# Patient Record
Sex: Male | Born: 1955
Health system: Southern US, Community
[De-identification: ages and names within clinical notes are randomized; demographics above are authoritative.]

## PROBLEM LIST (undated history)

## (undated) DIAGNOSIS — N2889 Other specified disorders of kidney and ureter: Secondary | ICD-10-CM

## (undated) DIAGNOSIS — Z950 Presence of cardiac pacemaker: Secondary | ICD-10-CM

## (undated) DIAGNOSIS — G250 Essential tremor: Secondary | ICD-10-CM

## (undated) DIAGNOSIS — K219 Gastro-esophageal reflux disease without esophagitis: Secondary | ICD-10-CM

## (undated) DIAGNOSIS — I495 Sick sinus syndrome: Secondary | ICD-10-CM

## (undated) DIAGNOSIS — I7143 Infrarenal abdominal aortic aneurysm, without rupture: Secondary | ICD-10-CM

## (undated) DIAGNOSIS — G709 Myoneural disorder, unspecified: Secondary | ICD-10-CM

## (undated) DIAGNOSIS — I4719 Other supraventricular tachycardia: Secondary | ICD-10-CM

## (undated) DIAGNOSIS — Z87440 Personal history of urinary (tract) infections: Secondary | ICD-10-CM

## (undated) DIAGNOSIS — Z87898 Personal history of other specified conditions: Secondary | ICD-10-CM

## (undated) DIAGNOSIS — F5104 Psychophysiologic insomnia: Secondary | ICD-10-CM

## (undated) DIAGNOSIS — H409 Unspecified glaucoma: Secondary | ICD-10-CM

## (undated) DIAGNOSIS — J189 Pneumonia, unspecified organism: Secondary | ICD-10-CM

## (undated) DIAGNOSIS — R531 Weakness: Secondary | ICD-10-CM

## (undated) DIAGNOSIS — J439 Emphysema, unspecified: Secondary | ICD-10-CM

## (undated) DIAGNOSIS — E785 Hyperlipidemia, unspecified: Secondary | ICD-10-CM

## (undated) DIAGNOSIS — I2510 Atherosclerotic heart disease of native coronary artery without angina pectoris: Secondary | ICD-10-CM

## (undated) DIAGNOSIS — N401 Enlarged prostate with lower urinary tract symptoms: Secondary | ICD-10-CM

## (undated) DIAGNOSIS — E119 Type 2 diabetes mellitus without complications: Secondary | ICD-10-CM

## (undated) DIAGNOSIS — R001 Bradycardia, unspecified: Secondary | ICD-10-CM

## (undated) DIAGNOSIS — R0602 Shortness of breath: Secondary | ICD-10-CM

## (undated) DIAGNOSIS — F1910 Other psychoactive substance abuse, uncomplicated: Secondary | ICD-10-CM

## (undated) DIAGNOSIS — R569 Unspecified convulsions: Secondary | ICD-10-CM

## (undated) DIAGNOSIS — R55 Syncope and collapse: Secondary | ICD-10-CM

## (undated) DIAGNOSIS — I10 Essential (primary) hypertension: Secondary | ICD-10-CM

## (undated) DIAGNOSIS — Z974 Presence of external hearing-aid: Secondary | ICD-10-CM

## (undated) DIAGNOSIS — H348192 Central retinal vein occlusion, unspecified eye, stable: Secondary | ICD-10-CM

## (undated) DIAGNOSIS — F419 Anxiety disorder, unspecified: Secondary | ICD-10-CM

## (undated) DIAGNOSIS — F0390 Unspecified dementia without behavioral disturbance: Secondary | ICD-10-CM

## (undated) DIAGNOSIS — K5730 Diverticulosis of large intestine without perforation or abscess without bleeding: Secondary | ICD-10-CM

## (undated) DIAGNOSIS — I471 Supraventricular tachycardia: Secondary | ICD-10-CM

## (undated) DIAGNOSIS — Z8719 Personal history of other diseases of the digestive system: Secondary | ICD-10-CM

## (undated) DIAGNOSIS — I714 Abdominal aortic aneurysm, without rupture: Secondary | ICD-10-CM

## (undated) DIAGNOSIS — Z8739 Personal history of other diseases of the musculoskeletal system and connective tissue: Secondary | ICD-10-CM

## (undated) DIAGNOSIS — R319 Hematuria, unspecified: Secondary | ICD-10-CM

## (undated) DIAGNOSIS — A419 Sepsis, unspecified organism: Secondary | ICD-10-CM

## (undated) DIAGNOSIS — Z973 Presence of spectacles and contact lenses: Secondary | ICD-10-CM

## (undated) DIAGNOSIS — I219 Acute myocardial infarction, unspecified: Secondary | ICD-10-CM

## (undated) DIAGNOSIS — F32A Depression, unspecified: Secondary | ICD-10-CM

## (undated) DIAGNOSIS — G4730 Sleep apnea, unspecified: Secondary | ICD-10-CM

## (undated) DIAGNOSIS — M1990 Unspecified osteoarthritis, unspecified site: Secondary | ICD-10-CM

## (undated) DIAGNOSIS — F329 Major depressive disorder, single episode, unspecified: Secondary | ICD-10-CM

## (undated) DIAGNOSIS — Z8611 Personal history of tuberculosis: Secondary | ICD-10-CM

## (undated) DIAGNOSIS — A159 Respiratory tuberculosis unspecified: Secondary | ICD-10-CM

## (undated) DIAGNOSIS — C652 Malignant neoplasm of left renal pelvis: Secondary | ICD-10-CM

## (undated) DIAGNOSIS — I6930 Unspecified sequelae of cerebral infarction: Secondary | ICD-10-CM

## (undated) DIAGNOSIS — I639 Cerebral infarction, unspecified: Secondary | ICD-10-CM

## (undated) DIAGNOSIS — R339 Retention of urine, unspecified: Secondary | ICD-10-CM

## (undated) DIAGNOSIS — C801 Malignant (primary) neoplasm, unspecified: Secondary | ICD-10-CM

## (undated) HISTORY — DX: Psychophysiologic insomnia: F51.04

## (undated) HISTORY — PX: ABDOMINAL EXPLORATION SURGERY: SHX538

## (undated) HISTORY — DX: Essential tremor: G25.0

## (undated) HISTORY — PX: CATARACT EXTRACTION W/ INTRAOCULAR LENS  IMPLANT, BILATERAL: SHX1307

## (undated) HISTORY — PX: MULTIPLE TOOTH EXTRACTIONS: SHX2053

## (undated) HISTORY — PX: OTHER SURGICAL HISTORY: SHX169

## (undated) HISTORY — DX: Hyperlipidemia, unspecified: E78.5

## (undated) HISTORY — DX: Respiratory tuberculosis unspecified: A15.9

## (undated) HISTORY — PX: LUNG SURGERY: SHX703

## (undated) HISTORY — PX: CARDIAC CATHETERIZATION: SHX172

## (undated) HISTORY — DX: Acute myocardial infarction, unspecified: I21.9

## (undated) HISTORY — PX: CARDIAC PACEMAKER PLACEMENT: SHX583

## (undated) HISTORY — DX: Shortness of breath: R06.02

## (undated) HISTORY — DX: Presence of cardiac pacemaker: Z95.0

---
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
## 2000-01-19 ENCOUNTER — Emergency Department (HOSPITAL_COMMUNITY): Admission: EM | Admit: 2000-01-19 | Discharge: 2000-01-19 | Payer: Self-pay | Admitting: Emergency Medicine

## 2000-01-19 ENCOUNTER — Encounter: Payer: Self-pay | Admitting: Emergency Medicine

## 2013-12-15 DIAGNOSIS — I219 Acute myocardial infarction, unspecified: Secondary | ICD-10-CM

## 2013-12-15 HISTORY — DX: Acute myocardial infarction, unspecified: I21.9

## 2014-12-11 DIAGNOSIS — Z950 Presence of cardiac pacemaker: Secondary | ICD-10-CM

## 2014-12-11 HISTORY — DX: Presence of cardiac pacemaker: Z95.0

## 2015-10-01 ENCOUNTER — Emergency Department (HOSPITAL_COMMUNITY): Payer: Self-pay

## 2015-10-01 ENCOUNTER — Emergency Department (HOSPITAL_COMMUNITY)
Admission: EM | Admit: 2015-10-01 | Discharge: 2015-10-02 | Disposition: A | Discharge status: Home / Self Care | Payer: Self-pay | Attending: Emergency Medicine | Admitting: Emergency Medicine

## 2015-10-01 ENCOUNTER — Encounter (HOSPITAL_COMMUNITY): Payer: Self-pay | Admitting: Emergency Medicine

## 2015-10-01 DIAGNOSIS — R079 Chest pain, unspecified: Secondary | ICD-10-CM | POA: Insufficient documentation

## 2015-10-01 DIAGNOSIS — Z880 Allergy status to penicillin: Secondary | ICD-10-CM | POA: Insufficient documentation

## 2015-10-01 DIAGNOSIS — Z720 Tobacco use: Secondary | ICD-10-CM | POA: Insufficient documentation

## 2015-10-01 DIAGNOSIS — J441 Chronic obstructive pulmonary disease with (acute) exacerbation: Secondary | ICD-10-CM | POA: Insufficient documentation

## 2015-10-01 DIAGNOSIS — I10 Essential (primary) hypertension: Secondary | ICD-10-CM | POA: Insufficient documentation

## 2015-10-01 DIAGNOSIS — H538 Other visual disturbances: Secondary | ICD-10-CM | POA: Insufficient documentation

## 2015-10-01 DIAGNOSIS — Z950 Presence of cardiac pacemaker: Secondary | ICD-10-CM | POA: Insufficient documentation

## 2015-10-01 DIAGNOSIS — Z9889 Other specified postprocedural states: Secondary | ICD-10-CM | POA: Insufficient documentation

## 2015-10-01 DIAGNOSIS — Z7982 Long term (current) use of aspirin: Secondary | ICD-10-CM | POA: Insufficient documentation

## 2015-10-01 HISTORY — DX: Essential (primary) hypertension: I10

## 2015-10-01 LAB — I-STAT TROPONIN, ED
TROPONIN I, POC: 0 ng/mL (ref 0.00–0.08)
TROPONIN I, POC: 0 ng/mL (ref 0.00–0.08)

## 2015-10-01 LAB — BASIC METABOLIC PANEL
ANION GAP: 8 (ref 5–15)
BUN: 14 mg/dL (ref 6–20)
CALCIUM: 9.4 mg/dL (ref 8.9–10.3)
CO2: 26 mmol/L (ref 22–32)
Chloride: 105 mmol/L (ref 101–111)
Creatinine, Ser: 0.74 mg/dL (ref 0.61–1.24)
GFR calc Af Amer: >60 mL/min (ref >60)
GLUCOSE: 128 mg/dL — AB (ref 65–99)
Potassium: 3.9 mmol/L (ref 3.5–5.1)
Sodium: 139 mmol/L (ref 135–145)

## 2015-10-01 LAB — CBC
HEMATOCRIT: 47.7 % (ref 39.0–52.0)
HEMOGLOBIN: 15.5 g/dL (ref 13.0–17.0)
MCH: 28.9 pg (ref 26.0–34.0)
MCHC: 32.5 g/dL (ref 30.0–36.0)
MCV: 89 fL (ref 78.0–100.0)
Platelets: 189 10*3/uL (ref 150–400)
RBC: 5.36 MIL/uL (ref 4.22–5.81)
RDW: 14.2 % (ref 11.5–15.5)
WBC: 5.9 10*3/uL (ref 4.0–10.5)

## 2015-10-01 LAB — D-DIMER, QUANTITATIVE: D-Dimer, Quant: <0.27 ug/mL-FEU (ref 0.00–0.48)

## 2015-10-01 MED ORDER — LISINOPRIL 20 MG PO TABS: 20.0000 mg | ORAL_TABLET | Freq: Two times a day (BID) | ORAL | Status: DC

## 2015-10-01 MED ORDER — AMLODIPINE BESYLATE 5 MG PO TABS: 5.0000 mg | ORAL_TABLET | Freq: Every day | ORAL | Status: DC

## 2015-10-01 MED ORDER — ALBUTEROL SULFATE HFA 108 (90 BASE) MCG/ACT IN AERS
2.0000 | INHALATION_SPRAY | RESPIRATORY_TRACT | Status: DC | PRN
  Administered 2015-10-01: 2 via RESPIRATORY_TRACT
  Filled 2015-10-01: qty 6.7

## 2015-10-01 NOTE — ED Notes (Signed)
Pt reports sharp intermittent L sided cp for several days that makes his L arm feel weak. Also c.o sob, and bilateral blurred vision.

## 2015-10-01 NOTE — ED Provider Notes (Signed)
CSN: [id number]     Arrival date & time 10/01/15  1919 History   First MD Initiated Contact with Patient 10/01/15 2201     Chief Complaint  Patient presents with  . Chest Pain     (Consider location/radiation/quality/duration/timing/severity/associated sxs/prior Treatment) HPI Comments: Patient presents with chest pain. He has a history of hypertension and COPD. He smokes cigarettes intermittently. He recently moved from [place] about a week ago. He reports that he's had some intermittent sharp pain to his left chest for the last 3-4 days. At times it radiates to his left arm. He has some intermittent blurry vision associated with the pain. He states the sharp pain last about 5-10 minutes. He occasionally gets short of breath with it. It does not seem to be exertional. He traveled former [place] be a train. He denies any pain or swelling in his legs. He states that he ran out of his medications a few days ago and that's when the symptoms seemed to start. He takes Norvasc, lisinopril and an albuterol inhaler as well as baby aspirin at home. He states that he was recently admitted within the last 2 weeks to [facility] where he had a cardiac catheterization that he states was normal. He could not tell me why he had the catheterization. He does have a pacemaker in place that was placed in December of last year. He states the pacemaker placement was complicated with a buildup of blood around his lung resulting in a chest tube. He's not had any ongoing problems since that time.  Patient is a 59 y.o. male presenting with chest pain.  Chest Pain Associated symptoms: shortness of breath   Associated symptoms: no abdominal pain, no back pain, no cough, no diaphoresis, no dizziness, no fatigue, no fever, no headache, no nausea, no numbness, not vomiting and no weakness     Past Medical History  Diagnosis Date  . Hypertension    Past Surgical History  Procedure Laterality Date  .  Pacemaker insertion     No family history on file. Social History  Substance Use Topics  . Smoking status: Current Some Day Smoker  . Smokeless tobacco: None  . Alcohol Use: No    Review of Systems  Constitutional: Negative for fever, chills, diaphoresis and fatigue.  HENT: Negative for congestion, rhinorrhea and sneezing.   Eyes: Positive for visual disturbance.  Respiratory: Positive for shortness of breath. Negative for cough and chest tightness.   Cardiovascular: Positive for chest pain. Negative for leg swelling.  Gastrointestinal: Negative for nausea, vomiting, abdominal pain, diarrhea and blood in stool.  Genitourinary: Negative for frequency, hematuria, flank pain and difficulty urinating.  Musculoskeletal: Negative for back pain and arthralgias.  Skin: Negative for rash.  Neurological: Negative for dizziness, speech difficulty, weakness, numbness and headaches.      Allergies  Penicillins  Home Medications   Prior to Admission medications   Medication Sig Start Date End Date Taking? Authorizing Provider  albuterol (PROVENTIL HFA;VENTOLIN HFA) 108 (90 BASE) MCG/ACT inhaler Inhale 2 puffs into the lungs every 6 (six) hours as needed for wheezing or shortness of breath.    Historical Provider, MD  amLODipine (NORVASC) 5 MG tablet Take 1 tablet (5 mg total) by mouth daily. 10/01/15   Malvin Johns, MD  aspirin 81 MG tablet Take 81 mg by mouth daily.    Historical Provider, MD  lisinopril (PRINIVIL,ZESTRIL) 20 MG tablet Take 1 tablet (20 mg total) by mouth 2 (two) times daily. 10/01/15  Malvin Johns, MD   BP 145/70 mmHg  Pulse 59  Temp(Src) 98 F (36.7 C) (Oral)  Resp 16  Ht 6\' 1"  (1.854 m)  Wt 173 lb (78.472 kg)  BMI 22.83 kg/m2  SpO2 99% Physical Exam  Constitutional: He is oriented to person, place, and time. He appears well-developed and well-nourished.  HENT:  Head: Normocephalic and atraumatic.  Eyes: Pupils are equal, round, and reactive to light.   Neck: Normal range of motion. Neck supple.  Cardiovascular: Normal rate, regular rhythm and normal heart sounds.   Pulmonary/Chest: Effort normal and breath sounds normal. No respiratory distress. He has no wheezes. He has no rales. He exhibits no tenderness.  Abdominal: Soft. Bowel sounds are normal. There is no tenderness. There is no rebound and no guarding.  Musculoskeletal: Normal range of motion. He exhibits no edema.  No edema or calf tenderness  Lymphadenopathy:    He has no cervical adenopathy.  Neurological: He is alert and oriented to person, place, and time.  Skin: Skin is warm and dry. No rash noted.  Psychiatric: He has a normal mood and affect.    ED Course  Procedures (including critical care time) Labs Review Labs Reviewed  BASIC METABOLIC PANEL - Abnormal; Notable for the following:    Glucose, Bld 128 (*)    All other components within normal limits  CBC  D-DIMER, QUANTITATIVE (NOT AT [facility])  Randolm Idol, ED  Randolm Idol, ED    Imaging Review Dg Chest 2 View  10/01/2015  CLINICAL DATA:  Initial evaluation for acute left-sided chest pain. EXAM: CHEST  2 VIEW COMPARISON:  None. FINDINGS: Left-sided pacemaker/ AICD in place with electrodes overlying the right atrium and right ventricle. Heart size is normal. Mediastinal silhouette within normal limits. Lungs are normally inflated. 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 No pneumothorax. No airspace consolidation, pulmonary edema, or pleural effusion. No acute osseus abnormality. IMPRESSION: 1. No active cardiopulmonary disease. 2. Bullous change at the right lung apex. Electronically Signed   By: Jeannine Boga M.D.   On: 10/01/2015 21:08   I have personally reviewed and evaluated these images and lab results as part of my medical decision-making.   EKG Interpretation   Date/Time:  Monday October 01 2015 19:24:57 EDT Ventricular Rate:  60 PR Interval:  164 QRS Duration:  90 QT Interval:  408 QTC Calculation: 408 R Axis:   77 Text Interpretation:  Atrial-paced rhythm Left ventricular hypertrophy  Abnormal ECG No old tracing to compare Confirmed by Toniya Rozar  MD, Abdi Husak  ([id number]) on 10/01/2015 10:46:22 PM      MDM   Final diagnoses:  Chest pain, unspecified chest pain type    Patient presents with sharp intermittent chest pains. His EKG is without ischemic changes. He's had a troponin which was negative. This was actually drawn several hours after he arrived in the ED so I don't feel that he needs a second troponin. His d-dimer is negative and he has no other suggestions of pulmonary embolus. He states he had a normal cardiac catheterization within the last 2 weeks. We attempted to obtain records from [facility] but as of yet have been unsuccessful. Patient is discharged home in good condition. He was given refills on his medications. He was advised to follow-up with a PCP as soon as possible. He was given referral to [place]. Return precautions were given.    Malvin Johns, MD 10/01/15 [phone]

## 2015-10-01 NOTE — Discharge Instructions (Signed)
Nonspecific Chest Pain  °Chest pain can be caused by many different conditions. There is always a chance that your pain could be related to something serious, such as a heart attack or a blood clot in your lungs. Chest pain can also be caused by conditions that are not life-threatening. If you have chest pain, it is very important to follow up with your health care provider. °CAUSES  °Chest pain can be caused by: °· Heartburn. °· Pneumonia or bronchitis. °· Anxiety or stress. °· Inflammation around your heart (pericarditis) or lung (pleuritis or pleurisy). °· A blood clot in your lung. °· A collapsed lung (pneumothorax). It can develop suddenly on its own (spontaneous pneumothorax) or from trauma to the chest. °· Shingles infection (varicella-zoster virus). °· Heart attack. °· Damage to the bones, muscles, and cartilage that make up your chest wall. This can include: °¨ Bruised bones due to injury. °¨ Strained muscles or cartilage due to frequent or repeated coughing or overwork. °¨ Fracture to one or more ribs. °¨ Sore cartilage due to inflammation (costochondritis). °RISK FACTORS  °Risk factors for chest pain may include: °· Activities that increase your risk for trauma or injury to your chest. °· Respiratory infections or conditions that cause frequent coughing. °· Medical conditions or overeating that can cause heartburn. °· Heart disease or family history of heart disease. °· Conditions or health behaviors that increase your risk of developing a blood clot. °· Having had chicken pox (varicella zoster). °SIGNS AND SYMPTOMS °Chest pain can feel like: °· Burning or tingling on the surface of your chest or deep in your chest. °· Crushing, pressure, aching, or squeezing pain. °· Dull or sharp pain that is worse when you move, cough, or take a deep breath. °· Pain that is also felt in your back, neck, shoulder, or arm, or pain that spreads to any of these areas. °Your chest pain may come and go, or it may stay  constant. °DIAGNOSIS °Lab tests or other studies may be needed to find the cause of your pain. Your health care provider may have you take a test called an ambulatory ECG (electrocardiogram). An ECG records your heartbeat patterns at the time the test is performed. You may also have other tests, such as: °· Transthoracic echocardiogram (TTE). During echocardiography, sound waves are used to create a picture of all of the heart structures and to look at how blood flows through your heart. °· Transesophageal echocardiogram (TEE). This is a more advanced imaging test that obtains images from inside your body. It allows your health care provider to see your heart in finer detail. °· Cardiac monitoring. This allows your health care provider to monitor your heart rate and rhythm in real time. °· Holter monitor. This is a portable device that records your heartbeat and can help to diagnose abnormal heartbeats. It allows your health care provider to track your heart activity for several days, if needed. °· Stress tests. These can be done through exercise or by taking medicine that makes your heart beat more quickly. °· Blood tests. °· Imaging tests. °TREATMENT  °Your treatment depends on what is causing your chest pain. Treatment may include: °· Medicines. These may include: °¨ Acid blockers for heartburn. °¨ Anti-inflammatory medicine. °¨ Pain medicine for inflammatory conditions. °¨ Antibiotic medicine, if an infection is present. °¨ Medicines to dissolve blood clots. °¨ Medicines to treat coronary artery disease. °· Supportive care for conditions that do not require medicines. This may include: °¨ Resting. °¨ Applying heat   or cold packs to injured areas. °¨ Limiting activities until pain decreases. °HOME CARE INSTRUCTIONS °· If you were prescribed an antibiotic medicine, finish it all even if you start to feel better. °· Avoid any activities that bring on chest pain. °· Do not use any tobacco products, including  cigarettes, chewing tobacco, or electronic cigarettes. If you need help quitting, ask your health care provider. °· Do not drink alcohol. °· Take medicines only as directed by your health care provider. °· Keep all follow-up visits as directed by your health care provider. This is important. This includes any further testing if your chest pain does not go away. °· If heartburn is the cause for your chest pain, you may be told to keep your head raised (elevated) while sleeping. This reduces the chance that acid will go from your stomach into your esophagus. °· Make lifestyle changes as directed by your health care provider. These may include: °¨ Getting regular exercise. Ask your health care provider to suggest some activities that are safe for you. °¨ Eating a heart-healthy diet. A registered dietitian can help you to learn healthy eating options. °¨ Maintaining a healthy weight. °¨ Managing diabetes, if necessary. °¨ Reducing stress. °SEEK MEDICAL CARE IF: °· Your chest pain does not go away after treatment. °· You have a rash with blisters on your chest. °· You have a fever. °SEEK IMMEDIATE MEDICAL CARE IF:  °· Your chest pain is worse. °· You have an increasing cough, or you cough up blood. °· You have severe abdominal pain. °· You have severe weakness. °· You faint. °· You have chills. °· You have sudden, unexplained chest discomfort. °· You have sudden, unexplained discomfort in your arms, back, neck, or jaw. °· You have shortness of breath at any time. °· You suddenly start to sweat, or your skin gets clammy. °· You feel nauseous or you vomit. °· You suddenly feel light-headed or dizzy. °· Your heart begins to beat quickly, or it feels like it is skipping beats. °These symptoms may represent a serious problem that is an emergency. Do not wait to see if the symptoms will go away. Get medical help right away. Call your local emergency services (911 in the [place] Do not drive yourself to the hospital. °  °This  information is not intended to replace advice given to you by your health care provider. Make sure you discuss any questions you have with your health care provider. °  °Document Released: 09/10/2005 Document Revised: 12/22/2014 Document Reviewed: 07/07/2014 °Elsevier Interactive Patient Education ©2016 [employer] ° °

## 2015-10-01 NOTE — ED Notes (Signed)
Discharge instructions/prescriptions reviewed with patient. Understanding verbalized. Patient declined wheelchair at time of discharge. Denies pain. No distress noted at time of discharge.

## 2015-10-04 ENCOUNTER — Encounter: Payer: Self-pay | Admitting: Family Medicine

## 2015-10-04 ENCOUNTER — Ambulatory Visit: Payer: Medicaid Other | Attending: Family Medicine | Admitting: Family Medicine

## 2015-10-04 VITALS — BP 146/92 | HR 61 | Temp 98.3°F | Resp 18 | Ht 73.5 in | Wt 171.0 lb

## 2015-10-04 DIAGNOSIS — Z880 Allergy status to penicillin: Secondary | ICD-10-CM | POA: Insufficient documentation

## 2015-10-04 DIAGNOSIS — Z79899 Other long term (current) drug therapy: Secondary | ICD-10-CM | POA: Diagnosis not present

## 2015-10-04 DIAGNOSIS — R079 Chest pain, unspecified: Secondary | ICD-10-CM | POA: Diagnosis present

## 2015-10-04 DIAGNOSIS — R3911 Hesitancy of micturition: Secondary | ICD-10-CM | POA: Diagnosis not present

## 2015-10-04 DIAGNOSIS — Z7982 Long term (current) use of aspirin: Secondary | ICD-10-CM | POA: Diagnosis not present

## 2015-10-04 DIAGNOSIS — I10 Essential (primary) hypertension: Secondary | ICD-10-CM | POA: Insufficient documentation

## 2015-10-04 DIAGNOSIS — Z23 Encounter for immunization: Secondary | ICD-10-CM | POA: Diagnosis not present

## 2015-10-04 DIAGNOSIS — Z0000 Encounter for general adult medical examination without abnormal findings: Secondary | ICD-10-CM | POA: Diagnosis not present

## 2015-10-04 DIAGNOSIS — F17200 Nicotine dependence, unspecified, uncomplicated: Secondary | ICD-10-CM | POA: Insufficient documentation

## 2015-10-04 DIAGNOSIS — Z950 Presence of cardiac pacemaker: Secondary | ICD-10-CM | POA: Diagnosis not present

## 2015-10-04 DIAGNOSIS — F17210 Nicotine dependence, cigarettes, uncomplicated: Secondary | ICD-10-CM | POA: Diagnosis not present

## 2015-10-04 DIAGNOSIS — J449 Chronic obstructive pulmonary disease, unspecified: Secondary | ICD-10-CM | POA: Insufficient documentation

## 2015-10-04 MED ORDER — AMLODIPINE BESYLATE 5 MG PO TABS: 5.0000 mg | ORAL_TABLET | Freq: Every day | ORAL | Status: DC

## 2015-10-04 MED ORDER — LISINOPRIL 20 MG PO TABS: 20.0000 mg | ORAL_TABLET | Freq: Two times a day (BID) | ORAL | Status: DC

## 2015-10-04 MED ORDER — ASPIRIN 81 MG PO TABS: 81.0000 mg | ORAL_TABLET | Freq: Every day | ORAL | Status: DC

## 2015-10-04 NOTE — Progress Notes (Signed)
Pt's here ED f/up for chest pain, his pace maker, and HTN.   Pt also wants to esc.care with PCP.  Pt needs referral to opthamology, GI, and Otologist.   Pt has not taken meds today due to financial hardship.  Pt requested flu vaccine today.  Pt waited 10 mins w/o no adverse reaction to injection.

## 2015-10-04 NOTE — Progress Notes (Signed)
CC: ED follow-up for chest pain.  HPI: Wesley [name] is a 59 y.o. male with a history of COPD, hypertension, history of pacemaker placement in 11/2014 in [place] who comes in here for an ED follow-up from 10/01/15 after he had presented with chest pains and had admitted to running out of his medications.  His workup at the ED included  troponin which came back negative, EKG was negative for evidence of ischemia, he had normal d-dimer, chest x-ray revealed no active cardiopulmonary disease, bullous change at the right lung apex. The patient had also informed the ED physician that he had a recent cardiac cath while in [place] which was normal as per patient but indication was not known but postop course was complicated with ?blood around his lung necessitating chest tube placement. ED attempts at obtaining previous records were unsuccessful and he was discharged to follow-up with the clinic here.  Interval history: Today he informs me he has no chest pain but is yet to obtain his medications due to financial constraints. He complains of urinary hesitancy and straining but denies frequency and nocturia. He had also informed the nurse he needed a GI referral and on further questioning he tells me he was previously constipated anastomosis bowels well. He also has visual problems and would like to be referred to an ophthalmologist.   Patient has No headache, No chest pain, No abdominal pain - No Nausea, No new weakness tingling or numbness, No Cough - SOB.  Allergies  Allergen Reactions  . Penicillins Swelling   Past Medical History  Diagnosis Date  . Hypertension    Current Outpatient Prescriptions on File Prior to Visit  Medication Sig Dispense Refill  . albuterol (PROVENTIL HFA;VENTOLIN HFA) 108 (90 BASE) MCG/ACT inhaler Inhale 2 puffs into the lungs every 6 (six) hours as needed for wheezing or shortness of breath.    Marland Kitchen amLODipine (NORVASC) 5 MG tablet Take 1 tablet (5 mg total)  by mouth daily. 30 tablet 0  . aspirin 81 MG tablet Take 81 mg by mouth daily.    Marland Kitchen lisinopril (PRINIVIL,ZESTRIL) 20 MG tablet Take 1 tablet (20 mg total) by mouth 2 (two) times daily. 60 tablet 0   No current facility-administered medications on file prior to visit.   Family History  Problem Relation Age of Onset  . Cancer Mother   . Cancer Father    Social History   Social History  . Marital Status: Married    Spouse Name: N/A  . Number of Children: N/A  . Years of Education: N/A   Occupational History  . Not on file.   Social History Main Topics  . Smoking status: Current Some Day Smoker -- 0.50 packs/day    Types: Cigarettes  . Smokeless tobacco: Not on file  . Alcohol Use: No  . Drug Use: No  . Sexual Activity: Not on file   Other Topics Concern  . Not on file   Social History Narrative    Review of Systems: Constitutional: Negative for fever, chills, diaphoresis, activity change, appetite change and fatigue. HENT: Negative for ear pain, nosebleeds, congestion, facial swelling, rhinorrhea, neck pain, neck stiffness and ear discharge.  Eyes: Negative for pain, discharge, redness, itching and positive for visual disturbance. Respiratory: Negative for cough, choking, chest tightness, shortness of breath, wheezing and stridor.  Cardiovascular: Negative for chest pain, palpitations and leg swelling. Gastrointestinal: Negative for abdominal distention. Genitourinary:see hpi  Musculoskeletal: Negative for back pain, joint swelling, arthralgias and gait problem.  Neurological: Negative for dizziness, tremors, seizures, syncope, facial asymmetry, speech difficulty, weakness, light-headedness, numbness and headaches.  Hematological: Negative for adenopathy. Does not bruise/bleed easily. Psychiatric/Behavioral: Negative for hallucinations, behavioral problems, confusion, dysphoric mood, decreased concentration and agitation.    Objective:   Filed Vitals:   10/04/15 0935    BP: 146/92  Pulse: 61  Temp: 98.3 F (36.8 C)  Resp: 18    Physical Exam: Constitutional: Patient appears well-developed and well-nourished. No distress. HENT: Normocephalic, atraumatic, External right and left ear normal. Oropharynx is clear and moist.  Eyes: Conjunctivae and EOM are normal. PERRLA, no scleral icterus. Neck: Normal ROM. Neck supple. No JVD. No tracheal deviation. No thyromegaly. CVS: RRR, S1/S2 +, no murmurs, no gallops, no carotid bruit; pacemaker in left upper chest wall. Pulmonary: Effort and breath sounds normal, no stridor, rhonchi, wheezes, rales.  Abdominal: Soft. BS +,  no distension, tenderness, rebound or guarding.  Musculoskeletal: Normal range of motion. No edema and no tenderness.  Lymphadenopathy: No lymphadenopathy noted, cervical, inguinal or axillary Neuro: Alert. Normal reflexes, muscle tone coordination. No cranial nerve deficit. Skin: Skin is warm and dry. No rash noted. Not diaphoretic. No erythema. No pallor. Psychiatric: Normal mood and affect. Behavior, judgment, thought content normal.  Lab Results  Component Value Date   WBC 5.9 10/01/2015   HGB 15.5 10/01/2015   HCT 47.7 10/01/2015   MCV 89.0 10/01/2015   PLT 189 10/01/2015   Lab Results  Component Value Date   CREATININE 0.74 10/01/2015   BUN 14 10/01/2015   NA 139 10/01/2015   K 3.9 10/01/2015   CL 105 10/01/2015   CO2 26 10/01/2015       Assessment and plan:  Hypertension: Uncontrolled (goal is <140/90) due to missing dose of antihypertensives He does have financial constraints and so I have sent his medications to the pharmacy on site. Compliance emphasized as well as low-sodium, DASH diet.  Status post pacemaker placement: He does not have a current cardiologist and so I will be referring him to Dr. wall for optimization of management. I am requesting records from [place], [place] as we have no detailed previous medical history and the patient is unsure if he has a  history of CHF or coronary artery disease. I am holding off on beta blocker due to his heart rate being on the low side of normal.  Urinary hesitancy: I am suspecting benign prostatic hyperplasia but was send off a PSA to exclude malignancy and if normal will proceed to place him on Flomax.  COPD: Controlled on just Proventil HFA no frequent exacerbations.  Tobacco abuse: Smoking cessation support: smoking cessation hotline: 1-800-QUIT-NOW.  Smoking cessation classes are available through [facility]. Call [phone] or visit our website at [email or web address]  Spent 4 minutes counseling on smoking cessation and patient is working on quitting    I have informed him to work on the process of his [employer] card and [employer] as this will facilitate referral to the specialist he is requesting to see.  Arnoldo Morale, [place] [facility] [phone] 10/04/2015, 10:06 AM

## 2015-10-04 NOTE — Patient Instructions (Signed)
Hypertension Hypertension, commonly called high blood pressure, is when the force of blood pumping through your arteries is too strong. Your arteries are the blood vessels that carry blood from your heart throughout your body. A blood pressure reading consists of a higher number over a lower number, such as 110/72. The higher number (systolic) is the pressure inside your arteries when your heart pumps. The lower number (diastolic) is the pressure inside your arteries when your heart relaxes. Ideally you want your blood pressure below 120/80. Hypertension forces your heart to work harder to pump blood. Your arteries may become narrow or stiff. Having untreated or uncontrolled hypertension can cause heart attack, stroke, kidney disease, and other problems. RISK FACTORS Some risk factors for high blood pressure are controllable. Others are not.  Risk factors you cannot control include:   Race. You may be at higher risk if you are African American.  Age. Risk increases with age.  Gender. Men are at higher risk than women before age 45 years. After age 65, women are at higher risk than men. Risk factors you can control include:  Not getting enough exercise or physical activity.  Being overweight.  Getting too much fat, sugar, calories, or salt in your diet.  Drinking too much alcohol. SIGNS AND SYMPTOMS Hypertension does not usually cause signs or symptoms. Extremely high blood pressure (hypertensive crisis) may cause headache, anxiety, shortness of breath, and nosebleed. DIAGNOSIS To check if you have hypertension, your health care provider will measure your blood pressure while you are seated, with your arm held at the level of your heart. It should be measured at least twice using the same arm. Certain conditions can cause a difference in blood pressure between your right and left arms. A blood pressure reading that is higher than normal on one occasion does not mean that you need treatment. If  it is not clear whether you have high blood pressure, you may be asked to return on a different day to have your blood pressure checked again. Or, you may be asked to monitor your blood pressure at home for 1 or more weeks. TREATMENT Treating high blood pressure includes making lifestyle changes and possibly taking medicine. Living a healthy lifestyle can help lower high blood pressure. You may need to change some of your habits. Lifestyle changes may include:  Following the DASH diet. This diet is high in fruits, vegetables, and whole grains. It is low in salt, red meat, and added sugars.  Keep your sodium intake below 2,300 mg per day.  Getting at least 30-45 minutes of aerobic exercise at least 4 times per week.  Losing weight if necessary.  Not smoking.  Limiting alcoholic beverages.  Learning ways to reduce stress. Your health care provider may prescribe medicine if lifestyle changes are not enough to get your blood pressure under control, and if one of the following is true:  You are 18-59 years of age and your systolic blood pressure is above 140.  You are 60 years of age or older, and your systolic blood pressure is above 150.  Your diastolic blood pressure is above 90.  You have diabetes, and your systolic blood pressure is over 140 or your diastolic blood pressure is over 90.  You have kidney disease and your blood pressure is above 140/90.  You have heart disease and your blood pressure is above 140/90. Your personal target blood pressure may vary depending on your medical conditions, your age, and other factors. HOME CARE INSTRUCTIONS    Have your blood pressure rechecked as directed by your health care provider.   Take medicines only as directed by your health care provider. Follow the directions carefully. Blood pressure medicines must be taken as prescribed. The medicine does not work as well when you skip doses. Skipping doses also puts you at risk for  problems.  Do not smoke.   Monitor your blood pressure at home as directed by your health care provider. SEEK MEDICAL CARE IF:   You think you are having a reaction to medicines taken.  You have recurrent headaches or feel dizzy.  You have swelling in your ankles.  You have trouble with your vision. SEEK IMMEDIATE MEDICAL CARE IF:  You develop a severe headache or confusion.  You have unusual weakness, numbness, or feel faint.  You have severe chest or abdominal pain.  You vomit repeatedly.  You have trouble breathing. MAKE SURE YOU:   Understand these instructions.  Will watch your condition.  Will get help right away if you are not doing well or get worse.   This information is not intended to replace advice given to you by your health care provider. Make sure you discuss any questions you have with your health care provider.   Document Released: 12/01/2005 Document Revised: 04/17/2015 Document Reviewed: 09/23/2013 Elsevier Interactive Patient Education 2016 Elsevier Inc.  

## 2015-10-05 ENCOUNTER — Other Ambulatory Visit: Payer: Self-pay | Admitting: Family Medicine

## 2015-10-05 LAB — PSA: PSA: 2.9 ng/mL (ref <=4.00)

## 2015-10-05 MED ORDER — TAMSULOSIN HCL 0.4 MG PO CAPS: 0.4000 mg | ORAL_CAPSULE | Freq: Every day | ORAL | Status: DC

## 2015-10-08 ENCOUNTER — Encounter (HOSPITAL_BASED_OUTPATIENT_CLINIC_OR_DEPARTMENT_OTHER): Payer: Self-pay | Admitting: Clinical

## 2015-10-08 ENCOUNTER — Ambulatory Visit: Payer: Self-pay | Attending: Internal Medicine

## 2015-10-08 DIAGNOSIS — Z659 Problem related to unspecified psychosocial circumstances: Secondary | ICD-10-CM

## 2015-10-08 NOTE — Progress Notes (Signed)
ASSESSMENT: Pt currently experiencing psychosocial problems related to life circumstances; he would benefit from supportive counseling and community resources.  Stage of Change: contemplative  PLAN: 1. F/U with behavioral health consultant in as needed 2. Psychiatric Medications: none. 3. Behavioral recommendation(s):   -Continue positive outlook -Consider scheduling [place] appointment with [facility] -Consider [facility] for housing application, as needed  SUBJECTIVE: Pt. referred by financial counseling  for community resources:  Pt. reports the following symptoms/concerns: Pt states that he is attempting to obtain orange card, had Medicaid in [place], is currently receiving food stamps, moved back in with wife 3 weeks ago (was living homeless), uncertain if she will continue to allow him to stay, he feels much stress over recent life changes; he keeps a positive outlook on life, and would like to come back and talk more as "talking about it all helps a lot".  Duration of problem: three weeks Severity: mild/moderat  OBJECTIVE: Orientation & Cognition: Oriented x3. Thought processes normal and appropriate to situation. Mood: appropriate. Affect: appropriate Appearance: appropriate Risk of harm to self or others: no known risk of harm to self or others Substance use: none known Assessments administered: none  Diagnosis: Problems related to psychosocial circumstances CPT Code: Z65.9 -------------------------------------------- Other(s) present in the room: none  Time spent with patient in exam room: 15 minutes

## 2015-10-10 ENCOUNTER — Encounter: Payer: Self-pay | Admitting: Cardiology

## 2015-10-10 ENCOUNTER — Ambulatory Visit: Payer: Medicaid Other | Attending: Cardiology | Admitting: Cardiology

## 2015-10-10 VITALS — BP 153/88 | HR 66 | Temp 97.6°F | Resp 18 | Ht 73.5 in | Wt 173.7 lb

## 2015-10-10 DIAGNOSIS — F17210 Nicotine dependence, cigarettes, uncomplicated: Secondary | ICD-10-CM | POA: Insufficient documentation

## 2015-10-10 DIAGNOSIS — Z880 Allergy status to penicillin: Secondary | ICD-10-CM | POA: Diagnosis not present

## 2015-10-10 DIAGNOSIS — Z7982 Long term (current) use of aspirin: Secondary | ICD-10-CM | POA: Diagnosis not present

## 2015-10-10 DIAGNOSIS — R001 Bradycardia, unspecified: Secondary | ICD-10-CM | POA: Insufficient documentation

## 2015-10-10 DIAGNOSIS — Z950 Presence of cardiac pacemaker: Secondary | ICD-10-CM | POA: Insufficient documentation

## 2015-10-10 DIAGNOSIS — I10 Essential (primary) hypertension: Secondary | ICD-10-CM | POA: Diagnosis not present

## 2015-10-10 DIAGNOSIS — Z79899 Other long term (current) drug therapy: Secondary | ICD-10-CM | POA: Insufficient documentation

## 2015-10-10 MED ORDER — AMLODIPINE BESYLATE 10 MG PO TABS: 10.0000 mg | ORAL_TABLET | Freq: Every day | ORAL | Status: DC

## 2015-10-10 NOTE — Assessment & Plan Note (Signed)
His pacemaker appears to be stable with generator intact in the left subclavicular space. EKG shows atrial pacing. Will arrange for him to have a pacemaker check in the pacer clinic at [facility]. I will see him back in one year.

## 2015-10-10 NOTE — Progress Notes (Signed)
Patient referred by [name] for HTN and optimization of management of pace maker. Patient has had pace maker for about 10 months now. Patient denies any pain today and has taken his medications today and does not need any refills. Patient reports he smokes but has only had about 2 cigarettes in the last four weeks.   Patient is experiencing SOB when he is active. Reports he can only be up and active for about 10 minutes before he has to sit down.   Patient reports some wheezing when he gets tired.   Patient reports chest pain but says it is not stabbing pain. Pain described as something pulling. Pain come and go. Pain comes on when is relaxed and then goes to move.   Patient denies any swelling.
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
## 2015-10-10 NOTE — Patient Instructions (Addendum)
Thank you for visiting with Dr. Verl Blalock today. Discontinue Amlodipine 5 mg (1 tablet a day) today Begin taking Amlodipine 10 mg ( 1 tablet a day) tomorrow

## 2015-10-10 NOTE — Assessment & Plan Note (Signed)
Not a goal. We will increase his amlodipine to 10 mg by mouth every morning.

## 2015-10-11 ENCOUNTER — Telehealth: Payer: Self-pay

## 2015-10-11 NOTE — Telephone Encounter (Signed)
CMA called Wesley [name], Wesley [name] was not available to take my call. A message was left for the Wesley [name] to return my call asap.

## 2015-10-11 NOTE — Telephone Encounter (Signed)
-----   Message from Arnoldo Morale, MD sent at 10/05/2015 11:00 PM EDT ----- PSA is normal and i have sent a script for Flomax to his pharmacy.

## 2015-10-11 NOTE — Telephone Encounter (Signed)
Pt. Returned call. Please f/u with pt. °

## 2015-10-12 ENCOUNTER — Ambulatory Visit: Payer: Self-pay | Attending: Family Medicine

## 2015-10-12 NOTE — Telephone Encounter (Signed)
-----   Message from Arnoldo Morale, MD sent at 10/05/2015 11:00 PM EDT ----- PSA is normal and i have sent a script for Flomax to his pharmacy.

## 2015-10-12 NOTE — Telephone Encounter (Signed)
CMA called pt, pt verified name and DOB. Pt was given lab results and verbalized that he understood with no further questions.

## 2015-10-24 ENCOUNTER — Encounter: Payer: Self-pay | Admitting: Cardiovascular Disease

## 2015-10-24 ENCOUNTER — Ambulatory Visit (INDEPENDENT_AMBULATORY_CARE_PROVIDER_SITE_OTHER): Payer: Medicaid Other | Admitting: Cardiovascular Disease

## 2015-10-24 VITALS — BP 130/60 | HR 60 | Ht 74.0 in | Wt 182.7 lb

## 2015-10-24 DIAGNOSIS — I10 Essential (primary) hypertension: Secondary | ICD-10-CM

## 2015-10-24 DIAGNOSIS — I495 Sick sinus syndrome: Secondary | ICD-10-CM

## 2015-10-24 DIAGNOSIS — Z950 Presence of cardiac pacemaker: Secondary | ICD-10-CM

## 2015-10-24 NOTE — Progress Notes (Signed)
Patient ID: Wesley [name], male   DOB: 1956/03/31, 59 y.o.   MRN: [id number]     Cardiology Office Note   Date:  10/26/2015   ID:  Wesley [name], DOB [birth year], MRN [id number]  PCP:  No PCP Per Patient  Cardiologist:   Sanda Klein, MD   Chief Complaint  Patient presents with  . Numbness    LEFT ARM   . Shaking    R & L HANDS  . Fatigue    IN KNEES  . Cold Exposure    ALWAYS COLD      History of Present Illness: Wesley [name] is a 59 y.o. male who presents for  Establishment of pacemaker follow-up, referred by Dr. Verl Blalock. He recently moved here from [place]. He reports having a "heart attack" earlier this year , "may be in August". She describes having a heart catheterization that did not show any blockages.   he can't really tell me why he received his pacemaker. It is a Sport and exercise psychologist. Jude dual-chamber device implanted in December 2015. All lead parameters her good and the estimated generator longevity is 10.7 years. Atrial pacing occurs 46% of the time and ventricular pacing only occurs 1% of the time. He is aware of the auto capture and this has been turned off. There have been no episodes of high ventricular rates and he had one very short 3 second episode of paroxysmal atrial tachycardia in January 2016, none since. He thinks he has a remote transmitter, but is not entirely sure where it is.   he talks very little and it is hard to get a real review of systems. He does not appear to have a lot of problems with dyspnea , angina, dizziness, edema or claudication. He denies syncope. He has some pain in his left chest which may be associated with sequelae from a hemothorax that complicated his pacemaker insertion.   he has systemic hypertension and his amlodipine was increased to 10 mg by Dr. Verl Blalock on October 26. Today his blood pressure is normal. He filled   he filled out our screening tool for obstructive sleep apnea and scores 24 points on the Epworth Sleepiness Scale, very  strongly suggestive of obstructive sleep apnea  Past Medical History  Diagnosis Date  . Hypertension     Past Surgical History  Procedure Laterality Date  . Pacemaker insertion       Current Outpatient Prescriptions  Medication Sig Dispense Refill  . albuterol (PROVENTIL HFA;VENTOLIN HFA) 108 (90 BASE) MCG/ACT inhaler Inhale 2 puffs into the lungs every 6 (six) hours as needed for wheezing or shortness of breath.    Marland Kitchen amLODipine (NORVASC) 10 MG tablet Take 1 tablet (10 mg total) by mouth daily. 90 tablet 11  . aspirin 81 MG tablet Take 1 tablet (81 mg total) by mouth daily. 30 tablet 2  . lisinopril (PRINIVIL,ZESTRIL) 20 MG tablet Take 1 tablet (20 mg total) by mouth 2 (two) times daily. 60 tablet 2  . tamsulosin (FLOMAX) 0.4 MG CAPS capsule Take 1 capsule (0.4 mg total) by mouth daily. 30 capsule 3   No current facility-administered medications for this visit.    Allergies:   Penicillins    Social History:  The patient  reports that he has been smoking Cigarettes.  He has been smoking about 0.25 packs per day. He does not have any smokeless tobacco history on file. He reports that he does not drink alcohol or use illicit drugs.   Family History:  The  patient's family history includes Cancer in his father and mother.    ROS:  Please see the history of present illness.    Otherwise, review of systems positive for none.   All other systems are reviewed and negative.    PHYSICAL EXAM: VS:  BP 130/60 mmHg  Pulse 60  Ht 6\' 2"  (1.88 m)  Wt 182 lb 11.2 oz (82.872 kg)  BMI 23.45 kg/m2 , BMI Body mass index is 23.45 kg/(m^2).  General: Alert, oriented x3, no distress Head: no evidence of trauma, PERRL, EOMI, no exophtalmos or lid lag, no myxedema, no xanthelasma; normal ears, nose and oropharynx Neck: normal jugular venous pulsations and no hepatojugular reflux; brisk carotid pulses without delay and no carotid bruits Chest: clear to auscultation, no signs of consolidation by  percussion or palpation, normal fremitus, symmetrical and full respiratory excursions,  LC left subclavian pacemaker site Cardiovascular: normal position and quality of the apical impulse, regular rhythm, normal first and second heart sounds, no murmurs, rubs or gallops Abdomen: no tenderness or distention, no masses by palpation, no abnormal pulsatility or arterial bruits, normal bowel sounds, no hepatosplenomegaly Extremities: no clubbing, cyanosis or edema; 2+ radial, ulnar and brachial pulses bilaterally; 2+ right femoral, posterior tibial and dorsalis pedis pulses; 2+ left femoral, posterior tibial and dorsalis pedis pulses; no subclavian or femoral bruits Neurological: grossly nonfocal Psych: euthymic mood, full affect   EKG:  EKG is not ordered today. The  Device interrogation today demonstrates  Atrial paced ventricular sensed rhythm   Recent Labs: 10/01/2015: BUN 14; Creatinine, Ser 0.74; Hemoglobin 15.5; Platelets 189; Potassium 3.9; Sodium 139    Lipid Panel No results found for: CHOL, TRIG, HDL, CHOLHDL, VLDL, LDLCALC, LDLDIRECT    Wt Readings from Last 3 Encounters:  10/24/15 182 lb 11.2 oz (82.872 kg)  10/10/15 173 lb 11.2 oz (78.79 kg)  10/04/15 171 lb (77.565 kg)     ASSESSMENT AND PLAN:  1.  Sinus bradycardia, status post pacemaker implantation. Normal device function. Discussed remote monitoring via the [employer] system in detail.  We'll have automatic downloads every 3 months and office visit yearly  2.  Hypertension well controlled  3.  Probable obstructive sleep apnea.  Despite the fact that he is not obese and that his neck does not appear to be particularly thick and is oropharynx not particularly crowded , his symptoms are quite suggestive of this disorder. This might explain why he needed a pacemaker for bradycardia at age 10. He needs to undergo a sleep study.  This will have to be arranged through his primary care clinic.    Current medicines are reviewed  at length with the patient today.  The patient does not have concerns regarding medicines.  The following changes have been made:  no change  Labs/ tests ordered today include:  No orders of the defined types were placed in this encounter.     Patient Instructions  Remote monitoring is used to monitor your Pacemaker of ICD from home. This monitoring reduces the number of office visits required to check your device to one time per year. It allows [place] to keep an eye on the functioning of your device to ensure it is working properly. You are scheduled for a device check from home on 02/02/16. You may send your transmission at any time that day. If you have a wireless device, the transmission will be sent automatically. After your physician reviews your transmission, you will receive a postcard with your next transmission date.  Your physician wants you to follow-up in: [place] will receive a reminder letter in the mail two months in advance. If you don't receive a letter, please call our office to schedule the follow-up appointment.        Mikael Spray, MD  10/26/2015 6:37 PM    Sanda Klein, MD, [facility] [phone] office [phone] pager

## 2015-10-24 NOTE — Patient Instructions (Signed)
Remote monitoring is used to monitor your Pacemaker of ICD from home. This monitoring reduces the number of office visits required to check your device to one time per year. It allows [place] to keep an eye on the functioning of your device to ensure it is working properly. You are scheduled for a device check from home on 02/02/16. You may send your transmission at any time that day. If you have a wireless device, the transmission will be sent automatically. After your physician reviews your transmission, you will receive a postcard with your next transmission date.   Your physician wants you to follow-up in: [place] will receive a reminder letter in the mail two months in advance. If you don't receive a letter, please call our office to schedule the follow-up appointment.

## 2015-10-30 LAB — CUP PACEART INCLINIC DEVICE CHECK
Battery Remaining Longevity: 108 mo
Battery Voltage: 3.02 V
Brady Statistic RA Percent Paced: 46 %
Brady Statistic RV Percent Paced: 1 %
Implantable Lead Implant Date: 20151228
Implantable Lead Implant Date: 20151228
Implantable Lead Location: 753859
Lead Channel Pacing Threshold Amplitude: 0.5 V
Lead Channel Pacing Threshold Pulse Width: 0.4 ms
Lead Channel Sensing Intrinsic Amplitude: 3.3 mV
Lead Channel Setting Pacing Amplitude: 2.5 V
Lead Channel Setting Pacing Pulse Width: 0.4 ms
MDC IDC LEAD LOCATION: 753860
MDC IDC MSMT LEADCHNL RA IMPEDANCE VALUE: 540 Ohm
MDC IDC MSMT LEADCHNL RV IMPEDANCE VALUE: 480 Ohm
MDC IDC MSMT LEADCHNL RV PACING THRESHOLD AMPLITUDE: 0.75 V
MDC IDC MSMT LEADCHNL RV PACING THRESHOLD PULSEWIDTH: 0.4 ms
MDC IDC MSMT LEADCHNL RV SENSING INTR AMPL: 3.2 mV
MDC IDC PG SERIAL: 7699241
MDC IDC SESS DTM: 20161115125210
MDC IDC SET LEADCHNL RA PACING AMPLITUDE: 2 V

## 2015-11-01 ENCOUNTER — Encounter (HOSPITAL_BASED_OUTPATIENT_CLINIC_OR_DEPARTMENT_OTHER): Payer: Self-pay | Admitting: Clinical

## 2015-11-01 ENCOUNTER — Other Ambulatory Visit: Payer: Self-pay | Admitting: Family Medicine

## 2015-11-01 ENCOUNTER — Ambulatory Visit: Payer: Medicaid Other | Attending: Family Medicine | Admitting: Family Medicine

## 2015-11-01 ENCOUNTER — Encounter: Payer: Self-pay | Admitting: Family Medicine

## 2015-11-01 VITALS — BP 112/74 | HR 60 | Temp 98.6°F | Resp 16 | Ht 74.0 in | Wt 181.0 lb

## 2015-11-01 DIAGNOSIS — F418 Other specified anxiety disorders: Secondary | ICD-10-CM

## 2015-11-01 DIAGNOSIS — Z0000 Encounter for general adult medical examination without abnormal findings: Secondary | ICD-10-CM

## 2015-11-01 DIAGNOSIS — K0889 Other specified disorders of teeth and supporting structures: Secondary | ICD-10-CM | POA: Diagnosis not present

## 2015-11-01 DIAGNOSIS — H538 Other visual disturbances: Secondary | ICD-10-CM | POA: Diagnosis not present

## 2015-11-01 DIAGNOSIS — J449 Chronic obstructive pulmonary disease, unspecified: Secondary | ICD-10-CM | POA: Diagnosis not present

## 2015-11-01 DIAGNOSIS — Z1159 Encounter for screening for other viral diseases: Secondary | ICD-10-CM

## 2015-11-01 DIAGNOSIS — F419 Anxiety disorder, unspecified: Principal | ICD-10-CM

## 2015-11-01 DIAGNOSIS — I10 Essential (primary) hypertension: Secondary | ICD-10-CM | POA: Diagnosis not present

## 2015-11-01 DIAGNOSIS — F17210 Nicotine dependence, cigarettes, uncomplicated: Secondary | ICD-10-CM | POA: Diagnosis not present

## 2015-11-01 DIAGNOSIS — Z79899 Other long term (current) drug therapy: Secondary | ICD-10-CM | POA: Diagnosis not present

## 2015-11-01 DIAGNOSIS — Z114 Encounter for screening for human immunodeficiency virus [HIV]: Secondary | ICD-10-CM

## 2015-11-01 DIAGNOSIS — F32A Depression, unspecified: Secondary | ICD-10-CM

## 2015-11-01 DIAGNOSIS — R42 Dizziness and giddiness: Secondary | ICD-10-CM

## 2015-11-01 DIAGNOSIS — F329 Major depressive disorder, single episode, unspecified: Secondary | ICD-10-CM

## 2015-11-01 DIAGNOSIS — R202 Paresthesia of skin: Secondary | ICD-10-CM | POA: Insufficient documentation

## 2015-11-01 DIAGNOSIS — F17200 Nicotine dependence, unspecified, uncomplicated: Secondary | ICD-10-CM

## 2015-11-01 DIAGNOSIS — H269 Unspecified cataract: Secondary | ICD-10-CM | POA: Diagnosis not present

## 2015-11-01 DIAGNOSIS — E559 Vitamin D deficiency, unspecified: Secondary | ICD-10-CM

## 2015-11-01 DIAGNOSIS — Z7982 Long term (current) use of aspirin: Secondary | ICD-10-CM | POA: Insufficient documentation

## 2015-11-01 LAB — POCT GLYCOSYLATED HEMOGLOBIN (HGB A1C): Hemoglobin A1C: 6.3

## 2015-11-01 LAB — HEPATITIS C ANTIBODY: HCV Ab: NEGATIVE

## 2015-11-01 LAB — TSH: TSH: 1.352 u[IU]/mL (ref 0.350–4.500)

## 2015-11-01 LAB — VITAMIN B12: VITAMIN B 12: 424 pg/mL (ref 211–911)

## 2015-11-01 LAB — GLUCOSE, POCT (MANUAL RESULT ENTRY): POC GLUCOSE: 91 mg/dL (ref 70–99)

## 2015-11-01 MED ORDER — SERTRALINE HCL 25 MG PO TABS: 25.0000 mg | ORAL_TABLET | Freq: Every day | ORAL | Status: DC

## 2015-11-01 MED ORDER — TIOTROPIUM BROMIDE MONOHYDRATE 18 MCG IN CAPS: 18.0000 ug | ORAL_CAPSULE | Freq: Every day | RESPIRATORY_TRACT | Status: DC

## 2015-11-01 NOTE — Progress Notes (Signed)
C/C SHOB, low energy, problems with vision No tobacco user  No suicide thought in the past two week

## 2015-11-01 NOTE — Progress Notes (Signed)
ASSESSMENT: Pt currently experiencing symptoms of anxiety and depression. Pt needs to f/u with PCP and [facility], and would benefit from supportive counseling regarding coping with symptoms of anxiety and depression.  Stage of Change: contemplative  PLAN: 1. F/U with behavioral health consultant in as needed 2. Psychiatric Medications: Zoloft, starting today 3. Behavioral recommendation(s):   -Consider adding relaxation breathing exercises, as practiced in office visit -Continue putting together daily plan with checklist -Continue with meditation/yoga/prayer -Continue to keep positive outlook   SUBJECTIVE: Pt. referred by Dr Adrian Blackwater for symptoms of anxiety and depression:  Pt. reports the following symptoms/concerns: Pt states that he is used to working 12-18 hours/day and that he has a lot of time to himself now. He is starting to put together a daily checklist, so that he feels he has accomplished something each day, and does his "own kind of meditation, yoga, and prayer" when he is alone, but gets irritated and stressed when he is around a lot of people.  Duration of problem: about 2 months Severity: moderate  OBJECTIVE: Orientation & Cognition: Oriented x3. Thought processes normal and appropriate to situation. Mood: appropriate. Affect: appropriate Appearance: appropriate Risk of harm to self or others: no known risk of harm to self or others Substance use: tobacco Assessments administered: PHQ9: 15/ GAD7: 18  Diagnosis: Anxiety and depression CPT Code: F41.8 -------------------------------------------- Other(s) present in the room: none  Time spent with patient in exam room: 20 minutes

## 2015-11-01 NOTE — Patient Instructions (Addendum)
Diagnoses and all orders for this visit:  Cataract -     Ambulatory referral to Ophthalmology  Pain of molar -     Ambulatory referral to Dentistry  Anxiety and depression -     Vitamin D, 25-hydroxy -     sertraline (ZOLOFT) 25 MG tablet; Take 1 tablet (25 mg total) by mouth daily.  Tingling in extremities -     TSH -     HgB A1c -     Glucose (CBG) -     Vitamin D, 25-hydroxy -     Vitamin B12  Dizziness  Tobacco use disorder  COPD mixed type (HCC) -     tiotropium (SPIRIVA HANDIHALER) 18 MCG inhalation capsule; Place 1 capsule (18 mcg total) into inhaler and inhale daily.   You do not have anemia, last Hbg 15 done last month  Smoking cessation support: smoking cessation hotline: 1-800-QUIT-NOW.  Smoking cessation classes are available through [facility]. Call [phone] or visit our website at [email or web address]  You have depression and anxiety as well as significant COPD  Start zoloft 25 mg daily this is low dose antidepresant Counseling services available at [facility] of [place], [place] and [place].    F/u in 4 weeks for COPD and depression   Dr. Adrian Blackwater

## 2015-11-01 NOTE — Progress Notes (Signed)
Patient ID: Wesley [name], male   DOB: 06-10-1956, 59 y.o.   MRN: [id number]  Subjective:  Patient ID: Wesley [name], male    DOB: 06-04-1956  Age: 59 y.o. MRN: [id number]  CC: Establish Care; Depression; and Blurred Vision   HPI Wesley [name] presents to establish care, he has multiple complaints, the following was addressed today   1. Blurry vision: for > one year. With sensitivity to light. Has been told he has cataracts in the past. Lack of insurance prevented f/u treatment.   2. Depression: admits to depressed and irritable mood. Admits to suicidal thoughts. Feels tired, cold, SOB at times, run down, low energy. Has hx of depression. Was in foster care as a child. Has had a hard life.  3. COPD: smoked for 40 years. Smoked up to 1.5 PPD. Has COPD. Using albuterol prn. SOB with exertion. Has cut down to 3 cigs per day.   4. Dental pain: has many broken teeth. Mostly molars. No swelling. Often has pain. No fever.   5. Tingling in extremities: hand, feet, legs. Associated with numbness and joint pains.   Past Medical History  Diagnosis Date  . Hypertension     Past Surgical History  Procedure Laterality Date  . Pacemaker insertion      Family History  Problem Relation Age of Onset  . Cancer Mother   . Cancer Father     Social History  Substance Use Topics  . Smoking status: Current Some Day Smoker -- 0.25 packs/day    Types: Cigarettes  . Smokeless tobacco: Not on file  . Alcohol Use: No    ROS Review of Systems  Constitutional: Positive for unexpected weight change. Negative for fever, chills and fatigue.  HENT: Positive for dental problem.   Eyes: Positive for visual disturbance.  Respiratory: Positive for shortness of breath. Negative for cough.   Cardiovascular: Negative for chest pain, palpitations and leg swelling.  Gastrointestinal: Negative for nausea, vomiting, abdominal pain, diarrhea, constipation and blood in stool.  Endocrine:  Positive for cold intolerance. Negative for polydipsia, polyphagia and polyuria.  Musculoskeletal: Positive for arthralgias. Negative for myalgias, back pain, gait problem and neck pain.  Skin: Negative for rash.  Allergic/Immunologic: Negative for immunocompromised state.  Neurological: Positive for dizziness and numbness.  Hematological: Negative for adenopathy. Does not bruise/bleed easily.  Psychiatric/Behavioral: Positive for suicidal ideas, sleep disturbance and dysphoric mood. Negative for hallucinations, behavioral problems, confusion, self-injury, decreased concentration and agitation. The patient is nervous/anxious. The patient is not hyperactive.     Objective:   Today's Vitals: BP 112/74 mmHg  Pulse 60  Temp(Src) 98.6 F (37 C) (Oral)  Resp 16  Ht 6\' 2"  (1.88 m)  Wt 181 lb (82.101 kg)  BMI 23.23 kg/m2  SpO2 98%  Physical Exam  Constitutional: He appears well-developed and well-nourished. No distress.  HENT:  Head: Normocephalic and atraumatic.  Neck: Normal range of motion. Neck supple.  Cardiovascular: Normal rate, regular rhythm, normal heart sounds and intact distal pulses.   Pulmonary/Chest: Effort normal and breath sounds normal.  Musculoskeletal: He exhibits no edema.  Clubbing of fingers   Neurological: He is alert.  Skin: Skin is warm and dry. No rash noted. No erythema.  Psychiatric: He has a normal mood and affect.    Depression screen [facility] 2/9 11/01/2015 10/10/2015 10/04/2015 10/04/2015  Decreased Interest 2 0 2 2  Down, Depressed, Hopeless 2 0 2 2  PHQ - 2 Score 4 0 4 4  Altered  sleeping 3 - 3 -  Tired, decreased energy 2 - 3 -  Change in appetite 3 - 3 -  Feeling bad or failure about yourself  2 - 2 -  Trouble concentrating 1 - 2 -  Moving slowly or fidgety/restless 2 - 2 -  Suicidal thoughts 1 - 0 -  PHQ-9 Score 18 - 19 -    GAD 7 : Generalized Anxiety Score 11/01/2015  Nervous, Anxious, on Edge 2  Control/stop worrying 3  Worry too much -  different things 3  Trouble relaxing 2  Restless 2  Easily annoyed or irritable 3  Afraid - awful might happen 3  Total GAD 7 Score 18       Chemistry      Component Value Date/Time   NA 139 10/01/2015 1943   K 3.9 10/01/2015 1943   CL 105 10/01/2015 1943   CO2 26 10/01/2015 1943   BUN 14 10/01/2015 1943   CREATININE 0.74 10/01/2015 1943      Component Value Date/Time   CALCIUM 9.4 10/01/2015 1943     CBC    Component Value Date/Time   WBC 5.9 10/01/2015 1943   RBC 5.36 10/01/2015 1943   HGB 15.5 10/01/2015 1943   HCT 47.7 10/01/2015 1943   PLT 189 10/01/2015 1943   MCV 89.0 10/01/2015 1943   [place] 28.9 10/01/2015 1943   MCHC 32.5 10/01/2015 1943   RDW 14.2 10/01/2015 1943    Assessment & Plan:   Internal referral to CSW/Case Manager made   Problem List Items Addressed This Visit    Anxiety and depression (Chronic)   Relevant Medications   sertraline (ZOLOFT) 25 MG tablet   Other Relevant Orders   Vitamin D, 25-hydroxy   Cataract - Primary   Relevant Orders   Ambulatory referral to Ophthalmology   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 albuterol (PROVENTIL HFA;VENTOLIN HFA) 108 (90 BASE) MCG/ACT inhaler Inhale 2 puffs into the lungs every 6 (six) hours as needed for wheezing or shortness of breath.  Marland Kitchen amLODipine (NORVASC) 10 MG tablet Take 1 tablet (10 mg total) by mouth daily.  Marland Kitchen aspirin 81 MG tablet Take 1 tablet (81 mg total) by mouth daily.  Marland Kitchen lisinopril (PRINIVIL,ZESTRIL) 20 MG tablet Take 1 tablet (20 mg total) by mouth 2 (two) times  daily.  . tamsulosin (FLOMAX) 0.4 MG CAPS capsule Take 1 capsule (0.4 mg total) by mouth daily.  Marland Kitchen tiotropium (SPIRIVA HANDIHALER) 18 MCG inhalation capsule Place 1 capsule (18 mcg total) into inhaler and inhale daily.   No facility-administered encounter medications on file as of 11/01/2015.    Follow-up: No Follow-up on file.    Boykin Nearing MD

## 2015-11-02 LAB — VITAMIN D 25 HYDROXY (VIT D DEFICIENCY, FRACTURES): Vit D, 25-Hydroxy: 20 ng/mL — ABNORMAL LOW (ref 30–100)

## 2015-11-02 LAB — HIV ANTIBODY (ROUTINE TESTING W REFLEX): HIV: NONREACTIVE

## 2015-11-05 DIAGNOSIS — E559 Vitamin D deficiency, unspecified: Secondary | ICD-10-CM | POA: Insufficient documentation

## 2015-11-05 MED ORDER — VITAMIN D3 50 MCG (2000 UT) PO TABS
2000.0000 [IU] | ORAL_TABLET | Freq: Every day | ORAL | Status: DC
Start: 2015-11-05 — End: 2019-02-02

## 2015-11-05 NOTE — Addendum Note (Signed)
Addended by: Boykin Nearing on: 11/05/2015 09:35 AM   Modules accepted: Orders

## 2015-11-07 ENCOUNTER — Telehealth: Payer: Self-pay | Admitting: *Deleted

## 2015-11-07 NOTE — Telephone Encounter (Signed)
-----   Message from Boykin Nearing, MD sent at 11/05/2015  9:34 AM EST ----- Vit D insufficiency, vit D 2000 IU D3 daily ordered All other labs normal

## 2015-11-07 NOTE — Telephone Encounter (Signed)
-----   Message from Boykin Nearing, MD sent at 11/02/2015  9:37 AM EST ----- Screening hep C negative

## 2015-11-07 NOTE — Telephone Encounter (Signed)
LVM to return call.

## 2015-11-12 ENCOUNTER — Encounter: Payer: Self-pay | Admitting: Internal Medicine

## 2015-11-29 ENCOUNTER — Encounter: Payer: Self-pay | Admitting: Family Medicine

## 2015-11-29 ENCOUNTER — Ambulatory Visit: Payer: Medicaid Other | Attending: Family Medicine | Admitting: Family Medicine

## 2015-11-29 VITALS — BP 110/67 | HR 60 | Temp 97.7°F | Resp 16 | Ht 74.0 in | Wt 183.0 lb

## 2015-11-29 DIAGNOSIS — Z7982 Long term (current) use of aspirin: Secondary | ICD-10-CM | POA: Insufficient documentation

## 2015-11-29 DIAGNOSIS — R197 Diarrhea, unspecified: Secondary | ICD-10-CM | POA: Insufficient documentation

## 2015-11-29 DIAGNOSIS — A084 Viral intestinal infection, unspecified: Secondary | ICD-10-CM | POA: Diagnosis not present

## 2015-11-29 DIAGNOSIS — Z87898 Personal history of other specified conditions: Secondary | ICD-10-CM

## 2015-11-29 DIAGNOSIS — N400 Enlarged prostate without lower urinary tract symptoms: Secondary | ICD-10-CM | POA: Diagnosis not present

## 2015-11-29 DIAGNOSIS — M2550 Pain in unspecified joint: Secondary | ICD-10-CM

## 2015-11-29 DIAGNOSIS — J449 Chronic obstructive pulmonary disease, unspecified: Secondary | ICD-10-CM | POA: Diagnosis not present

## 2015-11-29 DIAGNOSIS — F17210 Nicotine dependence, cigarettes, uncomplicated: Secondary | ICD-10-CM | POA: Insufficient documentation

## 2015-11-29 DIAGNOSIS — Z79899 Other long term (current) drug therapy: Secondary | ICD-10-CM | POA: Insufficient documentation

## 2015-11-29 DIAGNOSIS — F1911 Other psychoactive substance abuse, in remission: Secondary | ICD-10-CM

## 2015-11-29 DIAGNOSIS — F329 Major depressive disorder, single episode, unspecified: Secondary | ICD-10-CM

## 2015-11-29 DIAGNOSIS — F418 Other specified anxiety disorders: Secondary | ICD-10-CM

## 2015-11-29 DIAGNOSIS — F32A Depression, unspecified: Secondary | ICD-10-CM

## 2015-11-29 DIAGNOSIS — I10 Essential (primary) hypertension: Secondary | ICD-10-CM

## 2015-11-29 DIAGNOSIS — R413 Other amnesia: Secondary | ICD-10-CM | POA: Diagnosis not present

## 2015-11-29 DIAGNOSIS — F419 Anxiety disorder, unspecified: Secondary | ICD-10-CM

## 2015-11-29 DIAGNOSIS — E785 Hyperlipidemia, unspecified: Secondary | ICD-10-CM | POA: Diagnosis not present

## 2015-11-29 LAB — LIPID PANEL
CHOLESTEROL: 108 mg/dL — AB (ref 125–200)
HDL: 39 mg/dL — AB (ref >=40)
LDL Cholesterol: 56 mg/dL (ref <130)
Total CHOL/HDL Ratio: 2.8 Ratio (ref <=5.0)
Triglycerides: 64 mg/dL (ref <150)
VLDL: 13 mg/dL (ref <30)

## 2015-11-29 LAB — VITAMIN B12: VITAMIN B 12: 402 pg/mL (ref 211–911)

## 2015-11-29 LAB — TSH: TSH: 0.835 u[IU]/mL (ref 0.350–4.500)

## 2015-11-29 MED ORDER — ACETAMINOPHEN 500 MG PO TABS: 500.0000 mg | ORAL_TABLET | Freq: Three times a day (TID) | ORAL | Status: DC | PRN

## 2015-11-29 MED ORDER — SERTRALINE HCL 25 MG PO TABS: 25.0000 mg | ORAL_TABLET | Freq: Every day | ORAL | Status: DC

## 2015-11-29 MED ORDER — TAMSULOSIN HCL 0.4 MG PO CAPS: 0.4000 mg | ORAL_CAPSULE | Freq: Every day | ORAL | Status: DC

## 2015-11-29 MED ORDER — ONDANSETRON HCL 8 MG PO TABS: 8.0000 mg | ORAL_TABLET | Freq: Three times a day (TID) | ORAL | Status: DC | PRN

## 2015-11-29 NOTE — Patient Instructions (Addendum)
Wesley [name] was seen today for follow-up and medication refill.  Diagnoses and all orders for this visit:  BPH (benign prostatic hyperplasia) -     tamsulosin (FLOMAX) 0.4 MG CAPS capsule; Take 1 capsule (0.4 mg total) by mouth daily.  History of substance abuse  Viral gastroenteritis -     ondansetron (ZOFRAN) 8 MG tablet; Take 1 tablet (8 mg total) by mouth every 8 (eight) hours as needed for nausea or vomiting.  Essential hypertension -     Lipid Panel  Arthralgia -     Vitamin D, 25-hydroxy  Memory loss -     Vitamin B12 -     TSH -     RPR    Please f/u for mini mental status evaluation and to review labs. We will also discuss of imaging of the brain is needed.  F/u in 4 weeks   Dr. Adrian Blackwater   Viral Gastroenteritis Viral gastroenteritis is also known as stomach flu. This condition affects the stomach and intestinal tract. It can cause sudden diarrhea and vomiting. The illness typically lasts 3 to 8 days. Most people develop an immune response that eventually gets rid of the virus. While this natural response develops, the virus can make you quite ill. CAUSES  Many different viruses can cause gastroenteritis, such as rotavirus or noroviruses. You can catch one of these viruses by consuming contaminated food or water. You may also catch a virus by sharing utensils or other personal items with an infected person or by touching a contaminated surface. SYMPTOMS  The most common symptoms are diarrhea and vomiting. These problems can cause a severe loss of body fluids (dehydration) and a body salt (electrolyte) imbalance. Other symptoms may include:  Fever.  Headache.  Fatigue.  Abdominal pain. DIAGNOSIS  Your caregiver can usually diagnose viral gastroenteritis based on your symptoms and a physical exam. A stool sample may also be taken to test for the presence of viruses or other infections. TREATMENT  This illness typically goes away on its own. Treatments are aimed at  rehydration. The most serious cases of viral gastroenteritis involve vomiting so severely that you are not able to keep fluids down. In these cases, fluids must be given through an intravenous line (IV). HOME CARE INSTRUCTIONS   Drink enough fluids to keep your urine clear or pale yellow. Drink small amounts of fluids frequently and increase the amounts as tolerated.  Ask your caregiver for specific rehydration instructions.  Avoid:  Foods high in sugar.  Alcohol.  Carbonated drinks.  Tobacco.  Juice.  Caffeine drinks.  Extremely hot or cold fluids.  Fatty, greasy foods.  Too much intake of anything at one time.  Dairy products until 24 to 48 hours after diarrhea stops.  You may consume probiotics. Probiotics are active cultures of beneficial bacteria. They may lessen the amount and number of diarrheal stools in adults. Probiotics can be found in yogurt with active cultures and in supplements.  Wash your hands well to avoid spreading the virus.  Only take over-the-counter or prescription medicines for pain, discomfort, or fever as directed by your caregiver. Do not give aspirin to children. Antidiarrheal medicines are not recommended.  Ask your caregiver if you should continue to take your regular prescribed and over-the-counter medicines.  Keep all follow-up appointments as directed by your caregiver. SEEK IMMEDIATE MEDICAL CARE IF:   You are unable to keep fluids down.  You do not urinate at least once every 6 to 8 hours.  You develop  shortness of breath.  You notice blood in your stool or vomit. This may look like coffee grounds.  You have abdominal pain that increases or is concentrated in one small area (localized).  You have persistent vomiting or diarrhea.  You have a fever.  The patient is a child younger than 3 months, and he or she has a fever.  The patient is a child older than 3 months, and he or she has a fever and persistent symptoms.  The  patient is a child older than 3 months, and he or she has a fever and symptoms suddenly get worse.  The patient is a baby, and he or she has no tears when crying. MAKE SURE YOU:   Understand these instructions.  Will watch your condition.  Will get help right away if you are not doing well or get worse.   This information is not intended to replace advice given to you by your health care provider. Make sure you discuss any questions you have with your health care provider.   Document Released: 12/01/2005 Document Revised: 02/23/2012 Document Reviewed: 09/17/2011 Elsevier Interactive Patient Education [employer].

## 2015-11-29 NOTE — Progress Notes (Signed)
Patient ID: Wesley [name], male   DOB: [birth year], 59 y.o.   MRN: [id number]   Subjective:  Patient ID: Wesley [name], male    DOB: Jul 11, 1956  Age: 59 y.o. MRN: [id number]  CC: Follow-up and Medication Refill   HPI SHABAN APARICIO presents for    1. Nausea, emesis and diarrhea: x 4 days. Diarrhea is non bloody. No abdominal pain. No fever or chills. No known sick contacts.   2. COPD: SOB at times. Albuterol helps. Compliant with spiriva.  3. Depressed mood: zoloft helped with irritability. He is not taking it at the moment.  Has hx of polysubstance abuse with ETOH, cocaine, etc for 15 years. Sober now for 4 months. Reports depressed mood, sometimes anxious. Also having trouble with memory. He has known HTN that is well controlled. A1c was high normal at last check. Screening HIV negative.   4. Arthralgias: joint aches and pain. Worse in cold weather. No joint swelling. No OTC meds tried.   Social History  Substance Use Topics  . Smoking status: Current Some Day Smoker -- 0.25 packs/day for 40 years    Types: Cigarettes  . Smokeless tobacco: Never Used  . Alcohol Use: No    Outpatient Prescriptions Prior to Visit  Medication Sig Dispense Refill  . albuterol (PROVENTIL HFA;VENTOLIN HFA) 108 (90 BASE) MCG/ACT inhaler Inhale 2 puffs into the lungs every 6 (six) hours as needed for wheezing or shortness of breath.    Marland Kitchen amLODipine (NORVASC) 10 MG tablet Take 1 tablet (10 mg total) by mouth daily. 90 tablet 11  . aspirin 81 MG tablet Take 1 tablet (81 mg total) by mouth daily. 30 tablet 2  . Cholecalciferol (VITAMIN D3) 2000 UNITS TABS Take 2,000 Units by mouth daily. 30 tablet 11  . lisinopril (PRINIVIL,ZESTRIL) 20 MG tablet Take 1 tablet (20 mg total) by mouth 2 (two) times daily. 60 tablet 2  . tamsulosin (FLOMAX) 0.4 MG CAPS capsule Take 1 capsule (0.4 mg total) by mouth daily. 30 capsule 3  . tiotropium (SPIRIVA HANDIHALER) 18 MCG inhalation capsule Place 1 capsule (18 mcg  total) into inhaler and inhale daily. 30 capsule 12  . sertraline (ZOLOFT) 25 MG tablet Take 1 tablet (25 mg total) by mouth daily. (Patient not taking: Reported on 11/29/2015) 30 tablet 2   No facility-administered medications prior to visit.    ROS Review of Systems  Constitutional: Negative for fever, chills, fatigue and unexpected weight change.  Eyes: Negative for visual disturbance.  Respiratory: Negative for cough and shortness of breath.   Cardiovascular: Negative for chest pain, palpitations and leg swelling.  Gastrointestinal: Positive for nausea, vomiting and diarrhea. Negative for abdominal pain, constipation, blood in stool, anal bleeding and rectal pain.  Endocrine: Negative for polydipsia, polyphagia and polyuria.  Musculoskeletal: Positive for arthralgias. Negative for myalgias, back pain, gait problem and neck pain.  Skin: Negative for rash.  Allergic/Immunologic: Negative for immunocompromised state.  Hematological: Negative for adenopathy. Does not bruise/bleed easily.  Psychiatric/Behavioral: Positive for dysphoric mood. Negative for suicidal ideas and sleep disturbance. The patient is not nervous/anxious.     Objective:  BP 110/67 mmHg  Pulse 60  Temp(Src) 97.7 F (36.5 C) (Oral)  Resp 16  Ht 6\' 2"  (1.88 m)  Wt 183 lb (83.008 kg)  BMI 23.49 kg/m2  SpO2 98%  BP/Weight 11/29/2015 11/01/2015 [id number]  Systolic BP [id number]  Diastolic BP 67 74 60  Wt. (Lbs) 183 181 182.7  BMI 23.49 23.23 23.45  Physical Exam  Constitutional: He appears well-developed and well-nourished. No distress.  HENT:  Head: Normocephalic and atraumatic.  Neck: Normal range of motion. Neck supple.  Cardiovascular: Normal rate, regular rhythm, normal heart sounds and intact distal pulses.   Pulmonary/Chest: Effort normal and breath sounds normal.  Musculoskeletal: He exhibits no edema.  Neurological: He is alert.  Skin: Skin is warm and dry. No rash noted. No erythema.    Psychiatric: He has a normal mood and affect.     Assessment & Plan:   Problem List Items Addressed This Visit    Anxiety and depression (Chronic)   Relevant Medications   sertraline (ZOLOFT) 25 MG tablet   Arthralgia   Relevant Medications   acetaminophen (TYLENOL) 500 MG tablet   Other Relevant Orders   Vitamin D, 25-hydroxy   BPH (benign prostatic hyperplasia) - Primary (Chronic)   Relevant Medications   tamsulosin (FLOMAX) 0.4 MG CAPS capsule   History of substance abuse (Chronic)    15 years of  ETOH, crack and other street drugs      Hypertension (Chronic)   Relevant Orders   Lipid Panel   Memory loss    A; subjective memory deficit in setting of depression. Hx of polysubstance abuse has also likely attributed to cognitive decline. P: Look for reversible cause with labs F/u for MMSE, consider neuroimaging Treat depression       Relevant Orders   Vitamin B12   TSH   RPR   Viral gastroenteritis   Relevant Medications   ondansetron (ZOFRAN) 8 MG tablet      No orders of the defined types were placed in this encounter.    Follow-up: No Follow-up on file.   Boykin Nearing MD

## 2015-11-29 NOTE — Assessment & Plan Note (Signed)
15 years of  ETOH, crack and other street drugs

## 2015-11-29 NOTE — Progress Notes (Signed)
C/C NVD x 4 days, medicine refills "Unable to hold anything down" No pain today  Tobacco user 1-2 cigarette per day  No suicidal thought.

## 2015-11-29 NOTE — Assessment & Plan Note (Signed)
A; subjective memory deficit in setting of depression. Hx of polysubstance abuse has also likely attributed to cognitive decline. P: Look for reversible cause with labs F/u for MMSE, consider neuroimaging Treat depression

## 2015-11-30 DIAGNOSIS — E785 Hyperlipidemia, unspecified: Secondary | ICD-10-CM | POA: Insufficient documentation

## 2015-11-30 LAB — RPR

## 2015-11-30 LAB — VITAMIN D 25 HYDROXY (VIT D DEFICIENCY, FRACTURES): Vit D, 25-Hydroxy: 46 ng/mL (ref 30–100)

## 2015-11-30 MED ORDER — ATORVASTATIN CALCIUM 20 MG PO TABS: 20.0000 mg | ORAL_TABLET | Freq: Every day | ORAL | Status: DC

## 2015-11-30 NOTE — Assessment & Plan Note (Signed)
Cholesterol elevated in setting of HTN and smoking lipitor 20 mg daily recommended and ordered

## 2015-11-30 NOTE — Addendum Note (Signed)
Addended by: Boykin Nearing on: 11/30/2015 06:09 PM   Modules accepted: Orders

## 2015-12-06 ENCOUNTER — Ambulatory Visit (AMBULATORY_SURGERY_CENTER): Payer: Self-pay | Admitting: *Deleted

## 2015-12-06 VITALS — Ht 74.0 in | Wt 184.0 lb

## 2015-12-06 DIAGNOSIS — Z1211 Encounter for screening for malignant neoplasm of colon: Secondary | ICD-10-CM

## 2015-12-06 MED ORDER — NA SULFATE-K SULFATE-MG SULF 17.5-3.13-1.6 GM/177ML PO SOLN: 1.0000 | Freq: Once | ORAL | Status: DC

## 2015-12-06 NOTE — Progress Notes (Signed)
No egg or soy allergy. No anesthesia problems.  No home O2.  No diet meds.  

## 2015-12-18 MED FILL — ?TAMSULOSIN HCL 0.4 MG CAP: 0.4 | 30 days supply | Qty: 30 | Fill #0

## 2015-12-18 MED FILL — ?ATORVASTATIN 20 MG TABLET: 20 | 30 days supply | Qty: 30 | Fill #0

## 2015-12-18 MED FILL — ONDANSETRON HCL 8 MG TABLET: 8 | 6 days supply | Qty: 20 | Fill #0

## 2015-12-18 MED FILL — SERTRALINE HCL 25 MG TABLET: 25 | 30 days supply | Qty: 30 | Fill #0

## 2015-12-20 ENCOUNTER — Ambulatory Visit (AMBULATORY_SURGERY_CENTER): Payer: Medicaid Other | Admitting: Internal Medicine

## 2015-12-20 ENCOUNTER — Telehealth: Payer: Self-pay | Admitting: *Deleted

## 2015-12-20 ENCOUNTER — Encounter: Payer: Self-pay | Admitting: Internal Medicine

## 2015-12-20 VITALS — BP 141/85 | HR 60 | Temp 98.3°F | Resp 16 | Ht 74.0 in | Wt 184.0 lb

## 2015-12-20 DIAGNOSIS — D123 Benign neoplasm of transverse colon: Secondary | ICD-10-CM

## 2015-12-20 DIAGNOSIS — K635 Polyp of colon: Secondary | ICD-10-CM

## 2015-12-20 DIAGNOSIS — Z1211 Encounter for screening for malignant neoplasm of colon: Secondary | ICD-10-CM

## 2015-12-20 DIAGNOSIS — D124 Benign neoplasm of descending colon: Secondary | ICD-10-CM

## 2015-12-20 MED ORDER — SODIUM CHLORIDE 0.9 % IV SOLN: 500.0000 mL | INTRAVENOUS | Status: DC

## 2015-12-20 NOTE — Op Note (Signed)
[place]  [address] [place], [id number]   COLONOSCOPY PROCEDURE REPORT  PATIENT: Wesley [name], Wesley [name]  MR#: [id number] BIRTHDATE: 07-30-1956 , 47  yrs. old GENDER: male ENDOSCOPIST: Jerene Bears, MD REFERRED [id number] Funches, MD PROCEDURE DATE:  12/20/2015 PROCEDURE:   Colonoscopy, screening and Colonoscopy with snare polypectomy First Screening Colonoscopy - Avg.  risk and is 50 yrs.  old or older Yes.  Prior Negative Screening - Now for repeat screening. N/A  History of Adenoma - Now for follow-up colonoscopy & has been > or = to 3 yrs.  N/A History of Adenoma - Now for follow-up colonoscopy & has been > or = to 3 yrs.  Polyps removed today? Yes ASA CLASS:   Class III INDICATIONS:Screening for colonic neoplasia, Colorectal Neoplasm Risk Assessment for this procedure is average risk, and first colonoscopy. MEDICATIONS: Monitored anesthesia care and Propofol 380 mg IV  DESCRIPTION OF PROCEDURE:   After the risks benefits and alternatives of the procedure were thoroughly explained, informed consent was obtained.  The digital rectal exam revealed no rectal mass.   The LB [id number]  endoscope was introduced through the anus and advanced to the cecum, which was identified by both the appendix and ileocecal valve. No adverse events experienced. The quality of the prep was good.  (Suprep was used)  The instrument was then slowly withdrawn as the colon was fully examined. Estimated blood loss is zero unless otherwise noted in this procedure report.   COLON FINDINGS: Four sessile polyps ranging between 3-36mm in size were found in the transverse colon (1) and descending colon (3). Polypectomies were performed with a cold snare.  The resection was complete, the polyp tissue was completely retrieved and sent to histology.   There was moderate diverticulosis noted in the sigmoid colon with associated muscular hypertrophy.  Retroflexed views revealed  internal hemorrhoids. The time to cecum = 4.4 Withdrawal time = 9.6   The scope was withdrawn and the procedure completed. COMPLICATIONS: There were no immediate complications.  ENDOSCOPIC IMPRESSION: 1.   Four sessile polyps ranging between 3-20mm in size were found in the transverse colon and descending colon; polypectomies were performed with a cold snare 2.   There was moderate diverticulosis noted in the sigmoid colon  RECOMMENDATIONS: 1.  Await pathology results 2.  Timing of repeat colonoscopy will be determined by pathology findings. 3.  You will receive a letter within 1-2 weeks with the results of your biopsy as well as final recommendations.  Please call my office if you have not received a letter after 3 weeks.  eSigned:  Jerene Bears, MD 12/20/2015 3:17 PM cc: Boykin Nearing, MD and The Patient

## 2015-12-20 NOTE — Progress Notes (Signed)
Called to room to assist during endoscopic procedure.  Patient ID and intended procedure confirmed with present staff. Received instructions for my participation in the procedure from the performing physician.  

## 2015-12-20 NOTE — Patient Instructions (Signed)
YOU HAD AN ENDOSCOPIC PROCEDURE TODAY AT THE [facility]:   Refer to the procedure report that was given to you for any specific questions about what was found during the examination.  If the procedure report does not answer your questions, please call your gastroenterologist to clarify.  If you requested that your care partner not be given the details of your procedure findings, then the procedure report has been included in a sealed envelope for you to review at your convenience later.  YOU SHOULD EXPECT: Some feelings of bloating in the abdomen. Passage of more gas than usual.  Walking can help get rid of the air that was put into your GI tract during the procedure and reduce the bloating. If you had a lower endoscopy (such as a colonoscopy or flexible sigmoidoscopy) you may notice spotting of blood in your stool or on the toilet paper. If you underwent a bowel prep for your procedure, you may not have a normal bowel movement for a few days.  Please Note:  You might notice some irritation and congestion in your nose or some drainage.  This is from the oxygen used during your procedure.  There is no need for concern and it should clear up in a day or so.  SYMPTOMS TO REPORT IMMEDIATELY:   Following lower endoscopy (colonoscopy or flexible sigmoidoscopy):  Excessive amounts of blood in the stool  Significant tenderness or worsening of abdominal pains  Swelling of the abdomen that is new, acute  Fever of 100F or higher    For urgent or emergent issues, a gastroenterologist can be reached at any hour by calling [phone].   DIET: Your first meal following the procedure should be a small meal and then it is ok to progress to your normal diet. Heavy or fried foods are harder to digest and may make you feel nauseous or bloated.  Likewise, meals heavy in dairy and vegetables can increase bloating.  Drink plenty of fluids but you should avoid alcoholic beverages for 24  hours.  ACTIVITY:  You should plan to take it easy for the rest of today and you should NOT DRIVE or use heavy machinery until tomorrow (because of the sedation medicines used during the test).    FOLLOW UP: Our staff will call the number listed on your records the next business day following your procedure to check on you and address any questions or concerns that you may have regarding the information given to you following your procedure. If we do not reach you, we will leave a message.  However, if you are feeling well and you are not experiencing any problems, there is no need to return our call.  We will assume that you have returned to your regular daily activities without incident.  If any biopsies were taken you will be contacted by phone or by letter within the next 1-3 weeks.  Please call us at [phone] if you have not heard about the biopsies in 3 weeks.    SIGNATURES/CONFIDENTIALITY: You and/or your care partner have signed paperwork which will be entered into your electronic medical record.  These signatures attest to the fact that that the information above on your After Visit Summary has been reviewed and is understood.  Full responsibility of the confidentiality of this discharge information lies with you and/or your care-partner.   Resume medications. Information given on polyps,diverticulosis and high fiber diet. 

## 2015-12-20 NOTE — Telephone Encounter (Signed)
Left message with male  Pt having a procedure today

## 2015-12-20 NOTE — Telephone Encounter (Signed)
-----   Message from Boykin Nearing, MD sent at 11/30/2015  6:08 PM EST ----- RPR negative Vit D normal TSH normal  Cholesterol elevated in setting of HTN and smoking lipitor 20 mg daily recommended and ordered

## 2015-12-20 NOTE — Progress Notes (Signed)
Stable to RR 

## 2015-12-21 ENCOUNTER — Telehealth: Payer: Self-pay

## 2015-12-21 NOTE — Telephone Encounter (Signed)
  Follow up Call-  Call back number 12/20/2015  Post procedure Call Back phone  # [phone]  Permission to leave phone message Yes     Patient questions:  Do you have a fever, pain , or abdominal swelling? No. Pain Score  0 *  Have you tolerated food without any problems? Yes.    Have you been able to return to your normal activities? Yes.    Do you have any questions about your discharge instructions: Diet   No. Medications  No. Follow up visit  No.  Do you have questions or concerns about your Care? Yes.    Actions: * If pain score is 4 or above: No action needed, pain <4.

## 2015-12-26 ENCOUNTER — Encounter: Payer: Self-pay | Admitting: Internal Medicine

## 2015-12-27 ENCOUNTER — Encounter: Payer: Self-pay | Admitting: Family Medicine

## 2015-12-27 ENCOUNTER — Ambulatory Visit: Payer: Medicaid Other | Attending: Family Medicine | Admitting: Family Medicine

## 2015-12-27 VITALS — BP 123/76 | HR 59 | Temp 98.3°F | Resp 16 | Ht 74.0 in | Wt 191.0 lb

## 2015-12-27 DIAGNOSIS — R413 Other amnesia: Secondary | ICD-10-CM

## 2015-12-27 DIAGNOSIS — F17210 Nicotine dependence, cigarettes, uncomplicated: Secondary | ICD-10-CM | POA: Diagnosis not present

## 2015-12-27 DIAGNOSIS — F329 Major depressive disorder, single episode, unspecified: Secondary | ICD-10-CM | POA: Diagnosis not present

## 2015-12-27 DIAGNOSIS — Z6824 Body mass index (BMI) 24.0-24.9, adult: Secondary | ICD-10-CM | POA: Diagnosis not present

## 2015-12-27 DIAGNOSIS — F419 Anxiety disorder, unspecified: Secondary | ICD-10-CM | POA: Insufficient documentation

## 2015-12-27 DIAGNOSIS — F418 Other specified anxiety disorders: Secondary | ICD-10-CM

## 2015-12-27 DIAGNOSIS — Z7982 Long term (current) use of aspirin: Secondary | ICD-10-CM | POA: Diagnosis not present

## 2015-12-27 DIAGNOSIS — Z79899 Other long term (current) drug therapy: Secondary | ICD-10-CM | POA: Insufficient documentation

## 2015-12-27 MED ORDER — SERTRALINE HCL 50 MG PO TABS: 50.0000 mg | ORAL_TABLET | Freq: Every day | ORAL | Status: DC

## 2015-12-27 NOTE — Assessment & Plan Note (Signed)
Improving Increase zoloft from 25 to 50 mg daily

## 2015-12-27 NOTE — Progress Notes (Signed)
Patient ID: Wesley [name], male   DOB: July 23, 1956, 60 y.o.   MRN: [id number]   Subjective:  Patient ID: Wesley [name], male    DOB: May 25, 1956  Age: 60 y.o. MRN: [id number]  CC: Mental Health Problem   HPI Wesley [name] presents for    1. Anxiety and depression: on zoloft 25 mg. Feels improvement in irritability. No SI. Getting counseling from church.   2. Memory loss: improved. With improvement in mood. Labs all normal except vit D low. MMSE done today.   Social History  Substance Use Topics  . Smoking status: Current Some Day Smoker -- 0.25 packs/day for 40 years    Types: Cigarettes  . Smokeless tobacco: Never Used  . Alcohol Use: No     Comment: hx of ETOH abuse from 2001-2016      Outpatient Prescriptions Prior to Visit  Medication Sig Dispense Refill  . acetaminophen (TYLENOL) 500 MG tablet Take 1-2 tablets (500-1,000 mg total) by mouth every 8 (eight) hours as needed. 60 tablet 0  . albuterol (PROVENTIL HFA;VENTOLIN HFA) 108 (90 BASE) MCG/ACT inhaler Inhale 2 puffs into the lungs every 6 (six) hours as needed for wheezing or shortness of breath.    Marland Kitchen amLODipine (NORVASC) 10 MG tablet Take 1 tablet (10 mg total) by mouth daily. 90 tablet 11  . aspirin 81 MG tablet Take 1 tablet (81 mg total) by mouth daily. 30 tablet 2  . atorvastatin (LIPITOR) 20 MG tablet Take 1 tablet (20 mg total) by mouth daily. 30 tablet 5  . Cholecalciferol (VITAMIN D3) 2000 UNITS TABS Take 2,000 Units by mouth daily. 30 tablet 11  . lisinopril (PRINIVIL,ZESTRIL) 20 MG tablet Take 1 tablet (20 mg total) by mouth 2 (two) times daily. 60 tablet 2  . ondansetron (ZOFRAN) 8 MG tablet Take 1 tablet (8 mg total) by mouth every 8 (eight) hours as needed for nausea or vomiting. 20 tablet 0  . sertraline (ZOLOFT) 25 MG tablet Take 1 tablet (25 mg total) by mouth daily. 30 tablet 2  . tamsulosin (FLOMAX) 0.4 MG CAPS capsule Take 1 capsule (0.4 mg total) by mouth daily. 30 capsule 3  . tiotropium  (SPIRIVA HANDIHALER) 18 MCG inhalation capsule Place 1 capsule (18 mcg total) into inhaler and inhale daily. 30 capsule 12   No facility-administered medications prior to visit.    ROS Review of Systems  Constitutional: Negative for fever, chills, fatigue and unexpected weight change.  Eyes: Negative for visual disturbance.  Respiratory: Negative for cough and shortness of breath.   Cardiovascular: Negative for chest pain, palpitations and leg swelling.  Gastrointestinal: Negative for nausea, vomiting, abdominal pain, diarrhea, constipation, blood in stool, anal bleeding and rectal pain.  Endocrine: Negative for polydipsia, polyphagia and polyuria.  Musculoskeletal: Positive for arthralgias. Negative for myalgias, back pain, gait problem and neck pain.  Skin: Negative for rash.  Allergic/Immunologic: Negative for immunocompromised state.  Hematological: Negative for adenopathy. Does not bruise/bleed easily.  Psychiatric/Behavioral: Positive for dysphoric mood. Negative for suicidal ideas and sleep disturbance. The patient is not nervous/anxious.     Objective:  BP 123/76 mmHg  Pulse 59  Temp(Src) 98.3 F (36.8 C) (Oral)  Resp 16  Ht 6\' 2"  (1.88 m)  Wt 191 lb (86.637 kg)  BMI 24.51 kg/m2  SpO2 99%  BP/Weight 12/27/2015 12/20/2015 [id number]  Systolic BP [id number] -  Diastolic BP 76 85 -  Wt. (Lbs) 191 184 184  BMI 24.51 23.61 23.61   MMSE -  Mini Mental State Exam 12/27/2015  Orientation to time 5  Orientation to Place 5  Registration 3  Attention/ Calculation 5  Recall 3  Language- name 2 objects 2  Language- repeat 1  Language- follow 3 step command 3  Language- read & follow direction 1  Write a sentence 1  Copy design 1  Total score 30    Depression screen [facility] 2/9 12/27/2015 11/29/2015 11/01/2015 10/10/2015 10/04/2015  Decreased Interest 2 2 2  0 2  Down, Depressed, Hopeless 2 1 2  0 2  PHQ - 2 Score 4 3 4  0 4  Altered sleeping 2 2 3  - 3  Tired, decreased energy 2 2  2  - 3  Change in appetite 3 3 3  - 3  Feeling bad or failure about yourself  1 2 2  - 2  Trouble concentrating 1 3 1  - 2  Moving slowly or fidgety/restless 1 3 2  - 2  Suicidal thoughts 1 1 1  - 0  PHQ-9 Score 15 19 18  - 19   GAD 7 : Generalized Anxiety Score 12/27/2015 11/29/2015 11/01/2015  Nervous, Anxious, on Edge 2 2 2   Control/stop worrying 1 3 3   Worry too much - different things 2 2 3   Trouble relaxing 1 2 2   Restless 2 2 2   Easily annoyed or irritable 2 3 3   Afraid - awful might happen 1 2 3   Total GAD 7 Score 11 16 18     Physical Exam  Constitutional: He appears well-developed and well-nourished. No distress.  HENT:  Head: Normocephalic and atraumatic.  Neck: Normal range of motion. Neck supple.  Cardiovascular: Normal rate, regular rhythm, normal heart sounds and intact distal pulses.   Pulmonary/Chest: Effort normal and breath sounds normal.  Musculoskeletal: He exhibits no edema.  Neurological: He is alert.  Skin: Skin is warm and dry. No rash noted. No erythema.  Psychiatric: He has a normal mood and affect.     Assessment & Plan:   Problem List Items Addressed This Visit    Anxiety and depression - Primary (Chronic)   Relevant Medications   sertraline (ZOLOFT) 50 MG tablet      No orders of the defined types were placed in this encounter.    Follow-up: No Follow-up on file.   Boykin Nearing MD

## 2015-12-27 NOTE — Progress Notes (Signed)
F/U mental status  Complaining of hand giving up and shooting pain on and off  No pain today  No suicidal thought in the past two week Tobacco user 5 cigarette per week

## 2015-12-27 NOTE — Patient Instructions (Addendum)
Wesley [name] was seen today for mental health problem.  Diagnoses and all orders for this visit:  Anxiety and depression -     sertraline (ZOLOFT) 50 MG tablet; Take 1 tablet (50 mg total) by mouth daily.   Smoking cessation support: smoking cessation hotline: 1-800-QUIT-NOW.  Smoking cessation classes are available through [facility]. Call [phone] or visit our website at [email or web address]   Increase zoloft to 50 mg daily  For R wrist pain: continue heat, add light weights to exercise regimen. Ibuprofen 600 mg as with food up to 3 times a day.   F/u in 8 weeks for anxiety and depressed mood   Dr. Adrian Blackwater

## 2015-12-27 NOTE — Assessment & Plan Note (Signed)
Normal MMSE Suspected memory impairment was due to depressed mood

## 2016-01-16 DIAGNOSIS — H348192 Central retinal vein occlusion, unspecified eye, stable: Secondary | ICD-10-CM

## 2016-01-16 HISTORY — DX: Central retinal vein occlusion, unspecified eye, stable: H34.8192

## 2016-01-18 ENCOUNTER — Emergency Department (INDEPENDENT_AMBULATORY_CARE_PROVIDER_SITE_OTHER): Payer: Medicaid Other

## 2016-01-18 ENCOUNTER — Emergency Department (INDEPENDENT_AMBULATORY_CARE_PROVIDER_SITE_OTHER)
Admission: EM | Admit: 2016-01-18 | Discharge: 2016-01-18 | Disposition: A | Discharge status: Home / Self Care | Payer: Medicaid Other | Source: Home / Self Care | Attending: Family Medicine | Admitting: Family Medicine

## 2016-01-18 ENCOUNTER — Encounter (HOSPITAL_COMMUNITY): Payer: Self-pay | Admitting: Emergency Medicine

## 2016-01-18 DIAGNOSIS — M79671 Pain in right foot: Secondary | ICD-10-CM | POA: Diagnosis not present

## 2016-01-18 DIAGNOSIS — M109 Gout, unspecified: Secondary | ICD-10-CM | POA: Diagnosis not present

## 2016-01-18 MED ORDER — PREDNISONE 50 MG PO TABS: 20.0000 mg | ORAL_TABLET | Freq: Every day | ORAL | Status: DC

## 2016-01-18 MED ORDER — INDOMETHACIN 50 MG PO CAPS: 50.0000 mg | ORAL_CAPSULE | Freq: Two times a day (BID) | ORAL | Status: DC

## 2016-01-18 MED FILL — ?INDOMETHACIN 50 MG CAPSULE: 50 | 15 days supply | Qty: 30 | Fill #0

## 2016-01-18 MED FILL — predniSONE 5 MG TABS: 5 | 6 days supply | Qty: 30 | Fill #0

## 2016-01-18 MED FILL — ?ATORVASTATIN 20 MG TABLET: 20 | 30 days supply | Qty: 30 | Fill #1

## 2016-01-18 MED FILL — ?SERTRALINE HCL 50 MG TABLE: 50 | 30 days supply | Qty: 30 | Fill #0

## 2016-01-18 NOTE — Discharge Instructions (Signed)
Gout Gout is when your joints become red, sore, and swell (inflamed). This is caused by the buildup of uric acid crystals in the joints. Uric acid is a chemical that is normally in the blood. If the level of uric acid gets too high in the blood, these crystals form in your joints and tissues. Over time, these crystals can form into masses near the joints and tissues. These masses can destroy bone and cause the bone to look misshapen (deformed). HOME CARE   Do not take aspirin for pain.  Only take medicine as told by your doctor.  Rest the joint as much as you can. When in bed, keep sheets and blankets off painful areas.  Keep the sore joints raised (elevated).  Put warm or cold packs on painful joints. Use of warm or cold packs depends on which works best for you.  Use crutches if the painful joint is in your leg.  Drink enough fluids to keep your pee (urine) clear or pale yellow. Limit alcohol, sugary drinks, and drinks with fructose in them.  Follow your diet instructions. Pay careful attention to how much protein you eat. Include fruits, vegetables, whole grains, and fat-free or low-fat milk products in your daily diet. Talk to your doctor or dietitian about the use of coffee, vitamin C, and cherries. These may help lower uric acid levels.  Keep a healthy body weight. GET HELP RIGHT AWAY IF:   You have watery poop (diarrhea), throw up (vomit), or have any side effects from medicines.  You do not feel better in 24 hours, or you are getting worse.  Your joint becomes suddenly more tender, and you have chills or a fever. MAKE SURE YOU:   Understand these instructions.  Will watch your condition.  Will get help right away if you are not doing well or get worse.   This information is not intended to replace advice given to you by your health care provider. Make sure you discuss any questions you have with your health care provider.   Document Released: 09/09/2008 Document Revised:  12/22/2014 Document Reviewed: 07/14/2012 Elsevier Interactive Patient Education 2016 [place] therapy can help ease sore, stiff, injured, and tight muscles and joints. Heat relaxes your muscles, which may help ease your pain. Heat therapy should only be used on old, pre-existing, or long-lasting (chronic) injuries. Do not use heat therapy unless told by your doctor. HOW TO USE HEAT THERAPY There are several different kinds of heat therapy, including:  Moist heat pack.  Warm water bath.  Hot water bottle.  Electric heating pad.  Heated gel pack.  Heated wrap.  Electric heating pad. GENERAL HEAT THERAPY RECOMMENDATIONS   Do not sleep while using heat therapy. Only use heat therapy while you are awake.  Your skin may turn pink while using heat therapy. Do not use heat therapy if your skin turns red.  Do not use heat therapy if you have new pain.  High heat or long exposure to heat can cause burns. Be careful when using heat therapy to avoid burning your skin.  Do not use heat therapy on areas of your skin that are already irritated, such as with a rash or sunburn. GET HELP IF:   You have blisters, redness, swelling (puffiness), or numbness.  You have new pain.  Your pain is worse. MAKE SURE YOU:  Understand these instructions.  Will watch your condition.  Will get help right away if you are not doing  well or get worse.   This information is not intended to replace advice given to you by your health care provider. Make sure you discuss any questions you have with your health care provider.   Document Released: 02/23/2012 Document Revised: 12/22/2014 Document Reviewed: 01/24/2014 Elsevier Interactive Patient Education [employer].

## 2016-01-18 NOTE — ED Notes (Signed)
Wesley [name], d/c pt
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
## 2016-01-18 NOTE — ED Notes (Signed)
C/o right foot pain and swelling onset Monday morning when he woke up Denies inj/trauma.... Sunday night he was fine Pain increases w/activity A&O x4... No acute distress.

## 2016-01-23 ENCOUNTER — Encounter: Payer: Medicaid Other | Admitting: *Deleted

## 2016-01-31 ENCOUNTER — Telehealth: Payer: Self-pay | Admitting: Family Medicine

## 2016-01-31 DIAGNOSIS — H269 Unspecified cataract: Secondary | ICD-10-CM

## 2016-01-31 NOTE — Telephone Encounter (Signed)
Pt. Has been approved for Medicaid....pt. Would like to know if he can be referred to a specialist who can see him for the cataract on his eye....please follow up with patient

## 2016-02-01 NOTE — Telephone Encounter (Signed)
Ophthalmology referral placed.

## 2016-02-04 ENCOUNTER — Encounter: Payer: Self-pay | Admitting: Family Medicine

## 2016-02-08 ENCOUNTER — Encounter: Payer: Self-pay | Admitting: Clinical

## 2016-02-08 NOTE — Progress Notes (Signed)
Depression screen [facility] 2/9 12/27/2015 11/29/2015 11/01/2015 10/10/2015 10/04/2015  Decreased Interest 2 2 2  0 2  Down, Depressed, Hopeless 2 1 2  0 2  PHQ - 2 Score 4 3 4  0 4  Altered sleeping 2 2 3  - 3  Tired, decreased energy 2 2 2  - 3  Change in appetite 3 3 3  - 3  Feeling bad or failure about yourself  1 2 2  - 2  Trouble concentrating 1 3 1  - 2  Moving slowly or fidgety/restless 1 3 2  - 2  Suicidal thoughts 1 1 1  - 0  PHQ-9 Score 15 19 18  - 19    GAD 7 : Generalized Anxiety Score 12/27/2015 11/29/2015 11/01/2015  Nervous, Anxious, on Edge 2 2 2   Control/stop worrying 1 3 3   Worry too much - different things 2 2 3   Trouble relaxing 1 2 2   Restless 2 2 2   Easily annoyed or irritable 2 3 3   Afraid - awful might happen 1 2 3   Total GAD 7 Score 11 16 18

## 2016-02-14 ENCOUNTER — Encounter: Payer: Self-pay | Admitting: Family Medicine

## 2016-02-14 DIAGNOSIS — H348312 Tributary (branch) retinal vein occlusion, right eye, stable: Secondary | ICD-10-CM | POA: Insufficient documentation

## 2016-02-14 NOTE — Assessment & Plan Note (Addendum)
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 Katy Fitch who has referred patient to retinal specialist, Dr. Zadie Rhine

## 2016-02-14 NOTE — Progress Notes (Signed)
Patient ID: Wesley [name], male   DOB: June 10, 1956, 60 y.o.   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 Katy Fitch who has referred patient to retinal specialist, Dr. Zadie Rhine.  B/l cataracts. Planning for cataract surgery starting on the R side.

## 2016-02-18 ENCOUNTER — Telehealth: Payer: Self-pay | Admitting: Cardiovascular Disease

## 2016-02-18 MED FILL — DUREZOL 0.05% EYE DROPS: 0.05 | 25 days supply | Qty: 5 | Fill #0

## 2016-02-18 MED FILL — BESIVANCE 0.6% SUSP: 0.6 | 7 days supply | Qty: 5 | Fill #0

## 2016-02-18 MED FILL — TAMSULOSIN HCL 0.4 MG CAP: 0.4 | 30 days supply | Qty: 30 | Fill #1

## 2016-02-18 MED FILL — SERTRALINE HCL 50 MG TABLET: 50 | 30 days supply | Qty: 30 | Fill #1

## 2016-02-18 MED FILL — PROLENSA 0.07% EYE DROPS: 0.07 | 30 days supply | Qty: 3 | Fill #0

## 2016-02-18 NOTE — Telephone Encounter (Signed)
Informed patient that remote was not received. Patient stated that he needed help with sending the remote. Verbal instructions given.  Plan to call back to let patient know if transmission was received.

## 2016-02-18 NOTE — Telephone Encounter (Signed)
Pt is doing his pacer check this afternoon,she will need the tracing asap after he does it.Please fax to [phone].Device clinic is in [place] this week.

## 2016-02-19 NOTE — Telephone Encounter (Signed)
Attempted to call patient to inform him that his remote was never received. N/A-unable to LM.

## 2016-02-19 NOTE — Telephone Encounter (Signed)
Informed patient that remote was received. Patient voiced understanding. 

## 2016-02-19 NOTE — Telephone Encounter (Signed)
Attempted call x 1- N/A- unable to LM

## 2016-02-19 NOTE — Telephone Encounter (Signed)
LM informing Blanch Media that pt's remote has been faxed to their office. Encouraged Blanch Media to call with any questions.

## 2016-02-19 NOTE — Telephone Encounter (Signed)
Follow up     Pt called back to get help sending a remote transmission.  He is scheduled for cataract surgery tomorrow and they need the reading before he can have surgery.  Please call as soon as you can so that you can help him with the transmission.

## 2016-02-26 ENCOUNTER — Telehealth: Payer: Self-pay | Admitting: Family Medicine

## 2016-02-26 NOTE — Telephone Encounter (Signed)
Patient would like to know if could get a referral for an Orthopedic....please follow up

## 2016-03-05 NOTE — Telephone Encounter (Signed)
Phone number disconnected   Pt needs OV

## 2016-03-06 ENCOUNTER — Telehealth: Payer: Self-pay | Admitting: Family Medicine

## 2016-03-06 NOTE — Telephone Encounter (Signed)
Patient following up from last call regarding possible referral to the orthopedic for feet and ankle problems

## 2016-03-14 NOTE — Telephone Encounter (Signed)
Pt has f/u appointment with PCP on 03/17/2016

## 2016-03-17 ENCOUNTER — Encounter: Payer: Self-pay | Admitting: Family Medicine

## 2016-03-17 ENCOUNTER — Encounter: Payer: Self-pay | Admitting: Clinical

## 2016-03-17 ENCOUNTER — Ambulatory Visit: Payer: Medicaid Other | Attending: Family Medicine | Admitting: Family Medicine

## 2016-03-17 VITALS — BP 127/79 | HR 60 | Temp 98.2°F | Resp 16 | Ht 74.0 in | Wt 200.0 lb

## 2016-03-17 DIAGNOSIS — I10 Essential (primary) hypertension: Secondary | ICD-10-CM | POA: Diagnosis not present

## 2016-03-17 DIAGNOSIS — Z7982 Long term (current) use of aspirin: Secondary | ICD-10-CM | POA: Diagnosis not present

## 2016-03-17 DIAGNOSIS — F419 Anxiety disorder, unspecified: Secondary | ICD-10-CM

## 2016-03-17 DIAGNOSIS — M25532 Pain in left wrist: Secondary | ICD-10-CM

## 2016-03-17 DIAGNOSIS — F17210 Nicotine dependence, cigarettes, uncomplicated: Secondary | ICD-10-CM | POA: Insufficient documentation

## 2016-03-17 DIAGNOSIS — Z79899 Other long term (current) drug therapy: Secondary | ICD-10-CM | POA: Diagnosis not present

## 2016-03-17 DIAGNOSIS — F329 Major depressive disorder, single episode, unspecified: Secondary | ICD-10-CM | POA: Insufficient documentation

## 2016-03-17 DIAGNOSIS — G252 Other specified forms of tremor: Secondary | ICD-10-CM | POA: Diagnosis not present

## 2016-03-17 DIAGNOSIS — G8929 Other chronic pain: Secondary | ICD-10-CM | POA: Diagnosis not present

## 2016-03-17 DIAGNOSIS — M79671 Pain in right foot: Secondary | ICD-10-CM | POA: Insufficient documentation

## 2016-03-17 DIAGNOSIS — M25531 Pain in right wrist: Secondary | ICD-10-CM

## 2016-03-17 DIAGNOSIS — F418 Other specified anxiety disorders: Secondary | ICD-10-CM | POA: Diagnosis not present

## 2016-03-17 DIAGNOSIS — R413 Other amnesia: Secondary | ICD-10-CM | POA: Insufficient documentation

## 2016-03-17 DIAGNOSIS — K1379 Other lesions of oral mucosa: Secondary | ICD-10-CM | POA: Insufficient documentation

## 2016-03-17 DIAGNOSIS — K0889 Other specified disorders of teeth and supporting structures: Secondary | ICD-10-CM

## 2016-03-17 LAB — URIC ACID: URIC ACID, SERUM: 5.1 mg/dL (ref 4.0–7.8)

## 2016-03-17 MED ORDER — WRIST SPLINT/ELASTIC LEFT LG MISC: 1.0000 | Freq: Every day | Status: DC

## 2016-03-17 MED ORDER — AMLODIPINE BESYLATE 10 MG PO TABS: 10.0000 mg | ORAL_TABLET | Freq: Every day | ORAL | Status: DC

## 2016-03-17 MED ORDER — SERTRALINE HCL 100 MG PO TABS: 100.0000 mg | ORAL_TABLET | Freq: Every day | ORAL | Status: DC

## 2016-03-17 MED ORDER — LISINOPRIL 40 MG PO TABS: 40.0000 mg | ORAL_TABLET | Freq: Every day | ORAL | Status: DC

## 2016-03-17 MED ORDER — WRIST SPLINT/ELASTIC RIGHT LG MISC: 1.0000 | Freq: Every day | Status: DC

## 2016-03-17 MED ORDER — INDOMETHACIN 50 MG PO CAPS: 50.0000 mg | ORAL_CAPSULE | Freq: Two times a day (BID) | ORAL | Status: DC

## 2016-03-17 MED FILL — INDOMETHACIN 50 MG CAPSULE: 50 | 15 days supply | Qty: 30 | Fill #0

## 2016-03-17 MED FILL — LISINOPRIL 40 MG TABLET: 40 | 30 days supply | Qty: 30 | Fill #0

## 2016-03-17 MED FILL — ATORVASTATIN 20 MG TABLET: 20 | 30 days supply | Qty: 30 | Fill #2

## 2016-03-17 MED FILL — SERTRALINE HCL 100 MG TAB: 100 | 30 days supply | Qty: 30 | Fill #0

## 2016-03-17 MED FILL — AMLODIPINE BESYLATE 10 MG T: 10 | 30 days supply | Qty: 30 | Fill #1

## 2016-03-17 NOTE — Patient Instructions (Addendum)
Tien was seen today for follow-up, depression and gout.  Diagnoses and all orders for this visit:  Chronic pain in right foot -     Ambulatory referral to Podiatry -     indomethacin (INDOCIN) 50 MG capsule; Take 1 capsule (50 mg total) by mouth 2 (two) times daily with a meal. -     Uric Acid  Anxiety and depression -     sertraline (ZOLOFT) 100 MG tablet; Take 1 tablet (100 mg total) by mouth daily.  Pain of molar -     Ambulatory referral to Dentistry  Intention tremor -     Ambulatory referral to Neurology  Essential hypertension -     lisinopril (PRINIVIL,ZESTRIL) 40 MG tablet; Take 1 tablet (40 mg total) by mouth daily. -     amLODipine (NORVASC) 10 MG tablet; Take 1 tablet (10 mg total) by mouth daily.  Memory loss -     Ambulatory referral to Neurology  Pain in both wrists -     Elastic Bandages & Supports (WRIST SPLINT/ELASTIC LEFT LG) MISC; 1 each by Does not apply route at bedtime. -     Elastic Bandages & Supports (WRIST SPLINT/ELASTIC RIGHT LG) MISC; 1 each by Does not apply route at bedtime.   F/u in 6 weeks for anxiety and depression, wrist pain, foot pain  Dr. Adrian Blackwater

## 2016-03-17 NOTE — Assessment & Plan Note (Signed)
A: anxiety and depression. Persistent. P: Increase zoloft to 100 mg daily

## 2016-03-17 NOTE — Progress Notes (Signed)
Depression screen [facility] 2/9 03/17/2016 12/27/2015 11/29/2015 11/01/2015 10/10/2015  Decreased Interest 2 2 2 2  0  Down, Depressed, Hopeless 2 2 1 2  0  PHQ - 2 Score 4 4 3 4  0  Altered sleeping 2 2 2 3  -  Tired, decreased energy 2 2 2 2  -  Change in appetite 2 3 3 3  -  Feeling bad or failure about yourself  2 1 2 2  -  Trouble concentrating 1 1 3 1  -  Moving slowly or fidgety/restless 2 1 3 2  -  Suicidal thoughts 1 1 1 1  -  PHQ-9 Score 16 15 19 18  -   *No plans to end life in past two weeks  GAD 7 : Generalized Anxiety Score 03/17/2016 12/27/2015 11/29/2015 11/01/2015  Nervous, Anxious, on Edge 2 2 2 2   Control/stop worrying 2 1 3 3   Worry too much - different things 2 2 2 3   Trouble relaxing 2 1 2 2   Restless 3 2 2 2   Easily annoyed or irritable 3 2 3 3   Afraid - awful might happen 2 1 2 3   Total GAD 7 Score 16 11 16  18

## 2016-03-17 NOTE — Progress Notes (Signed)
Patient ID: Wesley [name], male   DOB: 1956/02/06, 60 y.o.   MRN: [id number]   Subjective:  Patient ID: Wesley [name], male    DOB: 05/19/1956  Age: 60 y.o. MRN: [id number]  CC: Follow-up; Depression; and Gout   HPI Wesley [name] presents with his wife for follow up appointment. He has multiple complaints    1. Anxiety and depression: stable. Taking zoloft 50 mg daily. Admits to depressed mood and anxiety.   2. Memory loss: improved. With improvement in mood. He has taking vitamin D supplement.  3. Tremor: for past 10-15 years. Tremor in L and R hand. Tremor comes and goes. It is worse with action like holding a cup or holding a microphone. It is worsened by anxiety. It occurs in public and at home. He denies ETOH. He denies family history of movement disorder and neurological disorders.  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 He is concerned that he was exposed to contaminated drinking water. He has recently established care at the [place].   4. Wrist pain: pain in both wrist. For past 10 years or so. No known trauma. He has mild about of swelling. Pain is worse in ulnar surface of L wrist. He also has numbness in fingers of L hand.   5. R foot pain: x 20 years. Reports being in [place] in 1978. He has R foot x-ray. He had a foot x-ray in 01/18/2016 that was normal except for hallux valgus. Pain comes and goes. Is severe at times. When it is severe there is mild swelling. No significant redness.   Social History  Substance Use Topics  . Smoking status: Current Some Day Smoker -- 0.05 packs/day for 40 years    Types: Cigarettes  . Smokeless tobacco: Never Used  . Alcohol Use: No     Comment: hx of ETOH abuse from 2001-2016      Outpatient Prescriptions Prior to Visit  Medication Sig Dispense Refill  . acetaminophen (TYLENOL) 500 MG tablet Take 1-2 tablets (500-1,000 mg total) by mouth every 8 (eight) hours as needed. 60 tablet 0  . albuterol  (PROVENTIL HFA;VENTOLIN HFA) 108 (90 BASE) MCG/ACT inhaler Inhale 2 puffs into the lungs every 6 (six) hours as needed for wheezing or shortness of breath.    Marland Kitchen amLODipine (NORVASC) 10 MG tablet Take 1 tablet (10 mg total) by mouth daily. 90 tablet 11  . aspirin 81 MG tablet Take 1 tablet (81 mg total) by mouth daily. 30 tablet 2  . atorvastatin (LIPITOR) 20 MG tablet Take 1 tablet (20 mg total) by mouth daily. 30 tablet 5  . Cholecalciferol (VITAMIN D3) 2000 UNITS TABS Take 2,000 Units by mouth daily. 30 tablet 11  . indomethacin (INDOCIN) 50 MG capsule Take 1 capsule (50 mg total) by mouth 2 (two) times daily with a meal. 30 capsule 0  . lisinopril (PRINIVIL,ZESTRIL) 20 MG tablet Take 1 tablet (20 mg total) by mouth 2 (two) times daily. 60 tablet 2  . ondansetron (ZOFRAN) 8 MG tablet Take 1 tablet (8 mg total) by mouth every 8 (eight) hours as needed for nausea or vomiting. 20 tablet 0  . predniSONE (DELTASONE) 50 MG tablet Take 0.5 tablets (25 mg total) by mouth daily. 3 tablet 0  . sertraline (ZOLOFT) 50 MG tablet Take 1 tablet (50 mg total) by mouth daily. 30 tablet 3  . tamsulosin (FLOMAX) 0.4 MG CAPS capsule Take 1 capsule (0.4 mg total) by mouth  daily. 30 capsule 3  . tiotropium (SPIRIVA HANDIHALER) 18 MCG inhalation capsule Place 1 capsule (18 mcg total) into inhaler and inhale daily. 30 capsule 12   No facility-administered medications prior to visit.    ROS Review of Systems  Constitutional: Negative for fever, chills, fatigue and unexpected weight change.  HENT: Positive for dental problem.   Eyes: Positive for pain (R eye, recent surgery ). Negative for visual disturbance.  Respiratory: Negative for cough and shortness of breath.   Cardiovascular: Negative for chest pain, palpitations and leg swelling.  Gastrointestinal: Negative for nausea, vomiting, abdominal pain, diarrhea, constipation, blood in stool, anal bleeding and rectal pain.  Endocrine: Negative for polydipsia,  polyphagia and polyuria.  Musculoskeletal: Positive for arthralgias. Negative for myalgias, back pain, gait problem and neck pain.  Skin: Negative for rash.  Allergic/Immunologic: Negative for immunocompromised state.  Neurological: Positive for numbness.  Hematological: Negative for adenopathy. Does not bruise/bleed easily.  Psychiatric/Behavioral: Positive for dysphoric mood. Negative for suicidal ideas and sleep disturbance. The patient is not nervous/anxious.     Objective:  BP 127/79 mmHg  Pulse 60  Temp(Src) 98.2 F (36.8 C) (Oral)  Resp 16  Ht 6\' 2"  (1.88 m)  Wt 200 lb (90.719 kg)  BMI 25.67 kg/m2  SpO2 97%  BP/Weight 03/17/2016 01/18/2016 [id number]  Systolic BP [id number]  Diastolic BP 79 73 76  Wt. (Lbs) 200 - 191  BMI 25.67 - 24.51   MMSE - Mini Mental State Exam 12/27/2015  Orientation to time 5  Orientation to Place 5  Registration 3  Attention/ Calculation 5  Recall 3  Language- name 2 objects 2  Language- repeat 1  Language- follow 3 step command 3  Language- read & follow direction 1  Write a sentence 1  Copy design 1  Total score 30    Depression screen [facility] 2/9 03/17/2016 12/27/2015 11/29/2015 11/01/2015 10/10/2015  Decreased Interest 2 2 2 2  0  Down, Depressed, Hopeless 2 2 1 2  0  PHQ - 2 Score 4 4 3 4  0  Altered sleeping 2 2 2 3  -  Tired, decreased energy 2 2 2 2  -  Change in appetite 2 3 3 3  -  Feeling bad or failure about yourself  2 1 2 2  -  Trouble concentrating 1 1 3 1  -  Moving slowly or fidgety/restless 2 1 3 2  -  Suicidal thoughts 1 1 1 1  -  PHQ-9 Score 16 15 19 18  -   GAD 7 : Generalized Anxiety Score 03/17/2016 12/27/2015 11/29/2015 11/01/2015  Nervous, Anxious, on Edge 2 2 2 2   Control/stop worrying 2 1 3 3   Worry too much - different things 2 2 2 3   Trouble relaxing 2 1 2 2   Restless 3 2 2 2   Easily annoyed or irritable 3 2 3 3   Afraid - awful might happen 2 1 2 3   Total GAD 7 Score 16 11 16 18     Physical Exam  Constitutional: He  appears well-developed and well-nourished. No distress.  HENT:  Head: Normocephalic and atraumatic.  Neck: Normal range of motion. Neck supple.  Cardiovascular: Normal rate, regular rhythm, normal heart sounds and intact distal pulses.   Pulmonary/Chest: Effort normal and breath sounds normal.  Musculoskeletal: He exhibits no edema or tenderness.  Neurological: He is alert. He displays normal reflexes. No cranial nerve deficit. He exhibits normal muscle tone. Coordination normal.  Skin: Skin is warm and dry. No rash noted. No erythema.  Psychiatric:  He has a normal mood and affect.   Assessment & Plan:   Problem List Items Addressed This Visit    Pain of molar   Relevant Orders   Ambulatory referral to Dentistry   Memory loss   Relevant Orders   Ambulatory referral to Neurology   Hypertension (Chronic)   Relevant Medications   lisinopril (PRINIVIL,ZESTRIL) 40 MG tablet   amLODipine (NORVASC) 10 MG tablet   Chronic pain in right foot - Primary   Relevant Medications   indomethacin (INDOCIN) 50 MG capsule   Other Relevant Orders   Ambulatory referral to Podiatry   Uric Acid   Anxiety and depression (Chronic)   Relevant Medications   sertraline (ZOLOFT) 100 MG tablet    Other Visit Diagnoses    Intention tremor        Relevant Orders    Ambulatory referral to Neurology    Pain in both wrists        Relevant Medications    Elastic Bandages & Supports (WRIST SPLINT/ELASTIC LEFT LG) MISC    Elastic Bandages & Supports (WRIST SPLINT/ELASTIC RIGHT LG) MISC       No orders of the defined types were placed in this encounter.    Follow-up: No Follow-up on file.   Boykin Nearing MD

## 2016-03-17 NOTE — Progress Notes (Signed)
F/U depression and anxiety  C/C GOUT on feet and wrist  Pain scale # 3 shooting pain on wrist unable to hold stuff  Tobacco user -1-2 cigarette per day

## 2016-03-17 NOTE — Assessment & Plan Note (Signed)
Longstanding tremor with reported memory deficits, depression. Possible exposure to contaminated drinking water, benzene. Recent labs including TSH normal.   Neurology referral placed

## 2016-03-17 NOTE — Assessment & Plan Note (Signed)
Dental referral and medicaid dental list provided

## 2016-03-17 NOTE — Assessment & Plan Note (Signed)
A: chronic pain in R foot. Normal exam today. ? Gout  P:  Podiatry referral Check uric acid

## 2016-03-18 ENCOUNTER — Telehealth: Payer: Self-pay | Admitting: *Deleted

## 2016-03-18 NOTE — Telephone Encounter (Signed)
Left message with wife to return call.

## 2016-03-18 NOTE — Telephone Encounter (Signed)
Date of birth verified by pt Normal uric acid results given  Pt verbalized understanding

## 2016-03-18 NOTE — Telephone Encounter (Signed)
-----   Message from Boykin Nearing, MD sent at 03/17/2016  7:05 PM EDT ----- Uric acid normal

## 2016-03-18 NOTE — Telephone Encounter (Signed)
Pt. Returned call. Please f/u with pt. °

## 2016-03-31 ENCOUNTER — Ambulatory Visit (INDEPENDENT_AMBULATORY_CARE_PROVIDER_SITE_OTHER): Payer: Medicaid Other | Admitting: Podiatry

## 2016-03-31 ENCOUNTER — Encounter: Payer: Self-pay | Admitting: Podiatry

## 2016-03-31 ENCOUNTER — Encounter: Payer: Self-pay | Admitting: Neurology

## 2016-03-31 ENCOUNTER — Ambulatory Visit (INDEPENDENT_AMBULATORY_CARE_PROVIDER_SITE_OTHER): Payer: Medicaid Other

## 2016-03-31 ENCOUNTER — Ambulatory Visit (INDEPENDENT_AMBULATORY_CARE_PROVIDER_SITE_OTHER): Payer: Medicaid Other | Admitting: Neurology

## 2016-03-31 VITALS — BP 126/81 | HR 60 | Resp 16 | Ht 73.5 in | Wt 202.0 lb

## 2016-03-31 VITALS — BP 114/79 | HR 60 | Ht 74.0 in | Wt 202.0 lb

## 2016-03-31 DIAGNOSIS — G250 Essential tremor: Secondary | ICD-10-CM | POA: Diagnosis not present

## 2016-03-31 DIAGNOSIS — M722 Plantar fascial fibromatosis: Secondary | ICD-10-CM

## 2016-03-31 DIAGNOSIS — G4700 Insomnia, unspecified: Secondary | ICD-10-CM | POA: Diagnosis not present

## 2016-03-31 DIAGNOSIS — S93601A Unspecified sprain of right foot, initial encounter: Secondary | ICD-10-CM | POA: Diagnosis not present

## 2016-03-31 DIAGNOSIS — M79671 Pain in right foot: Secondary | ICD-10-CM

## 2016-03-31 DIAGNOSIS — F5104 Psychophysiologic insomnia: Secondary | ICD-10-CM

## 2016-03-31 DIAGNOSIS — R413 Other amnesia: Secondary | ICD-10-CM | POA: Diagnosis not present

## 2016-03-31 DIAGNOSIS — M779 Enthesopathy, unspecified: Secondary | ICD-10-CM

## 2016-03-31 HISTORY — DX: Essential tremor: G25.0

## 2016-03-31 HISTORY — DX: Psychophysiologic insomnia: F51.04

## 2016-03-31 MED ORDER — TRIAMCINOLONE ACETONIDE 10 MG/ML IJ SUSP
10.0000 mg | Freq: Once | INTRAMUSCULAR | Status: AC
Start: 2016-03-31 — End: 2016-03-31
  Administered 2016-03-31: 10 mg
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
## 2016-03-31 NOTE — Progress Notes (Signed)
   Subjective:    Patient ID: Wesley [name], male    DOB: 1956-05-23, 60 y.o.   MRN: [id number]  HPI Patient presents with foot pain in their right foot; arch-medial side near heel; pt stated, "hurts to walk on foot";  x1 yr  Patient also presents with a nail problem on their right foot; 4th toe. Pt stated, "when nail touches 5th toe, it hurts".   Review of Systems  HENT: Positive for tinnitus.   Respiratory: Positive for shortness of breath and wheezing.   Genitourinary: Positive for difficulty urinating.  Neurological: Positive for weakness.  Psychiatric/Behavioral: The patient is nervous/anxious.   All other systems reviewed and are negative.      Objective:   Physical Exam        Assessment & Plan:

## 2016-04-02 NOTE — Progress Notes (Signed)
Subjective:     Patient ID: Wesley [name], male   DOB: Jul 29, 1956, 60 y.o.   MRN: [id number]  HPI patient states she's developed pain in his mid arch region right and that it's been sore and that it's been making it gradually difficult for him to walk comfortably. Patient states that he's not sure of an injury and it's been going on for a few months   Review of Systems  All other systems reviewed and are negative.      Objective:   Physical Exam  Constitutional: He is oriented to person, place, and time.  Cardiovascular: Intact distal pulses.   Musculoskeletal: Normal range of motion.  Neurological: He is oriented to person, place, and time.  Skin: Skin is warm.  Nursing note and vitals reviewed.  neurovascular status found to be intact muscle strength adequate range of motion within normal limits with patient found to have exquisite discomfort mid arch area right within the plantar fascia. Patient's found have good digital perfusion well oriented with mild depression of the arch     Assessment:     Acute plantar fasciitis right mid arch area    Plan:     H&P x-ray reviewed and injected the mid arch area 3 motor Kenalog 5 mill grams Xylocaine and instructed on physical therapy supportive shoe gear usage. Reappoint as needed  X-ray report negative for signs of fracture with mild calcification of arterial structure which I explained to patient

## 2016-04-04 ENCOUNTER — Emergency Department (HOSPITAL_COMMUNITY): Payer: Medicaid Other

## 2016-04-04 ENCOUNTER — Inpatient Hospital Stay (HOSPITAL_COMMUNITY)
Admission: EM | Admit: 2016-04-04 | Discharge: 2016-04-06 | DRG: 312 | Disposition: A | Discharge status: Home / Self Care | Payer: Medicaid Other | Attending: Internal Medicine | Admitting: Internal Medicine

## 2016-04-04 ENCOUNTER — Encounter (HOSPITAL_COMMUNITY): Payer: Self-pay | Admitting: Emergency Medicine

## 2016-04-04 DIAGNOSIS — G4700 Insomnia, unspecified: Secondary | ICD-10-CM | POA: Diagnosis not present

## 2016-04-04 DIAGNOSIS — N400 Enlarged prostate without lower urinary tract symptoms: Secondary | ICD-10-CM | POA: Diagnosis present

## 2016-04-04 DIAGNOSIS — J811 Chronic pulmonary edema: Secondary | ICD-10-CM | POA: Diagnosis present

## 2016-04-04 DIAGNOSIS — R55 Syncope and collapse: Secondary | ICD-10-CM | POA: Diagnosis not present

## 2016-04-04 DIAGNOSIS — I252 Old myocardial infarction: Secondary | ICD-10-CM | POA: Diagnosis not present

## 2016-04-04 DIAGNOSIS — F17210 Nicotine dependence, cigarettes, uncomplicated: Secondary | ICD-10-CM | POA: Diagnosis present

## 2016-04-04 DIAGNOSIS — J449 Chronic obstructive pulmonary disease, unspecified: Secondary | ICD-10-CM | POA: Diagnosis present

## 2016-04-04 DIAGNOSIS — Y998 Other external cause status: Secondary | ICD-10-CM | POA: Diagnosis not present

## 2016-04-04 DIAGNOSIS — Y9389 Activity, other specified: Secondary | ICD-10-CM | POA: Diagnosis not present

## 2016-04-04 DIAGNOSIS — Y92410 Unspecified street and highway as the place of occurrence of the external cause: Secondary | ICD-10-CM | POA: Diagnosis not present

## 2016-04-04 DIAGNOSIS — F329 Major depressive disorder, single episode, unspecified: Secondary | ICD-10-CM | POA: Diagnosis present

## 2016-04-04 DIAGNOSIS — R000 Tachycardia, unspecified: Secondary | ICD-10-CM | POA: Diagnosis present

## 2016-04-04 DIAGNOSIS — I959 Hypotension, unspecified: Secondary | ICD-10-CM | POA: Diagnosis present

## 2016-04-04 DIAGNOSIS — Z83511 Family history of glaucoma: Secondary | ICD-10-CM

## 2016-04-04 DIAGNOSIS — E860 Dehydration: Secondary | ICD-10-CM | POA: Diagnosis present

## 2016-04-04 DIAGNOSIS — I9589 Other hypotension: Secondary | ICD-10-CM

## 2016-04-04 DIAGNOSIS — Z809 Family history of malignant neoplasm, unspecified: Secondary | ICD-10-CM | POA: Diagnosis not present

## 2016-04-04 DIAGNOSIS — Z9889 Other specified postprocedural states: Secondary | ICD-10-CM | POA: Diagnosis not present

## 2016-04-04 DIAGNOSIS — M1990 Unspecified osteoarthritis, unspecified site: Secondary | ICD-10-CM | POA: Diagnosis not present

## 2016-04-04 DIAGNOSIS — N289 Disorder of kidney and ureter, unspecified: Secondary | ICD-10-CM | POA: Diagnosis present

## 2016-04-04 DIAGNOSIS — G250 Essential tremor: Secondary | ICD-10-CM | POA: Diagnosis present

## 2016-04-04 DIAGNOSIS — F17200 Nicotine dependence, unspecified, uncomplicated: Secondary | ICD-10-CM | POA: Diagnosis present

## 2016-04-04 DIAGNOSIS — Z950 Presence of cardiac pacemaker: Secondary | ICD-10-CM | POA: Diagnosis present

## 2016-04-04 DIAGNOSIS — F5104 Psychophysiologic insomnia: Secondary | ICD-10-CM | POA: Diagnosis present

## 2016-04-04 DIAGNOSIS — Z880 Allergy status to penicillin: Secondary | ICD-10-CM

## 2016-04-04 DIAGNOSIS — S8002XA Contusion of left knee, initial encounter: Secondary | ICD-10-CM | POA: Diagnosis not present

## 2016-04-04 DIAGNOSIS — H269 Unspecified cataract: Secondary | ICD-10-CM | POA: Diagnosis present

## 2016-04-04 DIAGNOSIS — Z8611 Personal history of tuberculosis: Secondary | ICD-10-CM | POA: Diagnosis not present

## 2016-04-04 DIAGNOSIS — R002 Palpitations: Secondary | ICD-10-CM | POA: Diagnosis not present

## 2016-04-04 DIAGNOSIS — S63502A Unspecified sprain of left wrist, initial encounter: Secondary | ICD-10-CM | POA: Diagnosis not present

## 2016-04-04 DIAGNOSIS — E785 Hyperlipidemia, unspecified: Secondary | ICD-10-CM | POA: Diagnosis present

## 2016-04-04 DIAGNOSIS — Z79899 Other long term (current) drug therapy: Secondary | ICD-10-CM | POA: Diagnosis not present

## 2016-04-04 DIAGNOSIS — S8992XA Unspecified injury of left lower leg, initial encounter: Secondary | ICD-10-CM | POA: Diagnosis present

## 2016-04-04 DIAGNOSIS — I10 Essential (primary) hypertension: Secondary | ICD-10-CM | POA: Diagnosis present

## 2016-04-04 DIAGNOSIS — Z7982 Long term (current) use of aspirin: Secondary | ICD-10-CM

## 2016-04-04 DIAGNOSIS — R198 Other specified symptoms and signs involving the digestive system and abdomen: Secondary | ICD-10-CM

## 2016-04-04 LAB — CBC WITH DIFFERENTIAL/PLATELET
BASOS ABS: 0 10*3/uL (ref 0.0–0.1)
Basophils Relative: 1 %
EOS PCT: 2 %
Eosinophils Absolute: 0.1 10*3/uL (ref 0.0–0.7)
HEMATOCRIT: 44.7 % (ref 39.0–52.0)
HEMOGLOBIN: 14.5 g/dL (ref 13.0–17.0)
LYMPHS ABS: 3 10*3/uL (ref 0.7–4.0)
LYMPHS PCT: 43 %
MCH: 27.1 pg (ref 26.0–34.0)
MCHC: 32.4 g/dL (ref 30.0–36.0)
MCV: 83.4 fL (ref 78.0–100.0)
Monocytes Absolute: 0.4 10*3/uL (ref 0.1–1.0)
Monocytes Relative: 5 %
NEUTROS ABS: 3.4 10*3/uL (ref 1.7–7.7)
Neutrophils Relative %: 49 %
PLATELETS: 216 10*3/uL (ref 150–400)
RBC: 5.36 MIL/uL (ref 4.22–5.81)
RDW: 14.5 % (ref 11.5–15.5)
WBC: 6.9 10*3/uL (ref 4.0–10.5)

## 2016-04-04 LAB — COMPREHENSIVE METABOLIC PANEL
ALK PHOS: 93 U/L (ref 38–126)
ALT: 27 U/L (ref 17–63)
AST: 16 U/L (ref 15–41)
Albumin: 4.7 g/dL (ref 3.5–5.0)
Anion gap: 13 (ref 5–15)
BILIRUBIN TOTAL: 0.6 mg/dL (ref 0.3–1.2)
BUN: 25 mg/dL — AB (ref 6–20)
CALCIUM: 9.6 mg/dL (ref 8.9–10.3)
CO2: 21 mmol/L — ABNORMAL LOW (ref 22–32)
CREATININE: 1.26 mg/dL — AB (ref 0.61–1.24)
Chloride: 105 mmol/L (ref 101–111)
Glucose, Bld: 123 mg/dL — ABNORMAL HIGH (ref 65–99)
Potassium: 3.7 mmol/L (ref 3.5–5.1)
Sodium: 139 mmol/L (ref 135–145)
TOTAL PROTEIN: 7.9 g/dL (ref 6.5–8.1)

## 2016-04-04 LAB — BRAIN NATRIURETIC PEPTIDE: B Natriuretic Peptide: 8.2 pg/mL (ref 0.0–100.0)

## 2016-04-04 LAB — I-STAT CG4 LACTIC ACID, ED: LACTIC ACID, VENOUS: 2.78 mmol/L — AB (ref 0.5–2.0)

## 2016-04-04 LAB — TROPONIN I

## 2016-04-04 LAB — ETHANOL: Alcohol, Ethyl (B): <5 mg/dL (ref <5)

## 2016-04-04 LAB — RAPID URINE DRUG SCREEN, HOSP PERFORMED
AMPHETAMINES: NOT DETECTED
Barbiturates: NOT DETECTED
Benzodiazepines: NOT DETECTED
Cocaine: NOT DETECTED
OPIATES: NOT DETECTED
Tetrahydrocannabinol: NOT DETECTED

## 2016-04-04 MED ORDER — SODIUM CHLORIDE 0.9 % IV SOLN
INTRAVENOUS | Status: DC
  Administered 2016-04-04: 21:00:00 via INTRAVENOUS

## 2016-04-04 NOTE — ED Notes (Signed)
Notified EDP,Allen,MD., pt. I-stat CG4 Lactic acid 2.78 and RN, Charlann Boxer made aware.

## 2016-04-04 NOTE — ED Notes (Signed)
[facility] called to report that patient's device has not been checked since Nov. 2016 and that he is currently sinus rhythm, but there was a 15 minute period of rapid heart rate between 140-180 and questionable SVT. Technician will contact [facility]. The device is working fine according to [facility].
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
## 2016-04-04 NOTE — ED Notes (Signed)
Per pts wife, he was normal 15 min ago, while driving from [facility] pt became unresponsive, large amount of emesis. Pt does not recall event from getting in the car to waking up in the car in ED parking lot. Pt is not A & O

## 2016-04-04 NOTE — ED Notes (Signed)
Pacemaker interrogated, MD aware

## 2016-04-04 NOTE — ED Notes (Signed)
Patient transported to CT 

## 2016-04-05 ENCOUNTER — Inpatient Hospital Stay (HOSPITAL_COMMUNITY): Payer: Medicaid Other

## 2016-04-05 ENCOUNTER — Encounter (HOSPITAL_COMMUNITY): Payer: Self-pay | Admitting: Radiology

## 2016-04-05 DIAGNOSIS — R002 Palpitations: Secondary | ICD-10-CM | POA: Diagnosis present

## 2016-04-05 DIAGNOSIS — R55 Syncope and collapse: Secondary | ICD-10-CM | POA: Diagnosis present

## 2016-04-05 LAB — MAGNESIUM: Magnesium: 2 mg/dL (ref 1.7–2.4)

## 2016-04-05 LAB — COMPREHENSIVE METABOLIC PANEL
ALK PHOS: 80 U/L (ref 38–126)
ALT: 23 U/L (ref 17–63)
AST: 14 U/L — ABNORMAL LOW (ref 15–41)
Albumin: 3.7 g/dL (ref 3.5–5.0)
Anion gap: 8 (ref 5–15)
BUN: 24 mg/dL — ABNORMAL HIGH (ref 6–20)
CALCIUM: 9.1 mg/dL (ref 8.9–10.3)
CO2: 25 mmol/L (ref 22–32)
CREATININE: 0.91 mg/dL (ref 0.61–1.24)
Chloride: 107 mmol/L (ref 101–111)
Glucose, Bld: 125 mg/dL — ABNORMAL HIGH (ref 65–99)
Potassium: 4 mmol/L (ref 3.5–5.1)
Sodium: 140 mmol/L (ref 135–145)
Total Bilirubin: 0.3 mg/dL (ref 0.3–1.2)
Total Protein: 6.6 g/dL (ref 6.5–8.1)

## 2016-04-05 LAB — URINALYSIS, ROUTINE W REFLEX MICROSCOPIC
BILIRUBIN URINE: NEGATIVE
Glucose, UA: NEGATIVE mg/dL
Hgb urine dipstick: NEGATIVE
KETONES UR: NEGATIVE mg/dL
NITRITE: NEGATIVE
PH: 6 (ref 5.0–8.0)
Protein, ur: NEGATIVE mg/dL
Specific Gravity, Urine: 1.024 (ref 1.005–1.030)

## 2016-04-05 LAB — URINE MICROSCOPIC-ADD ON: BACTERIA UA: NONE SEEN

## 2016-04-05 LAB — ECHOCARDIOGRAM COMPLETE
HEIGHTINCHES: 73 in
Weight: 3265.6 oz

## 2016-04-05 LAB — LACTIC ACID, PLASMA: Lactic Acid, Venous: 1 mmol/L (ref 0.5–2.0)

## 2016-04-05 MED ORDER — DIFLUPREDNATE 0.05 % OP EMUL
1.0000 [drp] | Freq: Every day | OPHTHALMIC | Status: DC
  Filled 2016-04-05: qty 5

## 2016-04-05 MED ORDER — SODIUM CHLORIDE 0.9% FLUSH
3.0000 mL | Freq: Two times a day (BID) | INTRAVENOUS | Status: DC
  Administered 2016-04-06: 3 mL via INTRAVENOUS

## 2016-04-05 MED ORDER — ATORVASTATIN CALCIUM 20 MG PO TABS
20.0000 mg | ORAL_TABLET | Freq: Every day | ORAL | Status: DC
  Administered 2016-04-05 – 2016-04-06 (×2): 20 mg via ORAL
  Filled 2016-04-05 (×2): qty 1

## 2016-04-05 MED ORDER — SODIUM CHLORIDE 0.45 % IV SOLN
INTRAVENOUS | Status: DC
  Administered 2016-04-05 – 2016-04-06 (×2): via INTRAVENOUS

## 2016-04-05 MED ORDER — ONDANSETRON HCL 4 MG/2ML IJ SOLN
4.0000 mg | Freq: Four times a day (QID) | INTRAMUSCULAR | Status: DC | PRN
  Filled 2016-04-05: qty 2

## 2016-04-05 MED ORDER — ONDANSETRON HCL 4 MG/2ML IJ SOLN
4.0000 mg | Freq: Once | INTRAMUSCULAR | Status: AC
  Administered 2016-04-05: 4 mg via INTRAVENOUS
  Filled 2016-04-05: qty 2

## 2016-04-05 MED ORDER — ACETAMINOPHEN 325 MG PO TABS: 650.0000 mg | ORAL_TABLET | Freq: Four times a day (QID) | ORAL | Status: DC | PRN

## 2016-04-05 MED ORDER — IOPAMIDOL (ISOVUE-370) INJECTION 76%
100.0000 mL | Freq: Once | INTRAVENOUS | Status: AC | PRN
  Administered 2016-04-05: 100 mL via INTRAVENOUS

## 2016-04-05 MED ORDER — ONDANSETRON HCL 4 MG PO TABS: 4.0000 mg | ORAL_TABLET | Freq: Four times a day (QID) | ORAL | Status: DC | PRN

## 2016-04-05 MED ORDER — VITAMIN D3 25 MCG (1000 UNIT) PO TABS
2000.0000 [IU] | ORAL_TABLET | Freq: Every day | ORAL | Status: DC
  Administered 2016-04-05 – 2016-04-06 (×2): 2000 [IU] via ORAL
  Filled 2016-04-05 (×2): qty 2

## 2016-04-05 MED ORDER — BRIMONIDINE TARTRATE 0.2 % OP SOLN
1.0000 [drp] | Freq: Two times a day (BID) | OPHTHALMIC | Status: DC
  Administered 2016-04-05 – 2016-04-06 (×3): 1 [drp] via OPHTHALMIC
  Filled 2016-04-05: qty 5

## 2016-04-05 MED ORDER — TIMOLOL MALEATE 0.5 % OP SOLN
1.0000 [drp] | Freq: Two times a day (BID) | OPHTHALMIC | Status: DC
  Administered 2016-04-05 – 2016-04-06 (×3): 1 [drp] via OPHTHALMIC
  Filled 2016-04-05: qty 5

## 2016-04-05 MED ORDER — BRIMONIDINE TARTRATE-TIMOLOL 0.2-0.5 % OP SOLN
1.0000 [drp] | Freq: Two times a day (BID) | OPHTHALMIC | Status: DC
  Filled 2016-04-05: qty 5

## 2016-04-05 MED ORDER — ACETAMINOPHEN 650 MG RE SUPP: 650.0000 mg | Freq: Four times a day (QID) | RECTAL | Status: DC | PRN

## 2016-04-05 MED ORDER — ENOXAPARIN SODIUM 40 MG/0.4ML ~~LOC~~ SOLN
40.0000 mg | SUBCUTANEOUS | Status: DC
  Administered 2016-04-05 – 2016-04-06 (×2): 40 mg via SUBCUTANEOUS
  Filled 2016-04-05 (×2): qty 0.4

## 2016-04-05 MED ORDER — ASPIRIN 81 MG PO CHEW
81.0000 mg | CHEWABLE_TABLET | Freq: Every day | ORAL | Status: DC
  Administered 2016-04-05 – 2016-04-06 (×2): 81 mg via ORAL
  Filled 2016-04-05 (×2): qty 1

## 2016-04-05 MED ORDER — TAMSULOSIN HCL 0.4 MG PO CAPS
0.4000 mg | ORAL_CAPSULE | Freq: Every day | ORAL | Status: DC
  Administered 2016-04-05 – 2016-04-06 (×2): 0.4 mg via ORAL
  Filled 2016-04-05 (×2): qty 1

## 2016-04-05 MED ORDER — FLEET ENEMA 7-19 GM/118ML RE ENEM: 1.0000 | ENEMA | Freq: Once | RECTAL | Status: DC | PRN

## 2016-04-05 MED ORDER — TIOTROPIUM BROMIDE MONOHYDRATE 18 MCG IN CAPS
18.0000 ug | ORAL_CAPSULE | Freq: Every day | RESPIRATORY_TRACT | Status: DC
  Administered 2016-04-05 – 2016-04-06 (×2): 18 ug via RESPIRATORY_TRACT
  Filled 2016-04-05: qty 5

## 2016-04-05 MED ORDER — POLYETHYLENE GLYCOL 3350 17 G PO PACK: 17.0000 g | PACK | Freq: Every day | ORAL | Status: DC | PRN

## 2016-04-05 MED ORDER — AMLODIPINE BESYLATE 10 MG PO TABS
10.0000 mg | ORAL_TABLET | Freq: Every day | ORAL | Status: DC
  Administered 2016-04-05 – 2016-04-06 (×2): 10 mg via ORAL
  Filled 2016-04-05 (×2): qty 1

## 2016-04-05 MED ORDER — KETOROLAC TROMETHAMINE 0.5 % OP SOLN
1.0000 [drp] | Freq: Four times a day (QID) | OPHTHALMIC | Status: DC
  Administered 2016-04-05 – 2016-04-06 (×5): 1 [drp] via OPHTHALMIC
  Filled 2016-04-05: qty 3

## 2016-04-05 MED ORDER — SERTRALINE HCL 100 MG PO TABS
100.0000 mg | ORAL_TABLET | Freq: Every day | ORAL | Status: DC
  Administered 2016-04-05 – 2016-04-06 (×2): 100 mg via ORAL
  Filled 2016-04-05 (×2): qty 1

## 2016-04-05 NOTE — Progress Notes (Signed)
*  PRELIMINARY RESULTS* Echocardiogram 2D Echocardiogram has been performed.  Beryle Beams 04/05/2016, 1:10 PM
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
## 2016-04-05 NOTE — Progress Notes (Signed)
ICD [employer] on Demand ER transmission sent last evening reviewed by me and reveals normal device function. Battery voltage 3.02V, atrial and ventricular lead parameters unchanged. 70% A paced, <1% V paced No arrhythmias to correlate to symptoms   Normal pacemaker function No further inpatient EP workup planned  Thompson Grayer MD, [facility] 04/05/2016 2:36 PM

## 2016-04-05 NOTE — Progress Notes (Signed)
PROGRESS NOTE    Wesley [name]  [id number] DOB: [birth year] DOA: 04/04/2016 PCP: Minerva Ends, MD  Outpatient Specialists: Dr. Jannifer Franklin, neurology   Brief Narrative:  Admitted for syncopal episodes.  Workup thus far has been negative.   Assessment & Plan   Syncope -Unknown etiology, ?dehydration as patient had soft BP mild renal insufficiency  -CT head  -Pacemaker interrogated. Reviewed with Dr. Rayann Heman, cardiology. Pacemaker function was normal, no arrhythmias noted. No arrhythmias noted on telemetry. -Echocardiogram [id number], systolic function parameters were normal. -Currently patient has no neurological deficits. -BP was soft upon admission, while in the ER -Orthostatic vitals negative -Encouraged patient to follow up with Dr. Jannifer Franklin (neurology) and Dr. Sallyanne Kuster (cardiology) upon discharge -Spoke with Dr. Silverio Decamp, neurology, who recommended CTA head/neck and follow up with outpatient neurology.    Essential hypertension -Upon admission, BP soft -Lisinopril held, amlodipine continued  Hyperlipidemia -Continue statin  Depression -Continue zoloft  COPD -Stable, currently no wheezing -Continue Spiriva  Acute renal insuffiencey  -Resolved -Upon admission, Cr 1.26. Currently 0.91 -Lisinopril and Indocin were held.  BPH -Continue Flomax  DVT Prophylaxis  lovenox  Code Status: Full  Family Communication: Wife at bedside.  Disposition Plan: Admitted   Consultants Neurology via phone Cardiology via phone  Procedures  Echocardiogram  Antibiotics   Anti-infectives    None      Subjective:   Wesley [name] seen and examined today.  Patient has no complaints this morning. Denies dizziness, headache, chest pain, shortness of breath, abdominal pain.   Objective:   Filed Vitals:   04/05/16 0040 04/05/16 0529 04/05/16 0630 04/05/16 0913  BP: 107/57 98/54    Pulse:  60  60  Temp:  97.7 F (36.5 C)    TempSrc: Oral Oral    Resp: 16 18  18     Height:      Weight:   92.579 kg (204 lb 1.6 oz)   SpO2: 100% 96%  97%    Intake/Output Summary (Last 24 hours) at 04/05/16 1641 Last data filed at 04/05/16 0548  Gross per 24 hour  Intake    525 ml  Output      0 ml  Net    525 ml   Filed Weights   04/04/16 2052 04/05/16 0031 04/05/16 0630  Weight: 91.627 kg (202 lb) 91.9 kg (202 lb 9.6 oz) 92.579 kg (204 lb 1.6 oz)    Exam  General: Well developed, well nourished, NAD, appears stated age  HEENT: NCAT, mucous membranes moist.  Neck: Supple, no JVD, no masses  Cardiovascular: S1 S2 auscultated, no rubs, murmurs or gallops. Regular rate and rhythm.  Respiratory: Clear to auscultation bilaterally  Abdomen: Soft, nontender, nondistended, + bowel sounds  Extremities: warm dry without cyanosis clubbing or edema  Neuro: AAOx3, nonfocal  Skin: Without rashes exudates or nodules  Psych: Normal affect and demeanor  Data Reviewed: I have personally reviewed following labs and imaging studies  CBC:  Recent Labs Lab 04/04/16 2113  WBC 6.9  NEUTROABS 3.4  HGB 14.5  HCT 44.7  MCV 83.4  PLT 123XX123   Basic Metabolic Panel:  Recent Labs Lab 04/04/16 2113 04/05/16 0516  NA 139 140  K 3.7 4.0  CL 105 107  CO2 21* 25  GLUCOSE 123* 125*  BUN 25* 24*  CREATININE 1.26* 0.91  CALCIUM 9.6 9.1  MG  --  2.0   GFR: Estimated Creatinine Clearance: 98.8 mL/min (by C-G formula based on Cr of 0.91). Liver Function Tests:  Recent Labs Lab 04/04/16 2113 04/05/16 0516  AST 16 14*  ALT 27 23  ALKPHOS 93 80  BILITOT 0.6 0.3  PROT 7.9 6.6  ALBUMIN 4.7 3.7   No results for input(s): LIPASE, AMYLASE in the last 168 hours. No results for input(s): AMMONIA in the last 168 hours. Coagulation Profile: No results for input(s): INR, PROTIME in the last 168 hours. Cardiac Enzymes:  Recent Labs Lab 04/04/16 2113  TROPONINI <0.03   BNP (last 3 results) No results for input(s): PROBNP in the last 8760 hours. HbA1C: No  results for input(s): HGBA1C in the last 72 hours. CBG: No results for input(s): GLUCAP in the last 168 hours. Lipid Profile: No results for input(s): CHOL, HDL, LDLCALC, TRIG, CHOLHDL, LDLDIRECT in the last 72 hours. Thyroid Function Tests: No results for input(s): TSH, T4TOTAL, FREET4, T3FREE, THYROIDAB in the last 72 hours. Anemia Panel: No results for input(s): VITAMINB12, FOLATE, FERRITIN, TIBC, IRON, RETICCTPCT in the last 72 hours. Urine analysis:    Component Value Date/Time   COLORURINE YELLOW 04/05/2016 1026   APPEARANCEUR CLOUDY* 04/05/2016 1026   LABSPEC 1.024 04/05/2016 1026   PHURINE 6.0 04/05/2016 1026   GLUCOSEU NEGATIVE 04/05/2016 1026   HGBUR NEGATIVE 04/05/2016 1026   BILIRUBINUR NEGATIVE 04/05/2016 1026   KETONESUR NEGATIVE 04/05/2016 1026   PROTEINUR NEGATIVE 04/05/2016 1026   NITRITE NEGATIVE 04/05/2016 1026   LEUKOCYTESUR SMALL* 04/05/2016 1026   Sepsis Labs: @LABRCNTIP (procalcitonin:4,lacticidven:4)  )No results found for this or any previous visit (from the past 240 hour(s)).    Radiology Studies: Dg Abd 1 View  04/05/2016  CLINICAL DATA:  Difficulty voiding, acute renal insufficiency EXAM: ABDOMEN - 1 VIEW COMPARISON:  None. FINDINGS: The bowel gas pattern is normal. No radio-opaque calculi or other significant radiographic abnormality are seen. Mild degenerative changes are seen in the lower lumbar spine. IMPRESSION: Negative. Electronically Signed   By: Nolon Nations M.D.   On: 04/05/2016 10:46   Ct Head Wo Contrast  04/04/2016  CLINICAL DATA:  Initial evaluation for acute seizure. EXAM: CT HEAD WITHOUT CONTRAST TECHNIQUE: Contiguous axial images were obtained from the base of the skull through the vertex without intravenous contrast. COMPARISON:  None. FINDINGS: There is no acute intracranial hemorrhage or infarct. No mass lesion or midline shift. Gray-white matter differentiation is well maintained. Ventricles are normal in size without evidence of  hydrocephalus. CSF containing spaces are within normal limits. No extra-axial fluid collection. Atheromatous plaque within the carotid siphons. Mild atrophy with chronic small vessel ischemic disease. Somewhat age indeterminate hypodensity within the left basal ganglia. The calvarium is intact. Orbital soft tissues are within normal limits. The paranasal sinuses and mastoid air cells are well pneumatized and free of fluid. Scalp soft tissues are unremarkable. IMPRESSION: 1. Age indeterminate hypodensity within the left basal ganglia. While this finding may be chronic in nature, possible acute or subacute ischemia could be considered in the correct clinical setting. 2. No other acute intracranial process. 3. Mild atrophy with chronic small vessel ischemic disease and intracranial atherosclerosis. Electronically Signed   By: Jeannine Boga M.D.   On: 04/04/2016 21:36   Dg Chest Port 1 View  04/04/2016  CLINICAL DATA:  Per pts wife, he was normal 15 min ago, while driving from [facility] pt became unresponsive, large amount of emesis. Pt does not recall event from getting in the car to waking up in the car in ED parking lot. EXAM: PORTABLE CHEST 1 VIEW COMPARISON:  Radiograph 10/01/2015. FINDINGS: LEFT-sided  pacemaker overlies normal cardiac silhouette. Bullous change in the RIGHT upper lobe. There is fine interstitial edema pattern within the RIGHT lung. No focal consolidation. IMPRESSION: Interstitial edema pattern RIGHT upper lobe bullous change. Electronically Signed   By: Suzy Bouchard M.D.   On: 04/04/2016 21:29     Scheduled Meds: . amLODipine  10 mg Oral Daily  . aspirin  81 mg Oral Daily  . atorvastatin  20 mg Oral Daily  . brimonidine  1 drop Both Eyes BID   And  . timolol  1 drop Both Eyes BID  . cholecalciferol  2,000 Units Oral Daily  . Difluprednate  1 drop Both Eyes Daily  . enoxaparin (LOVENOX) injection  40 mg Subcutaneous Q24H  . ketorolac  1 drop Left Eye QID    . sertraline  100 mg Oral Daily  . sodium chloride flush  3 mL Intravenous Q12H  . tamsulosin  0.4 mg Oral Daily  . tiotropium  18 mcg Inhalation Daily   Continuous Infusions: . sodium chloride 75 mL/hr at 04/05/16 0200  . sodium chloride Stopped (04/04/16 2353)     LOS: 1 day   Time Spent in minutes   30 minutes  Meerab Maselli D.O. on 04/05/2016 at 4:41 PM  Between 7am to 7pm - Pager - [phone]  After 7pm go to [email or web address] - password [email or web address]  And look for the night coverage person covering for me after hours  [facility] Office  [phone]
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
## 2016-04-06 ENCOUNTER — Encounter (HOSPITAL_COMMUNITY): Payer: Self-pay | Admitting: Emergency Medicine

## 2016-04-06 ENCOUNTER — Emergency Department (HOSPITAL_COMMUNITY)
Admission: EM | Admit: 2016-04-06 | Discharge: 2016-04-06 | Disposition: A | Discharge status: Home / Self Care | Payer: No Typology Code available for payment source | Attending: Emergency Medicine | Admitting: Emergency Medicine

## 2016-04-06 ENCOUNTER — Emergency Department (HOSPITAL_COMMUNITY): Payer: No Typology Code available for payment source

## 2016-04-06 ENCOUNTER — Encounter: Payer: Self-pay | Admitting: Vascular Surgery

## 2016-04-06 DIAGNOSIS — Z7982 Long term (current) use of aspirin: Secondary | ICD-10-CM | POA: Insufficient documentation

## 2016-04-06 DIAGNOSIS — S63502A Unspecified sprain of left wrist, initial encounter: Secondary | ICD-10-CM | POA: Insufficient documentation

## 2016-04-06 DIAGNOSIS — Y92410 Unspecified street and highway as the place of occurrence of the external cause: Secondary | ICD-10-CM | POA: Insufficient documentation

## 2016-04-06 DIAGNOSIS — G4700 Insomnia, unspecified: Secondary | ICD-10-CM | POA: Insufficient documentation

## 2016-04-06 DIAGNOSIS — Z8611 Personal history of tuberculosis: Secondary | ICD-10-CM | POA: Insufficient documentation

## 2016-04-06 DIAGNOSIS — J449 Chronic obstructive pulmonary disease, unspecified: Secondary | ICD-10-CM | POA: Insufficient documentation

## 2016-04-06 DIAGNOSIS — R002 Palpitations: Secondary | ICD-10-CM

## 2016-04-06 DIAGNOSIS — S8002XA Contusion of left knee, initial encounter: Secondary | ICD-10-CM | POA: Diagnosis not present

## 2016-04-06 DIAGNOSIS — Z9889 Other specified postprocedural states: Secondary | ICD-10-CM | POA: Insufficient documentation

## 2016-04-06 DIAGNOSIS — I252 Old myocardial infarction: Secondary | ICD-10-CM | POA: Insufficient documentation

## 2016-04-06 DIAGNOSIS — I10 Essential (primary) hypertension: Secondary | ICD-10-CM

## 2016-04-06 DIAGNOSIS — Z950 Presence of cardiac pacemaker: Secondary | ICD-10-CM

## 2016-04-06 DIAGNOSIS — M1990 Unspecified osteoarthritis, unspecified site: Secondary | ICD-10-CM | POA: Insufficient documentation

## 2016-04-06 DIAGNOSIS — Y998 Other external cause status: Secondary | ICD-10-CM | POA: Insufficient documentation

## 2016-04-06 DIAGNOSIS — S8000XA Contusion of unspecified knee, initial encounter: Secondary | ICD-10-CM

## 2016-04-06 DIAGNOSIS — E785 Hyperlipidemia, unspecified: Secondary | ICD-10-CM | POA: Insufficient documentation

## 2016-04-06 DIAGNOSIS — Z880 Allergy status to penicillin: Secondary | ICD-10-CM | POA: Insufficient documentation

## 2016-04-06 DIAGNOSIS — F17210 Nicotine dependence, cigarettes, uncomplicated: Secondary | ICD-10-CM | POA: Insufficient documentation

## 2016-04-06 DIAGNOSIS — Z79899 Other long term (current) drug therapy: Secondary | ICD-10-CM | POA: Insufficient documentation

## 2016-04-06 DIAGNOSIS — Y9389 Activity, other specified: Secondary | ICD-10-CM | POA: Insufficient documentation

## 2016-04-06 DIAGNOSIS — I959 Hypotension, unspecified: Secondary | ICD-10-CM

## 2016-04-06 MED ORDER — ACETAMINOPHEN 500 MG PO TABS
1000.0000 mg | ORAL_TABLET | Freq: Once | ORAL | Status: AC
  Administered 2016-04-06: 1000 mg via ORAL
  Filled 2016-04-06: qty 2

## 2016-04-06 MED ORDER — GI COCKTAIL ~~LOC~~
30.0000 mL | Freq: Once | ORAL | Status: AC
  Administered 2016-04-06: 30 mL via ORAL
  Filled 2016-04-06 (×2): qty 30

## 2016-04-06 MED ORDER — PANTOPRAZOLE SODIUM 40 MG PO TBEC: 40.0000 mg | DELAYED_RELEASE_TABLET | Freq: Every day | ORAL | Status: DC

## 2016-04-06 NOTE — ED Provider Notes (Signed)
  X-rays visualized by me, no fracture noted. We'll have patient followup with PCP in one week if still in pain for possible repeat x-rays as a small fracture may be missed. We'll have patient rest, ice, ibuprofen, elevation. Patient can bear weight as tolerated.  Discussed signs that warrant reevaluation.     Louanne Skye, MD 04/06/16 [phone]
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
## 2016-04-06 NOTE — Discharge Instructions (Signed)
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
## 2016-04-06 NOTE — Progress Notes (Unsigned)
Patient ID: Wesley [name], male   DOB: [birth year], 60 y.o.   MRN: [id number]   Asked to review pt CT for syncope workup.  Pt has no significant great vessel or proximal vertebral disease.  Carotid bifurcations widely patent.  Carotid siphon calcification but difficult to know how much narrowing due to bone and calcium minimizing views.  The carotid siphon is not accessible from a vascular surgical standpoint.  No significant extracranial occlusive disease reachable from a vascular surgical standpoint.  Ruta Hinds, MD [facility] Office: [phone] Pager: [phone]
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
## 2016-04-07 ENCOUNTER — Telehealth: Payer: Self-pay | Admitting: Neurology

## 2016-04-07 ENCOUNTER — Telehealth: Payer: Self-pay | Admitting: Family Medicine

## 2016-04-07 DIAGNOSIS — M25532 Pain in left wrist: Principal | ICD-10-CM

## 2016-04-07 DIAGNOSIS — M25531 Pain in right wrist: Secondary | ICD-10-CM

## 2016-04-07 DIAGNOSIS — M2550 Pain in unspecified joint: Secondary | ICD-10-CM

## 2016-04-07 LAB — HEMOGLOBIN A1C
HEMOGLOBIN A1C: 6.6 % — AB (ref 4.8–5.6)
Mean Plasma Glucose: 143 mg/dL

## 2016-04-07 MED FILL — PANTOPRAZOLE SOD DR 40 MG T: 40 | 30 days supply | Qty: 30 | Fill #0

## 2016-04-07 MED FILL — SPIRIVA 18 MCG CP-HANDIHALE: 18 | 30 days supply | Qty: 30 | Fill #0

## 2016-04-07 NOTE — Telephone Encounter (Signed)
   The patient was seen in our office on 03/31/2016, apparently had a syncopal event and was admitted to the hospital shortly thereafter, underwent a CT of the head, CT angiogram of the head and neck. No indication for repeat CT of the head.   Ct head 04/04/16:  IMPRESSION: 1. Age indeterminate hypodensity within the left basal ganglia. While this finding may be chronic in nature, possible acute or subacute ischemia could be considered in the correct clinical setting. 2. No other acute intracranial process. 3. Mild atrophy with chronic small vessel ischemic disease and intracranial atherosclerosis.   CTA of the head and neck 04/06/16:  IMPRESSION: 1. Negative for emergent large vessel occlusion. No arterial stenosis in the neck. 2. Fairly extensive bilateral ICA siphon calcified plaque resulting in up to moderate bilateral siphon stenosis. Otherwise negative anterior circulation. 3. Negative posterior circulation. 4. Stable CT appearance of the brain since yesterday. Age indeterminate but probably chronic small vessel white matter ischemic disease in the left hemisphere. 5. Small volume retained secretions in the larynx might be related to aspiration. Severe right upper lobe bullous emphysema. 6. Poor posterior dentition.

## 2016-04-07 NOTE — Telephone Encounter (Signed)
Patient called, was advised at time of discharge to schedule (1) week follow-up with Dr. Jannifer Franklin. Please call patient to schedule.

## 2016-04-07 NOTE — Telephone Encounter (Signed)
Pt. Came into facility requesting a referral to see an orthopedic for his knees and wrist. Pt. would also like to know if he still needs to take his lisinopril. Please f/u with pt.

## 2016-04-07 NOTE — Telephone Encounter (Signed)
Arena with [facility]  called this morning about orders for CT. She called to pt to schedule and the pt said he had it done at [facility] on April 21st. Do you still need the pt to proceed with another CT? Please call [phone]. Thank you

## 2016-04-07 NOTE — Telephone Encounter (Signed)
Pt had head CT during recent hospital visit which showed: age-indeterminate hypodensity of the left basal ganglia. No other acute intercranial process.

## 2016-04-08 NOTE — Telephone Encounter (Signed)
Returned pt TC and spoke w/ his wife. Appt scheduled for next Fri @ 8:30. Mrs. Bradway agreed to check w/ her daughter re: driving pt to appt for 8:15 arrival time. Will call back if needs to reschedule.

## 2016-04-08 NOTE — Telephone Encounter (Signed)
STOP lisinopril per discharge instruction from the hospital due to Cr elevation Ortho referral placed

## 2016-04-08 NOTE — Telephone Encounter (Signed)
Pt called in due to missed call. I told the pt the nurse called and spoke with his wife about his appt time and date. He expressed understanding. No call back required.

## 2016-04-08 NOTE — Telephone Encounter (Signed)
Returned TC to [facility] w/ [employer]. Left mssg to cancel CT scan as pt already had on 04/04/16 @ [facility].

## 2016-04-09 NOTE — Telephone Encounter (Signed)
Pt return call  Advised to Stop lisinopril due to CR elevation  Ortho referral placed, if no call from ortho in two weeks please call our office  Pt verbalized understanding

## 2016-04-09 NOTE — Telephone Encounter (Signed)
LVM to return call.

## 2016-04-10 ENCOUNTER — Ambulatory Visit (INDEPENDENT_AMBULATORY_CARE_PROVIDER_SITE_OTHER): Payer: Medicaid Other | Admitting: Cardiovascular Disease

## 2016-04-10 ENCOUNTER — Encounter: Payer: Self-pay | Admitting: Cardiovascular Disease

## 2016-04-10 VITALS — BP 116/80 | HR 60 | Ht 73.5 in | Wt 197.2 lb

## 2016-04-10 DIAGNOSIS — F17200 Nicotine dependence, unspecified, uncomplicated: Secondary | ICD-10-CM

## 2016-04-10 DIAGNOSIS — R55 Syncope and collapse: Secondary | ICD-10-CM

## 2016-04-10 DIAGNOSIS — R062 Wheezing: Secondary | ICD-10-CM | POA: Diagnosis not present

## 2016-04-10 DIAGNOSIS — Z950 Presence of cardiac pacemaker: Secondary | ICD-10-CM

## 2016-04-10 DIAGNOSIS — I10 Essential (primary) hypertension: Secondary | ICD-10-CM | POA: Diagnosis not present

## 2016-04-10 DIAGNOSIS — E785 Hyperlipidemia, unspecified: Secondary | ICD-10-CM

## 2016-04-10 DIAGNOSIS — R0602 Shortness of breath: Secondary | ICD-10-CM

## 2016-04-10 DIAGNOSIS — I495 Sick sinus syndrome: Secondary | ICD-10-CM | POA: Diagnosis not present

## 2016-04-10 MED ORDER — METOPROLOL TARTRATE 25 MG PO TABS: 25.0000 mg | ORAL_TABLET | Freq: Two times a day (BID) | ORAL | Status: DC

## 2016-04-10 NOTE — Patient Instructions (Signed)
Medication Instructions:   STOP amlodipine START metoprolol tartrate 25mg  twice daily  Labwork:  NONE  Testing/Procedures:  Your physician has recommended that you have a pulmonary function test. Pulmonary Function Tests are a group of tests that measure how well air moves in and out of your lungs. >> this is done at [facility]:  Your physician recommends that you schedule a follow-up appointment in: [place] with Dr. Sallyanne Kuster   Any Other Special Instructions Will Be Listed Below (If Applicable).
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
## 2016-04-14 ENCOUNTER — Telehealth: Payer: Self-pay | Admitting: Cardiovascular Disease

## 2016-04-14 NOTE — Telephone Encounter (Signed)
Received records from [facility] - Dr Francena Hanly- as requested by Dr Sallyanne Kuster for appointment on 05/21/16 with Dr Sallyanne Kuster.  Records given to [facility] (medical records) for Dr Croitoru's schedule on 05/21/16. lp

## 2016-04-14 NOTE — Telephone Encounter (Signed)
[facility] Dr Croitoru's request.  Faxed on 04/14/16 to [phone]. lp

## 2016-04-15 ENCOUNTER — Ambulatory Visit: Payer: Medicaid Other | Attending: Family Medicine | Admitting: Family Medicine

## 2016-04-15 ENCOUNTER — Encounter: Payer: Self-pay | Admitting: Family Medicine

## 2016-04-15 VITALS — BP 152/88 | HR 60 | Temp 97.7°F | Resp 16 | Ht 73.5 in | Wt 200.0 lb

## 2016-04-15 DIAGNOSIS — K228 Other specified diseases of esophagus: Secondary | ICD-10-CM

## 2016-04-15 DIAGNOSIS — F17210 Nicotine dependence, cigarettes, uncomplicated: Secondary | ICD-10-CM | POA: Insufficient documentation

## 2016-04-15 DIAGNOSIS — M25531 Pain in right wrist: Secondary | ICD-10-CM

## 2016-04-15 DIAGNOSIS — Z79899 Other long term (current) drug therapy: Secondary | ICD-10-CM | POA: Diagnosis not present

## 2016-04-15 DIAGNOSIS — K2289 Other specified disease of esophagus: Secondary | ICD-10-CM

## 2016-04-15 DIAGNOSIS — H9190 Unspecified hearing loss, unspecified ear: Secondary | ICD-10-CM | POA: Insufficient documentation

## 2016-04-15 DIAGNOSIS — M25532 Pain in left wrist: Secondary | ICD-10-CM | POA: Insufficient documentation

## 2016-04-15 DIAGNOSIS — H9193 Unspecified hearing loss, bilateral: Secondary | ICD-10-CM | POA: Diagnosis not present

## 2016-04-15 DIAGNOSIS — I10 Essential (primary) hypertension: Secondary | ICD-10-CM

## 2016-04-15 MED ORDER — SUCRALFATE 1 GM/10ML PO SUSP: 1.0000 g | Freq: Three times a day (TID) | ORAL | Status: DC

## 2016-04-15 MED ORDER — WRIST SPLINT/ELASTIC LEFT LG MISC: 1.0000 | Freq: Every day | Status: DC

## 2016-04-15 MED ORDER — METOPROLOL TARTRATE 25 MG PO TABS: 25.0000 mg | ORAL_TABLET | Freq: Two times a day (BID) | ORAL | Status: DC

## 2016-04-15 MED FILL — CARAFATE 1 GM/10 ML SUSP: 1 | 11 days supply | Qty: 420 | Fill #0

## 2016-04-15 MED FILL — METOPROLOL TARTRATE 25 MG T: 25 | 30 days supply | Qty: 60 | Fill #0
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
## 2016-04-15 NOTE — Progress Notes (Signed)
HFU syncope  Pt stated syncope x 3 time prior to hospital visit  No Hx head injury  No pain today  Tobacco user, stated smoke 1-2 cigarette  this past week  No suicidal thoughts in the past two weeks

## 2016-04-15 NOTE — Patient Instructions (Addendum)
[place] was seen today for hospitalization follow-up.  Diagnoses and all orders for this visit:  Essential hypertension -     metoprolol tartrate (LOPRESSOR) 25 MG tablet; Take 1 tablet (25 mg total) by mouth 2 (two) times daily.  Pain in both wrists -     Elastic Bandages & Supports (WRIST SPLINT/ELASTIC LEFT LG) MISC; 1 each by Does not apply route daily.  Esophageal pain -     sucralfate (CARAFATE) 1 GM/10ML suspension; Take 10 mLs (1 g total) by mouth 4 (four) times daily -  with meals and at bedtime.  Hearing loss, bilateral -     Ambulatory referral to Audiology   Please start metoprolol   F/u in 4 weeks for HTN  Dr. Adrian Blackwater

## 2016-04-15 NOTE — Assessment & Plan Note (Signed)
Uncontrolled. Med: non compliant with metoprolol P: Refilled metoprolol to onsite pharmacy Continue lisinopril 40 mg daily

## 2016-04-15 NOTE — Assessment & Plan Note (Addendum)
Esophageal pain following TEE

## 2016-04-15 NOTE — Assessment & Plan Note (Signed)
Hearing loss both ears Normal exam Audiology referral for assessment

## 2016-04-17 ENCOUNTER — Ambulatory Visit (HOSPITAL_COMMUNITY)
Admission: RE | Admit: 2016-04-17 | Discharge: 2016-04-17 | Disposition: A | Discharge status: Home / Self Care | Payer: Medicaid Other | Source: Ambulatory Visit | Attending: Cardiovascular Disease | Admitting: Cardiovascular Disease

## 2016-04-17 DIAGNOSIS — R0602 Shortness of breath: Secondary | ICD-10-CM | POA: Diagnosis not present

## 2016-04-17 DIAGNOSIS — R062 Wheezing: Secondary | ICD-10-CM | POA: Insufficient documentation

## 2016-04-17 LAB — PULMONARY FUNCTION TEST
DL/VA % PRED: 64 %
DL/VA: 3.1 ml/min/mmHg/L
DLCO UNC % PRED: 46 %
DLCO unc: 17.65 ml/min/mmHg
FEF 25-75 POST: 1.2 L/s
FEF 25-75 PRE: 0.67 L/s
FEF2575-%CHANGE-POST: 80 %
FEF2575-%PRED-POST: 36 %
FEF2575-%PRED-PRE: 20 %
FEV1-%Change-Post: 18 %
FEV1-%PRED-POST: 61 %
FEV1-%Pred-Pre: 52 %
FEV1-Post: 2.24 L
FEV1-Pre: 1.89 L
FEV1FVC-%Change-Post: 7 %
FEV1FVC-%PRED-PRE: 70 %
FEV6-%CHANGE-POST: 14 %
FEV6-%Pred-Post: 78 %
FEV6-%Pred-Pre: 68 %
FEV6-Post: 3.52 L
FEV6-Pre: 3.08 L
FEV6FVC-%Change-Post: 4 %
FEV6FVC-%Pred-Post: 95 %
FEV6FVC-%Pred-Pre: 91 %
FVC-%CHANGE-POST: 9 %
FVC-%PRED-POST: 81 %
FVC-%PRED-PRE: 74 %
FVC-PRE: 3.45 L
FVC-Post: 3.79 L
POST FEV1/FVC RATIO: 59 %
PRE FEV1/FVC RATIO: 55 %
Post FEV6/FVC ratio: 93 %
Pre FEV6/FVC Ratio: 89 %
RV % pred: 124 %
RV: 3.05 L
TLC % pred: 86 %
TLC: 6.73 L

## 2016-04-17 MED ORDER — ALBUTEROL SULFATE (2.5 MG/3ML) 0.083% IN NEBU
2.5000 mg | INHALATION_SOLUTION | Freq: Once | RESPIRATORY_TRACT | Status: AC
  Administered 2016-04-17: 2.5 mg via RESPIRATORY_TRACT

## 2016-04-18 ENCOUNTER — Encounter: Payer: Self-pay | Admitting: Family Medicine

## 2016-04-18 ENCOUNTER — Encounter: Payer: Self-pay | Admitting: Neurology

## 2016-04-18 ENCOUNTER — Ambulatory Visit
Admission: RE | Admit: 2016-04-18 | Discharge: 2016-04-18 | Disposition: A | Discharge status: Home / Self Care | Payer: Medicaid Other | Source: Ambulatory Visit | Attending: Family Medicine | Admitting: Family Medicine

## 2016-04-18 ENCOUNTER — Ambulatory Visit (INDEPENDENT_AMBULATORY_CARE_PROVIDER_SITE_OTHER): Payer: Medicaid Other | Admitting: Family Medicine

## 2016-04-18 ENCOUNTER — Ambulatory Visit (INDEPENDENT_AMBULATORY_CARE_PROVIDER_SITE_OTHER): Payer: Medicaid Other | Admitting: Neurology

## 2016-04-18 VITALS — BP 130/78 | Ht 74.0 in | Wt 201.0 lb

## 2016-04-18 VITALS — BP 126/78 | HR 78

## 2016-04-18 DIAGNOSIS — G250 Essential tremor: Secondary | ICD-10-CM

## 2016-04-18 DIAGNOSIS — M545 Low back pain, unspecified: Secondary | ICD-10-CM

## 2016-04-18 DIAGNOSIS — R413 Other amnesia: Secondary | ICD-10-CM | POA: Diagnosis not present

## 2016-04-18 DIAGNOSIS — R55 Syncope and collapse: Secondary | ICD-10-CM | POA: Diagnosis not present

## 2016-04-18 DIAGNOSIS — M25531 Pain in right wrist: Secondary | ICD-10-CM

## 2016-04-18 DIAGNOSIS — M5459 Other low back pain: Secondary | ICD-10-CM | POA: Insufficient documentation

## 2016-04-18 DIAGNOSIS — M25532 Pain in left wrist: Secondary | ICD-10-CM

## 2016-04-18 DIAGNOSIS — R202 Paresthesia of skin: Secondary | ICD-10-CM | POA: Diagnosis not present

## 2016-04-18 MED ORDER — PREDNISONE 10 MG PO TABS: ORAL_TABLET | ORAL | Status: DC

## 2016-04-18 MED FILL — predniSONE 10 MG TABS: 10 | 10 days supply | Qty: 30 | Fill #0

## 2016-04-18 NOTE — Progress Notes (Signed)
  Wesley [name] - 60 y.o. male MRN [id number]  Date of birth: 1956/11/14 Wesley [name] is a 60 y.o. male who presents today for back pain and wrist pain.  Wrist pain, initial visit 04/18/16-patient presents today with several months/over a year of ongoing right greater than left wrist pain. Pain localized in the ulnar aspect at the carpal region. Denies any swelling today but states that he has had swelling previously. There is no erythema and denies fevers chills night sweats. Has not tried anything to date other than wrapping with an Ace wrap that helps minimally. He is right hand dominant has never injured either wrist before. No paresthesias going distally into his hand.  Back pain, initial visit 04/18/16-patient presents today for lumbar back pain as well. This is been ongoing now for several years and has never had this worked up. Denies any paresthesias going into his lower extremities and denies any weakness with walking.  Pt denies any current bowel/bladder problems, fever, chills, unintentional weight loss, night time awakenings secondary to pain, weakness in one or both legs.  He has not had treatment to date for this and no previous workup as well.  PMHx - Updated and reviewed.  Contributory factors include: HTN, COPD, BPH, Tobacco use  PSHx - Updated and reviewed.  Contributory factors include:  Pacemaker  FHx - Updated and reviewed.  Contributory factors include:  [place] Lung sister  Social Hx - Updated and reviewed. Contributory factors include: current smoker  Medications - Updated/reviewed    ROS Per HPI.  12 point negative other than per HPI.   Exam:  Filed Vitals:   04/18/16 1135  BP: 130/78   Gen: NAD, AAO 3 Cardio- RRR Pulm - Normal respiratory effort/rate Skin: No rashes or erythema Extremities: No edema  Vascular: pulses +2 bilateral upper and lower extremity Psych: Normal affect  Wrist Exam B/L: Wrist: Inspection normal with no visible erythema or swelling. ROM  smooth and normal with good flexion and extension and ulnar/radial deviation that is symmetrical with opposite wrist. Palpation is normal over metacarpals, navicular, lunate, and TFCC; tendons without tenderness/ swelling Strength 5/5 in all directions without pain. Negative Finkelstein, tinel's and phalens. Neuro: CN 2-12 intact, MS 5/5 B/L UE and LE Back Exam: 1.Gait  1. Walk on heels (L5 root)  Normal   Walk on toes (S1 root)  Normal  2. TTP along Lumbar Vertebrae - Negative  3. Pain with :   1) Extension -Negative   2) Flexion - Positive, paraspinal  5. Straight Leg Raise - Negative  1) Radiation into opposite leg - Negative   2) Worse with Dorsiflexion of ankle - Negative  6. Sitting Leg Raise - Negative  7. DTR - +2/4 Patellar/Achilles 8. MS - 5/5 L2-S1 myotome strength B/L  9. 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

## 2016-04-18 NOTE — Assessment & Plan Note (Signed)
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 There is evidence of chondrocalcinosis in the fibrocartilage meniscus of the knee as well and previous x-rays. -We'll obtain right film x-ray. -Discussed the definitive diagnosis would include a joint aspiration under ultrasound guidance when he is having an acute flare effusion. -We will have a trial of 10 days of Sterapred tapering to see if this alleviates his symptoms. If he just has degenerative TFCC changes with chondrocalcinosis there , I would not expect his symptoms to greatly improve on the steroids alone.
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
## 2016-04-18 NOTE — Assessment & Plan Note (Signed)
Again may be secondary to pseudogout versus generalized spondylosis of the lumbar spine. No red flags on history or exam today -Two-view lumbar spine and steroid taper. -Follow-up in 2-3 weeks to go over  x-rays and consider further imaging versus physical therapy.

## 2016-04-22 ENCOUNTER — Telehealth: Payer: Self-pay

## 2016-04-22 MED ORDER — ALBUTEROL SULFATE HFA 108 (90 BASE) MCG/ACT IN AERS: 2.0000 | INHALATION_SPRAY | Freq: Four times a day (QID) | RESPIRATORY_TRACT | Status: DC | PRN

## 2016-04-22 MED FILL — PROAIR HFA 90 MCG INHALER: 108 (90 BAS | 30 days supply | Qty: 9 | Fill #0

## 2016-04-22 NOTE — Telephone Encounter (Signed)
-----   Message from Sanda Klein, MD sent at 04/19/2016  9:56 AM EDT ----- PFTs confirm COPD, but did show a reasonably good response to inhaler. Please give him a Rx for albuterol inhaler 2 puffs every 4-6h as needed for wheezing and shortness of breath

## 2016-04-22 NOTE — Telephone Encounter (Signed)
Rx sent to pharmacy electronically.   

## 2016-05-01 ENCOUNTER — Ambulatory Visit (INDEPENDENT_AMBULATORY_CARE_PROVIDER_SITE_OTHER): Payer: Medicaid Other | Admitting: Family Medicine

## 2016-05-01 ENCOUNTER — Encounter: Payer: Self-pay | Admitting: Family Medicine

## 2016-05-01 VITALS — BP 137/92 | Ht 74.0 in | Wt 202.0 lb

## 2016-05-01 DIAGNOSIS — M25532 Pain in left wrist: Secondary | ICD-10-CM | POA: Diagnosis not present

## 2016-05-01 DIAGNOSIS — M25531 Pain in right wrist: Secondary | ICD-10-CM | POA: Diagnosis present

## 2016-05-01 MED ORDER — MELOXICAM 15 MG PO TABS: 15.0000 mg | ORAL_TABLET | Freq: Every day | ORAL | Status: DC

## 2016-05-01 MED FILL — MELOXICAM 15 MG TABLET: 15 | 30 days supply | Qty: 30 | Fill #0

## 2016-05-01 NOTE — Assessment & Plan Note (Signed)
After review of his B/L wrist films with evidence of chondrocalcinosis of the L TFCC fibrocartlidge and his B/L radioscaphoid OA, I suspect he has pseudogout with advancing OA of these joints.   - Start mobic daily.  Discussed possible injections which we will hold off on today.  - F/U in 4 weeks.  If conservative measures fail, referral to hand surgeon would be appropriate.

## 2016-05-01 NOTE — Progress Notes (Signed)
  Wesley [name] - 60 y.o. male MRN [id number]  Date of birth: May 12, 1956 Wesley [name] is a 60 y.o. male who presents today for back pain and wrist pain.  Wrist pain, initial visit 04/18/16-patient presents today with several months/over a year of ongoing right greater than left wrist pain. Pain localized in the ulnar aspect at the carpal region. Denies any swelling today but states that he has had swelling previously. There is no erythema and denies fevers chills night sweats. Has not tried anything to date other than wrapping with an Ace wrap that helps minimally. He is right hand dominant has never injured either wrist before. No paresthesias going distally into his hand.  Wrist Pain B/L 05/01/16 - Pt returns today for ongoing B/L wrist pain.  R>L, and last visit was placed on steroids that greatly helped his pain.  Also using wrist splint on the R which has helped.  No paresthesias.  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    Back pain, initial visit 04/18/16-patient presents today for lumbar back pain as well. This is been ongoing now for several years and has never had this worked up. Denies any paresthesias going into his lower extremities and denies any weakness with walking.  Pt denies any current bowel/bladder problems, fever, chills, unintentional weight loss, night time awakenings secondary to pain, weakness in one or both legs.  He has not had treatment to date for this and no previous workup as well.  PMHx - Updated and reviewed.  Contributory factors include: HTN, COPD, BPH, Tobacco use  PSHx - Updated and reviewed.  Contributory factors include:  Pacemaker  FHx - Updated and reviewed.  Contributory factors include:  [place] Lung sister  Social Hx - Updated and reviewed. Contributory factors include: current smoker  Medications - Updated/reviewed    ROS Per HPI.  12 point negative other than per HPI.   Exam:  Filed Vitals:   05/01/16 1049  BP: 137/92   Gen:  NAD, AAO 3 Cardio- RRR Pulm - Normal respiratory effort/rate Skin: No rashes or erythema Extremities: No edema  Vascular: pulses +2 bilateral upper and lower extremity Psych: Normal affect  Wrist Exam B/L: Wrist: Inspection normal with no visible erythema or swelling. Decreased palmar/dorsiflexion B/L. TTP Radioscaphoid B/L.  TTP L TFCC, especially with Extension/ulnar deviation axial loading.  Palpation is normal over metacarpals, navicular, lunate; tendons without tenderness/ swelling Strength 5/5 in all directions without pain. Negative Finkelstein, tinel's and phalens.  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

## 2016-05-02 MED FILL — SERTRALINE HCL 50 MG TABLET: 50 | 30 days supply | Qty: 30 | Fill #2

## 2016-05-02 MED FILL — TAMSULOSIN HCL 0.4 MG CAP: 0.4 | 30 days supply | Qty: 30 | Fill #2

## 2016-05-02 MED FILL — ATORVASTATIN 20 MG TABLET: 20 | 30 days supply | Qty: 30 | Fill #3

## 2016-05-06 LAB — CUP PACEART INCLINIC DEVICE CHECK
Date Time Interrogation Session: 20170523093455
Implantable Lead Implant Date: 20151228
Implantable Lead Location: 753860
Lead Channel Setting Pacing Pulse Width: 0.4 ms
MDC IDC LEAD IMPLANT DT: 20151228
MDC IDC LEAD LOCATION: 753859
MDC IDC PG SERIAL: 7699241
MDC IDC SET LEADCHNL RA PACING AMPLITUDE: 2 V
MDC IDC SET LEADCHNL RV PACING AMPLITUDE: 2.5 V
Pulse Gen Model: 2240

## 2016-05-07 ENCOUNTER — Encounter: Payer: Self-pay | Admitting: Internal Medicine

## 2016-05-13 ENCOUNTER — Telehealth: Payer: Self-pay | Admitting: Nurse Practitioner

## 2016-05-13 NOTE — Telephone Encounter (Signed)
Message For: OFC                  Taken 30-MAY-17 at  1:36PM by TMW ------------------------------------------------------------  Monia Pouch             CID  [id number]   Patient  SAME                  Pt's Dr  Jannifer Franklin        Area Code  336  Phone#  [phone]      RE  PCB TO CONFIRM WHEN APPT IS,THINKS ITS TOMORROW                                                         Disp:Y/N  N  If Y = C/B If No Response In 31minutes  ============================================================  Pt told appt date and time.

## 2016-05-14 ENCOUNTER — Other Ambulatory Visit: Payer: Medicaid Other

## 2016-05-20 ENCOUNTER — Ambulatory Visit (INDEPENDENT_AMBULATORY_CARE_PROVIDER_SITE_OTHER): Payer: Medicaid Other | Admitting: Cardiovascular Disease

## 2016-05-20 ENCOUNTER — Encounter: Payer: Self-pay | Admitting: Cardiovascular Disease

## 2016-05-20 VITALS — BP 138/90 | HR 59 | Ht 76.0 in | Wt 204.0 lb

## 2016-05-20 DIAGNOSIS — I10 Essential (primary) hypertension: Secondary | ICD-10-CM

## 2016-05-20 DIAGNOSIS — I495 Sick sinus syndrome: Secondary | ICD-10-CM | POA: Diagnosis not present

## 2016-05-20 DIAGNOSIS — R55 Syncope and collapse: Secondary | ICD-10-CM

## 2016-05-20 DIAGNOSIS — J449 Chronic obstructive pulmonary disease, unspecified: Secondary | ICD-10-CM

## 2016-05-20 DIAGNOSIS — Z950 Presence of cardiac pacemaker: Secondary | ICD-10-CM

## 2016-05-20 DIAGNOSIS — I6523 Occlusion and stenosis of bilateral carotid arteries: Secondary | ICD-10-CM

## 2016-05-20 DIAGNOSIS — E785 Hyperlipidemia, unspecified: Secondary | ICD-10-CM

## 2016-05-20 LAB — CUP PACEART INCLINIC DEVICE CHECK
Date Time Interrogation Session: 20170606104511
Implantable Lead Location: 753860
Lead Channel Sensing Intrinsic Amplitude: 3.1 mV
Lead Channel Setting Pacing Amplitude: 2 V
MDC IDC LEAD IMPLANT DT: 20151228
MDC IDC LEAD IMPLANT DT: 20151228
MDC IDC LEAD LOCATION: 753859
MDC IDC MSMT LEADCHNL RV SENSING INTR AMPL: 4.6 mV
MDC IDC SET LEADCHNL RV PACING AMPLITUDE: 2.5 V
MDC IDC SET LEADCHNL RV PACING PULSEWIDTH: 0.4 ms
Pulse Gen Model: 2240
Pulse Gen Serial Number: 7699241

## 2016-05-20 NOTE — Patient Instructions (Signed)
Dr Sallyanne Kuster recommends that you continue on your current medications as directed. Please refer to the Current Medication list given to you today.  Remote monitoring is used to monitor your Pacemaker of ICD from home. This monitoring reduces the number of office visits required to check your device to one time per year. It allows [place] to keep an eye on the functioning of your device to ensure it is working properly. You are scheduled for a device check from home on Tuesday, September 5th, 2017. You may send your transmission at any time that day. If you have a wireless device, the transmission will be sent automatically. After your physician reviews your transmission, you will receive a postcard with your next transmission date.  Dr Sallyanne Kuster recommends that you schedule a follow-up appointment in 12 months with a pacemaker check. You will receive a reminder letter in the mail two months in advance. If you don't receive a letter, please call our office to schedule the follow-up appointment.  If you need a refill on your cardiac medications before your next appointment, please call your pharmacy.
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
## 2016-05-21 ENCOUNTER — Ambulatory Visit (INDEPENDENT_AMBULATORY_CARE_PROVIDER_SITE_OTHER): Payer: Medicaid Other | Admitting: Family Medicine

## 2016-05-21 ENCOUNTER — Encounter: Payer: Medicaid Other | Admitting: Cardiovascular Disease

## 2016-05-21 ENCOUNTER — Encounter: Payer: Self-pay | Admitting: Family Medicine

## 2016-05-21 VITALS — BP 133/88 | Ht 74.0 in | Wt 204.0 lb

## 2016-05-21 DIAGNOSIS — M545 Low back pain: Secondary | ICD-10-CM | POA: Diagnosis not present

## 2016-05-21 DIAGNOSIS — M25532 Pain in left wrist: Secondary | ICD-10-CM

## 2016-05-21 DIAGNOSIS — M25531 Pain in right wrist: Secondary | ICD-10-CM | POA: Diagnosis present

## 2016-05-21 MED ORDER — METHYLPREDNISOLONE ACETATE 40 MG/ML IJ SUSP
40.0000 mg | Freq: Once | INTRAMUSCULAR | Status: AC
  Administered 2016-05-21: 40 mg via INTRA_ARTICULAR

## 2016-05-21 NOTE — Assessment & Plan Note (Signed)
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   - Continue Mobic. - Bilateral wrist injections today. - F/U in 4 weeks.  If conservative measures fail, referral to hand surgeon would be appropriate.     Aspiration/Injection Procedure Note Wesley [name] 1956/06/30  Procedure: Injection Indications: b/l wrist injection pain  Procedure Details Consent: Risks of procedure as well as the alternatives and risks of each were explained to the (patient/caregiver).  Consent for procedure obtained. Time Out: Verified patient identification, verified procedure, site/side was marked, verified correct patient position, special equipment/implants available, medications/allergies/relevent history reviewed, required imaging and test results available.  Performed.  The area was cleaned with iodine and alcohol swabs.    The b/l radiocarpal jt was injected using 1 cc's of 1 Depomedrol and 1 cc's of 1% lidocaine with a 21 1 1/2" needle.  Ultrasound was used. Images were obtained in Transverse and Long views showing the injection.    A sterile dressing was applied.  Patient did tolerate procedure well. Estimated blood loss: none

## 2016-05-21 NOTE — Progress Notes (Signed)
  Wesley [name] - 60 y.o. male MRN [id number]  Date of birth: 30-Sep-1956 Wesley [name] is a 60 y.o. male who presents today for back pain and wrist pain.  Wrist pain, initial visit 04/18/16-patient presents today with several months/over a year of ongoing right greater than left wrist pain. Pain localized in the ulnar aspect at the carpal region. Denies any swelling today but states that he has had swelling previously. There is no erythema and denies fevers chills night sweats. Has not tried anything to date other than wrapping with an Ace wrap that helps minimally. He is right hand dominant has never injured either wrist before. No paresthesias going distally into his hand.  Wrist Pain B/L 05/01/16 - Pt returns today for ongoing B/L wrist pain.  R>L, and last visit was placed on steroids that greatly helped his pain.  Also using wrist splint on the R which has helped.  No paresthesias.  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    Wrist pain b/l 05/21/16 - patient having ongoing wrist pain but has improved on Mobic. Right is greater than left. Denies any new symptoms.  Back pain, initial visit 04/18/16-patient presents today for lumbar back pain as well. This is been ongoing now for several years and has never had this worked up. Denies any paresthesias going into his lower extremities and denies any weakness with walking.  Pt denies any current bowel/bladder problems, fever, chills, unintentional weight loss, night time awakenings secondary to pain, weakness in one or both legs.  He has not had treatment to date for this and no previous workup as well.  PMHx - Updated and reviewed.  Contributory factors include: HTN, COPD, BPH, Tobacco use  PSHx - Updated and reviewed.  Contributory factors include:  Pacemaker  FHx - Updated and reviewed.  Contributory factors include:  [facility] Lung sister  Social Hx - Updated and reviewed. Contributory factors include: current smoker  Medications -  Updated/reviewed    ROS Per HPI.  12 point negative other than per HPI.   Exam:  Filed Vitals:   05/21/16 1342  BP: 133/88   Gen: NAD, AAO 3 Cardio- RRR Pulm - Normal respiratory effort/rate Skin: No rashes or erythema Extremities: No edema  Vascular: pulses +2 bilateral upper and lower extremity Psych: Normal affect  Wrist Exam B/L: Wrist: Inspection normal with no visible erythema or swelling. Decreased palmar/dorsiflexion B/L. TTP Radioscaphoid B/L.  TTP L TFCC, especially with Extension/ulnar deviation axial loading.  Palpation is normal over metacarpals, navicular, lunate; tendons without tenderness/ swelling Strength 5/5 in all directions without pain. Negative Finkelstein, tinel's and phalens.  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

## 2016-05-22 ENCOUNTER — Encounter: Payer: Self-pay | Admitting: Cardiovascular Disease

## 2016-05-22 DIAGNOSIS — I6529 Occlusion and stenosis of unspecified carotid artery: Secondary | ICD-10-CM | POA: Insufficient documentation

## 2016-05-22 DIAGNOSIS — I495 Sick sinus syndrome: Secondary | ICD-10-CM | POA: Insufficient documentation

## 2016-05-26 ENCOUNTER — Encounter: Payer: Self-pay | Admitting: Cardiovascular Disease

## 2016-06-18 ENCOUNTER — Encounter: Payer: Self-pay | Admitting: Sports Medicine

## 2016-06-18 ENCOUNTER — Other Ambulatory Visit: Payer: Self-pay | Admitting: Family Medicine

## 2016-06-18 ENCOUNTER — Ambulatory Visit (INDEPENDENT_AMBULATORY_CARE_PROVIDER_SITE_OTHER): Payer: Medicaid Other | Admitting: Sports Medicine

## 2016-06-18 VITALS — BP 135/90 | HR 60 | Ht 74.0 in | Wt 204.0 lb

## 2016-06-18 DIAGNOSIS — M25562 Pain in left knee: Secondary | ICD-10-CM

## 2016-06-18 MED FILL — MELOXICAM 15 MG TABLET: 15 | 30 days supply | Qty: 30 | Fill #1

## 2016-06-18 MED FILL — SERTRALINE HCL 50 MG TABLET: 50 | 30 days supply | Qty: 30 | Fill #3

## 2016-06-18 MED FILL — ATORVASTATIN 20 MG TABLET: 20 | 30 days supply | Qty: 30 | Fill #4

## 2016-06-18 MED FILL — SPIRIVA 18 MCG CP-HANDIHALE: 18 | 30 days supply | Qty: 30 | Fill #1

## 2016-06-18 MED FILL — METOPROLOL TARTRATE 25 MG T: 25 | 30 days supply | Qty: 60 | Fill #1

## 2016-06-18 MED FILL — TAMSULOSIN HCL 0.4 MG CAP: 0.4 | 30 days supply | Qty: 30 | Fill #3
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
## 2016-06-19 ENCOUNTER — Ambulatory Visit (INDEPENDENT_AMBULATORY_CARE_PROVIDER_SITE_OTHER): Payer: Medicaid Other | Admitting: Neurology

## 2016-06-19 ENCOUNTER — Telehealth: Payer: Self-pay | Admitting: Neurology

## 2016-06-19 DIAGNOSIS — R55 Syncope and collapse: Secondary | ICD-10-CM

## 2016-06-19 NOTE — Telephone Encounter (Signed)
Did you want to refill this?

## 2016-06-19 NOTE — Telephone Encounter (Addendum)
Returned call and spoke to both pt and his wife. Relayed Dr. Jannifer Franklin' mssg below re EEG results. Let him know that vasovagal syncope can occur due to a sudden drop in heart rate and/or blood pressure leading to fainting. Encouraged to keep hydrated and rest w/ feet elevated if feeling dizzy/lightheaded. Hopefully, previous adjustment in BP meds may help as well. Verbalized understanding and appreciation for call. F/u scheduled w/ Hoyle Sauer NP next month.
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
## 2016-06-19 NOTE — Telephone Encounter (Signed)
Pt called back, her phone is not working properly and she could not listen to the entire message. Please call

## 2016-06-19 NOTE — Telephone Encounter (Signed)
I called the patient, EEG study was normal. The syncope events likely represent vasovagal events.

## 2016-06-20 ENCOUNTER — Encounter: Payer: Self-pay | Admitting: Family Medicine

## 2016-06-20 ENCOUNTER — Other Ambulatory Visit: Payer: Self-pay | Admitting: Family Medicine

## 2016-06-20 ENCOUNTER — Ambulatory Visit: Payer: Medicaid Other | Attending: Family Medicine | Admitting: Family Medicine

## 2016-06-20 VITALS — BP 131/86 | HR 58 | Temp 99.0°F | Resp 16 | Ht 74.0 in | Wt 209.0 lb

## 2016-06-20 DIAGNOSIS — M25562 Pain in left knee: Secondary | ICD-10-CM

## 2016-06-20 DIAGNOSIS — F32A Depression, unspecified: Secondary | ICD-10-CM

## 2016-06-20 DIAGNOSIS — H9193 Unspecified hearing loss, bilateral: Secondary | ICD-10-CM

## 2016-06-20 DIAGNOSIS — I10 Essential (primary) hypertension: Secondary | ICD-10-CM

## 2016-06-20 DIAGNOSIS — R0683 Snoring: Secondary | ICD-10-CM

## 2016-06-20 DIAGNOSIS — F329 Major depressive disorder, single episode, unspecified: Secondary | ICD-10-CM

## 2016-06-20 DIAGNOSIS — F419 Anxiety disorder, unspecified: Secondary | ICD-10-CM

## 2016-06-20 DIAGNOSIS — F418 Other specified anxiety disorders: Secondary | ICD-10-CM

## 2016-06-20 MED ORDER — SERTRALINE HCL 100 MG PO TABS
100.0000 mg | ORAL_TABLET | Freq: Every day | ORAL | Status: DC
Start: 2016-06-20 — End: 2016-07-22

## 2016-06-20 MED ORDER — MELOXICAM 15 MG PO TABS: 15.0000 mg | ORAL_TABLET | Freq: Every day | ORAL | Status: DC

## 2016-06-20 NOTE — Patient Instructions (Addendum)
Wesley [name] was seen today for hypertension.  Diagnoses and all orders for this visit:  Hearing loss, bilateral -     Ambulatory referral to Audiology  Snoring -     Split night study; Future  Left knee pain -     meloxicam (MOBIC) 15 MG tablet; Take 1 tablet (15 mg total) by mouth daily.    I do not recommend circumcision unless you develop recurrent infection of the head of the penis called balanitis  We will discuss erectile dysfunction at your follow up appt in 4 weeks   F/u in 4 weeks for erectile dysfunction   Dr. Adrian Blackwater
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
## 2016-06-20 NOTE — Assessment & Plan Note (Signed)
Controlled. Continue current regimen. 

## 2016-06-20 NOTE — Progress Notes (Signed)
Subjective:  Patient ID: Wesley [name], male    DOB: 11/03/1956  Age: 60 y.o. MRN: [id number]  CC: Hypertension   HPI LASH HOLLOMON has pacemaker for possible SSS, recent syncope, COPD, HTN, HLD he  presents for    1. HTN: compliant with medication. Has SOB with exertion. No CP or swelling. He continues to smoke.   2. Shortness of breath: this is chronic. He has COPD confirmed on recent PFTs with FEV1 52 % of predicted. No hx of anemia. No GI bleed. He feels SOB with exertion. He snores. He does not sleep well. Also feels fatigue during hte day. He continues to smoke. He is using albuterol and Spiriva.   3. Ringing in ears and dizziness: this is chronic. No ear pain or drainage. Has not been evaluated by audiology.   4. Syncope: no recurrent. Normal awake EEG. Normal ECHO. Pacemaker is functioning normally. He still feels dizzy and lightheaded at times. When he does he sits down. He does not elevate legs. He had increased fluid intake.   5. L knee pain: this is chronic. He is followed by sports medicine. He is not taking mobic. No swelling or redness. There is stiffness and pain with knee flexion.   6. ? Circumcision: no hx of penile infections. His wife is requesting this as she gets recurrent BV.   7. Erectile dysfunction: discussion deferred to next visit.   Social History  Substance Use Topics  . Smoking status: Current Some Day Smoker -- 0.05 packs/day for 40 years    Types: Cigarettes  . Smokeless tobacco: Never Used  . Alcohol Use: No     Comment: hx of ETOH abuse from 2001-2016     Outpatient Prescriptions Prior to Visit  Medication Sig Dispense Refill  . albuterol (PROVENTIL HFA;VENTOLIN HFA) 108 (90 Base) MCG/ACT inhaler Inhale 2 puffs into the lungs every 6 (six) hours as needed for wheezing or shortness of breath. 1 Inhaler 2  . amLODipine (NORVASC) 10 MG tablet Take 10 mg by mouth daily.    Marland Kitchen atorvastatin (LIPITOR) 20 MG tablet Take 1 tablet (20 mg total)  by mouth daily. 30 tablet 5  . Cholecalciferol (VITAMIN D3) 2000 UNITS TABS Take 2,000 Units by mouth daily. 30 tablet 11  . lisinopril (PRINIVIL,ZESTRIL) 40 MG tablet Take 1 tablet by mouth daily.  5  . meloxicam (MOBIC) 15 MG tablet Take 1 tablet (15 mg total) by mouth daily. 30 tablet 2  . pantoprazole (PROTONIX) 40 MG tablet Take 1 tablet (40 mg total) by mouth daily. 30 tablet 0  . sucralfate (CARAFATE) 1 GM/10ML suspension Take 10 mLs (1 g total) by mouth 4 (four) times daily -  with meals and at bedtime. 420 mL 0  . tamsulosin (FLOMAX) 0.4 MG CAPS capsule Take 1 capsule (0.4 mg total) by mouth daily. 30 capsule 3  . brimonidine-timolol (COMBIGAN) 0.2-0.5 % ophthalmic solution Place 1 drop into both eyes every 12 (twelve) hours. Reported on 06/20/2016    . Bromfenac Sodium (PROLENSA) 0.07 % SOLN Place 1 drop into the left eye daily. Reported on 06/20/2016    . Difluprednate (DUREZOL) 0.05 % EMUL Place 1 drop into both eyes daily. Reported on 06/20/2016    . Elastic Bandages & Supports (WRIST SPLINT/ELASTIC LEFT LG) MISC 1 each by Does not apply route daily. 1 each 0  . Elastic Bandages & Supports (WRIST SPLINT/ELASTIC RIGHT LG) MISC 1 each by Does not apply route at bedtime. 1 each 0  .  predniSONE (DELTASONE) 10 MG tablet 5 tabs PO x 2 days, 4 tabs PO x 2 days, 3 tabs PO x 2 days, 2 tabs PO x 2 days, 1 tab PO x 2 days 30 tablet 0  . tiotropium (SPIRIVA HANDIHALER) 18 MCG inhalation capsule Place 1 capsule (18 mcg total) into inhaler and inhale daily. 30 capsule 12   No facility-administered medications prior to visit.    ROS Review of Systems  Constitutional: Negative for fever, chills, fatigue and unexpected weight change.  HENT: Positive for hearing loss and sore throat.   Eyes: Negative for visual disturbance. Eye pain: R eye, recent surgery   Respiratory: Positive for chest tightness and shortness of breath. Negative for cough.   Cardiovascular: Negative for chest pain, palpitations and  leg swelling.  Gastrointestinal: Negative for nausea, vomiting, abdominal pain, diarrhea, constipation, blood in stool, anal bleeding and rectal pain.  Endocrine: Negative for polydipsia, polyphagia and polyuria.  Musculoskeletal: Positive for arthralgias. Negative for myalgias, back pain, gait problem and neck pain.  Skin: Negative for rash.  Allergic/Immunologic: Negative for immunocompromised state.  Neurological: Positive for dizziness and numbness.  Hematological: Negative for adenopathy. Does not bruise/bleed easily.  Psychiatric/Behavioral: Positive for dysphoric mood. Negative for suicidal ideas and sleep disturbance. The patient is not nervous/anxious.     Objective:  BP 131/86 mmHg  Pulse 58  Temp(Src) 99 F (37.2 C) (Oral)  Resp 16  Ht 6\' 2"  (1.88 m)  Wt 209 lb (94.802 kg)  BMI 26.82 kg/m2  SpO2 100%  BP/Weight 06/20/2016 [id number] [id number]  Systolic BP A999333 A999333 [id number]  Diastolic BP 86 90 88  Wt. (Lbs) 209 204 204  BMI 26.82 26.18 26.18    Physical Exam  Constitutional: He appears well-developed and well-nourished. No distress.  HENT:  Head: Normocephalic and atraumatic.  Right Ear: Tympanic membrane, external ear and ear canal normal.  Left Ear: Tympanic membrane, external ear and ear canal normal.  Nose: Mucosal edema present.  Neck: Normal range of motion. Neck supple.  Cardiovascular: Normal rate, regular rhythm, normal heart sounds and intact distal pulses.   Pulmonary/Chest: Effort normal and breath sounds normal.  Musculoskeletal: He exhibits no edema or tenderness.       Left knee: Normal.  Neurological: He is alert. He displays normal reflexes. No cranial nerve deficit. He exhibits normal muscle tone. Coordination normal.  Skin: Skin is warm and dry. No rash noted. No erythema.  Psychiatric: He has a normal mood and affect.     Assessment & Plan:   There are no diagnoses linked to this encounter. Treydan was seen today for hypertension.  Diagnoses and  all orders for this visit:  Hearing loss, bilateral -     Ambulatory referral to Audiology  Snoring -     Split night study; Future  Left knee pain -     meloxicam (MOBIC) 15 MG tablet; Take 1 tablet (15 mg total) by mouth daily.  Anxiety and depression -     sertraline (ZOLOFT) 100 MG tablet; Take 1 tablet (100 mg total) by mouth daily.   Meds ordered this encounter  Medications  . meloxicam (MOBIC) 15 MG tablet    Sig: Take 1 tablet (15 mg total) by mouth daily.    Dispense:  30 tablet    Refill:  2  . sertraline (ZOLOFT) 100 MG tablet    Sig: Take 1 tablet (100 mg total) by mouth daily.    Dispense:  30 tablet    Refill:  3    Follow-up: No Follow-up on file.   Boykin Nearing MD

## 2016-06-27 ENCOUNTER — Ambulatory Visit: Payer: Medicaid Other | Attending: Family Medicine | Admitting: Audiology

## 2016-06-27 DIAGNOSIS — H9313 Tinnitus, bilateral: Secondary | ICD-10-CM | POA: Insufficient documentation

## 2016-06-27 DIAGNOSIS — R292 Abnormal reflex: Secondary | ICD-10-CM

## 2016-06-27 DIAGNOSIS — H905 Unspecified sensorineural hearing loss: Secondary | ICD-10-CM | POA: Insufficient documentation

## 2016-06-27 DIAGNOSIS — H903 Sensorineural hearing loss, bilateral: Secondary | ICD-10-CM | POA: Diagnosis not present

## 2016-06-27 DIAGNOSIS — H93293 Other abnormal auditory perceptions, bilateral: Secondary | ICD-10-CM | POA: Diagnosis present

## 2016-06-27 NOTE — Patient Instructions (Signed)
Wesley [name] has a moderate to moderately severe hearing loss on the left side and a moderate sloping to profound hearing loss on the right side. The hearing loss is sensorineural bilaterally.  Wesley [name] has fair word recognition on the right side and good word recognition on the left side at very loud levels, in quiet. However, in minimal background noise his word recognition drops to poor in both ears.  A hearing aid evaluation is strongly recommended.  RECOMMENDATIONS: 1)  Referral to an Ear, Nose and Throat physician because of the asymetrical hearing loss with the right ear high frequencies poorer.      Recommend [facility] because they also participate with a free hearing aid program through [place].  2)  A hearing aid evaluation.  Amplification helps make the signal louder and therefore often improves hearing and word recognition.  Amplification has many forms including hearing aids in one or both ears, an assistive listening device which have a microphone and speaker such as a small handheld device and/or even a surround sound system of speakers.  Amplification may be covered by some insurances, but not all.  It is important to note that hearing aids must be individually fit according to the hearing test results and the ear shape.  Audiologists and hearing aid dealers in [place] must be licensed in order to dispense hearing aids.  In addition, a trial period is mandated by law in our state because often amplification must be tried and then evaluated in order to determine benefit.    [employer] Distribution Services in [place] may help with obtaining one hearing aid or one captioned telephone if hearing loss and financial qualifications are met.  Please contact [name] at [phone] .  List of Providers for [employer] [facility] June 14, 2012 - June 13, 2014. The following hearing aid companies have contracted with [place] to fit  hearing aids for eligible applicants. Representatives of these companies are not [facility] employees. Inclusion on this list means the company agrees to the terms of contract pricing and inclusion is not an endorsement of their services over those who are not contracted with the division. An applicant may choose any provider listed from your region to assist in obtaining hearing aids through this service. If problems arise during this process, please contact the [place] that provided the application.  [facility] Vendor List 1 Updated; April 11, 2013  [facility]   [place] [phone]  3)  Strategies that help improve hearing include: A) Face the speaker directly. Optimal is having the speakers face well - lit.  Unless amplified, being within 3-6 feet of the speaker will enhance word recognition. B) Avoid having the speaker back-lit as this will minimize the ability to use cues from lip-reading, facial expression and gestures. C)  Word recognition is poorer in background noise. For optimal word recognition, turn off the TV, radio or noisy fan when engaging in conversation. In a restaurant, try to sit away from noise sources and close to the primary speaker.  D)  Ask for topic clarification from time to time in order to remain in the conversation.  Most people don't mind repeating or clarifying a point when asked.  If needed, explain the difficulty hearing in background noise or hearing loss.  Wesley [name], Au.D., CCC-A Doctor of Audiology 06/27/2016

## 2016-06-27 NOTE — Procedures (Signed)
[facility] [place]  [place] [place] [address]  [phone]   Audiological Evaluation  Patient Name: Wesley [name]   Status: Outpatient   DOB: 1956-04-30    Diagnosis: Failed hearing screen MRN: [id number] Date:  06/27/2016     Referent: Minerva Ends, MD  History: [name] was seen for an audiological evaluation.   He has noticed "ringing in his ear (s) and hearing loss that is worse on the right side. He states that he has "difficulty understanding others when (he) is not looking at them".  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  [name] states that he is in the process of investigating "VA benefits".   Evaluation: Conventional pure tone audiometry from _0  - _1  with using insert earphones. Hearing thresholds are symmetrical and range from 40-55dBHL from _2  - _3 . In the higher frequencies, from _4 - _5  the right ear is poorer and ranges from 90 dBNL to no response and the left ear ranges from 65dbHL to 45 dbHL.  The hearing loss appears sensorineural bilaterally.Reliability is good Speech reception levels (repeating words near threshold) using recorded spondee word lists:  Right ear: 50 dBHL.  Left ear:  45 dBHL Word recognition (at comfortably loud volumes) using recorded NU-6 word lists, in quiet.  Right ear: 72% at 90dBHL.  Left ear:   84% at 85 dBHL Word recognition in minimal background noise:  +5 dBHL  Right ear: approximately 20%                              Left ear:  32%  Tympanometry shows normal middle ear volume, pressure and compliance (Type A) with absent ipsilateral acoustic reflexes at _6  bilaterally.   CONCLUSION:  AMANDA POTE needs a referral to an ENT to rule out retrocochlear issues because he has asymmetrical hearing loss that is poorer on the right side.  Once  medical clearance is obtained, a hearing aid evaluation is strongly recommended. Please consider referral to [facility] because to my knowledge they participate with the hearing aid program mentioned in recommendation #2.    [name]  has a moderate to moderately severe hearing loss on the left side and a moderate sloping to profound hearing loss on the right side. The hearing loss is sensorineural bilaterally.  [name] has fair word recognition on the right side and good word recognition on the left side at very loud levels, in quiet. However, in minimal background noise his word recognition drops to poor in both ears.     RECOMMENDATIONS: 1)  Referral to an Ear, Nose and Throat physician because of the asymmetrical right high frequency hearing loss with tinnitus   Recommend [facility] because they also participate with a free hearing aid program through [employer] [place].  2)  A hearing aid evaluation.  Amplification helps make the signal louder and therefore often improves hearing and word recognition.  Amplification has many forms including hearing aids in one or both ears, an assistive listening device which have a microphone and speaker such as a small handheld device and/or even a surround sound system of speakers.  Amplification may be covered by some insurances, but not all.  It is important to note that hearing aids must be individually fit according to the hearing  test results and the ear shape.  Audiologists and hearing aid dealers in [place] must be licensed in order to dispense hearing aids.  In addition, a trial period is mandated by law in our state because often amplification must be tried and then evaluated in order to determine benefit.  Equ[employer] Distribution Services in [place] may help with obtaining one hearing aid or one captioned telephone if hearing loss and financial qualifications are met.  Please contact [name] at [phone] for  further information.  List of Providers for [employer] Hearing Aid Distribution [facility] June 14, 2012 - June 13, 2014. The following hearing aid companies have contracted with [place] to fit hearing aids for eligible applicants. Representatives of these companies are not [facility] employees. Inclusion on this list means the company agrees to the terms of contract pricing and inclusion is not an endorsement of their services over those who are not contracted with the division. An applicant may choose any provider listed from your region to assist in obtaining hearing aids through this service. If problems arise during this process, please contact the [place] that provided the application.  [facility] Vendor List 1 Updated; April 11, 2013  [facility]   [place] [phone]  3)  Strategies that help improve hearing include: A) Face the speaker directly. Optimal is having the speakers face well - lit.  Unless amplified, being within 3-6 feet of the speaker will enhance word recognition. B) Avoid having the speaker back-lit as this will minimize the ability to use cues from lip-reading, facial expression and gestures. C)  Word recognition is poorer in background noise. For optimal word recognition, turn off the TV, radio or noisy fan when engaging in conversation. In a restaurant, try to sit away from noise sources and close to the primary speaker.  D)  Ask for topic clarification from time to time in order to remain in the conversation.  Most people don't mind repeating or clarifying a point when asked.  If needed, explain the difficulty hearing in background noise or hearing loss.  Deborah L. Heide Spark, Au.D., CCC-A Doctor of Audiology 06/27/2016

## 2016-07-01 ENCOUNTER — Encounter: Payer: Self-pay | Admitting: Sports Medicine

## 2016-07-01 ENCOUNTER — Ambulatory Visit (INDEPENDENT_AMBULATORY_CARE_PROVIDER_SITE_OTHER): Payer: Medicaid Other | Admitting: Sports Medicine

## 2016-07-01 VITALS — BP 140/84 | Ht 74.0 in | Wt 209.0 lb

## 2016-07-01 DIAGNOSIS — M25562 Pain in left knee: Secondary | ICD-10-CM | POA: Diagnosis not present

## 2016-07-01 MED ORDER — METHYLPREDNISOLONE ACETATE 40 MG/ML IJ SUSP
40.0000 mg | Freq: Once | INTRAMUSCULAR | Status: AC
  Administered 2016-07-01: 40 mg via INTRA_ARTICULAR
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
## 2016-07-02 ENCOUNTER — Telehealth: Payer: Self-pay | Admitting: Family Medicine

## 2016-07-02 NOTE — Telephone Encounter (Signed)
Patient needs referral to [facility] ent. Please follow up

## 2016-07-02 NOTE — Telephone Encounter (Signed)
Patient dropped off paperwork to be completed by the doctor

## 2016-07-02 NOTE — Telephone Encounter (Signed)
Is no referral in place. Please, sent message to his pcp . Thank You

## 2016-07-02 NOTE — Telephone Encounter (Signed)
Patient needs referral to [facility]NT

## 2016-07-04 NOTE — Telephone Encounter (Signed)
Received form Which was a [employer] dental form for medical recommendations regarding patient's BP goal for treatment < 160/100 And regarding pacemaker- no need for infectious endocarditis prophylaxis  Patient called left VM requesting that he return to pick up form as he forgot to sign on the line authorizing release of medical information.

## 2016-07-22 ENCOUNTER — Encounter: Payer: Self-pay | Admitting: Family Medicine

## 2016-07-22 ENCOUNTER — Ambulatory Visit: Payer: Medicaid Other | Attending: Family Medicine | Admitting: Family Medicine

## 2016-07-22 VITALS — BP 125/84 | HR 59 | Temp 98.1°F | Ht 74.0 in | Wt 213.2 lb

## 2016-07-22 DIAGNOSIS — R7309 Other abnormal glucose: Secondary | ICD-10-CM | POA: Diagnosis not present

## 2016-07-22 DIAGNOSIS — H9193 Unspecified hearing loss, bilateral: Secondary | ICD-10-CM | POA: Diagnosis not present

## 2016-07-22 DIAGNOSIS — N529 Male erectile dysfunction, unspecified: Secondary | ICD-10-CM | POA: Diagnosis not present

## 2016-07-22 DIAGNOSIS — N400 Enlarged prostate without lower urinary tract symptoms: Secondary | ICD-10-CM

## 2016-07-22 LAB — POCT URINALYSIS DIPSTICK
Bilirubin, UA: NEGATIVE
Blood, UA: NEGATIVE
Glucose, UA: NEGATIVE
KETONES UA: NEGATIVE
NITRITE UA: NEGATIVE
PH UA: 5.5
PROTEIN UA: NEGATIVE
Spec Grav, UA: >=1.03
Urobilinogen, UA: 0.2

## 2016-07-22 LAB — GLUCOSE, POCT (MANUAL RESULT ENTRY): POC GLUCOSE: 133 mg/dL — AB (ref 70–99)

## 2016-07-22 LAB — HEMOCCULT GUIAC POC 1CARD (OFFICE): Fecal Occult Blood, POC: NEGATIVE

## 2016-07-22 LAB — POCT GLYCOSYLATED HEMOGLOBIN (HGB A1C): Hemoglobin A1C: 6.7

## 2016-07-22 MED ORDER — TAMSULOSIN HCL 0.4 MG PO CAPS: ORAL_CAPSULE | ORAL | 5 refills | Status: DC

## 2016-07-22 NOTE — Assessment & Plan Note (Signed)
ENT referral placed for R > L ear hearing loss

## 2016-07-22 NOTE — Progress Notes (Signed)
C/C; patient is here today to get his prostate checked.

## 2016-07-22 NOTE — Assessment & Plan Note (Signed)
BPH and ED  Restart flomax 0.4 mg for two weeks, then 0.8 mg nightly

## 2016-07-22 NOTE — Patient Instructions (Addendum)
Elder was seen today for erectile dysfunction.  Diagnoses and all orders for this visit:  Elevated hemoglobin A1c -     HgB A1c -     Glucose (CBG)  Hearing loss, bilateral -     Ambulatory referral to ENT  BPH (benign prostatic hyperplasia) -     POCT urinalysis dipstick -     Hemoccult - 1 Card (office) -     tamsulosin (FLOMAX) 0.4 MG CAPS capsule; 0.4 mg by mouth nightly after supper for two weeks, then 0.8 mg nightly  Erectile dysfunction, unspecified erectile dysfunction type   Decrease intake of sweets and carbohydrates to lower A1c   F/u in 3 months for BPH and ED    Dr. Adrian Blackwater

## 2016-07-22 NOTE — Progress Notes (Signed)
Subjective:  Patient ID: Wesley [name], male    DOB: [birth year]  Age: 60 y.o. MRN: [id number]  CC: Erectile Dysfunction   HPI KELLE TRUBY presents for   1. Erectile dysfunction: reports 3-4 years. Takes longer to urinate. Waking up at night to urinate about 3-4 times. He does not feel like bladder is fully empty. Around the same time noticed difficulty keeping an erection. Erections resolve before he is able to ejaculate. No OTC aids. No pain with uriantion. No blood with urine.   Social History  Substance Use Topics  . Smoking status: Current Some Day Smoker    Packs/day: 0.05    Years: 40.00    Types: Cigarettes  . Smokeless tobacco: Never Used  . Alcohol use No     Comment: hx of ETOH abuse from 2001-2016     Outpatient Medications Prior to Visit  Medication Sig Dispense Refill  . albuterol (PROVENTIL HFA;VENTOLIN HFA) 108 (90 Base) MCG/ACT inhaler Inhale 2 puffs into the lungs every 6 (six) hours as needed for wheezing or shortness of breath. 1 Inhaler 2  . amLODipine (NORVASC) 10 MG tablet Take 10 mg by mouth daily.    Marland Kitchen atorvastatin (LIPITOR) 20 MG tablet Take 1 tablet (20 mg total) by mouth daily. 30 tablet 5  . brimonidine-timolol (COMBIGAN) 0.2-0.5 % ophthalmic solution Place 1 drop into both eyes every 12 (twelve) hours. Reported on 06/20/2016    . Bromfenac Sodium (PROLENSA) 0.07 % SOLN Place 1 drop into the left eye daily. Reported on 06/20/2016    . Cholecalciferol (VITAMIN D3) 2000 UNITS TABS Take 2,000 Units by mouth daily. 30 tablet 11  . Elastic Bandages & Supports (WRIST SPLINT/ELASTIC LEFT LG) MISC 1 each by Does not apply route daily. 1 each 0  . Elastic Bandages & Supports (WRIST SPLINT/ELASTIC RIGHT LG) MISC 1 each by Does not apply route at bedtime. 1 each 0  . lisinopril (PRINIVIL,ZESTRIL) 40 MG tablet Take 1 tablet by mouth daily.  5  . meloxicam (MOBIC) 15 MG tablet Take 1 tablet (15 mg total) by mouth daily. 30 tablet 2  . pantoprazole (PROTONIX)  40 MG tablet Take 1 tablet (40 mg total) by mouth daily. 30 tablet 0  . sucralfate (CARAFATE) 1 GM/10ML suspension Take 10 mLs (1 g total) by mouth 4 (four) times daily -  with meals and at bedtime. 420 mL 0  . tamsulosin (FLOMAX) 0.4 MG CAPS capsule Take 1 capsule (0.4 mg total) by mouth daily. 30 capsule 3  . tiotropium (SPIRIVA HANDIHALER) 18 MCG inhalation capsule Place 1 capsule (18 mcg total) into inhaler and inhale daily. 30 capsule 12  . Difluprednate (DUREZOL) 0.05 % EMUL Place 1 drop into both eyes daily. Reported on 06/20/2016    . sertraline (ZOLOFT) 100 MG tablet Take 1 tablet (100 mg total) by mouth daily. (Patient not taking: Reported on 07/22/2016) 30 tablet 3   No facility-administered medications prior to visit.     ROS Review of Systems  Constitutional: Negative for chills, fatigue, fever and unexpected weight change.  HENT: Positive for hearing loss. Negative for sore throat.   Eyes: Negative for visual disturbance. Eye pain: R eye, recent surgery   Respiratory: Positive for chest tightness and shortness of breath. Negative for cough.   Cardiovascular: Negative for chest pain, palpitations and leg swelling.  Gastrointestinal: Negative for abdominal pain, anal bleeding, blood in stool, constipation, diarrhea, nausea, rectal pain and vomiting.  Endocrine: Negative for polydipsia, polyphagia and polyuria.  Genitourinary: Positive for frequency. Negative for decreased urine volume, difficulty urinating, discharge, dysuria, enuresis, flank pain, genital sores, hematuria, penile pain, penile swelling, scrotal swelling, testicular pain and urgency.  Musculoskeletal: Positive for arthralgias. Negative for back pain, gait problem, myalgias and neck pain.  Skin: Negative for rash.  Allergic/Immunologic: Negative for immunocompromised state.  Neurological: Positive for dizziness and numbness.  Hematological: Negative for adenopathy. Does not bruise/bleed easily.  Psychiatric/Behavioral:  Positive for dysphoric mood. Negative for sleep disturbance and suicidal ideas. The patient is not nervous/anxious.     Objective:  BP 125/84 (BP Location: Left Arm, Patient Position: Sitting, Cuff Size: Large)   Pulse (!) 59   Temp 98.1 F (36.7 C) (Oral)   Ht 6\' 2"  (1.88 m)   Wt 213 lb 3.2 oz (96.7 kg)   SpO2 96%   BMI 27.37 kg/m   BP/Weight 07/22/2016 [id number]  Systolic BP 0000000 [id number]  Diastolic BP 84 84 86  Wt. (Lbs) 213.2 209 209  BMI 27.37 26.82 26.82   Physical Exam  Constitutional: He appears well-developed and well-nourished. No distress.  HENT:  Head: Normocephalic and atraumatic.  Neck: Normal range of motion. Neck supple.  Cardiovascular: Normal rate, regular rhythm, normal heart sounds and intact distal pulses.   Pulmonary/Chest: Effort normal and breath sounds normal.  Genitourinary: Rectum normal, testes normal and penis normal. Rectal exam shows no external hemorrhoid, no internal hemorrhoid, no fissure, no mass, no tenderness, anal tone normal and guaiac negative stool. Prostate is enlarged. Prostate is not tender. Uncircumcised.  Musculoskeletal: He exhibits no edema.  Neurological: He is alert.  Skin: Skin is warm and dry. No rash noted. No erythema.  Psychiatric: He has a normal mood and affect.   Lab Results  Component Value Date   HGBA1C 6.6 (H) 04/05/2016   Lab Results  Component Value Date   HGBA1C 6.7 07/22/2016    CBG 133 UA: trace LE, otherwise wnl  Assessment & Plan:   Courtney was seen today for erectile dysfunction.  Diagnoses and all orders for this visit:  Elevated hemoglobin A1c -     HgB A1c -     Glucose (CBG)  Hearing loss, bilateral -     Ambulatory referral to ENT  BPH (benign prostatic hyperplasia) -     POCT urinalysis dipstick -     Hemoccult - 1 Card (office) -     tamsulosin (FLOMAX) 0.4 MG CAPS capsule; 0.4 mg by mouth nightly after supper for two weeks, then 0.8 mg nightly  Erectile dysfunction,  unspecified erectile dysfunction type    No orders of the defined types were placed in this encounter.   Follow-up: Return in about 3 months (around 10/22/2016) for BPH, ED, A1c check .   Boykin Nearing MD

## 2016-07-31 ENCOUNTER — Ambulatory Visit: Payer: Medicaid Other | Admitting: Nurse Practitioner

## 2016-08-04 ENCOUNTER — Telehealth: Payer: Self-pay | Admitting: Family Medicine

## 2016-08-04 ENCOUNTER — Encounter: Payer: Self-pay | Admitting: Nurse Practitioner

## 2016-08-04 ENCOUNTER — Ambulatory Visit (INDEPENDENT_AMBULATORY_CARE_PROVIDER_SITE_OTHER): Payer: Medicaid Other | Admitting: Nurse Practitioner

## 2016-08-04 VITALS — BP 132/76 | HR 60 | Ht 74.0 in | Wt 211.6 lb

## 2016-08-04 DIAGNOSIS — R55 Syncope and collapse: Secondary | ICD-10-CM | POA: Diagnosis not present

## 2016-08-04 DIAGNOSIS — G250 Essential tremor: Secondary | ICD-10-CM | POA: Diagnosis not present

## 2016-08-04 MED FILL — TAMSULOSIN HCL 0.4 MG CAP: 0.4 | 27 days supply | Qty: 60 | Fill #0

## 2016-08-04 MED FILL — MELOXICAM 15 MG TABLET: 15 | 30 days supply | Qty: 30 | Fill #0

## 2016-08-04 MED FILL — CLINDAMYCIN HCL 150 MG CAPS: 150 | 7 days supply | Qty: 21 | Fill #0

## 2016-08-04 MED FILL — LATANOPROST 0.005% EYE DRP: 0.005 | 30 days supply | Qty: 3 | Fill #0

## 2016-08-04 MED FILL — SERTRALINE HCL 100 MG TAB: 100 | 30 days supply | Qty: 30 | Fill #0

## 2016-08-04 MED FILL — ACETAMINOPHEN/COD #3 TABLET: 300-30 | 5 days supply | Qty: 20 | Fill #0

## 2016-08-04 NOTE — Patient Instructions (Addendum)
Given patient education material on vasovagal syncope Made aware EEG was normal Follow-up in 6 months

## 2016-08-04 NOTE — Progress Notes (Signed)
I have read the note, and I agree with the clinical assessment and plan.  WILLIS,CHARLES KEITH   
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
## 2016-08-04 NOTE — Telephone Encounter (Signed)
Patient came in the office to speak to nurse regarding pain he is experiencing. Pt will be seeing dentist Wednesday, but pain is getting worse each day. He would like to know what medication he can take in the meantime. Currently taking Motrin but not working. Please follow up.   Thank you.

## 2016-08-06 NOTE — Telephone Encounter (Signed)
Will route to PCP. Patient dentist referral was put in. Patient wants to know what meds he can take for pain.

## 2016-08-07 ENCOUNTER — Encounter: Payer: Self-pay | Admitting: Sports Medicine

## 2016-08-07 ENCOUNTER — Ambulatory Visit (INDEPENDENT_AMBULATORY_CARE_PROVIDER_SITE_OTHER): Payer: Medicaid Other | Admitting: Sports Medicine

## 2016-08-07 VITALS — BP 144/76 | HR 59 | Ht 74.0 in | Wt 211.0 lb

## 2016-08-07 DIAGNOSIS — M25562 Pain in left knee: Secondary | ICD-10-CM

## 2016-08-07 MED FILL — HYDROCODON-APAP 5-325: 5-325 | 4 days supply | Qty: 15 | Fill #0

## 2016-08-07 MED FILL — CHLORHEXIDINE 0.12% RINSE: 0.12 | 30 days supply | Qty: 473 | Fill #0

## 2016-08-07 MED FILL — ?IBUPROFEN 200 MG TABLET: 200 | 5 days supply | Qty: 60 | Fill #0

## 2016-08-07 NOTE — Telephone Encounter (Signed)
Patient can alternate tylenol and ibuprofen for pain Or he can take once daily mobic and OTC tylenol 650-100 mg TID
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
## 2016-08-08 NOTE — Telephone Encounter (Signed)
Pt was called on 8/25, pt states that he went to his dentist for medication for tooth ache

## 2016-08-14 ENCOUNTER — Other Ambulatory Visit: Payer: Medicaid Other

## 2016-08-15 ENCOUNTER — Ambulatory Visit (HOSPITAL_BASED_OUTPATIENT_CLINIC_OR_DEPARTMENT_OTHER): Payer: Medicaid Other | Attending: Family Medicine | Admitting: Internal Medicine

## 2016-08-15 VITALS — Ht 74.0 in | Wt 211.0 lb

## 2016-08-15 DIAGNOSIS — J449 Chronic obstructive pulmonary disease, unspecified: Secondary | ICD-10-CM | POA: Diagnosis not present

## 2016-08-15 DIAGNOSIS — G4733 Obstructive sleep apnea (adult) (pediatric): Secondary | ICD-10-CM | POA: Diagnosis not present

## 2016-08-15 DIAGNOSIS — I493 Ventricular premature depolarization: Secondary | ICD-10-CM | POA: Insufficient documentation

## 2016-08-15 DIAGNOSIS — G4700 Insomnia, unspecified: Secondary | ICD-10-CM | POA: Insufficient documentation

## 2016-08-15 DIAGNOSIS — I10 Essential (primary) hypertension: Secondary | ICD-10-CM | POA: Diagnosis not present

## 2016-08-15 DIAGNOSIS — Z79899 Other long term (current) drug therapy: Secondary | ICD-10-CM | POA: Insufficient documentation

## 2016-08-15 DIAGNOSIS — R0683 Snoring: Secondary | ICD-10-CM | POA: Insufficient documentation

## 2016-08-15 DIAGNOSIS — R5383 Other fatigue: Secondary | ICD-10-CM | POA: Insufficient documentation

## 2016-08-15 DIAGNOSIS — G4719 Other hypersomnia: Secondary | ICD-10-CM | POA: Diagnosis not present

## 2016-08-19 ENCOUNTER — Encounter (HOSPITAL_COMMUNITY): Payer: Self-pay | Admitting: *Deleted

## 2016-08-19 ENCOUNTER — Telehealth: Payer: Self-pay | Admitting: Cardiology

## 2016-08-19 ENCOUNTER — Emergency Department (HOSPITAL_COMMUNITY)
Admission: EM | Admit: 2016-08-19 | Discharge: 2016-08-19 | Disposition: A | Discharge status: Home / Self Care | Payer: Medicaid Other | Attending: Emergency Medicine | Admitting: Emergency Medicine

## 2016-08-19 ENCOUNTER — Encounter: Payer: Medicaid Other | Admitting: *Deleted

## 2016-08-19 ENCOUNTER — Emergency Department (HOSPITAL_COMMUNITY): Payer: Medicaid Other

## 2016-08-19 DIAGNOSIS — F17210 Nicotine dependence, cigarettes, uncomplicated: Secondary | ICD-10-CM | POA: Diagnosis not present

## 2016-08-19 DIAGNOSIS — R0789 Other chest pain: Secondary | ICD-10-CM | POA: Diagnosis present

## 2016-08-19 DIAGNOSIS — R1030 Lower abdominal pain, unspecified: Secondary | ICD-10-CM | POA: Insufficient documentation

## 2016-08-19 DIAGNOSIS — R339 Retention of urine, unspecified: Secondary | ICD-10-CM | POA: Insufficient documentation

## 2016-08-19 DIAGNOSIS — R0602 Shortness of breath: Secondary | ICD-10-CM | POA: Diagnosis not present

## 2016-08-19 DIAGNOSIS — R079 Chest pain, unspecified: Secondary | ICD-10-CM

## 2016-08-19 DIAGNOSIS — J449 Chronic obstructive pulmonary disease, unspecified: Secondary | ICD-10-CM | POA: Insufficient documentation

## 2016-08-19 DIAGNOSIS — I10 Essential (primary) hypertension: Secondary | ICD-10-CM | POA: Insufficient documentation

## 2016-08-19 LAB — BASIC METABOLIC PANEL
ANION GAP: 8 (ref 5–15)
BUN: 14 mg/dL (ref 6–20)
CALCIUM: 9.3 mg/dL (ref 8.9–10.3)
CO2: 25 mmol/L (ref 22–32)
Chloride: 104 mmol/L (ref 101–111)
Creatinine, Ser: 0.78 mg/dL (ref 0.61–1.24)
Glucose, Bld: 104 mg/dL — ABNORMAL HIGH (ref 65–99)
Potassium: 4.4 mmol/L (ref 3.5–5.1)
Sodium: 137 mmol/L (ref 135–145)

## 2016-08-19 LAB — URINALYSIS, ROUTINE W REFLEX MICROSCOPIC
Bilirubin Urine: NEGATIVE
GLUCOSE, UA: NEGATIVE mg/dL
Hgb urine dipstick: NEGATIVE
KETONES UR: NEGATIVE mg/dL
LEUKOCYTES UA: NEGATIVE
NITRITE: NEGATIVE
PROTEIN: NEGATIVE mg/dL
Specific Gravity, Urine: 1.02 (ref 1.005–1.030)
pH: 6 (ref 5.0–8.0)

## 2016-08-19 LAB — CBC
HCT: 43 % (ref 39.0–52.0)
HEMOGLOBIN: 14.3 g/dL (ref 13.0–17.0)
MCH: 27.4 pg (ref 26.0–34.0)
MCHC: 33.3 g/dL (ref 30.0–36.0)
MCV: 82.5 fL (ref 78.0–100.0)
Platelets: 170 10*3/uL (ref 150–400)
RBC: 5.21 MIL/uL (ref 4.22–5.81)
RDW: 14.5 % (ref 11.5–15.5)
WBC: 7.2 10*3/uL (ref 4.0–10.5)

## 2016-08-19 LAB — I-STAT TROPONIN, ED: TROPONIN I, POC: 0.01 ng/mL (ref 0.00–0.08)

## 2016-08-19 MED ORDER — PANTOPRAZOLE SODIUM 20 MG PO TBEC: 20.0000 mg | DELAYED_RELEASE_TABLET | Freq: Two times a day (BID) | ORAL | 2 refills | Status: DC

## 2016-08-19 MED ORDER — LIDOCAINE HCL 2 % EX GEL
1.0000 "application " | Freq: Once | CUTANEOUS | Status: AC
  Administered 2016-08-19: 1 via URETHRAL
  Filled 2016-08-19: qty 11

## 2016-08-19 NOTE — Discharge Instructions (Signed)
Follow-up with your cardiology folks. Recommend you restart the Protonix. Prescription provided. Use the leg bag as directed. May Limit to follow-up with urology for the urinary retention. Return for any new or worse symptoms.

## 2016-08-19 NOTE — ED Notes (Signed)
PT DISCHARGED. INSTRUCTIONS AND PRESCRIPTION GIVEN. AAOX4. PT IN NO APPARENT DISTRESS OR PAIN. THE OPPORTUNITY TO ASK QUESTIONS WAS PROVIDED. 
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
## 2016-08-19 NOTE — ED Notes (Signed)
Bladder Scanner: <545ML

## 2016-08-19 NOTE — ED Notes (Signed)
Patient transported to X-ray 

## 2016-08-19 NOTE — Telephone Encounter (Signed)
Spoke with pt and reminded pt of remote transmission that is due today. Pt verbalized understanding.   

## 2016-08-19 NOTE — ED Triage Notes (Signed)
Pt complains of groin pain and difficulty urinating for the past 3 days. Pt denies hematuria. Pt also complains of pain in epigastric area for the past 3 days, shortness of breath on exertion for the past week.

## 2016-08-20 MED FILL — PANTOPRAZOLE SOD DR 20 MG T: 20 | 30 days supply | Qty: 30 | Fill #0

## 2016-08-22 ENCOUNTER — Encounter: Payer: Self-pay | Admitting: Cardiology

## 2016-08-27 ENCOUNTER — Encounter (HOSPITAL_COMMUNITY): Payer: Self-pay

## 2016-08-27 ENCOUNTER — Inpatient Hospital Stay (HOSPITAL_COMMUNITY)
Admission: EM | Admit: 2016-08-27 | Discharge: 2016-08-31 | DRG: 065 | Disposition: A | Discharge status: Home / Self Care | Payer: Medicaid Other | Attending: Internal Medicine | Admitting: Internal Medicine

## 2016-08-27 ENCOUNTER — Emergency Department (HOSPITAL_COMMUNITY): Payer: Medicaid Other

## 2016-08-27 DIAGNOSIS — Z87891 Personal history of nicotine dependence: Secondary | ICD-10-CM

## 2016-08-27 DIAGNOSIS — R531 Weakness: Secondary | ICD-10-CM

## 2016-08-27 DIAGNOSIS — R200 Anesthesia of skin: Secondary | ICD-10-CM | POA: Diagnosis present

## 2016-08-27 DIAGNOSIS — I252 Old myocardial infarction: Secondary | ICD-10-CM

## 2016-08-27 DIAGNOSIS — E1169 Type 2 diabetes mellitus with other specified complication: Secondary | ICD-10-CM

## 2016-08-27 DIAGNOSIS — I6930 Unspecified sequelae of cerebral infarction: Secondary | ICD-10-CM

## 2016-08-27 DIAGNOSIS — Z8673 Personal history of transient ischemic attack (TIA), and cerebral infarction without residual deficits: Secondary | ICD-10-CM

## 2016-08-27 DIAGNOSIS — E669 Obesity, unspecified: Secondary | ICD-10-CM

## 2016-08-27 DIAGNOSIS — I2510 Atherosclerotic heart disease of native coronary artery without angina pectoris: Secondary | ICD-10-CM | POA: Diagnosis present

## 2016-08-27 DIAGNOSIS — R4701 Aphasia: Secondary | ICD-10-CM | POA: Diagnosis present

## 2016-08-27 DIAGNOSIS — E119 Type 2 diabetes mellitus without complications: Secondary | ICD-10-CM

## 2016-08-27 DIAGNOSIS — G459 Transient cerebral ischemic attack, unspecified: Secondary | ICD-10-CM

## 2016-08-27 DIAGNOSIS — F5104 Psychophysiologic insomnia: Secondary | ICD-10-CM | POA: Diagnosis present

## 2016-08-27 DIAGNOSIS — N400 Enlarged prostate without lower urinary tract symptoms: Secondary | ICD-10-CM | POA: Diagnosis present

## 2016-08-27 DIAGNOSIS — Z950 Presence of cardiac pacemaker: Secondary | ICD-10-CM | POA: Diagnosis present

## 2016-08-27 DIAGNOSIS — Z79899 Other long term (current) drug therapy: Secondary | ICD-10-CM

## 2016-08-27 DIAGNOSIS — Z880 Allergy status to penicillin: Secondary | ICD-10-CM

## 2016-08-27 DIAGNOSIS — Z23 Encounter for immunization: Secondary | ICD-10-CM

## 2016-08-27 DIAGNOSIS — Z801 Family history of malignant neoplasm of trachea, bronchus and lung: Secondary | ICD-10-CM

## 2016-08-27 DIAGNOSIS — N390 Urinary tract infection, site not specified: Secondary | ICD-10-CM | POA: Diagnosis present

## 2016-08-27 DIAGNOSIS — F17200 Nicotine dependence, unspecified, uncomplicated: Secondary | ICD-10-CM | POA: Diagnosis present

## 2016-08-27 DIAGNOSIS — I6330 Cerebral infarction due to thrombosis of unspecified cerebral artery: Principal | ICD-10-CM | POA: Insufficient documentation

## 2016-08-27 DIAGNOSIS — I639 Cerebral infarction, unspecified: Secondary | ICD-10-CM

## 2016-08-27 DIAGNOSIS — J449 Chronic obstructive pulmonary disease, unspecified: Secondary | ICD-10-CM | POA: Diagnosis present

## 2016-08-27 DIAGNOSIS — G250 Essential tremor: Secondary | ICD-10-CM | POA: Diagnosis present

## 2016-08-27 DIAGNOSIS — B9620 Unspecified Escherichia coli [E. coli] as the cause of diseases classified elsewhere: Secondary | ICD-10-CM | POA: Diagnosis present

## 2016-08-27 DIAGNOSIS — I495 Sick sinus syndrome: Secondary | ICD-10-CM | POA: Diagnosis present

## 2016-08-27 DIAGNOSIS — E785 Hyperlipidemia, unspecified: Secondary | ICD-10-CM | POA: Diagnosis present

## 2016-08-27 DIAGNOSIS — I10 Essential (primary) hypertension: Secondary | ICD-10-CM | POA: Diagnosis present

## 2016-08-27 HISTORY — DX: Weakness: R53.1

## 2016-08-27 HISTORY — DX: Unspecified sequelae of cerebral infarction: I69.30

## 2016-08-27 LAB — COMPREHENSIVE METABOLIC PANEL
ALBUMIN: 4.3 g/dL (ref 3.5–5.0)
ALT: 27 U/L (ref 17–63)
ANION GAP: 10 (ref 5–15)
AST: 17 U/L (ref 15–41)
Alkaline Phosphatase: 89 U/L (ref 38–126)
BILIRUBIN TOTAL: 0.8 mg/dL (ref 0.3–1.2)
BUN: 20 mg/dL (ref 6–20)
CHLORIDE: 103 mmol/L (ref 101–111)
CO2: 25 mmol/L (ref 22–32)
Calcium: 9.8 mg/dL (ref 8.9–10.3)
Creatinine, Ser: 0.96 mg/dL (ref 0.61–1.24)
GFR calc Af Amer: >60 mL/min (ref >60)
Glucose, Bld: 100 mg/dL — ABNORMAL HIGH (ref 65–99)
POTASSIUM: 4 mmol/L (ref 3.5–5.1)
Sodium: 138 mmol/L (ref 135–145)
TOTAL PROTEIN: 8.4 g/dL — AB (ref 6.5–8.1)

## 2016-08-27 LAB — DIFFERENTIAL
BASOS ABS: 0 10*3/uL (ref 0.0–0.1)
BASOS PCT: 0 %
EOS ABS: 0.1 10*3/uL (ref 0.0–0.7)
EOS PCT: 1 %
Lymphocytes Relative: 17 %
Lymphs Abs: 1.9 10*3/uL (ref 0.7–4.0)
Monocytes Absolute: 0.7 10*3/uL (ref 0.1–1.0)
Monocytes Relative: 6 %
NEUTROS PCT: 76 %
Neutro Abs: 8.7 10*3/uL — ABNORMAL HIGH (ref 1.7–7.7)

## 2016-08-27 LAB — I-STAT CHEM 8, ED
BUN: 21 mg/dL — AB (ref 6–20)
CALCIUM ION: 1.19 mmol/L (ref 1.15–1.40)
Chloride: 102 mmol/L (ref 101–111)
Creatinine, Ser: 0.9 mg/dL (ref 0.61–1.24)
Glucose, Bld: 98 mg/dL (ref 65–99)
HEMATOCRIT: 47 % (ref 39.0–52.0)
HEMOGLOBIN: 16 g/dL (ref 13.0–17.0)
Potassium: 4.1 mmol/L (ref 3.5–5.1)
SODIUM: 139 mmol/L (ref 135–145)
TCO2: 27 mmol/L (ref 0–100)

## 2016-08-27 LAB — CBC
HCT: 44.8 % (ref 39.0–52.0)
Hemoglobin: 14.6 g/dL (ref 13.0–17.0)
MCH: 27.3 pg (ref 26.0–34.0)
MCHC: 32.6 g/dL (ref 30.0–36.0)
MCV: 83.7 fL (ref 78.0–100.0)
Platelets: 207 10*3/uL (ref 150–400)
RBC: 5.35 MIL/uL (ref 4.22–5.81)
RDW: 14.3 % (ref 11.5–15.5)
WBC: 11.4 10*3/uL — ABNORMAL HIGH (ref 4.0–10.5)

## 2016-08-27 LAB — CBG MONITORING, ED: GLUCOSE-CAPILLARY: 104 mg/dL — AB (ref 65–99)

## 2016-08-27 LAB — APTT: APTT: 28 s (ref 24–36)

## 2016-08-27 LAB — PROTIME-INR
INR: 0.98
PROTHROMBIN TIME: 13 s (ref 11.4–15.2)

## 2016-08-27 LAB — I-STAT TROPONIN, ED: TROPONIN I, POC: 0 ng/mL (ref 0.00–0.08)

## 2016-08-27 NOTE — ED Triage Notes (Addendum)
Pt BIB wife. Pt complaining of facial numbness, confusion, lact of coordination, and slurred speech. Pt LKW was 0830. Pt denies vision changes. Pt A&Ox4 in triage. Pt frequently "staring off in to space" and takes extra time to answer questions. Increased lethargy. Foley catheter removed yesterday.
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
## 2016-08-28 ENCOUNTER — Encounter (HOSPITAL_COMMUNITY): Payer: Self-pay | Admitting: Family Medicine

## 2016-08-28 ENCOUNTER — Encounter (HOSPITAL_COMMUNITY): Payer: Medicaid Other

## 2016-08-28 ENCOUNTER — Observation Stay (HOSPITAL_COMMUNITY): Payer: Medicaid Other

## 2016-08-28 DIAGNOSIS — I10 Essential (primary) hypertension: Secondary | ICD-10-CM

## 2016-08-28 DIAGNOSIS — Z950 Presence of cardiac pacemaker: Secondary | ICD-10-CM | POA: Diagnosis not present

## 2016-08-28 DIAGNOSIS — F17200 Nicotine dependence, unspecified, uncomplicated: Secondary | ICD-10-CM

## 2016-08-28 DIAGNOSIS — R4701 Aphasia: Secondary | ICD-10-CM | POA: Diagnosis present

## 2016-08-28 DIAGNOSIS — R471 Dysarthria and anarthria: Secondary | ICD-10-CM

## 2016-08-28 DIAGNOSIS — I495 Sick sinus syndrome: Secondary | ICD-10-CM

## 2016-08-28 DIAGNOSIS — R200 Anesthesia of skin: Secondary | ICD-10-CM | POA: Diagnosis present

## 2016-08-28 DIAGNOSIS — J449 Chronic obstructive pulmonary disease, unspecified: Secondary | ICD-10-CM | POA: Diagnosis not present

## 2016-08-28 LAB — CBC
HEMATOCRIT: 43.2 % (ref 39.0–52.0)
HEMOGLOBIN: 13.7 g/dL (ref 13.0–17.0)
MCH: 26.9 pg (ref 26.0–34.0)
MCHC: 31.7 g/dL (ref 30.0–36.0)
MCV: 84.7 fL (ref 78.0–100.0)
Platelets: 184 10*3/uL (ref 150–400)
RBC: 5.1 MIL/uL (ref 4.22–5.81)
RDW: 14.1 % (ref 11.5–15.5)
WBC: 11.4 10*3/uL — ABNORMAL HIGH (ref 4.0–10.5)

## 2016-08-28 LAB — GLUCOSE, CAPILLARY: GLUCOSE-CAPILLARY: 169 mg/dL — AB (ref 65–99)

## 2016-08-28 MED ORDER — ACETAMINOPHEN 650 MG RE SUPP: 650.0000 mg | RECTAL | Status: DC | PRN

## 2016-08-28 MED ORDER — ASPIRIN 325 MG PO TABS
325.0000 mg | ORAL_TABLET | Freq: Every day | ORAL | Status: DC
  Administered 2016-08-28 – 2016-08-31 (×4): 325 mg via ORAL
  Filled 2016-08-28 (×4): qty 1

## 2016-08-28 MED ORDER — INFLUENZA VAC SPLIT QUAD 0.5 ML IM SUSY
0.5000 mL | PREFILLED_SYRINGE | INTRAMUSCULAR | Status: AC
  Administered 2016-08-29: 0.5 mL via INTRAMUSCULAR
  Filled 2016-08-28: qty 0.5

## 2016-08-28 MED ORDER — ATORVASTATIN CALCIUM 10 MG PO TABS
20.0000 mg | ORAL_TABLET | Freq: Every day | ORAL | Status: DC
  Administered 2016-08-28: 20 mg via ORAL
  Filled 2016-08-28 (×2): qty 2

## 2016-08-28 MED ORDER — PANTOPRAZOLE SODIUM 40 MG PO TBEC: 40.0000 mg | DELAYED_RELEASE_TABLET | ORAL | Status: DC

## 2016-08-28 MED ORDER — ONDANSETRON HCL 4 MG/2ML IJ SOLN
4.0000 mg | Freq: Once | INTRAMUSCULAR | Status: AC
Start: 2016-08-28 — End: 2016-08-28
  Administered 2016-08-28: 4 mg via INTRAVENOUS
  Filled 2016-08-28: qty 2

## 2016-08-28 MED ORDER — SERTRALINE HCL 100 MG PO TABS
100.0000 mg | ORAL_TABLET | Freq: Every day | ORAL | Status: DC
  Administered 2016-08-28 – 2016-08-31 (×4): 100 mg via ORAL
  Filled 2016-08-28 (×4): qty 1

## 2016-08-28 MED ORDER — MORPHINE SULFATE (PF) 4 MG/ML IV SOLN
4.0000 mg | INTRAVENOUS | Status: DC | PRN
Start: 2016-08-28 — End: 2016-08-28
  Administered 2016-08-28: 4 mg via INTRAVENOUS
  Filled 2016-08-28: qty 1

## 2016-08-28 MED ORDER — STROKE: EARLY STAGES OF RECOVERY BOOK
Freq: Once | Status: AC
  Administered 2016-08-28: 1
  Filled 2016-08-28: qty 1

## 2016-08-28 MED ORDER — PANTOPRAZOLE SODIUM 40 MG PO TBEC
40.0000 mg | DELAYED_RELEASE_TABLET | Freq: Every day | ORAL | Status: DC
  Administered 2016-08-28 – 2016-08-31 (×4): 40 mg via ORAL
  Filled 2016-08-28 (×4): qty 1

## 2016-08-28 MED ORDER — ENOXAPARIN SODIUM 40 MG/0.4ML ~~LOC~~ SOLN
40.0000 mg | SUBCUTANEOUS | Status: DC
  Administered 2016-08-28 – 2016-08-31 (×4): 40 mg via SUBCUTANEOUS
  Filled 2016-08-28 (×4): qty 0.4

## 2016-08-28 MED ORDER — ATORVASTATIN CALCIUM 10 MG PO TABS: 20.0000 mg | ORAL_TABLET | ORAL | Status: DC

## 2016-08-28 MED ORDER — TAMSULOSIN HCL 0.4 MG PO CAPS
0.8000 mg | ORAL_CAPSULE | Freq: Every day | ORAL | Status: DC
  Administered 2016-08-28 – 2016-08-31 (×4): 0.8 mg via ORAL
  Filled 2016-08-28 (×5): qty 2

## 2016-08-28 MED ORDER — ACETAMINOPHEN 325 MG PO TABS
650.0000 mg | ORAL_TABLET | ORAL | Status: DC | PRN
  Administered 2016-08-30: 650 mg via ORAL
  Filled 2016-08-28: qty 2

## 2016-08-28 MED ORDER — LISINOPRIL 20 MG PO TABS
40.0000 mg | ORAL_TABLET | Freq: Every day | ORAL | Status: DC
  Administered 2016-08-28 – 2016-08-31 (×4): 40 mg via ORAL
  Filled 2016-08-28 (×3): qty 2
  Filled 2016-08-28: qty 4

## 2016-08-28 MED ORDER — SENNOSIDES-DOCUSATE SODIUM 8.6-50 MG PO TABS
1.0000 | ORAL_TABLET | Freq: Every evening | ORAL | Status: DC | PRN
  Administered 2016-08-29 – 2016-08-31 (×2): 1 via ORAL
  Filled 2016-08-28 (×2): qty 1

## 2016-08-28 MED ORDER — SODIUM CHLORIDE 0.9 % IV BOLUS (SEPSIS)
500.0000 mL | Freq: Once | INTRAVENOUS | Status: AC
  Administered 2016-08-28: 500 mL via INTRAVENOUS

## 2016-08-28 MED ORDER — MELOXICAM 7.5 MG PO TABS
15.0000 mg | ORAL_TABLET | Freq: Every day | ORAL | Status: DC
  Administered 2016-08-28 – 2016-08-31 (×4): 15 mg via ORAL
  Filled 2016-08-28 (×4): qty 2

## 2016-08-28 MED ORDER — ASPIRIN 300 MG RE SUPP
300.0000 mg | Freq: Every day | RECTAL | Status: DC
  Filled 2016-08-28: qty 1

## 2016-08-28 NOTE — ED Notes (Signed)
Phone report called to charge nurse at [facility] ED.

## 2016-08-28 NOTE — ED Notes (Signed)
Transport arranged with [employer]
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
## 2016-08-28 NOTE — Evaluation (Signed)
Physical Therapy Evaluation Patient Details Name: Wesley [name] MRN: [id number] DOB: 1956-09-12 Today's Date: 08/28/2016   History of Present Illness  Patient is a 60 y/o male with hx of COPD, pacemaker, depression, TB, MI, HTN, HLD presents with left sided numbness, malaise, global weakness. Head CT- negative. Workup pending. Of note, pt with mid low back pain and dysuria.   Clinical Impression  Patient presents with left sided weakness/numbness, mild incoordination LUE, generalized fatigue and impaired mobility s/p above. Tolerated gait training with Min guard assist for safety due to left knee instability and impaired sensation. Sp02 88% post ambulation on RA. Pt has support from wife at home. Will follow acutely to maximize independence and mobility prior to return home.     Follow Up Recommendations Outpatient PT;Supervision - Intermittent (pending improvement)    Equipment Recommendations  None recommended by PT    Recommendations for Other Services       Precautions / Restrictions Precautions Precautions: Fall Restrictions Weight Bearing Restrictions: No      Mobility  Bed Mobility Overal bed mobility: Needs Assistance Bed Mobility: Supine to Sit;Sit to Supine     Supine to sit: Modified independent (Device/Increase time);HOB elevated Sit to supine: Modified independent (Device/Increase time);HOB elevated   General bed mobility comments: Use of rail. + dizziness- resolved within 2 minutes. BP 122/88.  Transfers Overall transfer level: Needs assistance Equipment used: None Transfers: Sit to/from Stand Sit to Stand: Min guard         General transfer comment: Min guard to steady in standing.  Ambulation/Gait Ambulation/Gait assistance: Min guard Ambulation Distance (Feet): 200 Feet Assistive device: None Gait Pattern/deviations: Step-through pattern;Decreased stride length;Decreased stance time - left;Decreased weight shift to left Gait velocity:  decreased Gait velocity interpretation: Below normal speed for age/gender General Gait Details: Pt with difficulty progressing LLE at times towards end of ambulation; reports knee instability but no buckling noted. MIldly unsteady.  Stairs            Wheelchair Mobility    Modified Rankin (Stroke Patients Only) Modified Rankin (Stroke Patients Only) Pre-Morbid Rankin Score: No significant disability Modified Rankin: Moderate disability     Balance Overall balance assessment: Needs assistance Sitting-balance support: Feet supported;No upper extremity supported Sitting balance-Leahy Scale: Good Sitting balance - Comments: Able to donn sock sitting EOB reaching outside BoS without difficulty only using RUE.   Standing balance support: During functional activity Standing balance-Leahy Scale: Fair                               Pertinent Vitals/Pain Pain Assessment: No/denies pain    Home Living Family/patient expects to be discharged to:: Private residence Living Arrangements: Spouse/significant other Available Help at Discharge: Family Type of Home: House Home Access: Stairs to enter Entrance Stairs-Rails: Right Entrance Stairs-Number of Steps: 3 Home Layout: One level Home Equipment: Cane - single point      Prior Function Level of Independence: Independent         Comments: Drives. Walks 2 miles every couple days.      Hand Dominance   Dominant Hand: Right    Extremity/Trunk Assessment   Upper Extremity Assessment: Defer to OT evaluation (numbness and decreased grip LUE, mild incoordination noted with finger to thumb testing.)           Lower Extremity Assessment: LLE deficits/detail   LLE Deficits / Details: Grossly ~4/5 throughout, except 3+/5 hip flexion.  Communication   Communication: No difficulties  Cognition Arousal/Alertness: Awake/alert Behavior During Therapy: WFL for tasks assessed/performed Overall Cognitive  Status:  (appears WFL, but minimal verbalizations as pt tired.)                      General Comments General comments (skin integrity, edema, etc.): Spouse present inr oom sleeping.    Exercises        Assessment/Plan    PT Assessment Patient needs continued PT services  PT Diagnosis Difficulty walking;Generalized weakness   PT Problem List Decreased strength;Decreased mobility;Decreased balance;Impaired sensation;Decreased activity tolerance;Cardiopulmonary status limiting activity;Decreased coordination  PT Treatment Interventions Therapeutic activities;Gait training;Therapeutic exercise;Patient/family education;Balance training;Stair training;Neuromuscular re-education;Functional mobility training   PT Goals (Current goals can be found in the Care Plan section) Acute Rehab PT Goals Patient Stated Goal: to feel better PT Goal Formulation: With patient Time For Goal Achievement: 09/11/16 Potential to Achieve Goals: Good    Frequency Min 4X/week   Barriers to discharge Inaccessible home environment stairs to enter home    Co-evaluation               End of Session Equipment Utilized During Treatment: Gait belt Activity Tolerance: Patient tolerated treatment well;Patient limited by fatigue Patient left: in bed;with call bell/phone within reach;with bed alarm set;with family/visitor present Nurse Communication: Mobility status    Functional Assessment Tool Used: clinical judgment Functional Limitation: Mobility: Walking and moving around Mobility: Walking and Moving Around Current Status [id number]): At least 1 percent but less than 20 percent impaired, limited or restricted Mobility: Walking and Moving Around Goal Status [phone]): At least 1 percent but less than 20 percent impaired, limited or restricted    Time: 0740-0758 PT Time Calculation (min) (ACUTE ONLY): 18 min   Charges:   PT Evaluation $PT Eval Moderate Complexity: 1 Procedure     PT G Codes:    PT G-Codes **NOT FOR INPATIENT CLASS** Functional Assessment Tool Used: clinical judgment Functional Limitation: Mobility: Walking and moving around Mobility: Walking and Moving Around Current Status [id number]): At least 1 percent but less than 20 percent impaired, limited or restricted Mobility: Walking and Moving Around Goal Status [phone]): At least 1 percent but less than 20 percent impaired, limited or restricted    [place] 08/28/2016, 8:05 AM Wray Kearns, PT, DPT [phone]

## 2016-08-28 NOTE — ED Notes (Signed)
Patient transported to X-ray 

## 2016-08-28 NOTE — ED Provider Notes (Signed)
60 year old male transferred from was a [facility] emergency department for MRI scan. He had presented with headache and a aphasia. Headache and aphasia have since resolved. His neurologic exam is nonfocal. MRI is ordered.  MRI cannot be done immediately because of pacemaker. It will be done during the day shift. He will need additional TIA workup, so decision is made to admit him. Case is discussed with Dr. Loleta Books of [facility] who agrees to admit the patient.   Delora Fuel, MD [phone]

## 2016-08-28 NOTE — Care Management Obs Status (Signed)
[facility] NOTIFICATION   Patient Details  Name: Wesley [name] MRN: [id number] Date of Birth: 05-May-1956   Medicare Observation Status Notification Given:  Yes    Dawayne Patricia, RN 08/28/2016, 4:44 PM
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
## 2016-08-28 NOTE — Progress Notes (Signed)
PROGRESS NOTE  Wesley [name]  [id number] DOB: 11/29/1956 DOA: 08/27/2016 PCP: Minerva Ends, MD Outpatient Specialists:  Subjective: Seen with his wife at bedside, denies any complaints.  Brief Narrative:  Wesley [name] is a 60 y.o. male with a past medical history significant for HTN, smoking, SSS with pacer, and nonobstructive CAD who presents with weakness.  The patient was in his usual state of health until tonight, some timebefore 7:00 when the patient's wife got home from work, and she noticed him to be "very short of breath and kind of distressed" when he came back from a walk.  Shortly after that, she found him in the living room lying on the couch, again appearing very tired.  He spoke to her, "but it was slurred" and then he said he "just didn't feel good" and had staring spells (although still responsive during them).  He reports that he had been lightheaded and dizzy all day, and that by evening, his "whole body was weak", he had numbness on his left side arm and leg, and felt hot and cold flashes, so his wife brought him to the [facility] ER.   Assessment & Plan:   Principal Problem:   Left sided numbness Active Problems:   Cardiac pacemaker in situ   Essential hypertension   COPD mixed type (HCC)   Tobacco use disorder   SSS (sick sinus syndrome) ([place])   Aphasia  This is a no charge note, patient seen earlier today by my colleague Dr. Eudelia Bunch. FC the patient, examined him and reviewed the whole chart. Left-sided numbness, rule out CVA/TIA.  1. Suspected acute Stroke vs TIA:  -Presented with left-sided numbness including the arm and the leg as well as word finding difficulty. -Neurology to evaluate. -Admit to telemetry -Neuro checks, NIHSS per protocol -Daily aspirin 325 mg ordered -Lipids, hemoglobin A1c -MRI brain and MRA was not done because of pacemaker not compatible with MRI -Carotid doppler ordered -Echocardiogram ordered -PT  consultation -Consult to Neurology, appreciate recommendations  2. HTN:  -Continue lisinopril -Continue statin  3. BPH:  -Continue home tamsulosin  4. Other medications:  -Continue sertraline -Continue Mobic -Continue PPI   DVT prophylaxis:  Code Status: Full Code Family Communication:  Disposition Plan:  Diet: Diet Heart Room service appropriate? Yes; Fluid consistency: Thin  Consultants:   Neuro  Procedures:   None  Antimicrobials:   None   Objective: Vitals:   08/28/16 0415 08/28/16 0417 08/28/16 0515 08/28/16 0600  BP: 125/83 125/83 117/72 112/68  Pulse: 82 75 69 60  Resp: 18 20 16 20   Temp:    99.6 F (37.6 C)  TempSrc:    Oral  SpO2: 93% 92% 94% 97%  Weight:  95.7 kg (211 lb)  98.1 kg (216 lb 4.3 oz)  Height:  5\' 11"  (1.803 m)  5\' 11"  (1.803 m)    Intake/Output Summary (Last 24 hours) at 08/28/16 1204 Last data filed at 08/28/16 0321  Gross per 24 hour  Intake                0 ml  Output              500 ml  Net             -500 ml   Filed Weights   08/27/16 2045 08/28/16 0417 08/28/16 0600  Weight: 95.7 kg (211 lb) 95.7 kg (211 lb) 98.1 kg (216 lb 4.3 oz)    Examination: General exam: Appears calm  and comfortable  Respiratory system: Clear to auscultation. Respiratory effort normal. Cardiovascular system: S1 & S2 heard, RRR. No JVD, murmurs, rubs, gallops or clicks. No pedal edema. Gastrointestinal system: Abdomen is nondistended, soft and nontender. No organomegaly or masses felt. Normal bowel sounds heard. Central nervous system: Alert and oriented. No focal neurological deficits. Extremities: Symmetric 5 x 5 power. Skin: No rashes, lesions or ulcers Psychiatry: Judgement and insight appear normal. Mood & affect appropriate.   Data Reviewed: I have personally reviewed following labs and imaging studies  CBC:  Recent Labs Lab 08/27/16 2054 08/27/16 2114 08/28/16 0703  WBC 11.4*  --  11.4*  NEUTROABS 8.7*  --   --   HGB 14.6  16.0 13.7  HCT 44.8 47.0 43.2  MCV 83.7  --  84.7  PLT 207  --  [id number]   Basic Metabolic Panel:  Recent Labs Lab 08/27/16 2054 08/27/16 2114  NA 138 139  K 4.0 4.1  CL 103 102  CO2 25  --   GLUCOSE 100* 98  BUN 20 21*  CREATININE 0.96 0.90  CALCIUM 9.8  --    GFR: Estimated Creatinine Clearance: 104.2 mL/min (by C-G formula based on SCr of 0.9 mg/dL). Liver Function Tests:  Recent Labs Lab 08/27/16 2054  AST 17  ALT 27  ALKPHOS 89  BILITOT 0.8  PROT 8.4*  ALBUMIN 4.3   No results for input(s): LIPASE, AMYLASE in the last 168 hours. No results for input(s): AMMONIA in the last 168 hours. Coagulation Profile:  Recent Labs Lab 08/27/16 2054  INR 0.98   Cardiac Enzymes: No results for input(s): CKTOTAL, CKMB, CKMBINDEX, TROPONINI in the last 168 hours. BNP (last 3 results) No results for input(s): PROBNP in the last 8760 hours. HbA1C: No results for input(s): HGBA1C in the last 72 hours. CBG:  Recent Labs Lab 08/27/16 2050  GLUCAP 104*   Lipid Profile: No results for input(s): CHOL, HDL, LDLCALC, TRIG, CHOLHDL, LDLDIRECT in the last 72 hours. Thyroid Function Tests: No results for input(s): TSH, T4TOTAL, FREET4, T3FREE, THYROIDAB in the last 72 hours. Anemia Panel: No results for input(s): VITAMINB12, FOLATE, FERRITIN, TIBC, IRON, RETICCTPCT in the last 72 hours. Urine analysis:    Component Value Date/Time   COLORURINE YELLOW 08/19/2016 [place] 08/19/2016 1449   LABSPEC 1.020 08/19/2016 1449   PHURINE 6.0 08/19/2016 1449   GLUCOSEU NEGATIVE 08/19/2016 1449   HGBUR NEGATIVE 08/19/2016 1449   BILIRUBINUR NEGATIVE 08/19/2016 1449   BILIRUBINUR Negative 07/22/2016 1142   KETONESUR NEGATIVE 08/19/2016 1449   PROTEINUR NEGATIVE 08/19/2016 1449   UROBILINOGEN 0.2 07/22/2016 1142   NITRITE NEGATIVE 08/19/2016 1449   LEUKOCYTESUR NEGATIVE 08/19/2016 1449   Sepsis Labs: @LABRCNTIP (procalcitonin:4,lacticidven:4)  )No results found for  this or any previous visit (from the past 240 hour(s)).   Invalid input(s): PROCALCITONIN, LACTICACIDVEN   Radiology Studies: Dg Chest 2 View  Result Date: 08/28/2016 CLINICAL DATA:  Dyspnea and weakness today EXAM: CHEST  2 VIEW COMPARISON:  08/19/2016 FINDINGS: BULLOUS EMPHYSEMATOUS DISEASE IS PRESENT, GREATEST IN THE RIGHT UPPER LOBE. MILD LINEAR SCARRING IN THE RIGHT LUNG IS STABLE. NO AIRSPACE CONSOLIDATION. NO EFFUSIONS. NORMAL PULMONARY VASCULATURE. HILAR, MEDIASTINAL AND CARDIAC CONTOURS ARE UNREMARKABLE AND UNCHANGED. INTACT APPEARANCES OF THE DUAL-CHAMBER TRANSVENOUS PACING LEADS. IMPRESSION: Severe bullous emphysematous disease. No acute cardiopulmonary findings. Electronically Signed   By: Andreas Newport M.D.   On: 08/28/2016 05:48   Ct Head Wo Contrast  Result Date: 08/27/2016 CLINICAL DATA:  Facial paresthesias.  Confusion. EXAM: CT HEAD WITHOUT CONTRAST TECHNIQUE: Contiguous axial images were obtained from the base of the skull through the vertex without intravenous contrast. COMPARISON:  04/05/2016 FINDINGS: Brain: There is no intracranial hemorrhage, mass or evidence of acute infarction. There is mild generalized atrophy. There is mild chronic microvascular ischemic change. There is no significant extra-axial fluid collection. No acute intracranial findings are evident. Remote infarction in the anterior left lentiform nuclei, probably also in the left pons. Vascular: No hyperdense vessel or unexpected calcification. Skull: Normal. Negative for fracture or focal lesion. Sinuses/Orbits: No acute finding. IMPRESSION: No acute intracranial findings. There is moderate generalized atrophy and chronic appearing white matter hypodensities which likely represent small vessel ischemic disease. Remote left-sided infarctions, lentiform nuclei and pons. Electronically Signed   By: Andreas Newport M.D.   On: 08/27/2016 21:30        Scheduled Meds: . aspirin  300 mg Rectal Daily   Or  .  aspirin  325 mg Oral Daily  . atorvastatin  20 mg Oral Daily  . enoxaparin (LOVENOX) injection  40 mg Subcutaneous Q24H  . [START ON 08/29/2016] Influenza vac split quadrivalent PF  0.5 mL Intramuscular Tomorrow-1000  . lisinopril  40 mg Oral Daily  . meloxicam  15 mg Oral Q breakfast  . pantoprazole  40 mg Oral Daily  . sertraline  100 mg Oral Daily  . tamsulosin  0.8 mg Oral QPC supper   Continuous Infusions:    LOS: 0 days    Time spent: 35 minutes    Santo Zahradnik A, MD [facility] Pager [phone]  If 7PM-7AM, please contact night-coverage [email or web address] Password [facility] 08/28/2016, 12:04 PM

## 2016-08-28 NOTE — ED Notes (Addendum)
Pt A&O x 4. Family at the bedside. Initial NIH 3. No neuro deficits noted at this time. Pt passed swallow screen. Ambulatory with assistance. Last neuro check completed at 0415.   [place], [place]

## 2016-08-29 ENCOUNTER — Other Ambulatory Visit (HOSPITAL_COMMUNITY): Payer: Medicaid Other

## 2016-08-29 ENCOUNTER — Encounter (HOSPITAL_COMMUNITY): Payer: Self-pay | Admitting: *Deleted

## 2016-08-29 ENCOUNTER — Observation Stay (HOSPITAL_COMMUNITY): Payer: Medicaid Other

## 2016-08-29 ENCOUNTER — Encounter (HOSPITAL_COMMUNITY): Payer: Medicaid Other

## 2016-08-29 DIAGNOSIS — E119 Type 2 diabetes mellitus without complications: Secondary | ICD-10-CM | POA: Diagnosis present

## 2016-08-29 DIAGNOSIS — R200 Anesthesia of skin: Secondary | ICD-10-CM | POA: Diagnosis present

## 2016-08-29 DIAGNOSIS — N390 Urinary tract infection, site not specified: Secondary | ICD-10-CM | POA: Diagnosis present

## 2016-08-29 DIAGNOSIS — N400 Enlarged prostate without lower urinary tract symptoms: Secondary | ICD-10-CM | POA: Diagnosis present

## 2016-08-29 DIAGNOSIS — R4701 Aphasia: Secondary | ICD-10-CM

## 2016-08-29 DIAGNOSIS — J449 Chronic obstructive pulmonary disease, unspecified: Secondary | ICD-10-CM | POA: Diagnosis present

## 2016-08-29 DIAGNOSIS — I6330 Cerebral infarction due to thrombosis of unspecified cerebral artery: Secondary | ICD-10-CM | POA: Insufficient documentation

## 2016-08-29 DIAGNOSIS — G459 Transient cerebral ischemic attack, unspecified: Secondary | ICD-10-CM | POA: Diagnosis not present

## 2016-08-29 DIAGNOSIS — Z8673 Personal history of transient ischemic attack (TIA), and cerebral infarction without residual deficits: Secondary | ICD-10-CM

## 2016-08-29 DIAGNOSIS — B9620 Unspecified Escherichia coli [E. coli] as the cause of diseases classified elsewhere: Secondary | ICD-10-CM | POA: Diagnosis present

## 2016-08-29 DIAGNOSIS — Z87891 Personal history of nicotine dependence: Secondary | ICD-10-CM | POA: Diagnosis not present

## 2016-08-29 DIAGNOSIS — I252 Old myocardial infarction: Secondary | ICD-10-CM | POA: Diagnosis not present

## 2016-08-29 DIAGNOSIS — F5104 Psychophysiologic insomnia: Secondary | ICD-10-CM | POA: Diagnosis present

## 2016-08-29 DIAGNOSIS — Z801 Family history of malignant neoplasm of trachea, bronchus and lung: Secondary | ICD-10-CM | POA: Diagnosis not present

## 2016-08-29 DIAGNOSIS — E785 Hyperlipidemia, unspecified: Secondary | ICD-10-CM | POA: Diagnosis present

## 2016-08-29 DIAGNOSIS — I10 Essential (primary) hypertension: Secondary | ICD-10-CM | POA: Diagnosis present

## 2016-08-29 DIAGNOSIS — G250 Essential tremor: Secondary | ICD-10-CM | POA: Diagnosis present

## 2016-08-29 DIAGNOSIS — Z79899 Other long term (current) drug therapy: Secondary | ICD-10-CM | POA: Diagnosis not present

## 2016-08-29 DIAGNOSIS — Z950 Presence of cardiac pacemaker: Secondary | ICD-10-CM | POA: Diagnosis not present

## 2016-08-29 DIAGNOSIS — Z880 Allergy status to penicillin: Secondary | ICD-10-CM | POA: Diagnosis not present

## 2016-08-29 DIAGNOSIS — I2510 Atherosclerotic heart disease of native coronary artery without angina pectoris: Secondary | ICD-10-CM | POA: Diagnosis present

## 2016-08-29 DIAGNOSIS — R0683 Snoring: Secondary | ICD-10-CM | POA: Diagnosis not present

## 2016-08-29 DIAGNOSIS — Z23 Encounter for immunization: Secondary | ICD-10-CM | POA: Diagnosis not present

## 2016-08-29 LAB — LIPID PANEL
CHOL/HDL RATIO: 3.9 ratio
Cholesterol: 134 mg/dL (ref 0–200)
HDL: 34 mg/dL — AB (ref >40)
LDL CALC: 89 mg/dL (ref 0–99)
TRIGLYCERIDES: 53 mg/dL (ref <150)
VLDL: 11 mg/dL (ref 0–40)

## 2016-08-29 LAB — BASIC METABOLIC PANEL
Anion gap: 6 (ref 5–15)
BUN: 18 mg/dL (ref 6–20)
CALCIUM: 9.2 mg/dL (ref 8.9–10.3)
CO2: 27 mmol/L (ref 22–32)
Chloride: 103 mmol/L (ref 101–111)
Creatinine, Ser: 0.84 mg/dL (ref 0.61–1.24)
GFR calc Af Amer: >60 mL/min (ref >60)
GLUCOSE: 119 mg/dL — AB (ref 65–99)
Potassium: 4.2 mmol/L (ref 3.5–5.1)
Sodium: 136 mmol/L (ref 135–145)

## 2016-08-29 LAB — URINALYSIS, ROUTINE W REFLEX MICROSCOPIC
BILIRUBIN URINE: NEGATIVE
GLUCOSE, UA: NEGATIVE mg/dL
Ketones, ur: NEGATIVE mg/dL
Nitrite: POSITIVE — AB
PROTEIN: NEGATIVE mg/dL
Specific Gravity, Urine: 1.019 (ref 1.005–1.030)
pH: 5.5 (ref 5.0–8.0)

## 2016-08-29 LAB — RAPID URINE DRUG SCREEN, HOSP PERFORMED
AMPHETAMINES: NOT DETECTED
BARBITURATES: NOT DETECTED
BENZODIAZEPINES: NOT DETECTED
COCAINE: NOT DETECTED
Opiates: POSITIVE — AB
Tetrahydrocannabinol: NOT DETECTED

## 2016-08-29 LAB — GLUCOSE, CAPILLARY: GLUCOSE-CAPILLARY: 118 mg/dL — AB (ref 65–99)

## 2016-08-29 LAB — URINE MICROSCOPIC-ADD ON

## 2016-08-29 MED ORDER — IOPAMIDOL (ISOVUE-370) INJECTION 76%
INTRAVENOUS | Status: AC
  Administered 2016-08-29: 50 mL
  Filled 2016-08-29: qty 50

## 2016-08-29 MED ORDER — ATORVASTATIN CALCIUM 40 MG PO TABS
40.0000 mg | ORAL_TABLET | Freq: Every day | ORAL | Status: DC
  Administered 2016-08-29 – 2016-08-31 (×3): 40 mg via ORAL
  Filled 2016-08-29 (×3): qty 1

## 2016-08-29 MED ORDER — DEXTROSE 5 % IV SOLN
1.0000 g | INTRAVENOUS | Status: DC
  Administered 2016-08-29 – 2016-08-31 (×3): 1 g via INTRAVENOUS
  Filled 2016-08-29 (×4): qty 10

## 2016-08-29 MED ORDER — FLUTICASONE PROPIONATE 50 MCG/ACT NA SUSP
1.0000 | Freq: Every day | NASAL | Status: DC
  Administered 2016-08-29 – 2016-08-31 (×3): 1 via NASAL
  Filled 2016-08-29 (×2): qty 16

## 2016-08-29 MED ORDER — BISACODYL 5 MG PO TBEC
5.0000 mg | DELAYED_RELEASE_TABLET | Freq: Every day | ORAL | Status: DC | PRN
  Administered 2016-08-29 – 2016-08-31 (×2): 5 mg via ORAL
  Filled 2016-08-29 (×2): qty 1

## 2016-08-29 NOTE — Progress Notes (Signed)
Physical Therapy Treatment Patient Details Name: Wesley [name] MRN: [id number] DOB: 10/06/1956 Today's Date: 08/29/2016    History of Present Illness Patient is a 60 y/o male with hx of COPD, pacemaker, depression, TB, MI, HTN, HLD presents with left sided numbness, malaise, global weakness. Head CT- negative. Workup pending. Of note, pt with mid low back pain and dysuria.     PT Comments    Patient progressing well towards PT goals. Tolerated stair training with supervision for safety. Continues to report weakness LLE but reports improved sensation with only tingling present on left side. Having some mild difficulty with word finding. Will continue to follow for higher level balance so pt can return to PLOF.   Follow Up Recommendations  Outpatient PT;Supervision - Intermittent     Equipment Recommendations  None recommended by PT    Recommendations for Other Services       Precautions / Restrictions Precautions Precautions: Fall Restrictions Weight Bearing Restrictions: No    Mobility  Bed Mobility Overal bed mobility: Needs Assistance Bed Mobility: Supine to Sit;Sit to Supine     Supine to sit: Modified independent (Device/Increase time);HOB elevated Sit to supine: Modified independent (Device/Increase time);HOB elevated   General bed mobility comments: Use of rail. no assist needed. No dizziness.  Transfers Overall transfer level: Needs assistance Equipment used: None Transfers: Sit to/from Stand Sit to Stand: Modified independent (Device/Increase time)         General transfer comment: Stood fromE OB without difficulty or assist. no dizziness.  Ambulation/Gait Ambulation/Gait assistance: Min guard Ambulation Distance (Feet): 250 Feet Assistive device: None Gait Pattern/deviations: Step-through pattern;Decreased stride length;Decreased weight shift to left;Drifts right/left Gait velocity: decreased Gait velocity interpretation: Below normal speed for  age/gender General Gait Details: Left knee instability noted mildly during gait training as well as DOE. Sp02 94% on RA. 1 seated rest break due to fatigue. Unsteady requiring close min guard,   Stairs Stairs: Yes Stairs assistance: Supervision Stair Management: Alternating pattern;Two rails Number of Stairs: 3 (+ 2 steps x4 bouts) General stair comments: Cues for safety/technique.  Wheelchair Mobility    Modified Rankin (Stroke Patients Only) Modified Rankin (Stroke Patients Only) Pre-Morbid Rankin Score: No significant disability Modified Rankin: Moderate disability     Balance Overall balance assessment: Needs assistance Sitting-balance support: Feet supported;No upper extremity supported Sitting balance-Leahy Scale: Normal     Standing balance support: During functional activity Standing balance-Leahy Scale: Fair                      Cognition Arousal/Alertness: Awake/alert Behavior During Therapy: WFL for tasks assessed/performed Overall Cognitive Status: Within Functional Limits for tasks assessed                      Exercises      General Comments        Pertinent Vitals/Pain Pain Assessment: No/denies pain    Home Living                      Prior Function            PT Goals (current goals can now be found in the care plan section) Progress towards PT goals: Progressing toward goals    Frequency    Min 4X/week      PT Plan Current plan remains appropriate    Co-evaluation             End of Session Equipment Utilized During  Treatment: Gait belt Activity Tolerance: Patient tolerated treatment well Patient left: in bed;with call bell/phone within reach;with bed alarm set     Time: [id number] PT Time Calculation (min) (ACUTE ONLY): 16 min  Charges:  $Gait Training: 8-22 mins                    G Codes:      Garrett Mitchum A Seila Liston 08/29/2016, 3:05 PM Wray Kearns, [place], DPT [phone]

## 2016-08-29 NOTE — Progress Notes (Signed)
PROGRESS NOTE  Wesley [name]  [id number] DOB: [birth year] DOA: 08/27/2016 PCP: Minerva Ends, MD Outpatient Specialists:  Subjective: His wife was giving him on that, he feels much better, still has very slight tingling on the left side. Started on Rocephin for UTI.  Brief Narrative:  Wesley [name] is a 60 y.o. male with a past medical history significant for HTN, smoking, SSS with pacer, and nonobstructive CAD who presents with weakness.  The patient was in his usual state of health until tonight, some timebefore 7:00 when the patient's wife got home from work, and she noticed him to be "very short of breath and kind of distressed" when he came back from a walk.  Shortly after that, she found him in the living room lying on the couch, again appearing very tired.  He spoke to her, "but it was slurred" and then he said he "just didn't feel good" and had staring spells (although still responsive during them).  He reports that he had been lightheaded and dizzy all day, and that by evening, his "whole body was weak", he had numbness on his left side arm and leg, and felt hot and cold flashes, so his wife brought him to the [facility] ER.   Assessment & Plan:   Principal Problem:   Left sided numbness Active Problems:   Cardiac pacemaker in situ   Essential hypertension   COPD mixed type (HCC)   Tobacco use disorder   SSS (sick sinus syndrome) (HCC)   Aphasia   Suspected acute Stroke vs TIA:  -Presented with left-sided numbness including the arm and the leg as well as word finding difficulty. -Neurology to evaluate. -Admit to telemetry -Neuro checks, NIHSS per protocol -Daily aspirin 325 mg ordered -Lipids, hemoglobin A1c is 6.7 on 07/22/2016 -MRI brain and MRA was not done because of pacemaker not compatible with MRI -Carotid doppler ordered -Echocardiogram ordered -PT consultation -Await stroke team recommendation.  UTI -Urinalysis consistent with UTI, started on  Rocephin. -Urine culture sent, follow urine culture.  HTN:  -Continue lisinopril -Continue statin  BPH:  -Continue home tamsulosin  Other medications:  -Continue sertraline -Continue Mobic -Continue PPI   DVT prophylaxis: Lovenox Code Status: Full Code Family Communication:  Disposition Plan:  Diet: Diet Heart Room service appropriate? Yes; Fluid consistency: Thin  Consultants:   Neuro  Procedures:   None  Antimicrobials:   None   Objective: Vitals:   08/28/16 2104 08/29/16 0040 08/29/16 0522 08/29/16 0944  BP: 113/68 104/60 118/71 131/69  Pulse: 61 64 60 60  Resp: 16 18 16 18   Temp: 98.2 F (36.8 C) 98.1 F (36.7 C) 98.8 F (37.1 C) 99.1 F (37.3 C)  TempSrc: Oral Oral Oral Oral  SpO2: 97% 98% 98% 99%  Weight:      Height:        Intake/Output Summary (Last 24 hours) at 08/29/16 1116 Last data filed at 08/29/16 0944  Gross per 24 hour  Intake                0 ml  Output              900 ml  Net             -900 ml   Filed Weights   08/27/16 2045 08/28/16 0417 08/28/16 0600  Weight: 95.7 kg (211 lb) 95.7 kg (211 lb) 98.1 kg (216 lb 4.3 oz)    Examination: General exam: Appears calm and comfortable  Respiratory system:  Clear to auscultation. Respiratory effort normal. Cardiovascular system: S1 & S2 heard, RRR. No JVD, murmurs, rubs, gallops or clicks. No pedal edema. Gastrointestinal system: Abdomen is nondistended, soft and nontender. No organomegaly or masses felt. Normal bowel sounds heard. Central nervous system: Alert and oriented. No focal neurological deficits. Extremities: Symmetric 5 x 5 power. Skin: No rashes, lesions or ulcers Psychiatry: Judgement and insight appear normal. Mood & affect appropriate.   Data Reviewed: I have personally reviewed following labs and imaging studies  CBC:  Recent Labs Lab 08/27/16 2054 08/27/16 2114 08/28/16 0703  WBC 11.4*  --  11.4*  NEUTROABS 8.7*  --   --   HGB 14.6 16.0 13.7  HCT 44.8  47.0 43.2  MCV 83.7  --  84.7  PLT 207  --  [id number]   Basic Metabolic Panel:  Recent Labs Lab 08/27/16 2054 08/27/16 2114 08/29/16 0759  NA 138 139 136  K 4.0 4.1 4.2  CL 103 102 103  CO2 25  --  27  GLUCOSE 100* 98 119*  BUN 20 21* 18  CREATININE 0.96 0.90 0.84  CALCIUM 9.8  --  9.2   GFR: Estimated Creatinine Clearance: 111.6 mL/min (by C-G formula based on SCr of 0.84 mg/dL). Liver Function Tests:  Recent Labs Lab 08/27/16 2054  AST 17  ALT 27  ALKPHOS 89  BILITOT 0.8  PROT 8.4*  ALBUMIN 4.3   No results for input(s): LIPASE, AMYLASE in the last 168 hours. No results for input(s): AMMONIA in the last 168 hours. Coagulation Profile:  Recent Labs Lab 08/27/16 2054  INR 0.98   Cardiac Enzymes: No results for input(s): CKTOTAL, CKMB, CKMBINDEX, TROPONINI in the last 168 hours. BNP (last 3 results) No results for input(s): PROBNP in the last 8760 hours. HbA1C: No results for input(s): HGBA1C in the last 72 hours. CBG:  Recent Labs Lab 08/27/16 2050 08/28/16 2119  GLUCAP 104* 169*   Lipid Profile:  Recent Labs  08/29/16 0759  CHOL 134  HDL 34*  LDLCALC 89  TRIG 53  CHOLHDL 3.9   Thyroid Function Tests: No results for input(s): TSH, T4TOTAL, FREET4, T3FREE, THYROIDAB in the last 72 hours. Anemia Panel: No results for input(s): VITAMINB12, FOLATE, FERRITIN, TIBC, IRON, RETICCTPCT in the last 72 hours. Urine analysis:    Component Value Date/Time   COLORURINE YELLOW 08/29/2016 0252   APPEARANCEUR CLOUDY (A) 08/29/2016 0252   LABSPEC 1.019 08/29/2016 0252   PHURINE 5.5 08/29/2016 0252   GLUCOSEU NEGATIVE 08/29/2016 0252   HGBUR SMALL (A) 08/29/2016 0252   BILIRUBINUR NEGATIVE 08/29/2016 0252   BILIRUBINUR Negative 07/22/2016 1142   KETONESUR NEGATIVE 08/29/2016 0252   PROTEINUR NEGATIVE 08/29/2016 0252   UROBILINOGEN 0.2 07/22/2016 1142   NITRITE POSITIVE (A) 08/29/2016 0252   LEUKOCYTESUR LARGE (A) 08/29/2016 0252   Sepsis  Labs: @LABRCNTIP (procalcitonin:4,lacticidven:4)  )No results found for this or any previous visit (from the past 240 hour(s)).   Invalid input(s): PROCALCITONIN, LACTICACIDVEN   Radiology Studies: Dg Chest 2 View  Result Date: 08/28/2016 CLINICAL DATA:  Dyspnea and weakness today EXAM: CHEST  2 VIEW COMPARISON:  08/19/2016 FINDINGS: BULLOUS EMPHYSEMATOUS DISEASE IS PRESENT, GREATEST IN THE RIGHT UPPER LOBE. MILD LINEAR SCARRING IN THE RIGHT LUNG IS STABLE. NO AIRSPACE CONSOLIDATION. NO EFFUSIONS. NORMAL PULMONARY VASCULATURE. HILAR, MEDIASTINAL AND CARDIAC CONTOURS ARE UNREMARKABLE AND UNCHANGED. INTACT APPEARANCES OF THE DUAL-CHAMBER TRANSVENOUS PACING LEADS. IMPRESSION: Severe bullous emphysematous disease. No acute cardiopulmonary findings. Electronically Signed   By: Valerie Roys.D.  On: 08/28/2016 05:48   Ct Head Wo Contrast  Result Date: 08/27/2016 CLINICAL DATA:  Facial paresthesias.  Confusion. EXAM: CT HEAD WITHOUT CONTRAST TECHNIQUE: Contiguous axial images were obtained from the base of the skull through the vertex without intravenous contrast. COMPARISON:  04/05/2016 FINDINGS: Brain: There is no intracranial hemorrhage, mass or evidence of acute infarction. There is mild generalized atrophy. There is mild chronic microvascular ischemic change. There is no significant extra-axial fluid collection. No acute intracranial findings are evident. Remote infarction in the anterior left lentiform nuclei, probably also in the left pons. Vascular: No hyperdense vessel or unexpected calcification. Skull: Normal. Negative for fracture or focal lesion. Sinuses/Orbits: No acute finding. IMPRESSION: No acute intracranial findings. There is moderate generalized atrophy and chronic appearing white matter hypodensities which likely represent small vessel ischemic disease. Remote left-sided infarctions, lentiform nuclei and pons. Electronically Signed   By: Andreas Newport M.D.   On: 08/27/2016  21:30        Scheduled Meds: . aspirin  300 mg Rectal Daily   Or  . aspirin  325 mg Oral Daily  . atorvastatin  20 mg Oral Daily  . cefTRIAXone (ROCEPHIN)  IV  1 g Intravenous Q24H  . enoxaparin (LOVENOX) injection  40 mg Subcutaneous Q24H  . Influenza vac split quadrivalent PF  0.5 mL Intramuscular Tomorrow-1000  . lisinopril  40 mg Oral Daily  . meloxicam  15 mg Oral Q breakfast  . pantoprazole  40 mg Oral Daily  . sertraline  100 mg Oral Daily  . tamsulosin  0.8 mg Oral QPC supper   Continuous Infusions:    LOS: 0 days    Time spent: 35 minutes    Kesha Hurrell A, MD [facility] Pager [phone]  If 7PM-7AM, please contact night-coverage [email or web address] Password [email or web address] 08/29/2016, 11:16 AM

## 2016-08-29 NOTE — Progress Notes (Signed)
STROKE TEAM PROGRESS NOTE   HISTORY OF PRESENT ILLNESS (per record) Wesley [name] is an 60 y.o. male past medical history of substance abuse, myocardial infarct, status post pacemaker, left-sided weakness, hypertension,  hyperlipidemia.  Patient was in his normal health the night before last, his wife left the next morning when coming home in the afternoon she noted that he was out of breath, staring forward as if in a daze which worried her that he may be having a stroke.The patientt was brought to [facility]. While there he stated he felt generalized weakness, with left arm numbness. Currently he remains as if he is abulic, slow to respond, and states his left arm has decreased sensation along with his left lower face.  In talking to family this is very much not his normal state. Usually he is very talkative and expressive. Patient has had CT of his head which showed no acute infarct, however unfortunately he has a pacemaker and is unable to have an MRI.  I spoken to the wife she has contacted the [facility] twitch the pacemaker was placed. She has signed a release form and is getting information about the pacemaker.  Date last known well: Date: 08/27/2016 Time last known well: Unable to determine tPA Given: No: out of window   SUBJECTIVE (INTERVAL HISTORY) His wife is at the bedside.  Overall he feels his condition is rapidly improving. Still has some left UE numbness but much mental status improved. He admitted that he just quit smoking 2 weeks ago and quit alcohol one year ago. Did not use illicit drugs and not taking BP check at home.    OBJECTIVE Temp:  [98.1 F (36.7 C)-98.8 F (37.1 C)] 98.8 F (37.1 C) (09/15 0522) Pulse Rate:  [59-64] 60 (09/15 0522) Cardiac Rhythm: Atrial paced (09/15 0700) Resp:  [16-18] 16 (09/15 0522) BP: (93-118)/(60-71) 118/71 (09/15 0522) SpO2:  [97 %-100 %] 98 % (09/15 0522) FiO2 (%):  [2 %] 2 % (09/14 1425)  CBC:  Recent Labs Lab  08/27/16 2054 08/27/16 2114 08/28/16 0703  WBC 11.4*  --  11.4*  NEUTROABS 8.7*  --   --   HGB 14.6 16.0 13.7  HCT 44.8 47.0 43.2  MCV 83.7  --  84.7  PLT 207  --  [id number]    Basic Metabolic Panel:  Recent Labs Lab 08/27/16 2054 08/27/16 2114  NA 138 139  K 4.0 4.1  CL 103 102  CO2 25  --   GLUCOSE 100* 98  BUN 20 21*  CREATININE 0.96 0.90  CALCIUM 9.8  --     Lipid Panel:    Component Value Date/Time   CHOL 108 (L) 11/29/2015 1524   TRIG 64 11/29/2015 1524   HDL 39 (L) 11/29/2015 1524   CHOLHDL 2.8 11/29/2015 1524   VLDL 13 11/29/2015 1524   LDLCALC 56 11/29/2015 1524   HgbA1c:  Lab Results  Component Value Date   HGBA1C 6.7 07/22/2016   Urine Drug Screen:    Component Value Date/Time   LABOPIA NONE DETECTED 04/04/2016 2204   COCAINSCRNUR NONE DETECTED 04/04/2016 2204   LABBENZ NONE DETECTED 04/04/2016 2204   AMPHETMU NONE DETECTED 04/04/2016 2204   THCU NONE DETECTED 04/04/2016 2204   LABBARB NONE DETECTED 04/04/2016 2204      IMAGING I have personally reviewed the radiological images below and agree with the radiology interpretations.  Dg Chest 2 View 08/28/2016 Severe bullous emphysematous disease. No acute cardiopulmonary findings.   Ct  Head Wo Contrast 08/27/2016 No acute intracranial findings. There is moderate generalized atrophy and chronic appearing white matter hypodensities which likely represent small vessel ischemic disease. Remote left-sided infarctions, lentiform nuclei and pons.   CTA Head and Neck  1. No acute finding. Stable CTA of the head and neck compared to April 2017. 2. No carotid stenosis in the neck. 3. Mild to moderate left vertebral artery origin stenosis. 4. Mild to moderate bilateral intracranial ICA stenosis. 5. Stable pattern of chronic microvascular disease.  TTE pending   PHYSICAL EXAM HEENT-  Normocephalic, no lesions, without obvious abnormality.  Normal external eye and conjunctiva.  Normal TM's bilaterally.   Normal auditory canals and external ears. Normal external nose, mucus membranes and septum.  Normal pharynx. Cardiovascular- S1, S2 normal, pulses palpable throughout   Lungs- chest clear, no wheezing, rales, normal symmetric air entry Abdomen- soft, non-tender; bowel sounds normal; no masses,  no organomegaly Extremities- no edema Lymph-no adenopathy palpable Musculoskeletal-no joint tenderness, deformity or swelling Skin-warm and dry, no hyperpigmentation, vitiligo, or suspicious lesions  Neurological Examination Mental Status: Alert, oriented, thought content appropriate.  Speech fluent without evidence of aphasia.  Able to follow 3 step commands without difficulty.  Cranial Nerves: II: Visual fields grossly normal III,IV, VI: ptosis not present, extra-ocular motions intact bilaterally V,VII: smile asymmetric on the left, facial light touch sensation symmetrical VIII: hearing normal bilaterally IX,X: uvula rises symmetrically XI: bilateral shoulder shrug symmetrical XII: midline tongue extension Motor: Right :  Upper extremity   5/5                                      Left:     Upper extremity   5/5             Lower extremity   5/5                                                  Lower extremity   5/5 Tone and bulk:normal tone throughout; no atrophy noted Sensory: Pinprick and light touch decrease in the left upper extremity Deep Tendon Reflexes: 2+ and symmetric throughout Plantars: Right: downgoing                                Left: downgoing Cerebellar: normal finger-to-nose and normal heel-to-shin test Gait: Not tested   ASSESSMENT/PLAN Mr. Wesley [name] is a 60 y.o. male with history of tuberculosis, substance abuse, status post permanent pacemaker, coronary artery disease with previous MI, hypertension, hyperlipidemia, COPD, and left hemiparesis presenting with generalized weakness and left-sided numbness. He did not receive IV t-PA due to late presentation.    Suspected right brain subcortical stroke, likely small vessel event.  Resultant  Left UE numbness  MRI - not performed 2/2 PPM  MRA - not performed 2/2 PPM  CTA Head and Neck - bilateral supraclinoid ICA atherosclerosis  CT and CT repeat - no acute infarct, but remote stroke at left caudate head  2D Echo - pending  LDL - 89  HgbA1c pending  VTE prophylaxis - Lovenox  Diet Heart Room service appropriate? Yes; Fluid consistency: Thin  No antithrombotic prior to admission, now on aspirin 325 mg daily. Continue ASA  on discharge  Patient counseled to be compliant with his antithrombotic medications  Ongoing aggressive stroke risk factor management  Therapy recommendations: Outpatient physical therapy recommended. OT evaluation is pending  Disposition: Pending  Hypertension  Blood pressure tends to run low  Permissive hypertension (OK if < 220/120) but gradually normalize in 5-7 days  Long-term BP goal normotensive  Hyperlipidemia  Home meds:  Lipitor 20 mg daily resumed in hospital  LDL 89, goal < 70  Increase Lipitor to 40 mg daily  Continue statin at discharge  Other Stroke Risk Factors  Advanced age  Cigarette smoker - quit smoking 2 weeks ago  Obesity, Body mass index is 30.16 kg/m., recommend weight loss, diet and exercise as appropriate   Coronary artery disease  Hx of stroke - CT showed remote left caudate head infarct.  Other Active Problems  UDS - positive only for opiates  Hospital day # 0  Neurology will sign off. Please call with questions. Pt will follow up with Cecille Rubin NP at [facility] in about 6 weeks. Thanks for the consult.  Rosalin Hawking, MD PhD Stroke Neurology 08/29/2016 2:47 PM     To contact Stroke Continuity provider, please refer to [email or web address] After hours, contact General Neurology

## 2016-08-30 ENCOUNTER — Other Ambulatory Visit (HOSPITAL_COMMUNITY): Payer: Medicaid Other

## 2016-08-30 ENCOUNTER — Inpatient Hospital Stay (HOSPITAL_COMMUNITY): Payer: Medicaid Other

## 2016-08-30 DIAGNOSIS — I6330 Cerebral infarction due to thrombosis of unspecified cerebral artery: Principal | ICD-10-CM

## 2016-08-30 DIAGNOSIS — N3001 Acute cystitis with hematuria: Secondary | ICD-10-CM

## 2016-08-30 DIAGNOSIS — Z8673 Personal history of transient ischemic attack (TIA), and cerebral infarction without residual deficits: Secondary | ICD-10-CM

## 2016-08-30 DIAGNOSIS — G459 Transient cerebral ischemic attack, unspecified: Secondary | ICD-10-CM

## 2016-08-30 LAB — GLUCOSE, CAPILLARY: GLUCOSE-CAPILLARY: 128 mg/dL — AB (ref 65–99)

## 2016-08-30 LAB — HEMOGLOBIN A1C
Hgb A1c MFr Bld: 6.8 % — ABNORMAL HIGH (ref 4.8–5.6)
Mean Plasma Glucose: 148 mg/dL

## 2016-08-30 LAB — ECHOCARDIOGRAM COMPLETE
Height: 71 in
Weight: 3460.34 oz

## 2016-08-30 MED ORDER — IBUPROFEN 200 MG PO TABS: 600.0000 mg | ORAL_TABLET | Freq: Once | ORAL | Status: DC

## 2016-08-30 NOTE — Progress Notes (Signed)
PROGRESS NOTE  Wesley [name]  [id number] DOB: [birth year] DOA: 08/27/2016 PCP: Minerva Ends, MD Outpatient Specialists:  Subjective: Seen with his wife at bedside, reported fever earlier today with temp of 100.1. Also has headache with that.  Brief Narrative:  Wesley [name] is a 60 y.o. male with a past medical history significant for HTN, smoking, SSS with pacer, and nonobstructive CAD who presents with weakness.  The patient was in his usual state of health until tonight, some timebefore 7:00 when the patient's wife got home from work, and she noticed him to be "very short of breath and kind of distressed" when he came back from a walk.  Shortly after that, she found him in the living room lying on the couch, again appearing very tired.  He spoke to her, "but it was slurred" and then he said he "just didn't feel good" and had staring spells (although still responsive during them).  He reports that he had been lightheaded and dizzy all day, and that by evening, his "whole body was weak", he had numbness on his left side arm and leg, and felt hot and cold flashes, so his wife brought him to the [facility] ER.   Assessment & Plan:   Principal Problem:   Left sided numbness Active Problems:   Cardiac pacemaker in situ   Essential hypertension   COPD mixed type (HCC)   Tobacco use disorder   SSS (sick sinus syndrome) (HCC)   Aphasia   UTI (urinary tract infection)   Cerebral thrombosis with cerebral infarction   History of stroke   Suspected acute Stroke vs TIA:  -Presented with left-sided numbness including the arm and the leg as well as word finding difficulty. -Neurology consulted. -MRI brain and MRA was not done because of pacemaker not compatible with MRI. -CT angiography of the brain and neck was done showed no acute events. -2-D echocardiogram pending. -Neurology signed off.  UTI -Urinalysis consistent with UTI, started on Rocephin. -Urine culture sent, follow  urine culture.  Diabetes mellitus type 2 -With hemoglobin A1c of 6.8, carbohydrate modified diet. -On discharge will place on metformin  HTN:  -Continue lisinopril -Continue statin  BPH:  -Continue home tamsulosin  Other medications:  -Continue sertraline -Continue Mobic -Continue PPI   DVT prophylaxis: Lovenox Code Status: Full Code Family Communication:  Disposition Plan:  Diet: Diet Heart Room service appropriate? Yes; Fluid consistency: Thin  Consultants:   Neuro  Procedures:   None  Antimicrobials:   None   Objective: Vitals:   08/30/16 0132 08/30/16 0227 08/30/16 0527 08/30/16 0950  BP: 118/61 (!) 107/50 (!) 108/55 117/65  Pulse: 63 68 65 60  Resp: 20  18 18   Temp: 98.1 F (36.7 C) 100.1 F (37.8 C) 98.7 F (37.1 C) 99.2 F (37.3 C)  TempSrc: Oral Oral Oral Oral  SpO2: 98% 99% 94% 95%  Weight:      Height:        Intake/Output Summary (Last 24 hours) at 08/30/16 1146 Last data filed at 08/30/16 1100  Gross per 24 hour  Intake              290 ml  Output             2000 ml  Net            -1710 ml   Filed Weights   08/27/16 2045 08/28/16 0417 08/28/16 0600  Weight: 95.7 kg (211 lb) 95.7 kg (211 lb) 98.1 kg (216  lb 4.3 oz)    Examination: General exam: Appears calm and comfortable  Respiratory system: Clear to auscultation. Respiratory effort normal. Cardiovascular system: S1 & S2 heard, RRR. No JVD, murmurs, rubs, gallops or clicks. No pedal edema. Gastrointestinal system: Abdomen is nondistended, soft and nontender. No organomegaly or masses felt. Normal bowel sounds heard. Central nervous system: Alert and oriented. No focal neurological deficits. Extremities: Symmetric 5 x 5 power. Skin: No rashes, lesions or ulcers Psychiatry: Judgement and insight appear normal. Mood & affect appropriate.   Data Reviewed: I have personally reviewed following labs and imaging studies  CBC:  Recent Labs Lab 08/27/16 2054 08/27/16 2114  08/28/16 0703  WBC 11.4*  --  11.4*  NEUTROABS 8.7*  --   --   HGB 14.6 16.0 13.7  HCT 44.8 47.0 43.2  MCV 83.7  --  84.7  PLT 207  --  [id number]   Basic Metabolic Panel:  Recent Labs Lab 08/27/16 2054 08/27/16 2114 08/29/16 0759  NA 138 139 136  K 4.0 4.1 4.2  CL 103 102 103  CO2 25  --  27  GLUCOSE 100* 98 119*  BUN 20 21* 18  CREATININE 0.96 0.90 0.84  CALCIUM 9.8  --  9.2   GFR: Estimated Creatinine Clearance: 111.6 mL/min (by C-G formula based on SCr of 0.84 mg/dL). Liver Function Tests:  Recent Labs Lab 08/27/16 2054  AST 17  ALT 27  ALKPHOS 89  BILITOT 0.8  PROT 8.4*  ALBUMIN 4.3   No results for input(s): LIPASE, AMYLASE in the last 168 hours. No results for input(s): AMMONIA in the last 168 hours. Coagulation Profile:  Recent Labs Lab 08/27/16 2054  INR 0.98   Cardiac Enzymes: No results for input(s): CKTOTAL, CKMB, CKMBINDEX, TROPONINI in the last 168 hours. BNP (last 3 results) No results for input(s): PROBNP in the last 8760 hours. HbA1C:  Recent Labs  08/29/16 0759  HGBA1C 6.8*   CBG:  Recent Labs Lab 08/27/16 2050 08/28/16 2119 08/29/16 2043  GLUCAP 104* 169* 118*   Lipid Profile:  Recent Labs  08/29/16 0759  CHOL 134  HDL 34*  LDLCALC 89  TRIG 53  CHOLHDL 3.9   Thyroid Function Tests: No results for input(s): TSH, T4TOTAL, FREET4, T3FREE, THYROIDAB in the last 72 hours. Anemia Panel: No results for input(s): VITAMINB12, FOLATE, FERRITIN, TIBC, IRON, RETICCTPCT in the last 72 hours. Urine analysis:    Component Value Date/Time   COLORURINE YELLOW 08/29/2016 0252   APPEARANCEUR CLOUDY (A) 08/29/2016 0252   LABSPEC 1.019 08/29/2016 0252   PHURINE 5.5 08/29/2016 0252   GLUCOSEU NEGATIVE 08/29/2016 0252   HGBUR SMALL (A) 08/29/2016 0252   BILIRUBINUR NEGATIVE 08/29/2016 0252   BILIRUBINUR Negative 07/22/2016 1142   KETONESUR NEGATIVE 08/29/2016 0252   PROTEINUR NEGATIVE 08/29/2016 0252   UROBILINOGEN 0.2 07/22/2016  1142   NITRITE POSITIVE (A) 08/29/2016 0252   LEUKOCYTESUR LARGE (A) 08/29/2016 0252   Sepsis Labs: @LABRCNTIP (procalcitonin:4,lacticidven:4)  )No results found for this or any previous visit (from the past 240 hour(s)).   Invalid input(s): PROCALCITONIN, LACTICACIDVEN   Radiology Studies: Ct Angio Head W Or Wo Contrast  Result Date: 08/29/2016 CLINICAL DATA:  Left-sided weakness, resolve. EXAM: CT ANGIOGRAPHY HEAD AND NECK TECHNIQUE: Multidetector CT imaging of the head and neck was performed using the standard protocol during bolus administration of intravenous contrast. Multiplanar CT image reconstructions and MIPs were obtained to evaluate the vascular anatomy. Carotid stenosis measurements (when applicable) are obtained utilizing NASCET criteria, using  the distal internal carotid diameter as the denominator. CONTRAST:  80 cc Isovue 370 intravenous COMPARISON:  Head CT from 2 days ago.  CTA 04/05/2016 FINDINGS: CT HEAD Brain: Stable pattern of chronic microvascular disease with patchy small vessel ischemic change in the cerebral white matter, clustered in the left frontal white matter in anterior limb internal capsule. Chronically indistinct deep gray nuclei. No hemorrhage, hydrocephalus, or mass. Calvarium and skull base: Negative Paranasal sinuses: Negative Orbits: Negative CTA NECK Aortic arch: Diffuse atherosclerotic calcification. Three vessel branching. No evidence of aneurysm or dissection. No great vessel stenosis. Right carotid system: Mild atheromatous changes without stenosis, dissection, or plaque ulceration. Left carotid system: Mild atheromatous changes without stenosis, dissection, or plaque ulceration. Vertebral arteries:No proximal subclavian artery stenosis. Portions of the proximal vertebral arteries are partially obscured by artifact from intravenous contrast. Moderate left vertebral origin stenosis. No evidence of dissection. Codominant vessels are patent to the dura.  Skeleton: No acute or aggressive finding. Other neck: No incidental mass or adenopathy. Upper chest: Paraseptal emphysema with bullous changes at the right apex. CTA HEAD Limited by venous contamination and suboptimal contrast bolus density. Anterior circulation: Symmetric carotid arteries and branching. Atherosclerotic calcified plaque along both siphons with right cavernous ICA and left supraclinoid ICA mild-to-moderate stenoses, stable. No major branch occlusion. Negative for aneurysm. Posterior circulation: Symmetric vertebral arteries with proximal basilar fenestration. Visible left PICA. No visible posterior communicating arteries. No major branch occlusion or flow limiting stenosis. Negative for aneurysm. Venous sinuses: Patent Anatomic variants: Incomplete circle-of-Willis. Fenestrated proximal basilar. Delayed phase: No parenchymal enhancement or mass lesion. IMPRESSION: 1. No acute finding. Stable CTA of the head and neck compared to April 2017. 2. No carotid stenosis in the neck. 3. Mild to moderate left vertebral artery origin stenosis. 4. Mild to moderate bilateral intracranial ICA stenosis. 5. Stable pattern of chronic microvascular disease. Electronically Signed   By: Monte Fantasia M.D.   On: 08/29/2016 13:08   Ct Angio Neck W Or Wo Contrast  Result Date: 08/29/2016 CLINICAL DATA:  Left-sided weakness, resolve. EXAM: CT ANGIOGRAPHY HEAD AND NECK TECHNIQUE: Multidetector CT imaging of the head and neck was performed using the standard protocol during bolus administration of intravenous contrast. Multiplanar CT image reconstructions and MIPs were obtained to evaluate the vascular anatomy. Carotid stenosis measurements (when applicable) are obtained utilizing NASCET criteria, using the distal internal carotid diameter as the denominator. CONTRAST:  80 cc Isovue 370 intravenous COMPARISON:  Head CT from 2 days ago.  CTA 04/05/2016 FINDINGS: CT HEAD Brain: Stable pattern of chronic microvascular  disease with patchy small vessel ischemic change in the cerebral white matter, clustered in the left frontal white matter in anterior limb internal capsule. Chronically indistinct deep gray nuclei. No hemorrhage, hydrocephalus, or mass. Calvarium and skull base: Negative Paranasal sinuses: Negative Orbits: Negative CTA NECK Aortic arch: Diffuse atherosclerotic calcification. Three vessel branching. No evidence of aneurysm or dissection. No great vessel stenosis. Right carotid system: Mild atheromatous changes without stenosis, dissection, or plaque ulceration. Left carotid system: Mild atheromatous changes without stenosis, dissection, or plaque ulceration. Vertebral arteries:No proximal subclavian artery stenosis. Portions of the proximal vertebral arteries are partially obscured by artifact from intravenous contrast. Moderate left vertebral origin stenosis. No evidence of dissection. Codominant vessels are patent to the dura. Skeleton: No acute or aggressive finding. Other neck: No incidental mass or adenopathy. Upper chest: Paraseptal emphysema with bullous changes at the right apex. CTA HEAD Limited by venous contamination and suboptimal contrast bolus density. Anterior circulation:  Symmetric carotid arteries and branching. Atherosclerotic calcified plaque along both siphons with right cavernous ICA and left supraclinoid ICA mild-to-moderate stenoses, stable. No major branch occlusion. Negative for aneurysm. Posterior circulation: Symmetric vertebral arteries with proximal basilar fenestration. Visible left PICA. No visible posterior communicating arteries. No major branch occlusion or flow limiting stenosis. Negative for aneurysm. Venous sinuses: Patent Anatomic variants: Incomplete circle-of-Willis. Fenestrated proximal basilar. Delayed phase: No parenchymal enhancement or mass lesion. IMPRESSION: 1. No acute finding. Stable CTA of the head and neck compared to April 2017. 2. No carotid stenosis in the neck.  3. Mild to moderate left vertebral artery origin stenosis. 4. Mild to moderate bilateral intracranial ICA stenosis. 5. Stable pattern of chronic microvascular disease. Electronically Signed   By: Monte Fantasia M.D.   On: 08/29/2016 13:08        Scheduled Meds: . aspirin  300 mg Rectal Daily   Or  . aspirin  325 mg Oral Daily  . atorvastatin  40 mg Oral Daily  . cefTRIAXone (ROCEPHIN)  IV  1 g Intravenous Q24H  . enoxaparin (LOVENOX) injection  40 mg Subcutaneous Q24H  . fluticasone  1 spray Each Nare Daily  . ibuprofen  600 mg Oral Once  . lisinopril  40 mg Oral Daily  . meloxicam  15 mg Oral Q breakfast  . pantoprazole  40 mg Oral Daily  . sertraline  100 mg Oral Daily  . tamsulosin  0.8 mg Oral QPC supper   Continuous Infusions:    LOS: 1 day    Time spent: 35 minutes    Maveryk Renstrom A, MD [facility] Pager [phone]  If 7PM-7AM, please contact night-coverage [email or web address] Password [email or web address] 08/30/2016, 11:46 AM

## 2016-08-30 NOTE — Progress Notes (Signed)
Patient with severe headache and some slight nausea this evening. Tylenol PRN was ineffective. MD paged. Waiting for new orders. Brecklynn Jian, Rande Brunt, RN

## 2016-08-30 NOTE — Progress Notes (Signed)
  Echocardiogram 2D Echocardiogram has been performed.  Diamond Nickel 08/30/2016, 1:38 PM

## 2016-08-31 DIAGNOSIS — E669 Obesity, unspecified: Secondary | ICD-10-CM

## 2016-08-31 DIAGNOSIS — E1169 Type 2 diabetes mellitus with other specified complication: Secondary | ICD-10-CM

## 2016-08-31 DIAGNOSIS — R0683 Snoring: Secondary | ICD-10-CM

## 2016-08-31 DIAGNOSIS — E119 Type 2 diabetes mellitus without complications: Secondary | ICD-10-CM

## 2016-08-31 LAB — URINE CULTURE

## 2016-08-31 MED ORDER — METFORMIN HCL 500 MG PO TABS: 500.0000 mg | ORAL_TABLET | Freq: Two times a day (BID) | ORAL | 1 refills | Status: DC

## 2016-08-31 MED ORDER — MAGNESIUM HYDROXIDE 400 MG/5ML PO SUSP
30.0000 mL | Freq: Once | ORAL | Status: AC
  Administered 2016-08-31: 30 mL via ORAL
  Filled 2016-08-31: qty 30

## 2016-08-31 MED ORDER — ATORVASTATIN CALCIUM 40 MG PO TABS: 40.0000 mg | ORAL_TABLET | Freq: Every day | ORAL | 2 refills | Status: DC

## 2016-08-31 MED ORDER — CEFUROXIME AXETIL 500 MG PO TABS: 500.0000 mg | ORAL_TABLET | Freq: Two times a day (BID) | ORAL | 0 refills | Status: DC

## 2016-08-31 NOTE — Discharge Summary (Signed)
Physician Discharge Summary  Wesley [name] [id number] DOB: 1956/02/10 DOA: 08/27/2016  PCP: Minerva Ends, MD  Admit date: 08/27/2016 Discharge date: 08/31/2016  Admitted From: Home Disposition: Home  Recommendations for Outpatient Follow-up:  1. Follow up with PCP in 1-2 weeks 2. Please obtain BMP/CBC in one week  Home Health: NA, Outpatient physical therapy Equipment/Devices: NA  Discharge Condition: Stable CODE STATUS: Full Diet recommendation: Heart Healthy / Carb Modified.  Brief/Interim Summary: Wesley [name] a 60 y.o.malewith a past medical history significant for HTN, smoking, SSS with pacer, and nonobstructive CADwho presents with weakness.  The patient was in his usual state of health until tonight, some timebefore 7:00 when the patient's wife got home from work, and she noticed him to be "very short of breath and kind of distressed" when he came back from a walk. Shortly after that, she found him in the living room lying on the couch, again appearing very tired. He spoke to her, "but it was slurred" and then he said he "just didn't feel good" and had staring spells (although still responsive during them). He reports that he had been lightheaded and dizzy all day, and that by evening, his "whole body was weak", he had numbness on his left side arm and leg, and felt hot and cold flashes, so his wife brought him to the [facility] ER.  Discharge Diagnoses:  Principal Problem:   Left sided numbness Active Problems:   Cardiac pacemaker in situ   Essential hypertension   COPD mixed type (HCC)   Tobacco use disorder   SSS (sick sinus syndrome) (HCC)   Aphasia   UTI (urinary tract infection)   Cerebral thrombosis with cerebral infarction   History of stroke    Suspected acute Stroke vs TIA: -Presented with left-sided numbness including the arm and the leg as well as word finding difficulty. -Neurology consulted. -MRI brain and MRA was not done because  of pacemaker not compatible with MRI. -CT angiography of the brain and neck was done showed no acute events. -2-D echocardiogram showed LVEF of 60-65% without intra-ventricular thrombus -Not sure of his numbness is secondary to stroke versus systemic effect of the UTI.  Escherichia coli UTI -Urinalysis consistent with UTI, started on Rocephin initially. -This is considered an complicated UTI because of presence of diabetes and male gender. -Discharged on Ceftin to complete 7 days of antibiotics.  Diabetes mellitus type 2, new onset -With hemoglobin A1c of 6.8, carbohydrate modified diet. -Appears to be mild diabetes, controlled sugar, started on metformin 500 mg twice a day. -Follow-up with primary care physician for further follow-up.  Dyslipidemia -Total cholesterol 134, HDL 34 and LDL is 89. Target LDL <70. -Per neurology recommendation Lipitor increased to 40 mg.  HTN: -Continue lisinopril -Continue statin  BPH: -Continue home tamsulosin  Other medications: -Continue sertraline -Continue Mobic -Continue PPI   Discharge Instructions  Discharge Instructions    Ambulatory referral to Neurology    Complete by:  As directed    Follow up with NP Cecille Rubin at [facility] in about 6 weeks. Thanks.   Diet - low sodium heart healthy    Complete by:  As directed    Increase activity slowly    Complete by:  As directed        Medication List    STOP taking these medications   ibuprofen 200 MG tablet Commonly known as:  ADVIL,MOTRIN     TAKE these medications   albuterol 108 (90 Base) MCG/ACT inhaler Commonly  known as:  PROVENTIL HFA;VENTOLIN HFA Inhale 2 puffs into the lungs every 6 (six) hours as needed for wheezing or shortness of breath.   atorvastatin 40 MG tablet Commonly known as:  LIPITOR Take 1 tablet (40 mg total) by mouth daily at 6 PM. What changed:  medication strength  how much to take  when to take this   cefUROXime 500 MG  tablet Commonly known as:  CEFTIN Take 1 tablet (500 mg total) by mouth 2 (two) times daily with a meal.   lisinopril 40 MG tablet Commonly known as:  PRINIVIL,ZESTRIL Take 40 mg by mouth daily.   meloxicam 15 MG tablet Commonly known as:  MOBIC Take 1 tablet (15 mg total) by mouth daily. What changed:  when to take this   metFORMIN 500 MG tablet Commonly known as:  GLUCOPHAGE Take 1 tablet (500 mg total) by mouth 2 (two) times daily with a meal.   pantoprazole 40 MG tablet Commonly known as:  PROTONIX Take 1 tablet (40 mg total) by mouth daily. What changed:  when to take this   sertraline 100 MG tablet Commonly known as:  ZOLOFT Take 100 mg by mouth daily.   tamsulosin 0.4 MG Caps capsule Commonly known as:  FLOMAX 0.4 mg by mouth nightly after supper for two weeks, then 0.8 mg nightly What changed:  how much to take  how to take this  when to take this  additional instructions   Vitamin D3 2000 units Tabs Take 2,000 Units by mouth daily. What changed:  when to take this   Wrist Splint/Elastic Right Lg Misc 1 each by Does not apply route at bedtime.   Wrist Splint/Elastic Left Lg Misc 1 each by Does not apply route daily.      Follow-up Information    Dennie Bible, NP. Schedule an appointment as soon as possible for a visit in 6 week(s).   Specialty:  Family Medicine Contact information: [address] [place] [address] [phone]          Allergies  Allergen Reactions  . Penicillins Swelling    Has patient had a PCN reaction causing immediate rash, facial/tongue/throat swelling, SOB or lightheadedness with hypotension: unknown Has patient had a PCN reaction causing severe rash involving mucus membranes or skin necrosis: unknown Has patient had a PCN reaction that required hospitalization : unknown Has patient had a PCN reaction occurring within the last 10 years: no If all of the above answers are "NO", then may  proceed with Cephalosporin use.     Consultations:  Neurology   Procedures/Studies: Ct Angio Head W Or Wo Contrast  Result Date: 08/29/2016 CLINICAL DATA:  Left-sided weakness, resolve. EXAM: CT ANGIOGRAPHY HEAD AND NECK TECHNIQUE: Multidetector CT imaging of the head and neck was performed using the standard protocol during bolus administration of intravenous contrast. Multiplanar CT image reconstructions and MIPs were obtained to evaluate the vascular anatomy. Carotid stenosis measurements (when applicable) are obtained utilizing NASCET criteria, using the distal internal carotid diameter as the denominator. CONTRAST:  80 cc Isovue 370 intravenous COMPARISON:  Head CT from 2 days ago.  CTA 04/05/2016 FINDINGS: CT HEAD Brain: Stable pattern of chronic microvascular disease with patchy small vessel ischemic change in the cerebral white matter, clustered in the left frontal white matter in anterior limb internal capsule. Chronically indistinct deep gray nuclei. No hemorrhage, hydrocephalus, or mass. Calvarium and skull base: Negative Paranasal sinuses: Negative Orbits: Negative CTA NECK Aortic arch: Diffuse atherosclerotic calcification. Three  vessel branching. No evidence of aneurysm or dissection. No great vessel stenosis. Right carotid system: Mild atheromatous changes without stenosis, dissection, or plaque ulceration. Left carotid system: Mild atheromatous changes without stenosis, dissection, or plaque ulceration. Vertebral arteries:No proximal subclavian artery stenosis. Portions of the proximal vertebral arteries are partially obscured by artifact from intravenous contrast. Moderate left vertebral origin stenosis. No evidence of dissection. Codominant vessels are patent to the dura. Skeleton: No acute or aggressive finding. Other neck: No incidental mass or adenopathy. Upper chest: Paraseptal emphysema with bullous changes at the right apex. CTA HEAD Limited by venous contamination and suboptimal  contrast bolus density. Anterior circulation: Symmetric carotid arteries and branching. Atherosclerotic calcified plaque along both siphons with right cavernous ICA and left supraclinoid ICA mild-to-moderate stenoses, stable. No major branch occlusion. Negative for aneurysm. Posterior circulation: Symmetric vertebral arteries with proximal basilar fenestration. Visible left PICA. No visible posterior communicating arteries. No major branch occlusion or flow limiting stenosis. Negative for aneurysm. Venous sinuses: Patent Anatomic variants: Incomplete circle-of-Willis. Fenestrated proximal basilar. Delayed phase: No parenchymal enhancement or mass lesion. IMPRESSION: 1. No acute finding. Stable CTA of the head and neck compared to April 2017. 2. No carotid stenosis in the neck. 3. Mild to moderate left vertebral artery origin stenosis. 4. Mild to moderate bilateral intracranial ICA stenosis. 5. Stable pattern of chronic microvascular disease. Electronically Signed   By: Monte Fantasia M.D.   On: 08/29/2016 13:08   Dg Chest 2 View  Result Date: 08/28/2016 CLINICAL DATA:  Dyspnea and weakness today EXAM: CHEST  2 VIEW COMPARISON:  08/19/2016 FINDINGS: BULLOUS EMPHYSEMATOUS DISEASE IS PRESENT, GREATEST IN THE RIGHT UPPER LOBE. MILD LINEAR SCARRING IN THE RIGHT LUNG IS STABLE. NO AIRSPACE CONSOLIDATION. NO EFFUSIONS. NORMAL PULMONARY VASCULATURE. HILAR, MEDIASTINAL AND CARDIAC CONTOURS ARE UNREMARKABLE AND UNCHANGED. INTACT APPEARANCES OF THE DUAL-CHAMBER TRANSVENOUS PACING LEADS. IMPRESSION: Severe bullous emphysematous disease. No acute cardiopulmonary findings. Electronically Signed   By: Andreas Newport M.D.   On: 08/28/2016 05:48   Dg Chest 2 View  Result Date: 08/19/2016 CLINICAL DATA:  Chest pain and shortness of breath for 6 months. EXAM: CHEST  2 VIEW COMPARISON:  04/04/2016 FINDINGS: There is a large right apical bulla. There is mild right midlung scarring. There is no focal parenchymal opacity. There  is no pleural effusion or pneumothorax. The heart and mediastinal contours are unremarkable. There is a dual lead AICD. The osseous structures are unremarkable. IMPRESSION: No active cardiopulmonary disease. Electronically Signed   By: Kathreen Devoid   On: 08/19/2016 14:05   Ct Head Wo Contrast  Result Date: 08/27/2016 CLINICAL DATA:  Facial paresthesias.  Confusion. EXAM: CT HEAD WITHOUT CONTRAST TECHNIQUE: Contiguous axial images were obtained from the base of the skull through the vertex without intravenous contrast. COMPARISON:  04/05/2016 FINDINGS: Brain: There is no intracranial hemorrhage, mass or evidence of acute infarction. There is mild generalized atrophy. There is mild chronic microvascular ischemic change. There is no significant extra-axial fluid collection. No acute intracranial findings are evident. Remote infarction in the anterior left lentiform nuclei, probably also in the left pons. Vascular: No hyperdense vessel or unexpected calcification. Skull: Normal. Negative for fracture or focal lesion. Sinuses/Orbits: No acute finding. IMPRESSION: No acute intracranial findings. There is moderate generalized atrophy and chronic appearing white matter hypodensities which likely represent small vessel ischemic disease. Remote left-sided infarctions, lentiform nuclei and pons. Electronically Signed   By: Andreas Newport M.D.   On: 08/27/2016 21:30   Ct Angio Neck W Or Wo  Contrast  Result Date: 08/29/2016 CLINICAL DATA:  Left-sided weakness, resolve. EXAM: CT ANGIOGRAPHY HEAD AND NECK TECHNIQUE: Multidetector CT imaging of the head and neck was performed using the standard protocol during bolus administration of intravenous contrast. Multiplanar CT image reconstructions and MIPs were obtained to evaluate the vascular anatomy. Carotid stenosis measurements (when applicable) are obtained utilizing NASCET criteria, using the distal internal carotid diameter as the denominator. CONTRAST:  80 cc Isovue  370 intravenous COMPARISON:  Head CT from 2 days ago.  CTA 04/05/2016 FINDINGS: CT HEAD Brain: Stable pattern of chronic microvascular disease with patchy small vessel ischemic change in the cerebral white matter, clustered in the left frontal white matter in anterior limb internal capsule. Chronically indistinct deep gray nuclei. No hemorrhage, hydrocephalus, or mass. Calvarium and skull base: Negative Paranasal sinuses: Negative Orbits: Negative CTA NECK Aortic arch: Diffuse atherosclerotic calcification. Three vessel branching. No evidence of aneurysm or dissection. No great vessel stenosis. Right carotid system: Mild atheromatous changes without stenosis, dissection, or plaque ulceration. Left carotid system: Mild atheromatous changes without stenosis, dissection, or plaque ulceration. Vertebral arteries:No proximal subclavian artery stenosis. Portions of the proximal vertebral arteries are partially obscured by artifact from intravenous contrast. Moderate left vertebral origin stenosis. No evidence of dissection. Codominant vessels are patent to the dura. Skeleton: No acute or aggressive finding. Other neck: No incidental mass or adenopathy. Upper chest: Paraseptal emphysema with bullous changes at the right apex. CTA HEAD Limited by venous contamination and suboptimal contrast bolus density. Anterior circulation: Symmetric carotid arteries and branching. Atherosclerotic calcified plaque along both siphons with right cavernous ICA and left supraclinoid ICA mild-to-moderate stenoses, stable. No major branch occlusion. Negative for aneurysm. Posterior circulation: Symmetric vertebral arteries with proximal basilar fenestration. Visible left PICA. No visible posterior communicating arteries. No major branch occlusion or flow limiting stenosis. Negative for aneurysm. Venous sinuses: Patent Anatomic variants: Incomplete circle-of-Willis. Fenestrated proximal basilar. Delayed phase: No parenchymal enhancement or mass  lesion. IMPRESSION: 1. No acute finding. Stable CTA of the head and neck compared to April 2017. 2. No carotid stenosis in the neck. 3. Mild to moderate left vertebral artery origin stenosis. 4. Mild to moderate bilateral intracranial ICA stenosis. 5. Stable pattern of chronic microvascular disease. Electronically Signed   By: Monte Fantasia M.D.   On: 08/29/2016 13:08    (Echo, Carotid, EGD, Colonoscopy, ERCP)    Subjective:   Discharge Exam: Vitals:   08/31/16 0528 08/31/16 0923  BP: 111/65 113/71  Pulse: (!) 59 73  Resp: 16 16  Temp: 98.5 F (36.9 C) 98 F (36.7 C)   Vitals:   08/30/16 2106 08/31/16 0122 08/31/16 0528 08/31/16 0923  BP: 118/72 129/74 111/65 113/71  Pulse: 65 65 (!) 59 73  Resp: 16 16 16 16   Temp: 99.1 F (37.3 C) 99.4 F (37.4 C) 98.5 F (36.9 C) 98 F (36.7 C)  TempSrc: Oral Oral Oral Oral  SpO2: 99% 96% 97% 98%  Weight:      Height:        General: Pt is alert, awake, not in acute distress Cardiovascular: RRR, S1/S2 +, no rubs, no gallops Respiratory: CTA bilaterally, no wheezing, no rhonchi Abdominal: Soft, NT, ND, bowel sounds + Extremities: no edema, no cyanosis    The results of significant diagnostics from this hospitalization (including imaging, microbiology, ancillary and laboratory) are listed below for reference.     Microbiology: Recent Results (from the past 240 hour(s))  Culture, Urine     Status: Abnormal  Collection Time: 08/29/16  2:50 AM  Result Value Ref Range Status   Specimen Description URINE, RANDOM  Final   Special Requests NONE  Final   Culture >=100,000 COLONIES/mL ESCHERICHIA COLI (A)  Final   Report Status 08/31/2016 FINAL  Final   Organism ID, Bacteria ESCHERICHIA COLI (A)  Final      Susceptibility   Escherichia coli - MIC*    AMPICILLIN 4 SENSITIVE Sensitive     CEFAZOLIN <=4 SENSITIVE Sensitive     CEFTRIAXONE <=1 SENSITIVE Sensitive     CIPROFLOXACIN <=0.25 SENSITIVE Sensitive     GENTAMICIN <=1  SENSITIVE Sensitive     IMIPENEM <=0.25 SENSITIVE Sensitive     NITROFURANTOIN <=16 SENSITIVE Sensitive     TRIMETH/SULFA <=20 SENSITIVE Sensitive     AMPICILLIN/SULBACTAM <=2 SENSITIVE Sensitive     PIP/TAZO <=4 SENSITIVE Sensitive     Extended ESBL NEGATIVE Sensitive     * >=100,000 COLONIES/mL ESCHERICHIA COLI     Labs: BNP (last 3 results)  Recent Labs  04/04/16 2141  BNP 8.2   Basic Metabolic Panel:  Recent Labs Lab 08/27/16 2054 08/27/16 2114 08/29/16 0759  NA 138 139 136  K 4.0 4.1 4.2  CL 103 102 103  CO2 25  --  27  GLUCOSE 100* 98 119*  BUN 20 21* 18  CREATININE 0.96 0.90 0.84  CALCIUM 9.8  --  9.2   Liver Function Tests:  Recent Labs Lab 08/27/16 2054  AST 17  ALT 27  ALKPHOS 89  BILITOT 0.8  PROT 8.4*  ALBUMIN 4.3   No results for input(s): LIPASE, AMYLASE in the last 168 hours. No results for input(s): AMMONIA in the last 168 hours. CBC:  Recent Labs Lab 08/27/16 2054 08/27/16 2114 08/28/16 0703  WBC 11.4*  --  11.4*  NEUTROABS 8.7*  --   --   HGB 14.6 16.0 13.7  HCT 44.8 47.0 43.2  MCV 83.7  --  84.7  PLT 207  --  184   Cardiac Enzymes: No results for input(s): CKTOTAL, CKMB, CKMBINDEX, TROPONINI in the last 168 hours. BNP: Invalid input(s): POCBNP CBG:  Recent Labs Lab 08/27/16 2050 08/28/16 2119 08/29/16 2043 08/30/16 2110  GLUCAP 104* 169* 118* 128*   D-Dimer No results for input(s): DDIMER in the last 72 hours. Hgb A1c  Recent Labs  08/29/16 0759  HGBA1C 6.8*   Lipid Profile  Recent Labs  08/29/16 0759  CHOL 134  HDL 34*  LDLCALC 89  TRIG 53  CHOLHDL 3.9   Thyroid function studies No results for input(s): TSH, T4TOTAL, T3FREE, THYROIDAB in the last 72 hours.  Invalid input(s): FREET3 Anemia work up No results for input(s): VITAMINB12, FOLATE, FERRITIN, TIBC, IRON, RETICCTPCT in the last 72 hours. Urinalysis    Component Value Date/Time   COLORURINE YELLOW 08/29/2016 0252   APPEARANCEUR CLOUDY  (A) 08/29/2016 0252   LABSPEC 1.019 08/29/2016 0252   PHURINE 5.5 08/29/2016 0252   GLUCOSEU NEGATIVE 08/29/2016 0252   HGBUR SMALL (A) 08/29/2016 0252   BILIRUBINUR NEGATIVE 08/29/2016 0252   BILIRUBINUR Negative 07/22/2016 1142   KETONESUR NEGATIVE 08/29/2016 0252   PROTEINUR NEGATIVE 08/29/2016 0252   UROBILINOGEN 0.2 07/22/2016 1142   NITRITE POSITIVE (A) 08/29/2016 0252   LEUKOCYTESUR LARGE (A) 08/29/2016 0252   Sepsis Labs Invalid input(s): PROCALCITONIN,  WBC,  LACTICIDVEN Microbiology Recent Results (from the past 240 hour(s))  Culture, Urine     Status: Abnormal   Collection Time: 08/29/16  2:50 AM  Result Value Ref Range Status   Specimen Description URINE, RANDOM  Final   Special Requests NONE  Final   Culture >=100,000 COLONIES/mL ESCHERICHIA COLI (A)  Final   Report Status 08/31/2016 FINAL  Final   Organism ID, Bacteria ESCHERICHIA COLI (A)  Final      Susceptibility   Escherichia coli - MIC*    AMPICILLIN 4 SENSITIVE Sensitive     CEFAZOLIN <=4 SENSITIVE Sensitive     CEFTRIAXONE <=1 SENSITIVE Sensitive     CIPROFLOXACIN <=0.25 SENSITIVE Sensitive     GENTAMICIN <=1 SENSITIVE Sensitive     IMIPENEM <=0.25 SENSITIVE Sensitive     NITROFURANTOIN <=16 SENSITIVE Sensitive     TRIMETH/SULFA <=20 SENSITIVE Sensitive     AMPICILLIN/SULBACTAM <=2 SENSITIVE Sensitive     PIP/TAZO <=4 SENSITIVE Sensitive     Extended ESBL NEGATIVE Sensitive     * >=100,000 COLONIES/mL ESCHERICHIA COLI     Time coordinating discharge: Over 30 minutes  SIGNED:   Birdie Hopes, MD  [facility] 08/31/2016, 10:47 AM Pager   If 7PM-7AM, please contact night-coverage [email or web address] Password [email or web address]

## 2016-08-31 NOTE — Progress Notes (Signed)
Pt d/c to home by car with family. Assessment stable. All questions answered. 

## 2016-08-31 NOTE — Procedures (Signed)
   Patient Name: Wesley, [name] Date: 08/15/2016 Gender: Male D.O.B: 05/11/1956 Age (years): 89 Referring Provider: Adriana Mccallum [name] Height (inches): 74 Interpreting Physician: Baird Lyons MD, ABSM Weight (lbs): 211 RPSGT: Laren Everts BMI: 27 MRN: [id number] Neck Size: 16.50 CLINICAL INFORMATION Sleep Study Type: NPSG Indication for sleep study: COPD, Excessive Daytime Sleepiness, Fatigue, Hypertension, Snoring Epworth Sleepiness Score: 14  SLEEP STUDY TECHNIQUE As per the AASM Manual for the Scoring of Sleep and Associated Events v2.3 (April 2016) with a hypopnea requiring 4% desaturations. The channels recorded and monitored were frontal, central and occipital EEG, electrooculogram (EOG), submentalis EMG (chin), nasal and oral airflow, thoracic and abdominal wall motion, anterior tibialis EMG, snore microphone, electrocardiogram, and pulse oximetry.  MEDICATIONS Patient's medications include: charted for review. Medications self-administered by patient during sleep study : hydrocodone- APAP, "chlorhexidine gluconate".  SLEEP ARCHITECTURE The study was initiated at 10:28:04 PM and ended at 4:52:07 AM. Sleep onset time was 77.4 minutes and the sleep efficiency was 22.0%. The total sleep time was 84.5 minutes. Stage REM latency was 154.0 minutes. The patient spent 40.24% of the night in stage N1 sleep, 29.59% in stage N2 sleep, 0.00% in stage N3 and 30.18% in REM. Alpha intrusion was absent. Supine sleep was 89.35%. Wake after sleep onset 222 minutes  RESPIRATORY PARAMETERS The overall apnea/hypopnea index (AHI) was 27.0 per hour. There were 30 total apneas, including 24 obstructive, 6 central and 0 mixed apneas. There were 8 hypopneas and 9 RERAs. The AHI during Stage REM sleep was 58.8 per hour. AHI while supine was 30.2 per hour. The mean oxygen saturation was 94.90%. The minimum SpO2 during sleep was 86.00%. Moderate snoring was noted during this  study.  CARDIAC DATA The 2 lead EKG demonstrated sinus rhythm. The mean heart rate was 60.14 beats per minute. Other EKG findings include: PVCs.  LEG MOVEMENT DATA The total PLMS were 0 with a resulting PLMS index of 0.00. Associated arousal with leg movement index was 0.0 .  IMPRESSIONS - Moderate obstructive sleep apnea occurred during this study (AHI = 27.0/h). - Significant difficulty initiating and maintaining sleep, with insufficient early sleep to meet protocol requirements for split CPAP titration. - No significant central sleep apnea occurred during this study (CAI = 4.3/h). - Mild oxygen desaturation was noted during this study (Min O2 = 86.00%). - The patient snored with Moderate snoring volume. - EKG findings include PVCs. - Clinically significant periodic limb movements did not occur during sleep. No significant associated arousals.  DIAGNOSIS - Obstructive Sleep Apnea (327.23 [G47.33 ICD-10]) - Difficulty initiating and maintaining sleep- insomnia  RECOMMENDATIONS - Therapeutic CPAP titration to determine optimal pressure required to alleviate sleep disordered breathing. - Positional therapy avoiding supine position during sleep. - Avoid alcohol, sedatives and other CNS depressants that may worsen sleep apnea and disrupt normal sleep architecture. - Additional tereatment for insomnia may be helpful, depending on response to CPAP. - Sleep hygiene should be reviewed to assess factors that may improve sleep quality. - Weight management and regular exercise should be initiated or continued if appropriate.  [Electronically signed] 08/31/2016 10:40 AM  Baird Lyons MD, ABSM Diplomate, American Board of Sleep Medicine   NPI: [id number] [place], American Board of Sleep Medicine  ELECTRONICALLY SIGNED ON:  08/31/2016, 10:32 AM [place] PH: [phone]   FX: [phone] [place]

## 2016-08-31 NOTE — Discharge Instructions (Signed)
Diabetes and Foot Care Diabetes may cause you to have problems because of poor blood supply (circulation) to your feet and legs. This may cause the skin on your feet to become thinner, break easier, and heal more slowly. Your skin may become dry, and the skin may peel and crack. You may also have nerve damage in your legs and feet causing decreased feeling in them. You may not notice minor injuries to your feet that could lead to infections or more serious problems. Taking care of your feet is one of the most important things you can do for yourself.  HOME CARE INSTRUCTIONS  Wear shoes at all times, even in the house. Do not go barefoot. Bare feet are easily injured.  Check your feet daily for blisters, cuts, and redness. If you cannot see the bottom of your feet, use a mirror or ask someone for help.  Wash your feet with warm water (do not use hot water) and mild soap. Then pat your feet and the areas between your toes until they are completely dry. Do not soak your feet as this can dry your skin.  Apply a moisturizing lotion or petroleum jelly (that does not contain alcohol and is unscented) to the skin on your feet and to dry, brittle toenails. Do not apply lotion between your toes.  Trim your toenails straight across. Do not dig under them or around the cuticle. File the edges of your nails with an emery board or nail file.  Do not cut corns or calluses or try to remove them with medicine.  Wear clean socks or stockings every day. Make sure they are not too tight. Do not wear knee-high stockings since they may decrease blood flow to your legs.  Wear shoes that fit properly and have enough cushioning. To break in new shoes, wear them for just a few hours a day. This prevents you from injuring your feet. Always look in your shoes before you put them on to be sure there are no objects inside.  Do not cross your legs. This may decrease the blood flow to your feet.  If you find a minor scrape,  cut, or break in the skin on your feet, keep it and the skin around it clean and dry. These areas may be cleansed with mild soap and water. Do not cleanse the area with peroxide, alcohol, or iodine.  When you remove an adhesive bandage, be sure not to damage the skin around it.  If you have a wound, look at it several times a day to make sure it is healing.  Do not use heating pads or hot water bottles. They may burn your skin. If you have lost feeling in your feet or legs, you may not know it is happening until it is too late.  Make sure your health care provider performs a complete foot exam at least annually or more often if you have foot problems. Report any cuts, sores, or bruises to your health care provider immediately. SEEK MEDICAL CARE IF:   You have an injury that is not healing.  You have cuts or breaks in the skin.  You have an ingrown nail.  You notice redness on your legs or feet.  You feel burning or tingling in your legs or feet.  You have pain or cramps in your legs and feet.  Your legs or feet are numb.  Your feet always feel cold. SEEK IMMEDIATE MEDICAL CARE IF:   There is increasing redness,  swelling, or pain in or around a wound.  There is a red line that goes up your leg.  Pus is coming from a wound.  You develop a fever or as directed by your health care provider.  You notice a bad smell coming from an ulcer or wound.   This information is not intended to replace advice given to you by your health care provider. Make sure you discuss any questions you have with your health care provider.   Document Released: 11/28/2000 Document Revised: 08/03/2013 Document Reviewed: 05/10/2013 Elsevier Interactive Patient Education 2016 [employer] Inc[employer] Blood Glucose Monitoring, Adult Monitoring your blood glucose (also know as blood sugar) helps you to manage your diabetes. It also helps you and your health care provider monitor your diabetes and determine how well  your treatment plan is working. WHY SHOULD YOU MONITOR YOUR BLOOD GLUCOSE?  It can help you understand how food, exercise, and medicine affect your blood glucose.  It allows you to know what your blood glucose is at any given moment. You can quickly tell if you are having low blood glucose (hypoglycemia) or high blood glucose (hyperglycemia).  It can help you and your health care provider know how to adjust your medicines.  It can help you understand how to manage an illness or adjust medicine for exercise. WHEN SHOULD YOU TEST? Your health care provider will help you decide how often you should check your blood glucose. This may depend on the type of diabetes you have, your diabetes control, or the types of medicines you are taking. Be sure to write down all of your blood glucose readings so that this information can be reviewed with your health care provider. See below for examples of testing times that your health care provider may suggest. Type 1 Diabetes  Test at least 2 times per day if your diabetes is well controlled, if you are using an insulin pump, or if you perform multiple daily injections.  If your diabetes is not well controlled or if you are sick, you may need to test more often.  It is a good idea to also test:  Before every insulin injection.  Before and after exercise.  Between meals and 2 hours after a meal.  Occasionally between 2:00 a.m. and 3:00 a.m. Type 2 Diabetes  If you are taking insulin, test at least 2 times per day. However, it is best to test before every insulin injection.  If you take medicines by mouth (orally), test 2 times a day.  If you are on a controlled diet, test once a day.  If your diabetes is not well controlled or if you are sick, you may need to monitor more often. HOW TO MONITOR YOUR BLOOD GLUCOSE Supplies Needed  Blood glucose meter.  Test strips for your meter. Each meter has its own strips. You must use the strips that go  with your own meter.  A pricking needle (lancet).  A device that holds the lancet (lancing device).  A journal or log book to write down your results. Procedure  Wash your hands with soap and water. Alcohol is not preferred.  Prick the side of your finger (not the tip) with the lancet.  Gently milk the finger until a small drop of blood appears.  Follow the instructions that come with your meter for inserting the test strip, applying blood to the strip, and using your blood glucose meter. Other Areas to Get Blood for Testing Some meters allow you to use  other areas of your body (other than your finger) to test your blood. These areas are called alternative sites. The most common alternative sites are:  The forearm.  The thigh.  The back area of the lower leg.  The palm of the hand. The blood flow in these areas is slower. Therefore, the blood glucose values you get may be delayed, and the numbers are different from what you would get from your fingers. Do not use alternative sites if you think you are having hypoglycemia. Your reading will not be accurate. Always use a finger if you are having hypoglycemia. Also, if you cannot feel your lows (hypoglycemia unawareness), always use your fingers for your blood glucose checks. ADDITIONAL TIPS FOR GLUCOSE MONITORING  Do not reuse lancets.  Always carry your supplies with you.  All blood glucose meters have a 24-hour "hotline" number to call if you have questions or need help.  Adjust (calibrate) your blood glucose meter with a control solution after finishing a few boxes of strips. BLOOD GLUCOSE RECORD KEEPING It is a good idea to keep a daily record or log of your blood glucose readings. Most glucose meters, if not all, keep your glucose records stored in the meter. Some meters come with the ability to download your records to your home computer. Keeping a record of your blood glucose readings is especially helpful if you are wanting  to look for patterns. Make notes to go along with the blood glucose readings because you might forget what happened at that exact time. Keeping good records helps you and your health care provider to work together to achieve good diabetes management.    This information is not intended to replace advice given to you by your health care provider. Make sure you discuss any questions you have with your health care provider.   Document Released: 12/04/2003 Document Revised: 12/22/2014 Document Reviewed: 04/25/2013 Elsevier Interactive Patient Education 2016 [place] Carbohydrate Counting for Diabetes Mellitus Carbohydrate counting is a method for keeping track of the amount of carbohydrates you eat. Eating carbohydrates naturally increases the level of sugar (glucose) in your blood, so it is important for you to know the amount that is okay for you to have in every meal. Carbohydrate counting helps keep the level of glucose in your blood within normal limits. The amount of carbohydrates allowed is different for every person. A dietitian can help you calculate the amount that is right for you. Once you know the amount of carbohydrates you can have, you can count the carbohydrates in the foods you want to eat. Carbohydrates are found in the following foods:  Grains, such as breads and cereals.  Dried beans and soy products.  Starchy vegetables, such as potatoes, peas, and corn.  Fruit and fruit juices.  Milk and yogurt.  Sweets and snack foods, such as cake, cookies, candy, chips, soft drinks, and fruit drinks. CARBOHYDRATE COUNTING There are two ways to count the carbohydrates in your food. You can use either of the methods or a combination of both. Reading the "Nutrition Facts" on [place] The "Nutrition Facts" is an area that is included on the labels of almost all packaged food and beverages in the [place]. It includes the serving size of that food or beverage and  information about the nutrients in each serving of the food, including the grams (g) of carbohydrate per serving.  Decide the number of servings of this food or beverage that you will be able to  eat or drink. Multiply that number of servings by the number of grams of carbohydrate that is listed on the label for that serving. The total will be the amount of carbohydrates you will be having when you eat or drink this food or beverage. Learning Standard Serving Sizes of Food When you eat food that is not packaged or does not include "Nutrition Facts" on the label, you need to measure the servings in order to count the amount of carbohydrates.A serving of most carbohydrate-rich foods contains about 15 g of carbohydrates. The following list includes serving sizes of carbohydrate-rich foods that provide 15 g ofcarbohydrate per serving:   1 slice of bread (1 oz) or 1 six-inch tortilla.    of a hamburger bun or English muffin.  4-6 crackers.   cup unsweetened dry cereal.    cup hot cereal.   cup rice or pasta.    cup mashed potatoes or  of a large baked potato.  1 cup fresh fruit or one small piece of fruit.    cup canned or frozen fruit or fruit juice.  1 cup milk.   cup plain fat-free yogurt or yogurt sweetened with artificial sweeteners.   cup cooked dried beans or starchy vegetable, such as peas, corn, or potatoes.  Decide the number of standard-size servings that you will eat. Multiply that number of servings by 15 (the grams of carbohydrates in that serving). For example, if you eat 2 cups of strawberries, you will have eaten 2 servings and 30 g of carbohydrates (2 servings x 15 g = 30 g). For foods such as soups and casseroles, in which more than one food is mixed in, you will need to count the carbohydrates in each food that is included. EXAMPLE OF CARBOHYDRATE COUNTING Sample Dinner  3 oz chicken breast.   cup of brown rice.   cup of corn.  1 cup milk.   1  cup strawberries with sugar-free whipped topping.  Carbohydrate Calculation Step 1: Identify the foods that contain carbohydrates:   Rice.   Corn.   Milk.   Strawberries. Step 2:Calculate the number of servings eaten of each:   2 servings of rice.   1 serving of corn.   1 serving of milk.   1 serving of strawberries. Step 3: Multiply each of those number of servings by 15 g:   2 servings of rice x 15 g = 30 g.   1 serving of corn x 15 g = 15 g.   1 serving of milk x 15 g = 15 g.   1 serving of strawberries x 15 g = 15 g. Step 4: Add together all of the amounts to find the total grams of carbohydrates eaten: 30 g + 15 g + 15 g + 15 g = 75 g.   This information is not intended to replace advice given to you by your health care provider. Make sure you discuss any questions you have with your health care provider.   Document Released: 12/01/2005 Document Revised: 12/22/2014 Document Reviewed: 10/28/2013 Elsevier Interactive Patient Education 2016 Elsevier Inc. Type 2 Diabetes Mellitus, Adult Type 2 diabetes mellitus, often simply referred to as type 2 diabetes, is a long-lasting (chronic) disease. In type 2 diabetes, the pancreas does not make enough insulin (a hormone), the cells are less responsive to the insulin that is made (insulin resistance), or both. Normally, insulin moves sugars from food into the tissue cells. The tissue cells use the sugars for energy. The  lack of insulin or the lack of normal response to insulin causes excess sugars to build up in the blood instead of going into the tissue cells. As a result, high blood sugar (hyperglycemia) develops. The effect of high sugar (glucose) levels can cause many complications. Type 2 diabetes was also previously called adult-onset diabetes, but it can occur at any age.  RISK FACTORS  A person is predisposed to developing type 2 diabetes if someone in the family has the disease and also has one or more of  the following primary risk factors:  Weight gain, or being overweight or obese.  An inactive lifestyle.  A history of consistently eating high-calorie foods. Maintaining a normal weight and regular physical activity can reduce the chance of developing type 2 diabetes. SYMPTOMS  A person with type 2 diabetes may not show symptoms initially. The symptoms of type 2 diabetes appear slowly. The symptoms include:  Increased thirst (polydipsia).  Increased urination (polyuria).  Increased urination during the night (nocturia).  Sudden or unexplained weight changes.  Frequent, recurring infections.  Tiredness (fatigue).  Weakness.  Vision changes, such as blurred vision.  Fruity smell to your breath.  Abdominal pain.  Nausea or vomiting.  Cuts or bruises which are slow to heal.  Tingling or numbness in the hands or feet.  An open skin wound (ulcer). DIAGNOSIS Type 2 diabetes is frequently not diagnosed until complications of diabetes are present. Type 2 diabetes is diagnosed when symptoms or complications are present and when blood glucose levels are increased. Your blood glucose level may be checked by one or more of the following blood tests:  A fasting blood glucose test. You will not be allowed to eat for at least 8 hours before a blood sample is taken.  A random blood glucose test. Your blood glucose is checked at any time of the day regardless of when you ate.  A hemoglobin A1c blood glucose test. A hemoglobin A1c test provides information about blood glucose control over the previous 3 months.  An oral glucose tolerance test (OGTT). Your blood glucose is measured after you have not eaten (fasted) for 2 hours and then after you drink a glucose-containing beverage. TREATMENT   You may need to take insulin or diabetes medicine daily to keep blood glucose levels in the desired range.  If you use insulin, you may need to adjust the dosage depending on the carbohydrates  that you eat with each meal or snack.  Lifestyle changes are recommended as part of your treatment. These may include:  Following an individualized diet plan developed by a nutritionist or dietitian.  Exercising daily. Your health care providers will set individualized treatment goals for you based on your age, your medicines, how long you have had diabetes, and any other medical conditions you have. Generally, the goal of treatment is to maintain the following blood glucose levels:  Before meals (preprandial): 80-130 mg/dL.  After meals (postprandial): below 180 mg/dL.  A1c: less than 6.5-7%. HOME CARE INSTRUCTIONS   Have your hemoglobin A1c level checked twice a year.  Perform daily blood glucose monitoring as directed by your health care provider.  Monitor urine ketones when you are ill and as directed by your health care provider.  Take your diabetes medicine or insulin as directed by your health care provider to maintain your blood glucose levels in the desired range.  Never run out of diabetes medicine or insulin. It is needed every day.  If you are using insulin, you  may need to adjust the amount of insulin given based on your intake of carbohydrates. Carbohydrates can raise blood glucose levels but need to be included in your diet. Carbohydrates provide vitamins, minerals, and fiber which are an essential part of a healthy diet. Carbohydrates are found in fruits, vegetables, whole grains, dairy products, legumes, and foods containing added sugars.  Eat healthy foods. You should make an appointment to see a registered dietitian to help you create an eating plan that is right for you.  Lose weight if you are overweight.  Carry a medical alert card or wear your medical alert jewelry.  Carry a 15-gram carbohydrate snack with you at all times to treat low blood glucose (hypoglycemia). Some examples of 15-gram carbohydrate snacks include:  Glucose tablets, 3 or 4.  Glucose  gel, 15-gram tube.  Raisins, 2 tablespoons (24 grams).  Jelly beans, 6.  Animal crackers, 8.  Regular pop, 4 ounces (120 mL).  Gummy treats, 9.  Recognize hypoglycemia. Hypoglycemia occurs with blood glucose levels of 70 mg/dL and below. The risk for hypoglycemia increases when fasting or skipping meals, during or after intense exercise, and during sleep. Hypoglycemia symptoms can include:  Tremors or shakes.  Decreased ability to concentrate.  Sweating.  Increased heart rate.  Headache.  Dry mouth.  Hunger.  Irritability.  Anxiety.  Restless sleep.  Altered speech or coordination.  Confusion.  Treat hypoglycemia promptly. If you are alert and able to safely swallow, follow the 15:15 rule:  Take 15-20 grams of rapid-acting glucose or carbohydrate. Rapid-acting options include glucose gel, glucose tablets, or 4 ounces (120 mL) of fruit juice, regular soda, or low-fat milk.  Check your blood glucose level 15 minutes after taking the glucose.  Take 15-20 grams more of glucose if the repeat blood glucose level is still 70 mg/dL or below.  Eat a meal or snack within 1 hour once blood glucose levels return to normal.  Be alert to feeling very thirsty and urinating more frequently than usual, which are early signs of hyperglycemia. An early awareness of hyperglycemia allows for prompt treatment. Treat hyperglycemia as directed by your health care provider.  Engage in at least 150 minutes of moderate-intensity physical activity a week, spread over at least 3 days of the week or as directed by your health care provider. In addition, you should engage in resistance exercise at least 2 times a week or as directed by your health care provider. Try to spend no more than 90 minutes at one time inactive.  Adjust your medicine and food intake as needed if you start a new exercise or sport.  Follow your sick-day plan anytime you are unable to eat or drink as usual.  Do not use  any tobacco products including cigarettes, chewing tobacco, or electronic cigarettes. If you need help quitting, ask your health care provider.  Limit alcohol intake to no more than 1 drink per day for nonpregnant women and 2 drinks per day for men. You should drink alcohol only when you are also eating food. Talk with your health care provider whether alcohol is safe for you. Tell your health care provider if you drink alcohol several times a week.  Keep all follow-up visits as directed by your health care provider. This is important.  Schedule an eye exam soon after the diagnosis of type 2 diabetes and then annually.  Perform daily skin and foot care. Examine your skin and feet daily for cuts, bruises, redness, nail problems, bleeding, blisters, or  sores. A foot exam by a health care provider should be done annually.  Brush your teeth and gums at least twice a day and floss at least once a day. Follow up with your dentist regularly.  Share your diabetes management plan with your workplace or school.  Keep your immunizations up to date. It is recommended that you receive a flu (influenza) vaccine every year. It is also recommended that you receive a pneumonia (pneumococcal) vaccine. If you are 51 years of age or older and have never received a pneumonia vaccine, this vaccine may be given as a series of two separate shots. Ask your health care provider which additional vaccines may be recommended.  Learn to manage stress.  Obtain ongoing diabetes education and support as needed.  Participate in or seek rehabilitation as needed to maintain or improve independence and quality of life. Request a physical or occupational therapy referral if you are having foot or hand numbness, or difficulties with grooming, dressing, eating, or physical activity. SEEK MEDICAL CARE IF:   You are unable to eat food or drink fluids for more than 6 hours.  You have nausea and vomiting for more than 6 hours.  Your  blood glucose level is over 240 mg/dL.  There is a change in mental status.  You develop an additional serious illness.  You have diarrhea for more than 6 hours.  You have been sick or have had a fever for a couple of days and are not getting better.  You have pain during any physical activity.  SEEK IMMEDIATE MEDICAL CARE IF:  You have difficulty breathing.  You have moderate to large ketone levels.   This information is not intended to replace advice given to you by your health care provider. Make sure you discuss any questions you have with your health care provider.   Document Released: 12/01/2005 Document Revised: 08/22/2015 Document Reviewed: 06/29/2012 Elsevier Interactive Patient Education 2016 Elsevier Inc. Urinary Tract Infection A urinary tract infection (UTI) can occur any place along the urinary tract. The tract includes the kidneys, ureters, bladder, and urethra. A type of germ called bacteria often causes a UTI. UTIs are often helped with antibiotic medicine.  HOME CARE   If given, take antibiotics as told by your doctor. Finish them even if you start to feel better.  Drink enough fluids to keep your pee (urine) clear or pale yellow.  Avoid tea, drinks with caffeine, and bubbly (carbonated) drinks.  Pee often. Avoid holding your pee in for a long time.  Pee before and after having sex (intercourse).  Wipe from front to back after you poop (bowel movement) if you are a woman. Use each tissue only once. GET HELP RIGHT AWAY IF:   You have back pain.  You have lower belly (abdominal) pain.  You have chills.  You feel sick to your stomach (nauseous).  You throw up (vomit).  Your burning or discomfort with peeing does not go away.  You have a fever.  Your symptoms are not better in 3 days. MAKE SURE YOU:   Understand these instructions.  Will watch your condition.  Will get help right away if you are not doing well or get worse.   This  information is not intended to replace advice given to you by your health care provider. Make sure you discuss any questions you have with your health care provider.   Document Released: 05/19/2008 Document Revised: 12/22/2014 Document Reviewed: 07/01/2012 Elsevier Interactive Patient Education [employer].  Transient Ischemic Attack A transient ischemic attack (TIA) is a "warning stroke" that causes stroke-like symptoms. Unlike a stroke, a TIA does not cause permanent damage to the brain. The symptoms of a TIA can happen very fast and do not last long. It is important to know the symptoms of a TIA and what to do. This can help prevent a major stroke or death. CAUSES  A TIA is caused by a temporary blockage in an artery in the brain or neck (carotid artery). The blockage does not allow the brain to get the blood supply it needs and can cause different symptoms. The blockage can be caused by either:  A blood clot.  Fatty buildup (plaque) in a neck or brain artery. RISK FACTORS  High blood pressure (hypertension).  High cholesterol.  Diabetes mellitus.  Heart disease.  The buildup of plaque in the blood vessels (peripheral artery disease or atherosclerosis).  The buildup of plaque in the blood vessels that provide blood and oxygen to the brain (carotid artery stenosis).  An abnormal heart rhythm (atrial fibrillation).  Obesity.  Using any tobacco products, including cigarettes, chewing tobacco, or electronic cigarettes.  Taking oral contraceptives, especially in combination with using tobacco.  Physical inactivity.  A diet high in fats, salt (sodium), and calories.  Excessive alcohol use.  Use of illegal drugs (especially cocaine and methamphetamine).  Being male.  Being African American.  Being over the age of 25 years.  Family history of stroke.  Previous history of blood clots, stroke, TIA, or heart attack.  Sickle cell disease. SIGNS AND SYMPTOMS  TIA  symptoms are the same as a stroke but are temporary. These symptoms usually develop suddenly, or may be newly present upon waking from sleep:  Sudden weakness or numbness of the face, arm, or leg, especially on one side of the body.  Sudden trouble walking or difficulty moving arms or legs.  Sudden confusion.  Sudden personality changes.  Trouble speaking (aphasia) or understanding.  Difficulty swallowing.  Sudden trouble seeing in one or both eyes.  Double vision.  Dizziness.  Loss of balance or coordination.  Sudden severe headache with no known cause.  Trouble reading or writing.  Loss of bowel or bladder control.  Loss of consciousness. DIAGNOSIS  Your health care provider may be able to determine the presence or absence of a TIA based on your symptoms, history, and physical exam. CT scan of the brain is usually performed to help identify a TIA. Other tests may include:  Electrocardiography (ECG).  Continuous heart monitoring.  Echocardiography.  Carotid ultrasonography.  MRI.  A scan of the brain circulation.  Blood tests. TREATMENT  Since the symptoms of TIA are the same as a stroke, it is important to seek treatment as soon as possible. You may need a medicine to dissolve a blood clot (thrombolytic) if that is the cause of the TIA. This medicine cannot be given if too much time has passed. Treatment may also include:   Rest, oxygen, fluids through an IV tube, and medicines to thin the blood (anticoagulants).  Measures will be taken to prevent short-term and long-term complications, including infection from breathing foreign material into the lungs (aspiration pneumonia), blood clots in the legs, and falls.  Procedures to either remove plaque in the carotid arteries or dilate carotid arteries that have narrowed due to plaque. Those procedures are:  Carotid endarterectomy.  Carotid angioplasty and stenting.  Medicines and diet may be used to address  diabetes, high blood pressure,  and other underlying risk factors. HOME CARE INSTRUCTIONS   Take medicines only as directed by your health care provider. Follow the directions carefully. Medicines may be used to control risk factors for a stroke. Be sure you understand all your medicine instructions.  You may be told to take aspirin or the anticoagulant warfarin. Warfarin needs to be taken exactly as instructed.  Taking too much or too little warfarin is dangerous. Too much warfarin increases the risk of bleeding. Too little warfarin continues to allow the risk for blood clots. While taking warfarin, you will need to have regular blood tests to measure your blood clotting time. A PT blood test measures how long it takes for blood to clot. Your PT is used to calculate another value called an INR. Your PT and INR help your health care provider to adjust your dose of warfarin. The dose can change for many reasons. It is critically important that you take warfarin exactly as prescribed.  Many foods, especially foods high in vitamin K can interfere with warfarin and affect the PT and INR. Foods high in vitamin K include spinach, kale, broccoli, cabbage, collard and turnip greens, Brussels sprouts, peas, cauliflower, seaweed, and parsley, as well as beef and pork liver, green tea, and soybean oil. You should eat a consistent amount of foods high in vitamin K. Avoid major changes in your diet, or notify your health care provider before changing your diet. Arrange a visit with a dietitian to answer your questions.  Many medicines can interfere with warfarin and affect the PT and INR. You must tell your health care provider about any and all medicines you take; this includes all vitamins and supplements. Be especially cautious with aspirin and anti-inflammatory medicines. Do not take or discontinue any prescribed or over-the-counter medicine except on the advice of your health care provider or  pharmacist.  Warfarin can have side effects, such as excessive bruising or bleeding. You will need to hold pressure over cuts for longer than usual. Your health care provider or pharmacist will discuss other potential side effects.  Avoid sports or activities that may cause injury or bleeding.  Be careful when shaving, flossing your teeth, or handling sharp objects.  Alcohol can change the body's ability to handle warfarin. It is best to avoid alcoholic drinks or consume only very small amounts while taking warfarin. Notify your health care provider if you change your alcohol intake.  Notify your dentist or other health care providers before procedures.  Eat a diet that includes 5 or more servings of fruits and vegetables each day. This may reduce the risk of stroke. Certain diets may be prescribed to address high blood pressure, high cholesterol, diabetes, or obesity.  A diet low in sodium, saturated fat, trans fat, and cholesterol is recommended to manage high blood pressure.  A diet low in saturated fat, trans fat, and cholesterol, and high in fiber may control cholesterol levels.  A controlled-carbohydrate, controlled-sugar diet is recommended to manage diabetes.  A reduced-calorie diet that is low in sodium, saturated fat, trans fat, and cholesterol is recommended to manage obesity.  Maintain a healthy weight.  Stay physically active. It is recommended that you get at least 30 minutes of activity on most or all days.  Do not use any tobacco products, including cigarettes, chewing tobacco, or electronic cigarettes. If you need help quitting, ask your health care provider.  Limit alcohol intake to no more than 1 drink per day for nonpregnant women and 2 drinks  per day for men. One drink equals 12 ounces of beer, 5 ounces of wine, or 1 ounces of hard liquor.  Do not abuse drugs.  A safe home environment is important to reduce the risk of falls. Your health care provider may arrange  for specialists to evaluate your home. Having grab bars in the bedroom and bathroom is often important. Your health care provider may arrange for equipment to be used at home, such as raised toilets and a seat for the shower.  Follow all instructions for follow-up with your health care provider. This is very important. This includes any referrals and lab tests. Proper follow-up can prevent a stroke or another TIA from occurring. PREVENTION  The risk of a TIA can be decreased by appropriately treating high blood pressure, high cholesterol, diabetes, heart disease, and obesity, and by quitting smoking, limiting alcohol, and staying physically active. SEEK MEDICAL CARE IF:  You have personality changes.  You have difficulty swallowing.  You are seeing double.  You have dizziness.  You have a fever. SEEK IMMEDIATE MEDICAL CARE IF:  Any of the following symptoms may represent a serious problem that is an emergency. Do not wait to see if the symptoms will go away. Get medical help right away. Call your local emergency services (911 in [place] Do not drive yourself to the hospital.  You have sudden weakness or numbness of the face, arm, or leg, especially on one side of the body.  You have sudden trouble walking or difficulty moving arms or legs.  You have sudden confusion.  You have trouble speaking (aphasia) or understanding.  You have sudden trouble seeing in one or both eyes.  You have a loss of balance or coordination.  You have a sudden, severe headache with no known cause.  You have new chest pain or an irregular heartbeat.  You have a partial or total loss of consciousness. MAKE SURE YOU:   Understand these instructions.  Will watch your condition.  Will get help right away if you are not doing well or get worse.   This information is not intended to replace advice given to you by your health care provider. Make sure you discuss any questions you have with your health care  provider.   Document Released: 09/10/2005 Document Revised: 12/22/2014 Document Reviewed: 03/08/2014 Elsevier Interactive Patient Education [employer].

## 2016-09-01 MED FILL — ATORVASTATIN 40 MG TABLET: 40 | 30 days supply | Qty: 30 | Fill #0

## 2016-09-01 MED FILL — CEFUROXIME AXETIL 500 MG TA: 500 | 5 days supply | Qty: 10 | Fill #0

## 2016-09-01 MED FILL — metFORMIN HCL 500 MG TABS: 500 | 30 days supply | Qty: 60 | Fill #0

## 2016-09-02 ENCOUNTER — Ambulatory Visit: Payer: Medicaid Other | Attending: Family Medicine | Admitting: Physician Assistant

## 2016-09-02 ENCOUNTER — Encounter: Payer: Self-pay | Admitting: Physician Assistant

## 2016-09-02 VITALS — BP 108/75 | HR 60 | Temp 98.2°F | Wt 207.4 lb

## 2016-09-02 DIAGNOSIS — Z09 Encounter for follow-up examination after completed treatment for conditions other than malignant neoplasm: Secondary | ICD-10-CM | POA: Diagnosis not present

## 2016-09-02 DIAGNOSIS — R7309 Other abnormal glucose: Secondary | ICD-10-CM | POA: Diagnosis not present

## 2016-09-02 DIAGNOSIS — R2990 Unspecified symptoms and signs involving the nervous system: Secondary | ICD-10-CM

## 2016-09-02 LAB — GLUCOSE, POCT (MANUAL RESULT ENTRY): POC Glucose: 153 mg/dl — AB (ref 70–99)

## 2016-09-02 MED ORDER — GLUCOSE BLOOD VI STRP: ORAL_STRIP | 12 refills | Status: DC

## 2016-09-02 MED ORDER — CETIRIZINE HCL 10 MG PO TABS: 10.0000 mg | ORAL_TABLET | Freq: Every day | ORAL | 11 refills | Status: DC

## 2016-09-02 MED ORDER — TRUEPLUS LANCETS 28G MISC: 100.0000 | Freq: Four times a day (QID) | 3 refills | Status: DC

## 2016-09-02 MED ORDER — FLUTICASONE PROPIONATE 50 MCG/ACT NA SUSP: 2.0000 | Freq: Every day | NASAL | 6 refills | Status: DC

## 2016-09-02 MED ORDER — ACCU-CHEK AVIVA PLUS W/DEVICE KIT: 1.0000 | PACK | Freq: Four times a day (QID) | 0 refills | Status: DC

## 2016-09-02 MED ORDER — ACCU-CHEK SOFT TOUCH LANCETS MISC: 12 refills | Status: DC

## 2016-09-02 MED ORDER — TRUE METRIX METER W/DEVICE KIT: 1.0000 | PACK | Freq: Four times a day (QID) | 0 refills | Status: DC

## 2016-09-02 MED FILL — ACCU-CHEK AVIVA PLUS TEST S: 25 days supply | Qty: 100 | Fill #0

## 2016-09-02 MED FILL — ACCU-CHEK AVIVA PLUS METER: W/DEVICE | 1 days supply | Qty: 1 | Fill #0

## 2016-09-02 MED FILL — ACCU-CHEK SOFTCLIX LANCETS: 25 days supply | Qty: 100 | Fill #0

## 2016-09-02 MED FILL — FLUTICASONE PROP 50 MCG SPR: 50 | 30 days supply | Qty: 16 | Fill #0

## 2016-09-02 NOTE — Progress Notes (Signed)
Pt states he is here today for the following:  Hospitalization follow up for Cerebral Infraction; DM;HTN

## 2016-09-02 NOTE — Addendum Note (Signed)
Addended byZettie Pho S on: 09/02/2016 02:45 PM   Modules accepted: Orders
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
## 2016-09-08 ENCOUNTER — Encounter: Payer: Self-pay | Admitting: Physical Therapy

## 2016-09-08 ENCOUNTER — Ambulatory Visit: Payer: Medicaid Other | Attending: Family Medicine | Admitting: Physical Therapy

## 2016-09-08 DIAGNOSIS — I69354 Hemiplegia and hemiparesis following cerebral infarction affecting left non-dominant side: Secondary | ICD-10-CM | POA: Diagnosis present

## 2016-09-08 DIAGNOSIS — R2689 Other abnormalities of gait and mobility: Secondary | ICD-10-CM

## 2016-09-08 DIAGNOSIS — R2681 Unsteadiness on feet: Secondary | ICD-10-CM | POA: Insufficient documentation

## 2016-09-08 DIAGNOSIS — M6281 Muscle weakness (generalized): Secondary | ICD-10-CM | POA: Insufficient documentation
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
## 2016-09-16 ENCOUNTER — Emergency Department (HOSPITAL_COMMUNITY): Payer: Medicaid Other

## 2016-09-16 ENCOUNTER — Encounter (HOSPITAL_COMMUNITY): Payer: Self-pay | Admitting: Emergency Medicine

## 2016-09-16 ENCOUNTER — Inpatient Hospital Stay (HOSPITAL_COMMUNITY)
Admission: EM | Admit: 2016-09-16 | Discharge: 2016-09-18 | DRG: 690 | Disposition: A | Discharge status: Home / Self Care | Payer: Medicaid Other | Attending: Internal Medicine | Admitting: Internal Medicine

## 2016-09-16 DIAGNOSIS — N390 Urinary tract infection, site not specified: Secondary | ICD-10-CM | POA: Diagnosis present

## 2016-09-16 DIAGNOSIS — Z7982 Long term (current) use of aspirin: Secondary | ICD-10-CM

## 2016-09-16 DIAGNOSIS — I10 Essential (primary) hypertension: Secondary | ICD-10-CM | POA: Diagnosis present

## 2016-09-16 DIAGNOSIS — I252 Old myocardial infarction: Secondary | ICD-10-CM | POA: Diagnosis not present

## 2016-09-16 DIAGNOSIS — Z8673 Personal history of transient ischemic attack (TIA), and cerebral infarction without residual deficits: Secondary | ICD-10-CM | POA: Diagnosis not present

## 2016-09-16 DIAGNOSIS — Z950 Presence of cardiac pacemaker: Secondary | ICD-10-CM | POA: Diagnosis not present

## 2016-09-16 DIAGNOSIS — J441 Chronic obstructive pulmonary disease with (acute) exacerbation: Secondary | ICD-10-CM | POA: Diagnosis present

## 2016-09-16 DIAGNOSIS — R531 Weakness: Secondary | ICD-10-CM

## 2016-09-16 DIAGNOSIS — Z880 Allergy status to penicillin: Secondary | ICD-10-CM

## 2016-09-16 DIAGNOSIS — Z7984 Long term (current) use of oral hypoglycemic drugs: Secondary | ICD-10-CM

## 2016-09-16 DIAGNOSIS — Z87891 Personal history of nicotine dependence: Secondary | ICD-10-CM

## 2016-09-16 DIAGNOSIS — F5104 Psychophysiologic insomnia: Secondary | ICD-10-CM | POA: Diagnosis present

## 2016-09-16 DIAGNOSIS — R42 Dizziness and giddiness: Secondary | ICD-10-CM | POA: Diagnosis present

## 2016-09-16 DIAGNOSIS — Z79899 Other long term (current) drug therapy: Secondary | ICD-10-CM

## 2016-09-16 DIAGNOSIS — Z87440 Personal history of urinary (tract) infections: Secondary | ICD-10-CM | POA: Diagnosis not present

## 2016-09-16 DIAGNOSIS — N3000 Acute cystitis without hematuria: Secondary | ICD-10-CM | POA: Diagnosis not present

## 2016-09-16 DIAGNOSIS — B9620 Unspecified Escherichia coli [E. coli] as the cause of diseases classified elsewhere: Secondary | ICD-10-CM | POA: Diagnosis present

## 2016-09-16 DIAGNOSIS — E119 Type 2 diabetes mellitus without complications: Secondary | ICD-10-CM | POA: Diagnosis present

## 2016-09-16 DIAGNOSIS — N400 Enlarged prostate without lower urinary tract symptoms: Secondary | ICD-10-CM | POA: Diagnosis present

## 2016-09-16 DIAGNOSIS — I2510 Atherosclerotic heart disease of native coronary artery without angina pectoris: Secondary | ICD-10-CM | POA: Diagnosis present

## 2016-09-16 DIAGNOSIS — K219 Gastro-esophageal reflux disease without esophagitis: Secondary | ICD-10-CM | POA: Diagnosis present

## 2016-09-16 DIAGNOSIS — E785 Hyperlipidemia, unspecified: Secondary | ICD-10-CM | POA: Diagnosis present

## 2016-09-16 HISTORY — DX: Cerebral infarction, unspecified: I63.9

## 2016-09-16 LAB — CBC WITH DIFFERENTIAL/PLATELET
BASOS PCT: 1 %
Basophils Absolute: 0 10*3/uL (ref 0.0–0.1)
EOS ABS: 0.2 10*3/uL (ref 0.0–0.7)
EOS PCT: 3 %
HEMATOCRIT: 43.9 % (ref 39.0–52.0)
HEMOGLOBIN: 14.1 g/dL (ref 13.0–17.0)
Lymphocytes Relative: 38 %
Lymphs Abs: 2.2 10*3/uL (ref 0.7–4.0)
MCH: 27.1 pg (ref 26.0–34.0)
MCHC: 32.1 g/dL (ref 30.0–36.0)
MCV: 84.4 fL (ref 78.0–100.0)
MONO ABS: 0.4 10*3/uL (ref 0.1–1.0)
MONOS PCT: 7 %
NEUTROS PCT: 51 %
Neutro Abs: 2.9 10*3/uL (ref 1.7–7.7)
Platelets: 217 10*3/uL (ref 150–400)
RBC: 5.2 MIL/uL (ref 4.22–5.81)
RDW: 14 % (ref 11.5–15.5)
WBC: 5.8 10*3/uL (ref 4.0–10.5)

## 2016-09-16 LAB — BASIC METABOLIC PANEL
Anion gap: 7 (ref 5–15)
BUN: 17 mg/dL (ref 6–20)
CALCIUM: 9.4 mg/dL (ref 8.9–10.3)
CO2: 27 mmol/L (ref 22–32)
CREATININE: 0.76 mg/dL (ref 0.61–1.24)
Chloride: 104 mmol/L (ref 101–111)
GFR calc non Af Amer: >60 mL/min (ref >60)
Glucose, Bld: 100 mg/dL — ABNORMAL HIGH (ref 65–99)
Potassium: 4 mmol/L (ref 3.5–5.1)
SODIUM: 138 mmol/L (ref 135–145)

## 2016-09-16 LAB — URINALYSIS, ROUTINE W REFLEX MICROSCOPIC
BILIRUBIN URINE: NEGATIVE
Glucose, UA: NEGATIVE mg/dL
HGB URINE DIPSTICK: NEGATIVE
KETONES UR: NEGATIVE mg/dL
NITRITE: NEGATIVE
Protein, ur: NEGATIVE mg/dL
Specific Gravity, Urine: 1.023 (ref 1.005–1.030)
pH: 5 (ref 5.0–8.0)

## 2016-09-16 LAB — GLUCOSE, CAPILLARY: GLUCOSE-CAPILLARY: 88 mg/dL (ref 65–99)

## 2016-09-16 LAB — URINE MICROSCOPIC-ADD ON: RBC / HPF: NONE SEEN RBC/hpf (ref 0–5)

## 2016-09-16 MED ORDER — ONDANSETRON HCL 4 MG/2ML IJ SOLN: 4.0000 mg | Freq: Three times a day (TID) | INTRAMUSCULAR | Status: AC | PRN

## 2016-09-16 MED ORDER — ALBUTEROL SULFATE (2.5 MG/3ML) 0.083% IN NEBU: 5.0000 mg | INHALATION_SOLUTION | Freq: Once | RESPIRATORY_TRACT | Status: DC

## 2016-09-16 MED ORDER — ALBUTEROL SULFATE (2.5 MG/3ML) 0.083% IN NEBU: 3.0000 mL | INHALATION_SOLUTION | Freq: Four times a day (QID) | RESPIRATORY_TRACT | Status: DC | PRN

## 2016-09-16 MED ORDER — IPRATROPIUM-ALBUTEROL 0.5-2.5 (3) MG/3ML IN SOLN
3.0000 mL | Freq: Once | RESPIRATORY_TRACT | Status: AC
  Administered 2016-09-16: 3 mL via RESPIRATORY_TRACT
  Filled 2016-09-16: qty 3

## 2016-09-16 MED ORDER — IPRATROPIUM-ALBUTEROL 0.5-2.5 (3) MG/3ML IN SOLN: 3.0000 mL | Freq: Three times a day (TID) | RESPIRATORY_TRACT | Status: DC

## 2016-09-16 MED ORDER — PANTOPRAZOLE SODIUM 40 MG PO TBEC
40.0000 mg | DELAYED_RELEASE_TABLET | Freq: Every day | ORAL | Status: DC
  Administered 2016-09-17 – 2016-09-18 (×2): 40 mg via ORAL
  Filled 2016-09-16 (×2): qty 1

## 2016-09-16 MED ORDER — ASPIRIN 325 MG PO TABS
325.0000 mg | ORAL_TABLET | Freq: Every day | ORAL | Status: DC
  Administered 2016-09-17 – 2016-09-18 (×2): 325 mg via ORAL
  Filled 2016-09-16 (×2): qty 1

## 2016-09-16 MED ORDER — TAMSULOSIN HCL 0.4 MG PO CAPS
0.8000 mg | ORAL_CAPSULE | Freq: Every day | ORAL | Status: DC
  Administered 2016-09-16 – 2016-09-17 (×2): 0.8 mg via ORAL
  Filled 2016-09-16 (×2): qty 2

## 2016-09-16 MED ORDER — DEXTROSE 5 % IV SOLN
1.0000 g | Freq: Once | INTRAVENOUS | Status: AC
  Administered 2016-09-16: 1 g via INTRAVENOUS
  Filled 2016-09-16: qty 10

## 2016-09-16 MED ORDER — SODIUM CHLORIDE 0.9 % IV BOLUS (SEPSIS)
500.0000 mL | Freq: Once | INTRAVENOUS | Status: AC
  Administered 2016-09-16: 500 mL via INTRAVENOUS

## 2016-09-16 MED ORDER — FLUTICASONE PROPIONATE 50 MCG/ACT NA SUSP
2.0000 | Freq: Every day | NASAL | Status: DC | PRN
  Filled 2016-09-16: qty 16

## 2016-09-16 MED ORDER — ENOXAPARIN SODIUM 60 MG/0.6ML ~~LOC~~ SOLN
50.0000 mg | SUBCUTANEOUS | Status: DC
  Administered 2016-09-16: 50 mg via SUBCUTANEOUS
  Filled 2016-09-16: qty 0.6

## 2016-09-16 MED ORDER — LISINOPRIL 20 MG PO TABS
40.0000 mg | ORAL_TABLET | Freq: Every day | ORAL | Status: DC
  Administered 2016-09-17 – 2016-09-18 (×2): 40 mg via ORAL
  Filled 2016-09-16 (×2): qty 2

## 2016-09-16 MED ORDER — ATORVASTATIN CALCIUM 40 MG PO TABS
40.0000 mg | ORAL_TABLET | Freq: Every day | ORAL | Status: DC
  Administered 2016-09-17: 40 mg via ORAL
  Filled 2016-09-16: qty 1

## 2016-09-16 MED ORDER — SERTRALINE HCL 50 MG PO TABS
100.0000 mg | ORAL_TABLET | Freq: Every day | ORAL | Status: DC
  Administered 2016-09-17 – 2016-09-18 (×2): 100 mg via ORAL
  Filled 2016-09-16 (×2): qty 2

## 2016-09-16 MED ORDER — SODIUM CHLORIDE 0.9 % IV SOLN
INTRAVENOUS | Status: AC
  Administered 2016-09-16: 18:00:00 via INTRAVENOUS

## 2016-09-16 MED ORDER — ALBUTEROL SULFATE (2.5 MG/3ML) 0.083% IN NEBU: 5.0000 mg | INHALATION_SOLUTION | RESPIRATORY_TRACT | Status: DC | PRN

## 2016-09-16 MED ORDER — LORATADINE 10 MG PO TABS
10.0000 mg | ORAL_TABLET | Freq: Every day | ORAL | Status: DC
  Administered 2016-09-17 – 2016-09-18 (×2): 10 mg via ORAL
  Filled 2016-09-16 (×2): qty 1

## 2016-09-16 MED ORDER — LIP MEDEX EX OINT
TOPICAL_OINTMENT | CUTANEOUS | Status: AC
  Administered 2016-09-16: 22:00:00
  Filled 2016-09-16: qty 7

## 2016-09-16 MED ORDER — DEXTROSE 5 % IV SOLN
1.0000 g | INTRAVENOUS | Status: DC
  Administered 2016-09-17: 1 g via INTRAVENOUS
  Filled 2016-09-16: qty 10

## 2016-09-16 MED ORDER — IPRATROPIUM-ALBUTEROL 0.5-2.5 (3) MG/3ML IN SOLN
3.0000 mL | Freq: Four times a day (QID) | RESPIRATORY_TRACT | Status: DC
  Administered 2016-09-16: 3 mL via RESPIRATORY_TRACT
  Filled 2016-09-16: qty 3

## 2016-09-16 MED ORDER — SODIUM CHLORIDE 0.9% FLUSH
3.0000 mL | Freq: Two times a day (BID) | INTRAVENOUS | Status: DC
Start: 2016-09-16 — End: 2016-09-18
  Administered 2016-09-17 – 2016-09-18 (×3): 3 mL via INTRAVENOUS

## 2016-09-16 MED FILL — METOPROLOL TARTRATE 25 MG T: 25 | 30 days supply | Qty: 60 | Fill #2

## 2016-09-16 MED FILL — PANTOPRAZOLE SOD DR 20 MG T: 20 | 15 days supply | Qty: 30 | Fill #1

## 2016-09-16 MED FILL — MELOXICAM 15 MG TABLET: 15 | 30 days supply | Qty: 30 | Fill #1
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
## 2016-09-16 NOTE — ED Triage Notes (Signed)
Pt and wife reports that he has c/o burning on urination, shortness of breath and weakness x 2-3 days. Reports hx of cough and shaking for a few weeks.Wesley [name] Hx of same sx with recent. UTI. Pt is alert, orient and ambulatory. Denies chest pain. Hx CVA

## 2016-09-17 ENCOUNTER — Other Ambulatory Visit (HOSPITAL_COMMUNITY): Payer: Medicaid Other

## 2016-09-17 ENCOUNTER — Encounter (HOSPITAL_COMMUNITY): Payer: Self-pay

## 2016-09-17 DIAGNOSIS — I10 Essential (primary) hypertension: Secondary | ICD-10-CM

## 2016-09-17 DIAGNOSIS — N3000 Acute cystitis without hematuria: Secondary | ICD-10-CM

## 2016-09-17 DIAGNOSIS — Z8673 Personal history of transient ischemic attack (TIA), and cerebral infarction without residual deficits: Secondary | ICD-10-CM

## 2016-09-17 LAB — TROPONIN I: Troponin I: <0.03 ng/mL (ref <0.03)

## 2016-09-17 LAB — GLUCOSE, CAPILLARY: Glucose-Capillary: 107 mg/dL — ABNORMAL HIGH (ref 65–99)

## 2016-09-17 MED ORDER — SENNOSIDES-DOCUSATE SODIUM 8.6-50 MG PO TABS
2.0000 | ORAL_TABLET | Freq: Every evening | ORAL | Status: DC | PRN
  Administered 2016-09-17: 2 via ORAL
  Filled 2016-09-17: qty 2

## 2016-09-17 MED ORDER — MELOXICAM 15 MG PO TABS
15.0000 mg | ORAL_TABLET | Freq: Every day | ORAL | Status: DC
  Administered 2016-09-17 – 2016-09-18 (×2): 15 mg via ORAL
  Filled 2016-09-17 (×2): qty 1

## 2016-09-17 MED ORDER — ONDANSETRON HCL 4 MG/2ML IJ SOLN
4.0000 mg | Freq: Three times a day (TID) | INTRAMUSCULAR | Status: DC | PRN
  Administered 2016-09-17: 4 mg via INTRAVENOUS
  Filled 2016-09-17: qty 2

## 2016-09-17 MED ORDER — ENOXAPARIN SODIUM 40 MG/0.4ML ~~LOC~~ SOLN: 40.0000 mg | SUBCUTANEOUS | Status: DC

## 2016-09-17 MED ORDER — IPRATROPIUM-ALBUTEROL 0.5-2.5 (3) MG/3ML IN SOLN: 3.0000 mL | RESPIRATORY_TRACT | Status: DC | PRN

## 2016-09-17 MED ORDER — ENOXAPARIN SODIUM 40 MG/0.4ML ~~LOC~~ SOLN
40.0000 mg | SUBCUTANEOUS | Status: DC
  Administered 2016-09-17 – 2016-09-18 (×2): 40 mg via SUBCUTANEOUS
  Filled 2016-09-17 (×2): qty 0.4

## 2016-09-17 NOTE — Progress Notes (Signed)
PT Cancellation Note  Patient Details Name: Wesley [name] MRN: [id number] DOB: 06/01/1956   Cancelled Treatment:    Reason Eval/Treat Not Completed: Patient at undergoing test/unavailable   [facility] 09/17/2016, 11:30 AM

## 2016-09-17 NOTE — Progress Notes (Signed)
[facility] PROGRESS NOTE  Wesley [name] [id number] DOB: 1956/11/23 DOA: 09/16/2016  PCP: Minerva Ends, MD  Brief History/Interval Summary: 60 year old African-American male with a past medical history of recent CVA, hypertension, diabetes, hyperlipidemia, who was also recently treated for a urinary tract infection presented with generalized weakness, shortness of breath, dry cough and dysuria. He was found to have abnormal UA. He was hospitalized for further management.  Reason for Visit: Recurrent UTI  Consultants: None  Procedures: None  Antibiotics: Ceftriaxone   Subjective/Interval History: Patient is very taciturn. Doesn't really answer questions. His daughter and wife are at the bedside. Patient, however, does say that he is feeling better compared to how he was initially yesterday. Denies any difficulty breathing currently. Continues to have some burning sensation with urination, but that symptom is also getting better.  ROS: Denies any headaches.  Objective:  Vital Signs  Vitals:   09/16/16 2005 09/16/16 2116 09/17/16 0429 09/17/16 1135  BP: 132/82  133/87 131/74  Pulse: (!) 59  83 61  Resp: 16  16   Temp: 98.4 F (36.9 C)  98.4 F (36.9 C)   TempSrc: Oral  Oral   SpO2: 96% 98% 93% 95%  Weight:      Height:        Intake/Output Summary (Last 24 hours) at 09/17/16 1246 Last data filed at 09/17/16 0600  Gross per 24 hour  Intake          1173.33 ml  Output              800 ml  Net           373.33 ml   Filed Weights   09/16/16 1207 09/16/16 1809  Weight: 95.7 kg (211 lb) 94 kg (207 lb 3.7 oz)    General appearance: alert, cooperative, appears stated age and no distress Resp: Lungs are clear to auscultation bilaterally. No wheezing, rales or rhonchi. Cardio: regular rate and rhythm, S1, S2 normal, no murmur, click, rub or gallop GI: soft, non-tender; bowel sounds normal; no masses,  no organomegaly Extremities: extremities normal,  atraumatic, no cyanosis or edema Skin: Skin color, texture, turgor normal. No rashes or lesions Neurologic: Awake and alert. Oriented 3. Cranial nerves II-12 intact. Motor strength equal bilateral upper and lower extremities.  Lab Results:  Data Reviewed: I have personally reviewed following labs and imaging studies  CBC:  Recent Labs Lab 09/16/16 1428  WBC 5.8  NEUTROABS 2.9  HGB 14.1  HCT 43.9  MCV 84.4  PLT [id number]    Basic Metabolic Panel:  Recent Labs Lab 09/16/16 1428  NA 138  K 4.0  CL 104  CO2 27  GLUCOSE 100*  BUN 17  CREATININE 0.76  CALCIUM 9.4    GFR: Estimated Creatinine Clearance: 114.2 mL/min (by C-G formula based on SCr of 0.76 mg/dL).  CBG:  Recent Labs Lab 09/16/16 1820  GLUCAP 88     Radiology Studies: Dg Chest 2 View  Result Date: 09/16/2016 CLINICAL DATA:  Chest congestion. Fever. Cough. Shortness of breath. Weakness. 2-3 days duration. EXAM: CHEST  2 VIEW COMPARISON:  08/28/2016 FINDINGS: Pacemaker appears unchanged. Heart size is normal. Re- demonstration of emphysema with bullous disease at the right apex. Linear scar in the right mid lung laterally. No evidence of active infiltrate, mass, effusion or collapse. IMPRESSION: Emphysema with bullous disease at the right apex. No acute/active process identified. Electronically Signed   By: Nelson Chimes M.D.   On: 09/16/2016 12:53  Medications:  Scheduled: . albuterol  5 mg Nebulization Once  . aspirin  325 mg Oral Daily  . atorvastatin  40 mg Oral q1800  . cefTRIAXone (ROCEPHIN)  IV  1 g Intravenous Q24H  . lisinopril  40 mg Oral Daily  . loratadine  10 mg Oral Daily  . meloxicam  15 mg Oral Daily  . pantoprazole  40 mg Oral Daily  . sertraline  100 mg Oral Daily  . sodium chloride flush  3 mL Intravenous Q12H  . tamsulosin  0.8 mg Oral QPC supper   Continuous:  [id number], ipratropium-albuterol, ondansetron (ZOFRAN) IV  Assessment/Plan:  Active Problems:   UTI  (urinary tract infection)   COPD exacerbation (HCC)    Generalized weakness Most likely due to his urinary tract infection. Mobilize as tolerated. He is already feeling better.  Recurrent urinary tract infection. Patient was recently hospitalized for the same. He completed the course of his antibiotics, however, has developed symptoms yet again along with an abnormal UA. This could suggest failure of initial treatment. Repeat urine cultures have been ordered and are pending. Continue ceftriaxone for now.  Mild Acute COPD exacerbation Symptoms seem to have improved just with nebulizer treatments. He was not placed on steroids. Currently much improved. Changed to as needed.   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 Stable from a neurological standpoint. Patient's wife does mention that sometimes he has these episodes where he doesn't talk much. He then does not remember these episodes. Back in March or April of this year he had an episode where the wife felt as if he was having a seizure, but it was felt to be a syncopal event. Patient could not have MRI because of his pacemaker. However, patient does have an appointment with neurology on the 17th of this month. I have offered EEG to determine if there is any epileptiform activity accounting for his symptoms. Proceed with the same for now. Otherwise, do not anticipate any further inpatient workup.  New onset diabetes Mellitus Patient was recently started on metformin, which will be held inpatient.   Essential Hypertension Well controlled.   Hyperlipidemia Resume lipitor .   H/o BPH Could explain his recurrent UTI'S. Resume flomax. He'll benefit from seeing urology as outpatient.  ADDENDUM Called by the nurse stating that the patient was experiencing symptoms of indigestion, and then he was also complaining of some nausea and chest pain. EKG was ordered which showed a paced rhythm.  Troponin is pending. Patient was given Zofran with improvement in symptoms.  DVT Prophylaxis: Lovenox    Code Status: Full code  Family Communication: Discussed with the patient along with his wife and daughter.  Disposition Plan: Anticipate discharge home when medically improved.     LOS: 1 day   [facility] Pager [phone] 09/17/2016, 12:46 PM  If 7PM-7AM, please contact night-coverage at [email or web address], password [facility]

## 2016-09-17 NOTE — Progress Notes (Signed)
SATURATION QUALIFICATIONS: (This note is used to comply with regulatory documentation for home oxygen)  Patient Saturations on Room Air at Rest = 97%  Patient Saturations on Room Air while Ambulating = 95%  Patient Saturations on  Liters of oxygen while Ambulating = %  Please briefly explain why patient needs home oxygen: 

## 2016-09-17 NOTE — Progress Notes (Signed)
Pt c/o chest discomfort/burning. Pt states think is indigestion.  Visible distress and SOB with pain.  BP 131/74 HR 61 paced.  Nausea.  MD ordered, new orders given and followed.  EEG tech at bedside and will refer test until CP resolved.

## 2016-09-17 NOTE — Progress Notes (Signed)
Pt complaining of distention and inability to void. Bladder scan performed, 350 cc noted. Pt was able to void on own, 300 cc output. Continues to complain of distention. Stated that it may be due to his inability to have a bowel movement. Patient states "distention has made me lose my appetite." On call notified, senna order and administered. Will continue to monitor.

## 2016-09-17 NOTE — Hospital Discharge Follow-Up (Signed)
The patient is known to the [facility]. Met with him to discuss follow up with the [facility] however he has an appointment scheduled with Dr Adrian Blackwater at [facility] on 09/23/16 @ 1030 and he would like to keep that appointment and see Dr Adrian Blackwater. Appointment information placed on the AVS.  Sunday Spillers, RN CM informed that this CM was going to meet with the patient to discuss follow up.

## 2016-09-18 ENCOUNTER — Inpatient Hospital Stay (HOSPITAL_COMMUNITY)
Admission: EM | Admit: 2016-09-18 | Discharge: 2016-09-18 | Disposition: A | Discharge status: Home / Self Care | Payer: Medicaid Other | Source: Home / Self Care | Attending: Internal Medicine | Admitting: Internal Medicine

## 2016-09-18 DIAGNOSIS — R531 Weakness: Secondary | ICD-10-CM

## 2016-09-18 LAB — COMPREHENSIVE METABOLIC PANEL
ALBUMIN: 3.7 g/dL (ref 3.5–5.0)
ALT: 25 U/L (ref 17–63)
AST: 15 U/L (ref 15–41)
Alkaline Phosphatase: 88 U/L (ref 38–126)
Anion gap: 6 (ref 5–15)
BUN: 15 mg/dL (ref 6–20)
CHLORIDE: 108 mmol/L (ref 101–111)
CO2: 24 mmol/L (ref 22–32)
CREATININE: 0.74 mg/dL (ref 0.61–1.24)
Calcium: 9.1 mg/dL (ref 8.9–10.3)
GFR calc Af Amer: >60 mL/min (ref >60)
GLUCOSE: 126 mg/dL — AB (ref 65–99)
POTASSIUM: 4.1 mmol/L (ref 3.5–5.1)
SODIUM: 138 mmol/L (ref 135–145)
Total Bilirubin: 0.6 mg/dL (ref 0.3–1.2)
Total Protein: 6.9 g/dL (ref 6.5–8.1)

## 2016-09-18 LAB — CBC
HCT: 42.2 % (ref 39.0–52.0)
Hemoglobin: 13.3 g/dL (ref 13.0–17.0)
MCH: 26.6 pg (ref 26.0–34.0)
MCHC: 31.5 g/dL (ref 30.0–36.0)
MCV: 84.4 fL (ref 78.0–100.0)
PLATELETS: 199 10*3/uL (ref 150–400)
RBC: 5 MIL/uL (ref 4.22–5.81)
RDW: 14.1 % (ref 11.5–15.5)
WBC: 5.7 10*3/uL (ref 4.0–10.5)

## 2016-09-18 MED ORDER — CIPROFLOXACIN HCL 500 MG PO TABS: 500.0000 mg | ORAL_TABLET | Freq: Two times a day (BID) | ORAL | 0 refills | Status: DC

## 2016-09-18 MED ORDER — CIPROFLOXACIN HCL 500 MG PO TABS
500.0000 mg | ORAL_TABLET | Freq: Two times a day (BID) | ORAL | Status: DC
  Administered 2016-09-18: 500 mg via ORAL
  Filled 2016-09-18 (×2): qty 1

## 2016-09-18 MED ORDER — BISACODYL 5 MG PO TBEC
10.0000 mg | DELAYED_RELEASE_TABLET | Freq: Once | ORAL | Status: AC
  Administered 2016-09-18: 10 mg via ORAL
  Filled 2016-09-18: qty 2

## 2016-09-18 NOTE — Evaluation (Signed)
Physical Therapy Evaluation Patient Details Name: Wesley [name] MRN: [id number] DOB: 08/28/1956 Today's Date: 09/18/2016   History of Present Illness  Patient is a 60 y/o male with hx of CVA (Sept 2017), COPD, pacemaker, DM, substance abuse, depression, TB, MI, HTN, HLD presents with weakness. Dx of UTI.   Clinical Impression  Pt is independent with bed mobility, transfers, and ambulating 200' without an assistive device. He does report dizziness with supine to sit and sit to stand, dizziness resolved after ~30 seconds.  He had a 20 point systolic BP drop with supine to sit. He reports this has been an issue for ~1 year. See flow sheets for orthostatic BP readings. Encouraged family/pt to discuss orthostatic BP with primary care physician, they have an appointment early next week.     Follow Up Recommendations  none    Equipment Recommendations  None recommended by PT    Recommendations for Other Services       Precautions / Restrictions Precautions Precautions: Fall Precaution Comments: pt reports several falls in past year Restrictions Weight Bearing Restrictions: No      Mobility  Bed Mobility Overal bed mobility: Independent Bed Mobility: Supine to Sit;Sit to Supine     Supine to sit: Independent Sit to supine: Independent      Transfers Overall transfer level: Needs assistance Equipment used: None Transfers: Sit to/from Stand Sit to Stand: Supervision         General transfer comment: pt reported dizziness initially upon sitting and again upon standing, dizziness resolved after ~30 seconds,  no LOB, pt had 20 point systolic drop with supine to sit (see flow sheets)  Ambulation/Gait Ambulation/Gait assistance: Supervision Ambulation Distance (Feet): 200 Feet Assistive device: None Gait Pattern/deviations: WFL(Within Functional Limits)   Gait velocity interpretation: at or above normal speed for age/gender General Gait Details: no LOB, no  dizziness  Stairs            Wheelchair Mobility    Modified Rankin (Stroke Patients Only)       Balance Overall balance assessment: History of Falls                                           Pertinent Vitals/Pain Pain Assessment: No/denies pain    Home Living Family/patient expects to be discharged to:: Private residence Living Arrangements: Spouse/significant other Available Help at Discharge: Family;Available PRN/intermittently (wife works) Type of Home: House Home Access: Stairs to enter Entrance Stairs-Rails: Building surveyor of Steps: 3 Home Layout: One level Home Equipment: Cane - single point      Prior Function Level of Independence: Independent         Comments: drives, works in yard     Journalist, newspaper   Dominant Hand: Right    Extremity/Trunk Assessment   Upper Extremity Assessment: Overall WFL for tasks assessed           Lower Extremity Assessment: Overall WFL for tasks assessed         Communication   Communication: No difficulties  Cognition Arousal/Alertness: Awake/alert Behavior During Therapy: WFL for tasks assessed/performed Overall Cognitive Status: Within Functional Limits for tasks assessed                      General Comments      Exercises     Assessment/Plan    PT Assessment Patent does not  need any further PT services  PT Problem List            PT Treatment Interventions Functional mobility training    PT Goals (Current goals can be found in the Care Plan section)  Acute Rehab PT Goals Patient Stated Goal: to work in the yard PT Goal Formulation: With patient/family Time For Goal Achievement: 10/02/16 Potential to Achieve Goals: Good    Frequency     Barriers to discharge        Co-evaluation               End of Session Equipment Utilized During Treatment: Gait belt Activity Tolerance: Patient tolerated treatment well Patient left: in  bed;with call bell/phone within reach;with family/visitor present Nurse Communication: Mobility status         Time: [id number] PT Time Calculation (min) (ACUTE ONLY): 27 min   Charges:   PT Evaluation $PT Eval Low Complexity: 1 Procedure PT Treatments $Gait Training: 8-22 mins   PT G Codes:        Philomena Doheny 09/18/2016, 2:35 PM [phone]

## 2016-09-18 NOTE — Discharge Summary (Signed)
[facility]  Physician Discharge Summary   Patient ID: Wesley [name] MRN: [id number] DOB/AGE: [birth year] 60 y.o.  Admit date: 09/16/2016 Discharge date: 09/18/2016  PCP: Minerva Ends, MD  DISCHARGE DIAGNOSES:  Active Problems:   UTI (urinary tract infection)   COPD exacerbation (HCC)   RECOMMENDATIONS FOR OUTPATIENT FOLLOW UP: 1. Patient has been made an appointment to see gastroenterology for his acid reflux, poor appetite and occasional dysphagia 2. Patient has been encouraged to keep his appointment with the neurologist 3. Consider referral to urology as outpatient for recurrent UTI and BPH 4. Needs follow-up for orthostatic hypotension   DISCHARGE CONDITION: fair  Diet recommendation: As before  [facility]   09/16/16 1207 09/16/16 1809  Weight: 95.7 kg (211 lb) 94 kg (207 lb 3.7 oz)    INITIAL HISTORY: 60 year old African-American male with a past medical history of recent CVA, hypertension, diabetes, hyperlipidemia, who was also recently treated for a urinary tract infection presented with generalized weakness, shortness of breath, dry cough and dysuria. He was found to have abnormal UA. He was hospitalized for further management.  Consultations:  None  Procedures:  EEG was done which did not show any epileptiform activities  HOSPITAL COURSE:   Generalized weakness Most likely due to his urinary tract infection. Patient has significantly improved. He has ambulated without any difficulties.  Recurrent urinary tract infection. Patient was recently hospitalized for the same. He completed the course of his antibiotics, however, has developed symptoms yet again along with an abnormal UA. This could suggest failure of initial treatment. Urine culture was repeated and continued to show Escherichia coli. This time, he will be discharged on oral ciprofloxacin which has a better MIC.   Mild Acute COPD exacerbation Symptoms seem to have improved  just with nebulizer treatments. He was not placed on steroids. Currently much improved.   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 Stable from a neurological standpoint. Patient's wife does mention that sometimes he has these episodes where he doesn't talk much. He then does not remember these episodes. Back in March or April of this year he had an episode where the wife felt as if he was having a seizure, but it was felt to be a syncopal event. Patient could not have MRI because of his pacemaker. However, patient does have an appointment with neurology on the 17th of this month. EEG was done which did not show any epileptiform activity.   New onset diabetes Mellitus May resume his home medications   Essential Hypertension Well controlled.   Hyperlipidemia Resume lipitor .   H/o BPH Could explain his recurrent UTI'S. Resume flomax. He'll benefit from seeing urology as outpatient.  Acid reflux Patient did have an episode where he developed chest pain and developed nausea. He was given antiemetics and anti-acid agents, which relieved his symptoms. EKG did not show any acute changes. Troponin was normal. His symptoms appear to be GI in origin. Upon talking to him further, it appears that his acid reflux acts up sometimes and occasionally he has difficulty swallowing. In view of this, I have made him an appointment with gastroenterology next week. He may need to undergo upper endoscopy. He should continue his PPI. Avoid NSAIDs.  Patient was evaluated by physical therapy. He was noted to be mildly orthostatic. He did have a drop in his blood pressure. However, symptoms did not last for more than 30 seconds. Patient apparently has a history of same.  He has been told to get up slowly from sitting or lying position. Use TED stockings if symptoms persist and continue taking Flomax nightly basis. If the symptoms do not improve or get worse, he should see  his PCP.  Overall, patient has improved. He will be sent home on an alternative antibiotic. Other recommendations as above. Okay for discharge home today.    PERTINENT LABS:  The results of significant diagnostics from this hospitalization (including imaging, microbiology, ancillary and laboratory) are listed below for reference.    Microbiology: Recent Results (from the past 240 hour(s))  Urine culture     Status: Abnormal (Preliminary result)   Collection Time: 09/16/16  1:40 PM  Result Value Ref Range Status   Specimen Description URINE, RANDOM  Final   Special Requests NONE  Final   Culture >=100,000 COLONIES/mL ESCHERICHIA COLI (A)  Final   Report Status PENDING  Incomplete   Organism ID, Bacteria ESCHERICHIA COLI (A)  Final      Susceptibility   Escherichia coli - MIC*    AMPICILLIN 4 SENSITIVE Sensitive     CEFAZOLIN <=4 SENSITIVE Sensitive     CEFTRIAXONE <=1 SENSITIVE Sensitive     CIPROFLOXACIN <=0.25 SENSITIVE Sensitive     GENTAMICIN <=1 SENSITIVE Sensitive     IMIPENEM <=0.25 SENSITIVE Sensitive     NITROFURANTOIN <=16 SENSITIVE Sensitive     TRIMETH/SULFA <=20 SENSITIVE Sensitive     AMPICILLIN/SULBACTAM <=2 SENSITIVE Sensitive     PIP/TAZO <=4 SENSITIVE Sensitive     Extended ESBL NEGATIVE Sensitive     * >=100,000 COLONIES/mL ESCHERICHIA COLI     Labs: Basic Metabolic Panel:  Recent Labs Lab 09/16/16 1428 09/18/16 0509  NA 138 138  K 4.0 4.1  CL 104 108  CO2 27 24  GLUCOSE 100* 126*  BUN 17 15  CREATININE 0.76 0.74  CALCIUM 9.4 9.1   Liver Function Tests:  Recent Labs Lab 09/18/16 0509  AST 15  ALT 25  ALKPHOS 88  BILITOT 0.6  PROT 6.9  ALBUMIN 3.7   CBC:  Recent Labs Lab 09/16/16 1428 09/18/16 0509  WBC 5.8 5.7  NEUTROABS 2.9  --   HGB 14.1 13.3  HCT 43.9 42.2  MCV 84.4 84.4  PLT 217 199   Cardiac Enzymes:  Recent Labs Lab 09/17/16 1313  TROPONINI <0.03    CBG:  Recent Labs Lab 09/16/16 1820 09/17/16 1721    GLUCAP 88 107*     IMAGING STUDIES  Dg Chest 2 View  Result Date: 09/16/2016 CLINICAL DATA:  Chest congestion. Fever. Cough. Shortness of breath. Weakness. 2-3 days duration. EXAM: CHEST  2 VIEW COMPARISON:  08/28/2016 FINDINGS: Pacemaker appears unchanged. Heart size is normal. Re- demonstration of emphysema with bullous disease at the right apex. Linear scar in the right mid lung laterally. No evidence of active infiltrate, mass, effusion or collapse. IMPRESSION: Emphysema with bullous disease at the right apex. No acute/active process identified. Electronically Signed   By: Nelson Chimes M.D.   On: 09/16/2016 12:53     DISCHARGE EXAMINATION: Vitals:   09/17/16 1400 09/17/16 2050 09/18/16 0645 09/18/16 0800  BP: 122/69 115/66 115/81 116/81  Pulse: 64 65 64 60  Resp: _0 Temp: 97.9 F (36.6 C) 97.7 F (36.5 C) 98.4 F (36.9 C) 97.5 F (36.4 C)  TempSrc: Oral Oral Oral Oral  SpO2: 99% 96% 96% 95%  Weight:      Height:       General appearance:  alert, cooperative, appears stated age and no distress Resp: clear to auscultation bilaterally Cardio: regular rate and rhythm, S1, S2 normal, no murmur, click, rub or gallop GI: soft, non-tender; bowel sounds normal; no masses,  no organomegaly Extremities: extremities normal, atraumatic, no cyanosis or edema   DISPOSITION: Home with family  Discharge Instructions    Call MD for:  difficulty breathing, headache or visual disturbances    Complete by:  As directed    Call MD for:  extreme fatigue    Complete by:  As directed    Call MD for:  persistant dizziness or light-headedness    Complete by:  As directed    Call MD for:  persistant nausea and vomiting    Complete by:  As directed    Call MD for:  severe uncontrolled pain    Complete by:  As directed    Call MD for:  temperature >100.4    Complete by:  As directed    Discharge instructions    Complete by:  As directed    Please be sure to follow-up with the  neurologist as scheduled. You have also been made appointment with gastroenterologist for your problems with acid reflux, poor appetite and occasional difficulty swallowing.  You were cared for by a hospitalist during your hospital stay. If you have any questions about your discharge medications or the care you received while you were in the hospital after you are discharged, you can call the unit and asked to speak with the hospitalist on call if the hospitalist that took care of you is not available. Once you are discharged, your primary care physician will handle any further medical issues. Please note that NO REFILLS for any discharge medications will be authorized once you are discharged, as it is imperative that you return to your primary care physician (or establish a relationship with a primary care physician if you do not have one) for your aftercare needs so that they can reassess your need for medications and monitor your lab values. If you do not have a primary care physician, you can call [phone] for a physician referral.   Increase activity slowly    Complete by:  As directed       ALLERGIES:  Allergies  Allergen Reactions  . Penicillins Swelling    Has patient had a PCN reaction causing immediate rash, facial/tongue/throat swelling, SOB or lightheadedness with hypotension: unknown Has patient had a PCN reaction causing severe rash involving mucus membranes or skin necrosis: unknown Has patient had a PCN reaction that required hospitalization : unknown Has patient had a PCN reaction occurring within the last 10 years: no If all of the above answers are "NO", then may proceed with Cephalosporin use.      Current Discharge Medication List    START taking these medications   Details  ciprofloxacin (CIPRO) 500 MG tablet Take 1 tablet (500 mg total) by mouth 2 (two) times daily. Qty: 20 tablet, Refills: 0      CONTINUE these medications which have NOT CHANGED   Details    albuterol (PROVENTIL HFA;VENTOLIN HFA) 108 (90 Base) MCG/ACT inhaler Inhale 2 puffs into the lungs every 6 (six) hours as needed for wheezing or shortness of breath. Qty: 1 Inhaler, Refills: 2    aspirin 325 MG tablet Take 325 mg by mouth daily.    cetirizine (ZYRTEC) 10 MG tablet Take 1 tablet (10 mg total) by mouth daily. Qty: 30 tablet, Refills: 11    Cholecalciferol (  VITAMIN D3) 2000 UNITS TABS Take 2,000 Units by mouth daily. Qty: 30 tablet, Refills: 11   Associated Diagnoses: Vitamin D insufficiency    fluticasone (FLONASE) 50 MCG/ACT nasal spray Place 2 sprays into both nostrils daily. Qty: 16 g, Refills: 6    lisinopril (PRINIVIL,ZESTRIL) 40 MG tablet Take 40 mg by mouth daily.  Refills: 5    meloxicam (MOBIC) 15 MG tablet Take 1 tablet (15 mg total) by mouth daily. Qty: 30 tablet, Refills: 2   Associated Diagnoses: Left knee pain    metFORMIN (GLUCOPHAGE) 500 MG tablet Take 1 tablet (500 mg total) by mouth 2 (two) times daily with a meal. Qty: 60 tablet, Refills: 1    pantoprazole (PROTONIX) 40 MG tablet Take 1 tablet (40 mg total) by mouth daily. Qty: 30 tablet, Refills: 0    sertraline (ZOLOFT) 100 MG tablet Take 100 mg by mouth daily.    tamsulosin (FLOMAX) 0.4 MG CAPS capsule 0.4 mg by mouth nightly after supper for two weeks, then 0.8 mg nightly Qty: 60 capsule, Refills: 5   Associated Diagnoses: BPH (benign prostatic hyperplasia)    tiotropium (SPIRIVA) 18 MCG inhalation capsule Place 18 mcg into inhaler and inhale daily.    atorvastatin (LIPITOR) 40 MG tablet Take 1 tablet (40 mg total) by mouth daily at 6 PM. Qty: 30 tablet, Refills: 2   Associated Diagnoses: Hyperlipidemia    Blood Glucose Monitoring Suppl (ACCU-CHEK AVIVA PLUS) w/Device KIT 1 Device by Does not apply route 4 (four) times daily. Qty: 1 kit, Refills: 0    !! Elastic Bandages & Supports (WRIST SPLINT/ELASTIC LEFT LG) MISC 1 each by Does not apply route daily. Qty: 1 each, Refills: 0    Associated Diagnoses: Pain in both wrists    !! Elastic Bandages & Supports (WRIST SPLINT/ELASTIC RIGHT LG) MISC 1 each by Does not apply route at bedtime. Qty: 1 each, Refills: 0   Associated Diagnoses: Pain in both wrists    glucose blood (ACCU-CHEK AVIVA) test strip Use as instructed Qty: 100 each, Refills: 12    Lancets (ACCU-CHEK SOFT TOUCH) lancets Use as instructed Qty: 100 each, Refills: 12     !! - Potential duplicate medications found. Please discuss with provider.    STOP taking these medications     cefUROXime (CEFTIN) 500 MG tablet          Follow-up [place]. Go on 09/23/2016.   Specialty:  Internal Medicine Why:  at 10:30am for a follow up appointment with Dr Despina Hick information: [place] [id number] [place]       Jerene Bears, MD Follow up on 09/26/2016.   Specialty:  Gastroenterology Why:  GO AT 3PM ON 09/26/16. for problems with acid reflux and poor appetite. Contact information: [address] [phone]           TOTAL DISCHARGE TIME: 35 minutes  [facility]  [facility] Pager [phone]  09/18/2016, 2:00 PM

## 2016-09-18 NOTE — Progress Notes (Signed)
Patient given discharge instructions, and verbalized an understanding of all discharge instructions.  Patient agrees with discharge plan, and is being discharged in stable medical condition.  Patient given transportation via wheelchair.  Patient was instructed to stand up slowly, and to look into getting compression stalkings, to help with the dizziness while standing.

## 2016-09-18 NOTE — Discharge Instructions (Signed)
TALK TO YOUR PCP ABOUT REFERRING YOU TO A UROLOGIST FOR PROSTATE ENLARGEMENT AND FOR RECURRENT UTI'S.  YOU HAVE SEEN DR. PYRTLE PREVIOUSLY FOR A COLONOSCOPY AND HE CAN SEE YOUR FOR ACID REFLUX AS WELL AND TO CONSIDER UPPER ENDOSCOPY. APPT HAS BEEN MADE ON 09/26/16 AT 3 PM.

## 2016-09-18 NOTE — Procedures (Signed)
ELECTROENCEPHALOGRAM REPORT  Date of Study: 09/18/2016  Patient's Name: Wesley [name] MRN: [id number] Date of Birth: [birth year]  Referring Provider: Bonnielee Haff, MD  Clinical History: 60 y.o. male with medical history significant of reent CVA, hypertension, DM, hyperlipidemia, presents with generalized weakness   Medications: Hospital Medications  albuterol (PROVENTIL) (2.5 MG/3ML) 0.083% nebulizer solution 5 mg   aspirin tablet 325 mg   atorvastatin (LIPITOR) tablet 40 mg   bisacodyl (DULCOLAX) EC tablet 10 mg   ciprofloxacin (CIPRO) tablet 500 mg   enoxaparin (LOVENOX) injection 40 mg   fluticasone (FLONASE) 50 MCG/ACT nasal spray 2 spray   ipratropium-albuterol (DUONEB) 0.5-2.5 (3) MG/3ML nebulizer solution 3 mL   lisinopril (PRINIVIL,ZESTRIL) tablet 40 mg   loratadine (CLARITIN) tablet 10 mg   meloxicam (MOBIC) tablet 15 mg   ondansetron (ZOFRAN) injection 4 mg   pantoprazole (PROTONIX) EC tablet 40 mg   senna-docusate (Senokot-S) tablet 2 tablet   sertraline (ZOLOFT) tablet 100 mg   sodium chloride flush (NS) 0.9 % injection 3 mL   tamsulosin (FLOMAX) capsule 0.8 mg   Technical Summary: A multichannel digital EEG recording measured by the international 10-20 system with electrodes applied with paste and impedances below 5000 ohms performed in our laboratory with EKG monitoring in an awake and drowsy patient.  Hyperventilation and photic stimulation were not performed.  The digital EEG was referentially recorded, reformatted, and digitally filtered in a variety of bipolar and referential montages for optimal display.    Description: The patient is awake and drowsy during the recording.  During maximal wakefulness, there is a symmetric, medium voltage 9 Hz posterior dominant rhythm that attenuates with eye opening.  The record is symmetric.  During drowsiness and sleep, there is an increase in theta slowing of the background.  Vertex waves and symmetric sleep spindles  were not seen.  There were no epileptiform discharges or electrographic seizures seen.    EKG lead was unremarkable.  Impression: This awake and drowsy EEG is normal.    Clinical Correlation: A normal EEG does not exclude a clinical diagnosis of epilepsy.  If further clinical questions remain, prolonged EEG may be helpful.  Clinical correlation is advised.   Metta Clines, DO

## 2016-09-18 NOTE — Progress Notes (Signed)
EEG completed, results pending. 

## 2016-09-18 NOTE — Progress Notes (Signed)
OT Cancellation Note  Patient Details Name: Wesley [name] MRN: [id number] DOB: [birth year]   Cancelled Treatment:    Reason Eval/Treat Not Completed: Patient at procedure or test/ unavailable   will recheck on pt later in day or next day. Mickel Baas [place], [place] 09/18/2016, 1:35 PM

## 2016-09-19 LAB — URINE CULTURE: Culture: >=100000 — AB

## 2016-09-19 MED FILL — CIPROFLOXACIN HCL 500 MG TA: 500 | 10 days supply | Qty: 20 | Fill #0

## 2016-09-22 ENCOUNTER — Ambulatory Visit: Payer: Medicaid Other | Attending: Family Medicine | Admitting: Physical Therapy

## 2016-09-22 DIAGNOSIS — I69354 Hemiplegia and hemiparesis following cerebral infarction affecting left non-dominant side: Secondary | ICD-10-CM | POA: Insufficient documentation

## 2016-09-22 DIAGNOSIS — R2689 Other abnormalities of gait and mobility: Secondary | ICD-10-CM | POA: Insufficient documentation

## 2016-09-22 DIAGNOSIS — M6281 Muscle weakness (generalized): Secondary | ICD-10-CM | POA: Insufficient documentation

## 2016-09-22 DIAGNOSIS — R2681 Unsteadiness on feet: Secondary | ICD-10-CM | POA: Insufficient documentation

## 2016-09-23 ENCOUNTER — Ambulatory Visit: Payer: Medicaid Other | Attending: Family Medicine | Admitting: Family Medicine

## 2016-09-23 ENCOUNTER — Encounter: Payer: Self-pay | Admitting: Family Medicine

## 2016-09-23 VITALS — BP 103/63 | HR 62 | Temp 98.5°F | Resp 16 | Ht 74.0 in | Wt 212.0 lb

## 2016-09-23 DIAGNOSIS — Z8673 Personal history of transient ischemic attack (TIA), and cerebral infarction without residual deficits: Secondary | ICD-10-CM | POA: Diagnosis not present

## 2016-09-23 DIAGNOSIS — B001 Herpesviral vesicular dermatitis: Secondary | ICD-10-CM | POA: Diagnosis not present

## 2016-09-23 DIAGNOSIS — Z79899 Other long term (current) drug therapy: Secondary | ICD-10-CM | POA: Insufficient documentation

## 2016-09-23 DIAGNOSIS — Z7984 Long term (current) use of oral hypoglycemic drugs: Secondary | ICD-10-CM | POA: Insufficient documentation

## 2016-09-23 DIAGNOSIS — Z7982 Long term (current) use of aspirin: Secondary | ICD-10-CM | POA: Diagnosis not present

## 2016-09-23 DIAGNOSIS — G4733 Obstructive sleep apnea (adult) (pediatric): Secondary | ICD-10-CM | POA: Diagnosis not present

## 2016-09-23 DIAGNOSIS — I495 Sick sinus syndrome: Secondary | ICD-10-CM | POA: Diagnosis not present

## 2016-09-23 DIAGNOSIS — N401 Enlarged prostate with lower urinary tract symptoms: Secondary | ICD-10-CM | POA: Diagnosis present

## 2016-09-23 DIAGNOSIS — E119 Type 2 diabetes mellitus without complications: Secondary | ICD-10-CM | POA: Diagnosis not present

## 2016-09-23 DIAGNOSIS — R200 Anesthesia of skin: Secondary | ICD-10-CM

## 2016-09-23 DIAGNOSIS — R739 Hyperglycemia, unspecified: Secondary | ICD-10-CM

## 2016-09-23 DIAGNOSIS — Z87891 Personal history of nicotine dependence: Secondary | ICD-10-CM | POA: Insufficient documentation

## 2016-09-23 DIAGNOSIS — R338 Other retention of urine: Secondary | ICD-10-CM

## 2016-09-23 DIAGNOSIS — N3000 Acute cystitis without hematuria: Secondary | ICD-10-CM

## 2016-09-23 DIAGNOSIS — R339 Retention of urine, unspecified: Secondary | ICD-10-CM | POA: Diagnosis not present

## 2016-09-23 DIAGNOSIS — J449 Chronic obstructive pulmonary disease, unspecified: Secondary | ICD-10-CM | POA: Diagnosis not present

## 2016-09-23 DIAGNOSIS — Z950 Presence of cardiac pacemaker: Secondary | ICD-10-CM | POA: Diagnosis not present

## 2016-09-23 LAB — GLUCOSE, POCT (MANUAL RESULT ENTRY): POC GLUCOSE: 159 mg/dL — AB (ref 70–99)

## 2016-09-23 MED ORDER — VALACYCLOVIR HCL 1 G PO TABS: 2000.0000 mg | ORAL_TABLET | Freq: Two times a day (BID) | ORAL | 0 refills | Status: DC

## 2016-09-23 MED ORDER — ALBUTEROL SULFATE (2.5 MG/3ML) 0.083% IN NEBU: 2.5000 mg | INHALATION_SOLUTION | Freq: Four times a day (QID) | RESPIRATORY_TRACT | 1 refills | Status: DC | PRN

## 2016-09-23 MED FILL — valACYclovir HCL 1 GM TABS: 1 | 1 days supply | Qty: 4 | Fill #0

## 2016-09-23 MED FILL — ALBUTEROL 0.083% INHAL SOLN: (2.5 MG/3ML | 15 days supply | Qty: 180 | Fill #0

## 2016-09-23 NOTE — Assessment & Plan Note (Signed)
Recurrent UTI Patient to finish cipro Continue flomax Has f/u with urology

## 2016-09-23 NOTE — Progress Notes (Signed)
Subjective:  Patient ID: Wesley [name], male    DOB: 11/15/1956  Age: 60 y.o. MRN: [id number]  CC: Hospitalization Follow-up   HPI [name] has hx of CVA, HTN, DM, hx of substance abuse, COPD,  Sick sinus syndrome s/p pacemaker, BPH he presents with his wife for   1. Hospitalization follow up: Hospitalized from 9/13-9/17/2017 for L sided numbness. CVA vis TIA.  Hospitalized from 10/3-10/04/2106 for UTI and COPD exacerbation.   At the beginning of September he had an episode of pelvic pain and chest pain and was found to have urinary retention on 08/19/2016. He presented to the ED, bladder scan revealed 600 cc of urine, he  required catheter placement. He saw urology in follow up, it was recommended for the catheter to remain in place.  He developed L sided weakness and presented to the ED again on 08/29/16. This times he was admitted for suspected  Stroke/TIA, MRI was not done due to [place] [employer] presence.  Imaging:  CT head revealed: No acute intracranial findings. There is moderate generalized atrophy and chronic appearing white matter hypodensities which likely represent small vessel ischemic disease. Remote left-sided infarctions, lentiform nuclei and pons  CT neck was negative for carotid stenosis bilaterally.   During this hospitalization, he  was subsequently found to have E. Coli UTI which was treated with ceftin x 7 days.  He completed ceftin, developed fever, chills and confusion at home and was re-admitted for recurrent/persistent UTI. Urine culture again grew out E. Coli. He is finishing a course of cipro currently.  He has known BPH and is taking flomax. He denies fever and chills currently. He still has some low back pain and dysuria. No urinary retention. He has f/u with urology next week. He request nebulizer machine.   2. Orthostatic hypotension: this was noted during his last hospitalization. He reports dizziness and lightheadedness at times. No syncopal  episodes.   3. Confusion: his wife has noticed that patient losses his train of thought during conversations and becomes agitated. He is less energetic and less engaged. He has intermittent tremor. He was evaluated with an awake and drowsy EEG on 105/2017 that was normal.   4. OSA: sleep study done on 08/15/2016 revealed moderate OSA with recommendation for therapeutic CPAP titration to determine optimal pressure required to alleviate sleep disordered breathing.   Social History  Substance Use Topics  . Smoking status: Former Smoker    Packs/day: 0.05    Years: 40.00    Types: Cigarettes    Quit date: 08/06/2016  . Smokeless tobacco: Never Used  . Alcohol use No     Comment: hx of ETOH abuse from 2001-2016    Outpatient Medications Prior to Visit  Medication Sig Dispense Refill  . albuterol (PROVENTIL HFA;VENTOLIN HFA) 108 (90 Base) MCG/ACT inhaler Inhale 2 puffs into the lungs every 6 (six) hours as needed for wheezing or shortness of breath. 1 Inhaler 2  . aspirin 325 MG tablet Take 325 mg by mouth daily.    Marland Kitchen atorvastatin (LIPITOR) 40 MG tablet Take 1 tablet (40 mg total) by mouth daily at 6 PM. 30 tablet 2  . Blood Glucose Monitoring Suppl (ACCU-CHEK AVIVA PLUS) w/Device KIT 1 Device by Does not apply route 4 (four) times daily. 1 kit 0  . cetirizine (ZYRTEC) 10 MG tablet Take 1 tablet (10 mg total) by mouth daily. 30 tablet 11  . Cholecalciferol (VITAMIN D3) 2000 UNITS TABS Take 2,000 Units by mouth daily. (  Patient taking differently: Take 2,000 Units by mouth every morning. ) 30 tablet 11  . ciprofloxacin (CIPRO) 500 MG tablet Take 1 tablet (500 mg total) by mouth 2 (two) times daily. 20 tablet 0  . Elastic Bandages & Supports (WRIST SPLINT/ELASTIC LEFT LG) MISC 1 each by Does not apply route daily. 1 each 0  . Elastic Bandages & Supports (WRIST SPLINT/ELASTIC RIGHT LG) MISC 1 each by Does not apply route at bedtime. 1 each 0  . fluticasone (FLONASE) 50 MCG/ACT nasal spray Place 2  sprays into both nostrils daily. (Patient taking differently: Place 2 sprays into both nostrils daily as needed for allergies. ) 16 g 6  . glucose blood (ACCU-CHEK AVIVA) test strip Use as instructed 100 each 12  . Lancets (ACCU-CHEK SOFT TOUCH) lancets Use as instructed 100 each 12  . lisinopril (PRINIVIL,ZESTRIL) 40 MG tablet Take 40 mg by mouth daily.   5  . meloxicam (MOBIC) 15 MG tablet Take 1 tablet (15 mg total) by mouth daily. (Patient taking differently: Take 15 mg by mouth every morning. ) 30 tablet 2  . metFORMIN (GLUCOPHAGE) 500 MG tablet Take 1 tablet (500 mg total) by mouth 2 (two) times daily with a meal. 60 tablet 1  . pantoprazole (PROTONIX) 40 MG tablet Take 1 tablet (40 mg total) by mouth daily. (Patient taking differently: Take 40 mg by mouth every morning. ) 30 tablet 0  . sertraline (ZOLOFT) 100 MG tablet Take 100 mg by mouth daily.    . tamsulosin (FLOMAX) 0.4 MG CAPS capsule 0.4 mg by mouth nightly after supper for two weeks, then 0.8 mg nightly (Patient taking differently: Take 0.8 mg by mouth daily after supper. ) 60 capsule 5  . tiotropium (SPIRIVA) 18 MCG inhalation capsule Place 18 mcg into inhaler and inhale daily.     No facility-administered medications prior to visit.     ROS Review of Systems  Constitutional: Negative for chills, fatigue, fever and unexpected weight change.  Eyes: Negative for visual disturbance.  Respiratory: Positive for cough and shortness of breath.   Cardiovascular: Negative for chest pain, palpitations and leg swelling.  Gastrointestinal: Negative for abdominal pain, blood in stool, constipation, diarrhea, nausea and vomiting.  Endocrine: Negative for polydipsia, polyphagia and polyuria.  Genitourinary: Positive for dysuria.       Erectile dysfunction   Musculoskeletal: Positive for back pain. Negative for arthralgias, gait problem, myalgias and neck pain.  Skin: Negative for rash.  Allergic/Immunologic: Negative for immunocompromised  state.  Neurological: Positive for tremors, light-headedness and numbness. Negative for seizures, syncope, facial asymmetry, speech difficulty, weakness and headaches.  Hematological: Negative for adenopathy. Does not bruise/bleed easily.  Psychiatric/Behavioral: Positive for sleep disturbance. Negative for dysphoric mood and suicidal ideas. The patient is not nervous/anxious.     Objective:  BP 103/63 (BP Location: Right Arm, Patient Position: Sitting, Cuff Size: Large)   Pulse 62   Temp 98.5 F (36.9 C) (Oral)   Resp 16   Ht '6\' 2"'  (1.88 m)   Wt 212 lb (96.2 kg)   SpO2 98%   BMI 27.22 kg/m   BP/Weight 09/23/2016 09/18/2016 45/02/6467  Systolic BP 032 122 -  Diastolic BP 63 81 -  Wt. (Lbs) 212 - 207.23  BMI 27.22 - 26.61   Orthostatic VS for the past 24 hrs:  BP- Lying Pulse- Lying BP- Sitting Pulse- Sitting BP- Standing at 0 minutes Pulse- Standing at 0 minutes  09/23/16 1204 105/71 67 107/71 68 104/68 71  Physical Exam  Constitutional: He appears well-developed and well-nourished. No distress.  HENT:  Head: Normocephalic and atraumatic.    Eyes: Conjunctivae and EOM are normal. Pupils are equal, round, and reactive to light.  Neck: Normal range of motion. Neck supple.  Cardiovascular: Normal rate, regular rhythm, normal heart sounds and intact distal pulses.   Pulmonary/Chest: Effort normal and breath sounds normal.  Musculoskeletal: He exhibits no edema.  Neurological: He is alert.  Skin: Skin is warm and dry. No rash noted. No erythema.  Psychiatric: He has a normal mood and affect.    Lab Results  Component Value Date   HGBA1C 6.8 (H) 08/29/2016  CBG 159  Assessment & Plan:  Edmon was seen today for hospitalization follow-up.  Diagnoses and all orders for this visit:  Benign prostatic hyperplasia with urinary retention  Obstructive sleep apnea -     Ambulatory referral to Pulmonology  COPD mixed type ([place]) -     albuterol (PROVENTIL) (2.5 MG/3ML)  0.083% nebulizer solution; Take 3 mLs (2.5 mg total) by nebulization every 6 (six) hours as needed for wheezing or shortness of breath.  Cold sore -     valACYclovir (VALTREX) 1000 MG tablet; Take 2 tablets (2,000 mg total) by mouth 2 (two) times daily.  Hyperglycemia -     Glucose (CBG)  Left sided numbness   There are no diagnoses linked to this encounter.  No orders of the defined types were placed in this encounter.   Follow-up: Return in about 4 weeks (around 10/21/2016).   Boykin Nearing MD

## 2016-09-23 NOTE — Patient Instructions (Addendum)
Dax was seen today for hypertension.  Diagnoses and all orders for this visit:  Benign prostatic hyperplasia with urinary retention  Obstructive sleep apnea -     Ambulatory referral to Pulmonology  COPD mixed type ([place]) -     albuterol (PROVENTIL) (2.5 MG/3ML) 0.083% nebulizer solution; Take 3 mLs (2.5 mg total) by nebulization every 6 (six) hours as needed for wheezing or shortness of breath.  Cold sore -     valACYclovir (VALTREX) 1000 MG tablet; Take 2 tablets (2,000 mg total) by mouth 2 (two) times daily.   I will work on getting the MRI of your brain completed  [facility] follow up appointment is   F/u in 4 weeks for BP check and confusion   Dr. Adrian Blackwater

## 2016-09-23 NOTE — Assessment & Plan Note (Signed)
Patient will need CPAP trial Referred to pulmonologist for set up and adjustment

## 2016-09-23 NOTE — Assessment & Plan Note (Signed)
History of stroke with new onset confusion, L sided numbness and weakness. Patient on secondary stroke prevention: continue ASA, statin, HTN control, diabetes control  Recommend  Neurology follow up Possible MR/MRA head if arrangements could be made to accomodate patient's pacemaker [employer] Mode # [id number] RA-Lead [id number] RVA-Lead [phone]

## 2016-09-23 NOTE — Progress Notes (Signed)
HFU [place], UTI  Still with Burning with urination  SHOB, stated feeling better after breathing Tx  DM glucose running 100 122 No pain today Former smoker x 2 month  Medicine refills

## 2016-09-23 NOTE — Assessment & Plan Note (Signed)
Recent exacerbation resolved Nebulizer machine provided

## 2016-09-25 ENCOUNTER — Ambulatory Visit: Payer: Medicaid Other | Admitting: Physical Therapy

## 2016-09-25 ENCOUNTER — Encounter: Payer: Self-pay | Admitting: Physical Therapy

## 2016-09-25 VITALS — BP 112/78

## 2016-09-25 DIAGNOSIS — R2681 Unsteadiness on feet: Secondary | ICD-10-CM

## 2016-09-25 DIAGNOSIS — I69354 Hemiplegia and hemiparesis following cerebral infarction affecting left non-dominant side: Secondary | ICD-10-CM

## 2016-09-25 DIAGNOSIS — M6281 Muscle weakness (generalized): Secondary | ICD-10-CM

## 2016-09-25 DIAGNOSIS — R2689 Other abnormalities of gait and mobility: Secondary | ICD-10-CM
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
## 2016-09-25 NOTE — Patient Instructions (Addendum)
KNEE: Flexion - Prone    Bend knee. Raise heel toward buttocks. Do not raise hips. _10__ reps per set, _2__ sets per day  EXTENSION: Prone - Knee Flexed (Active)    Lie on stomach, right knee bent to 90. Lift leg toward ceiling. Complete _2__ sets of _10__ repetitions.  http://gtsc.exer.us/66   Copyright  VHI. All rights reserved.  Functional Quadriceps: Sit to Stand    Sit on edge of chair, LEFT FOOT IN BACK feet flat on floor. Stand upright, extending knees fully. Repeat _10___ times per set. Do __2__ sets per session. Do _1-2___ sessions per day.  http://orth.exer.us/734   Copyright  VHI. All rights reserved.

## 2016-09-26 ENCOUNTER — Ambulatory Visit: Payer: Medicaid Other | Admitting: Gastroenterology

## 2016-09-29 ENCOUNTER — Encounter: Payer: Self-pay | Admitting: Physical Therapy

## 2016-09-29 ENCOUNTER — Ambulatory Visit: Payer: Medicaid Other | Admitting: Physical Therapy

## 2016-09-29 DIAGNOSIS — I69354 Hemiplegia and hemiparesis following cerebral infarction affecting left non-dominant side: Secondary | ICD-10-CM | POA: Diagnosis present

## 2016-09-29 DIAGNOSIS — R2689 Other abnormalities of gait and mobility: Secondary | ICD-10-CM | POA: Diagnosis present

## 2016-09-29 DIAGNOSIS — M6281 Muscle weakness (generalized): Secondary | ICD-10-CM | POA: Diagnosis not present

## 2016-09-29 DIAGNOSIS — R2681 Unsteadiness on feet: Secondary | ICD-10-CM
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
## 2016-10-02 ENCOUNTER — Ambulatory Visit: Payer: Medicaid Other | Admitting: Physical Therapy

## 2016-10-02 VITALS — BP 141/92 | HR 82

## 2016-10-02 DIAGNOSIS — I69354 Hemiplegia and hemiparesis following cerebral infarction affecting left non-dominant side: Secondary | ICD-10-CM

## 2016-10-02 DIAGNOSIS — M6281 Muscle weakness (generalized): Secondary | ICD-10-CM

## 2016-10-02 DIAGNOSIS — R2689 Other abnormalities of gait and mobility: Secondary | ICD-10-CM

## 2016-10-02 DIAGNOSIS — R2681 Unsteadiness on feet: Secondary | ICD-10-CM
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
## 2016-10-02 NOTE — Patient Instructions (Signed)
Hamstring Curl: Resisted (Prone)    Anchor around both knees, bend left knee then return straight.  Use yellow band. Repeat 10 times per set. Do once a day.  http://orth.exer.us/670   Copyright  VHI. All rights reserved.

## 2016-10-03 ENCOUNTER — Encounter: Payer: Self-pay | Admitting: Family Medicine

## 2016-10-03 ENCOUNTER — Other Ambulatory Visit: Payer: Self-pay

## 2016-10-03 ENCOUNTER — Other Ambulatory Visit: Payer: Self-pay | Admitting: Family Medicine

## 2016-10-03 ENCOUNTER — Ambulatory Visit: Payer: Medicaid Other | Attending: Family Medicine | Admitting: Family Medicine

## 2016-10-03 VITALS — BP 118/80 | HR 64 | Temp 97.7°F | Ht 74.0 in | Wt 212.0 lb

## 2016-10-03 DIAGNOSIS — Z7984 Long term (current) use of oral hypoglycemic drugs: Secondary | ICD-10-CM | POA: Diagnosis not present

## 2016-10-03 DIAGNOSIS — Z7982 Long term (current) use of aspirin: Secondary | ICD-10-CM | POA: Diagnosis not present

## 2016-10-03 DIAGNOSIS — Z79899 Other long term (current) drug therapy: Secondary | ICD-10-CM | POA: Diagnosis not present

## 2016-10-03 DIAGNOSIS — R42 Dizziness and giddiness: Secondary | ICD-10-CM | POA: Diagnosis not present

## 2016-10-03 DIAGNOSIS — J449 Chronic obstructive pulmonary disease, unspecified: Secondary | ICD-10-CM

## 2016-10-03 DIAGNOSIS — G4733 Obstructive sleep apnea (adult) (pediatric): Secondary | ICD-10-CM

## 2016-10-03 DIAGNOSIS — Z950 Presence of cardiac pacemaker: Secondary | ICD-10-CM

## 2016-10-03 DIAGNOSIS — J322 Chronic ethmoidal sinusitis: Secondary | ICD-10-CM

## 2016-10-03 DIAGNOSIS — H903 Sensorineural hearing loss, bilateral: Secondary | ICD-10-CM

## 2016-10-03 DIAGNOSIS — R0602 Shortness of breath: Secondary | ICD-10-CM | POA: Diagnosis present

## 2016-10-03 DIAGNOSIS — E119 Type 2 diabetes mellitus without complications: Secondary | ICD-10-CM | POA: Diagnosis not present

## 2016-10-03 DIAGNOSIS — I10 Essential (primary) hypertension: Secondary | ICD-10-CM | POA: Insufficient documentation

## 2016-10-03 MED ORDER — MECLIZINE HCL 25 MG PO TABS: 25.0000 mg | ORAL_TABLET | Freq: Two times a day (BID) | ORAL | 0 refills | Status: DC | PRN

## 2016-10-03 MED ORDER — HYDROCOD POLST-CPM POLST ER 10-8 MG/5ML PO SUER: 5.0000 mL | Freq: Two times a day (BID) | ORAL | 0 refills | Status: DC | PRN

## 2016-10-03 MED ORDER — TAMSULOSIN HCL 0.4 MG PO CAPS: ORAL_CAPSULE | ORAL | 5 refills | Status: DC

## 2016-10-03 MED FILL — TAMSULOSIN HCL 0.4 MG CAP: 0.4 | 27 days supply | Qty: 60 | Fill #1

## 2016-10-03 NOTE — Patient Instructions (Signed)

## 2016-10-03 NOTE — Progress Notes (Signed)
Pt is here with SOB and cough, pt states that he feels light headed.
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
## 2016-10-06 ENCOUNTER — Ambulatory Visit: Payer: Medicaid Other | Admitting: Physical Therapy

## 2016-10-06 DIAGNOSIS — R2689 Other abnormalities of gait and mobility: Secondary | ICD-10-CM

## 2016-10-06 DIAGNOSIS — M6281 Muscle weakness (generalized): Secondary | ICD-10-CM | POA: Diagnosis not present
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
## 2016-10-08 ENCOUNTER — Telehealth: Payer: Self-pay | Admitting: Family Medicine

## 2016-10-08 DIAGNOSIS — J449 Chronic obstructive pulmonary disease, unspecified: Secondary | ICD-10-CM

## 2016-10-08 NOTE — Telephone Encounter (Signed)
Patient was prescribed Tussionex for cough  Patient has medicaid therefore medication won't be covered  Patient would like to know if there is something else that can be prescribed  Please follow up

## 2016-10-09 ENCOUNTER — Ambulatory Visit: Payer: Medicaid Other | Admitting: Physical Therapy

## 2016-10-09 MED ORDER — BENZONATATE 100 MG PO CAPS: 100.0000 mg | ORAL_CAPSULE | Freq: Three times a day (TID) | ORAL | 0 refills | Status: DC | PRN

## 2016-10-09 MED FILL — PANTOPRAZOLE SOD DR 20 MG T: 20 | 15 days supply | Qty: 30 | Fill #2

## 2016-10-09 MED FILL — LISINOPRIL 40 MG TABLET: 40 | 30 days supply | Qty: 30 | Fill #1

## 2016-10-09 MED FILL — BENZONATATE 100 MG CAPSULE: 100 | 30 days supply | Qty: 30 | Fill #0

## 2016-10-09 NOTE — Telephone Encounter (Signed)
Ordered tessalon perles for patient's cough

## 2016-10-10 ENCOUNTER — Ambulatory Visit: Payer: Medicaid Other

## 2016-10-10 DIAGNOSIS — M6281 Muscle weakness (generalized): Secondary | ICD-10-CM | POA: Diagnosis not present

## 2016-10-10 DIAGNOSIS — I69354 Hemiplegia and hemiparesis following cerebral infarction affecting left non-dominant side: Secondary | ICD-10-CM

## 2016-10-10 DIAGNOSIS — R2689 Other abnormalities of gait and mobility: Secondary | ICD-10-CM
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
## 2016-10-10 NOTE — Telephone Encounter (Signed)
Patient hipaa verif; RN advised patient of refill sent to pharmacy.  No further questions at this time. Priscille Heidelberg, RN, BSN

## 2016-10-10 NOTE — Patient Instructions (Signed)
EXTENSION: Prone - Knee Flexed (Active)    Lie on stomach, right knee bent to 90. Lift leg toward ceiling. Switch and perform with left leg. Complete _2__ sets of _10__ repetitions.  http://gtsc.exer.us/66   Copyright  VHI. All rights reserved.  Functional Quadriceps: Sit to Stand    Sit on edge of chair, keep feet shoulder width apart feet flat on floor. Stand upright, extending knees fully. Repeat _10___ times per set. Do __2__ sets per session. Do _1-2___ sessions per day.  PROGRESSION: perform sit to stands from a lower chair/sofa. http://orth.exer.us/734   Copyright  VHI. All rights reserved.   Hamstring Curl: Resisted (Prone)    Tie red band around both ankles, bend left knee then return straight.  Use red band. Make sure hips don't rise from mat. Repeat 10 times per set. Perform 3 sets.  Perform every other day, or 3-4 times a week. http://orth.exer.us/670   Copyright  VHI. All rights reserved.   Side Stepping With Semi-Squat    Step to side, while holding onto porch rail or counter at home. Perform a mini-squat, maintain squat and then side step along the rail, while keeping heels on ground. Returning to stand, and relax before performing again. Perform _2__ laps side stepping right. Perform __2_ laps side stepping left.  Copyright  VHI. All rights reserved.

## 2016-10-13 ENCOUNTER — Encounter: Payer: Self-pay | Admitting: Nurse Practitioner

## 2016-10-13 ENCOUNTER — Ambulatory Visit (INDEPENDENT_AMBULATORY_CARE_PROVIDER_SITE_OTHER): Payer: Medicaid Other | Admitting: Nurse Practitioner

## 2016-10-13 VITALS — BP 126/81 | HR 60 | Ht 74.0 in | Wt 215.6 lb

## 2016-10-13 DIAGNOSIS — E7800 Pure hypercholesterolemia, unspecified: Secondary | ICD-10-CM | POA: Diagnosis not present

## 2016-10-13 DIAGNOSIS — R200 Anesthesia of skin: Secondary | ICD-10-CM | POA: Diagnosis not present

## 2016-10-13 DIAGNOSIS — G250 Essential tremor: Secondary | ICD-10-CM | POA: Diagnosis not present

## 2016-10-13 DIAGNOSIS — I10 Essential (primary) hypertension: Secondary | ICD-10-CM

## 2016-10-13 NOTE — Patient Instructions (Addendum)
Stressed the importance of management of risk factors to prevent further stroke/TIA  Continue aspirin 325mg  for secondary stroke prevention Maintain strict control of hypertension with blood pressure goal below 130/90, today's reading 126/81  continue antihypertensive medications Control of diabetes with hemoglobin A1c below 6.5 followed by primary care  Cholesterol with LDL cholesterol less than 70, followed by primary care,  most recent 89 continue Lipitor Continue physical therapy  eat healthy diet with whole grains,  fresh fruits and vegetables

## 2016-10-13 NOTE — Progress Notes (Signed)
I reviewed above note and agree with the assessment and plan.  Rosalin Hawking, MD PhD Stroke Neurology 10/13/2016 12:41 PM
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
## 2016-10-14 ENCOUNTER — Ambulatory Visit: Payer: Medicaid Other | Admitting: Physical Therapy

## 2016-10-14 DIAGNOSIS — R2689 Other abnormalities of gait and mobility: Secondary | ICD-10-CM

## 2016-10-14 DIAGNOSIS — M6281 Muscle weakness (generalized): Secondary | ICD-10-CM | POA: Diagnosis not present
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
## 2016-10-16 ENCOUNTER — Ambulatory Visit: Payer: Medicaid Other | Attending: Family Medicine | Admitting: Rehabilitative and Restorative Service Providers"

## 2016-10-16 DIAGNOSIS — R2689 Other abnormalities of gait and mobility: Secondary | ICD-10-CM | POA: Diagnosis present

## 2016-10-16 DIAGNOSIS — M6281 Muscle weakness (generalized): Secondary | ICD-10-CM | POA: Diagnosis not present

## 2016-10-16 DIAGNOSIS — R2681 Unsteadiness on feet: Secondary | ICD-10-CM | POA: Diagnosis present

## 2016-10-16 DIAGNOSIS — I69354 Hemiplegia and hemiparesis following cerebral infarction affecting left non-dominant side: Secondary | ICD-10-CM | POA: Insufficient documentation
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
## 2016-10-16 NOTE — Patient Instructions (Signed)
Heel Cord Stretch    Place one leg forward, bent, other leg behind and straight. Lean forward keeping back heel flat. Hold __30__ seconds while counting out loud. Repeat with other leg. Repeat __3__ times. Do _1-2___ sessions per day.  http://gt2.exer.us/511   Copyright  VHI. All rights reserved.    ANKLE: Dorsiflexion (Band)    Sit at edge of surface. Place band around top of foot. Keeping heel on floor, raise toes of banded foot. Hold _5__ seconds. Use __red______ band. _10__ reps per set, _2__ sets per day.  Copyright  VHI. All rights reserved.

## 2016-10-17 ENCOUNTER — Ambulatory Visit: Payer: Medicaid Other | Admitting: Family Medicine

## 2016-10-20 ENCOUNTER — Ambulatory Visit: Payer: Medicaid Other | Admitting: Physical Therapy

## 2016-10-20 DIAGNOSIS — I69354 Hemiplegia and hemiparesis following cerebral infarction affecting left non-dominant side: Secondary | ICD-10-CM

## 2016-10-20 DIAGNOSIS — R2681 Unsteadiness on feet: Secondary | ICD-10-CM

## 2016-10-20 DIAGNOSIS — M6281 Muscle weakness (generalized): Secondary | ICD-10-CM | POA: Diagnosis not present

## 2016-10-20 DIAGNOSIS — R2689 Other abnormalities of gait and mobility: Secondary | ICD-10-CM
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
## 2016-10-23 ENCOUNTER — Ambulatory Visit: Payer: Medicaid Other | Admitting: Physical Therapy

## 2016-10-23 VITALS — BP 129/89

## 2016-10-23 NOTE — Therapy (Signed)
[facility] [address] [place], [place], [address] Phone: [phone]   Fax:  [phone]  Patient Details  Name: Wesley [name] MRN: [id number] Date of Birth: Sep 07, 1956 Referring Provider:  Boykin Nearing, MD  Encounter Date: 10/23/2016 Pt not feeling well today; light headed and balance is off.  Tried coming today but really just wants to go home. Cancelled visit for today.  Bjorn Loser, PTA  10/23/16, 10:39 AM [facility] [address] [place], [place], [address] Phone: [phone]   Fax:  [phone]

## 2016-10-24 MED FILL — metFORMIN HCL 500 MG TABS: 500 | 30 days supply | Qty: 60 | Fill #1

## 2016-10-24 MED FILL — METOPROLOL TARTRATE 25 MG T: 25 | 30 days supply | Qty: 60 | Fill #3

## 2016-10-24 MED FILL — ATORVASTATIN 40 MG TABLET: 40 | 30 days supply | Qty: 30 | Fill #1

## 2016-10-24 MED FILL — MELOXICAM 15 MG TABLET: 15 | 30 days supply | Qty: 30 | Fill #2

## 2016-10-27 ENCOUNTER — Ambulatory Visit: Payer: Medicaid Other | Admitting: Physical Therapy

## 2016-10-27 DIAGNOSIS — M6281 Muscle weakness (generalized): Secondary | ICD-10-CM

## 2016-10-27 DIAGNOSIS — R2689 Other abnormalities of gait and mobility: Secondary | ICD-10-CM
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
## 2016-10-30 ENCOUNTER — Ambulatory Visit: Payer: Medicaid Other | Admitting: Physical Therapy

## 2016-10-30 DIAGNOSIS — M6281 Muscle weakness (generalized): Secondary | ICD-10-CM | POA: Diagnosis not present

## 2016-10-30 DIAGNOSIS — R2689 Other abnormalities of gait and mobility: Secondary | ICD-10-CM

## 2016-10-30 DIAGNOSIS — I69354 Hemiplegia and hemiparesis following cerebral infarction affecting left non-dominant side: Secondary | ICD-10-CM

## 2016-10-30 DIAGNOSIS — R2681 Unsteadiness on feet: Secondary | ICD-10-CM

## 2016-10-30 NOTE — Patient Instructions (Signed)
     Loop GREEN theraband around your RIGHT foot; use the BLUE theraband on your LEFT foot. Step on band with right foot and hold band with right hand.    Place your LEFT hand on your LEFT knee to prevent knee from moving (motion should come from your ankle). Move ankle outward against resistance of band, then slowly return to resting position. Do this exercise in all 4 directions (as pictured above). Perform __20__ reps, __2-3__ times per day, __3-4__ times per week on each ankle.
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
## 2016-11-04 ENCOUNTER — Ambulatory Visit (INDEPENDENT_AMBULATORY_CARE_PROVIDER_SITE_OTHER): Payer: Medicaid Other | Admitting: Family Medicine

## 2016-11-04 ENCOUNTER — Encounter: Payer: Self-pay | Admitting: Family Medicine

## 2016-11-04 DIAGNOSIS — M25531 Pain in right wrist: Secondary | ICD-10-CM | POA: Diagnosis not present

## 2016-11-04 DIAGNOSIS — M25532 Pain in left wrist: Secondary | ICD-10-CM | POA: Diagnosis not present

## 2016-11-04 MED ORDER — METHYLPREDNISOLONE ACETATE 40 MG/ML IJ SUSP
40.0000 mg | Freq: Once | INTRAMUSCULAR | Status: AC
  Administered 2016-11-04: 40 mg via INTRA_ARTICULAR

## 2016-11-04 NOTE — Progress Notes (Signed)
Wesley [name] - 60 y.o. male MRN [id number]  Date of birth: 05/05/1956  SUBJECTIVE:  Including CC & ROS.   Wesley [name] is a 60 yo M that is presenting with bilateral wrist pain. The pain has been present for a few years now. He received an injection into each wrist last year and had relief of his pain until about three weeks ago. He wears braces on a regular basis to help with the pain. He has pain on the dorsal aspect of each wrist with activity. He denies any inciting event. He denies any numbness or tingling. He reports no injuries in the interim.   ROS: No unexpected weight loss, fever, chills, instability, muscle pain, numbness/tingling, redness, otherwise see HPI    HISTORY: Past Medical, Surgical, Social, and Family History Reviewed & Updated per EMR.   Pertinent Historical Findings include: PMSHx -  COPD, Cardiac pacemaker,   DATA REVIEWED: 04/18/16: Right wrist x-ray: Radioscaphoid osteoarthritis./ 04/06/16: left wrist x-ray: No acute posttraumatic findings. 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  PHYSICAL EXAM:  VS: BP:126/87  HR: bpm  TEMP: ( )  RESP:   HT:6\' 2"  (188 cm)   WT:212 lb (96.2 kg)  BMI:27.3 PHYSICAL EXAM: Gen: NAD, alert, cooperative with exam, well-appearing HEENT: clear conjunctiva, EOMI CV:  no edema, capillary refill brisk,  Resp: non-labored, normal speech Skin: no rashes, normal turgor  Neuro: no gross deficits.  Psych:  alert and oriented Left and right wrist:  TTP of the distal radioscaphoid junction b/l  Some swelling on the dorsal aspect of the wrist on the right  Pain with wrist extension b/l  Normal rom in other directions.  Normal thumb opposition  Normal pincer grasp  Normal grip strength  No pain with CMC compression  No scapholunate instability  Neurovascularly intact    Aspiration/Injection Procedure Note Wesley [name] Jan 29, 1956  Procedure: Injection Indications: Left wrist pain   Procedure Details Consent: Risks  of procedure as well as the alternatives and risks of each were explained to the (patient/caregiver).  Consent for procedure obtained. Time Out: Verified patient identification, verified procedure, site/side was marked, verified correct patient position, special equipment/implants available, medications/allergies/relevent history reviewed, required imaging and test results available.  Performed.  The area was cleaned with iodine and alcohol swabs.    The left wrist was injected using 0.5 cc's of 40 mg Depomedrol and 1.5 cc's of 1% lidocaine with a 21 1 1/2" needle.  Ultrasound was used. Images weren't obtained.     A sterile dressing was applied.  Patient did tolerate procedure well.  Aspiration/Injection Procedure Note Wesley [name] Feb 24, 1956  Procedure: Injection Indications: Right wrist pain   Procedure Details Consent: Risks of procedure as well as the alternatives and risks of each were explained to the (patient/caregiver).  Consent for procedure obtained. Time Out: Verified patient identification, verified procedure, site/side was marked, verified correct patient position, special equipment/implants available, medications/allergies/relevent history reviewed, required imaging and test results available.  Performed.  The area was cleaned with iodine and alcohol swabs.    The right wrist was injected using 0.5 cc's of 40 mg Depomedrol and 1.5 cc's of 1% lidocaine with a 21 1 1/2" needle.  Ultrasound was used. Images weren't obtained in    A sterile dressing was applied.  Patient did tolerate procedure well.   ASSESSMENT & PLAN: /  Pain in both wrists His has evidence of arthritis on imaging. Has had good relief with injections.  - b/l injections today  -  He will likely get less and less relief with injections. Once he reaches that point then he would need to consider a referral to a hand surgeon.  - f/u in 4 weeks

## 2016-11-09 NOTE — Assessment & Plan Note (Signed)
His has evidence of arthritis on imaging. Has had good relief with injections.  - b/l injections today  - He will likely get less and less relief with injections. Once he reaches that point then he would need to consider a referral to a hand surgeon.  - f/u in 4 weeks

## 2016-11-11 ENCOUNTER — Other Ambulatory Visit: Payer: Self-pay | Admitting: Family Medicine

## 2016-11-11 DIAGNOSIS — J449 Chronic obstructive pulmonary disease, unspecified: Secondary | ICD-10-CM

## 2016-11-11 MED FILL — BENZONATATE 100 MG CAPSULE: 100 | 10 days supply | Qty: 30 | Fill #0

## 2016-11-11 MED FILL — SERTRALINE HCL 100 MG TAB: 100 | 30 days supply | Qty: 30 | Fill #1

## 2016-11-18 ENCOUNTER — Encounter: Payer: Self-pay | Admitting: Pulmonary Disease

## 2016-11-18 ENCOUNTER — Ambulatory Visit (INDEPENDENT_AMBULATORY_CARE_PROVIDER_SITE_OTHER): Payer: Medicaid Other | Admitting: Pulmonary Disease

## 2016-11-18 VITALS — BP 128/82 | HR 60 | Ht 74.0 in | Wt 214.4 lb

## 2016-11-18 DIAGNOSIS — J449 Chronic obstructive pulmonary disease, unspecified: Secondary | ICD-10-CM

## 2016-11-18 DIAGNOSIS — J432 Centrilobular emphysema: Secondary | ICD-10-CM | POA: Diagnosis not present

## 2016-11-18 DIAGNOSIS — G4733 Obstructive sleep apnea (adult) (pediatric): Secondary | ICD-10-CM | POA: Diagnosis not present

## 2016-11-18 MED ORDER — FLUTICASONE FUROATE-VILANTEROL 100-25 MCG/INH IN AEPB: 1.0000 | INHALATION_SPRAY | Freq: Every day | RESPIRATORY_TRACT | 5 refills | Status: DC

## 2016-11-18 MED FILL — !BREO ELLIPTA 100-25 MCG IN: 100-25 | 30 days supply | Qty: 60 | Fill #0

## 2016-11-18 NOTE — Patient Instructions (Signed)
Breo one puff daily >> rinse mouth after each use  Spiriva one puff daily  Albuterol every 4 to 6 hours as needed for cough, wheeze, or chest congestion  Will arrange for CPAP titration study  Will call to schedule follow up after CPAP titration study reviewed

## 2016-11-18 NOTE — Progress Notes (Signed)
   Subjective:    Patient ID: Wesley [name], male    DOB: Jul 24, 1956, 60 y.o.   MRN: [id number]  HPI    Review of Systems  Constitutional: Negative for fever and unexpected weight change.  HENT: Negative for congestion, dental problem, ear pain, nosebleeds, postnasal drip, rhinorrhea, sinus pressure, sneezing, sore throat and trouble swallowing.   Eyes: Negative for redness and itching.  Respiratory: Positive for cough, shortness of breath and wheezing. Negative for chest tightness.        Little hemoptysis in sputum  Cardiovascular: Negative for palpitations and leg swelling.  Gastrointestinal: Negative for nausea and vomiting.  Genitourinary: Negative for dysuria.  Musculoskeletal: Negative for joint swelling.  Skin: Negative for rash.  Neurological: Negative for headaches.  Hematological: Does not bruise/bleed easily.  Psychiatric/Behavioral: Positive for dysphoric mood. The patient is nervous/anxious.        Objective:   Physical Exam        Assessment & Plan:

## 2016-11-18 NOTE — Progress Notes (Signed)
Past Surgical History He  has a past surgical history that includes Pacemaker insertion; Cardiac catheterization; Lung surgery; and Abdominal exploration surgery.  Allergies  Allergen Reactions  . Penicillins Swelling    Has patient had a PCN reaction causing immediate rash, facial/tongue/throat swelling, SOB or lightheadedness with hypotension: unknown Has patient had a PCN reaction causing severe rash involving mucus membranes or skin necrosis: unknown Has patient had a PCN reaction that required hospitalization : unknown Has patient had a PCN reaction occurring within the last 10 years: no If all of the above answers are "NO", then may proceed with Cephalosporin use.     Family History His family history includes Cancer in his brother, father, mother, and sister; Glaucoma in his brother.  Social History He  reports that he quit smoking about 3 months ago. His smoking use included Cigarettes. He has a 40.00 pack-year smoking history. He has never used smokeless tobacco. He reports that he does not drink alcohol or use drugs.  Review of systems Constitutional: Negative for fever and unexpected weight change.  HENT: Negative for congestion, dental problem, ear pain, nosebleeds, postnasal drip, rhinorrhea, sinus pressure, sneezing, sore throat and trouble swallowing.   Eyes: Negative for redness and itching.  Respiratory: Positive for cough, shortness of breath and wheezing. Negative for chest tightness.        Little hemoptysis in sputum  Cardiovascular: Negative for palpitations and leg swelling.  Gastrointestinal: Negative for nausea and vomiting.  Genitourinary: Negative for dysuria.  Musculoskeletal: Negative for joint swelling.  Skin: Negative for rash.  Neurological: Negative for headaches.  Hematological: Does not bruise/bleed easily.  Psychiatric/Behavioral: Positive for dysphoric mood. The patient is nervous/anxious.     Current Outpatient Prescriptions on File Prior to  Visit  Medication Sig  . albuterol (PROVENTIL HFA;VENTOLIN HFA) 108 (90 Base) MCG/ACT inhaler Inhale 2 puffs into the lungs every 6 (six) hours as needed for wheezing or shortness of breath.  Marland Kitchen albuterol (PROVENTIL) (2.5 MG/3ML) 0.083% nebulizer solution Take 3 mLs (2.5 mg total) by nebulization every 6 (six) hours as needed for wheezing or shortness of breath.  Marland Kitchen aspirin 325 MG tablet Take 325 mg by mouth daily.  Marland Kitchen atorvastatin (LIPITOR) 40 MG tablet Take 1 tablet (40 mg total) by mouth daily at 6 PM.  . benzonatate (TESSALON) 100 MG capsule TAKE 1 CAPSULE BY MOUTH 3 TIMES DAILY AS NEEDED FOR COUGH.  . Blood Glucose Monitoring Suppl (ACCU-CHEK AVIVA PLUS) w/Device KIT 1 Device by Does not apply route 4 (four) times daily.  . cetirizine (ZYRTEC) 10 MG tablet Take 1 tablet (10 mg total) by mouth daily.  . Cholecalciferol (VITAMIN D3) 2000 UNITS TABS Take 2,000 Units by mouth daily. (Patient taking differently: Take 2,000 Units by mouth every morning. )  . Elastic Bandages & Supports (WRIST SPLINT/ELASTIC LEFT LG) MISC 1 each by Does not apply route daily.  . Elastic Bandages & Supports (WRIST SPLINT/ELASTIC RIGHT LG) MISC 1 each by Does not apply route at bedtime.  . fluticasone (FLONASE) 50 MCG/ACT nasal spray Place 2 sprays into both nostrils daily. (Patient taking differently: Place 2 sprays into both nostrils daily as needed for allergies. )  . glucose blood (ACCU-CHEK AVIVA) test strip Use as instructed  . Lancets (ACCU-CHEK SOFT TOUCH) lancets Use as instructed  . lisinopril (PRINIVIL,ZESTRIL) 40 MG tablet Take 40 mg by mouth daily.   . meclizine (ANTIVERT) 25 MG tablet Take 1 tablet (25 mg total) by mouth 2 (two) times daily as needed.  Marland Kitchen  meloxicam (MOBIC) 15 MG tablet Take 1 tablet (15 mg total) by mouth daily. (Patient taking differently: Take 15 mg by mouth every morning. )  . metFORMIN (GLUCOPHAGE) 500 MG tablet Take 1 tablet (500 mg total) by mouth 2 (two) times daily with a meal.  .  metoprolol tartrate (LOPRESSOR) 25 MG tablet   . pantoprazole (PROTONIX) 40 MG tablet Take 1 tablet (40 mg total) by mouth daily. (Patient taking differently: Take 40 mg by mouth every morning. )  . sertraline (ZOLOFT) 100 MG tablet Take 100 mg by mouth daily.  . tamsulosin (FLOMAX) 0.4 MG CAPS capsule 0.4 mg by mouth nightly after supper for two weeks, then 0.8 mg nightly  . tiotropium (SPIRIVA) 18 MCG inhalation capsule Place 18 mcg into inhaler and inhale daily.   No current facility-administered medications on file prior to visit.     Chief Complaint  Patient presents with  . SLEEP CONSULT    Referred by Dr Adrian Blackwater. Epworth Score: 4    Pulmonary tests PFT 04/17/16 >> FEV1 2.24 (61%), FEV1% 59, TLC 6.73 (86%), DLCO 46%, +BD  Sleep tests PSG 08/15/16 >> AHI 27, SpO2 low 83%  Cardiac tests Echo 08/30/16 >> EF 60 to 65%  Past medical history He  has a past medical history of Arthritis; Blood transfusion without reported diagnosis; BPH (benign prostatic hyperplasia); Cataract; Chronic insomnia (03/31/2016); COPD (chronic obstructive pulmonary disease) ([place]); Hyperlipidemia; Hypertension; Left-sided weakness (08/28/2016); Myocardial infarction; Pacemaker; Shortness of breath; Stroke ([place]); Substance abuse; Tremor, essential (03/31/2016); Tremors of nervous system; and Tuberculosis.  Vital signs BP 128/82 (BP Location: Left Arm, Cuff Size: Normal)   Pulse 60   Ht '6\' 2"'  (1.88 m)   Wt 214 lb 6.4 oz (97.3 kg)   SpO2 97%   BMI 27.53 kg/m   History of Present Illness Wesley [name] is a 60 y.o. male smoker for evaluation of obstructive sleep apnea and COPD with emphysema.  He had a stroke recently.  He has trouble falling asleep, and staying asleep.  He is sleepy all the time during the day, but can't nap.  He snores and has been told he stops breathing while asleep.  He had sleep study in September 2017 and was found to have moderate sleep apnea.  He has not been started on therapy  yet.  He goes to sleep at [employer].  He falls asleep after 30 minutes to 1 hour.  He wakes up several times to use the bathroom.  He gets out of bed between 8 and 9 am.  He feels tired in the morning.  He denies morning headache.  He does not use anything to help him fall sleep.  He drinks a big cup of coffee in the morning.  He denies sleep walking, sleep talking, bruxism, or nightmares.  There is no history of restless legs.  He denies sleep hallucinations, sleep paralysis, or cataplexy.  The Epworth score is 4 out of 24.  He was in the [employer] until the early 1980's.  He thinks he had respiratory exposure while stationed at [employer].  He has noticed breathing problems since then, and this has gotten progressively worse.  He has cough with wheeze and sputum.  He gets short of breath after walking up 1 flight of stairs.  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  He denies history of pneumonia.  He is retired.  He used to work in a [employer], and Runner, broadcasting/film/video.  He still  smokes 1 or 2 cigarettes per day. He has been using spiriva and albuterol >> these help, but he feels like he still needs more medicine to help his breathing.    [facility]   Physical Exam:  General - No distress ENT - No sinus tenderness, no oral exudate, no LAN, no thyromegaly, TM clear, pupils equal/reactive Cardiac - s1s2 regular, no murmur, pulses symmetric Chest - No wheeze/rales/dullness, good air entry, normal respiratory excursion Back - No focal tenderness Abd - Soft, non-tender, no organomegaly, + bowel sounds Ext - No edema Neuro - Normal strength, cranial nerves intact Skin - No rashes Psych - Normal mood, and behavior  Discussion: He has snoring, sleep disruption, apnea, and daytime sleepiness.  He has hx of CVA, and HTN.  His recent sleep study showed moderate obstructive sleep apnea.  We discussed how sleep apnea can affect various health  problems, including risks for hypertension, cardiovascular disease, and diabetes.  We also discussed how sleep disruption can increase risks for accidents, such as while driving.  Weight loss as a means of improving sleep apnea was also reviewed.  Additional treatment options discussed were CPAP therapy, oral appliance, and surgical intervention.  He has history of smoking.  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  His PFT is consistent with moderate COPD with emphysema and bronchodilator responsiveness.  His recent chest xray showed changes suggestive of bullous emphysema.  He has been on inhaler therapy, but continues to have respiratory symptoms.   Assessment/plan:  Obstructive sleep apnea. - will arrange for in lab CPAP titration study  COPD with emphysema and bronchodilator responsiveness. - add breo - continue spiriva and prn albuterol - check A1AT level - will discuss pulmonary rehab at next visit - discussed need for annual influenza vaccination, and pneumonia vaccinations  Tobacco abuse. - emphasized absolute need to not smoke cigarettes at all   Patient Instructions  Breo one puff daily >> rinse mouth after each use  Spiriva one puff daily  Albuterol every 4 to 6 hours as needed for cough, wheeze, or chest congestion  Will arrange for CPAP titration study  Will call to schedule follow up after CPAP titration study reviewed    Chesley Mires, M.D. Pager [phone] 11/18/2016, 11:44 AM

## 2016-12-19 ENCOUNTER — Encounter (HOSPITAL_COMMUNITY): Payer: Self-pay | Admitting: Emergency Medicine

## 2016-12-19 ENCOUNTER — Emergency Department (HOSPITAL_COMMUNITY): Payer: Medicaid Other

## 2016-12-19 ENCOUNTER — Inpatient Hospital Stay (HOSPITAL_COMMUNITY): Payer: Medicaid Other

## 2016-12-19 ENCOUNTER — Inpatient Hospital Stay (HOSPITAL_COMMUNITY)
Admission: EM | Admit: 2016-12-19 | Discharge: 2016-12-22 | DRG: 392 | Disposition: A | Discharge status: Home / Self Care | Payer: Medicaid Other | Attending: Family Medicine | Admitting: Family Medicine

## 2016-12-19 DIAGNOSIS — I10 Essential (primary) hypertension: Secondary | ICD-10-CM | POA: Diagnosis present

## 2016-12-19 DIAGNOSIS — K648 Other hemorrhoids: Secondary | ICD-10-CM | POA: Diagnosis present

## 2016-12-19 DIAGNOSIS — E559 Vitamin D deficiency, unspecified: Secondary | ICD-10-CM | POA: Diagnosis present

## 2016-12-19 DIAGNOSIS — G4733 Obstructive sleep apnea (adult) (pediatric): Secondary | ICD-10-CM | POA: Diagnosis present

## 2016-12-19 DIAGNOSIS — R404 Transient alteration of awareness: Secondary | ICD-10-CM | POA: Diagnosis not present

## 2016-12-19 DIAGNOSIS — K567 Ileus, unspecified: Secondary | ICD-10-CM | POA: Diagnosis present

## 2016-12-19 DIAGNOSIS — J449 Chronic obstructive pulmonary disease, unspecified: Secondary | ICD-10-CM | POA: Diagnosis present

## 2016-12-19 DIAGNOSIS — Z87898 Personal history of other specified conditions: Secondary | ICD-10-CM | POA: Diagnosis not present

## 2016-12-19 DIAGNOSIS — K219 Gastro-esophageal reflux disease without esophagitis: Secondary | ICD-10-CM | POA: Diagnosis present

## 2016-12-19 DIAGNOSIS — E876 Hypokalemia: Secondary | ICD-10-CM | POA: Diagnosis present

## 2016-12-19 DIAGNOSIS — R1032 Left lower quadrant pain: Secondary | ICD-10-CM | POA: Diagnosis present

## 2016-12-19 DIAGNOSIS — E119 Type 2 diabetes mellitus without complications: Secondary | ICD-10-CM

## 2016-12-19 DIAGNOSIS — H518 Other specified disorders of binocular movement: Secondary | ICD-10-CM | POA: Diagnosis not present

## 2016-12-19 DIAGNOSIS — G250 Essential tremor: Secondary | ICD-10-CM | POA: Diagnosis present

## 2016-12-19 DIAGNOSIS — K5732 Diverticulitis of large intestine without perforation or abscess without bleeding: Secondary | ICD-10-CM | POA: Diagnosis present

## 2016-12-19 DIAGNOSIS — Z7984 Long term (current) use of oral hypoglycemic drugs: Secondary | ICD-10-CM | POA: Diagnosis not present

## 2016-12-19 DIAGNOSIS — I252 Old myocardial infarction: Secondary | ICD-10-CM | POA: Diagnosis not present

## 2016-12-19 DIAGNOSIS — H532 Diplopia: Secondary | ICD-10-CM | POA: Diagnosis not present

## 2016-12-19 DIAGNOSIS — Z8673 Personal history of transient ischemic attack (TIA), and cerebral infarction without residual deficits: Secondary | ICD-10-CM | POA: Diagnosis not present

## 2016-12-19 DIAGNOSIS — F1911 Other psychoactive substance abuse, in remission: Secondary | ICD-10-CM

## 2016-12-19 DIAGNOSIS — N400 Enlarged prostate without lower urinary tract symptoms: Secondary | ICD-10-CM | POA: Diagnosis present

## 2016-12-19 DIAGNOSIS — R079 Chest pain, unspecified: Secondary | ICD-10-CM

## 2016-12-19 DIAGNOSIS — E785 Hyperlipidemia, unspecified: Secondary | ICD-10-CM | POA: Diagnosis present

## 2016-12-19 DIAGNOSIS — K5792 Diverticulitis of intestine, part unspecified, without perforation or abscess without bleeding: Secondary | ICD-10-CM | POA: Diagnosis present

## 2016-12-19 DIAGNOSIS — I495 Sick sinus syndrome: Secondary | ICD-10-CM | POA: Diagnosis present

## 2016-12-19 DIAGNOSIS — R55 Syncope and collapse: Secondary | ICD-10-CM | POA: Diagnosis present

## 2016-12-19 DIAGNOSIS — N529 Male erectile dysfunction, unspecified: Secondary | ICD-10-CM | POA: Diagnosis present

## 2016-12-19 DIAGNOSIS — Z87891 Personal history of nicotine dependence: Secondary | ICD-10-CM

## 2016-12-19 DIAGNOSIS — Z950 Presence of cardiac pacemaker: Secondary | ICD-10-CM | POA: Diagnosis present

## 2016-12-19 DIAGNOSIS — E669 Obesity, unspecified: Secondary | ICD-10-CM

## 2016-12-19 DIAGNOSIS — E1169 Type 2 diabetes mellitus with other specified complication: Secondary | ICD-10-CM

## 2016-12-19 DIAGNOSIS — Z7982 Long term (current) use of aspirin: Secondary | ICD-10-CM

## 2016-12-19 DIAGNOSIS — K635 Polyp of colon: Secondary | ICD-10-CM | POA: Diagnosis present

## 2016-12-19 DIAGNOSIS — Z87440 Personal history of urinary (tract) infections: Secondary | ICD-10-CM

## 2016-12-19 LAB — CBC
HCT: 45.3 % (ref 39.0–52.0)
HEMOGLOBIN: 15.1 g/dL (ref 13.0–17.0)
MCH: 27 pg (ref 26.0–34.0)
MCHC: 33.3 g/dL (ref 30.0–36.0)
MCV: 80.9 fL (ref 78.0–100.0)
PLATELETS: 162 10*3/uL (ref 150–400)
RBC: 5.6 MIL/uL (ref 4.22–5.81)
RDW: 14.7 % (ref 11.5–15.5)
WBC: 6.7 10*3/uL (ref 4.0–10.5)

## 2016-12-19 LAB — I-STAT CHEM 8, ED
BUN: 18 mg/dL (ref 6–20)
Calcium, Ion: 1.08 mmol/L — ABNORMAL LOW (ref 1.15–1.40)
Chloride: 101 mmol/L (ref 101–111)
Creatinine, Ser: 0.7 mg/dL (ref 0.61–1.24)
Glucose, Bld: 116 mg/dL — ABNORMAL HIGH (ref 65–99)
HCT: 48 % (ref 39.0–52.0)
Hemoglobin: 16.3 g/dL (ref 13.0–17.0)
Potassium: 3.9 mmol/L (ref 3.5–5.1)
SODIUM: 135 mmol/L (ref 135–145)
TCO2: 22 mmol/L (ref 0–100)

## 2016-12-19 LAB — TROPONIN I: Troponin I: <0.03 ng/mL (ref <0.03)

## 2016-12-19 LAB — COMPREHENSIVE METABOLIC PANEL
ALK PHOS: 101 U/L (ref 38–126)
ALT: 33 U/L (ref 17–63)
ANION GAP: 10 (ref 5–15)
AST: 19 U/L (ref 15–41)
Albumin: 4.2 g/dL (ref 3.5–5.0)
BILIRUBIN TOTAL: 0.7 mg/dL (ref 0.3–1.2)
BUN: 19 mg/dL (ref 6–20)
CALCIUM: 8.9 mg/dL (ref 8.9–10.3)
CO2: 23 mmol/L (ref 22–32)
Chloride: 102 mmol/L (ref 101–111)
Creatinine, Ser: 0.88 mg/dL (ref 0.61–1.24)
Glucose, Bld: 116 mg/dL — ABNORMAL HIGH (ref 65–99)
Potassium: 4 mmol/L (ref 3.5–5.1)
Sodium: 135 mmol/L (ref 135–145)
TOTAL PROTEIN: 7.3 g/dL (ref 6.5–8.1)

## 2016-12-19 LAB — URINALYSIS, ROUTINE W REFLEX MICROSCOPIC
BILIRUBIN URINE: NEGATIVE
Glucose, UA: NEGATIVE mg/dL
Hgb urine dipstick: NEGATIVE
Ketones, ur: NEGATIVE mg/dL
Leukocytes, UA: NEGATIVE
NITRITE: NEGATIVE
PROTEIN: NEGATIVE mg/dL
SPECIFIC GRAVITY, URINE: 1.027 (ref 1.005–1.030)
pH: 5 (ref 5.0–8.0)

## 2016-12-19 LAB — GLUCOSE, CAPILLARY: Glucose-Capillary: 108 mg/dL — ABNORMAL HIGH (ref 65–99)

## 2016-12-19 LAB — LIPASE, BLOOD: Lipase: 19 U/L (ref 11–51)

## 2016-12-19 LAB — I-STAT CG4 LACTIC ACID, ED
LACTIC ACID, VENOUS: 1.7 mmol/L (ref 0.5–1.9)
LACTIC ACID, VENOUS: 1.01 mmol/L (ref 0.5–1.9)

## 2016-12-19 MED ORDER — HYDROCODONE-ACETAMINOPHEN 5-325 MG PO TABS
1.0000 | ORAL_TABLET | ORAL | Status: DC | PRN
  Administered 2016-12-19 – 2016-12-22 (×5): 2 via ORAL
  Filled 2016-12-19 (×5): qty 2

## 2016-12-19 MED ORDER — CIPROFLOXACIN IN D5W 400 MG/200ML IV SOLN
400.0000 mg | Freq: Two times a day (BID) | INTRAVENOUS | Status: DC
  Administered 2016-12-19 – 2016-12-21 (×5): 400 mg via INTRAVENOUS
  Filled 2016-12-19 (×5): qty 200

## 2016-12-19 MED ORDER — ONDANSETRON HCL 4 MG/2ML IJ SOLN: 4.0000 mg | Freq: Four times a day (QID) | INTRAMUSCULAR | Status: DC | PRN

## 2016-12-19 MED ORDER — GUAIFENESIN 100 MG/5ML PO SOLN: 10.0000 mL | ORAL | Status: DC | PRN

## 2016-12-19 MED ORDER — METRONIDAZOLE IN NACL 5-0.79 MG/ML-% IV SOLN
500.0000 mg | Freq: Once | INTRAVENOUS | Status: AC
  Administered 2016-12-19: 500 mg via INTRAVENOUS
  Filled 2016-12-19: qty 100

## 2016-12-19 MED ORDER — SODIUM CHLORIDE 0.9 % IV BOLUS (SEPSIS)
1000.0000 mL | Freq: Once | INTRAVENOUS | Status: AC
  Administered 2016-12-19: 1000 mL via INTRAVENOUS

## 2016-12-19 MED ORDER — FLUTICASONE FUROATE-VILANTEROL 100-25 MCG/INH IN AEPB
1.0000 | INHALATION_SPRAY | Freq: Every day | RESPIRATORY_TRACT | Status: DC
  Administered 2016-12-20 – 2016-12-22 (×3): 1 via RESPIRATORY_TRACT
  Filled 2016-12-19: qty 28

## 2016-12-19 MED ORDER — IPRATROPIUM-ALBUTEROL 0.5-2.5 (3) MG/3ML IN SOLN
3.0000 mL | Freq: Two times a day (BID) | RESPIRATORY_TRACT | Status: DC
  Administered 2016-12-20 – 2016-12-21 (×4): 3 mL via RESPIRATORY_TRACT
  Filled 2016-12-19 (×4): qty 3

## 2016-12-19 MED ORDER — ALBUTEROL SULFATE (2.5 MG/3ML) 0.083% IN NEBU: 2.5000 mg | INHALATION_SOLUTION | Freq: Four times a day (QID) | RESPIRATORY_TRACT | Status: DC | PRN

## 2016-12-19 MED ORDER — VITAMIN D 1000 UNITS PO TABS
2000.0000 [IU] | ORAL_TABLET | ORAL | Status: DC
  Administered 2016-12-20 – 2016-12-22 (×3): 2000 [IU] via ORAL
  Filled 2016-12-19 (×4): qty 2

## 2016-12-19 MED ORDER — ENOXAPARIN SODIUM 40 MG/0.4ML ~~LOC~~ SOLN
40.0000 mg | SUBCUTANEOUS | Status: DC
  Administered 2016-12-19 – 2016-12-21 (×3): 40 mg via SUBCUTANEOUS
  Filled 2016-12-19 (×3): qty 0.4

## 2016-12-19 MED ORDER — IOPAMIDOL (ISOVUE-370) INJECTION 76%
INTRAVENOUS | Status: AC
  Administered 2016-12-19: 100 mL
  Filled 2016-12-19: qty 100

## 2016-12-19 MED ORDER — PANTOPRAZOLE SODIUM 40 MG PO TBEC
40.0000 mg | DELAYED_RELEASE_TABLET | ORAL | Status: DC
  Administered 2016-12-20 – 2016-12-22 (×3): 40 mg via ORAL
  Filled 2016-12-19 (×3): qty 1

## 2016-12-19 MED ORDER — ONDANSETRON HCL 4 MG PO TABS: 4.0000 mg | ORAL_TABLET | Freq: Four times a day (QID) | ORAL | Status: DC | PRN

## 2016-12-19 MED ORDER — IOPAMIDOL (ISOVUE-300) INJECTION 61%
100.0000 mL | Freq: Once | INTRAVENOUS | Status: AC | PRN
  Administered 2016-12-19: 100 mL via INTRAVENOUS

## 2016-12-19 MED ORDER — SERTRALINE HCL 100 MG PO TABS
100.0000 mg | ORAL_TABLET | Freq: Every day | ORAL | Status: DC
  Administered 2016-12-19 – 2016-12-22 (×4): 100 mg via ORAL
  Filled 2016-12-19 (×4): qty 1

## 2016-12-19 MED ORDER — ASPIRIN 325 MG PO TABS
325.0000 mg | ORAL_TABLET | Freq: Every day | ORAL | Status: DC
  Administered 2016-12-19 – 2016-12-22 (×4): 325 mg via ORAL
  Filled 2016-12-19 (×4): qty 1

## 2016-12-19 MED ORDER — HYDROMORPHONE HCL 1 MG/ML IJ SOLN
0.5000 mg | Freq: Once | INTRAMUSCULAR | Status: AC
  Administered 2016-12-19: 0.5 mg via INTRAVENOUS
  Filled 2016-12-19: qty 1

## 2016-12-19 MED ORDER — METRONIDAZOLE 500 MG PO TABS
500.0000 mg | ORAL_TABLET | Freq: Three times a day (TID) | ORAL | Status: DC
  Administered 2016-12-19 – 2016-12-21 (×6): 500 mg via ORAL
  Filled 2016-12-19 (×6): qty 1

## 2016-12-19 MED ORDER — SODIUM CHLORIDE 0.9% FLUSH
3.0000 mL | Freq: Two times a day (BID) | INTRAVENOUS | Status: DC
  Administered 2016-12-19 – 2016-12-22 (×5): 3 mL via INTRAVENOUS

## 2016-12-19 MED ORDER — METOPROLOL TARTRATE 25 MG PO TABS
25.0000 mg | ORAL_TABLET | Freq: Every day | ORAL | Status: DC
  Administered 2016-12-20 – 2016-12-22 (×3): 25 mg via ORAL
  Filled 2016-12-19 (×4): qty 1

## 2016-12-19 MED ORDER — LORATADINE 10 MG PO TABS
10.0000 mg | ORAL_TABLET | Freq: Every day | ORAL | Status: DC
  Administered 2016-12-19 – 2016-12-22 (×4): 10 mg via ORAL
  Filled 2016-12-19 (×4): qty 1

## 2016-12-19 MED ORDER — POTASSIUM CHLORIDE IN NACL 20-0.9 MEQ/L-% IV SOLN
INTRAVENOUS | Status: DC
  Administered 2016-12-19: 16:00:00 via INTRAVENOUS
  Filled 2016-12-19 (×2): qty 1000

## 2016-12-19 MED ORDER — GI COCKTAIL ~~LOC~~
30.0000 mL | Freq: Once | ORAL | Status: AC
  Administered 2016-12-19: 30 mL via ORAL
  Filled 2016-12-19: qty 30

## 2016-12-19 MED ORDER — IOPAMIDOL (ISOVUE-300) INJECTION 61%
INTRAVENOUS | Status: AC
  Filled 2016-12-19: qty 100

## 2016-12-19 MED ORDER — ONDANSETRON HCL 4 MG/2ML IJ SOLN
4.0000 mg | Freq: Once | INTRAMUSCULAR | Status: AC
  Administered 2016-12-19: 4 mg via INTRAVENOUS
  Filled 2016-12-19: qty 2

## 2016-12-19 MED ORDER — ACETAMINOPHEN 650 MG RE SUPP: 650.0000 mg | Freq: Four times a day (QID) | RECTAL | Status: DC | PRN

## 2016-12-19 MED ORDER — HYDROMORPHONE HCL 1 MG/ML IJ SOLN
1.0000 mg | Freq: Once | INTRAMUSCULAR | Status: AC
  Administered 2016-12-19: 1 mg via INTRAVENOUS
  Filled 2016-12-19: qty 1

## 2016-12-19 MED ORDER — ATORVASTATIN CALCIUM 40 MG PO TABS
40.0000 mg | ORAL_TABLET | Freq: Every day | ORAL | Status: DC
  Administered 2016-12-20 – 2016-12-21 (×2): 40 mg via ORAL
  Filled 2016-12-19 (×3): qty 1

## 2016-12-19 MED ORDER — TAMSULOSIN HCL 0.4 MG PO CAPS
0.8000 mg | ORAL_CAPSULE | Freq: Every day | ORAL | Status: DC
  Administered 2016-12-19 – 2016-12-21 (×3): 0.8 mg via ORAL
  Filled 2016-12-19 (×3): qty 2

## 2016-12-19 MED ORDER — ACETAMINOPHEN 325 MG PO TABS: 650.0000 mg | ORAL_TABLET | Freq: Four times a day (QID) | ORAL | Status: DC | PRN

## 2016-12-19 MED ORDER — IPRATROPIUM-ALBUTEROL 0.5-2.5 (3) MG/3ML IN SOLN
3.0000 mL | Freq: Three times a day (TID) | RESPIRATORY_TRACT | Status: DC
  Administered 2016-12-19 (×2): 3 mL via RESPIRATORY_TRACT
  Filled 2016-12-19 (×2): qty 3

## 2016-12-19 MED ORDER — INSULIN ASPART 100 UNIT/ML ~~LOC~~ SOLN
0.0000 [IU] | Freq: Three times a day (TID) | SUBCUTANEOUS | Status: DC
  Administered 2016-12-20: 1 [IU] via SUBCUTANEOUS
  Administered 2016-12-21 – 2016-12-22 (×2): 2 [IU] via SUBCUTANEOUS

## 2016-12-19 MED ORDER — LORAZEPAM 2 MG/ML IJ SOLN
1.0000 mg | Freq: Once | INTRAMUSCULAR | Status: AC
  Administered 2016-12-19: 1 mg via INTRAVENOUS
  Filled 2016-12-19: qty 1

## 2016-12-19 MED ORDER — CIPROFLOXACIN IN D5W 400 MG/200ML IV SOLN
400.0000 mg | Freq: Once | INTRAVENOUS | Status: AC
  Administered 2016-12-19: 400 mg via INTRAVENOUS
  Filled 2016-12-19: qty 200

## 2016-12-19 MED ORDER — LISINOPRIL 40 MG PO TABS
40.0000 mg | ORAL_TABLET | Freq: Every day | ORAL | Status: DC
Start: 2016-12-19 — End: 2016-12-22
  Administered 2016-12-20 – 2016-12-22 (×3): 40 mg via ORAL
  Filled 2016-12-19 (×4): qty 1

## 2016-12-19 NOTE — ED Notes (Signed)
Pt in CT.

## 2016-12-19 NOTE — ED Notes (Addendum)
Radiology states that family is requesting CT of head on patient due to him starring off the past 4 days and has PMH of stroke.  Wife states that she states last normal was New Year's Day.  Made Dr Tyrone Nine aware of request.
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
## 2016-12-19 NOTE — ED Notes (Signed)
[facility] contacted for transport to [facility]. Spoke to [employer]

## 2016-12-19 NOTE — ED Notes (Signed)
Family at bedside. 
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
## 2016-12-19 NOTE — ED Notes (Signed)
Respiratory paged about breathing tx and pharmacy informed about medications being released.

## 2016-12-19 NOTE — ED Triage Notes (Addendum)
Pt c/o abdominal pain accompanied by N/V/D that began this am around 2am; reports 8 episodes of diarrhea and 2 of emesis; pt clenching stomach in triage; abdomen is taut upon palpation

## 2016-12-19 NOTE — ED Notes (Signed)
Dr Tat at bedside 

## 2016-12-19 NOTE — ED Notes (Signed)
Bed: WA04 Expected date:  Expected time:  Means of arrival:  Comments: 

## 2016-12-19 NOTE — ED Provider Notes (Signed)
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 (not administered)  metroNIDAZOLE (FLAGYL) tablet 500 mg (not administered)  ipratropium-albuterol (DUONEB) 0.5-2.5 (3) MG/3ML nebulizer solution 3 mL (3 mLs Nebulization Given 12/19/16 1517)  sodium chloride 0.9 % bolus 1,000 mL (0 mLs Intravenous Stopped 12/19/16 0850)  HYDROmorphone (DILAUDID) injection 1 mg (1 mg Intravenous Given 12/19/16 0741)  ondansetron (ZOFRAN) injection 4 mg (4 mg Intravenous Given 12/19/16 0740)  iopamidol (ISOVUE-300) 61 % injection 100 mL (100 mLs Intravenous Contrast Given 12/19/16 0825)  sodium chloride 0.9 % bolus 1,000 mL (0 mLs Intravenous Stopped 12/19/16 1239)  ciprofloxacin (CIPRO) IVPB 400 mg (0 mg Intravenous Stopped 12/19/16 1320)  metroNIDAZOLE (FLAGYL) IVPB 500 mg (0 mg Intravenous Stopped 12/19/16 1458)  HYDROmorphone (DILAUDID) injection 0.5 mg (0.5 mg Intravenous Given 12/19/16 1158)  iopamidol (ISOVUE-370) 76 % injection (100 mLs  Contrast Given 12/19/16 1133)  gi cocktail (Maalox,Lidocaine,Donnatal) (30 mLs Oral Given 12/19/16 1514)    Vitals:   12/19/16 1200 12/19/16 1337 12/19/16 1512 12/19/16 1517  BP: 133/90 119/85 134/78   Pulse: 64 60 (!) 59    Resp: 18 16    Temp:  98.7 F (37.1 C)    TempSrc:  Oral    SpO2: 96% 94%  94%    Final diagnoses:  Diverticulitis of large intestine without perforation or abscess without bleeding  Dysconjugate gaze    Admission/ observation were discussed with the admitting physician, patient and/or family and they are comfortable with the plan.    Final Clinical Impressions(s) / ED Diagnoses   Final diagnoses:  Diverticulitis of large intestine without perforation or abscess without bleeding  Dysconjugate gaze    New Prescriptions New Prescriptions   No medications on file     Deno Etienne, DO 12/19/16 1539

## 2016-12-20 ENCOUNTER — Inpatient Hospital Stay (HOSPITAL_COMMUNITY): Payer: Medicaid Other

## 2016-12-20 DIAGNOSIS — H532 Diplopia: Secondary | ICD-10-CM

## 2016-12-20 DIAGNOSIS — H518 Other specified disorders of binocular movement: Secondary | ICD-10-CM

## 2016-12-20 LAB — CBC
HCT: 40.4 % (ref 39.0–52.0)
Hemoglobin: 13.1 g/dL (ref 13.0–17.0)
MCH: 26.8 pg (ref 26.0–34.0)
MCHC: 32.4 g/dL (ref 30.0–36.0)
MCV: 82.6 fL (ref 78.0–100.0)
PLATELETS: 141 10*3/uL — AB (ref 150–400)
RBC: 4.89 MIL/uL (ref 4.22–5.81)
RDW: 14.5 % (ref 11.5–15.5)
WBC: 3.9 10*3/uL — AB (ref 4.0–10.5)

## 2016-12-20 LAB — GLUCOSE, CAPILLARY
Glucose-Capillary: 113 mg/dL — ABNORMAL HIGH (ref 65–99)
Glucose-Capillary: 147 mg/dL — ABNORMAL HIGH (ref 65–99)
Glucose-Capillary: 116 mg/dL — ABNORMAL HIGH (ref 65–99)
Glucose-Capillary: 91 mg/dL (ref 65–99)

## 2016-12-20 LAB — RAPID URINE DRUG SCREEN, HOSP PERFORMED
AMPHETAMINES: NOT DETECTED
BENZODIAZEPINES: NOT DETECTED
Barbiturates: NOT DETECTED
COCAINE: NOT DETECTED
OPIATES: POSITIVE — AB
Tetrahydrocannabinol: NOT DETECTED

## 2016-12-20 LAB — BASIC METABOLIC PANEL
ANION GAP: 9 (ref 5–15)
BUN: 8 mg/dL (ref 6–20)
CALCIUM: 8.8 mg/dL — AB (ref 8.9–10.3)
CO2: 24 mmol/L (ref 22–32)
CREATININE: 0.76 mg/dL (ref 0.61–1.24)
Chloride: 103 mmol/L (ref 101–111)
GFR calc non Af Amer: >60 mL/min (ref >60)
Glucose, Bld: 110 mg/dL — ABNORMAL HIGH (ref 65–99)
Potassium: 4 mmol/L (ref 3.5–5.1)
SODIUM: 136 mmol/L (ref 135–145)

## 2016-12-20 LAB — TROPONIN I: Troponin I: <0.03 ng/mL

## 2016-12-20 MED ORDER — FAMOTIDINE 40 MG/5ML PO SUSR
40.0000 mg | Freq: Two times a day (BID) | ORAL | Status: DC
  Administered 2016-12-20 – 2016-12-22 (×5): 40 mg via ORAL
  Filled 2016-12-20 (×5): qty 5

## 2016-12-20 MED ORDER — BISACODYL 5 MG PO TBEC: 5.0000 mg | DELAYED_RELEASE_TABLET | Freq: Every day | ORAL | Status: DC | PRN

## 2016-12-20 NOTE — Progress Notes (Signed)
PROGRESS NOTE    Wesley [name]  [id number] DOB: 03-11-1956 DOA: 12/19/2016 PCP: Minerva Ends, MD    Brief Narrative:  61 y/o ? COPD CVA Copd/smoker FEV1 at only 50% of predicted and a good improvement (relative change 18%) after bronchodilators. Dual chamber St Jude assurity PPM 11/2014 for syncopal event placed in [place] s/p stent 07/11/15 Htn HLD BPH + erectile dysfunction Known Colonic polyps/int hemorrhoids  12/2015 + diverticulosis on Colonoscopy -Dr. 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  Older CVA with bilateral cartodi stenosis  Known H/o essential tremor since 2012  On and off h/o syncope  Since 2007--felt orthostatic/vasovagal   Admitted with abd pain and subj fever chills Constant chest burning feeling More drowsy per wife x last 2 days prior to admit Found to have dysconjugate gaze which has been sub-acute over past 6 mo See above salient neurological hisotry  On admission cxr bullous emphysema cta head wnl and no new changes  Assessment & Plan:   Active Problems:   Cardiac pacemaker in situ   Essential hypertension   COPD mixed type ([place])   History of substance abuse   SSS (sick sinus syndrome) ([place])   Diabetes mellitus type 2, controlled ([place])   Acute diverticulitis  Acute diverticulitis + ileus mild  on CT scan 1/5-continue IV Cipro Flagyl, white count not elevated Pain control Norco one to 2 every 4 when necessary-clear liquid diet grad as tol as is hungry. Can give enema and see how he does with that SSS, s/p dual chamber St. Jude pacemaker 11/2014-telemetry monitoring= MI status post stent-continue metoprolol 35 daily, lisinopril 40 daily--troponin are negative and chest burning probably GI without cardiac causes and therefore stopped checking severe reflux already on Protonix 40day, add Pepcid 40mg  2 times a day Mild hypokalemia-d/c saline 75 cc/HR + KCl 20 12/20/16 Repeat labs  a.m. BPH + erectile dysfunction-continue Flomax 0.4 Diabetes mellitus relatively new onset 10/17-CBGs ranging 100 range. 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 Continue Prelone, DuoNeb, Claritin   DVT prophylaxis: Lovenox Code Status: Full Family Communication: none at bedside Disposition Plan: inpatient pending resolution   Consultants:   None yet  Procedures:   none  Antimicrobials:   Cipro 1/5  flagyll 1/5    Subjective:  Feels bloated and awakens with cold sweat Burning in the chest feels like his reflux No chest pain radiation currently Feels that he can eat has not passed a stool   Objective: Vitals:   12/19/16 1700 12/19/16 2031 12/19/16 2134 12/20/16 0441  BP: 124/80  (!) 160/98 133/84  Pulse: 66  65 64  Resp: 16  19 19   Temp:   98.4 F (36.9 C) 98.4 F (36.9 C)  TempSrc:      SpO2: 92% 94% 98% 96%    Intake/Output Summary (Last 24 hours) at 12/20/16 0743 Last data filed at 12/20/16 0600  Gross per 24 hour  Intake  2440 ml  Output              300 ml  Net             2140 ml   There were no vitals filed for this visit.  Examination:  General exam: Appears calm and comfortable  Respiratory system: Clear to auscultation. Respiratory effort normal. Cardiovascular system: S1 & S2 heard, RRR. No JVD, murmurs, rubs, gallops or clicks. No pedal edema. Gastrointestinal system: Abdomen is nondistended, soft and nontender. No organomegaly or masses felt. Normal bowel sounds heard. Central nervous system: Alert and oriented. No focal neurological deficits. Extremities: Symmetric 5 x 5  power. Skin: No rashes, lesions or ulcers Psychiatry: Judgement and insight appear normal. Mood & affect appropriate.     Data Reviewed: I have personally reviewed following labs and imaging studies  CBC:  Recent Labs Lab 12/19/16 0727 12/19/16 0738  WBC 6.7  --   HGB 15.1 16.3  HCT 45.3 48.0  MCV 80.9  --   PLT 162  --    Basic Metabolic Panel:  Recent Labs Lab 12/19/16 0727 12/19/16 0738  NA 135 135  K 4.0 3.9  CL 102 101  CO2 23  --   GLUCOSE 116* 116*  BUN 19 18  CREATININE 0.88 0.70  CALCIUM 8.9  --    GFR: CrCl cannot be calculated (Unknown ideal weight.). Liver Function Tests:  Recent Labs Lab 12/19/16 0727  AST 19  ALT 33  ALKPHOS 101  BILITOT 0.7  PROT 7.3  ALBUMIN 4.2    Recent Labs Lab 12/19/16 0727  LIPASE 19   No results for input(s): AMMONIA in the last 168 hours. Coagulation Profile: No results for input(s): INR, PROTIME in the last 168 hours. Cardiac Enzymes:  Recent Labs Lab 12/19/16 1443  TROPONINI <0.03   BNP (last 3 results) No results for input(s): PROBNP in the last 8760 hours. HbA1C: No results for input(s): HGBA1C in the last 72 hours. CBG:  Recent Labs Lab 12/19/16 2034  GLUCAP 108*   Lipid Profile: No results for input(s): CHOL, HDL, LDLCALC, TRIG, CHOLHDL, LDLDIRECT in the last 72 hours. Thyroid Function Tests: No results for input(s): TSH, T4TOTAL, FREET4, T3FREE, THYROIDAB in the last 72 hours. Anemia Panel: No results for input(s): VITAMINB12, FOLATE, FERRITIN, TIBC, IRON, RETICCTPCT in the last 72 hours. Sepsis Labs:  Recent Labs Lab 12/19/16 0747 12/19/16 1111  LATICACIDVEN 1.70 1.01    No results found for this or any previous visit (from the past 240 hour(s)).       Radiology Studies: Ct Angio Head W Or Wo Contrast  Result Date: 12/19/2016 CLINICAL DATA:  61 year old male with disc conjugate gaze, abnormal vision exam. Initial encounter. EXAM: CT ANGIOGRAPHY HEAD AND NECK TECHNIQUE:  Multidetector CT imaging of the head and neck was performed using the standard protocol during bolus administration of intravenous contrast. Multiplanar CT image reconstructions and MIPs were obtained to evaluate the vascular anatomy. Carotid stenosis measurements (when applicable) are obtained utilizing NASCET criteria, using the distal internal carotid diameter as the denominator. CONTRAST:  100 mL Isovue 370 COMPARISON:  Head CT without contrast 0825 hours today. CTA head and neck 08/29/2016 and 04/05/2016. FINDINGS: CTA NECK Skeleton: Chronic straightening of cervical lordosis. Stable visualized osseous structures. Stable paranasal sinuses and mastoids, mild mucosal thickening. 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 No superior mediastinal lymphadenopathy. Other neck: Subcentimeter posterior right thyroid lobe nodule is stable and does not meet consensus  criteria for ultrasound follow-up. Larynx, pharynx, parapharyngeal spaces, retropharyngeal space, sublingual space, submandibular glands and parotid glands are within normal limits. No cervical lymphadenopathy. Aortic arch: 3 vessel arch configuration. Stable mild arch atherosclerosis, mostly at the great vessel origins. Right carotid system: Stable, no stenosis. Left carotid system: No left CCA origin stenosis despite calcified plaque. Otherwise negative, no stenosis. Vertebral arteries: No proximal right subclavian artery stenosis. The right vertebral artery origin appears normal. Stable and negative cervical right vertebral artery. No proximal left subclavian artery stenosis despite calcified plaque. There is chronic calcified plaque at the left vertebral artery origin which appears stable. There is mild to moderate left vertebral origin stenosis. The proximal left vertebral is mildly obscured by paravertebral venous contrast. But otherwise the left vertebral is normal to the skullbase. CTA HEAD Posterior circulation: Mildly dominant  distal left vertebral artery. No distal vertebral stenosis. Normal left PICA origin. Normal vertebrobasilar junction (small fenestration common normal variant). No basilar artery stenosis. AICA, SCA, and PCA origins are normal. Posterior communicating arteries are diminutive or absent. Bilateral PCA branches appear normal. Anterior circulation: Both ICA siphons remain patent. Bilateral siphon calcified plaque appears stable. ICA siphon detail is superior today compared to 04/05/2016. No hemodynamically significant siphon stenosis is identified despite the calcified plaque. Normal ophthalmic artery origins. Normal carotid termini, MCA and ACA origins. Bilateral ACA branches are normal. Left MCA M1 segment, trifurcation and left MCA branches appear normal. Right MCA M1 segment, bifurcation, and right MCA branches appear normal. Venous sinuses: Patent. Anatomic variants: Mildly dominant left vertebral artery. Delayed phase: No abnormal enhancement identified. Stable CT appearance of the brain. Review of the MIP images confirms the above findings IMPRESSION: 1. Stable neck CTA since April 2017. No emergent large vessel occlusion. 2. No significant carotid atherosclerosis or stenosis in the neck. Extensive Bilateral ICA siphon calcified plaque but no significant stenosis identified. 3. Calcified plaque at the left vertebral artery origin with mild to moderate stenosis. Otherwise negative posterior circulation. 4.  Stable CT appearance of the brain. 5. Bullous emphysema. Electronically Signed   By: Genevie Ann M.D.   On: 12/19/2016 12:15   Dg Chest 2 View  Result Date: 12/19/2016 CLINICAL DATA:  Chest pain EXAM: CHEST  2 VIEW COMPARISON:  09/16/2016 FINDINGS: Heart is normal in size. There is aortic atherosclerosis. No aortic aneurysm. Left-sided pacemaker apparatus is noted with leads projecting over right atrium and coronary sinus. Scarring in the right mid lung. Bullous emphysema in the right upper lobe as before. No  suspicious osseous abnormality. IMPRESSION: Stable bullous emphysematous disease involving the right lung apex with scarring in the right mid lung. No acute cardiopulmonary disease. Electronically Signed   By: Ashley Royalty M.D.   On: 12/19/2016 23:13   Ct Head Wo Contrast  Result Date: 12/19/2016 CLINICAL DATA:  Staring for past 4 days. EXAM: CT HEAD WITHOUT CONTRAST TECHNIQUE: Contiguous axial images were obtained from the base of the skull through the vertex without intravenous contrast. COMPARISON:  08/29/2016 FINDINGS: Brain: No evidence of acute infarction, hemorrhage, hydrocephalus, extra-axial collection or mass lesion/mass effect. Chronic microvascular disease that is prominent for age. Stable pattern of microvascular ischemic gliosis. Vascular: Atherosclerotic calcification. Skull: Negative for fracture or destructive process. Sinuses/Orbits: Bilateral cataract resection. Other: Negative IMPRESSION: 1. No acute finding. 2. Chronic microvascular disease. Electronically Signed   By: Monte Fantasia M.D.   On: 12/19/2016 08:57   Ct Angio Neck W And/or Wo Contrast  Result Date: 12/19/2016 CLINICAL DATA:  61 year old male  with disc conjugate gaze, abnormal vision exam. Initial encounter. EXAM: CT ANGIOGRAPHY HEAD AND NECK TECHNIQUE: Multidetector CT imaging of the head and neck was performed using the standard protocol during bolus administration of intravenous contrast. Multiplanar CT image reconstructions and MIPs were obtained to evaluate the vascular anatomy. Carotid stenosis measurements (when applicable) are obtained utilizing NASCET criteria, using the distal internal carotid diameter as the denominator. CONTRAST:  100 mL Isovue 370 COMPARISON:  Head CT without contrast 0825 hours today. CTA head and neck 08/29/2016 and 04/05/2016. FINDINGS: CTA NECK Skeleton: Chronic straightening of cervical lordosis. Stable visualized osseous structures. Stable paranasal sinuses and mastoids, mild mucosal  thickening. 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 No superior mediastinal lymphadenopathy. Other neck: Subcentimeter posterior right thyroid lobe nodule is stable and does not meet consensus criteria for ultrasound follow-up. Larynx, pharynx, parapharyngeal spaces, retropharyngeal space, sublingual space, submandibular glands and parotid glands are within normal limits. No cervical lymphadenopathy. Aortic arch: 3 vessel arch configuration. Stable mild arch atherosclerosis, mostly at the great vessel origins. Right carotid system: Stable, no stenosis. Left carotid system: No left CCA origin stenosis despite calcified plaque. Otherwise negative, no stenosis. Vertebral arteries: No proximal right subclavian artery stenosis. The right vertebral artery origin appears normal. Stable and negative cervical right vertebral artery. No proximal left subclavian artery stenosis despite calcified plaque. There is chronic calcified plaque at the left vertebral artery origin which appears stable. There is mild to moderate left vertebral origin stenosis. The proximal left vertebral is mildly obscured by paravertebral venous contrast. But otherwise the left vertebral is normal to the skullbase. CTA HEAD Posterior circulation: Mildly dominant distal left vertebral artery. No distal vertebral stenosis. Normal left PICA origin. Normal vertebrobasilar junction (small fenestration common normal variant). No basilar artery stenosis. AICA, SCA, and PCA origins are normal. Posterior communicating arteries are diminutive or absent. Bilateral PCA branches appear normal. Anterior circulation: Both ICA siphons remain patent. Bilateral siphon calcified plaque appears stable. ICA siphon detail is superior today compared to 04/05/2016. No hemodynamically significant siphon stenosis is identified despite the calcified plaque. Normal ophthalmic artery origins. Normal carotid termini, MCA and ACA origins. Bilateral ACA  branches are normal. Left MCA M1 segment, trifurcation and left MCA branches appear normal. Right MCA M1 segment, bifurcation, and right MCA branches appear normal. Venous sinuses: Patent. Anatomic variants: Mildly dominant left vertebral artery. Delayed phase: No abnormal enhancement identified. Stable CT appearance of the brain. Review of the MIP images confirms the above findings IMPRESSION: 1. Stable neck CTA since April 2017. No emergent large vessel occlusion. 2. No significant carotid atherosclerosis or stenosis in the neck. Extensive Bilateral ICA siphon calcified plaque but no significant stenosis identified. 3. Calcified plaque at the left vertebral artery origin with mild to moderate stenosis. Otherwise negative posterior circulation. 4.  Stable CT appearance of the brain. 5. Bullous emphysema. Electronically Signed   By: Genevie Ann M.D.   On: 12/19/2016 12:15   Ct Abdomen Pelvis W Contrast  Result Date: 12/19/2016 CLINICAL DATA:  Lower abdominal pain. Nausea and vomiting with diarrhea since 10 p.m. on 12/18/2016. Headache and confusion. EXAM: CT ABDOMEN AND PELVIS WITH CONTRAST TECHNIQUE: Multidetector CT imaging of the abdomen and pelvis was performed using the standard protocol following bolus administration of intravenous contrast. CONTRAST:  135mL ISOVUE-300 IOPAMIDOL (ISOVUE-300) INJECTION 61% COMPARISON:  04/05/2016 radiograph FINDINGS: Lower chest: Pacer leads observed. There is mild airway thickening in both lower lobes. Mild dependent subsegmental atelectasis. Hepatobiliary: Unremarkable Pancreas: Unremarkable Spleen:  Calcification in the spleen likely from old granulomatous disease. Adrenals/Urinary Tract: Unremarkable Stomach/Bowel: Sigmoid colon diverticulosis with mild adjacent inflammatory stranding shown on images 71 through 83 of series 4 favoring low-grade acute diverticulitis. No extraluminal gas or abscess. Appendix normal. There is scattered air-fluid levels in nondilated small bowel.  Diverticulum of the transverse duodenum noted. Vascular/Lymphatic: Aortoiliac atherosclerotic vascular disease. Infrarenal abdominal aortic aneurysm, 3.0 cm diameter on image 48/3, with a small amount of mural thrombus. No adenopathy. Reproductive: Unremarkable Other: Scattered upper normal size lymph nodes in the left upper quadrant small bowel mesentery. Musculoskeletal: Moderate to prominent axial loss of articular space in both hips. 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 IMPRESSION: 1. Mild active diverticulitis involving the sigmoid colon. No extraluminal gas or abscess. 2. Scattered air-fluid levels in nondilated loops of small bowel could be from mild enteritis or ileus. 3. 3 cm infrarenal abdominal aortic aneurysm. Recommend followup by ultrasound in 3 years. This recommendation follows ACR consensus guidelines: White Paper of the ACR Incidental Findings Committee II on Vascular Findings. J Am Coll Radiol 2013; [id number] 4. Airway thickening is present, suggesting bronchitis or reactive airways disease. 5. Axial thinning of articular cartilage in both hips. 6. Mild foraminal impingement at multiple levels in the lumbar spine primarily from congenitally short pedicles. Electronically Signed   By: Van Clines M.D.   On: 12/19/2016 09:02        Scheduled Meds: . aspirin  325 mg Oral Daily  . atorvastatin  40 mg Oral q1800  . cholecalciferol  2,000 Units Oral BH-q7a  . ciprofloxacin  400 mg Intravenous Q12H  . enoxaparin (LOVENOX) injection  40 mg Subcutaneous Q24H  . fluticasone furoate-vilanterol  1 puff Inhalation Daily  . insulin aspart  0-9 Units Subcutaneous TID WC  . ipratropium-albuterol  3 mL Nebulization BID  . lisinopril  40 mg Oral Daily  . loratadine  10 mg Oral Daily  . metoprolol tartrate  25 mg Oral Daily  . metroNIDAZOLE  500 mg Oral Q8H  . pantoprazole  40 mg Oral BH-q7a  . sertraline   100 mg Oral Daily  . sodium chloride flush  3 mL Intravenous Q12H  . tamsulosin  0.8 mg Oral QHS   Continuous Infusions: . 0.9 % NaCl with KCl 20 mEq / L 75 mL/hr at 12/19/16 1900     LOS: 1 day    Time spent: [place], Clare, MD [facility] Pager [phone]  If 7PM-7AM, please contact night-coverage [email or web address] Password [facility] 12/20/2016, 7:43 AM

## 2016-12-20 NOTE — Progress Notes (Signed)
Troponin I ordered every 6 hours times three. Drawn only once, lab contacted, they are not able to see order. MD contacted, RN instructed to collect troponin I once if pt denied chest pain.

## 2016-12-20 NOTE — Progress Notes (Signed)
Subjective: No further episodes  I had a long conversation with him and his wife. The episode this sounds most like seizure occurred last April, however after arrival to the ER he was found to be hypotensive in the [id number] systolic. The duration of his stiffness at that time was long, however lasting couple of minutes. The episodes do sound like they could be possible seizures, given that the description is blank stare and he has some sleepiness afterwards.  His description that he often feels better after he lies down and that he gets the sensation when he stands up too fast are suggestive that this could be presyncope rather than seizure. His wife does point out though, that he does have these while lying down and occasionally as well.  Exam: Vitals:   12/20/16 0441 12/20/16 1607  BP: 133/84 115/74  Pulse: 64 64  Resp: 19 17  Temp: 98.4 F (36.9 C) 97.4 F (36.3 C)   Gen: In bed, NAD Resp: non-labored breathing, no acute distress Abd: soft, nt  Neuro: MS: Awake, alert, interactive and appropriate CN: A structural movements intact,? Mild corrective saccade with cover-uncover but this is very subtle if it is present Motor: Full strength throughout Sensory: Decreased in the right arm and leg to light touch  Pertinent Labs: BMP-unremarkable  Impression: 61 year old male with more frequent spells in the setting of abdominal pain and likely decreased by mouth intake. My suspicion at this time, is that these represent hypoperfusion episodes rather than partial seizures.   Recommendations: 1) orthostatic vital signs 2) neurology will follow  Roland Rack, MD [facility] [phone]  If 7pm- 7am, please page neurology on call as listed in [place].

## 2016-12-20 NOTE — Procedures (Signed)
History: 61 yo M with transient episodes  Sedation: None  Technique: This is a 21 channel routine scalp EEG performed at the bedside with bipolar and monopolar montages arranged in accordance to the international 10/20 system of electrode placement. One channel was dedicated to EKG recording.    Background: The background consists of intermixed alpha and beta activities. There is a well defined posterior dominant rhythm of 8-9 Hz that attenuates with eye opening. Sleep is recorded with normal appearing structures.   Photic stimulation: Physiologic driving is not performed  EEG Abnormalities: none  Clinical Interpretation: This normal EEG is recorded in the waking state. There was no seizure or seizure predisposition recorded on this study. Please note that a normal EEG does not preclude the possibility of epilepsy.   Roland Rack, MD [facility] [phone]  If 7pm- 7am, please page neurology on call as listed in [place].

## 2016-12-21 DIAGNOSIS — I10 Essential (primary) hypertension: Secondary | ICD-10-CM

## 2016-12-21 LAB — GLUCOSE, CAPILLARY
GLUCOSE-CAPILLARY: 159 mg/dL — AB (ref 65–99)
GLUCOSE-CAPILLARY: 119 mg/dL — AB (ref 65–99)
Glucose-Capillary: 114 mg/dL — ABNORMAL HIGH (ref 65–99)
Glucose-Capillary: 163 mg/dL — ABNORMAL HIGH (ref 65–99)

## 2016-12-21 LAB — HEMOGLOBIN A1C
HEMOGLOBIN A1C: 6.3 % — AB (ref 4.8–5.6)
MEAN PLASMA GLUCOSE: 134 mg/dL

## 2016-12-21 MED ORDER — CIPROFLOXACIN HCL 500 MG PO TABS
500.0000 mg | ORAL_TABLET | Freq: Two times a day (BID) | ORAL | Status: DC
  Administered 2016-12-21 – 2016-12-22 (×2): 500 mg via ORAL
  Filled 2016-12-21 (×2): qty 1

## 2016-12-21 MED ORDER — METRONIDAZOLE 500 MG PO TABS
500.0000 mg | ORAL_TABLET | Freq: Three times a day (TID) | ORAL | Status: DC
  Administered 2016-12-21 – 2016-12-22 (×4): 500 mg via ORAL
  Filled 2016-12-21 (×4): qty 1

## 2016-12-21 NOTE — Progress Notes (Signed)
PROGRESS NOTE    Wesley [name]  [id number] DOB: 1956-03-04 DOA: 12/19/2016 PCP: Minerva Ends, MD    Brief Narrative:   61 y/o ? COPD CVA Copd/smoker FEV1 at only 50% of predicted and a good improvement (relative change 18%) after bronchodilators. Dual chamber St Jude assurity PPM 11/2014 for syncopal event placed in [place] s/p stent 07/11/15 Htn HLD BPH + erectile dysfunction Known Colonic polyps/int hemorrhoids  12/2015 + diverticulosis on Colonoscopy -Dr. 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  Older CVA with bilateral cartodi stenosis  Known H/o essential tremor since 2012  On and off h/o syncope  Since 2007--felt orthostatic/vasovagal   Admitted with abd pain and subj fever chills Constant chest burning feeling More drowsy per wife x last 2 days prior to admit Found to have dysconjugate gaze which has been sub-acute over past 6 mo See above salient neurological hisotry  On admission cxr bullous emphysema cta head wnl and no new changes  Assessment & Plan:   Active Problems:   Cardiac pacemaker in situ   Essential hypertension   COPD mixed type ([place])   History of substance abuse   SSS (sick sinus syndrome) ([place])   Diabetes mellitus type 2, controlled ([place])   Acute diverticulitis   Dysconjugate gaze  Acute diverticulitis + ileus mild  on CT scan 1/5-continue IV Cipro Flagyl, white count not elevated Pain control Norco one to 2 every 4 when necessary-clear liquid diet grad as tol as is hungry. Enema? SSS, s/p dual chamber St. Jude pacemaker 11/2014-telemetry monitoring= MI status post stent-continue metoprolol 35 daily, lisinopril 40 daily--troponin are negative and chest burning probably GI without cardiac causes and therefore stopped checking troponin severe reflux already on Protonix 40day, add Pepcid 40mg  2 times a day Mild hypokalemia-d/c saline 75 cc/HR + KCl 20 12/21/16 Repeat labs  a.m. BPH + erectile dysfunction-continue Flomax 0.4 Diabetes mellitus relatively new onset 10/17-CBGs ranging 91-114 range. 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 Continue Prelone, DuoNeb, Claritin   DVT prophylaxis: Lovenox Code Status: Full Family Communication: none at bedside Disposition Plan: Possible discharge 1 8 2018   Consultants:   None yet  Procedures:   none  Antimicrobials:   Cipro 1/5  flagyll 1/5   Cipro Flagyl orally 1/7   Subjective:  Looks and feels well Eating and drinking without pain or discomfort no vomiting Had an enema yesterday and cleaned out No fever no chills   Objective: Vitals:   12/20/16 2054 12/20/16 2109 12/21/16 0459 12/21/16 1000  BP: 127/79  (!) 148/96   Pulse: 71  60   Resp:   (!) 21   Temp:   98.1 F (36.7 C)   TempSrc:  SpO2: 95% 95% 98% 92%    Intake/Output Summary (Last 24 hours) at 12/21/16 1145 Last data filed at 12/21/16 1029  Gross per 24 hour  Intake              923 ml  Output             1400 ml  Net             -477 ml   There were no vitals filed for this visit.  Examination:  General exam: Appears calm , Alert and pleasant Respiratory system: Clear to auscultation. Respiratory effort normal. Cardiovascular system: S1 & S2 heard, RRR. No JVD, murmurs, rubs, gallops or clicks. No pedal  edema. Gastrointestinal system: Abdomen is nondistended, soft and nontender. No organomegaly, Central nervous system: Alert and oriented. No focal neurological deficits. Extremities: Symmetric 5 x 5 power. Skin: No rashes, lesions or ulcers Psychiatry: Judgement and insight appear normal. Mood & affect appropriate.     Data Reviewed: I have personally reviewed following labs and imaging studies  CBC:  Recent Labs Lab 12/19/16 0727 12/19/16 0738 12/20/16 0714  WBC 6.7  --  3.9*  HGB 15.1 16.3 13.1  HCT 45.3 48.0 40.4  MCV 80.9  --  82.6  PLT 162  --  [id number]*   Basic Metabolic Panel:  Recent Labs Lab 12/19/16 0727 12/19/16 0738 12/20/16 0714  NA 135 135 136  K 4.0 3.9 4.0  CL 102 101 103  CO2 23  --  24  GLUCOSE 116* 116* 110*  BUN 19 18 8   CREATININE 0.88 0.70 0.76  CALCIUM 8.9  --  8.8*   GFR: CrCl cannot be calculated (Unknown ideal weight.). Liver Function Tests:  Recent Labs Lab 12/19/16 0727  AST 19  ALT 33  ALKPHOS 101  BILITOT 0.7  PROT 7.3  ALBUMIN 4.2    Recent Labs Lab 12/19/16 0727  LIPASE 19   No results for input(s): AMMONIA in the last 168 hours. Coagulation Profile: No results for input(s): INR, PROTIME in the last 168 hours. Cardiac Enzymes:  Recent Labs Lab 12/19/16 1443 12/20/16 0802  TROPONINI <0.03 <0.03   BNP (last 3 results) No results for input(s): PROBNP in the last 8760 hours. HbA1C: No results for input(s): HGBA1C in the last 72 hours. CBG:  Recent Labs Lab 12/20/16 0757 12/20/16 1246 12/20/16 1732 12/20/16 2050 12/21/16 0814  GLUCAP 113* 147* 116* 91 114*   Lipid Profile: No results for input(s): CHOL, HDL, LDLCALC, TRIG, CHOLHDL, LDLDIRECT in the last 72 hours. Thyroid Function Tests: No results for input(s): TSH, T4TOTAL, FREET4, T3FREE, THYROIDAB in the last 72 hours. Anemia Panel: No results for input(s): VITAMINB12, FOLATE, FERRITIN, TIBC, IRON, RETICCTPCT in the last 72 hours. Sepsis  Labs:  Recent Labs Lab 12/19/16 0747 12/19/16 1111  LATICACIDVEN 1.70 1.01    No results found for this or any previous visit (from the past 240 hour(s)).       Radiology Studies: Ct Angio Head W Or Wo Contrast  Result Date: 12/19/2016 CLINICAL DATA:  61 year old male with disc conjugate gaze, abnormal vision exam. Initial encounter. EXAM: CT ANGIOGRAPHY HEAD AND NECK TECHNIQUE: Multidetector CT imaging of the head and neck was performed using the standard protocol during bolus administration of intravenous contrast. Multiplanar CT image reconstructions and MIPs were obtained to evaluate the vascular anatomy. Carotid stenosis measurements (when applicable) are obtained utilizing NASCET criteria, using the distal internal carotid diameter as the denominator. CONTRAST:  100 mL Isovue  370 COMPARISON:  Head CT without contrast 0825 hours today. CTA head and neck 08/29/2016 and 04/05/2016. FINDINGS: CTA NECK Skeleton: Chronic straightening of cervical lordosis. Stable visualized osseous structures. Stable paranasal sinuses and mastoids, mild mucosal thickening. 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 No superior mediastinal lymphadenopathy. Other neck: Subcentimeter posterior right thyroid lobe nodule is stable and does not meet consensus criteria for ultrasound follow-up. Larynx, pharynx, parapharyngeal spaces, retropharyngeal space, sublingual space, submandibular glands and parotid glands are within normal limits. No cervical lymphadenopathy. Aortic arch: 3 vessel arch configuration. Stable mild arch atherosclerosis, mostly at the great vessel origins. Right carotid system: Stable, no stenosis. Left carotid system: No left CCA origin stenosis despite calcified plaque. Otherwise negative, no stenosis. Vertebral arteries: No proximal right subclavian artery stenosis. The right vertebral artery origin appears normal. Stable and negative cervical right vertebral artery. No proximal  left subclavian artery stenosis despite calcified plaque. There is chronic calcified plaque at the left vertebral artery origin which appears stable. There is mild to moderate left vertebral origin stenosis. The proximal left vertebral is mildly obscured by paravertebral venous contrast. But otherwise the left vertebral is normal to the skullbase. CTA HEAD Posterior circulation: Mildly dominant distal left vertebral artery. No distal vertebral stenosis. Normal left PICA origin. Normal vertebrobasilar junction (small fenestration common normal variant). No basilar artery stenosis. AICA, SCA, and PCA origins are normal. Posterior communicating arteries are diminutive or absent. Bilateral PCA branches appear normal. Anterior circulation: Both ICA siphons remain patent. Bilateral siphon calcified plaque appears stable. ICA siphon detail is superior today compared to 04/05/2016. No hemodynamically significant siphon stenosis is identified despite the calcified plaque. Normal ophthalmic artery origins. Normal carotid termini, MCA and ACA origins. Bilateral ACA branches are normal. Left MCA M1 segment, trifurcation and left MCA branches appear normal. Right MCA M1 segment, bifurcation, and right MCA branches appear normal. Venous sinuses: Patent. Anatomic variants: Mildly dominant left vertebral artery. Delayed phase: No abnormal enhancement identified. Stable CT appearance of the brain. Review of the MIP images confirms the above findings IMPRESSION: 1. Stable neck CTA since April 2017. No emergent large vessel occlusion. 2. No significant carotid atherosclerosis or stenosis in the neck. Extensive Bilateral ICA siphon calcified plaque but no significant stenosis identified. 3. Calcified plaque at the left vertebral artery origin with mild to moderate stenosis. Otherwise negative posterior circulation. 4.  Stable CT appearance of the brain. 5. Bullous emphysema. Electronically Signed   By: Genevie Ann M.D.   On: 12/19/2016  12:15   Dg Chest 2 View  Result Date: 12/19/2016 CLINICAL DATA:  Chest pain EXAM: CHEST  2 VIEW COMPARISON:  09/16/2016 FINDINGS: Heart is normal in size. There is aortic atherosclerosis. No aortic aneurysm. Left-sided pacemaker apparatus is noted with leads projecting over right atrium and coronary sinus. Scarring in the right mid lung. Bullous emphysema in the right upper lobe as before. No suspicious osseous abnormality. IMPRESSION: Stable bullous emphysematous disease involving the right lung apex with scarring in the right mid lung. No acute cardiopulmonary disease. Electronically Signed   By: Ashley Royalty M.D.   On: 12/19/2016 23:13   Ct Angio Neck W And/or Wo Contrast  Result Date: 12/19/2016 CLINICAL DATA:  61 year old male with disc conjugate gaze, abnormal vision exam. Initial encounter. EXAM: CT ANGIOGRAPHY HEAD AND NECK TECHNIQUE: Multidetector CT imaging of the head and neck was performed using the standard protocol during bolus administration of intravenous contrast. Multiplanar CT image reconstructions and MIPs were obtained to  evaluate the vascular anatomy. Carotid stenosis measurements (when applicable) are obtained utilizing NASCET criteria, using the distal internal carotid diameter as the denominator. CONTRAST:  100 mL Isovue 370 COMPARISON:  Head CT without contrast 0825 hours today. CTA head and neck 08/29/2016 and 04/05/2016. FINDINGS: CTA NECK Skeleton: Chronic straightening of cervical lordosis. Stable visualized osseous structures. Stable paranasal sinuses and mastoids, mild mucosal thickening. 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 No superior mediastinal lymphadenopathy. Other neck: Subcentimeter posterior right thyroid lobe nodule is stable and does not meet consensus criteria for ultrasound follow-up. Larynx, pharynx, parapharyngeal spaces, retropharyngeal space, sublingual space, submandibular glands and parotid glands are within normal limits. No cervical  lymphadenopathy. Aortic arch: 3 vessel arch configuration. Stable mild arch atherosclerosis, mostly at the great vessel origins. Right carotid system: Stable, no stenosis. Left carotid system: No left CCA origin stenosis despite calcified plaque. Otherwise negative, no stenosis. Vertebral arteries: No proximal right subclavian artery stenosis. The right vertebral artery origin appears normal. Stable and negative cervical right vertebral artery. No proximal left subclavian artery stenosis despite calcified plaque. There is chronic calcified plaque at the left vertebral artery origin which appears stable. There is mild to moderate left vertebral origin stenosis. The proximal left vertebral is mildly obscured by paravertebral venous contrast. But otherwise the left vertebral is normal to the skullbase. CTA HEAD Posterior circulation: Mildly dominant distal left vertebral artery. No distal vertebral stenosis. Normal left PICA origin. Normal vertebrobasilar junction (small fenestration common normal variant). No basilar artery stenosis. AICA, SCA, and PCA origins are normal. Posterior communicating arteries are diminutive or absent. Bilateral PCA branches appear normal. Anterior circulation: Both ICA siphons remain patent. Bilateral siphon calcified plaque appears stable. ICA siphon detail is superior today compared to 04/05/2016. No hemodynamically significant siphon stenosis is identified despite the calcified plaque. Normal ophthalmic artery origins. Normal carotid termini, MCA and ACA origins. Bilateral ACA branches are normal. Left MCA M1 segment, trifurcation and left MCA branches appear normal. Right MCA M1 segment, bifurcation, and right MCA branches appear normal. Venous sinuses: Patent. Anatomic variants: Mildly dominant left vertebral artery. Delayed phase: No abnormal enhancement identified. Stable CT appearance of the brain. Review of the MIP images confirms the above findings IMPRESSION: 1. Stable neck CTA  since April 2017. No emergent large vessel occlusion. 2. No significant carotid atherosclerosis or stenosis in the neck. Extensive Bilateral ICA siphon calcified plaque but no significant stenosis identified. 3. Calcified plaque at the left vertebral artery origin with mild to moderate stenosis. Otherwise negative posterior circulation. 4.  Stable CT appearance of the brain. 5. Bullous emphysema. Electronically Signed   By: Genevie Ann M.D.   On: 12/19/2016 12:15        Scheduled Meds: . aspirin  325 mg Oral Daily  . atorvastatin  40 mg Oral q1800  . cholecalciferol  2,000 Units Oral BH-q7a  . ciprofloxacin  400 mg Intravenous Q12H  . enoxaparin (LOVENOX) injection  40 mg Subcutaneous Q24H  . famotidine  40 mg Oral BID  . fluticasone furoate-vilanterol  1 puff Inhalation Daily  . insulin aspart  0-9 Units Subcutaneous TID WC  . ipratropium-albuterol  3 mL Nebulization BID  . lisinopril  40 mg Oral Daily  . loratadine  10 mg Oral Daily  . metoprolol tartrate  25 mg Oral Daily  . metroNIDAZOLE  500 mg Oral Q8H  . pantoprazole  40 mg Oral BH-q7a  . sertraline  100 mg Oral Daily  . sodium chloride flush  3 mL Intravenous Q12H  . tamsulosin  0.8 mg Oral QHS   Continuous Infusions:    LOS: 2 days    Time spent: 100    Nita Sells, MD [facility] Pager [phone]  If 7PM-7AM, please contact night-coverage [email or web address] Password [email or web address] 12/21/2016, 11:45 AM

## 2016-12-21 NOTE — Progress Notes (Signed)
Subjective: Patient has no complaints. Resting comfortably.   Exam: Vitals:   12/20/16 2054 12/21/16 0459  BP: 127/79 (!) 148/96  Pulse: 71 60  Resp:  (!) 21  Temp:  98.1 F (36.7 C)    Gen: In bed, NAD MS: Alert and oriented CN: 2-12 intact Motor: MAEW Sensory: intact   Pertinent Labs/Diagnostics: EEG snowed no epileptiform activity Orthostatic were normal.   Etta Quill PA-C [facility] [phone]  Impression: 61 year old male with more frequent spells in the setting of abdominal pain and likely decreased by mouth intake. My suspicion at this time, is that these represent hypoperfusion episodes rather than partial seizures. I discussed with the patient that I think that the next step in diagnosis would be a home automatic BP cuff that could be used during a spell. If the BP is not low, then may need to consider empiric AED trial.    Recommendations: 1) Home BP cuff 2) No further inpatient recommendations at this time, please call with further questions or concerns.    Roland Rack, MD [facility] [phone]  If 7pm- 7am, please page neurology on call as listed in [place].  12/21/2016, 8:57 AM

## 2016-12-22 ENCOUNTER — Ambulatory Visit (HOSPITAL_BASED_OUTPATIENT_CLINIC_OR_DEPARTMENT_OTHER): Payer: Medicaid Other | Attending: Pulmonary Disease | Admitting: Pulmonary Disease

## 2016-12-22 DIAGNOSIS — J449 Chronic obstructive pulmonary disease, unspecified: Secondary | ICD-10-CM | POA: Insufficient documentation

## 2016-12-22 DIAGNOSIS — G4733 Obstructive sleep apnea (adult) (pediatric): Secondary | ICD-10-CM | POA: Insufficient documentation

## 2016-12-22 LAB — CBC
HEMATOCRIT: 41.5 % (ref 39.0–52.0)
HEMOGLOBIN: 13.6 g/dL (ref 13.0–17.0)
MCH: 27 pg (ref 26.0–34.0)
MCHC: 32.8 g/dL (ref 30.0–36.0)
MCV: 82.3 fL (ref 78.0–100.0)
Platelets: 170 10*3/uL (ref 150–400)
RBC: 5.04 MIL/uL (ref 4.22–5.81)
RDW: 14.5 % (ref 11.5–15.5)
WBC: 4.8 10*3/uL (ref 4.0–10.5)

## 2016-12-22 LAB — GLUCOSE, CAPILLARY
Glucose-Capillary: 112 mg/dL — ABNORMAL HIGH (ref 65–99)
Glucose-Capillary: 184 mg/dL — ABNORMAL HIGH (ref 65–99)

## 2016-12-22 MED ORDER — GUAIFENESIN 100 MG/5ML PO SOLN: 10.0000 mL | ORAL | 0 refills | Status: DC | PRN

## 2016-12-22 MED ORDER — METRONIDAZOLE 500 MG PO TABS: 500.0000 mg | ORAL_TABLET | Freq: Three times a day (TID) | ORAL | 0 refills | Status: DC

## 2016-12-22 MED ORDER — CIPROFLOXACIN HCL 500 MG PO TABS: 500.0000 mg | ORAL_TABLET | Freq: Two times a day (BID) | ORAL | 0 refills | Status: DC

## 2016-12-22 MED FILL — ROBAFEN 100 MG/5 ML SYRUP: 100 | 12 days supply | Qty: 473 | Fill #0

## 2016-12-22 MED FILL — ?METRONIDAZOLE 500 MG TABLE: 500 | 6 days supply | Qty: 18 | Fill #0

## 2016-12-22 MED FILL — ?CIPROFLOXACIN HCL 500MG TA: 500 | 6 days supply | Qty: 12 | Fill #0

## 2016-12-22 NOTE — Discharge Summary (Signed)
Physician Discharge Summary  Wesley [name] [id number] DOB: Jun 17, 1956 DOA: 12/19/2016  PCP: Minerva Ends, MD  Admit date: 12/19/2016 Discharge date: 12/22/2016  Time spent: 40 minutes  Recommendations for Outpatient Follow-up:  1. Patient needs to complete full course of ciprofloxacin and Flagyl for diverticulitis 2. He is scheduled for sleep study as well as neurology follow-up 3. He has been recommended to get blood sugar And when he feels presyncopal then he should check his blood pressure it was not felt that he had seizure disorder this admission  Discharge Diagnoses:  Active Problems:   Cardiac pacemaker in situ   Essential hypertension   COPD mixed type ([place])   History of substance abuse   SSS (sick sinus syndrome) ([place])   Diabetes mellitus type 2, controlled ([place])   Acute diverticulitis   Dysconjugate gaze   Discharge Condition: Improved  Diet recommendation: Heart healthy  There were no vitals filed for this visit.  History of present illness:  61 y/o ? COPD CVA Copd/smoker FEV1 at only 50% of predicted and a good improvement (relative change 18%) after bronchodilators. Dual chamber St Jude assurity PPM 11/2014 for syncopal event placed in [place] s/p stent 07/11/15 Htn HLD BPH + erectile dysfunction Known Colonic polyps/int hemorrhoids  12/2015 + diverticulosis on Colonoscopy -Dr. 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             Older CVA with bilateral cartodi stenosis             Known H/o essential tremor since 2012             On and off h/o syncope  Since 2007--felt orthostatic/vasovagal  He was admitted initially with neurological complaints and found to be drowsy and staring into space-neurology was consulted on admission Eventually found that feeling poorly abdominal pain nausea and found to have an acute diverticulitis please see below    Acute diverticulitis + ileus mild  on CT  scan was transitioned from IV by mouth 12/21/16 Cipro Flagyl, white count not elevated Pain control in hospital was with Norco one to 2 every 4 patient -tolerating regular diet by 12/21/2016 SSS, s/p dual chamber St. Jude pacemaker 11/2014-telemetry monitoring= MI status post stent-continue metoprolol 35 daily, lisinopril 40 daily--troponin are negative and chest burning probably GI without cardiac causes and therefore stopped checking troponin severe reflux already on Protonix 40day, add Pepcid 55m 2 times a day Mild hypokalemia-d/c saline 75 cc/HR + KCl 20 12/21/16 BPH + erectile dysfunction-continue Flomax 0.4 Diabetes mellitus relatively new onset-HbA1c this admission 6.3 10/17-CBGs ranging 91-114 range. 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 Continue Prelone, DuoNeb, Claritin  Consultations:  Neurology  Discharge Exam: Vitals:   12/22/16 0414 12/22/16 0850  BP: 112/74 114/72  Pulse: 68 60  Resp: 18   Temp: 98.3 F (36.8 C)     General: EOMI NCAT, no focal deficit alert Cardiovascular: S1-S2 no murmur rub or gallop Respiratory: Clinically clear no added sound Abdomen soft nontender nondistended no rebound, no discomfort  Discharge Instructions   Discharge Instructions    Diet - low sodium heart healthy    Complete by:  As  directed    Discharge instructions    Complete by:  As directed    Complete the course of Flagyl and ciprofloxacin for your diverticulitis As mentioned to you by neurology, you will need a blood pressure To make sure that you do not have any low blood pressures or fainting spells and to confirm this. I would follow up with neurology as an outpatient with your regular neurologist Recommend getting labs at her primary physician's office in about a week or so There is no specific diet changes that you have to make, this condition is not really affect it by what you eat per se   Increase activity slowly    Complete by:  As directed      Current Discharge Medication List    START taking these medications   Details  ciprofloxacin (CIPRO) 500 MG tablet Take 1 tablet (500 mg total) by mouth 2 (two) times daily. Qty: 12 tablet, Refills: 0    guaiFENesin (ROBITUSSIN) 100 MG/5ML SOLN Take 10 mLs (200 mg total) by mouth every 4 (four) hours as needed for cough or to loosen phlegm. Qty: 1200 mL, Refills: 0    metroNIDAZOLE (FLAGYL) 500 MG tablet Take 1 tablet (500 mg total) by mouth every 8 (eight) hours. Qty: 18 tablet, Refills: 0      CONTINUE these medications which have NOT CHANGED   Details  albuterol (PROVENTIL HFA;VENTOLIN HFA) 108 (90 Base) MCG/ACT inhaler Inhale 2 puffs into the lungs every 6 (six) hours as needed for wheezing or shortness of breath. Qty: 1 Inhaler, Refills: 2    aspirin 325 MG tablet Take 325 mg by mouth daily.    atorvastatin (LIPITOR) 40 MG tablet Take 1 tablet (40 mg total) by mouth daily at 6 PM. Qty: 30 tablet, Refills: 2   Associated Diagnoses: Hyperlipidemia    benzonatate (TESSALON) 100 MG capsule TAKE 1 CAPSULE BY MOUTH 3 TIMES DAILY AS NEEDED FOR COUGH. Qty: 30 capsule, Refills: 0   Associated Diagnoses: COPD mixed type ([place])    Blood Glucose Monitoring Suppl (ACCU-CHEK AVIVA PLUS) w/Device KIT 1 Device by Does not apply route 4 (four) times  daily. Qty: 1 kit, Refills: 0    cetirizine (ZYRTEC) 10 MG tablet Take 1 tablet (10 mg total) by mouth daily. Qty: 30 tablet, Refills: 11    Cholecalciferol (VITAMIN D3) 2000 UNITS TABS Take 2,000 Units by mouth daily. Qty: 30 tablet, Refills: 11   Associated Diagnoses: Vitamin D insufficiency    !! Elastic Bandages & Supports (WRIST SPLINT/ELASTIC LEFT LG) MISC 1 each by Does not apply route daily. Qty: 1 each, Refills: 0   Associated Diagnoses: Pain in both wrists    !! Elastic Bandages & Supports (WRIST SPLINT/ELASTIC RIGHT LG) MISC 1 each by Does not apply route at bedtime. Qty: 1 each, Refills: 0   Associated Diagnoses: Pain in both wrists    fluticasone furoate-vilanterol (BREO ELLIPTA) 100-25 MCG/INH AEPB Inhale 1 puff into the lungs daily. Qty: 30 each, Refills: 5  glucose blood (ACCU-CHEK AVIVA) test strip Use as instructed Qty: 100 each, Refills: 12    lisinopril (PRINIVIL,ZESTRIL) 40 MG tablet Take 40 mg by mouth daily.  Refills: 5    meloxicam (MOBIC) 15 MG tablet Take 1 tablet (15 mg total) by mouth daily. Qty: 30 tablet, Refills: 2   Associated Diagnoses: Left knee pain    metFORMIN (GLUCOPHAGE) 500 MG tablet Take 1 tablet (500 mg total) by mouth 2 (two) times daily with a meal. Qty: 60 tablet, Refills: 1    metoprolol tartrate (LOPRESSOR) 25 MG tablet Take 25 mg by mouth daily.  Refills: 3    pantoprazole (PROTONIX) 40 MG tablet Take 1 tablet (40 mg total) by mouth daily. Qty: 30 tablet, Refills: 0    sertraline (ZOLOFT) 100 MG tablet Take 100 mg by mouth daily.    tamsulosin (FLOMAX) 0.4 MG CAPS capsule 0.4 mg by mouth nightly after supper for two weeks, then 0.8 mg nightly Qty: 60 capsule, Refills: 5    meclizine (ANTIVERT) 25 MG tablet Take 1 tablet (25 mg total) by mouth 2 (two) times daily as needed. Qty: 60 tablet, Refills: 0     !! - Potential duplicate medications found. Please discuss with provider.    STOP taking these medications      albuterol (PROVENTIL) (2.5 MG/3ML) 0.083% nebulizer solution      fluticasone (FLONASE) 50 MCG/ACT nasal spray      Lancets (ACCU-CHEK SOFT TOUCH) lancets        Allergies  Allergen Reactions  . Penicillins Swelling    Has patient had a PCN reaction causing immediate rash, facial/tongue/throat swelling, SOB or lightheadedness with hypotension: unknown Has patient had a PCN reaction causing severe rash involving mucus membranes or skin necrosis: unknown Has patient had a PCN reaction that required hospitalization : unknown Has patient had a PCN reaction occurring within the last 10 years: no If all of the above answers are "NO", then may proceed with Cephalosporin use.       The results of significant diagnostics from this hospitalization (including imaging, microbiology, ancillary and laboratory) are listed below for reference.    Significant Diagnostic Studies: Ct Angio Head W Or Wo Contrast  Result Date: 12/19/2016 CLINICAL DATA:  61 year old male with disc conjugate gaze, abnormal vision exam. Initial encounter. EXAM: CT ANGIOGRAPHY HEAD AND NECK TECHNIQUE: Multidetector CT imaging of the head and neck was performed using the standard protocol during bolus administration of intravenous contrast. Multiplanar CT image reconstructions and MIPs were obtained to evaluate the vascular anatomy. Carotid stenosis measurements (when applicable) are obtained utilizing NASCET criteria, using the distal internal carotid diameter as the denominator. CONTRAST:  100 mL Isovue 370 COMPARISON:  Head CT without contrast 0825 hours today. CTA head and neck 08/29/2016 and 04/05/2016. FINDINGS: CTA NECK Skeleton: Chronic straightening of cervical lordosis. Stable visualized osseous structures. Stable paranasal sinuses and mastoids, mild mucosal thickening. 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 No superior mediastinal lymphadenopathy. Other neck: Subcentimeter posterior right thyroid  lobe nodule is stable and does not meet consensus criteria for ultrasound follow-up. Larynx, pharynx, parapharyngeal spaces, retropharyngeal space, sublingual space, submandibular glands and parotid glands are within normal limits. No cervical lymphadenopathy. Aortic arch: 3 vessel arch configuration. Stable mild arch atherosclerosis, mostly at the great vessel origins. Right carotid system: Stable, no stenosis. Left carotid system: No left CCA origin stenosis despite calcified plaque. Otherwise negative, no stenosis. Vertebral arteries: No proximal right subclavian artery stenosis. The right vertebral  artery origin appears normal. Stable and negative cervical right vertebral artery. No proximal left subclavian artery stenosis despite calcified plaque. There is chronic calcified plaque at the left vertebral artery origin which appears stable. There is mild to moderate left vertebral origin stenosis. The proximal left vertebral is mildly obscured by paravertebral venous contrast. But otherwise the left vertebral is normal to the skullbase. CTA HEAD Posterior circulation: Mildly dominant distal left vertebral artery. No distal vertebral stenosis. Normal left PICA origin. Normal vertebrobasilar junction (small fenestration common normal variant). No basilar artery stenosis. AICA, SCA, and PCA origins are normal. Posterior communicating arteries are diminutive or absent. Bilateral PCA branches appear normal. Anterior circulation: Both ICA siphons remain patent. Bilateral siphon calcified plaque appears stable. ICA siphon detail is superior today compared to 04/05/2016. No hemodynamically significant siphon stenosis is identified despite the calcified plaque. Normal ophthalmic artery origins. Normal carotid termini, MCA and ACA origins. Bilateral ACA branches are normal. Left MCA M1 segment, trifurcation and left MCA branches appear normal. Right MCA M1 segment, bifurcation, and right MCA branches appear normal. Venous  sinuses: Patent. Anatomic variants: Mildly dominant left vertebral artery. Delayed phase: No abnormal enhancement identified. Stable CT appearance of the brain. Review of the MIP images confirms the above findings IMPRESSION: 1. Stable neck CTA since April 2017. No emergent large vessel occlusion. 2. No significant carotid atherosclerosis or stenosis in the neck. Extensive Bilateral ICA siphon calcified plaque but no significant stenosis identified. 3. Calcified plaque at the left vertebral artery origin with mild to moderate stenosis. Otherwise negative posterior circulation. 4.  Stable CT appearance of the brain. 5. Bullous emphysema. Electronically Signed   By: Genevie Ann M.D.   On: 12/19/2016 12:15   Dg Chest 2 View  Result Date: 12/19/2016 CLINICAL DATA:  Chest pain EXAM: CHEST  2 VIEW COMPARISON:  09/16/2016 FINDINGS: Heart is normal in size. There is aortic atherosclerosis. No aortic aneurysm. Left-sided pacemaker apparatus is noted with leads projecting over right atrium and coronary sinus. Scarring in the right mid lung. Bullous emphysema in the right upper lobe as before. No suspicious osseous abnormality. IMPRESSION: Stable bullous emphysematous disease involving the right lung apex with scarring in the right mid lung. No acute cardiopulmonary disease. Electronically Signed   By: Ashley Royalty M.D.   On: 12/19/2016 23:13   Ct Head Wo Contrast  Result Date: 12/19/2016 CLINICAL DATA:  Staring for past 4 days. EXAM: CT HEAD WITHOUT CONTRAST TECHNIQUE: Contiguous axial images were obtained from the base of the skull through the vertex without intravenous contrast. COMPARISON:  08/29/2016 FINDINGS: Brain: No evidence of acute infarction, hemorrhage, hydrocephalus, extra-axial collection or mass lesion/mass effect. Chronic microvascular disease that is prominent for age. Stable pattern of microvascular ischemic gliosis. Vascular: Atherosclerotic calcification. Skull: Negative for fracture or destructive  process. Sinuses/Orbits: Bilateral cataract resection. Other: Negative IMPRESSION: 1. No acute finding. 2. Chronic microvascular disease. Electronically Signed   By: Monte Fantasia M.D.   On: 12/19/2016 08:57   Ct Angio Neck W And/or Wo Contrast  Result Date: 12/19/2016 CLINICAL DATA:  61 year old male with disc conjugate gaze, abnormal vision exam. Initial encounter. EXAM: CT ANGIOGRAPHY HEAD AND NECK TECHNIQUE: Multidetector CT imaging of the head and neck was performed using the standard protocol during bolus administration of intravenous contrast. Multiplanar CT image reconstructions and MIPs were obtained to evaluate the vascular anatomy. Carotid stenosis measurements (when applicable) are obtained utilizing NASCET criteria, using the distal internal carotid diameter as the denominator. CONTRAST:  100 mL  Isovue 370 COMPARISON:  Head CT without contrast 0825 hours today. CTA head and neck 08/29/2016 and 04/05/2016. FINDINGS: CTA NECK Skeleton: Chronic straightening of cervical lordosis. Stable visualized osseous structures. Stable paranasal sinuses and mastoids, mild mucosal thickening. 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 No superior mediastinal lymphadenopathy. Other neck: Subcentimeter posterior right thyroid lobe nodule is stable and does not meet consensus criteria for ultrasound follow-up. Larynx, pharynx, parapharyngeal spaces, retropharyngeal space, sublingual space, submandibular glands and parotid glands are within normal limits. No cervical lymphadenopathy. Aortic arch: 3 vessel arch configuration. Stable mild arch atherosclerosis, mostly at the great vessel origins. Right carotid system: Stable, no stenosis. Left carotid system: No left CCA origin stenosis despite calcified plaque. Otherwise negative, no stenosis. Vertebral arteries: No proximal right subclavian artery stenosis. The right vertebral artery origin appears normal. Stable and negative cervical right  vertebral artery. No proximal left subclavian artery stenosis despite calcified plaque. There is chronic calcified plaque at the left vertebral artery origin which appears stable. There is mild to moderate left vertebral origin stenosis. The proximal left vertebral is mildly obscured by paravertebral venous contrast. But otherwise the left vertebral is normal to the skullbase. CTA HEAD Posterior circulation: Mildly dominant distal left vertebral artery. No distal vertebral stenosis. Normal left PICA origin. Normal vertebrobasilar junction (small fenestration common normal variant). No basilar artery stenosis. AICA, SCA, and PCA origins are normal. Posterior communicating arteries are diminutive or absent. Bilateral PCA branches appear normal. Anterior circulation: Both ICA siphons remain patent. Bilateral siphon calcified plaque appears stable. ICA siphon detail is superior today compared to 04/05/2016. No hemodynamically significant siphon stenosis is identified despite the calcified plaque. Normal ophthalmic artery origins. Normal carotid termini, MCA and ACA origins. Bilateral ACA branches are normal. Left MCA M1 segment, trifurcation and left MCA branches appear normal. Right MCA M1 segment, bifurcation, and right MCA branches appear normal. Venous sinuses: Patent. Anatomic variants: Mildly dominant left vertebral artery. Delayed phase: No abnormal enhancement identified. Stable CT appearance of the brain. Review of the MIP images confirms the above findings IMPRESSION: 1. Stable neck CTA since April 2017. No emergent large vessel occlusion. 2. No significant carotid atherosclerosis or stenosis in the neck. Extensive Bilateral ICA siphon calcified plaque but no significant stenosis identified. 3. Calcified plaque at the left vertebral artery origin with mild to moderate stenosis. Otherwise negative posterior circulation. 4.  Stable CT appearance of the brain. 5. Bullous emphysema. Electronically Signed   By: Genevie Ann M.D.   On: 12/19/2016 12:15   Ct Abdomen Pelvis W Contrast  Result Date: 12/19/2016 CLINICAL DATA:  Lower abdominal pain. Nausea and vomiting with diarrhea since 10 p.m. on 12/18/2016. Headache and confusion. EXAM: CT ABDOMEN AND PELVIS WITH CONTRAST TECHNIQUE: Multidetector CT imaging of the abdomen and pelvis was performed using the standard protocol following bolus administration of intravenous contrast. CONTRAST:  124m ISOVUE-300 IOPAMIDOL (ISOVUE-300) INJECTION 61% COMPARISON:  04/05/2016 radiograph FINDINGS: Lower chest: Pacer leads observed. There is mild airway thickening in both lower lobes. Mild dependent subsegmental atelectasis. Hepatobiliary: Unremarkable Pancreas: Unremarkable Spleen: Calcification in the spleen likely from old granulomatous disease. Adrenals/Urinary Tract: Unremarkable Stomach/Bowel: Sigmoid colon diverticulosis with mild adjacent inflammatory stranding shown on images 71 through 83 of series 4 favoring low-grade acute diverticulitis. No extraluminal gas or abscess. Appendix normal. There is scattered air-fluid levels in nondilated small bowel. Diverticulum of the transverse duodenum noted. Vascular/Lymphatic: Aortoiliac atherosclerotic vascular disease. Infrarenal abdominal aortic aneurysm, 3.0 cm diameter on image 48/3, with  a small amount of mural thrombus. No adenopathy. Reproductive: Unremarkable Other: Scattered upper normal size lymph nodes in the left upper quadrant small bowel mesentery. Musculoskeletal: Moderate to prominent axial loss of articular space in both hips. 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 IMPRESSION: 1. Mild active diverticulitis involving the sigmoid colon. No extraluminal gas or abscess. 2. Scattered air-fluid levels in nondilated loops of small bowel could be from mild enteritis or ileus. 3. 3 cm infrarenal abdominal aortic aneurysm. Recommend followup by ultrasound  in 3 years. This recommendation follows ACR consensus guidelines: White Paper of the ACR Incidental Findings Committee II on Vascular Findings. J Am Coll Radiol 2013; 03:704-888 4. Airway thickening is present, suggesting bronchitis or reactive airways disease. 5. Axial thinning of articular cartilage in both hips. 6. Mild foraminal impingement at multiple levels in the lumbar spine primarily from congenitally short pedicles. Electronically Signed   By: Van Clines M.D.   On: 12/19/2016 09:02    Microbiology: No results found for this or any previous visit (from the past 240 hour(s)).   Labs: Basic Metabolic Panel:  Recent Labs Lab 12/19/16 0727 12/19/16 0738 12/20/16 0714  NA 135 135 136  K 4.0 3.9 4.0  CL 102 101 103  CO2 23  --  24  GLUCOSE 116* 116* 110*  BUN _0 CREATININE 0.88 0.70 0.76  CALCIUM 8.9  --  8.8*   Liver Function Tests:  Recent Labs Lab 12/19/16 0727  AST 19  ALT 33  ALKPHOS 101  BILITOT 0.7  PROT 7.3  ALBUMIN 4.2    Recent Labs Lab 12/19/16 0727  LIPASE 19   No results for input(s): AMMONIA in the last 168 hours. CBC:  Recent Labs Lab 12/19/16 0727 12/19/16 0738 12/20/16 0714 12/22/16 0810  WBC 6.7  --  3.9* 4.8  HGB 15.1 16.3 13.1 13.6  HCT 45.3 48.0 40.4 41.5  MCV 80.9  --  82.6 82.3  PLT 162  --  141* 170   Cardiac Enzymes:  Recent Labs Lab 12/19/16 1443 12/20/16 0802  TROPONINI <0.03 <0.03   BNP: BNP (last 3 results)  Recent Labs  04/04/16 2141  BNP 8.2    ProBNP (last 3 results) No results for input(s): PROBNP in the last 8760 hours.  CBG:  Recent Labs Lab 12/21/16 0814 12/21/16 1239 12/21/16 1734 12/21/16 2149 12/22/16 0819  GLUCAP 114* 159* 119* 163* 112*       Signed:  Nita Sells MD   [facility] 12/22/2016, 10:04 AM

## 2016-12-22 NOTE — Progress Notes (Signed)
[name] to be D/C'd to home per MD order.  Discussed with the patient and all questions fully answered.  VSS, Skin clean, dry and intact without evidence of skin break down, no evidence of skin tears noted. IV catheter discontinued intact. Site without signs and symptoms of complications. Dressing and pressure applied.  An After Visit Summary was printed and given to the patient. Patient received prescriptions.  D/c education completed with patient/family including follow up instructions, medication list, d/c activities limitations if indicated, with other d/c instructions as indicated by MD - patient able to verbalize understanding, all questions fully answered.   Patient instructed to return to ED, call 911, or call MD for any changes in condition.   Patient escorted via [place], and D/C home via private auto.  Morley Kos Price 12/22/2016 1:53 PM

## 2016-12-22 NOTE — Care Management Note (Signed)
Case Management Note  Patient Details  Name: Wesley [name] MRN: [id number] Date of Birth: [birth year]  Subjective/Objective:                 Patient admitted with acute diverticulitis. Lives at home with spouse. [facility]s [facility].  PCP Funches   Action/Plan:  DC to home self care.  Expected Discharge Date:   (unknown)               Expected Discharge Plan:  Home/Self Care  In-House Referral:     Discharge planning Services  CM Consult  Post Acute Care Choice:    Choice offered to:     DME Arranged:    DME Agency:     HH Arranged:    [place] Agency:     Status of Service:  Completed, signed off  If discussed at [employer] of Stay Meetings, dates discussed:    Additional Comments:  Carles Collet, RN 12/22/2016, 10:21 AM

## 2016-12-24 ENCOUNTER — Telehealth: Payer: Self-pay | Admitting: Pulmonary Disease

## 2016-12-24 DIAGNOSIS — G4733 Obstructive sleep apnea (adult) (pediatric): Secondary | ICD-10-CM | POA: Diagnosis not present

## 2016-12-24 DIAGNOSIS — J449 Chronic obstructive pulmonary disease, unspecified: Secondary | ICD-10-CM

## 2016-12-24 NOTE — Telephone Encounter (Signed)
CPAP titration 12/22/16 >> CPAP 10 cm H2O >> AHI 2.8   Will have my nurse inform pt that he did well with during sleep study with CPAP.  Please send order to set him up with CPAP 10 cm H2O with heated humidity and mask of choice.  Please schedule him for ROV with me or NP 2 months after getting CPAP.

## 2016-12-24 NOTE — Procedures (Signed)
     Patient Name: Wesley [name], Wesley [name] Date: 12/22/2016 Gender: Male D.O.B: [birth year] Age (years): 36 Referring Provider: Chesley Mires MD, ABSM Height (inches): 75 Interpreting Physician: Chesley Mires MD, ABSM Weight (lbs): 211 RPSGT: Madelon Lips BMI: 26 MRN: [id number] Neck Size: 16.50  CLINICAL INFORMATION The patient is referred for a CPAP titration to treat sleep apnea.  Date of NPSG: 08/15/16 - AHI 27, SpO2 low 83%  SLEEP STUDY TECHNIQUE As per the AASM Manual for the Scoring of Sleep and Associated Events v2.3 (April 2016) with a hypopnea requiring 4% desaturations.  The channels recorded and monitored were frontal, central and occipital EEG, electrooculogram (EOG), submentalis EMG (chin), nasal and oral airflow, thoracic and abdominal wall motion, anterior tibialis EMG, snore microphone, electrocardiogram, and pulse oximetry. Continuous positive airway pressure (CPAP) was initiated at the beginning of the study and titrated to treat sleep-disordered breathing.  MEDICATIONS Medications self-administered by patient taken the night of the study : N/A  TECHNICIAN COMMENTS Comments added by technician: BATHROOM BREAKS X1  Comments added by scorer: N/A  RESPIRATORY PARAMETERS Optimal PAP Pressure (cm): 10 AHI at Optimal Pressure (/hr): 2.8 Overall Minimal O2 (%): 88.00 Supine % at Optimal Pressure (%): 12 Minimal O2 at Optimal Pressure (%): 89.0    SLEEP ARCHITECTURE The study was initiated at 11:01:47 PM and ended at 5:13:08 AM.  Sleep onset time was 77.0 minutes and the sleep efficiency was 68.8%. The total sleep time was 255.4 minutes.  The patient spent 16.25% of the night in stage N1 sleep, 74.74% in stage N2 sleep, 0.00% in stage N3 and 9.01% in REM.Stage REM latency was 88.5 minutes  Wake after sleep onset was 39.0. Alpha intrusion was absent. Supine sleep was 49.10%.  CARDIAC DATA The 2 lead EKG demonstrated sinus rhythm. The mean heart rate was 61.99  beats per minute. Other EKG findings include: None.  LEG MOVEMENT DATA The total Periodic Limb Movements of Sleep (PLMS) were 0. The PLMS index was 0.00. A PLMS index of <15 is considered normal in adults.  IMPRESSIONS - He did well with CPAP 10 cm H2O.   DIAGNOSIS - Obstructive Sleep Apnea (327.23 [G47.33 ICD-10])  RECOMMENDATIONS - Trial of CPAP therapy on 10 cm H2O. - He was fitted with a Small size [employer] Full Face Mask Simplus mask and heated humidification.  [Electronically signed] 12/24/2016 02:31 PM  Chesley Mires MD, [place], American Board of Sleep Medicine   NPI: [id number]

## 2016-12-29 NOTE — Telephone Encounter (Signed)
lmtcb for pt.  

## 2017-01-01 ENCOUNTER — Inpatient Hospital Stay: Payer: Medicaid Other | Admitting: Family Medicine

## 2017-01-07 NOTE — Telephone Encounter (Signed)
Lmtcb on voicemail.

## 2017-01-08 ENCOUNTER — Other Ambulatory Visit: Payer: Self-pay | Admitting: Pulmonary Disease

## 2017-01-08 DIAGNOSIS — G4733 Obstructive sleep apnea (adult) (pediatric): Secondary | ICD-10-CM

## 2017-01-08 MED FILL — TAMSULOSIN HCL 0.4 MG CAP: 0.4 | 27 days supply | Qty: 60 | Fill #2

## 2017-01-08 MED FILL — MELOXICAM 15 MG TABLET: 15 | 30 days supply | Qty: 30 | Fill #2

## 2017-01-08 MED FILL — METOPROLOL TARTRATE 25 MG T: 25 | 30 days supply | Qty: 60 | Fill #4

## 2017-01-08 NOTE — Telephone Encounter (Signed)
Spoke with patient and informed him of sleep study results. Pt informed to call office the same day he received his machine so a ROV can be set up. He verbalized he understood. Order for CPAP to be placed. Pt stated he did not have any additional questions. Nothing further needed.

## 2017-01-13 ENCOUNTER — Ambulatory Visit: Payer: Medicaid Other | Admitting: Nurse Practitioner

## 2017-01-19 ENCOUNTER — Encounter: Payer: Self-pay | Admitting: Family Medicine

## 2017-01-19 ENCOUNTER — Ambulatory Visit: Payer: Medicaid Other | Attending: Family Medicine | Admitting: Family Medicine

## 2017-01-19 ENCOUNTER — Telehealth: Payer: Self-pay | Admitting: Pulmonary Disease

## 2017-01-19 VITALS — BP 140/92 | HR 63 | Temp 98.2°F | Ht 74.0 in | Wt 214.8 lb

## 2017-01-19 DIAGNOSIS — K219 Gastro-esophageal reflux disease without esophagitis: Secondary | ICD-10-CM | POA: Diagnosis not present

## 2017-01-19 DIAGNOSIS — Z87891 Personal history of nicotine dependence: Secondary | ICD-10-CM | POA: Insufficient documentation

## 2017-01-19 DIAGNOSIS — I495 Sick sinus syndrome: Secondary | ICD-10-CM | POA: Diagnosis not present

## 2017-01-19 DIAGNOSIS — N400 Enlarged prostate without lower urinary tract symptoms: Secondary | ICD-10-CM | POA: Insufficient documentation

## 2017-01-19 DIAGNOSIS — Z79899 Other long term (current) drug therapy: Secondary | ICD-10-CM | POA: Insufficient documentation

## 2017-01-19 DIAGNOSIS — Z7982 Long term (current) use of aspirin: Secondary | ICD-10-CM | POA: Diagnosis not present

## 2017-01-19 DIAGNOSIS — Z950 Presence of cardiac pacemaker: Secondary | ICD-10-CM | POA: Diagnosis not present

## 2017-01-19 DIAGNOSIS — Z8673 Personal history of transient ischemic attack (TIA), and cerebral infarction without residual deficits: Secondary | ICD-10-CM | POA: Diagnosis not present

## 2017-01-19 DIAGNOSIS — J449 Chronic obstructive pulmonary disease, unspecified: Secondary | ICD-10-CM | POA: Diagnosis not present

## 2017-01-19 DIAGNOSIS — K5792 Diverticulitis of intestine, part unspecified, without perforation or abscess without bleeding: Secondary | ICD-10-CM

## 2017-01-19 DIAGNOSIS — Z7984 Long term (current) use of oral hypoglycemic drugs: Secondary | ICD-10-CM | POA: Insufficient documentation

## 2017-01-19 DIAGNOSIS — I10 Essential (primary) hypertension: Secondary | ICD-10-CM | POA: Diagnosis not present

## 2017-01-19 DIAGNOSIS — E119 Type 2 diabetes mellitus without complications: Secondary | ICD-10-CM

## 2017-01-19 DIAGNOSIS — G4733 Obstructive sleep apnea (adult) (pediatric): Secondary | ICD-10-CM

## 2017-01-19 LAB — HEMOCCULT GUIAC POC 1CARD (OFFICE): FECAL OCCULT BLD: NEGATIVE

## 2017-01-19 LAB — GLUCOSE, POCT (MANUAL RESULT ENTRY): POC Glucose: 114 mg/dl — AB (ref 70–99)

## 2017-01-19 MED ORDER — CLONIDINE HCL 0.1 MG PO TABS
0.1000 mg | ORAL_TABLET | Freq: Once | ORAL | Status: AC
  Administered 2017-01-19: 0.1 mg via ORAL

## 2017-01-19 MED ORDER — PANTOPRAZOLE SODIUM 40 MG PO TBEC: 40.0000 mg | DELAYED_RELEASE_TABLET | Freq: Every day | ORAL | 5 refills | Status: DC

## 2017-01-19 MED FILL — ?PANTOPRAZOLE SOD DR 40MG: 40 MG | 30 days supply | Qty: 30 | Fill #0

## 2017-01-19 NOTE — Patient Instructions (Addendum)
Diagnoses and all orders for this visit:  Controlled type 2 diabetes mellitus without complication, without long-term current use of insulin (HCC) -     Glucose (CBG)  Acute diverticulitis -     Cancel: Fecal Occult Blood, Guaiac -     Hemoccult - 1 Card (office)  Obstructive sleep apnea  Gastroesophageal reflux disease, esophagitis presence not specified -     pantoprazole (PROTONIX) 40 MG tablet; Take 1 tablet (40 mg total) by mouth daily.  Essential hypertension -     cloNIDine (CATAPRES) tablet 0.1 mg; Take 1 tablet (0.1 mg total) by mouth once.   Restart lisinopril for HTN Restart Protonix for GERD  Follow up  with Dr. Markus Daft Pulmonology for obstructive sleep apnea to obtain CPAP  On Tuesday 01/20/2017 at 9 am [address], [place] Phone: [phone]   F/u in 1 month for HTN   Dr. Adrian Blackwater

## 2017-01-19 NOTE — Assessment & Plan Note (Signed)
F/u with pulmonology scheduled

## 2017-01-19 NOTE — Telephone Encounter (Signed)
Spoke with Maudie Mercury at [facility], states that the patient was never set up with CPAP machine and they have unable to reach him to schedule this. Pt is scheduled with [facility] on 01/20/17 and the appt is not needed.  Pt needs to contact [facility] to set up CPAP machine and also needs to reschedule for 2 months with [facility].   LM for patient.

## 2017-01-19 NOTE — Progress Notes (Signed)
Subjective:  Patient ID: Wesley [name], male    DOB: 02/16/1956  Age: 61 y.o. MRN: [id number]  CC:   HPI Wesley [name] has hx of CVA, HTN, DM, hx of substance abuse, COPD,  Sick sinus syndrome s/p pacemaker, BPH he presents with his wife for   1. Hospitalization follow up: he was hospitalized from 1/5-12/22/2016 for abdominal pain. He was found to have diverticulitis. On CT abdomen. He was treated with cipro and flagyl. He also had an episode os staring off during his hospitalization. Neurology was consulted. Seizure was not suspected. He was set up for f/u sleep study. He completed sleep study on 12/22/2016. He has OSA controlled with CPAP at 10 cm H2O. CPAP is recommended. He has f/u with neurology in 2 days.    2. OSA: initial sleep study done on 08/15/2016 revealed moderate OSA with recommendation for therapeutic CPAP titration to determine optimal pressure required to alleviate sleep disordered breathing. He completed CPAP titration on 12/22/2016. He has been referred to pulmonology. He will f/u with pulmonology to start CPAP therapy.   3. HTN: he reports that he has not had lisinopril in 3 days. He still has intermittent dizziness associated with sweating that is relieved with rest.He denies room spinning.    Social History  Substance Use Topics  . Smoking status: Former Smoker    Packs/day: 1.00    Years: 40.00    Types: Cigarettes    Quit date: 08/06/2016  . Smokeless tobacco: Never Used     Comment: pt still smokes every now and again  . Alcohol use No     Comment: hx of ETOH abuse from 2001-2016    Outpatient Medications Prior to Visit  Medication Sig Dispense Refill  . albuterol (PROVENTIL HFA;VENTOLIN HFA) 108 (90 Base) MCG/ACT inhaler Inhale 2 puffs into the lungs every 6 (six) hours as needed for wheezing or shortness of breath. 1 Inhaler 2  . aspirin 325 MG tablet Take 325 mg by mouth daily.    Marland Kitchen atorvastatin (LIPITOR) 40 MG tablet Take 1 tablet (40 mg total) by mouth  daily at 6 PM. 30 tablet 2  . Blood Glucose Monitoring Suppl (ACCU-CHEK AVIVA PLUS) w/Device KIT 1 Device by Does not apply route 4 (four) times daily. 1 kit 0  . cetirizine (ZYRTEC) 10 MG tablet Take 1 tablet (10 mg total) by mouth daily. 30 tablet 11  . Cholecalciferol (VITAMIN D3) 2000 UNITS TABS Take 2,000 Units by mouth daily. (Patient taking differently: Take 2,000 Units by mouth every morning. ) 30 tablet 11  . fluticasone furoate-vilanterol (BREO ELLIPTA) 100-25 MCG/INH AEPB Inhale 1 puff into the lungs daily. 30 each 5  . glucose blood (ACCU-CHEK AVIVA) test strip Use as instructed 100 each 12  . guaiFENesin (ROBITUSSIN) 100 MG/5ML SOLN Take 10 mLs (200 mg total) by mouth every 4 (four) hours as needed for cough or to loosen phlegm. 1200 mL 0  . lisinopril (PRINIVIL,ZESTRIL) 40 MG tablet Take 40 mg by mouth daily.   5  . meloxicam (MOBIC) 15 MG tablet Take 1 tablet (15 mg total) by mouth daily. (Patient taking differently: Take 15 mg by mouth every morning. ) 30 tablet 2  . metFORMIN (GLUCOPHAGE) 500 MG tablet Take 1 tablet (500 mg total) by mouth 2 (two) times daily with a meal. 60 tablet 1  . metoprolol tartrate (LOPRESSOR) 25 MG tablet Take 25 mg by mouth daily.   3  . metroNIDAZOLE (FLAGYL) 500 MG tablet Take  1 tablet (500 mg total) by mouth every 8 (eight) hours. 18 tablet 0  . pantoprazole (PROTONIX) 40 MG tablet Take 1 tablet (40 mg total) by mouth daily. (Patient taking differently: Take 40 mg by mouth every morning. ) 30 tablet 0  . sertraline (ZOLOFT) 100 MG tablet Take 100 mg by mouth daily.    . tamsulosin (FLOMAX) 0.4 MG CAPS capsule 0.4 mg by mouth nightly after supper for two weeks, then 0.8 mg nightly (Patient taking differently: Take 0.4 mg by mouth at bedtime. ) 60 capsule 5  . benzonatate (TESSALON) 100 MG capsule TAKE 1 CAPSULE BY MOUTH 3 TIMES DAILY AS NEEDED FOR COUGH. (Patient not taking: Reported on 01/19/2017) 30 capsule 0  . ciprofloxacin (CIPRO) 500 MG tablet Take 1  tablet (500 mg total) by mouth 2 (two) times daily. (Patient not taking: Reported on 01/19/2017) 12 tablet 0  . Elastic Bandages & Supports (WRIST SPLINT/ELASTIC LEFT LG) MISC 1 each by Does not apply route daily. (Patient not taking: Reported on 01/19/2017) 1 each 0  . Elastic Bandages & Supports (WRIST SPLINT/ELASTIC RIGHT LG) MISC 1 each by Does not apply route at bedtime. (Patient not taking: Reported on 01/19/2017) 1 each 0  . meclizine (ANTIVERT) 25 MG tablet Take 1 tablet (25 mg total) by mouth 2 (two) times daily as needed. (Patient not taking: Reported on 12/19/2016) 60 tablet 0   No facility-administered medications prior to visit.     ROS Review of Systems  Constitutional: Negative for chills, fatigue, fever and unexpected weight change.  Eyes: Negative for visual disturbance.  Respiratory: Negative for cough and shortness of breath.   Cardiovascular: Negative for chest pain, palpitations and leg swelling.  Gastrointestinal: Negative for abdominal pain, blood in stool, constipation, diarrhea, nausea and vomiting.  Endocrine: Negative for polydipsia, polyphagia and polyuria.  Genitourinary: Negative for dysuria.       Erectile dysfunction   Musculoskeletal: Positive for back pain. Negative for arthralgias, gait problem, myalgias and neck pain.  Skin: Negative for rash.  Allergic/Immunologic: Negative for immunocompromised state.  Neurological: Positive for dizziness, tremors, light-headedness and numbness. Negative for seizures, syncope, facial asymmetry, speech difficulty, weakness and headaches.  Hematological: Negative for adenopathy. Does not bruise/bleed easily.  Psychiatric/Behavioral: Positive for sleep disturbance. Negative for dysphoric mood and suicidal ideas. The patient is not nervous/anxious.     Objective:  BP (!) 156/103 (BP Location: Left Arm, Patient Position: Sitting, Cuff Size: Small)   Pulse 63   Temp 98.2 F (36.8 C) (Oral)   Ht _0  (1.88 m)   Wt 214 lb 12.8  oz (97.4 kg)   SpO2 97%   BMI 27.58 kg/m   BP/Weight 01/19/2017 07/23/3809 12/21/5100  Systolic BP 585 - 277  Diastolic BP 824 - 72  Wt. (Lbs) 214.8 215 -  BMI 27.58 28.37 -    Physical Exam  Constitutional: He appears well-developed and well-nourished. No distress.  HENT:  Head: Normocephalic and atraumatic.  Eyes: Conjunctivae and EOM are normal. Pupils are equal, round, and reactive to light.  Neck: Normal range of motion. Neck supple.  Cardiovascular: Normal rate, regular rhythm, normal heart sounds and intact distal pulses.   Pulmonary/Chest: Effort normal and breath sounds normal.  Abdominal: Soft. Bowel sounds are normal. He exhibits no distension and no mass. There is no tenderness. There is no rebound and no guarding.  Genitourinary: Rectal exam shows guaiac negative stool (firm stool in rectal vault ).  Musculoskeletal: He exhibits no edema.  Neurological:  He is alert.  Skin: Skin is warm and dry. No rash noted. No erythema.  Psychiatric: He has a normal mood and affect.    Lab Results  Component Value Date   HGBA1C 6.3 (H) 12/20/2016  CBG 114  Treated with clonidine Repeat BP 140/92  Assessment & Plan:  Wesley [name] was seen today for hospitalization follow-up.  Diagnoses and all orders for this visit:  Controlled type 2 diabetes mellitus without complication, without long-term current use of insulin (HCC) -     Glucose (CBG)  Acute diverticulitis -     Cancel: Fecal Occult Blood, Guaiac -     Hemoccult - 1 Card (office)  Obstructive sleep apnea  Gastroesophageal reflux disease, esophagitis presence not specified -     pantoprazole (PROTONIX) 40 MG tablet; Take 1 tablet (40 mg total) by mouth daily.  Essential hypertension -     cloNIDine (CATAPRES) tablet 0.1 mg; Take 1 tablet (0.1 mg total) by mouth once.   There are no diagnoses linked to this encounter.  No orders of the defined types were placed in this encounter.   Follow-up: Return in about 4 weeks  (around 02/16/2017) for HTN .   Boykin Nearing MD

## 2017-01-19 NOTE — Assessment & Plan Note (Signed)
HTN Med: non compliant Treated with clonidine x 0.1 mg PO x 1 Restart lisinopril

## 2017-01-19 NOTE — Assessment & Plan Note (Signed)
Resolved Heme negative stools Patient completed cipro and flagyl

## 2017-01-20 ENCOUNTER — Ambulatory Visit: Payer: Medicaid Other | Admitting: Pulmonary Disease

## 2017-01-20 NOTE — Telephone Encounter (Signed)
Patient called back- advised him to contact [facility] and set up cpap delivery. He does need to follow up after receiving cpap in 2 months -patient states that he will call [facility] and call us back once he has cpap - pr

## 2017-01-21 ENCOUNTER — Ambulatory Visit (INDEPENDENT_AMBULATORY_CARE_PROVIDER_SITE_OTHER): Payer: Medicaid Other | Admitting: Nurse Practitioner

## 2017-01-21 ENCOUNTER — Other Ambulatory Visit: Payer: Self-pay | Admitting: Family Medicine

## 2017-01-21 ENCOUNTER — Encounter: Payer: Self-pay | Admitting: Nurse Practitioner

## 2017-01-21 VITALS — BP 138/92 | HR 64 | Ht 74.0 in | Wt 213.8 lb

## 2017-01-21 DIAGNOSIS — R55 Syncope and collapse: Secondary | ICD-10-CM

## 2017-01-21 DIAGNOSIS — I10 Essential (primary) hypertension: Secondary | ICD-10-CM

## 2017-01-21 DIAGNOSIS — E7800 Pure hypercholesterolemia, unspecified: Secondary | ICD-10-CM | POA: Diagnosis not present

## 2017-01-21 DIAGNOSIS — M25562 Pain in left knee: Secondary | ICD-10-CM

## 2017-01-21 DIAGNOSIS — I6330 Cerebral infarction due to thrombosis of unspecified cerebral artery: Secondary | ICD-10-CM | POA: Diagnosis not present

## 2017-01-21 MED FILL — ROBAFEN 100 MG/5 ML SYRUP: 100 | 11 days supply | Qty: 473 | Fill #1

## 2017-01-21 MED FILL — SERTRALINE HCL 100 MG TAB: 100 | 30 days supply | Qty: 30 | Fill #2

## 2017-01-21 MED FILL — ACCU-CHEK AVIVA PLUS TEST S: 25 days supply | Qty: 100 | Fill #1

## 2017-01-21 MED FILL — MELOXICAM 15 MG TABLET: 15 | 30 days supply | Qty: 30 | Fill #0

## 2017-01-21 MED FILL — LISINOPRIL 40 MG TABLET: 40 | 30 days supply | Qty: 30 | Fill #2

## 2017-01-21 MED FILL — ATORVASTATIN 40 MG TABLET: 40 | 30 days supply | Qty: 30 | Fill #2

## 2017-01-21 NOTE — Patient Instructions (Signed)
Continue aspirin 325mg  for secondary stroke prevention Maintain strict control of hypertension with blood pressure goal below 130/90, today's reading 138/92 continue antihypertensive medications Control of diabetes with hemoglobin A1c below 6.5 followed by primary care Continue metformin Cholesterol with LDL cholesterol less than 70, followed by primary care,  most recent 89 continue Lipitor Exercise by walking daily  eat healthy diet with whole grains,  fresh fruits and vegetables, lean meats Follow-up in 6 months
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
## 2017-01-22 NOTE — Progress Notes (Signed)
I reviewed above note and agree with the assessment and plan.  Agree with Dr. Oren Bracket, if he has recurrent spells and BP not low during the spell, may consider AED trial. Thanks.   Rosalin Hawking, MD PhD Stroke Neurology 01/22/2017 9:51 PM

## 2017-01-28 ENCOUNTER — Telehealth: Payer: Self-pay | Admitting: Family Medicine

## 2017-01-28 NOTE — Telephone Encounter (Signed)
Patient expressing concerns for green stool, stomach swelling, and all over body itch. States he has a pacemaker. Please f/u

## 2017-01-28 NOTE — Telephone Encounter (Signed)
Green stools and abdomen bloating for 3 days. Denies abdominal pain. He states the only pain he has is to his legs d/t swelling in bilateral  lower extremities pain. When  patient asked if he has had difficulty breathing, he stated no, stated he has COPD. Denies chest pain. Hasn't been able to take afford medications, so hasn't taken medications in 3-4 days. Pt wanted to know if he could take a benadryl for itching. Pt informed the itching could be r/t to edema. He denies seeing a rash or hives. Just noticed areas are red. Please advise.

## 2017-01-28 NOTE — Telephone Encounter (Signed)
He needs an OV, will need to have stool studies. Place on any available provider schedule.

## 2017-01-29 NOTE — Telephone Encounter (Signed)
Received a call from pt. And scheduled him an appt. With Levada Dy for 01/30/17

## 2017-01-30 ENCOUNTER — Ambulatory Visit: Payer: Medicaid Other | Attending: Family Medicine | Admitting: Physician Assistant

## 2017-01-30 VITALS — BP 165/106 | HR 60 | Temp 97.5°F | Resp 16 | Wt 215.4 lb

## 2017-01-30 DIAGNOSIS — Z7984 Long term (current) use of oral hypoglycemic drugs: Secondary | ICD-10-CM | POA: Insufficient documentation

## 2017-01-30 DIAGNOSIS — E785 Hyperlipidemia, unspecified: Secondary | ICD-10-CM | POA: Diagnosis not present

## 2017-01-30 DIAGNOSIS — Z8673 Personal history of transient ischemic attack (TIA), and cerebral infarction without residual deficits: Secondary | ICD-10-CM | POA: Diagnosis not present

## 2017-01-30 DIAGNOSIS — Z880 Allergy status to penicillin: Secondary | ICD-10-CM | POA: Insufficient documentation

## 2017-01-30 DIAGNOSIS — N400 Enlarged prostate without lower urinary tract symptoms: Secondary | ICD-10-CM | POA: Insufficient documentation

## 2017-01-30 DIAGNOSIS — G250 Essential tremor: Secondary | ICD-10-CM | POA: Diagnosis not present

## 2017-01-30 DIAGNOSIS — I10 Essential (primary) hypertension: Secondary | ICD-10-CM | POA: Insufficient documentation

## 2017-01-30 DIAGNOSIS — Z7982 Long term (current) use of aspirin: Secondary | ICD-10-CM | POA: Insufficient documentation

## 2017-01-30 DIAGNOSIS — E119 Type 2 diabetes mellitus without complications: Secondary | ICD-10-CM | POA: Insufficient documentation

## 2017-01-30 DIAGNOSIS — L299 Pruritus, unspecified: Secondary | ICD-10-CM | POA: Diagnosis not present

## 2017-01-30 DIAGNOSIS — R110 Nausea: Secondary | ICD-10-CM

## 2017-01-30 DIAGNOSIS — R1084 Generalized abdominal pain: Secondary | ICD-10-CM | POA: Diagnosis not present

## 2017-01-30 DIAGNOSIS — J449 Chronic obstructive pulmonary disease, unspecified: Secondary | ICD-10-CM | POA: Diagnosis not present

## 2017-01-30 DIAGNOSIS — Z79899 Other long term (current) drug therapy: Secondary | ICD-10-CM | POA: Diagnosis not present

## 2017-01-30 DIAGNOSIS — I252 Old myocardial infarction: Secondary | ICD-10-CM | POA: Insufficient documentation

## 2017-01-30 DIAGNOSIS — R195 Other fecal abnormalities: Secondary | ICD-10-CM

## 2017-01-30 DIAGNOSIS — R109 Unspecified abdominal pain: Secondary | ICD-10-CM | POA: Diagnosis present

## 2017-01-30 LAB — CBC WITH DIFFERENTIAL/PLATELET
BASOS PCT: 1 %
Basophils Absolute: 43 cells/uL (ref 0–200)
Eosinophils Absolute: 86 cells/uL (ref 15–500)
Eosinophils Relative: 2 %
HEMATOCRIT: 44.9 % (ref 38.5–50.0)
HEMOGLOBIN: 14.6 g/dL (ref 13.2–17.1)
LYMPHS ABS: 2193 {cells}/uL (ref 850–3900)
Lymphocytes Relative: 51 %
MCH: 26.4 pg — ABNORMAL LOW (ref 27.0–33.0)
MCHC: 32.5 g/dL (ref 32.0–36.0)
MCV: 81.3 fL (ref 80.0–100.0)
MONO ABS: 301 {cells}/uL (ref 200–950)
MPV: 10.8 fL (ref 7.5–12.5)
Monocytes Relative: 7 %
NEUTROS PCT: 39 %
Neutro Abs: 1677 cells/uL (ref 1500–7800)
Platelets: 179 10*3/uL (ref 140–400)
RBC: 5.52 MIL/uL (ref 4.20–5.80)
RDW: 14.9 % (ref 11.0–15.0)
WBC: 4.3 10*3/uL (ref 3.8–10.8)

## 2017-01-30 LAB — COMPREHENSIVE METABOLIC PANEL
ALT: 27 U/L (ref 9–46)
AST: 17 U/L (ref 10–35)
Albumin: 4.1 g/dL (ref 3.6–5.1)
Alkaline Phosphatase: 95 U/L (ref 40–115)
BUN: 16 mg/dL (ref 7–25)
CO2: 23 mmol/L (ref 20–31)
Calcium: 9.9 mg/dL (ref 8.6–10.3)
Chloride: 105 mmol/L (ref 98–110)
Creat: 0.85 mg/dL (ref 0.70–1.25)
Glucose, Bld: 137 mg/dL — ABNORMAL HIGH (ref 65–99)
POTASSIUM: 4 mmol/L (ref 3.5–5.3)
Sodium: 140 mmol/L (ref 135–146)
TOTAL PROTEIN: 7.2 g/dL (ref 6.1–8.1)
Total Bilirubin: 0.3 mg/dL (ref 0.2–1.2)

## 2017-01-30 LAB — GLUCOSE, POCT (MANUAL RESULT ENTRY): POC GLUCOSE: 174 mg/dL — AB (ref 70–99)

## 2017-01-30 MED ORDER — CETIRIZINE HCL 10 MG PO TABS: 10.0000 mg | ORAL_TABLET | Freq: Every day | ORAL | 11 refills | Status: DC

## 2017-01-30 MED ORDER — ONDANSETRON HCL 8 MG PO TABS: ORAL_TABLET | ORAL | 0 refills | Status: DC

## 2017-01-30 NOTE — Telephone Encounter (Signed)
Agree with recommendations and follow up plan

## 2017-01-30 NOTE — Progress Notes (Signed)
Wesley [name], is a 61 y.o. male  OVF:[id number]  ACZ:[id number]  DOB - October 05, 1956  Subjective:  Chief Complaint and HPI: Wesley [name] is a 61 y.o. male here today for intermittent mild abdominal pain, greenish stools, and generalized itching.  He had diverticulitis last month that was treated with Cipro and Flagyl.  His abdominal pain improved and stools were normal until about 3 days ago.  He has had 3 loose stools per day that have been green in color.  No diet changes.  No recent travel.  No fever.  Abdomen cramps and bloats but not severe pain.  He also c/o decreased appetite and nausea without vomiting.  No new meds or foods.    He also c/o generalized itching without rash.  He usu takes zyrtec daily but hasn't been taking any for a few weeks.  He is out of his BP meds for a couple of weeks.  Denies CP/SOB/Dizziness.  No new stroke type symptoms.  Saw neurology 01/21/2017.  Still hasn't gotten BP cuff for home.  Social: married; wife here with him today.  ROS:   Constitutional:  No f/c, No night sweats, No unexplained weight loss. EENT:  No vision changes, No blurry vision, No hearing changes. No mouth, throat, or ear problems.  Respiratory: No cough, No SOB Cardiac: No CP, no palpitations GI:  + abd pain, +N, no V/D. GU: No Urinary s/sx Musculoskeletal: No joint pain Neuro: No headache, no dizziness, no motor weakness.  Skin: No rash Endocrine:  No polydipsia. No polyuria.  Psych: Denies SI/HI  No problems updated.  ALLERGIES: Allergies  Allergen Reactions  . Penicillins Swelling    Has patient had a PCN reaction causing immediate rash, facial/tongue/throat swelling, SOB or lightheadedness with hypotension: unknown Has patient had a PCN reaction causing severe rash involving mucus membranes or skin necrosis: unknown Has patient had a PCN reaction that required hospitalization : unknown Has patient had a PCN reaction occurring within the last 10 years: no If all of  the above answers are "NO", then may proceed with Cephalosporin use.     PAST MEDICAL HISTORY: Past Medical History:  Diagnosis Date  . Arthritis    knees  . Blood transfusion without reported diagnosis    during pacemaker surger  . BPH (benign prostatic hyperplasia)   . Cataract    bilateral  . Chronic insomnia 03/31/2016  . COPD (chronic obstructive pulmonary disease) ([place])   . Hyperlipidemia   . Hypertension   . Left-sided weakness 08/28/2016  . Myocardial infarction   . Pacemaker   . Shortness of breath   . Stroke ([place])   . Substance abuse   . Tremor, essential 03/31/2016  . Tremors of nervous system   . Tuberculosis    2000    MEDICATIONS AT HOME: Prior to Admission medications   Medication Sig Start Date End Date Taking? Authorizing Provider  albuterol (PROVENTIL HFA;VENTOLIN HFA) 108 (90 Base) MCG/ACT inhaler Inhale 2 puffs into the lungs every 6 (six) hours as needed for wheezing or shortness of breath. 04/22/16   Mihai Croitoru, MD  aspirin 325 MG tablet Take 325 mg by mouth daily.    Historical Provider, MD  atorvastatin (LIPITOR) 40 MG tablet Take 1 tablet (40 mg total) by mouth daily at 6 PM. 08/31/16   Verlee Monte, MD  Blood Glucose Monitoring Suppl (ACCU-CHEK AVIVA PLUS) w/Device KIT 1 Device by Does not apply route 4 (four) times daily. 09/02/16   Brayton Caves, PA-C  cetirizine (ZYRTEC) 10 MG tablet Take 1 tablet (10 mg total) by mouth daily. 01/30/17   Argentina Donovan, PA-C  Cholecalciferol (VITAMIN D3) 2000 UNITS TABS Take 2,000 Units by mouth daily. Patient taking differently: Take 2,000 Units by mouth every morning.  11/05/15   Boykin Nearing, MD  Elastic Bandages & Supports (WRIST SPLINT/ELASTIC LEFT LG) MISC 1 each by Does not apply route daily. 04/15/16   Boykin Nearing, MD  Elastic Bandages & Supports (WRIST SPLINT/ELASTIC RIGHT LG) MISC 1 each by Does not apply route at bedtime. 03/17/16   Josalyn Funches, MD  fluticasone furoate-vilanterol (BREO ELLIPTA)  100-25 MCG/INH AEPB Inhale 1 puff into the lungs daily. 11/18/16   Chesley Mires, MD  glucose blood (ACCU-CHEK AVIVA) test strip Use as instructed 09/02/16   Brayton Caves, PA-C  guaiFENesin (ROBITUSSIN) 100 MG/5ML SOLN Take 10 mLs (200 mg total) by mouth every 4 (four) hours as needed for cough or to loosen phlegm. Patient not taking: Reported on 01/30/2017 12/22/16   Nita Sells, MD  lisinopril (PRINIVIL,ZESTRIL) 40 MG tablet Take 40 mg by mouth daily.  03/17/16   Historical Provider, MD  meloxicam (MOBIC) 15 MG tablet TAKE 1 TABLET BY MOUTH DAILY. 01/21/17   Boykin Nearing, MD  metFORMIN (GLUCOPHAGE) 500 MG tablet Take 1 tablet (500 mg total) by mouth 2 (two) times daily with a meal. 08/31/16   Verlee Monte, MD  ondansetron (ZOFRAN) 8 MG tablet 1/2-1 as needed for nausea 01/30/17   Argentina Donovan, PA-C  pantoprazole (PROTONIX) 40 MG tablet Take 1 tablet (40 mg total) by mouth daily. 01/19/17   Boykin Nearing, MD  sertraline (ZOLOFT) 100 MG tablet Take 100 mg by mouth daily.    Historical Provider, MD  tamsulosin (FLOMAX) 0.4 MG CAPS capsule 0.4 mg by mouth nightly after supper for two weeks, then 0.8 mg nightly Patient taking differently: Take 0.4 mg by mouth at bedtime.  10/03/16   Josalyn Funches, MD     Objective:  EXAM:   Vitals:   01/30/17 0923  BP: (!) 165/106  Pulse: 60  Resp: 16  Temp: 97.5 F (36.4 C)  TempSrc: Oral  SpO2: 96%  Weight: 215 lb 6.4 oz (97.7 kg)    General appearance : A&OX3. NAD. Non-toxic-appearing HEENT: Atraumatic and Normocephalic.  PERRLA. EOM intact.  TM clear B. Mouth-MMM, post pharynx WNL w/o erythema, No PND. Neck: supple, no JVD. No cervical lymphadenopathy. No thyromegaly Chest/Lungs:  Breathing-non-labored, Good air entry bilaterally, breath sounds normal without rales, rhonchi, or wheezing  CVS: S1 S2 regular, no murmurs, gallops, rubs  Abdomen: Bowel sounds present, Non tender and not distended with no gaurding, rigidity or rebound.  He is  generally and mildly TTP throughout.  No ascites/fluid wave.  Extremities: Bilateral Lower Ext shows no edema, both legs are warm to touch with = pulse throughout Neurology:  CN II-XII grossly intact, Non focal.   Psych:  TP linear. J/I WNL. Normal speech. Appropriate eye contact and affect.  Skin:  No Rash  Data Review Lab Results  Component Value Date   HGBA1C 6.3 (H) 12/20/2016   HGBA1C 6.8 (H) 08/29/2016   HGBA1C 6.7 07/22/2016     Assessment & Plan   1. Generalized abdominal pain-mild Hopefully resolving viral syndrome as he does say his stool is more normal today and he is having less pain. Non-acute abdomen at the present time.  Reviewed 12/19/2016 CT abdomen.   - Comprehensive metabolic panel - CBC with Differential/Platelet  2. Controlled  type 2 diabetes mellitus without complication, without long-term current use of insulin (HCC) suboptimal control.  Continue current regimen and continue to work on dietary choices - POCT glucose (manual entry) - Comprehensive metabolic panel  3. Nausea - ondansetron (ZOFRAN) 8 MG tablet; 1/2-1 as needed for nausea  Dispense: 30 tablet; Refill: 0 - Comprehensive metabolic panel  4. Itching-no visible rash/exanthem LFTs WNL 12/19/2016 Liver on CT WNL 12/19/2016 - Comprehensive metabolic panel - cetirizine (ZYRTEC) 10 MG tablet; Take 1 tablet (10 mg total) by mouth daily.  Dispense: 30 tablet; Refill: 11  5. Loose stools - Stool culture  6. Essential hypertension Uncontrolled but out of meds.  Get meds and resume today!!  Patient have been counseled extensively about nutrition and exercise  Return in about 2 weeks (around 02/13/2017) for f/up funches for memory issues and BP.  The patient was given clear instructions to go to ER or return to medical center if symptoms don't improve, worsen or new problems develop. The patient verbalized understanding. The patient was told to call to get lab results if they haven't heard anything in  the next week.     Freeman Caldron, PA-C [facility] [place], [place]   01/30/2017, 10:31 AMPatient ID: Wesley [name], male   DOB: May 05, 1956, 61 y.o.   MRN: [id number]

## 2017-02-02 NOTE — Telephone Encounter (Signed)
Spoke with patient who stated he received his CPAP machine around 01/26/17. A follow up appointment was scheduled for 03/26/17 at 75 with Dr. Halford Chessman. Nothing further is needed.

## 2017-02-05 ENCOUNTER — Telehealth: Payer: Self-pay

## 2017-02-05 NOTE — Telephone Encounter (Signed)
Contacted pt to go over lab results pt is aware of results and doesn't have any questions or or concerns

## 2017-02-06 ENCOUNTER — Ambulatory Visit: Payer: Medicaid Other | Admitting: Nurse Practitioner

## 2017-02-16 ENCOUNTER — Encounter: Payer: Self-pay | Admitting: Family Medicine

## 2017-02-16 ENCOUNTER — Ambulatory Visit: Payer: Medicaid Other | Attending: Family Medicine | Admitting: Family Medicine

## 2017-02-16 VITALS — BP 127/87 | HR 60 | Temp 97.6°F | Ht 74.0 in | Wt 211.0 lb

## 2017-02-16 DIAGNOSIS — F329 Major depressive disorder, single episode, unspecified: Secondary | ICD-10-CM | POA: Insufficient documentation

## 2017-02-16 DIAGNOSIS — J449 Chronic obstructive pulmonary disease, unspecified: Secondary | ICD-10-CM | POA: Diagnosis not present

## 2017-02-16 DIAGNOSIS — N401 Enlarged prostate with lower urinary tract symptoms: Secondary | ICD-10-CM

## 2017-02-16 DIAGNOSIS — Z7984 Long term (current) use of oral hypoglycemic drugs: Secondary | ICD-10-CM | POA: Diagnosis not present

## 2017-02-16 DIAGNOSIS — Z79899 Other long term (current) drug therapy: Secondary | ICD-10-CM | POA: Diagnosis not present

## 2017-02-16 DIAGNOSIS — H905 Unspecified sensorineural hearing loss: Secondary | ICD-10-CM | POA: Diagnosis not present

## 2017-02-16 DIAGNOSIS — H903 Sensorineural hearing loss, bilateral: Secondary | ICD-10-CM | POA: Diagnosis not present

## 2017-02-16 DIAGNOSIS — E7800 Pure hypercholesterolemia, unspecified: Secondary | ICD-10-CM | POA: Diagnosis not present

## 2017-02-16 DIAGNOSIS — E119 Type 2 diabetes mellitus without complications: Secondary | ICD-10-CM

## 2017-02-16 DIAGNOSIS — F418 Other specified anxiety disorders: Secondary | ICD-10-CM

## 2017-02-16 DIAGNOSIS — F32A Depression, unspecified: Secondary | ICD-10-CM

## 2017-02-16 DIAGNOSIS — I10 Essential (primary) hypertension: Secondary | ICD-10-CM | POA: Diagnosis not present

## 2017-02-16 DIAGNOSIS — F17210 Nicotine dependence, cigarettes, uncomplicated: Secondary | ICD-10-CM | POA: Diagnosis not present

## 2017-02-16 DIAGNOSIS — R338 Other retention of urine: Secondary | ICD-10-CM

## 2017-02-16 DIAGNOSIS — F419 Anxiety disorder, unspecified: Secondary | ICD-10-CM | POA: Diagnosis not present

## 2017-02-16 DIAGNOSIS — Z950 Presence of cardiac pacemaker: Secondary | ICD-10-CM | POA: Diagnosis not present

## 2017-02-16 DIAGNOSIS — Z7982 Long term (current) use of aspirin: Secondary | ICD-10-CM | POA: Insufficient documentation

## 2017-02-16 LAB — GLUCOSE, POCT (MANUAL RESULT ENTRY): POC Glucose: 132 mg/dl — AB (ref 70–99)

## 2017-02-16 MED ORDER — LISINOPRIL 40 MG PO TABS: 40.0000 mg | ORAL_TABLET | Freq: Every day | ORAL | 11 refills | Status: DC

## 2017-02-16 MED ORDER — TAMSULOSIN HCL 0.4 MG PO CAPS: 0.8000 mg | ORAL_CAPSULE | Freq: Every day | ORAL | 5 refills | Status: DC

## 2017-02-16 MED ORDER — ATORVASTATIN CALCIUM 40 MG PO TABS: 40.0000 mg | ORAL_TABLET | Freq: Every day | ORAL | 11 refills | Status: DC

## 2017-02-16 MED ORDER — ESCITALOPRAM OXALATE 20 MG PO TABS: 20.0000 mg | ORAL_TABLET | Freq: Every day | ORAL | 5 refills | Status: DC

## 2017-02-16 MED FILL — ESCITALOPRAM 20 MG TABLET: 20 | 30 days supply | Qty: 30 | Fill #0

## 2017-02-16 MED FILL — TAMSULOSIN HCL 0.4 MG CAP: 0.4 | 27 days supply | Qty: 60 | Fill #3

## 2017-02-16 MED FILL — LISINOPRIL 40 MG TABLET: 40 | 30 days supply | Qty: 30 | Fill #0

## 2017-02-16 MED FILL — ATORVASTATIN 40 MG TABLET: 40 | 30 days supply | Qty: 30 | Fill #0

## 2017-02-16 NOTE — Assessment & Plan Note (Signed)
Worsening chronic anxiety and depression Transfer from Zoloft to Lexapro

## 2017-02-16 NOTE — Assessment & Plan Note (Signed)
Improved with treatment Continue flomax

## 2017-02-16 NOTE — Progress Notes (Signed)
Pt is here today to follow up on HTN. Pt needs refills on medications.

## 2017-02-16 NOTE — Assessment & Plan Note (Signed)
Diet controlled Off metformin Continue diet

## 2017-02-16 NOTE — Progress Notes (Signed)
Subjective:  Patient ID: Wesley [name], male    DOB: November 20, 1956  Age: 61 y.o. MRN: [id number]  CC: Hypertension   HPI Wesley [name] hx of CVA, HTN, DM, hx of substance abuse, COPD,  Sick sinus syndrome s/p pacemaker, BPH he presents with his wife for   1. CHRONIC HYPERTENSION: compliant with regimen. Still with SOB this is chronic. No CP. Dizziness has improved.  2. Depression: worsening. Irritable, annoyed, worried. Taking Zoloft. Intermittent counseling at church. Denies suicidal ideation. Worried about lack of income. He has applied for disability. Started 3 years ago. Not approved.   3. BPH: taking flomax. Experienced improved urinary flow. Slight improvement in ED.   4. Diabetes: eating low carb diet. No exercise. Not taking metformin for the past 5 months. No subjective low CBGs.   Social History  Substance Use Topics  . Smoking status: Former Smoker    Packs/day: 1.00    Years: 40.00    Types: Cigarettes    Quit date: 08/06/2016  . Smokeless tobacco: Never Used     Comment: pt still smokes every now and again  . Alcohol use No     Comment: hx of ETOH abuse from 2001-2016     Outpatient Medications Prior to Visit  Medication Sig Dispense Refill  . albuterol (PROVENTIL HFA;VENTOLIN HFA) 108 (90 Base) MCG/ACT inhaler Inhale 2 puffs into the lungs every 6 (six) hours as needed for wheezing or shortness of breath. 1 Inhaler 2  . aspirin 325 MG tablet Take 325 mg by mouth daily.    Marland Kitchen atorvastatin (LIPITOR) 40 MG tablet Take 1 tablet (40 mg total) by mouth daily at 6 PM. 30 tablet 2  . Blood Glucose Monitoring Suppl (ACCU-CHEK AVIVA PLUS) w/Device KIT 1 Device by Does not apply route 4 (four) times daily. 1 kit 0  . cetirizine (ZYRTEC) 10 MG tablet Take 1 tablet (10 mg total) by mouth daily. 30 tablet 11  . Cholecalciferol (VITAMIN D3) 2000 UNITS TABS Take 2,000 Units by mouth daily. (Patient taking differently: Take 2,000 Units by mouth every morning. ) 30 tablet 11  .  Elastic Bandages & Supports (WRIST SPLINT/ELASTIC LEFT LG) MISC 1 each by Does not apply route daily. 1 each 0  . Elastic Bandages & Supports (WRIST SPLINT/ELASTIC RIGHT LG) MISC 1 each by Does not apply route at bedtime. 1 each 0  . fluticasone furoate-vilanterol (BREO ELLIPTA) 100-25 MCG/INH AEPB Inhale 1 puff into the lungs daily. 30 each 5  . glucose blood (ACCU-CHEK AVIVA) test strip Use as instructed 100 each 12  . lisinopril (PRINIVIL,ZESTRIL) 40 MG tablet Take 40 mg by mouth daily.   5  . meloxicam (MOBIC) 15 MG tablet TAKE 1 TABLET BY MOUTH DAILY. 30 tablet 0  . guaiFENesin (ROBITUSSIN) 100 MG/5ML SOLN Take 10 mLs (200 mg total) by mouth every 4 (four) hours as needed for cough or to loosen phlegm. (Patient not taking: Reported on 01/30/2017) 1200 mL 0  . metFORMIN (GLUCOPHAGE) 500 MG tablet Take 1 tablet (500 mg total) by mouth 2 (two) times daily with a meal. (Patient not taking: Reported on 02/16/2017) 60 tablet 1  . ondansetron (ZOFRAN) 8 MG tablet 1/2-1 as needed for nausea (Patient not taking: Reported on 02/16/2017) 30 tablet 0  . pantoprazole (PROTONIX) 40 MG tablet Take 1 tablet (40 mg total) by mouth daily. (Patient not taking: Reported on 02/16/2017) 30 tablet 5  . sertraline (ZOLOFT) 100 MG tablet Take 100 mg by mouth daily.    Marland Kitchen  tamsulosin (FLOMAX) 0.4 MG CAPS capsule 0.4 mg by mouth nightly after supper for two weeks, then 0.8 mg nightly (Patient taking differently: Take 0.4 mg by mouth at bedtime. ) 60 capsule 5   No facility-administered medications prior to visit.     ROS Review of Systems  Constitutional: Negative for chills, fatigue, fever and unexpected weight change.  HENT: Positive for hearing loss.   Eyes: Negative for visual disturbance.  Respiratory: Negative for cough and shortness of breath.   Cardiovascular: Negative for chest pain, palpitations and leg swelling.  Gastrointestinal: Negative for abdominal pain, blood in stool, constipation, diarrhea, nausea and  vomiting.  Endocrine: Negative for polydipsia, polyphagia and polyuria.  Genitourinary: Negative for dysuria.       Erectile dysfunction   Musculoskeletal: Negative for arthralgias, back pain, gait problem, myalgias and neck pain.  Skin: Negative for rash.  Allergic/Immunologic: Negative for immunocompromised state.  Neurological: Positive for tremors, light-headedness and numbness. Negative for dizziness, seizures, syncope, facial asymmetry, speech difficulty, weakness and headaches.  Hematological: Negative for adenopathy. Does not bruise/bleed easily.  Psychiatric/Behavioral: Positive for dysphoric mood. Negative for sleep disturbance and suicidal ideas. The patient is nervous/anxious.     Objective:  BP 127/87 (BP Location: Right Arm, Patient Position: Sitting, Cuff Size: Small)   Pulse 60   Temp 97.6 F (36.4 C) (Oral)   Ht '6\' 2"'  (1.88 m)   Wt 211 lb (95.7 kg)   SpO2 95%   BMI 27.09 kg/m   BP/Weight 02/16/2017 0/38/3338 02/13/9190  Systolic BP 660 600 459  Diastolic BP 87 977 92  Wt. (Lbs) 211 215.4 213.8  BMI 27.09 27.66 27.45   Physical Exam  Constitutional: He appears well-developed and well-nourished. No distress.  HENT:  Head: Normocephalic and atraumatic.  Neck: Normal range of motion. Neck supple.  Cardiovascular: Normal rate, regular rhythm, normal heart sounds and intact distal pulses.   Pulmonary/Chest: Effort normal and breath sounds normal.  Musculoskeletal: He exhibits no edema.  Neurological: He is alert.  Skin: Skin is warm and dry. No rash noted. No erythema.  Psychiatric: He has a normal mood and affect.   Lab Results  Component Value Date   HGBA1C 6.3 (H) 12/20/2016   CBG 132  Depression screen [facility] 2/9 02/16/2017 01/30/2017 01/19/2017  Decreased Interest '2 1 1  ' Down, Depressed, Hopeless '2 2 2  ' PHQ - 2 Score '4 3 3  ' Altered sleeping '1 2 3  ' Tired, decreased energy '2 2 2  ' Change in appetite 2 2 0  Feeling bad or failure about yourself  '2 1 2  ' Trouble  concentrating '2 2 2  ' Moving slowly or fidgety/restless '2 2 2  ' Suicidal thoughts '1 1 1  ' PHQ-9 Score '16 15 15  ' Some recent data might be hidden   GAD 7 : Generalized Anxiety Score 02/16/2017 01/30/2017 01/19/2017 09/23/2016  Nervous, Anxious, on Edge '2 2 2 1  ' Control/stop worrying '2 2 2 2  ' Worry too much - different things '2 2 2 2  ' Trouble relaxing '1 2 2 2  ' Restless '2 2 2 ' -  Easily annoyed or irritable '3 2 2 1  ' Afraid - awful might happen '1 1 1 1  ' Total GAD 7 Score '13 13 13 ' -     Assessment & Plan:   Yishai was seen today for hypertension.  Diagnoses and all orders for this visit:  Controlled type 2 diabetes mellitus without complication, without long-term current use of insulin ([place]) -  POCT glucose (manual entry)  Sensorineural hearing loss (SNHL) of both ears -     Ambulatory referral to ENT  Benign prostatic hyperplasia with urinary retention -     tamsulosin (FLOMAX) 0.4 MG CAPS capsule; Take 2 capsules (0.8 mg total) by mouth daily after supper. 0.4 mg by mouth nightly after supper for two weeks, then 0.8 mg nightly  Anxiety and depression -     escitalopram (LEXAPRO) 20 MG tablet; Take 1 tablet (20 mg total) by mouth daily. -     Ambulatory referral to Psychology  Pure hypercholesterolemia -     atorvastatin (LIPITOR) 40 MG tablet; Take 1 tablet (40 mg total) by mouth daily at 6 PM.  Essential hypertension -     lisinopril (PRINIVIL,ZESTRIL) 40 MG tablet; Take 1 tablet (40 mg total) by mouth daily.    No orders of the defined types were placed in this encounter.   Follow-up: No Follow-up on file.   Boykin Nearing MD

## 2017-02-16 NOTE — Patient Instructions (Addendum)
Wesley [name] was seen today for hypertension.  Diagnoses and all orders for this visit:  Controlled type 2 diabetes mellitus without complication, without long-term current use of insulin (HCC) -     POCT glucose (manual entry)  Sensorineural hearing loss (SNHL) of both ears -     Ambulatory referral to ENT  Benign prostatic hyperplasia with urinary retention -     tamsulosin (FLOMAX) 0.4 MG CAPS capsule; Take 2 capsules (0.8 mg total) by mouth daily after supper. 0.4 mg by mouth nightly after supper for two weeks, then 0.8 mg nightly  Anxiety and depression -     escitalopram (LEXAPRO) 20 MG tablet; Take 1 tablet (20 mg total) by mouth daily. -     Ambulatory referral to Psychology  Pure hypercholesterolemia -     atorvastatin (LIPITOR) 40 MG tablet; Take 1 tablet (40 mg total) by mouth daily at 6 PM.  Essential hypertension -     lisinopril (PRINIVIL,ZESTRIL) 40 MG tablet; Take 1 tablet (40 mg total) by mouth daily.   Stop zoloft Replace with lexapro   Referrals to ENT and psychology placed I will mail a summary of medical disability to your home  F/u in 6 weeks for depression  Dr. Adrian Blackwater

## 2017-02-26 ENCOUNTER — Encounter: Payer: Self-pay | Admitting: Nurse Practitioner

## 2017-03-03 ENCOUNTER — Encounter: Payer: Self-pay | Admitting: Family Medicine

## 2017-03-19 ENCOUNTER — Telehealth: Payer: Self-pay | Admitting: Family Medicine

## 2017-03-19 NOTE — Telephone Encounter (Signed)
Anne from [facility] calling stating they have not been able to follow through with telemonitoring orders due to not being able to contact the pt  States they will follow up with the patient if he contacts them

## 2017-03-23 NOTE — Telephone Encounter (Signed)
Will route to PCP 

## 2017-03-23 NOTE — Telephone Encounter (Signed)
Noted  

## 2017-03-26 ENCOUNTER — Encounter: Payer: Self-pay | Admitting: Pulmonary Disease

## 2017-03-26 ENCOUNTER — Ambulatory Visit (INDEPENDENT_AMBULATORY_CARE_PROVIDER_SITE_OTHER): Payer: Medicaid Other | Admitting: Pulmonary Disease

## 2017-03-26 VITALS — BP 108/70 | HR 60 | Ht 74.0 in | Wt 209.2 lb

## 2017-03-26 DIAGNOSIS — J449 Chronic obstructive pulmonary disease, unspecified: Secondary | ICD-10-CM | POA: Diagnosis not present

## 2017-03-26 DIAGNOSIS — G4733 Obstructive sleep apnea (adult) (pediatric): Secondary | ICD-10-CM

## 2017-03-26 DIAGNOSIS — J432 Centrilobular emphysema: Secondary | ICD-10-CM

## 2017-03-26 NOTE — Patient Instructions (Signed)
Lab test today Follow up in 6 months 

## 2017-03-26 NOTE — Progress Notes (Signed)
Current Outpatient Prescriptions on File Prior to Visit  Medication Sig  . albuterol (PROVENTIL HFA;VENTOLIN HFA) 108 (90 Base) MCG/ACT inhaler Inhale 2 puffs into the lungs every 6 (six) hours as needed for wheezing or shortness of breath.  Marland Kitchen aspirin 325 MG tablet Take 325 mg by mouth daily.  Marland Kitchen atorvastatin (LIPITOR) 40 MG tablet Take 1 tablet (40 mg total) by mouth daily at 6 PM.  . Blood Glucose Monitoring Suppl (ACCU-CHEK AVIVA PLUS) w/Device KIT 1 Device by Does not apply route 4 (four) times daily.  . cetirizine (ZYRTEC) 10 MG tablet Take 1 tablet (10 mg total) by mouth daily.  . Cholecalciferol (VITAMIN D3) 2000 UNITS TABS Take 2,000 Units by mouth daily. (Patient taking differently: Take 2,000 Units by mouth every morning. )  . Elastic Bandages & Supports (WRIST SPLINT/ELASTIC LEFT LG) MISC 1 each by Does not apply route daily.  . Elastic Bandages & Supports (WRIST SPLINT/ELASTIC RIGHT LG) MISC 1 each by Does not apply route at bedtime.  Marland Kitchen escitalopram (LEXAPRO) 20 MG tablet Take 1 tablet (20 mg total) by mouth daily.  . fluticasone furoate-vilanterol (BREO ELLIPTA) 100-25 MCG/INH AEPB Inhale 1 puff into the lungs daily.  Marland Kitchen glucose blood (ACCU-CHEK AVIVA) test strip Use as instructed  . lisinopril (PRINIVIL,ZESTRIL) 40 MG tablet Take 1 tablet (40 mg total) by mouth daily.  . meloxicam (MOBIC) 15 MG tablet TAKE 1 TABLET BY MOUTH DAILY.  . pantoprazole (PROTONIX) 40 MG tablet Take 1 tablet (40 mg total) by mouth daily.  . tamsulosin (FLOMAX) 0.4 MG CAPS capsule Take 2 capsules (0.8 mg total) by mouth daily after supper. 0.4 mg by mouth nightly after supper for two weeks, then 0.8 mg nightly   No current facility-administered medications on file prior to visit.     Chief Complaint  Patient presents with  . Follow-up    Pt wears CPAP nightly. about 3-4 hours.Pt c/o slight problem with mask/pressure>> air is leaking into his eyes. DME: [facility]    Pulmonary tests PFT 04/17/16 >> FEV1 2.24  (61%), FEV1% 59, TLC 6.73 (86%), DLCO 46%, +BD  Sleep tests PSG 08/15/16 >> AHI 27, SpO2 low 83% CPAP 02/24/17 to 03/25/17 >> used on 18 of 30 nights with average 4 hrs 32 min.  Average AHI 2.9 with CPAP 10 cm H2O  Cardiac tests Echo 08/30/16 >> EF 60 to 65%  Past medical history He  has a past medical history of Arthritis; Blood transfusion without reported diagnosis; BPH (benign prostatic hyperplasia); Cataract; Chronic insomnia (03/31/2016); COPD (chronic obstructive pulmonary disease) ([place]); Hyperlipidemia; Hypertension; Left-sided weakness (08/28/2016); Myocardial infarction; Pacemaker; Shortness of breath; Stroke ([place]); Substance abuse; Tremor, essential (03/31/2016); Tremors of nervous system; and Tuberculosis.  Past surgical history, Family history, Social history, Allergies reviewed  Vital signs BP 108/70 (BP Location: Right Arm, Cuff Size: Normal)   Pulse 60   Ht _0  (1.88 m)   Wt 209 lb 3.2 oz (94.9 kg)   SpO2 97%   BMI 26.86 kg/m   History of Present Illness Wesley [name] is a 61 y.o. male smoker for evaluation of obstructive sleep apnea and COPD with emphysema.  Since last visit he had titration study.  Started CPAP 10.  Uses full face mask.  Occasional nasal congestion.  Feels sleeping better.  Feels like breo helps.  Still smokes 1 or 2 cigarettes per week.  Not as much cough, wheeze.  He is starting to walk 3 times per week with his wife.  Physical  Exam:  General - pleasant ENT - no sinus tenderness, poor dentition, no LAN Cardiac - regular, no murmur Chest - no wheeze, rales Back - no tenderness Abd - soft, non tender Ext - no edema Neuro - normal strength Skin - no rashes Psych - normal mood   Assessment/plan:  Obstructive sleep apnea. - he reports benefit from CPAP - continue CPAP 10 cm H2O  COPD with emphysema and bronchodilator responsiveness. - continue breo, and prn albuterol - will check A1AT - he received vaccinations from PCP - he will  discuss with his wife about pulmonary rehab  Tobacco abuse. - emphasized absolute need to not smoke cigarettes at all   Patient Instructions  Lab test today  Follow up in 6 months    Chesley Mires, M.D. Pager [phone] 03/26/2017, 11:36 AM

## 2017-03-30 ENCOUNTER — Ambulatory Visit: Payer: Medicaid Other | Attending: Family Medicine | Admitting: Family Medicine

## 2017-03-30 ENCOUNTER — Encounter: Payer: Self-pay | Admitting: Family Medicine

## 2017-03-30 VITALS — BP 122/88 | HR 80 | Temp 97.6°F

## 2017-03-30 DIAGNOSIS — F329 Major depressive disorder, single episode, unspecified: Secondary | ICD-10-CM | POA: Insufficient documentation

## 2017-03-30 DIAGNOSIS — M25571 Pain in right ankle and joints of right foot: Secondary | ICD-10-CM | POA: Diagnosis not present

## 2017-03-30 DIAGNOSIS — N400 Enlarged prostate without lower urinary tract symptoms: Secondary | ICD-10-CM | POA: Diagnosis not present

## 2017-03-30 DIAGNOSIS — F32A Depression, unspecified: Secondary | ICD-10-CM

## 2017-03-30 DIAGNOSIS — I495 Sick sinus syndrome: Secondary | ICD-10-CM | POA: Diagnosis not present

## 2017-03-30 DIAGNOSIS — Z7982 Long term (current) use of aspirin: Secondary | ICD-10-CM | POA: Insufficient documentation

## 2017-03-30 DIAGNOSIS — M25562 Pain in left knee: Secondary | ICD-10-CM | POA: Diagnosis not present

## 2017-03-30 DIAGNOSIS — E119 Type 2 diabetes mellitus without complications: Secondary | ICD-10-CM | POA: Diagnosis not present

## 2017-03-30 DIAGNOSIS — J449 Chronic obstructive pulmonary disease, unspecified: Secondary | ICD-10-CM | POA: Insufficient documentation

## 2017-03-30 DIAGNOSIS — Z8673 Personal history of transient ischemic attack (TIA), and cerebral infarction without residual deficits: Secondary | ICD-10-CM | POA: Insufficient documentation

## 2017-03-30 DIAGNOSIS — G8929 Other chronic pain: Secondary | ICD-10-CM | POA: Diagnosis not present

## 2017-03-30 DIAGNOSIS — F419 Anxiety disorder, unspecified: Secondary | ICD-10-CM | POA: Diagnosis not present

## 2017-03-30 DIAGNOSIS — M1990 Unspecified osteoarthritis, unspecified site: Secondary | ICD-10-CM | POA: Diagnosis not present

## 2017-03-30 DIAGNOSIS — I10 Essential (primary) hypertension: Secondary | ICD-10-CM | POA: Insufficient documentation

## 2017-03-30 DIAGNOSIS — Z23 Encounter for immunization: Secondary | ICD-10-CM | POA: Diagnosis not present

## 2017-03-30 DIAGNOSIS — Z950 Presence of cardiac pacemaker: Secondary | ICD-10-CM | POA: Insufficient documentation

## 2017-03-30 DIAGNOSIS — Z79899 Other long term (current) drug therapy: Secondary | ICD-10-CM | POA: Diagnosis not present

## 2017-03-30 DIAGNOSIS — Z87891 Personal history of nicotine dependence: Secondary | ICD-10-CM | POA: Diagnosis not present

## 2017-03-30 DIAGNOSIS — M150 Primary generalized (osteo)arthritis: Secondary | ICD-10-CM

## 2017-03-30 DIAGNOSIS — M159 Polyosteoarthritis, unspecified: Secondary | ICD-10-CM

## 2017-03-30 LAB — POCT GLYCOSYLATED HEMOGLOBIN (HGB A1C): HEMOGLOBIN A1C: 6.8

## 2017-03-30 LAB — GLUCOSE, POCT (MANUAL RESULT ENTRY): POC Glucose: 146 mg/dl — AB (ref 70–99)

## 2017-03-30 MED ORDER — MELOXICAM 15 MG PO TABS: 15.0000 mg | ORAL_TABLET | Freq: Every day | ORAL | 3 refills | Status: DC

## 2017-03-30 MED FILL — MELOXICAM 15 MG TABLET: 15 | 30 days supply | Qty: 30 | Fill #0

## 2017-03-30 NOTE — Assessment & Plan Note (Signed)
A; anxiety and depression, improved with lexapro P: Continue lexapro 20 mg daily Continue counseling

## 2017-03-30 NOTE — Progress Notes (Signed)
Subjective:  Patient ID: Wesley [name], male    DOB: November 27, 1956  Age: 61 y.o. MRN: [id number]  CC: Depression   HPI Wesley [name] hx of CVA, HTN, DM, hx of substance abuse, COPD,  Sick sinus syndrome s/p pacemaker, BPH he presents with his wife for   1. Depression: improved. He reports less irratability, annoyed, worried. Taking lexapro 20 mg daily.  Intermittent counseling at church. Denies suicidal ideation. Worried about lack of income. Sleeping has improved with CPAP.   2. R ankle pain: anterior pain. Intermittent swelling. He has know OA. No recent or remote injury. Pain comes and goes. He wears a brace as needed. He takes mobic as needed and it helps.  Social History  Substance Use Topics  . Smoking status: Former Smoker    Packs/day: 1.00    Years: 40.00    Types: Cigarettes    Quit date: 08/06/2016  . Smokeless tobacco: Never Used     Comment: pt still smokes every now and again  . Alcohol use No     Comment: hx of ETOH abuse from 2001-2016     Outpatient Medications Prior to Visit  Medication Sig Dispense Refill  . albuterol (PROVENTIL HFA;VENTOLIN HFA) 108 (90 Base) MCG/ACT inhaler Inhale 2 puffs into the lungs every 6 (six) hours as needed for wheezing or shortness of breath. 1 Inhaler 2  . aspirin 325 MG tablet Take 325 mg by mouth daily.    Marland Kitchen atorvastatin (LIPITOR) 40 MG tablet Take 1 tablet (40 mg total) by mouth daily at 6 PM. 30 tablet 11  . Blood Glucose Monitoring Suppl (ACCU-CHEK AVIVA PLUS) w/Device KIT 1 Device by Does not apply route 4 (four) times daily. 1 kit 0  . cetirizine (ZYRTEC) 10 MG tablet Take 1 tablet (10 mg total) by mouth daily. 30 tablet 11  . Cholecalciferol (VITAMIN D3) 2000 UNITS TABS Take 2,000 Units by mouth daily. (Patient taking differently: Take 2,000 Units by mouth every morning. ) 30 tablet 11  . Elastic Bandages & Supports (WRIST SPLINT/ELASTIC LEFT LG) MISC 1 each by Does not apply route daily. 1 each 0  . Elastic Bandages &  Supports (WRIST SPLINT/ELASTIC RIGHT LG) MISC 1 each by Does not apply route at bedtime. 1 each 0  . escitalopram (LEXAPRO) 20 MG tablet Take 1 tablet (20 mg total) by mouth daily. 30 tablet 5  . fluticasone furoate-vilanterol (BREO ELLIPTA) 100-25 MCG/INH AEPB Inhale 1 puff into the lungs daily. 30 each 5  . glucose blood (ACCU-CHEK AVIVA) test strip Use as instructed 100 each 12  . meloxicam (MOBIC) 15 MG tablet TAKE 1 TABLET BY MOUTH DAILY. 30 tablet 0  . pantoprazole (PROTONIX) 40 MG tablet Take 1 tablet (40 mg total) by mouth daily. 30 tablet 5  . lisinopril (PRINIVIL,ZESTRIL) 40 MG tablet Take 1 tablet (40 mg total) by mouth daily. (Patient not taking: Reported on 03/30/2017) 30 tablet 11  . tamsulosin (FLOMAX) 0.4 MG CAPS capsule Take 2 capsules (0.8 mg total) by mouth daily after supper. 0.4 mg by mouth nightly after supper for two weeks, then 0.8 mg nightly (Patient not taking: Reported on 03/30/2017) 60 capsule 5   No facility-administered medications prior to visit.     ROS Review of Systems  Constitutional: Negative for chills, fatigue, fever and unexpected weight change.  HENT: Positive for hearing loss.   Eyes: Negative for visual disturbance.  Respiratory: Negative for cough and shortness of breath.   Cardiovascular: Negative for chest  pain, palpitations and leg swelling.  Gastrointestinal: Negative for abdominal pain, blood in stool, constipation, diarrhea, nausea and vomiting.  Endocrine: Negative for polydipsia, polyphagia and polyuria.  Genitourinary: Negative for dysuria.       Erectile dysfunction   Musculoskeletal: Positive for arthralgias. Negative for back pain, gait problem, myalgias and neck pain.  Skin: Negative for rash.  Allergic/Immunologic: Negative for immunocompromised state.  Neurological: Positive for tremors, light-headedness and numbness. Negative for dizziness, seizures, syncope, facial asymmetry, speech difficulty, weakness and headaches.    Hematological: Negative for adenopathy. Does not bruise/bleed easily.  Psychiatric/Behavioral: Positive for dysphoric mood. Negative for sleep disturbance and suicidal ideas. The patient is nervous/anxious.     Objective:  BP 122/88   Pulse 80   Temp 97.6 F (36.4 C) (Oral)   SpO2 96%   BP/Weight 03/30/2017 02/19/6577 03/20/9628  Systolic BP 528 413 244  Diastolic BP 88 70 87  Wt. (Lbs) - 209.2 211  BMI - 26.86 27.09   Physical Exam  Constitutional: He appears well-developed and well-nourished. No distress.  HENT:  Head: Normocephalic and atraumatic.  Neck: Normal range of motion. Neck supple.  Cardiovascular: Normal rate, regular rhythm, normal heart sounds and intact distal pulses.   Pulmonary/Chest: Effort normal and breath sounds normal.  Musculoskeletal: He exhibits no edema.       Right ankle: He exhibits ecchymosis and deformity. He exhibits normal range of motion, no swelling, no laceration and normal pulse. No tenderness.  Neurological: He is alert.  Skin: Skin is warm and dry. No rash noted. No erythema.  Psychiatric: He has a normal mood and affect.   Lab Results  Component Value Date   HGBA1C 6.3 (H) 12/20/2016   Lab Results  Component Value Date   HGBA1C 6.8 03/30/2017    CBG 146   Depression screen [facility] 2/9 03/30/2017 02/16/2017 01/30/2017  Decreased Interest '2 2 1  ' Down, Depressed, Hopeless '2 2 2  ' PHQ - 2 Score '4 4 3  ' Altered sleeping '2 1 2  ' Tired, decreased energy '2 2 2  ' Change in appetite '2 2 2  ' Feeling bad or failure about yourself  '1 2 1  ' Trouble concentrating '2 2 2  ' Moving slowly or fidgety/restless '1 2 2  ' Suicidal thoughts 0 1 1  PHQ-9 Score '14 16 15  ' Some recent data might be hidden   GAD 7 : Generalized Anxiety Score 03/30/2017 02/16/2017 01/30/2017 01/19/2017  Nervous, Anxious, on Edge '3 2 2 2  ' Control/stop worrying '1 2 2 2  ' Worry too much - different things '2 2 2 2  ' Trouble relaxing '2 1 2 2  ' Restless '1 2 2 2  ' Easily annoyed or irritable '3 3 2 2   ' Afraid - awful might happen '2 1 1 1  ' Total GAD 7 Score '14 13 13 13     ' Assessment & Plan:   Neils was seen today for depression.  Diagnoses and all orders for this visit:  Controlled type 2 diabetes mellitus without complication, without long-term current use of insulin ([facility]) -     Glucose (CBG) -     HgB A1c  Anxiety and depression  Chronic pain of left knee -     meloxicam (MOBIC) 15 MG tablet; Take 1 tablet (15 mg total) by mouth daily.  Chronic pain of right ankle -     meloxicam (MOBIC) 15 MG tablet; Take 1 tablet (15 mg total) by mouth daily.  Need for 23-polyvalent pneumococcal polysaccharide vaccine -  Pneumococcal polysaccharide vaccine 23-valent greater than or equal to 2yo subcutaneous/IM  Vaccine for diphtheria-tetanus-pertussis, combined -     Tdap vaccine greater than or equal to 7yo IM    No orders of the defined types were placed in this encounter.   Follow-up: Return in about 3 months (around 06/29/2017) for HTN, diabetes and anxiety and depression .   Boykin Nearing MD

## 2017-03-30 NOTE — Assessment & Plan Note (Signed)
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

## 2017-03-30 NOTE — Patient Instructions (Addendum)
Wesley [name] was seen today for depression.  Diagnoses and all orders for this visit:  Controlled type 2 diabetes mellitus without complication, without long-term current use of insulin (HCC) -     Glucose (CBG) -     HgB A1c  Anxiety and depression  Chronic pain of left knee -     meloxicam (MOBIC) 15 MG tablet; Take 1 tablet (15 mg total) by mouth daily.  Chronic pain of right ankle -     meloxicam (MOBIC) 15 MG tablet; Take 1 tablet (15 mg total) by mouth daily.  your diabetes remains well controlled without medication to lower sugars Keep up exercise Keep up low sugar diet   f/u in 3 months for HTN, diabetes, anxiety and depression   Dr. Adrian Blackwater

## 2017-04-29 ENCOUNTER — Encounter: Payer: Self-pay | Admitting: Family Medicine

## 2017-05-19 ENCOUNTER — Emergency Department (HOSPITAL_COMMUNITY): Payer: Medicaid Other

## 2017-05-19 ENCOUNTER — Encounter (HOSPITAL_COMMUNITY): Payer: Self-pay | Admitting: Emergency Medicine

## 2017-05-19 ENCOUNTER — Inpatient Hospital Stay (HOSPITAL_COMMUNITY)
Admission: EM | Admit: 2017-05-19 | Discharge: 2017-05-23 | DRG: 070 | Disposition: A | Discharge status: Home / Self Care | Payer: Medicaid Other | Attending: Internal Medicine | Admitting: Internal Medicine

## 2017-05-19 DIAGNOSIS — Z880 Allergy status to penicillin: Secondary | ICD-10-CM

## 2017-05-19 DIAGNOSIS — N401 Enlarged prostate with lower urinary tract symptoms: Secondary | ICD-10-CM

## 2017-05-19 DIAGNOSIS — R05 Cough: Secondary | ICD-10-CM

## 2017-05-19 DIAGNOSIS — E119 Type 2 diabetes mellitus without complications: Secondary | ICD-10-CM | POA: Diagnosis present

## 2017-05-19 DIAGNOSIS — E785 Hyperlipidemia, unspecified: Secondary | ICD-10-CM | POA: Diagnosis present

## 2017-05-19 DIAGNOSIS — I10 Essential (primary) hypertension: Secondary | ICD-10-CM | POA: Diagnosis present

## 2017-05-19 DIAGNOSIS — E7800 Pure hypercholesterolemia, unspecified: Secondary | ICD-10-CM

## 2017-05-19 DIAGNOSIS — Z87891 Personal history of nicotine dependence: Secondary | ICD-10-CM

## 2017-05-19 DIAGNOSIS — G9340 Encephalopathy, unspecified: Secondary | ICD-10-CM | POA: Diagnosis present

## 2017-05-19 DIAGNOSIS — R5383 Other fatigue: Secondary | ICD-10-CM

## 2017-05-19 DIAGNOSIS — R55 Syncope and collapse: Secondary | ICD-10-CM | POA: Diagnosis present

## 2017-05-19 DIAGNOSIS — I495 Sick sinus syndrome: Secondary | ICD-10-CM | POA: Diagnosis present

## 2017-05-19 DIAGNOSIS — G9349 Other encephalopathy: Principal | ICD-10-CM | POA: Diagnosis present

## 2017-05-19 DIAGNOSIS — N400 Enlarged prostate without lower urinary tract symptoms: Secondary | ICD-10-CM | POA: Diagnosis present

## 2017-05-19 DIAGNOSIS — I69354 Hemiplegia and hemiparesis following cerebral infarction affecting left non-dominant side: Secondary | ICD-10-CM

## 2017-05-19 DIAGNOSIS — R4182 Altered mental status, unspecified: Secondary | ICD-10-CM

## 2017-05-19 DIAGNOSIS — J449 Chronic obstructive pulmonary disease, unspecified: Secondary | ICD-10-CM | POA: Diagnosis present

## 2017-05-19 DIAGNOSIS — Z79899 Other long term (current) drug therapy: Secondary | ICD-10-CM

## 2017-05-19 DIAGNOSIS — R059 Cough, unspecified: Secondary | ICD-10-CM

## 2017-05-19 DIAGNOSIS — G4733 Obstructive sleep apnea (adult) (pediatric): Secondary | ICD-10-CM | POA: Diagnosis present

## 2017-05-19 DIAGNOSIS — L299 Pruritus, unspecified: Secondary | ICD-10-CM

## 2017-05-19 DIAGNOSIS — G459 Transient cerebral ischemic attack, unspecified: Secondary | ICD-10-CM

## 2017-05-19 DIAGNOSIS — J189 Pneumonia, unspecified organism: Secondary | ICD-10-CM | POA: Diagnosis present

## 2017-05-19 DIAGNOSIS — R338 Other retention of urine: Secondary | ICD-10-CM

## 2017-05-19 DIAGNOSIS — J440 Chronic obstructive pulmonary disease with acute lower respiratory infection: Secondary | ICD-10-CM | POA: Diagnosis present

## 2017-05-19 DIAGNOSIS — G250 Essential tremor: Secondary | ICD-10-CM | POA: Diagnosis present

## 2017-05-19 DIAGNOSIS — I252 Old myocardial infarction: Secondary | ICD-10-CM

## 2017-05-19 DIAGNOSIS — Z950 Presence of cardiac pacemaker: Secondary | ICD-10-CM

## 2017-05-19 DIAGNOSIS — I959 Hypotension, unspecified: Secondary | ICD-10-CM | POA: Diagnosis present

## 2017-05-19 LAB — RAPID URINE DRUG SCREEN, HOSP PERFORMED
Amphetamines: NOT DETECTED
BARBITURATES: NOT DETECTED
BENZODIAZEPINES: NOT DETECTED
COCAINE: NOT DETECTED
OPIATES: NOT DETECTED
TETRAHYDROCANNABINOL: NOT DETECTED

## 2017-05-19 LAB — DIFFERENTIAL
BASOS PCT: 0 %
Basophils Absolute: 0 10*3/uL (ref 0.0–0.1)
EOS ABS: 0 10*3/uL (ref 0.0–0.7)
EOS PCT: 1 %
Lymphocytes Relative: 22 %
Lymphs Abs: 1 10*3/uL (ref 0.7–4.0)
Monocytes Absolute: 0.7 10*3/uL (ref 0.1–1.0)
Monocytes Relative: 16 %
NEUTROS PCT: 61 %
Neutro Abs: 2.8 10*3/uL (ref 1.7–7.7)

## 2017-05-19 LAB — I-STAT CHEM 8, ED
BUN: 17 mg/dL (ref 6–20)
CREATININE: 1 mg/dL (ref 0.61–1.24)
Calcium, Ion: 1.12 mmol/L — ABNORMAL LOW (ref 1.15–1.40)
Chloride: 101 mmol/L (ref 101–111)
Glucose, Bld: 108 mg/dL — ABNORMAL HIGH (ref 65–99)
HEMATOCRIT: 46 % (ref 39.0–52.0)
HEMOGLOBIN: 15.6 g/dL (ref 13.0–17.0)
POTASSIUM: 3.3 mmol/L — AB (ref 3.5–5.1)
SODIUM: 139 mmol/L (ref 135–145)
TCO2: 26 mmol/L (ref 0–100)

## 2017-05-19 LAB — COMPREHENSIVE METABOLIC PANEL
ALBUMIN: 4.3 g/dL (ref 3.5–5.0)
ALT: 36 U/L (ref 17–63)
ANION GAP: 8 (ref 5–15)
AST: 24 U/L (ref 15–41)
Alkaline Phosphatase: 104 U/L (ref 38–126)
BUN: 16 mg/dL (ref 6–20)
CHLORIDE: 104 mmol/L (ref 101–111)
CO2: 26 mmol/L (ref 22–32)
Calcium: 9.2 mg/dL (ref 8.9–10.3)
Creatinine, Ser: 0.91 mg/dL (ref 0.61–1.24)
GFR calc Af Amer: >60 mL/min (ref >60)
GFR calc non Af Amer: >60 mL/min (ref >60)
GLUCOSE: 113 mg/dL — AB (ref 65–99)
POTASSIUM: 3.4 mmol/L — AB (ref 3.5–5.1)
Sodium: 138 mmol/L (ref 135–145)
Total Bilirubin: 0.2 mg/dL — ABNORMAL LOW (ref 0.3–1.2)
Total Protein: 7.4 g/dL (ref 6.5–8.1)

## 2017-05-19 LAB — CBC
HCT: 43 % (ref 39.0–52.0)
Hemoglobin: 14.3 g/dL (ref 13.0–17.0)
MCH: 27.3 pg (ref 26.0–34.0)
MCHC: 33.3 g/dL (ref 30.0–36.0)
MCV: 82.2 fL (ref 78.0–100.0)
PLATELETS: 137 10*3/uL — AB (ref 150–400)
RBC: 5.23 MIL/uL (ref 4.22–5.81)
RDW: 14.8 % (ref 11.5–15.5)
WBC: 4.6 10*3/uL (ref 4.0–10.5)

## 2017-05-19 LAB — URINALYSIS, ROUTINE W REFLEX MICROSCOPIC
Bilirubin Urine: NEGATIVE
GLUCOSE, UA: NEGATIVE mg/dL
HGB URINE DIPSTICK: NEGATIVE
Ketones, ur: NEGATIVE mg/dL
LEUKOCYTES UA: NEGATIVE
Nitrite: NEGATIVE
PH: 6 (ref 5.0–8.0)
PROTEIN: NEGATIVE mg/dL
Specific Gravity, Urine: 1.016 (ref 1.005–1.030)

## 2017-05-19 LAB — I-STAT TROPONIN, ED: Troponin i, poc: 0 ng/mL (ref 0.00–0.08)

## 2017-05-19 LAB — PROTIME-INR
INR: 1.03
PROTHROMBIN TIME: 13.6 s (ref 11.4–15.2)

## 2017-05-19 LAB — ETHANOL

## 2017-05-19 LAB — APTT: aPTT: 28 seconds (ref 24–36)

## 2017-05-19 MED ORDER — FENTANYL CITRATE (PF) 100 MCG/2ML IJ SOLN
50.0000 ug | Freq: Once | INTRAMUSCULAR | Status: AC
  Administered 2017-05-19: 50 ug via INTRAVENOUS
  Filled 2017-05-19: qty 2

## 2017-05-19 MED ORDER — SODIUM CHLORIDE 0.9 % IV SOLN
INTRAVENOUS | Status: DC
  Administered 2017-05-19 – 2017-05-23 (×6): via INTRAVENOUS

## 2017-05-19 MED ORDER — LORAZEPAM 2 MG/ML IJ SOLN
1.0000 mg | Freq: Once | INTRAMUSCULAR | Status: AC
  Administered 2017-05-19: 1 mg via INTRAVENOUS
  Filled 2017-05-19: qty 1

## 2017-05-19 NOTE — ED Triage Notes (Signed)
Patient c/o generalized weakness and headache x3 days. Family at bedside reports patient has had two syncopal episodes in the past 3 days. Hx of stroke.
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
## 2017-05-19 NOTE — ED Notes (Signed)
ED Provider at bedside. 

## 2017-05-19 NOTE — ED Notes (Signed)
EDP notified of pt status and was into see pt and family/wife

## 2017-05-20 ENCOUNTER — Encounter (HOSPITAL_COMMUNITY): Payer: Self-pay | Admitting: Internal Medicine

## 2017-05-20 ENCOUNTER — Observation Stay (HOSPITAL_BASED_OUTPATIENT_CLINIC_OR_DEPARTMENT_OTHER)
Admit: 2017-05-20 | Discharge: 2017-05-20 | Disposition: A | Discharge status: Home / Self Care | Payer: Medicaid Other | Attending: Internal Medicine | Admitting: Internal Medicine

## 2017-05-20 DIAGNOSIS — E785 Hyperlipidemia, unspecified: Secondary | ICD-10-CM | POA: Diagnosis present

## 2017-05-20 DIAGNOSIS — R55 Syncope and collapse: Secondary | ICD-10-CM | POA: Diagnosis present

## 2017-05-20 DIAGNOSIS — R4182 Altered mental status, unspecified: Secondary | ICD-10-CM

## 2017-05-20 DIAGNOSIS — N400 Enlarged prostate without lower urinary tract symptoms: Secondary | ICD-10-CM | POA: Diagnosis present

## 2017-05-20 DIAGNOSIS — I69354 Hemiplegia and hemiparesis following cerebral infarction affecting left non-dominant side: Secondary | ICD-10-CM | POA: Diagnosis not present

## 2017-05-20 DIAGNOSIS — Z87891 Personal history of nicotine dependence: Secondary | ICD-10-CM | POA: Diagnosis not present

## 2017-05-20 DIAGNOSIS — I10 Essential (primary) hypertension: Secondary | ICD-10-CM

## 2017-05-20 DIAGNOSIS — G9349 Other encephalopathy: Secondary | ICD-10-CM | POA: Diagnosis not present

## 2017-05-20 DIAGNOSIS — I959 Hypotension, unspecified: Secondary | ICD-10-CM | POA: Diagnosis present

## 2017-05-20 DIAGNOSIS — G9340 Encephalopathy, unspecified: Secondary | ICD-10-CM

## 2017-05-20 DIAGNOSIS — G4733 Obstructive sleep apnea (adult) (pediatric): Secondary | ICD-10-CM | POA: Diagnosis present

## 2017-05-20 DIAGNOSIS — E119 Type 2 diabetes mellitus without complications: Secondary | ICD-10-CM | POA: Diagnosis present

## 2017-05-20 DIAGNOSIS — G250 Essential tremor: Secondary | ICD-10-CM | POA: Diagnosis present

## 2017-05-20 DIAGNOSIS — J440 Chronic obstructive pulmonary disease with acute lower respiratory infection: Secondary | ICD-10-CM | POA: Diagnosis present

## 2017-05-20 DIAGNOSIS — Z880 Allergy status to penicillin: Secondary | ICD-10-CM | POA: Diagnosis not present

## 2017-05-20 DIAGNOSIS — Z79899 Other long term (current) drug therapy: Secondary | ICD-10-CM | POA: Diagnosis not present

## 2017-05-20 DIAGNOSIS — Z950 Presence of cardiac pacemaker: Secondary | ICD-10-CM | POA: Diagnosis not present

## 2017-05-20 DIAGNOSIS — J189 Pneumonia, unspecified organism: Secondary | ICD-10-CM | POA: Diagnosis present

## 2017-05-20 DIAGNOSIS — I252 Old myocardial infarction: Secondary | ICD-10-CM | POA: Diagnosis not present

## 2017-05-20 LAB — RPR: RPR Ser Ql: NONREACTIVE

## 2017-05-20 LAB — BLOOD GAS, ARTERIAL
ACID-BASE DEFICIT: 0.6 mmol/L (ref 0.0–2.0)
Bicarbonate: 23.4 mmol/L (ref 20.0–28.0)
DRAWN BY: 11249
FIO2: 0.21
O2 SAT: 92.6 %
Patient temperature: 100.1
pCO2 arterial: 39.9 mmHg (ref 32.0–48.0)
pH, Arterial: 7.391 (ref 7.350–7.450)
pO2, Arterial: 70.9 mmHg — ABNORMAL LOW (ref 83.0–108.0)

## 2017-05-20 LAB — GLUCOSE, CAPILLARY
GLUCOSE-CAPILLARY: 108 mg/dL — AB (ref 65–99)
Glucose-Capillary: 145 mg/dL — ABNORMAL HIGH (ref 65–99)
Glucose-Capillary: 123 mg/dL — ABNORMAL HIGH (ref 65–99)
Glucose-Capillary: 179 mg/dL — ABNORMAL HIGH (ref 65–99)

## 2017-05-20 LAB — BASIC METABOLIC PANEL
ANION GAP: 6 (ref 5–15)
BUN: 14 mg/dL (ref 6–20)
CO2: 25 mmol/L (ref 22–32)
Calcium: 8.7 mg/dL — ABNORMAL LOW (ref 8.9–10.3)
Chloride: 106 mmol/L (ref 101–111)
Creatinine, Ser: 0.9 mg/dL (ref 0.61–1.24)
Glucose, Bld: 180 mg/dL — ABNORMAL HIGH (ref 65–99)
POTASSIUM: 3.6 mmol/L (ref 3.5–5.1)
Sodium: 137 mmol/L (ref 135–145)

## 2017-05-20 LAB — HIV ANTIBODY (ROUTINE TESTING W REFLEX): HIV SCREEN 4TH GENERATION: NONREACTIVE

## 2017-05-20 LAB — CBC
HEMATOCRIT: 40.5 % (ref 39.0–52.0)
HEMOGLOBIN: 13.1 g/dL (ref 13.0–17.0)
MCH: 27 pg (ref 26.0–34.0)
MCHC: 32.3 g/dL (ref 30.0–36.0)
MCV: 83.3 fL (ref 78.0–100.0)
Platelets: 129 10*3/uL — ABNORMAL LOW (ref 150–400)
RBC: 4.86 MIL/uL (ref 4.22–5.81)
RDW: 15 % (ref 11.5–15.5)
WBC: 3.6 10*3/uL — ABNORMAL LOW (ref 4.0–10.5)

## 2017-05-20 LAB — TSH: TSH: 2.196 u[IU]/mL (ref 0.350–4.500)

## 2017-05-20 LAB — VITAMIN B12: Vitamin B-12: 337 pg/mL (ref 180–914)

## 2017-05-20 LAB — AMMONIA: AMMONIA: 38 umol/L — AB (ref 9–35)

## 2017-05-20 MED ORDER — ALBUTEROL SULFATE HFA 108 (90 BASE) MCG/ACT IN AERS: 2.0000 | INHALATION_SPRAY | Freq: Four times a day (QID) | RESPIRATORY_TRACT | Status: DC | PRN

## 2017-05-20 MED ORDER — ESCITALOPRAM OXALATE 10 MG PO TABS
20.0000 mg | ORAL_TABLET | Freq: Every day | ORAL | Status: DC
  Administered 2017-05-20 – 2017-05-23 (×4): 20 mg via ORAL
  Filled 2017-05-20 (×4): qty 2

## 2017-05-20 MED ORDER — PANTOPRAZOLE SODIUM 40 MG PO TBEC
40.0000 mg | DELAYED_RELEASE_TABLET | Freq: Every day | ORAL | Status: DC
  Administered 2017-05-20 – 2017-05-23 (×4): 40 mg via ORAL
  Filled 2017-05-20 (×4): qty 1

## 2017-05-20 MED ORDER — ACETAMINOPHEN 325 MG PO TABS
650.0000 mg | ORAL_TABLET | Freq: Four times a day (QID) | ORAL | Status: DC | PRN
  Administered 2017-05-20 – 2017-05-23 (×7): 650 mg via ORAL
  Filled 2017-05-20 (×7): qty 2

## 2017-05-20 MED ORDER — ACETAMINOPHEN 650 MG RE SUPP: 650.0000 mg | Freq: Four times a day (QID) | RECTAL | Status: DC | PRN

## 2017-05-20 MED ORDER — ALBUTEROL SULFATE (2.5 MG/3ML) 0.083% IN NEBU: 2.5000 mg | INHALATION_SOLUTION | Freq: Four times a day (QID) | RESPIRATORY_TRACT | Status: DC | PRN

## 2017-05-20 MED ORDER — ONDANSETRON HCL 4 MG/2ML IJ SOLN: 4.0000 mg | Freq: Four times a day (QID) | INTRAMUSCULAR | Status: DC | PRN

## 2017-05-20 MED ORDER — STROKE: EARLY STAGES OF RECOVERY BOOK
Freq: Once | Status: AC
  Administered 2017-05-20: 17:00:00
  Filled 2017-05-20: qty 1

## 2017-05-20 MED ORDER — LORATADINE 10 MG PO TABS
10.0000 mg | ORAL_TABLET | Freq: Every day | ORAL | Status: DC
  Administered 2017-05-20 – 2017-05-23 (×4): 10 mg via ORAL
  Filled 2017-05-20 (×4): qty 1

## 2017-05-20 MED ORDER — ATORVASTATIN CALCIUM 40 MG PO TABS
40.0000 mg | ORAL_TABLET | Freq: Every day | ORAL | Status: DC
  Administered 2017-05-20 – 2017-05-22 (×3): 40 mg via ORAL
  Filled 2017-05-20 (×3): qty 1

## 2017-05-20 MED ORDER — KETOROLAC TROMETHAMINE 30 MG/ML IJ SOLN
30.0000 mg | Freq: Once | INTRAMUSCULAR | Status: AC
  Administered 2017-05-20: 30 mg via INTRAVENOUS
  Filled 2017-05-20: qty 1

## 2017-05-20 MED ORDER — INSULIN ASPART 100 UNIT/ML ~~LOC~~ SOLN
0.0000 [IU] | Freq: Three times a day (TID) | SUBCUTANEOUS | Status: DC
  Administered 2017-05-20 (×2): 1 [IU] via SUBCUTANEOUS
  Administered 2017-05-21: 2 [IU] via SUBCUTANEOUS
  Administered 2017-05-21 – 2017-05-22 (×2): 1 [IU] via SUBCUTANEOUS
  Administered 2017-05-23 (×2): 2 [IU] via SUBCUTANEOUS

## 2017-05-20 MED ORDER — GUAIFENESIN-DM 100-10 MG/5ML PO SYRP
5.0000 mL | ORAL_SOLUTION | ORAL | Status: DC | PRN
  Administered 2017-05-20 – 2017-05-23 (×5): 5 mL via ORAL
  Filled 2017-05-20 (×5): qty 10

## 2017-05-20 MED ORDER — LISINOPRIL 20 MG PO TABS
40.0000 mg | ORAL_TABLET | Freq: Every day | ORAL | Status: DC
  Administered 2017-05-20 – 2017-05-21 (×2): 40 mg via ORAL
  Filled 2017-05-20 (×2): qty 2

## 2017-05-20 MED ORDER — TAMSULOSIN HCL 0.4 MG PO CAPS
0.8000 mg | ORAL_CAPSULE | Freq: Every day | ORAL | Status: DC
  Administered 2017-05-20 – 2017-05-22 (×3): 0.8 mg via ORAL
  Filled 2017-05-20 (×3): qty 2

## 2017-05-20 MED ORDER — ONDANSETRON HCL 4 MG PO TABS: 4.0000 mg | ORAL_TABLET | Freq: Four times a day (QID) | ORAL | Status: DC | PRN

## 2017-05-20 MED ORDER — FLUTICASONE FUROATE-VILANTEROL 100-25 MCG/INH IN AEPB
1.0000 | INHALATION_SPRAY | Freq: Every day | RESPIRATORY_TRACT | Status: DC
  Administered 2017-05-20 – 2017-05-23 (×4): 1 via RESPIRATORY_TRACT
  Filled 2017-05-20: qty 28

## 2017-05-20 NOTE — Progress Notes (Signed)
EEG Completed; Results Pending  

## 2017-05-20 NOTE — Care Management Note (Signed)
Case Management Note  Patient Details  Name: ALEXSANDRO SALEK MRN: [id number] Date of Birth: 1956/08/13  Subjective/Objective: 61 y/o m admitted w/Acute encephalopathy. From home.                   Action/Plan:d/c plan home.   Expected Discharge Date:                  Expected Discharge Plan:  Home/Self Care  In-House Referral:     Discharge planning Services  CM Consult  Post Acute Care Choice:    Choice offered to:     DME Arranged:    DME Agency:     HH Arranged:    HH Agency:     Status of Service:  In process, will continue to follow  If discussed at Long Length of Stay Meetings, dates discussed:    Additional Comments:  Dessa Phi, RN 05/20/2017, 3:20 PM

## 2017-05-20 NOTE — Progress Notes (Signed)
Pt refused cpap tonight stating that he just doesn't want it.  Pt was advised that RT is available all night should he change his mind.  RN aware.
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
## 2017-05-20 NOTE — Progress Notes (Signed)
Patient is seen examined. Please see today's progress note for the details. 61 y/o male with PMH of HTN, COPD, OSA, Sinus node dysfunction s/p PPM is admitted with encephalopathy. R/o seizure. Patient reports multiple episodes of non epileptic seizure like activity athome. Pend EEG, neurology eval. Monitor on tele. Wesley [name]

## 2017-05-20 NOTE — Procedures (Signed)
ELECTROENCEPHALOGRAM REPORT  Date of Study: 05/20/2017  Patient's Name: WILIAN KWONG MRN: [id number] Date of Birth: 1956/02/01  Referring Provider: Dr. Gean Birchwood  Clinical History: This is a 61 year old man with altered mental status.  Medications: acetaminophen (TYLENOL) tablet 650 mg  albuterol (PROVENTIL) (2.5 MG/3ML) 0.083% nebulizer solution 2.5 mg  atorvastatin (LIPITOR) tablet 40 mg  escitalopram (LEXAPRO) tablet 20 mg  fluticasone furoate-vilanterol (BREO ELLIPTA) 100-25 MCG/INH 1 puff  insulin aspart (novoLOG) injection 0-9 Units  lisinopril (PRINIVIL,ZESTRIL) tablet 40 mg  loratadine (CLARITIN) tablet 10 mg  pantoprazole (PROTONIX) EC tablet 40 mg  tamsulosin (FLOMAX) capsule 0.8 mg   Technical Summary: A multichannel digital EEG recording measured by the international 10-20 system with electrodes applied with paste and impedances below 5000 ohms performed in our laboratory with EKG monitoring in an awake and asleep patient.  Hyperventilation was not performed. Photic stimulation was performed.  The digital EEG was referentially recorded, reformatted, and digitally filtered in a variety of bipolar and referential montages for optimal display.    Description: The patient is awake and asleep during the recording.  During maximal wakefulness, there is a symmetric, medium voltage 9 Hz posterior dominant rhythm that attenuates with eye opening.  The record is symmetric.  During drowsiness and stage I sleep, there is an increase in theta slowing of the background with occasional vertex waves seen.  Photic stimulation did not elicit any abnormalities.  There were no epileptiform discharges or electrographic seizures seen.    EKG lead showed sinus bradycardia at 60 bpm.  Impression: This awake and asleep EEG is normal.    Clinical Correlation: A normal EEG does not exclude a clinical diagnosis of epilepsy. Clinical correlation is advised.   Ellouise Newer, M.D.

## 2017-05-21 ENCOUNTER — Inpatient Hospital Stay (HOSPITAL_COMMUNITY): Payer: Medicaid Other

## 2017-05-21 DIAGNOSIS — R4182 Altered mental status, unspecified: Secondary | ICD-10-CM

## 2017-05-21 LAB — GLUCOSE, CAPILLARY
Glucose-Capillary: 113 mg/dL — ABNORMAL HIGH (ref 65–99)
Glucose-Capillary: 151 mg/dL — ABNORMAL HIGH (ref 65–99)
Glucose-Capillary: 133 mg/dL — ABNORMAL HIGH (ref 65–99)
Glucose-Capillary: 147 mg/dL — ABNORMAL HIGH (ref 65–99)

## 2017-05-21 LAB — TROPONIN I
Troponin I: <0.03 ng/mL (ref <0.03)
Troponin I: <0.03 ng/mL (ref <0.03)

## 2017-05-21 MED ORDER — LEVOFLOXACIN IN D5W 750 MG/150ML IV SOLN
750.0000 mg | INTRAVENOUS | Status: DC
  Administered 2017-05-21 – 2017-05-22 (×2): 750 mg via INTRAVENOUS
  Filled 2017-05-21 (×3): qty 150

## 2017-05-21 MED ORDER — ENOXAPARIN SODIUM 40 MG/0.4ML ~~LOC~~ SOLN
40.0000 mg | SUBCUTANEOUS | Status: DC
  Administered 2017-05-21 – 2017-05-22 (×2): 40 mg via SUBCUTANEOUS
  Filled 2017-05-21 (×2): qty 0.4

## 2017-05-21 MED ORDER — KETOROLAC TROMETHAMINE 15 MG/ML IJ SOLN
15.0000 mg | Freq: Once | INTRAMUSCULAR | Status: AC
  Administered 2017-05-21: 15 mg via INTRAVENOUS
  Filled 2017-05-21: qty 1

## 2017-05-21 NOTE — Progress Notes (Addendum)
PROGRESS NOTE    Wesley [name]  XLK:[id number] DOB: 1956/12/12 DOA: 05/19/2017 PCP: Boykin Nearing, MD    Brief Narrative: HPI: Wesley [name] is a 61 y.o. male with history of COPD, sleep apnea, sick sinus syndrome status post pacemaker placement was brought to the ER patient was found to be increasingly lethargic and confused. As per patient's wife provided the history patient was doing fine when she left home for work around 2 PM yesterday. Patient's daughter noticed that patient was increasingly confused and on patient and wife arrival at 5:30 PM patient was slumped to the right side with drowsy and lethargic.   ED Course: In the ER CT head was unremarkable. Patient continues to be lethargic. Patient is being admitted for further observation. Previously patient has had staring episodes and in January was seen by neurologist and at that time it was attributed to hypotensive episodes. EEG was unremarkable. As per the family patient has not been on any new medications.    Assessment & Plan:   Principal Problem:   Acute encephalopathy Active Problems:   Essential hypertension   COPD mixed type (HCC)   SSS (sick sinus syndrome) (HCC)   Obstructive sleep apnea   1-Acute encephalopathy, lethargic.  Repeat ct head, report left arm tingling.  Neurology consulted.  EEG negative.  TSH normal, RPR negative, B 12 normal.  Appreciate neurology; evaluation. Will check orthostatic.   2-near syncope ?  Cycle enzymes,  IV fluids.  check orthostatic,  3-PNA; report cough,. Chest x ray with PNA.  Start Levaquin.   4-HTN; check orthostatic,  Hold lisinopril.   DVT prophylaxis: lovenox Code Status: full code.  Family Communication: wife who was at bedside,.  Disposition Plan: home in 24 hours.    Consultants:   Neurology    Procedures:none   Antimicrobials:   levaquin    Subjective: He is complaining of right arm tingling.  He has had several of spell, he gets  lethargic, confused,  Complaining of cough since Sunday   Objective: Vitals:   05/20/17 2105 05/21/17 0437 05/21/17 1356 05/21/17 1418  BP: 106/68 110/81 118/69 (!) 113/58  Pulse: 72 64 96 68  Resp: 18 14 18   Temp: 97.8 F (36.6 C) 97.6 F (36.4 C) 98.7 F (37.1 C)   TempSrc: Oral Oral Oral   SpO2: 97% 99% 96%   Weight:      Height:        Intake/Output Summary (Last 24 hours) at 05/21/17 1618 Last data filed at 05/21/17 1500  Gross per 24 hour  Intake          2293.75 ml  Output             1125 ml  Net          11 68.75 ml   Filed Weights   05/20/17 0252  Weight: 93.8 kg (206 lb 12.7 oz)    Examination:  General exam: Appears calm and comfortable  Respiratory system: crackle right side.  Respiratory effort normal. Cardiovascular system: S1 & S2 heard, RRR. No JVD, murmurs, rubs, gallops or clicks. No pedal edema. Gastrointestinal system: Abdomen is nondistended, soft and nontender. No organomegaly or masses felt. Normal bowel sounds heard. Central nervous system: Alert and oriented. No focal neurological deficits. Extremities: Symmetric 5 x 5 power. Mild weakness right leg Skin: No rashes, lesions or ulcers Psychiatry: Judgement and insight appear normal. Mood & affect appropriate.     Data Reviewed: I have personally reviewed following labs  and imaging studies  CBC:  Recent Labs Lab 05/19/17 2004 05/19/17 2015 05/20/17 0532  WBC 4.6  --  3.6*  NEUTROABS 2.8  --   --   HGB 14.3 15.6 13.1  HCT 43.0 46.0 40.5  MCV 82.2  --  83.3  PLT 137*  --  599*   Basic Metabolic Panel:  Recent Labs Lab 05/19/17 2004 05/19/17 2015 05/20/17 0532  NA 138 139 137  K 3.4* 3.3* 3.6  CL 104 101 106  CO2 26  --  25  GLUCOSE 113* 108* 180*  BUN 16 17 14   CREATININE 0.91 1.00 0.90  CALCIUM 9.2  --  8.7*   GFR: Estimated Creatinine Clearance: 101.5 mL/min (by C-G formula based on SCr of 0.9 mg/dL). Liver Function Tests:  Recent Labs Lab 05/19/17 2004  AST 24   ALT 36  ALKPHOS 104  BILITOT 0.2*  PROT 7.4  ALBUMIN 4.3   No results for input(s): LIPASE, AMYLASE in the last 168 hours.  Recent Labs Lab 05/20/17 0152  AMMONIA 38*   Coagulation Profile:  Recent Labs Lab 05/19/17 2004  INR 1.03   Cardiac Enzymes: No results for input(s): CKTOTAL, CKMB, CKMBINDEX, TROPONINI in the last 168 hours. BNP (last 3 results) No results for input(s): PROBNP in the last 8760 hours. HbA1C: No results for input(s): HGBA1C in the last 72 hours. CBG:  Recent Labs Lab 05/20/17 1135 05/20/17 1656 05/20/17 2110 05/21/17 0738 05/21/17 1201  GLUCAP 145* 123* 179* 113* 151*   Lipid Profile: No results for input(s): CHOL, HDL, LDLCALC, TRIG, CHOLHDL, LDLDIRECT in the last 72 hours. Thyroid Function Tests:  Recent Labs  05/20/17 0153  TSH 2.196   Anemia Panel:  Recent Labs  05/20/17 0153  VITAMINB12 337   Sepsis Labs: No results for input(s): PROCALCITON, LATICACIDVEN in the last 168 hours.  No results found for this or any previous visit (from the past 240 hour(s)).       Radiology Studies: Dg Chest 2 View  Result Date: 05/21/2017 CLINICAL DATA:  Cough and fever. History of COPD, hypertension, former smoker. EXAM: CHEST  2 VIEW COMPARISON:  PA and lateral chest x-ray of Apr 18, 2017 FINDINGS: The lungs are well-expanded. There are bullous changes in the right apex which are stable. There is patchy increased density in the right middle lobe which is new. The left lung is clear. The heart and pulmonary vascularity are normal. The permanent pacemaker is in stable position. The bony thorax exhibits no acute abnormality. IMPRESSION: Right middle lobe atelectasis or pneumonia. Underlying bullous emphysema. Followup PA and lateral chest X-ray is recommended in 3-4 weeks following trial of antibiotic therapy to ensure resolution and exclude underlying malignancy. Electronically Signed   By: David  Martinique M.D.   On: 05/21/2017 15:11   Dg  Shoulder Right  Result Date: 05/19/2017 CLINICAL DATA:  Right shoulder pain for a while. EXAM: RIGHT SHOULDER - 2+ VIEW COMPARISON:  None. FINDINGS: There is no evidence of fracture or dislocation. Small glenohumeral osteophytes consistent with mild osteoarthritis. No evidence of inflammatory arthropathy. Soft tissues are unremarkable. IMPRESSION: Mild glenohumeral osteoarthritis.  No acute osseous abnormality. Electronically Signed   By: Jeb Levering M.D.   On: 05/19/2017 21:00   Ct Head Wo Contrast  Result Date: 05/21/2017 CLINICAL DATA:  More lethargic than typical.  Evaluate for infarct. EXAM: CT HEAD WITHOUT CONTRAST TECHNIQUE: Contiguous axial images were obtained from the base of the skull through the vertex without intravenous contrast. COMPARISON:  05/19/2017 FINDINGS: Brain: There is no evidence for acute hemorrhage, hydrocephalus, mass lesion, or abnormal extra-axial fluid collection. No definite CT evidence for acute infarction. Diffuse loss of parenchymal volume is consistent with atrophy. Patchy low attenuation in the deep hemispheric and periventricular white matter is nonspecific, but likely reflects chronic microvascular ischemic demyelination. No change lacunar infarct left basal ganglia. Vascular: Atherosclerotic calcification is visualized in the intracranial segments of the internal carotid arteries. No dense MCA sign. Major dural sinuses are unremarkable. Skull: No evidence for fracture. No worrisome lytic or sclerotic lesion. Sinuses/Orbits: The visualized paranasal sinuses and mastoid air cells are clear. Visualized portions of the globes and intraorbital fat are unremarkable. Other: None. IMPRESSION: 1. Stable.  No acute intracranial abnormality. 2. Atrophy with chronic small vessel white matter ischemic disease. Electronically Signed   By: Misty Stanley M.D.   On: 05/21/2017 15:00   Ct Head Code Stroke W/o Cm  Result Date: 05/19/2017 CLINICAL DATA:  Code stroke.  Headache and  right-sided numbness EXAM: CT HEAD WITHOUT CONTRAST TECHNIQUE: Contiguous axial images were obtained from the base of the skull through the vertex without intravenous contrast. COMPARISON:  Head CT 12/19/2016 FINDINGS: Brain: No mass lesion, intraparenchymal hemorrhage or extra-axial collection. No evidence of acute cortical infarct. 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 Vascular: No hyperdense vessel or unexpected calcification. Skull: Normal visualized skull base, calvarium and extracranial soft tissues. Sinuses/Orbits: No sinus fluid levels or advanced mucosal thickening. No mastoid effusion. Normal orbits. ASPECTS [facility] Stroke Program Early CT Score) - Ganglionic level infarction (caudate, lentiform nuclei, internal capsule, insula, M1-M3 cortex): 7 - Supraganglionic infarction (M4-M6 cortex): 3 Total score (0-10 with 10 being normal): 10 IMPRESSION: 1. No acute intracranial abnormality. Old left basal ganglia lacunar infarct. 2. ASPECTS is 10. Electronically Signed   By: Ulyses Jarred M.D.   On: 05/19/2017 19:34        Scheduled Meds: . atorvastatin  40 mg Oral q1800  . escitalopram  20 mg Oral Daily  . fluticasone furoate-vilanterol  1 puff Inhalation Daily  . insulin aspart  0-9 Units Subcutaneous TID WC  . loratadine  10 mg Oral Daily  . pantoprazole  40 mg Oral Daily  . tamsulosin  0.8 mg Oral QPC supper   Continuous Infusions: . sodium chloride 75 mL/hr at 05/21/17 0506     LOS: 1 day    Time spent: 35 minutes.     Elmarie Shiley, MD [facility] Pager [phone]  If 7PM-7AM, please contact night-coverage [email or web address] Password [email or web address] 05/21/2017, 4:18 PM

## 2017-05-21 NOTE — Progress Notes (Signed)
RT NOTE:  CPAP setup @ bedside for patient. Auto settings 16/6, No O2, Sterile water added to humidity chamber. Pt wearing and agrees he is comfortable @ this time.
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
## 2017-05-21 NOTE — Progress Notes (Signed)
Pharmacy Antibiotic Note  Wesley [name] is a 61 y.o. male admitted on 05/19/2017 with encephalopathy and lethargy. Today noted with cough and CXR shows pneumonia.  Pharmacy has been consulted for Levaquin dosing.  Plan:  Levofloxacin 750 mg IV q24 hr  Recommended duration is 5 days for CAP  Height: 6\' 2"  (188 cm) Weight: 206 lb 12.7 oz (93.8 kg) IBW/kg (Calculated) : 82.2  Temp (24hrs), Avg:98 F (36.7 C), Min:97.6 F (36.4 C), Max:98.7 F (37.1 C)   Recent Labs Lab 05/19/17 2004 05/19/17 2015 05/20/17 0532  WBC 4.6  --  3.6*  CREATININE 0.91 1.00 0.90    Estimated Creatinine Clearance: 101.5 mL/min (by C-G formula based on SCr of 0.9 mg/dL).    Allergies  Allergen Reactions  . Penicillins Swelling    Has patient had a PCN reaction causing immediate rash, facial/tongue/throat swelling, SOB or lightheadedness with hypotension: unknown Has patient had a PCN reaction causing severe rash involving mucus membranes or skin necrosis: unknown Has patient had a PCN reaction that required hospitalization : unknown Has patient had a PCN reaction occurring within the last 10 years: no If all of the above answers are "NO", then may proceed with Cephalosporin use.      Thank you for allowing pharmacy to be a part of this patient's care.  Reuel Boom, PharmD, BCPS Pager: [phone] 05/21/2017, 4:52 PM

## 2017-05-22 LAB — BASIC METABOLIC PANEL
ANION GAP: 9 (ref 5–15)
BUN: 15 mg/dL (ref 6–20)
CHLORIDE: 106 mmol/L (ref 101–111)
CO2: 22 mmol/L (ref 22–32)
Calcium: 8.4 mg/dL — ABNORMAL LOW (ref 8.9–10.3)
Creatinine, Ser: 0.92 mg/dL (ref 0.61–1.24)
GFR calc Af Amer: >60 mL/min (ref >60)
GFR calc non Af Amer: >60 mL/min (ref >60)
GLUCOSE: 96 mg/dL (ref 65–99)
POTASSIUM: 3.9 mmol/L (ref 3.5–5.1)
Sodium: 137 mmol/L (ref 135–145)

## 2017-05-22 LAB — CBC
HCT: 41 % (ref 39.0–52.0)
Hemoglobin: 13.4 g/dL (ref 13.0–17.0)
MCH: 27.1 pg (ref 26.0–34.0)
MCHC: 32.7 g/dL (ref 30.0–36.0)
MCV: 82.8 fL (ref 78.0–100.0)
Platelets: 135 10*3/uL — ABNORMAL LOW (ref 150–400)
RBC: 4.95 MIL/uL (ref 4.22–5.81)
RDW: 14.7 % (ref 11.5–15.5)
WBC: 3.8 10*3/uL — AB (ref 4.0–10.5)

## 2017-05-22 LAB — GLUCOSE, CAPILLARY
GLUCOSE-CAPILLARY: 120 mg/dL — AB (ref 65–99)
GLUCOSE-CAPILLARY: 111 mg/dL — AB (ref 65–99)
Glucose-Capillary: 132 mg/dL — ABNORMAL HIGH (ref 65–99)
Glucose-Capillary: 106 mg/dL — ABNORMAL HIGH (ref 65–99)

## 2017-05-22 LAB — TROPONIN I: Troponin I: <0.03 ng/mL (ref <0.03)

## 2017-05-22 MED ORDER — VITAMIN B-12 100 MCG PO TABS
100.0000 ug | ORAL_TABLET | Freq: Every day | ORAL | Status: DC
  Administered 2017-05-22 – 2017-05-23 (×2): 100 ug via ORAL
  Filled 2017-05-22 (×2): qty 1

## 2017-05-22 MED ORDER — SALINE SPRAY 0.65 % NA SOLN
1.0000 | NASAL | Status: DC | PRN
  Administered 2017-05-22: 1 via NASAL
  Filled 2017-05-22: qty 44

## 2017-05-22 MED ORDER — DIPHENHYDRAMINE HCL 25 MG PO CAPS
25.0000 mg | ORAL_CAPSULE | Freq: Once | ORAL | Status: AC
  Administered 2017-05-22: 25 mg via ORAL
  Filled 2017-05-22: qty 1

## 2017-05-22 NOTE — Progress Notes (Signed)
Spoke with pt regarding cpap.  Pt stated his nose is congested and refuses cpap tonight.  Pt was advised that RT is available all night and encouraged him to call, should he change his mind.  RN aware.

## 2017-05-22 NOTE — Evaluation (Signed)
Occupational Therapy Evaluation Patient Details Name: Wesley [name] MRN: [id number] DOB: 02-20-1956 Today's Date: 05/22/2017    History of Present Illness 61 yo male admitted with acute encephaolpathy, syncope. Hx of CVA, COPD, pacemaker, substance abuse, sleep apnea, SSS-pacemaker, MI, L sided weakness   Clinical Impression   Pt was admitted for the above.  Pt mostly set up/supervison for adls but recommend min guard for shower transfer and recommend he uses his seat. Educated on energy conservation.  No further OT is needed at this time    Follow Up Recommendations  Supervision/Assistance - 24 hour    Equipment Recommendations  None recommended by OT    Recommendations for Other Services       Precautions / Restrictions Precautions Precautions: Fall Precaution Comments: lightheadedness Restrictions Weight Bearing Restrictions: No      Mobility Bed Mobility Overal bed mobility: Modified Independent                Transfers Overall transfer level: Needs assistance   Transfers: Sit to/from Stand Sit to Stand: Supervision         General transfer comment: for safety    Balance                                           ADL either performed or assessed with clinical judgement   ADL Overall ADL's : Needs assistance/impaired     Grooming: Supervision/safety;Standing   Upper Body Bathing: Set up;Sitting   Lower Body Bathing: Supervison/ safety;Sit to/from stand   Upper Body Dressing : Set up;Sitting   Lower Body Dressing: Supervision/safety;Sit to/from stand   Toilet Transfer: Supervision/safety;Ambulation;Comfort height toilet   Toileting- Clothing Manipulation and Hygiene: Supervision/safety;Sit to/from stand   Tub/ Shower Transfer: Tub transfer;Min guard;Ambulation     General ADL Comments: pt a little unsteady when simulating tub transfer: recommend min guard for safety.  Educated on energy conservation and gave him  handouts.  He has a shower seat but usually gets to bottom of tub     Vision         Perception     Praxis      Pertinent Vitals/Pain Pain Assessment: No/denies pain     Hand Dominance     Extremity/Trunk Assessment Upper Extremity Assessment Upper Extremity Assessment: Overall WFL for tasks assessed for adls      Cervical / Trunk Assessment Cervical / Trunk Assessment: Normal   Communication Communication Communication: No difficulties   Cognition Arousal/Alertness: Awake/alert Behavior During Therapy: Flat affect                                   General Comments: WFLs for tasks assessed; little verbali   General Comments       Exercises     Shoulder Instructions      Home Living Family/patient expects to be discharged to:: Private residence Living Arrangements: Spouse/significant other Available Help at Discharge: Family Type of Home: House Home Access: Stairs to enter Technical brewer of Steps: 3   Home Layout: One level     Bathroom Shower/Tub: Teacher, early years/pre: Standard     Home Equipment: Cane - single point;Shower seat   Additional Comments: has not been using shower seat, but he has one      Prior Functioning/Environment Level of Independence:  Independent        Comments: uses cane sometimes        OT Problem List:        OT Treatment/Interventions:      OT Goals(Current goals can be found in the care plan section) Acute Rehab OT Goals Patient Stated Goal: none stated OT Goal Formulation: All assessment and education complete, DC therapy  OT Frequency:     Barriers to D/C:            Co-evaluation              AM-PAC PT "6 Clicks" Daily Activity     Outcome Measure Help from another person eating meals?: None Help from another person taking care of personal grooming?: A Little Help from another person toileting, which includes using toliet, bedpan, or urinal?: A  Little Help from another person bathing (including washing, rinsing, drying)?: A Little Help from another person to put on and taking off regular upper body clothing?: A Little Help from another person to put on and taking off regular lower body clothing?: A Little 6 Click Score: 19   End of Session    Activity Tolerance: Patient tolerated treatment well Patient left: in bed;with call bell/phone within reach  OT Visit Diagnosis: Muscle weakness (generalized) (M62.81)                Time: 1886-7737 OT Time Calculation (min): 12 min Charges:  OT General Charges $OT Visit: 1 Procedure OT Evaluation $OT Eval Low Complexity: 1 Procedure G-Codes:     Wesley [name], OTR/L [phone] 05/22/2017  Wesley [name] 05/22/2017, 2:03 PM

## 2017-05-22 NOTE — Evaluation (Addendum)
Physical Therapy Evaluation Patient Details Name: Wesley [name] MRN: [id number] DOB: 02/27/1956 Today's Date: 05/22/2017   History of Present Illness  61 yo male admitted with acute encephaolpathy, syncope. Hx of CVA, COPD, pacemaker, substance abuse, sleep apnea, SSS-pacemaker, MI, L sided weakness  Clinical Impression  On eval, pt was Min guard assist for mobility. He walked ~100 feet in hallway. Pt declined RW use. Pt c/o mild lightheadedness during ambulation. He c/o mostly of not feeling well. He did not speak much during session. Wife present during session. She answered at least 50% of questions I asked. Recommend daily ambulation with nursing supervision. Will follow.     Follow Up Recommendations Supervision for mobility/OOB    Equipment Recommendations  None recommended by PT    Recommendations for Other Services       Precautions / Restrictions Precautions Precautions: Fall Precaution Comments: lightheadedness Restrictions Weight Bearing Restrictions: No      Mobility  Bed Mobility Overal bed mobility: Modified Independent                Transfers Overall transfer level: Needs assistance   Transfers: Sit to/from Stand Sit to Stand: Supervision         General transfer comment: for safety  Ambulation/Gait Ambulation/Gait assistance: Min guard Ambulation Distance (Feet): 100 Feet Assistive device: None (Iv pole) Gait Pattern/deviations: Step-through pattern;Decreased stride length     General Gait Details: Pt c/o mild lightheadedness. he was able to walk with and without the support of the IV pole.   Stairs            Wheelchair Mobility    Modified Rankin (Stroke Patients Only)       Balance                                             Pertinent Vitals/Pain Pain Assessment: No/denies pain    Home Living Family/patient expects to be discharged to:: Private residence Living Arrangements: Spouse/significant  other Available Help at Discharge: Family (wife works) Type of Home: [employer] Home Access: Stairs to enter   Technical brewer of Steps: [place]: One level        Prior Function Level of Independence: Independent         Comments: uses cane sometimes     Hand Dominance        Extremity/Trunk Assessment   Upper Extremity Assessment Upper Extremity Assessment: Defer to OT evaluation    Lower Extremity Assessment Lower Extremity Assessment: Generalized weakness    Cervical / Trunk Assessment Cervical / Trunk Assessment: Normal  Communication   Communication: No difficulties  Cognition Arousal/Alertness: Awake/alert Behavior During Therapy: Flat affect                                   General Comments: very little verbalization even when questioned      General Comments      Exercises     Assessment/Plan    PT Assessment Patient needs continued PT services  PT Problem List Decreased mobility;Decreased activity tolerance       PT Treatment Interventions Functional mobility training;Gait training;Therapeutic activities;Therapeutic exercise;Patient/family education    PT Goals (Current goals can be found in the Care Plan section)  Acute Rehab PT Goals Patient Stated Goal: none stated PT Goal Formulation:  With patient/family Time For Goal Achievement: 06/05/17 Potential to Achieve Goals: Good    Frequency Min 3X/week   Barriers to discharge        Co-evaluation               AM-PAC PT "6 Clicks" Daily Activity  Outcome Measure Difficulty turning over in bed (including adjusting bedclothes, sheets and blankets)?: None Difficulty moving from lying on back to sitting on the side of the bed? : None Difficulty sitting down on and standing up from a chair with arms (e.g., wheelchair, bedside commode, etc,.)?: A Little Help needed moving to and from a bed to chair (including a wheelchair)?: A Little Help needed walking in  hospital room?: A Little Help needed climbing 3-5 steps with a railing? : A Little 6 Click Score: 20    End of Session Equipment Utilized During Treatment: Gait belt Activity Tolerance: Other (comment) (pt c/o not feeling well) Patient left: in bed;with call bell/phone within reach;with family/visitor present;with nursing/sitter in room   PT Visit Diagnosis: Muscle weakness (generalized) (M62.81);Difficulty in walking, not elsewhere classified (R26.2)    Time: 1040-1051 PT Time Calculation (min) (ACUTE ONLY): 11 min   Charges:   PT Evaluation $PT Eval Low Complexity: 1 Procedure     PT G Codes:          Weston Anna, MPT Pager: [phone]

## 2017-05-22 NOTE — Progress Notes (Signed)
PROGRESS NOTE    Wesley [name]  RDE:[id number] DOB: Aug 28, 1956 DOA: 05/19/2017 PCP: Boykin Nearing, MD    Brief Narrative: HPI: Wesley [name] is a 61 y.o. male with history of COPD, sleep apnea, sick sinus syndrome status post pacemaker placement was brought to the ER patient was found to be increasingly lethargic and confused. As per patient's wife provided the history patient was doing fine when she left home for work around 2 PM yesterday. Patient's daughter noticed that patient was increasingly confused and on patient and wife arrival at 5:30 PM patient was slumped to the right side with drowsy and lethargic.   ED Course: In the ER CT head was unremarkable. Patient continues to be lethargic. Patient is being admitted for further observation. Previously patient has had staring episodes and in January was seen by neurologist and at that time it was attributed to hypotensive episodes. EEG was unremarkable. As per the family patient has not been on any new medications.    Assessment & Plan:   Principal Problem:   Acute encephalopathy Active Problems:   Essential hypertension   COPD mixed type (HCC)   SSS (sick sinus syndrome) (HCC)   Obstructive sleep apnea   1-Acute encephalopathy, lethargic.  Repeat ct head, report left arm tingling.  Neurology consulted. Unlikely seizure.  EEG negative.  TSH normal, RPR negative, B 12 normal.  Appreciate neurology; evaluation. Orthostatic negative.  This might be related to PNA.  He is feeling weak today, no very conversant.   2-near syncope ?  Enzymes negative IV fluids.  Orthostatic negative  3-PNA; report cough,. Chest x ray with PNA.  Continue with  Levaquin.   4-HTN; Hold lisinopril.   DVT prophylaxis: lovenox Code Status: full code.  Family Communication: wife who was at bedside,.  Disposition Plan: home in 24 hours.    Consultants:   Neurology    Procedures:none   Antimicrobials:   levaquin     Subjective: He is not very conversant today, feeling tired, no energy   Objective: Vitals:   05/22/17 0558 05/22/17 0803 05/22/17 1141 05/22/17 1440  BP: 134/81   122/76  Pulse: 61     Resp: 18   20  Temp: 100 F (37.8 C)  98.4 F (36.9 C) 98.3 F (36.8 C)  TempSrc: Oral  Oral Oral  SpO2: 100% 99%  96%  Weight:      Height:        Intake/Output Summary (Last 24 hours) at 05/22/17 1442 Last data filed at 05/22/17 1440  Gross per 24 hour  Intake             2910 ml  Output             3700 ml  Net             -790 ml   Filed Weights   05/20/17 0252  Weight: 93.8 kg (206 lb 12.7 oz)    Examination:  General exam: no acute distress.  Respiratory system: Normal respiratory effort. No wheezing. Sporadic crackles.  Cardiovascular system: S 1, S 2 RRR, no edema  Gastrointestinal system; soft, nt, nd Central nervous system: Alert and oriented. Generalized weakness.  Extremities: symmetric power.  Skin: No rashes, lesions or ulcers Psychiatry:   Data Reviewed: I have personally reviewed following labs and imaging studies  CBC:  Recent Labs Lab 05/19/17 2004 05/19/17 2015 05/20/17 0532 05/22/17 0413  WBC 4.6  --  3.6* 3.8*  NEUTROABS 2.8  --   --   --  HGB 14.3 15.6 13.1 13.4  HCT 43.0 46.0 40.5 41.0  MCV 82.2  --  83.3 82.8  PLT 137*  --  129* 539*   Basic Metabolic Panel:  Recent Labs Lab 05/19/17 2004 05/19/17 2015 05/20/17 0532 05/22/17 0413  NA 138 139 137 137  K 3.4* 3.3* 3.6 3.9  CL 104 101 106 106  CO2 26  --  25 22  GLUCOSE 113* 108* 180* 96  BUN 16 17 14 15   CREATININE 0.91 1.00 0.90 0.92  CALCIUM 9.2  --  8.7* 8.4*   GFR: Estimated Creatinine Clearance: 99.3 mL/min (by C-G formula based on SCr of 0.92 mg/dL). Liver Function Tests:  Recent Labs Lab 05/19/17 2004  AST 24  ALT 36  ALKPHOS 104  BILITOT 0.2*  PROT 7.4  ALBUMIN 4.3   No results for input(s): LIPASE, AMYLASE in the last 168 hours.  Recent Labs Lab  05/20/17 0152  AMMONIA 38*   Coagulation Profile:  Recent Labs Lab 05/19/17 2004  INR 1.03   Cardiac Enzymes:  Recent Labs Lab 05/21/17 1713 05/21/17 2245 05/22/17 0414  TROPONINI <0.03 <0.03 <0.03   BNP (last 3 results) No results for input(s): PROBNP in the last 8760 hours. HbA1C: No results for input(s): HGBA1C in the last 72 hours. CBG:  Recent Labs Lab 05/21/17 1201 05/21/17 1625 05/21/17 2149 05/22/17 0856 05/22/17 1210  GLUCAP 151* 133* 147* 120* 132*   Lipid Profile: No results for input(s): CHOL, HDL, LDLCALC, TRIG, CHOLHDL, LDLDIRECT in the last 72 hours. Thyroid Function Tests:  Recent Labs  05/20/17 0153  TSH 2.196   Anemia Panel:  Recent Labs  05/20/17 0153  VITAMINB12 337   Sepsis Labs: No results for input(s): PROCALCITON, LATICACIDVEN in the last 168 hours.  No results found for this or any previous visit (from the past 240 hour(s)).       Radiology Studies: Dg Chest 2 View  Result Date: 05/21/2017 CLINICAL DATA:  Cough and fever. History of COPD, hypertension, former smoker. EXAM: CHEST  2 VIEW COMPARISON:  PA and lateral chest x-ray of Apr 18, 2017 FINDINGS: The lungs are well-expanded. There are bullous changes in the right apex which are stable. There is patchy increased density in the right middle lobe which is new. The left lung is clear. The heart and pulmonary vascularity are normal. The permanent pacemaker is in stable position. The bony thorax exhibits no acute abnormality. IMPRESSION: Right middle lobe atelectasis or pneumonia. Underlying bullous emphysema. Followup PA and lateral chest X-ray is recommended in 3-4 weeks following trial of antibiotic therapy to ensure resolution and exclude underlying malignancy. Electronically Signed   By: David  Martinique M.D.   On: 05/21/2017 15:11   Ct Head Wo Contrast  Result Date: 05/21/2017 CLINICAL DATA:  More lethargic than typical.  Evaluate for infarct. EXAM: CT HEAD WITHOUT CONTRAST  TECHNIQUE: Contiguous axial images were obtained from the base of the skull through the vertex without intravenous contrast. COMPARISON:  05/19/2017 FINDINGS: Brain: There is no evidence for acute hemorrhage, hydrocephalus, mass lesion, or abnormal extra-axial fluid collection. No definite CT evidence for acute infarction. Diffuse loss of parenchymal volume is consistent with atrophy. Patchy low attenuation in the deep hemispheric and periventricular white matter is nonspecific, but likely reflects chronic microvascular ischemic demyelination. No change lacunar infarct left basal ganglia. Vascular: Atherosclerotic calcification is visualized in the intracranial segments of the internal carotid arteries. No dense MCA sign. Major dural sinuses are unremarkable. Skull: No evidence for fracture.  No worrisome lytic or sclerotic lesion. Sinuses/Orbits: The visualized paranasal sinuses and mastoid air cells are clear. Visualized portions of the globes and intraorbital fat are unremarkable. Other: None. IMPRESSION: 1. Stable.  No acute intracranial abnormality. 2. Atrophy with chronic small vessel white matter ischemic disease. Electronically Signed   By: Misty Stanley M.D.   On: 05/21/2017 15:00        Scheduled Meds: . atorvastatin  40 mg Oral q1800  . enoxaparin (LOVENOX) injection  40 mg Subcutaneous Q24H  . escitalopram  20 mg Oral Daily  . fluticasone furoate-vilanterol  1 puff Inhalation Daily  . insulin aspart  0-9 Units Subcutaneous TID WC  . loratadine  10 mg Oral Daily  . pantoprazole  40 mg Oral Daily  . tamsulosin  0.8 mg Oral QPC supper  . vitamin B-12  100 mcg Oral Daily   Continuous Infusions: . sodium chloride 75 mL/hr at 05/22/17 0936  . levofloxacin (LEVAQUIN) IV Stopped (05/21/17 1932)     LOS: 2 days    Time spent: 35 minutes.     Elmarie Shiley, MD [facility] Pager [phone]  If 7PM-7AM, please contact night-coverage [email or web address] Password  [email or web address] 05/22/2017, 2:42 PM

## 2017-05-23 LAB — GLUCOSE, CAPILLARY
GLUCOSE-CAPILLARY: 193 mg/dL — AB (ref 65–99)
Glucose-Capillary: 170 mg/dL — ABNORMAL HIGH (ref 65–99)

## 2017-05-23 LAB — FOLATE RBC: Folate, Hemolysate: 337.3 ng/mL

## 2017-05-23 MED ORDER — ATORVASTATIN CALCIUM 40 MG PO TABS: 40.0000 mg | ORAL_TABLET | Freq: Every day | ORAL | 1 refills | Status: DC

## 2017-05-23 MED ORDER — CYANOCOBALAMIN 100 MCG PO TABS: 100.0000 ug | ORAL_TABLET | Freq: Every day | ORAL | 0 refills | Status: DC

## 2017-05-23 MED ORDER — CETIRIZINE HCL 10 MG PO TABS: 10.0000 mg | ORAL_TABLET | Freq: Every day | ORAL | 1 refills | Status: AC

## 2017-05-23 MED ORDER — LEVOFLOXACIN 750 MG PO TABS: 750.0000 mg | ORAL_TABLET | Freq: Every day | ORAL | 0 refills | Status: AC

## 2017-05-23 MED ORDER — TAMSULOSIN HCL 0.4 MG PO CAPS: 0.8000 mg | ORAL_CAPSULE | Freq: Every day | ORAL | 1 refills | Status: DC

## 2017-05-23 NOTE — Discharge Summary (Signed)
Physician Discharge Summary  [name] HUT:[id number] DOB: [birth year] DOA: 05/19/2017  PCP: Boykin Nearing, MD  Admit date: 05/19/2017 Discharge date: 05/23/2017  Admitted From: Home  Disposition:  Home   Recommendations for Outpatient Follow-up:  1. Follow up with PCP in 1-2 weeks 2. Please obtain BMP/CBC in one week    Discharge Condition: stable.  CODE STATUS: full code.  Diet recommendation: Heart Healthy  Brief/Interim Summary: HPI: Wesley Madole Whartonis a 61 y.o.malewith history of COPD, sleep apnea, sick sinus syndrome status post pacemaker placement was brought to the ER patient was found to be increasingly lethargic and confused. As per patient's wife provided the history patient was doing fine when she left home for work around 2 PM yesterday. Patient's daughter noticed that patient was increasingly confused and on patient and wife arrival at 5:30 PM patient was slumped to the right side with drowsy and lethargic.  ED Course:In the ER CT head was unremarkable. Patient continues to be lethargic. Patient is being admitted for further observation. Previously patient has had staring episodes and in January was seen by neurologist and at that time it was attributed to hypotensive episodes. EEG was unremarkable. As per the family patient has not been on any new medications.    Assessment & Plan:   Principal Problem:   Acute encephalopathy Active Problems:   Essential hypertension   COPD mixed type (HCC)   SSS (sick sinus syndrome) (HCC)   Obstructive sleep apnea   1-Acute encephalopathy, lethargic.  Repeat ct head, report left arm tingling.  Neurology consulted. Unlikely seizure.  EEG negative.  TSH normal, RPR negative, B 12 normal.  Appreciate neurology; evaluation. Orthostatic negative.  This might be related to PNA.  Back to baseline.   2-near syncope ?  Enzymes negative IV fluids.  Orthostatic negative  3-PNA; report cough,. Chest x ray with  PNA.  Continue with  Levaquin. Plan to discharge on 5 more days . Needs repeat chest xray in 4 to 6 weeks.   4-HTN; Hold lisinopril at discharge    Discharge Diagnoses:  Principal Problem:   Acute encephalopathy Active Problems:   Essential hypertension   COPD mixed type (HCC)   SSS (sick sinus syndrome) (HCC)   Obstructive sleep apnea    Discharge Instructions  Discharge Instructions    Diet - low sodium heart healthy    Complete by:  As directed    Increase activity slowly    Complete by:  As directed      Allergies as of 05/23/2017      Reactions   Penicillins Swelling   Has patient had a PCN reaction causing immediate rash, facial/tongue/throat swelling, SOB or lightheadedness with hypotension: unknown Has patient had a PCN reaction causing severe rash involving mucus membranes or skin necrosis: unknown Has patient had a PCN reaction that required hospitalization : unknown Has patient had a PCN reaction occurring within the last 10 years: no If all of the above answers are "NO", then may proceed with Cephalosporin use.      Medication List    STOP taking these medications   lisinopril 40 MG tablet Commonly known as:  PRINIVIL,ZESTRIL   meloxicam 15 MG tablet Commonly known as:  MOBIC     TAKE these medications   ACCU-CHEK AVIVA PLUS w/Device Kit 1 Device by Does not apply route 4 (four) times daily.   albuterol 108 (90 Base) MCG/ACT inhaler Commonly known as:  PROVENTIL HFA;VENTOLIN HFA Inhale 2 puffs into the lungs every  6 (six) hours as needed for wheezing or shortness of breath.   aspirin 325 MG tablet Take 325 mg by mouth daily.   atorvastatin 40 MG tablet Commonly known as:  LIPITOR Take 1 tablet (40 mg total) by mouth daily at 6 PM.   cetirizine 10 MG tablet Commonly known as:  ZYRTEC Take 1 tablet (10 mg total) by mouth daily.   cyanocobalamin 100 MCG tablet Take 1 tablet (100 mcg total) by mouth daily. Start taking on:  05/24/2017    escitalopram 20 MG tablet Commonly known as:  LEXAPRO Take 1 tablet (20 mg total) by mouth daily.   fluticasone furoate-vilanterol 100-25 MCG/INH Aepb Commonly known as:  BREO ELLIPTA Inhale 1 puff into the lungs daily.   glucose blood test strip Commonly known as:  ACCU-CHEK AVIVA Use as instructed   levofloxacin 750 MG tablet Commonly known as:  LEVAQUIN Take 1 tablet (750 mg total) by mouth daily.   pantoprazole 40 MG tablet Commonly known as:  PROTONIX Take 1 tablet (40 mg total) by mouth daily.   tamsulosin 0.4 MG Caps capsule Commonly known as:  FLOMAX Take 2 capsules (0.8 mg total) by mouth daily after supper. 0.4 mg by mouth nightly after supper for two weeks, then 0.8 mg nightly   Vitamin D3 2000 units Tabs Take 2,000 Units by mouth daily. What changed:  when to take this   Wrist Splint/Elastic Right Lg Misc 1 each by Does not apply route at bedtime.   Wrist Splint/Elastic Left Lg Misc 1 each by Does not apply route daily.       Allergies  Allergen Reactions  . Penicillins Swelling    Has patient had a PCN reaction causing immediate rash, facial/tongue/throat swelling, SOB or lightheadedness with hypotension: unknown Has patient had a PCN reaction causing severe rash involving mucus membranes or skin necrosis: unknown Has patient had a PCN reaction that required hospitalization : unknown Has patient had a PCN reaction occurring within the last 10 years: no If all of the above answers are "NO", then may proceed with Cephalosporin use.     Consultations:  Neurology    Procedures/Studies: Dg Chest 2 View  Result Date: 05/21/2017 CLINICAL DATA:  Cough and fever. History of COPD, hypertension, former smoker. EXAM: CHEST  2 VIEW COMPARISON:  PA and lateral chest x-ray of Apr 18, 2017 FINDINGS: The lungs are well-expanded. There are bullous changes in the right apex which are stable. There is patchy increased density in the right middle lobe which is new. The  left lung is clear. The heart and pulmonary vascularity are normal. The permanent pacemaker is in stable position. The bony thorax exhibits no acute abnormality. IMPRESSION: Right middle lobe atelectasis or pneumonia. Underlying bullous emphysema. Followup PA and lateral chest X-ray is recommended in 3-4 weeks following trial of antibiotic therapy to ensure resolution and exclude underlying malignancy. Electronically Signed   By: David  Martinique M.D.   On: 05/21/2017 15:11   Dg Shoulder Right  Result Date: 05/19/2017 CLINICAL DATA:  Right shoulder pain for a while. EXAM: RIGHT SHOULDER - 2+ VIEW COMPARISON:  None. FINDINGS: There is no evidence of fracture or dislocation. Small glenohumeral osteophytes consistent with mild osteoarthritis. No evidence of inflammatory arthropathy. Soft tissues are unremarkable. IMPRESSION: Mild glenohumeral osteoarthritis.  No acute osseous abnormality. Electronically Signed   By: Jeb Levering M.D.   On: 05/19/2017 21:00   Ct Head Wo Contrast  Result Date: 05/21/2017 CLINICAL DATA:  More lethargic than typical.  Evaluate for infarct. EXAM: CT HEAD WITHOUT CONTRAST TECHNIQUE: Contiguous axial images were obtained from the base of the skull through the vertex without intravenous contrast. COMPARISON:  05/19/2017 FINDINGS: Brain: There is no evidence for acute hemorrhage, hydrocephalus, mass lesion, or abnormal extra-axial fluid collection. No definite CT evidence for acute infarction. Diffuse loss of parenchymal volume is consistent with atrophy. Patchy low attenuation in the deep hemispheric and periventricular white matter is nonspecific, but likely reflects chronic microvascular ischemic demyelination. No change lacunar infarct left basal ganglia. Vascular: Atherosclerotic calcification is visualized in the intracranial segments of the internal carotid arteries. No dense MCA sign. Major dural sinuses are unremarkable. Skull: No evidence for fracture. No worrisome lytic or  sclerotic lesion. Sinuses/Orbits: The visualized paranasal sinuses and mastoid air cells are clear. Visualized portions of the globes and intraorbital fat are unremarkable. Other: None. IMPRESSION: 1. Stable.  No acute intracranial abnormality. 2. Atrophy with chronic small vessel white matter ischemic disease. Electronically Signed   By: Misty Stanley M.D.   On: 05/21/2017 15:00   Ct Head Code Stroke W/o Cm  Result Date: 05/19/2017 CLINICAL DATA:  Code stroke.  Headache and right-sided numbness EXAM: CT HEAD WITHOUT CONTRAST TECHNIQUE: Contiguous axial images were obtained from the base of the skull through the vertex without intravenous contrast. COMPARISON:  Head CT 12/19/2016 FINDINGS: Brain: No mass lesion, intraparenchymal hemorrhage or extra-axial collection. No evidence of acute cortical infarct. 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 Vascular: No hyperdense vessel or unexpected calcification. Skull: Normal visualized skull base, calvarium and extracranial soft tissues. Sinuses/Orbits: No sinus fluid levels or advanced mucosal thickening. No mastoid effusion. Normal orbits. ASPECTS [facility] Stroke Program Early CT Score) - Ganglionic level infarction (caudate, lentiform nuclei, internal capsule, insula, M1-M3 cortex): 7 - Supraganglionic infarction (M4-M6 cortex): 3 Total score (0-10 with 10 being normal): 10 IMPRESSION: 1. No acute intracranial abnormality. Old left basal ganglia lacunar infarct. 2. ASPECTS is 10. Electronically Signed   By: Ulyses Jarred M.D.   On: 05/19/2017 19:34    (Echo, Carotid, EGD, Colonoscopy, ERCP)    Subjective:   Discharge Exam: Vitals:   05/23/17 0628 05/23/17 0633  BP: 126/76 (!) 112/57  Pulse: (!) 59 74  Resp: 18 16  Temp: 99 F (37.2 C) 98.4 F (36.9 C)   Vitals:   05/22/17 1440 05/22/17 2245 05/23/17 0628 05/23/17 0633  BP: 122/76 117/80 126/76 (!) 112/57  Pulse:  70 (!) 59 74  Resp: '20 18 18 16  ' Temp: 98.3 F (36.8 C) 98.7 F (37.1  C) 99 F (37.2 C) 98.4 F (36.9 C)  TempSrc: Oral Oral Oral Oral  SpO2: 96% 98% 93% 95%  Weight:      Height:        General: Pt is alert, awake, not in acute distress Cardiovascular: RRR, S1/S2 +, no rubs, no gallops Respiratory: CTA bilaterally, no wheezing, no rhonchi Abdominal: Soft, NT, ND, bowel sounds + Extremities: no edema, no cyanosis    The results of significant diagnostics from this hospitalization (including imaging, microbiology, ancillary and laboratory) are listed below for reference.     Microbiology: No results found for this or any previous visit (from the past 240 hour(s)).   Labs: BNP (last 3 results) No results for input(s): BNP in the last 8760 hours. Basic Metabolic Panel:  Recent Labs Lab 05/19/17 2004 05/19/17 2015 05/20/17 0532 05/22/17 0413  NA 138 139 137 137  K 3.4* 3.3* 3.6 3.9  CL 104 101 106 106  CO2 26  --  25 22  GLUCOSE 113* 108* 180* 96  BUN '16 17 14 15  ' CREATININE 0.91 1.00 0.90 0.92  CALCIUM 9.2  --  8.7* 8.4*   Liver Function Tests:  Recent Labs Lab 05/19/17 2004  AST 24  ALT 36  ALKPHOS 104  BILITOT 0.2*  PROT 7.4  ALBUMIN 4.3   No results for input(s): LIPASE, AMYLASE in the last 168 hours.  Recent Labs Lab 05/20/17 0152  AMMONIA 38*   CBC:  Recent Labs Lab 05/19/17 2004 05/19/17 2015 05/20/17 0532 05/22/17 0413  WBC 4.6  --  3.6* 3.8*  NEUTROABS 2.8  --   --   --   HGB 14.3 15.6 13.1 13.4  HCT 43.0 46.0 40.5 41.0  MCV 82.2  --  83.3 82.8  PLT 137*  --  129* 135*   Cardiac Enzymes:  Recent Labs Lab 05/21/17 1713 05/21/17 2245 05/22/17 0414  TROPONINI <0.03 <0.03 <0.03   BNP: Invalid input(s): POCBNP CBG:  Recent Labs Lab 05/22/17 1210 05/22/17 1702 05/22/17 2251 05/23/17 0820 05/23/17 1204  GLUCAP 132* 111* 106* 170* 193*   D-Dimer No results for input(s): DDIMER in the last 72 hours. Hgb A1c No results for input(s): HGBA1C in the last 72 hours. Lipid Profile No results  for input(s): CHOL, HDL, LDLCALC, TRIG, CHOLHDL, LDLDIRECT in the last 72 hours. Thyroid function studies No results for input(s): TSH, T4TOTAL, T3FREE, THYROIDAB in the last 72 hours.  Invalid input(s): FREET3 Anemia work up No results for input(s): VITAMINB12, FOLATE, FERRITIN, TIBC, IRON, RETICCTPCT in the last 72 hours. Urinalysis    Component Value Date/Time   COLORURINE YELLOW 05/19/2017 2007   APPEARANCEUR CLEAR 05/19/2017 2007   LABSPEC 1.016 05/19/2017 2007   PHURINE 6.0 05/19/2017 2007   GLUCOSEU NEGATIVE 05/19/2017 2007   HGBUR NEGATIVE 05/19/2017 2007   BILIRUBINUR NEGATIVE 05/19/2017 2007   BILIRUBINUR Negative 07/22/2016 [place] 05/19/2017 2007   PROTEINUR NEGATIVE 05/19/2017 2007   UROBILINOGEN 0.2 07/22/2016 1142   NITRITE NEGATIVE 05/19/2017 2007   LEUKOCYTESUR NEGATIVE 05/19/2017 2007   Sepsis Labs Invalid input(s): PROCALCITONIN,  WBC,  LACTICIDVEN Microbiology No results found for this or any previous visit (from the past 240 hour(s)).   Time coordinating discharge: Over 30 minutes  SIGNED:   Elmarie Shiley, MD  [facility] 05/23/2017, 12:20 PM Pager   If 7PM-7AM, please contact night-coverage [email or web address] Password [email or web address]

## 2017-05-25 MED FILL — MELOXICAM 15 MG TABLET: 15 | 30 days supply | Qty: 30 | Fill #1

## 2017-05-25 MED FILL — TAMSULOSIN HCL 0.4 MG CAP: 0.4 | 30 days supply | Qty: 60 | Fill #0

## 2017-05-25 MED FILL — ACCU-CHEK AVIVA PLUS TEST S: 25 days supply | Qty: 100 | Fill #2

## 2017-05-25 MED FILL — SERTRALINE HCL 100 MG TAB: 100 | 30 days supply | Qty: 30 | Fill #3

## 2017-05-25 MED FILL — ATORVASTATIN 40 MG TABLET: 40 | 30 days supply | Qty: 30 | Fill #1

## 2017-05-25 MED FILL — ALL DAY ALLERGY 10 MG TAB: 10 | 30 days supply | Qty: 30 | Fill #0

## 2017-05-25 MED FILL — ACCU-CHEK SOFTCLIX LANCETS: 25 days supply | Qty: 100 | Fill #1

## 2017-05-25 MED FILL — LISINOPRIL 40 MG TABLET: 40 | 30 days supply | Qty: 30 | Fill #1

## 2017-06-05 ENCOUNTER — Telehealth: Payer: Self-pay | Admitting: Pulmonary Disease

## 2017-06-05 NOTE — Telephone Encounter (Signed)
lmomtcb x 1 for the pt 

## 2017-06-08 NOTE — Telephone Encounter (Signed)
Left message for patient to call back  

## 2017-06-09 NOTE — Telephone Encounter (Signed)
Spoke with pt. I have reviewed his chart and discharge summary. He was instructed to follow up with his PCP in 1-2 weeks and have a CXR in 4-6 weeks. There isn't any documentation about the pt needing to see [place] for a HFU. Pt states that she has a pending OV with his PCP and will just have her order his CXR. Advised pt that if there is any issues with this to just let us know. Nothing further was needed at this time.

## 2017-06-30 ENCOUNTER — Ambulatory Visit: Payer: Medicaid Other | Admitting: Family Medicine

## 2017-07-03 ENCOUNTER — Ambulatory Visit: Payer: Medicaid Other | Attending: Family Medicine | Admitting: Family Medicine

## 2017-07-03 ENCOUNTER — Encounter: Payer: Self-pay | Admitting: Family Medicine

## 2017-07-03 ENCOUNTER — Telehealth: Payer: Self-pay | Admitting: Family Medicine

## 2017-07-03 VITALS — BP 144/83 | HR 65 | Temp 97.8°F | Ht 74.0 in | Wt 214.2 lb

## 2017-07-03 DIAGNOSIS — J440 Chronic obstructive pulmonary disease with acute lower respiratory infection: Secondary | ICD-10-CM | POA: Diagnosis not present

## 2017-07-03 DIAGNOSIS — Z79899 Other long term (current) drug therapy: Secondary | ICD-10-CM | POA: Insufficient documentation

## 2017-07-03 DIAGNOSIS — E119 Type 2 diabetes mellitus without complications: Secondary | ICD-10-CM

## 2017-07-03 DIAGNOSIS — F419 Anxiety disorder, unspecified: Secondary | ICD-10-CM

## 2017-07-03 DIAGNOSIS — N400 Enlarged prostate without lower urinary tract symptoms: Secondary | ICD-10-CM | POA: Diagnosis not present

## 2017-07-03 DIAGNOSIS — F329 Major depressive disorder, single episode, unspecified: Secondary | ICD-10-CM | POA: Insufficient documentation

## 2017-07-03 DIAGNOSIS — F32A Depression, unspecified: Secondary | ICD-10-CM

## 2017-07-03 DIAGNOSIS — Z950 Presence of cardiac pacemaker: Secondary | ICD-10-CM | POA: Insufficient documentation

## 2017-07-03 DIAGNOSIS — H60541 Acute eczematoid otitis externa, right ear: Secondary | ICD-10-CM | POA: Insufficient documentation

## 2017-07-03 DIAGNOSIS — M62838 Other muscle spasm: Secondary | ICD-10-CM | POA: Diagnosis not present

## 2017-07-03 DIAGNOSIS — I10 Essential (primary) hypertension: Secondary | ICD-10-CM

## 2017-07-03 DIAGNOSIS — R42 Dizziness and giddiness: Secondary | ICD-10-CM

## 2017-07-03 DIAGNOSIS — Z7982 Long term (current) use of aspirin: Secondary | ICD-10-CM | POA: Insufficient documentation

## 2017-07-03 DIAGNOSIS — J189 Pneumonia, unspecified organism: Secondary | ICD-10-CM | POA: Insufficient documentation

## 2017-07-03 DIAGNOSIS — M25532 Pain in left wrist: Secondary | ICD-10-CM | POA: Diagnosis not present

## 2017-07-03 DIAGNOSIS — I495 Sick sinus syndrome: Secondary | ICD-10-CM | POA: Diagnosis not present

## 2017-07-03 DIAGNOSIS — Z8673 Personal history of transient ischemic attack (TIA), and cerebral infarction without residual deficits: Secondary | ICD-10-CM | POA: Diagnosis not present

## 2017-07-03 DIAGNOSIS — M25531 Pain in right wrist: Secondary | ICD-10-CM | POA: Diagnosis not present

## 2017-07-03 DIAGNOSIS — Z87891 Personal history of nicotine dependence: Secondary | ICD-10-CM | POA: Diagnosis not present

## 2017-07-03 DIAGNOSIS — J181 Lobar pneumonia, unspecified organism: Secondary | ICD-10-CM

## 2017-07-03 LAB — GLUCOSE, POCT (MANUAL RESULT ENTRY): POC GLUCOSE: 166 mg/dL — AB (ref 70–99)

## 2017-07-03 LAB — POCT GLYCOSYLATED HEMOGLOBIN (HGB A1C): HEMOGLOBIN A1C: 6.9

## 2017-07-03 MED ORDER — ACETIC ACID 2 % OT SOLN: 4.0000 [drp] | Freq: Three times a day (TID) | OTIC | 0 refills | Status: DC

## 2017-07-03 MED ORDER — CYCLOBENZAPRINE HCL 10 MG PO TABS: 10.0000 mg | ORAL_TABLET | Freq: Three times a day (TID) | ORAL | 0 refills | Status: DC | PRN

## 2017-07-03 MED ORDER — NAPROXEN 500 MG PO TABS: 500.0000 mg | ORAL_TABLET | Freq: Two times a day (BID) | ORAL | 0 refills | Status: DC

## 2017-07-03 MED ORDER — LISINOPRIL 10 MG PO TABS: 10.0000 mg | ORAL_TABLET | Freq: Every day | ORAL | 3 refills | Status: DC

## 2017-07-03 MED ORDER — ESCITALOPRAM OXALATE 20 MG PO TABS: 20.0000 mg | ORAL_TABLET | Freq: Every day | ORAL | 5 refills | Status: DC

## 2017-07-03 MED FILL — LISINOPRIL 10 MG TABLET: 10 | 90 days supply | Qty: 90 | Fill #0

## 2017-07-03 MED FILL — CYCLOBENZAPRINE 10 MG TAB: 10 | 7 days supply | Qty: 21 | Fill #0

## 2017-07-03 MED FILL — NAPROXEN 500 MG TABLET: 500 | 7 days supply | Qty: 14 | Fill #0

## 2017-07-03 MED FILL — ESCITALOPRAM 20 MG TABLET: 20 | 30 days supply | Qty: 30 | Fill #1

## 2017-07-03 NOTE — Telephone Encounter (Signed)
Pt wife needs a letter for work stating that she was taking care of husband once released from Hospital.

## 2017-07-03 NOTE — Progress Notes (Signed)
Subjective:  Patient ID: Wesley [name], male    DOB: 09/01/1956  Age: 61 y.o. MRN: [id number]  CC: Hypertension and Diabetes   HPI Wesley [name] hx of CVA, HTN, DM, hx of substance abuse, COPD,  Sick sinus syndrome s/p pacemaker, BPH he presents alone   1. HFU pneumonia: he was hospitalized at [facility] from 6/5-05/23/17 for altered mental status. His work up including tox screen, EEG and CT head were negative. His chest x-ray revealed evidence of pneumonia. He was started with Levaquin.  He reports feeling well today. He reports he completed Levaquin course. He reports he has a repeat CXR earlier in the week at the [place] where he plans to transition his primary care.   He reports wrist pain and R ear irritation. He request mediation refills today.   Social History  Substance Use Topics  . Smoking status: Former Smoker    Packs/day: 1.00    Years: 40.00    Types: Cigarettes    Quit date: 08/06/2016  . Smokeless tobacco: Never Used     Comment: pt still smokes every now and again  . Alcohol use No     Comment: hx of ETOH abuse from 2001-2016     Outpatient Medications Prior to Visit  Medication Sig Dispense Refill  . albuterol (PROVENTIL HFA;VENTOLIN HFA) 108 (90 Base) MCG/ACT inhaler Inhale 2 puffs into the lungs every 6 (six) hours as needed for wheezing or shortness of breath. 1 Inhaler 2  . aspirin 325 MG tablet Take 325 mg by mouth daily.    Marland Kitchen atorvastatin (LIPITOR) 40 MG tablet Take 1 tablet (40 mg total) by mouth daily at 6 PM. 30 tablet 1  . Blood Glucose Monitoring Suppl (ACCU-CHEK AVIVA PLUS) w/Device KIT 1 Device by Does not apply route 4 (four) times daily. 1 kit 0  . cetirizine (ZYRTEC) 10 MG tablet Take 1 tablet (10 mg total) by mouth daily. 30 tablet 1  . Cholecalciferol (VITAMIN D3) 2000 UNITS TABS Take 2,000 Units by mouth daily. (Patient taking differently: Take 2,000 Units by mouth every morning. ) 30 tablet 11  . Elastic Bandages & Supports (WRIST  SPLINT/ELASTIC LEFT LG) MISC 1 each by Does not apply route daily. 1 each 0  . Elastic Bandages & Supports (WRIST SPLINT/ELASTIC RIGHT LG) MISC 1 each by Does not apply route at bedtime. 1 each 0  . escitalopram (LEXAPRO) 20 MG tablet Take 1 tablet (20 mg total) by mouth daily. 30 tablet 5  . fluticasone furoate-vilanterol (BREO ELLIPTA) 100-25 MCG/INH AEPB Inhale 1 puff into the lungs daily. 30 each 5  . glucose blood (ACCU-CHEK AVIVA) test strip Use as instructed 100 each 12  . pantoprazole (PROTONIX) 40 MG tablet Take 1 tablet (40 mg total) by mouth daily. 30 tablet 5  . tamsulosin (FLOMAX) 0.4 MG CAPS capsule Take 2 capsules (0.8 mg total) by mouth daily after supper. 0.4 mg by mouth nightly after supper for two weeks, then 0.8 mg nightly 60 capsule 1  . vitamin B-12 100 MCG tablet Take 1 tablet (100 mcg total) by mouth daily. 30 tablet 0   No facility-administered medications prior to visit.     ROS Review of Systems  Constitutional: Negative for chills, fatigue, fever and unexpected weight change.  HENT: Positive for hearing loss.   Eyes: Negative for visual disturbance.  Respiratory: Negative for cough and shortness of breath.   Cardiovascular: Negative for chest pain, palpitations and leg swelling.  Gastrointestinal: Negative for  abdominal pain, blood in stool, constipation, diarrhea, nausea and vomiting.  Endocrine: Negative for polydipsia, polyphagia and polyuria.  Genitourinary: Negative for dysuria.       Erectile dysfunction   Musculoskeletal: Negative for arthralgias, back pain, gait problem, myalgias and neck pain.  Skin: Negative for rash.  Allergic/Immunologic: Negative for immunocompromised state.  Neurological: Positive for tremors, light-headedness and numbness. Negative for dizziness, seizures, syncope, facial asymmetry, speech difficulty, weakness and headaches.  Hematological: Negative for adenopathy. Does not bruise/bleed easily.  Psychiatric/Behavioral: Positive  for dysphoric mood. Negative for sleep disturbance and suicidal ideas. The patient is nervous/anxious.     Objective:  BP (!) 144/83   Pulse 65   Temp 97.8 F (36.6 C) (Oral)   Ht _0  (1.88 m)   Wt 214 lb 3.2 oz (97.2 kg)   SpO2 95%   BMI 27.50 kg/m   BP/Weight 07/03/2017 06/17/2594 05/18/8755  Systolic BP 433 295 -  Diastolic BP 83 57 -  Wt. (Lbs) 214.2 - 206.79  BMI 27.5 - 26.55   Physical Exam  Constitutional: He appears well-developed and well-nourished. No distress.  HENT:  Head: Normocephalic and atraumatic.  Ears:  Neck: Normal range of motion. Neck supple.  Cardiovascular: Normal rate, regular rhythm, normal heart sounds and intact distal pulses.   Pulmonary/Chest: Effort normal and breath sounds normal.  Musculoskeletal: He exhibits no edema.       Right wrist: He exhibits tenderness (radial wrist ).       Left wrist: He exhibits tenderness (radial wrist).  Neurological: He is alert.  Skin: Skin is warm and dry. No rash noted. No erythema.  Psychiatric: He has a normal mood and affect.   Lab Results  Component Value Date   HGBA1C 6.9 07/03/2017   CBG 166   Depression screen [facility] 2/9 03/30/2017 02/16/2017 01/30/2017  Decreased Interest _1 Down, Depressed, Hopeless _2 PHQ - 2 Score _3 Altered sleeping _4 Tired, decreased energy _5 Change in appetite _6 Feeling bad or failure about yourself  _7 Trouble concentrating _8 Moving slowly or fidgety/restless _9 Suicidal thoughts 0 1 1  PHQ-9 Score _10 Some recent data might be hidden   GAD 7 : Generalized Anxiety Score 03/30/2017 02/16/2017 01/30/2017 01/19/2017  Nervous, Anxious, on Edge _11 Control/stop worrying _12 Worry too much - different things _13 Trouble relaxing _14 Restless _15 Easily annoyed or irritable _16 Afraid - awful might happen _17 Total GAD 7 Score _18 Assessment & Plan:  There are no diagnoses linked to  this encounter. Wesley [name] was seen today for hypertension and diabetes.  Diagnoses and all orders for this visit:  Controlled type 2 diabetes mellitus without complication, without long-term current use of insulin (HCC) -     POCT glucose (manual entry) -     POCT glycosylated hemoglobin (Hb A1C) -     POCT UA - Microalbumin -     Ambulatory referral to Ophthalmology  Anxiety and depression -     escitalopram (LEXAPRO) 20 MG tablet; Take 1 tablet (20 mg total) by mouth daily.  Neck muscle spasm -     DG Cervical Spine  2 or 3 views; Future -     naproxen (NAPROSYN) 500 MG tablet; Take 1 tablet (500 mg total) by mouth 2 (two) times daily with a meal. For 5-7 days for wrist pain and swelling and neck pain -     cyclobenzaprine (FLEXERIL) 10 MG tablet; Take 1 tablet (10 mg total) by mouth 3 (three) times daily as needed for muscle spasms.  Pneumonia of right middle lobe due to infectious organism [facility]) -     DG Chest 2 View; Future  Essential hypertension -     lisinopril (PRINIVIL,ZESTRIL) 10 MG tablet; Take 1 tablet (10 mg total) by mouth daily.  Pain in both wrists -     Ambulatory referral to Sports Medicine -     naproxen (NAPROSYN) 500 MG tablet; Take 1 tablet (500 mg total) by mouth 2 (two) times daily with a meal. For 5-7 days for wrist pain and swelling and neck pain  Acute eczematoid otitis externa of right ear -     acetic acid (VOSOL) 2 % otic solution; Place 4 drops into the right ear 3 (three) times daily. For 5 days    No orders of the defined types were placed in this encounter.   Follow-up: Return in about 3 months (around 10/03/2017) for diabetes and HTN.   Boykin Nearing MD

## 2017-07-03 NOTE — Telephone Encounter (Signed)
Patient was seen in the office today by provider and stated that he forgot to mention to PCP that he needs a cane and walker with wheels because of the dizziness that he experiences (when standing). Please follow up.  Thank you.

## 2017-07-03 NOTE — Patient Instructions (Addendum)
Wesley [name] was seen today for hypertension and diabetes.  Diagnoses and all orders for this visit:  Controlled type 2 diabetes mellitus without complication, without long-term current use of insulin (HCC) -     POCT glucose (manual entry) -     POCT glycosylated hemoglobin (Hb A1C)  Anxiety and depression -     escitalopram (LEXAPRO) 20 MG tablet; Take 1 tablet (20 mg total) by mouth daily.  Neck muscle spasm -     DG Cervical Spine 2 or 3 views; Future -     naproxen (NAPROSYN) 500 MG tablet; Take 1 tablet (500 mg total) by mouth 2 (two) times daily with a meal. For 5-7 days for wrist pain and swelling and neck pain -     cyclobenzaprine (FLEXERIL) 10 MG tablet; Take 1 tablet (10 mg total) by mouth 3 (three) times daily as needed for muscle spasms.  Pneumonia of right middle lobe due to infectious organism [facility]) -     DG Chest 2 View; Future  Essential hypertension -     lisinopril (PRINIVIL,ZESTRIL) 10 MG tablet; Take 1 tablet (10 mg total) by mouth daily.  Pain in both wrists -     Ambulatory referral to Sports Medicine -     naproxen (NAPROSYN) 500 MG tablet; Take 1 tablet (500 mg total) by mouth 2 (two) times daily with a meal. For 5-7 days for wrist pain and swelling and neck pain  Acute eczematoid otitis externa of right ear -     acetic acid (VOSOL) 2 % otic solution; Place 4 drops into the right ear 3 (three) times daily. For 5 days    Please complete x-ray of neck and chest  You can pick up lexapro 20 mg at pharmacy Do not take zoloft (sertaline)   F/u in 3 month for diabetes and flu shot, sooner if needed   Dr. Adrian Blackwater   Cervical Strain and Sprain Rehab Ask your health care provider which exercises are safe for you. Do exercises exactly as told by your health care provider and adjust them as directed. It is normal to feel mild stretching, pulling, tightness, or discomfort as you do these exercises, but you should stop right away if you feel sudden pain or your pain  gets worse.Do not begin these exercises until told by your health care provider. Stretching and range of motion exercises These exercises warm up your muscles and joints and improve the movement and flexibility of your neck. These exercises also help to relieve pain, numbness, and tingling. Exercise A: Cervical side bend  1. Using good posture, sit on a stable chair or stand up. 2. Without moving your shoulders, slowly tilt your left / right ear to your shoulder until you feel a stretch in your neck muscles. You should be looking straight ahead. 3. Hold for __________ seconds. 4. Repeat with the other side of your neck. Repeat __________ times. Complete this exercise __________ times a day. Exercise B: Cervical rotation  1. Using good posture, sit on a stable chair or stand up. 2. Slowly turn your head to the side as if you are looking over your left / right shoulder. ? Keep your eyes level with the ground. ? Stop when you feel a stretch along the side and the back of your neck. 3. Hold for __________ seconds. 4. Repeat this by turning to your other side. Repeat __________ times. Complete this exercise __________ times a day. Exercise C: Thoracic extension and pectoral stretch 1. Roll a  towel or a small blanket so it is about 4 inches (10 cm) in diameter. 2. Lie down on your back on a firm surface. 3. Put the towel lengthwise, under your spine in the middle of your back. It should not be not under your shoulder blades. The towel should line up with your spine from your middle back to your lower back. 4. Put your hands behind your head and let your elbows fall out to your sides. 5. Hold for __________ seconds. Repeat __________ times. Complete this exercise __________ times a day. Strengthening exercises These exercises build strength and endurance in your neck. Endurance is the ability to use your muscles for a long time, even after your muscles get tired. Exercise D: Upper cervical  flexion, isometric 1. Lie on your back with a thin pillow behind your head and a small rolled-up towel under your neck. 2. Gently tuck your chin toward your chest and nod your head down to look toward your feet. Do not lift your head off the pillow. 3. Hold for __________ seconds. 4. Release the tension slowly. Relax your neck muscles completely before you repeat this exercise. Repeat __________ times. Complete this exercise __________ times a day. Exercise E: Cervical extension, isometric  1. Stand about 6 inches (15 cm) away from a wall, with your back facing the wall. 2. Place a soft object, about 6-8 inches (15-20 cm) in diameter, between the back of your head and the wall. A soft object could be a small pillow, a ball, or a folded towel. 3. Gently tilt your head back and press into the soft object. Keep your jaw and forehead relaxed. 4. Hold for __________ seconds. 5. Release the tension slowly. Relax your neck muscles completely before you repeat this exercise. Repeat __________ times. Complete this exercise __________ times a day. Posture and body mechanics  Body mechanics refers to the movements and positions of your body while you do your daily activities. Posture is part of body mechanics. Good posture and healthy body mechanics can help to relieve stress in your body's tissues and joints. Good posture means that your spine is in its natural S-curve position (your spine is neutral), your shoulders are pulled back slightly, and your head is not tipped forward. The following are general guidelines for applying improved posture and body mechanics to your everyday activities. Standing  When standing, keep your spine neutral and keep your feet about hip-width apart. Keep a slight bend in your knees. Your ears, shoulders, and hips should line up.  When you do a task in which you stand in one place for a long time, place one foot up on a stable object that is 2-4 inches (5-10 cm) high, such  as a footstool. This helps keep your spine neutral. Sitting   When sitting, keep your spine neutral and your keep feet flat on the floor. Use a footrest, if necessary, and keep your thighs parallel to the floor. Avoid rounding your shoulders, and avoid tilting your head forward.  When working at a desk or a computer, keep your desk at a height where your hands are slightly lower than your elbows. Slide your chair under your desk so you are close enough to maintain good posture.  When working at a computer, place your monitor at a height where you are looking straight ahead and you do not have to tilt your head forward or downward to look at the screen. Resting When lying down and resting, avoid positions that are  most painful for you. Try to support your neck in a neutral position. You can use a contour pillow or a small rolled-up towel. Your pillow should support your neck but not push on it. This information is not intended to replace advice given to you by your health care provider. Make sure you discuss any questions you have with your health care provider. Document Released: 12/01/2005 Document Revised: 08/07/2016 Document Reviewed: 11/07/2015 Elsevier Interactive Patient Education  [employer].

## 2017-07-07 DIAGNOSIS — J189 Pneumonia, unspecified organism: Secondary | ICD-10-CM | POA: Insufficient documentation

## 2017-07-07 DIAGNOSIS — J181 Lobar pneumonia, unspecified organism: Secondary | ICD-10-CM

## 2017-07-07 DIAGNOSIS — H60541 Acute eczematoid otitis externa, right ear: Secondary | ICD-10-CM | POA: Insufficient documentation

## 2017-07-07 DIAGNOSIS — M62838 Other muscle spasm: Secondary | ICD-10-CM | POA: Insufficient documentation

## 2017-07-07 MED ORDER — HUGO ROLLING WALKER BASIC MISC: 1.0000 | Freq: Every day | 0 refills | Status: DC | PRN

## 2017-07-07 MED ORDER — QUAD CANE MISC: 1.0000 | 0 refills | Status: DC | PRN

## 2017-07-07 NOTE — Assessment & Plan Note (Signed)
Well controlled Referral to opthalmology

## 2017-07-07 NOTE — Assessment & Plan Note (Signed)
Chronic symptoms Treating with lexapro 20 mg daily

## 2017-07-07 NOTE — Assessment & Plan Note (Signed)
Due to arthritis Referral back to sports medicine for injections Naproxen for pain

## 2017-07-07 NOTE — Assessment & Plan Note (Signed)
Treated with Levaquin Repeat CXR ordered

## 2017-07-07 NOTE — Telephone Encounter (Signed)
Please inform patient that the work note for his wife is ready for pick up  Rx for cane and rolling walker is also ready for pick up  [facility] medical supply stores  [facility]  [address], [place], [place] [address] [phone]   [facility] [address], [place], [place] [address]  [phone]   [facility] [facility] [place], [place], [place] [address]   [phone] ext. [phone]

## 2017-07-08 NOTE — Telephone Encounter (Signed)
Pt. Called requesting to speak with PCP regarding a letter that he needs. Pt. States that his PCP had wrote a letter for him last time he was at the hospital. Please f/u with pt.

## 2017-07-08 NOTE — Telephone Encounter (Signed)
Will route to PCP 

## 2017-07-08 NOTE — Telephone Encounter (Signed)
Pt was called and informed of letter being ready for pick up and script being ready for pick up.

## 2017-07-09 NOTE — Telephone Encounter (Signed)
Pt states that he needs the letter to state that his wife cared for him everytime that he was released from the hospital.

## 2017-07-09 NOTE — Telephone Encounter (Signed)
Please call patient to get the information about the request See previous letters for reference

## 2017-07-14 NOTE — Telephone Encounter (Signed)
Pt was called and a VM was left informing pt that letter is ready for pick up. 

## 2017-07-14 NOTE — Telephone Encounter (Signed)
Pt. Wife called stating that they need the letter ASAP. Please f/u with pt.

## 2017-07-14 NOTE — Telephone Encounter (Signed)
Will route to PCP 

## 2017-07-14 NOTE — Telephone Encounter (Signed)
Updated letter written and ready for pick up Please inform patient

## 2017-07-21 ENCOUNTER — Ambulatory Visit: Payer: Medicaid Other | Admitting: Nurse Practitioner

## 2017-07-24 ENCOUNTER — Telehealth: Payer: Self-pay | Admitting: Family Medicine

## 2017-07-24 DIAGNOSIS — K0889 Other specified disorders of teeth and supporting structures: Secondary | ICD-10-CM

## 2017-07-24 NOTE — Telephone Encounter (Signed)
Dental referral placed.

## 2017-07-24 NOTE — Telephone Encounter (Signed)
Patient called requesting a dental referral, pt has medicaid . Please f/up

## 2017-07-29 ENCOUNTER — Ambulatory Visit: Payer: Medicaid Other | Admitting: Family Medicine

## 2017-08-05 ENCOUNTER — Ambulatory Visit (INDEPENDENT_AMBULATORY_CARE_PROVIDER_SITE_OTHER): Payer: Medicaid Other | Admitting: Family Medicine

## 2017-08-05 ENCOUNTER — Encounter: Payer: Self-pay | Admitting: Family Medicine

## 2017-08-05 VITALS — BP 130/80 | Ht 74.0 in | Wt 215.0 lb

## 2017-08-05 DIAGNOSIS — M25532 Pain in left wrist: Secondary | ICD-10-CM | POA: Diagnosis not present

## 2017-08-05 DIAGNOSIS — G8929 Other chronic pain: Secondary | ICD-10-CM

## 2017-08-05 DIAGNOSIS — M25561 Pain in right knee: Secondary | ICD-10-CM | POA: Diagnosis not present

## 2017-08-05 DIAGNOSIS — M25531 Pain in right wrist: Secondary | ICD-10-CM

## 2017-08-05 NOTE — Patient Instructions (Signed)
Appt. On Monday 08/10/17 at 1:30 pm with Dr. Grandville Silos at [facility]  [phone]
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
## 2017-09-07 ENCOUNTER — Encounter: Payer: Self-pay | Admitting: Pulmonary Disease

## 2017-09-22 ENCOUNTER — Ambulatory Visit: Payer: Medicaid Other | Admitting: Pulmonary Disease

## 2017-10-07 ENCOUNTER — Emergency Department (HOSPITAL_COMMUNITY): Payer: Medicaid Other

## 2017-10-07 ENCOUNTER — Ambulatory Visit (INDEPENDENT_AMBULATORY_CARE_PROVIDER_SITE_OTHER): Payer: Medicaid Other | Admitting: *Deleted

## 2017-10-07 ENCOUNTER — Emergency Department (HOSPITAL_COMMUNITY)
Admission: EM | Admit: 2017-10-07 | Discharge: 2017-10-07 | Disposition: A | Discharge status: Home / Self Care | Payer: Medicaid Other | Attending: Emergency Medicine | Admitting: Emergency Medicine

## 2017-10-07 ENCOUNTER — Encounter (HOSPITAL_COMMUNITY): Payer: Self-pay

## 2017-10-07 DIAGNOSIS — Z79899 Other long term (current) drug therapy: Secondary | ICD-10-CM | POA: Diagnosis not present

## 2017-10-07 DIAGNOSIS — I495 Sick sinus syndrome: Secondary | ICD-10-CM | POA: Diagnosis not present

## 2017-10-07 DIAGNOSIS — J449 Chronic obstructive pulmonary disease, unspecified: Secondary | ICD-10-CM | POA: Diagnosis not present

## 2017-10-07 DIAGNOSIS — Z7982 Long term (current) use of aspirin: Secondary | ICD-10-CM | POA: Diagnosis not present

## 2017-10-07 DIAGNOSIS — I10 Essential (primary) hypertension: Secondary | ICD-10-CM | POA: Insufficient documentation

## 2017-10-07 DIAGNOSIS — Z95811 Presence of heart assist device: Secondary | ICD-10-CM | POA: Diagnosis not present

## 2017-10-07 DIAGNOSIS — R55 Syncope and collapse: Secondary | ICD-10-CM

## 2017-10-07 DIAGNOSIS — I252 Old myocardial infarction: Secondary | ICD-10-CM | POA: Insufficient documentation

## 2017-10-07 DIAGNOSIS — Z87891 Personal history of nicotine dependence: Secondary | ICD-10-CM | POA: Insufficient documentation

## 2017-10-07 LAB — BASIC METABOLIC PANEL
Anion gap: 11 (ref 5–15)
BUN: 21 mg/dL — AB (ref 6–20)
CO2: 23 mmol/L (ref 22–32)
CREATININE: 1.03 mg/dL (ref 0.61–1.24)
Calcium: 9.4 mg/dL (ref 8.9–10.3)
Chloride: 101 mmol/L (ref 101–111)
GFR calc Af Amer: >60 mL/min (ref >60)
Glucose, Bld: 228 mg/dL — ABNORMAL HIGH (ref 65–99)
Potassium: 4.4 mmol/L (ref 3.5–5.1)
SODIUM: 135 mmol/L (ref 135–145)

## 2017-10-07 LAB — CBC
HCT: 44.5 % (ref 39.0–52.0)
Hemoglobin: 14.6 g/dL (ref 13.0–17.0)
MCH: 27.6 pg (ref 26.0–34.0)
MCHC: 32.8 g/dL (ref 30.0–36.0)
MCV: 84.1 fL (ref 78.0–100.0)
PLATELETS: 180 10*3/uL (ref 150–400)
RBC: 5.29 MIL/uL (ref 4.22–5.81)
RDW: 14.4 % (ref 11.5–15.5)
WBC: 6.9 10*3/uL (ref 4.0–10.5)

## 2017-10-07 LAB — CBG MONITORING, ED: Glucose-Capillary: 214 mg/dL — ABNORMAL HIGH (ref 65–99)

## 2017-10-07 LAB — I-STAT TROPONIN, ED: TROPONIN I, POC: 0 ng/mL (ref 0.00–0.08)

## 2017-10-07 MED ORDER — LEVETIRACETAM 500 MG PO TABS: 500.0000 mg | ORAL_TABLET | Freq: Two times a day (BID) | ORAL | 0 refills | Status: DC

## 2017-10-07 NOTE — ED Notes (Signed)
Patient's Geologist, engineering. Awaiting [facility] [employer] faxed report.

## 2017-10-07 NOTE — ED Notes (Signed)
No report has been received from [facility] regarding Patient's interrogation. [employer] representative paged. EDPA aware.

## 2017-10-07 NOTE — ED Notes (Signed)
ED PA at bedside

## 2017-10-07 NOTE — ED Notes (Signed)
Patient with history of hypertension and atrial pacemaker, was seen at PCP today for dizziness and experienced a syncopal episode with LOC. FSBG for EMS was 209. Patient's wife states she feels like there may be some "seizure like activity" when the patient passes out, but states the patient does not have a history of seizures. Patient is alert in triage. Denies pain, but endorses dizziness. Patient on cardiac monitor.

## 2017-10-07 NOTE — ED Triage Notes (Signed)
He comes to [place] via [place] having had a syncopal episode while at [employer] [facility] [employer] today. He was seen by Dr. Trellis Moment 336 [phone] ext. [phone] if needed. He has atrial pacer and arrives at our facility in no distress.

## 2017-10-07 NOTE — ED Notes (Signed)
Bed: WA03 Expected date:  Expected time:  Means of arrival:  Comments: EMS/syncope

## 2017-10-07 NOTE — ED Notes (Signed)
No report has been received from [facility] regarding patient's pacemaker interrogation. This nurse attempted to interrogate patient's pacemaker again. Will await report.
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
## 2017-10-07 NOTE — Discharge Instructions (Signed)
Your pacemaker was interrogated today and did not show any arrhythmias that occurred today.  As per recommendation from previous neurology notes we will start you on Keppra twice a day.  Please follow-up outpatient with both neurology and cardiology.  You may need tilt table testing here for further evaluation of what is causing near syncope.  Avoid driving cars, operating heavy machinery, heights and swimming until this is evaluated.  Return to the ED if you experience worsening of your symptoms, chest pain, difficulty breathing.

## 2017-10-07 NOTE — ED Notes (Signed)
Patient transported to CT 

## 2017-10-07 NOTE — ED Notes (Signed)
This Probation officer received a call from [facility] pacemaker representative stating patient's interrogation report went through. Awaiting faxed report now.

## 2017-10-08 NOTE — Progress Notes (Signed)
Remote pacemaker transmission.   

## 2017-10-09 ENCOUNTER — Encounter: Payer: Self-pay | Admitting: Cardiology

## 2017-10-09 LAB — CUP PACEART REMOTE DEVICE CHECK
Battery Remaining Longevity: 132 mo
Battery Remaining Percentage: 95.5 %
Battery Voltage: 3.02 V
Brady Statistic AP VP Percent: 1 %
Brady Statistic AP VS Percent: 82 %
Brady Statistic AS VP Percent: 1 %
Brady Statistic AS VS Percent: 17 %
Implantable Lead Implant Date: 20151228
Implantable Pulse Generator Implant Date: 20151228
Lead Channel Pacing Threshold Amplitude: 0.375 V
Lead Channel Pacing Threshold Pulse Width: 0.4 ms
Lead Channel Sensing Intrinsic Amplitude: 4.2 mV
Lead Channel Setting Pacing Amplitude: 1 V
MDC IDC LEAD IMPLANT DT: 20151228
MDC IDC LEAD LOCATION: 753859
MDC IDC LEAD LOCATION: 753860
MDC IDC MSMT LEADCHNL RA IMPEDANCE VALUE: 490 Ohm
MDC IDC MSMT LEADCHNL RA PACING THRESHOLD PULSEWIDTH: 0.4 ms
MDC IDC MSMT LEADCHNL RV IMPEDANCE VALUE: 480 Ohm
MDC IDC MSMT LEADCHNL RV PACING THRESHOLD AMPLITUDE: 0.75 V
MDC IDC MSMT LEADCHNL RV SENSING INTR AMPL: 5.6 mV
MDC IDC PG SERIAL: 7699241
MDC IDC SESS DTM: 20181024183457
MDC IDC SET LEADCHNL RA PACING AMPLITUDE: 1.375
MDC IDC SET LEADCHNL RV PACING PULSEWIDTH: 0.4 ms
MDC IDC SET LEADCHNL RV SENSING SENSITIVITY: 0.5 mV
MDC IDC STAT BRADY RA PERCENT PACED: 82 %
MDC IDC STAT BRADY RV PERCENT PACED: 1 %

## 2018-02-03 ENCOUNTER — Emergency Department (HOSPITAL_COMMUNITY): Payer: Medicaid Other

## 2018-02-03 ENCOUNTER — Emergency Department (HOSPITAL_COMMUNITY)
Admission: EM | Admit: 2018-02-03 | Discharge: 2018-02-03 | Disposition: A | Discharge status: Home / Self Care | Payer: Medicaid Other | Attending: Emergency Medicine | Admitting: Emergency Medicine

## 2018-02-03 ENCOUNTER — Encounter (HOSPITAL_COMMUNITY): Payer: Self-pay | Admitting: Family Medicine

## 2018-02-03 DIAGNOSIS — R05 Cough: Secondary | ICD-10-CM | POA: Diagnosis not present

## 2018-02-03 DIAGNOSIS — R062 Wheezing: Secondary | ICD-10-CM | POA: Diagnosis not present

## 2018-02-03 DIAGNOSIS — R51 Headache: Secondary | ICD-10-CM | POA: Insufficient documentation

## 2018-02-03 DIAGNOSIS — I10 Essential (primary) hypertension: Secondary | ICD-10-CM | POA: Insufficient documentation

## 2018-02-03 DIAGNOSIS — E119 Type 2 diabetes mellitus without complications: Secondary | ICD-10-CM | POA: Insufficient documentation

## 2018-02-03 DIAGNOSIS — Z95811 Presence of heart assist device: Secondary | ICD-10-CM | POA: Diagnosis not present

## 2018-02-03 DIAGNOSIS — R0789 Other chest pain: Secondary | ICD-10-CM | POA: Diagnosis not present

## 2018-02-03 DIAGNOSIS — J101 Influenza due to other identified influenza virus with other respiratory manifestations: Secondary | ICD-10-CM | POA: Diagnosis not present

## 2018-02-03 DIAGNOSIS — R4182 Altered mental status, unspecified: Secondary | ICD-10-CM | POA: Diagnosis present

## 2018-02-03 DIAGNOSIS — Z87891 Personal history of nicotine dependence: Secondary | ICD-10-CM | POA: Insufficient documentation

## 2018-02-03 DIAGNOSIS — J441 Chronic obstructive pulmonary disease with (acute) exacerbation: Secondary | ICD-10-CM

## 2018-02-03 DIAGNOSIS — R509 Fever, unspecified: Secondary | ICD-10-CM | POA: Diagnosis not present

## 2018-02-03 DIAGNOSIS — Z79899 Other long term (current) drug therapy: Secondary | ICD-10-CM | POA: Insufficient documentation

## 2018-02-03 LAB — INFLUENZA PANEL BY PCR (TYPE A & B)
INFLBPCR: NEGATIVE
Influenza A By PCR: POSITIVE — AB

## 2018-02-03 LAB — URINALYSIS, ROUTINE W REFLEX MICROSCOPIC
Bacteria, UA: NONE SEEN
Bilirubin Urine: NEGATIVE
Glucose, UA: >=500 mg/dL — AB
Hgb urine dipstick: NEGATIVE
Ketones, ur: NEGATIVE mg/dL
Leukocytes, UA: NEGATIVE
Nitrite: NEGATIVE
PH: 5 (ref 5.0–8.0)
Protein, ur: NEGATIVE mg/dL
SPECIFIC GRAVITY, URINE: 1.022 (ref 1.005–1.030)

## 2018-02-03 LAB — RAPID URINE DRUG SCREEN, HOSP PERFORMED
Amphetamines: NOT DETECTED
BARBITURATES: NOT DETECTED
BENZODIAZEPINES: NOT DETECTED
Cocaine: NOT DETECTED
Opiates: POSITIVE — AB
TETRAHYDROCANNABINOL: NOT DETECTED

## 2018-02-03 LAB — I-STAT TROPONIN, ED: TROPONIN I, POC: 0 ng/mL (ref 0.00–0.08)

## 2018-02-03 LAB — BASIC METABOLIC PANEL
ANION GAP: 12 (ref 5–15)
BUN: 15 mg/dL (ref 6–20)
CALCIUM: 9.4 mg/dL (ref 8.9–10.3)
CO2: 23 mmol/L (ref 22–32)
Chloride: 102 mmol/L (ref 101–111)
Creatinine, Ser: 0.95 mg/dL (ref 0.61–1.24)
GFR calc non Af Amer: >60 mL/min (ref >60)
Glucose, Bld: 184 mg/dL — ABNORMAL HIGH (ref 65–99)
Potassium: 4.1 mmol/L (ref 3.5–5.1)
SODIUM: 137 mmol/L (ref 135–145)

## 2018-02-03 LAB — ACETAMINOPHEN LEVEL: Acetaminophen (Tylenol), Serum: <10 ug/mL — ABNORMAL LOW (ref 10–30)

## 2018-02-03 LAB — CBC
HCT: 49.7 % (ref 39.0–52.0)
HEMOGLOBIN: 16.7 g/dL (ref 13.0–17.0)
MCH: 28.2 pg (ref 26.0–34.0)
MCHC: 33.6 g/dL (ref 30.0–36.0)
MCV: 83.8 fL (ref 78.0–100.0)
PLATELETS: 189 10*3/uL (ref 150–400)
RBC: 5.93 MIL/uL — AB (ref 4.22–5.81)
RDW: 14 % (ref 11.5–15.5)
WBC: 6.7 10*3/uL (ref 4.0–10.5)

## 2018-02-03 LAB — AMMONIA: AMMONIA: 23 umol/L (ref 9–35)

## 2018-02-03 LAB — I-STAT CG4 LACTIC ACID, ED: LACTIC ACID, VENOUS: 1.03 mmol/L (ref 0.5–1.9)

## 2018-02-03 LAB — BRAIN NATRIURETIC PEPTIDE: B Natriuretic Peptide: 5.2 pg/mL (ref 0.0–100.0)

## 2018-02-03 LAB — PROTIME-INR
INR: 1
PROTHROMBIN TIME: 13.1 s (ref 11.4–15.2)

## 2018-02-03 LAB — SALICYLATE LEVEL

## 2018-02-03 MED ORDER — SODIUM CHLORIDE 0.9 % IV BOLUS (SEPSIS)
1000.0000 mL | Freq: Once | INTRAVENOUS | Status: AC
  Administered 2018-02-03: 1000 mL via INTRAVENOUS

## 2018-02-03 MED ORDER — DOXYCYCLINE HYCLATE 100 MG PO CAPS: 100.0000 mg | ORAL_CAPSULE | Freq: Two times a day (BID) | ORAL | 0 refills | Status: DC

## 2018-02-03 MED ORDER — ACETAMINOPHEN 500 MG PO TABS
1000.0000 mg | ORAL_TABLET | Freq: Once | ORAL | Status: AC
  Administered 2018-02-03: 1000 mg via ORAL
  Filled 2018-02-03: qty 2

## 2018-02-03 MED ORDER — OSELTAMIVIR PHOSPHATE 75 MG PO CAPS: 75.0000 mg | ORAL_CAPSULE | Freq: Two times a day (BID) | ORAL | 0 refills | Status: DC

## 2018-02-03 NOTE — Discharge Instructions (Signed)
You have type A influenza. Take tamiflu to prevent complications. Given history of COPD, we recommend doxycyline to help with cough and phlegm.   I recommend you call your primary care doctor in 2-3 days for re-evaluation and to ensure there is improvement in symptoms.   The main treatment approach for a viral upper respiratory infection and/or influenza is to treat the symptoms, support your immune system and prevent spread of illness. Stay well-hydrated. Rest. Take ibuprofen or Tylenol around the clock to help with associated fevers, sore throat, headaches, generalized body aches and malaise. Use an over-the-counter nasal steroid spray (like Flonase) to help with nasal congestion, runny nose and postnasal drip.  Wash your hands often to prevent spread.  A viral upper respiratory infection and/or influenza typically lasts 5-7 days.  Symptoms resolve slowly.  However, a viral upper respiratory infection can also worsen and progress into pneumonia.  Monitor your symptoms. If your symptoms worsen, persist and you develop persistent fevers, chest pain, productive cough you should follow up with your primary care provider.

## 2018-02-03 NOTE — ED Triage Notes (Signed)
Patient is from home. Patients spouse reports he became altered mental status yesterday. She reports patient is usually joking but he has not been his usual self. Patient is alert, oriented x 4. Patient complains of left sided chest pain, body aches, sore throat, headache, and shortness of breath. Symptoms started yesterday. Denies fever, nausea, vomiting, and diarrhea.
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
## 2018-02-05 LAB — URINE CULTURE

## 2018-02-08 LAB — CULTURE, BLOOD (ROUTINE X 2)
CULTURE: NO GROWTH
CULTURE: NO GROWTH
SPECIAL REQUESTS: ADEQUATE
Special Requests: ADEQUATE

## 2018-02-19 ENCOUNTER — Other Ambulatory Visit: Payer: Self-pay

## 2018-02-19 ENCOUNTER — Inpatient Hospital Stay (HOSPITAL_COMMUNITY)
Admission: EM | Admit: 2018-02-19 | Discharge: 2018-02-23 | DRG: 871 | Disposition: A | Discharge status: Home / Self Care | Payer: Medicaid Other | Attending: Internal Medicine | Admitting: Internal Medicine

## 2018-02-19 ENCOUNTER — Emergency Department (HOSPITAL_COMMUNITY): Payer: Medicaid Other

## 2018-02-19 ENCOUNTER — Encounter (HOSPITAL_COMMUNITY): Payer: Self-pay | Admitting: Emergency Medicine

## 2018-02-19 DIAGNOSIS — G92 Toxic encephalopathy: Secondary | ICD-10-CM

## 2018-02-19 DIAGNOSIS — G9340 Encephalopathy, unspecified: Secondary | ICD-10-CM

## 2018-02-19 DIAGNOSIS — R159 Full incontinence of feces: Secondary | ICD-10-CM

## 2018-02-19 DIAGNOSIS — I700 Atherosclerosis of aorta: Secondary | ICD-10-CM | POA: Diagnosis present

## 2018-02-19 DIAGNOSIS — E785 Hyperlipidemia, unspecified: Secondary | ICD-10-CM | POA: Diagnosis present

## 2018-02-19 DIAGNOSIS — N390 Urinary tract infection, site not specified: Secondary | ICD-10-CM | POA: Diagnosis present

## 2018-02-19 DIAGNOSIS — F17210 Nicotine dependence, cigarettes, uncomplicated: Secondary | ICD-10-CM | POA: Diagnosis present

## 2018-02-19 DIAGNOSIS — E669 Obesity, unspecified: Secondary | ICD-10-CM

## 2018-02-19 DIAGNOSIS — E1169 Type 2 diabetes mellitus with other specified complication: Secondary | ICD-10-CM

## 2018-02-19 DIAGNOSIS — R569 Unspecified convulsions: Secondary | ICD-10-CM

## 2018-02-19 DIAGNOSIS — I959 Hypotension, unspecified: Secondary | ICD-10-CM | POA: Diagnosis present

## 2018-02-19 DIAGNOSIS — R152 Fecal urgency: Secondary | ICD-10-CM

## 2018-02-19 DIAGNOSIS — Z8673 Personal history of transient ischemic attack (TIA), and cerebral infarction without residual deficits: Secondary | ICD-10-CM

## 2018-02-19 DIAGNOSIS — Z7982 Long term (current) use of aspirin: Secondary | ICD-10-CM

## 2018-02-19 DIAGNOSIS — I252 Old myocardial infarction: Secondary | ICD-10-CM

## 2018-02-19 DIAGNOSIS — R4182 Altered mental status, unspecified: Secondary | ICD-10-CM

## 2018-02-19 DIAGNOSIS — E1142 Type 2 diabetes mellitus with diabetic polyneuropathy: Secondary | ICD-10-CM | POA: Diagnosis present

## 2018-02-19 DIAGNOSIS — Z880 Allergy status to penicillin: Secondary | ICD-10-CM

## 2018-02-19 DIAGNOSIS — T368X5A Adverse effect of other systemic antibiotics, initial encounter: Secondary | ICD-10-CM | POA: Diagnosis not present

## 2018-02-19 DIAGNOSIS — G9341 Metabolic encephalopathy: Secondary | ICD-10-CM | POA: Diagnosis present

## 2018-02-19 DIAGNOSIS — I10 Essential (primary) hypertension: Secondary | ICD-10-CM | POA: Diagnosis present

## 2018-02-19 DIAGNOSIS — E119 Type 2 diabetes mellitus without complications: Secondary | ICD-10-CM

## 2018-02-19 DIAGNOSIS — G4733 Obstructive sleep apnea (adult) (pediatric): Secondary | ICD-10-CM | POA: Diagnosis present

## 2018-02-19 DIAGNOSIS — E1165 Type 2 diabetes mellitus with hyperglycemia: Secondary | ICD-10-CM | POA: Diagnosis present

## 2018-02-19 DIAGNOSIS — R21 Rash and other nonspecific skin eruption: Secondary | ICD-10-CM | POA: Diagnosis present

## 2018-02-19 DIAGNOSIS — D696 Thrombocytopenia, unspecified: Secondary | ICD-10-CM | POA: Diagnosis present

## 2018-02-19 DIAGNOSIS — Z79899 Other long term (current) drug therapy: Secondary | ICD-10-CM

## 2018-02-19 DIAGNOSIS — N400 Enlarged prostate without lower urinary tract symptoms: Secondary | ICD-10-CM | POA: Diagnosis present

## 2018-02-19 DIAGNOSIS — B961 Klebsiella pneumoniae [K. pneumoniae] as the cause of diseases classified elsewhere: Secondary | ICD-10-CM | POA: Diagnosis present

## 2018-02-19 DIAGNOSIS — I714 Abdominal aortic aneurysm, without rupture: Secondary | ICD-10-CM | POA: Diagnosis present

## 2018-02-19 DIAGNOSIS — J449 Chronic obstructive pulmonary disease, unspecified: Secondary | ICD-10-CM | POA: Diagnosis present

## 2018-02-19 DIAGNOSIS — Z791 Long term (current) use of non-steroidal anti-inflammatories (NSAID): Secondary | ICD-10-CM

## 2018-02-19 DIAGNOSIS — F1911 Other psychoactive substance abuse, in remission: Secondary | ICD-10-CM

## 2018-02-19 DIAGNOSIS — A419 Sepsis, unspecified organism: Secondary | ICD-10-CM | POA: Diagnosis present

## 2018-02-19 DIAGNOSIS — F5104 Psychophysiologic insomnia: Secondary | ICD-10-CM | POA: Diagnosis present

## 2018-02-19 DIAGNOSIS — R109 Unspecified abdominal pain: Secondary | ICD-10-CM

## 2018-02-19 DIAGNOSIS — G250 Essential tremor: Secondary | ICD-10-CM | POA: Diagnosis present

## 2018-02-19 DIAGNOSIS — G928 Other toxic encephalopathy: Secondary | ICD-10-CM

## 2018-02-19 DIAGNOSIS — I495 Sick sinus syndrome: Secondary | ICD-10-CM | POA: Diagnosis present

## 2018-02-19 DIAGNOSIS — Z950 Presence of cardiac pacemaker: Secondary | ICD-10-CM

## 2018-02-19 LAB — URINALYSIS, ROUTINE W REFLEX MICROSCOPIC
Bilirubin Urine: NEGATIVE
Glucose, UA: 50 mg/dL — AB
Ketones, ur: NEGATIVE mg/dL
Nitrite: NEGATIVE
PROTEIN: NEGATIVE mg/dL
SPECIFIC GRAVITY, URINE: 1.018 (ref 1.005–1.030)
pH: 5 (ref 5.0–8.0)

## 2018-02-19 LAB — CBC WITH DIFFERENTIAL/PLATELET
BASOS ABS: 0 10*3/uL (ref 0.0–0.1)
BASOS PCT: 0 %
EOS PCT: 0 %
Eosinophils Absolute: 0 10*3/uL (ref 0.0–0.7)
HEMATOCRIT: 44.5 % (ref 39.0–52.0)
Hemoglobin: 14.8 g/dL (ref 13.0–17.0)
LYMPHS PCT: 15 %
Lymphs Abs: 1.6 10*3/uL (ref 0.7–4.0)
MCH: 27.6 pg (ref 26.0–34.0)
MCHC: 33.3 g/dL (ref 30.0–36.0)
MCV: 82.9 fL (ref 78.0–100.0)
Monocytes Absolute: 0.4 10*3/uL (ref 0.1–1.0)
Monocytes Relative: 4 %
NEUTROS ABS: 8.4 10*3/uL — AB (ref 1.7–7.7)
Neutrophils Relative %: 81 %
PLATELETS: 136 10*3/uL — AB (ref 150–400)
RBC: 5.37 MIL/uL (ref 4.22–5.81)
RDW: 14 % (ref 11.5–15.5)
WBC: 10.4 10*3/uL (ref 4.0–10.5)

## 2018-02-19 LAB — I-STAT TROPONIN, ED: TROPONIN I, POC: 0.01 ng/mL (ref 0.00–0.08)

## 2018-02-19 LAB — BASIC METABOLIC PANEL
Anion gap: 10 (ref 5–15)
BUN: 13 mg/dL (ref 6–20)
CALCIUM: 9.3 mg/dL (ref 8.9–10.3)
CO2: 25 mmol/L (ref 22–32)
CREATININE: 0.85 mg/dL (ref 0.61–1.24)
Chloride: 101 mmol/L (ref 101–111)
GFR calc Af Amer: >60 mL/min (ref >60)
Glucose, Bld: 137 mg/dL — ABNORMAL HIGH (ref 65–99)
Potassium: 3.8 mmol/L (ref 3.5–5.1)
SODIUM: 136 mmol/L (ref 135–145)

## 2018-02-19 LAB — AMMONIA: AMMONIA: 38 umol/L — AB (ref 9–35)

## 2018-02-19 LAB — I-STAT CG4 LACTIC ACID, ED: LACTIC ACID, VENOUS: 1.97 mmol/L — AB (ref 0.5–1.9)

## 2018-02-19 LAB — CBG MONITORING, ED: Glucose-Capillary: 139 mg/dL — ABNORMAL HIGH (ref 65–99)

## 2018-02-19 MED ORDER — ACETAMINOPHEN 500 MG PO TABS
1000.0000 mg | ORAL_TABLET | Freq: Once | ORAL | Status: AC
  Administered 2018-02-19: 1000 mg via ORAL
  Filled 2018-02-19: qty 2

## 2018-02-19 MED ORDER — VANCOMYCIN HCL IN DEXTROSE 1-5 GM/200ML-% IV SOLN: 1000.0000 mg | Freq: Once | INTRAVENOUS | Status: DC

## 2018-02-19 MED ORDER — SODIUM CHLORIDE 0.9 % IV SOLN
2.0000 g | Freq: Once | INTRAVENOUS | Status: DC
  Filled 2018-02-19: qty 2

## 2018-02-19 MED ORDER — DIPHENHYDRAMINE HCL 50 MG/ML IJ SOLN
50.0000 mg | Freq: Once | INTRAMUSCULAR | Status: AC
  Administered 2018-02-19: 50 mg via INTRAVENOUS
  Filled 2018-02-19: qty 1

## 2018-02-19 MED ORDER — VANCOMYCIN HCL 10 G IV SOLR
2000.0000 mg | Freq: Once | INTRAVENOUS | Status: AC
  Administered 2018-02-19: 2000 mg via INTRAVENOUS
  Filled 2018-02-19: qty 2000

## 2018-02-19 MED ORDER — LEVOFLOXACIN IN D5W 750 MG/150ML IV SOLN
750.0000 mg | Freq: Once | INTRAVENOUS | Status: AC
  Administered 2018-02-19: 750 mg via INTRAVENOUS
  Filled 2018-02-19: qty 150

## 2018-02-19 MED ORDER — AZTREONAM IN DEXTROSE 2 GM/50ML IV SOLN
2.0000 g | Freq: Once | INTRAVENOUS | Status: AC
  Administered 2018-02-19: 2 g via INTRAVENOUS
  Filled 2018-02-19: qty 50

## 2018-02-19 MED ORDER — SODIUM CHLORIDE 0.9 % IV BOLUS (SEPSIS)
3000.0000 mL | Freq: Once | INTRAVENOUS | Status: AC
  Administered 2018-02-19: 3000 mL via INTRAVENOUS
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
## 2018-02-19 NOTE — ED Notes (Signed)
Pt is visibly uncomfortable, states he feels itching, visual inspection of his skin reveals a rash on his chest-torso area and upper extremities all consistent with hives. Although pt and family deny any known hx of being allergic to levaquin or any other flouroquinolone, given the new symptoms, pt maybe having a reaction to it. I have notified the attending EDP, who has ordered an antihistamine and promptly comes to assess the pt.

## 2018-02-19 NOTE — Progress Notes (Signed)
A consult was received from an ED physician for vancomycin, aztreonam and levaquin per pharmacy dosing.  The patient's profile has been reviewed for ht/wt/allergies/indication/available labs.   Pt has PCN allergy but has tolerated Rocephin in the past - I d/w Dr Alvino Chapel to rec change to Rocephin.   A one time order has been placed for vancomycin 2 gm, aztreonam 2 gm and levaquin 750 mg IV x 1 dose.    Further antibiotics/pharmacy consults should be ordered by admitting physician if indicated.                       Thank you,  Eudelia Bunch, Pharm.D. [phone] 02/19/2018 9:45 PM

## 2018-02-19 NOTE — ED Notes (Signed)
Pt reports that around 10a he started urinating on himself. He is now complaining of urinary frequency, pelvic pain, abdominal pain and a headache. Pt is writhing and will not stay still.

## 2018-02-19 NOTE — ED Notes (Signed)
Reassessment Note: Pt denies being itchy anymore, he is seemingly more comfortable/relaxed, the rash is no longer spreading, no airway or oral mucosal involved noted.

## 2018-02-19 NOTE — ED Notes (Signed)
Pt transporting to xray.  

## 2018-02-19 NOTE — ED Notes (Signed)
Urine specimen at bedside

## 2018-02-20 ENCOUNTER — Encounter (HOSPITAL_COMMUNITY): Payer: Self-pay | Admitting: Internal Medicine

## 2018-02-20 ENCOUNTER — Emergency Department (HOSPITAL_COMMUNITY): Payer: Medicaid Other

## 2018-02-20 ENCOUNTER — Inpatient Hospital Stay (HOSPITAL_COMMUNITY): Payer: Medicaid Other

## 2018-02-20 DIAGNOSIS — E1142 Type 2 diabetes mellitus with diabetic polyneuropathy: Secondary | ICD-10-CM | POA: Diagnosis present

## 2018-02-20 DIAGNOSIS — N390 Urinary tract infection, site not specified: Secondary | ICD-10-CM | POA: Diagnosis present

## 2018-02-20 DIAGNOSIS — G4733 Obstructive sleep apnea (adult) (pediatric): Secondary | ICD-10-CM | POA: Diagnosis present

## 2018-02-20 DIAGNOSIS — E785 Hyperlipidemia, unspecified: Secondary | ICD-10-CM | POA: Diagnosis present

## 2018-02-20 DIAGNOSIS — F17210 Nicotine dependence, cigarettes, uncomplicated: Secondary | ICD-10-CM | POA: Diagnosis present

## 2018-02-20 DIAGNOSIS — B961 Klebsiella pneumoniae [K. pneumoniae] as the cause of diseases classified elsewhere: Secondary | ICD-10-CM | POA: Diagnosis present

## 2018-02-20 DIAGNOSIS — A419 Sepsis, unspecified organism: Secondary | ICD-10-CM | POA: Diagnosis present

## 2018-02-20 DIAGNOSIS — G250 Essential tremor: Secondary | ICD-10-CM | POA: Diagnosis present

## 2018-02-20 DIAGNOSIS — R21 Rash and other nonspecific skin eruption: Secondary | ICD-10-CM | POA: Diagnosis present

## 2018-02-20 DIAGNOSIS — F5104 Psychophysiologic insomnia: Secondary | ICD-10-CM | POA: Diagnosis present

## 2018-02-20 DIAGNOSIS — I252 Old myocardial infarction: Secondary | ICD-10-CM | POA: Diagnosis not present

## 2018-02-20 DIAGNOSIS — D696 Thrombocytopenia, unspecified: Secondary | ICD-10-CM | POA: Diagnosis present

## 2018-02-20 DIAGNOSIS — I959 Hypotension, unspecified: Secondary | ICD-10-CM | POA: Diagnosis present

## 2018-02-20 DIAGNOSIS — Z8673 Personal history of transient ischemic attack (TIA), and cerebral infarction without residual deficits: Secondary | ICD-10-CM | POA: Diagnosis not present

## 2018-02-20 DIAGNOSIS — G9340 Encephalopathy, unspecified: Secondary | ICD-10-CM

## 2018-02-20 DIAGNOSIS — R569 Unspecified convulsions: Secondary | ICD-10-CM | POA: Diagnosis present

## 2018-02-20 DIAGNOSIS — N400 Enlarged prostate without lower urinary tract symptoms: Secondary | ICD-10-CM | POA: Diagnosis present

## 2018-02-20 DIAGNOSIS — J449 Chronic obstructive pulmonary disease, unspecified: Secondary | ICD-10-CM | POA: Diagnosis present

## 2018-02-20 DIAGNOSIS — I700 Atherosclerosis of aorta: Secondary | ICD-10-CM | POA: Diagnosis present

## 2018-02-20 DIAGNOSIS — E1165 Type 2 diabetes mellitus with hyperglycemia: Secondary | ICD-10-CM | POA: Diagnosis present

## 2018-02-20 DIAGNOSIS — I10 Essential (primary) hypertension: Secondary | ICD-10-CM | POA: Diagnosis present

## 2018-02-20 DIAGNOSIS — I495 Sick sinus syndrome: Secondary | ICD-10-CM | POA: Diagnosis present

## 2018-02-20 DIAGNOSIS — T368X5A Adverse effect of other systemic antibiotics, initial encounter: Secondary | ICD-10-CM | POA: Diagnosis not present

## 2018-02-20 DIAGNOSIS — I714 Abdominal aortic aneurysm, without rupture: Secondary | ICD-10-CM | POA: Diagnosis present

## 2018-02-20 DIAGNOSIS — G9341 Metabolic encephalopathy: Secondary | ICD-10-CM | POA: Diagnosis present

## 2018-02-20 LAB — GLUCOSE, CAPILLARY
GLUCOSE-CAPILLARY: 169 mg/dL — AB (ref 65–99)
GLUCOSE-CAPILLARY: 122 mg/dL — AB (ref 65–99)
Glucose-Capillary: 149 mg/dL — ABNORMAL HIGH (ref 65–99)
Glucose-Capillary: 102 mg/dL — ABNORMAL HIGH (ref 65–99)
Glucose-Capillary: 193 mg/dL — ABNORMAL HIGH (ref 65–99)

## 2018-02-20 LAB — COMPREHENSIVE METABOLIC PANEL
ALT: 29 U/L (ref 17–63)
ANION GAP: 7 (ref 5–15)
AST: 13 U/L — AB (ref 15–41)
Albumin: 3.1 g/dL — ABNORMAL LOW (ref 3.5–5.0)
Alkaline Phosphatase: 91 U/L (ref 38–126)
BUN: 11 mg/dL (ref 6–20)
CHLORIDE: 108 mmol/L (ref 101–111)
CO2: 24 mmol/L (ref 22–32)
Calcium: 8.1 mg/dL — ABNORMAL LOW (ref 8.9–10.3)
Creatinine, Ser: 0.79 mg/dL (ref 0.61–1.24)
Glucose, Bld: 161 mg/dL — ABNORMAL HIGH (ref 65–99)
POTASSIUM: 3.7 mmol/L (ref 3.5–5.1)
Sodium: 139 mmol/L (ref 135–145)
Total Bilirubin: 1.3 mg/dL — ABNORMAL HIGH (ref 0.3–1.2)
Total Protein: 5.9 g/dL — ABNORMAL LOW (ref 6.5–8.1)

## 2018-02-20 LAB — CBC WITH DIFFERENTIAL/PLATELET
BASOS ABS: 0 10*3/uL (ref 0.0–0.1)
Basophils Relative: 0 %
EOS PCT: 1 %
Eosinophils Absolute: 0.1 10*3/uL (ref 0.0–0.7)
HCT: 39.3 % (ref 39.0–52.0)
Hemoglobin: 12.7 g/dL — ABNORMAL LOW (ref 13.0–17.0)
LYMPHS PCT: 20 %
Lymphs Abs: 2.1 10*3/uL (ref 0.7–4.0)
MCH: 27.2 pg (ref 26.0–34.0)
MCHC: 32.3 g/dL (ref 30.0–36.0)
MCV: 84.2 fL (ref 78.0–100.0)
MONO ABS: 0.6 10*3/uL (ref 0.1–1.0)
Monocytes Relative: 6 %
Neutro Abs: 7.4 10*3/uL (ref 1.7–7.7)
Neutrophils Relative %: 73 %
PLATELETS: 125 10*3/uL — AB (ref 150–400)
RBC: 4.67 MIL/uL (ref 4.22–5.81)
RDW: 14.2 % (ref 11.5–15.5)
WBC: 10.2 10*3/uL (ref 4.0–10.5)

## 2018-02-20 LAB — TROPONIN I
Troponin I: <0.03 ng/mL (ref <0.03)
Troponin I: <0.03 ng/mL (ref <0.03)
Troponin I: <0.03 ng/mL (ref <0.03)

## 2018-02-20 LAB — I-STAT CG4 LACTIC ACID, ED: Lactic Acid, Venous: 1.16 mmol/L (ref 0.5–1.9)

## 2018-02-20 LAB — LACTIC ACID, PLASMA
LACTIC ACID, VENOUS: 1.1 mmol/L (ref 0.5–1.9)
LACTIC ACID, VENOUS: 1 mmol/L (ref 0.5–1.9)

## 2018-02-20 LAB — PROTIME-INR
INR: 1.19
PROTHROMBIN TIME: 15 s (ref 11.4–15.2)

## 2018-02-20 LAB — APTT: aPTT: 30 seconds (ref 24–36)

## 2018-02-20 LAB — PROCALCITONIN: Procalcitonin: 0.57 ng/mL

## 2018-02-20 MED ORDER — ONDANSETRON HCL 4 MG/2ML IJ SOLN: 4.0000 mg | Freq: Four times a day (QID) | INTRAMUSCULAR | Status: DC | PRN

## 2018-02-20 MED ORDER — LEVETIRACETAM IN NACL 1000 MG/100ML IV SOLN: 1000.0000 mg | Freq: Two times a day (BID) | INTRAVENOUS | Status: DC

## 2018-02-20 MED ORDER — IBUPROFEN 200 MG PO TABS
400.0000 mg | ORAL_TABLET | ORAL | Status: DC | PRN
  Administered 2018-02-20 – 2018-02-22 (×4): 400 mg via ORAL
  Filled 2018-02-20 (×4): qty 2

## 2018-02-20 MED ORDER — IOPAMIDOL (ISOVUE-300) INJECTION 61%
100.0000 mL | Freq: Once | INTRAVENOUS | Status: AC | PRN
  Administered 2018-02-20: 100 mL via INTRAVENOUS

## 2018-02-20 MED ORDER — SODIUM CHLORIDE 0.9 % IV SOLN
1.0000 g | Freq: Three times a day (TID) | INTRAVENOUS | Status: DC
  Administered 2018-02-20 – 2018-02-22 (×7): 1 g via INTRAVENOUS
  Filled 2018-02-20 (×9): qty 1

## 2018-02-20 MED ORDER — SODIUM CHLORIDE 0.9 % IV SOLN
INTRAVENOUS | Status: AC
  Administered 2018-02-20 (×2): via INTRAVENOUS

## 2018-02-20 MED ORDER — INSULIN ASPART 100 UNIT/ML ~~LOC~~ SOLN
0.0000 [IU] | SUBCUTANEOUS | Status: DC
  Administered 2018-02-20: 2 [IU] via SUBCUTANEOUS
  Administered 2018-02-20 (×2): 1 [IU] via SUBCUTANEOUS

## 2018-02-20 MED ORDER — SODIUM CHLORIDE 0.9 % IJ SOLN
INTRAMUSCULAR | Status: AC
  Filled 2018-02-20: qty 50

## 2018-02-20 MED ORDER — LEVETIRACETAM IN NACL 1000 MG/100ML IV SOLN
1000.0000 mg | Freq: Two times a day (BID) | INTRAVENOUS | Status: DC
  Administered 2018-02-20 – 2018-02-21 (×3): 1000 mg via INTRAVENOUS
  Filled 2018-02-20 (×4): qty 100

## 2018-02-20 MED ORDER — HYDRALAZINE HCL 20 MG/ML IJ SOLN: 5.0000 mg | INTRAMUSCULAR | Status: DC | PRN

## 2018-02-20 MED ORDER — ACETAMINOPHEN 325 MG PO TABS: 650.0000 mg | ORAL_TABLET | Freq: Four times a day (QID) | ORAL | Status: DC | PRN

## 2018-02-20 MED ORDER — IOPAMIDOL (ISOVUE-300) INJECTION 61%
INTRAVENOUS | Status: AC
  Filled 2018-02-20: qty 100

## 2018-02-20 MED ORDER — VANCOMYCIN HCL 10 G IV SOLR
1250.0000 mg | Freq: Two times a day (BID) | INTRAVENOUS | Status: DC
  Administered 2018-02-20 – 2018-02-21 (×3): 1250 mg via INTRAVENOUS
  Filled 2018-02-20 (×3): qty 1250

## 2018-02-20 MED ORDER — ONDANSETRON HCL 4 MG PO TABS: 4.0000 mg | ORAL_TABLET | Freq: Four times a day (QID) | ORAL | Status: DC | PRN

## 2018-02-20 MED ORDER — ACETAMINOPHEN 650 MG RE SUPP: 650.0000 mg | Freq: Four times a day (QID) | RECTAL | Status: DC | PRN

## 2018-02-20 MED ORDER — INSULIN ASPART 100 UNIT/ML ~~LOC~~ SOLN
0.0000 [IU] | Freq: Three times a day (TID) | SUBCUTANEOUS | Status: DC
  Administered 2018-02-21: 2 [IU] via SUBCUTANEOUS
  Administered 2018-02-21: 1 [IU] via SUBCUTANEOUS
  Administered 2018-02-21: 3 [IU] via SUBCUTANEOUS
  Administered 2018-02-22 (×2): 2 [IU] via SUBCUTANEOUS
  Administered 2018-02-22: 3 [IU] via SUBCUTANEOUS
  Administered 2018-02-23: 2 [IU] via SUBCUTANEOUS

## 2018-02-20 NOTE — ED Notes (Signed)
ED TO INPATIENT HANDOFF REPORT  Name/Age/Gender Wesley [name] 61 y.o. male  Code Status Code Status History    Date Active Date Inactive Code Status Order ID Comments User Context   05/20/2017 01:36 05/23/2017 16:36 Full Code [id number]  [name], MD ED   12/19/2016 13:48 12/22/2016 17:02 Full Code [id number]  [name], MD ED   09/16/2016 19:10 09/18/2016 19:08 Full Code [id number]  [name], MD Inpatient   08/28/2016 06:12 08/31/2016 20:59 Full Code [id number]  [name], MD Inpatient   04/05/2016 01:33 04/06/2016 16:43 Full Code [id number]  [name], MD Inpatient      Home/SNF/Other Home  Chief Complaint Chest pain, Abdominal pain, "Everything hurts"  Level of Care/Admitting Diagnosis ED Disposition    ED Disposition Condition [place]: [facility] [[id number]]  Level of Care: Telemetry [5]  Admit to tele based on following criteria: Monitor for Ischemic changes  Diagnosis: Sepsis [facility]) [[id number]]  Admitting Physician: [name] [phone]  Attending Physician: [name] [phone]  Estimated length of stay: past midnight tomorrow  Certification:: I certify this patient will need inpatient services for at least 2 midnights  PT Class (Do Not Modify): Inpatient [101]  PT Acc Code (Do Not Modify): Private [1]       Medical History Past Medical History:  Diagnosis Date  . Arthritis    knees  . Blood transfusion without reported diagnosis    during pacemaker surger  . BPH (benign prostatic hyperplasia)   . Cataract    bilateral  . Chronic insomnia 03/31/2016  . COPD (chronic obstructive pulmonary disease) ([place])   . Hyperlipidemia   . Hypertension   . Left-sided weakness 08/28/2016  . Myocardial infarction ([place])   . Pacemaker   . Shortness of breath   . Stroke ([place])   . Substance abuse ([place])   . Tremor, essential 03/31/2016  . Tremors of nervous system   . Tuberculosis    2000     Allergies Allergies  Allergen Reactions  . Penicillins Swelling    Has patient had a PCN reaction causing immediate rash, facial/tongue/throat swelling, SOB or lightheadedness with hypotension: yes Has patient had a PCN reaction causing severe rash involving mucus membranes or skin necrosis: unknown Has patient had a PCN reaction that required hospitalization : yes Has patient had a PCN reaction occurring within the last 10 years: no If all of the above answers are "NO", then may proceed with Cephalosporin use.   Anner Crete In D5w] Hives    IV Location/Drains/Wounds Patient Lines/Drains/Airways Status   Active Line/Drains/Airways    Name:   Placement date:   Placement time:   Site:   Days:   Peripheral IV 02/19/18 Right;Medial Forearm   02/19/18    2115    Forearm   1   Peripheral IV 02/19/18 Left;Posterior Hand   02/19/18    2224    Hand   1          Labs/Imaging Results for orders placed or performed during the hospital encounter of 02/19/18 (from the past 48 hour(s))  CBG monitoring, ED     Status: Abnormal   Collection Time: 02/19/18  8:55 PM  Result Value Ref Range   Glucose-Capillary 139 (H) 65 - 99 mg/dL  Basic metabolic panel     Status: Abnormal   Collection Time: 02/19/18  9:01 PM  Result Value Ref Range   Sodium 136 135 - 145  mmol/L   Potassium 3.8 3.5 - 5.1 mmol/L   Chloride 101 101 - 111 mmol/L   CO2 25 22 - 32 mmol/L   Glucose, Bld 137 (H) 65 - 99 mg/dL   BUN 13 6 - 20 mg/dL   Creatinine, Ser 0.85 0.61 - 1.24 mg/dL   Calcium 9.3 8.9 - 10.3 mg/dL   GFR calc non Af Amer >60 >60 mL/min   GFR calc Af Amer >60 >60 mL/min    Comment: (NOTE) The eGFR has been calculated using the CKD EPI equation. This calculation has not been validated in all clinical situations. eGFR's persistently <60 mL/min signify possible Chronic Kidney Disease.    Anion gap 10 5 - 15    Comment: Performed at [facility], [place] [address]  [place], [place] [address]  CBC with Differential     Status: Abnormal   Collection Time: 02/19/18  9:01 PM  Result Value Ref Range   WBC 10.4 4.0 - 10.5 K/uL   RBC 5.37 4.22 - 5.81 MIL/uL   Hemoglobin 14.8 13.0 - 17.0 g/dL   HCT 44.5 39.0 - 52.0 %   MCV 82.9 78.0 - 100.0 fL   MCH 27.6 26.0 - 34.0 pg   MCHC 33.3 30.0 - 36.0 g/dL   RDW 14.0 11.5 - 15.5 %   Platelets 136 (L) 150 - 400 K/uL   Neutrophils Relative % 81 %   Neutro Abs 8.4 (H) 1.7 - 7.7 K/uL   Lymphocytes Relative 15 %   Lymphs Abs 1.6 0.7 - 4.0 K/uL   Monocytes Relative 4 %   Monocytes Absolute 0.4 0.1 - 1.0 K/uL   Eosinophils Relative 0 %   Eosinophils Absolute 0.0 0.0 - 0.7 K/uL   Basophils Relative 0 %   Basophils Absolute 0.0 0.0 - 0.1 K/uL    Comment: Performed at [facility], [place] [address], [place], [place] [address]  Urinalysis, Routine w reflex microscopic- may I&O cath if menses     Status: Abnormal   Collection Time: 02/19/18  9:03 PM  Result Value Ref Range   Color, Urine YELLOW YELLOW   APPearance HAZY (A) CLEAR   Specific Gravity, Urine 1.018 1.005 - 1.030   pH 5.0 5.0 - 8.0   Glucose, UA 50 (A) NEGATIVE mg/dL   Hgb urine dipstick SMALL (A) NEGATIVE   Bilirubin Urine NEGATIVE NEGATIVE   Ketones, ur NEGATIVE NEGATIVE mg/dL   Protein, ur NEGATIVE NEGATIVE mg/dL   Nitrite NEGATIVE NEGATIVE   Leukocytes, UA LARGE (A) NEGATIVE   RBC / HPF 6-30 0 - 5 RBC/hpf   WBC, UA TOO NUMEROUS TO COUNT 0 - 5 WBC/hpf   Bacteria, UA RARE (A) NONE SEEN   Squamous Epithelial / LPF 0-5 (A) NONE SEEN   Mucus PRESENT     Comment: Performed at [facility], [place] [address], [place], [place] [address]  I-stat troponin, ED     Status: None   Collection Time: 02/19/18  9:05 PM  Result Value Ref Range   Troponin i, poc 0.01 0.00 - 0.08 ng/mL   Comment 3            Comment: Due to the release kinetics of cTnI, a negative result within the first hours of the onset of symptoms does not rule  out myocardial infarction with certainty. If myocardial infarction is still suspected, repeat the test at appropriate intervals.   I-Stat CG4 Lactic Acid, ED     Status: Abnormal  Collection Time: 02/19/18  9:08 PM  Result Value Ref Range   Lactic Acid, Venous 1.97 (H) 0.5 - 1.9 mmol/L  Ammonia     Status: Abnormal   Collection Time: 02/19/18 10:06 PM  Result Value Ref Range   Ammonia 38 (H) 9 - 35 umol/L    Comment: Performed at [facility], [place] [address] [place], [place] [address]  I-Stat CG4 Lactic Acid, ED     Status: None   Collection Time: 02/20/18 12:03 AM  Result Value Ref Range   Lactic Acid, Venous 1.16 0.5 - 1.9 mmol/L   Dg Chest 2 View  Result Date: 02/19/2018 CLINICAL DATA:  Chest pain EXAM: CHEST - 2 VIEW COMPARISON:  02/03/2018, 05/21/2017 FINDINGS: Left-sided pacing device as before. Stable large bulla at the right apex. Scarring in the right mid lung. No acute consolidation or pleural effusion. Stable cardiomediastinal silhouette. No pneumothorax. IMPRESSION: No active cardiopulmonary disease. Stable bullous emphysema in the right lung apex. Electronically Signed   By: Donavan Foil M.D.   On: 02/19/2018 22:00   Ct Head Wo Contrast  Result Date: 02/20/2018 CLINICAL DATA:  Headache EXAM: CT HEAD WITHOUT CONTRAST TECHNIQUE: Contiguous axial images were obtained from the base of the skull through the vertex without intravenous contrast. COMPARISON:  02/03/2018, 10/07/2017 FINDINGS: Brain: No acute territorial infarction, hemorrhage or intracranial mass is visualized. Mild small vessel ischemic changes of the periventricular and deep white matter. Stable ventricle size. Mild atrophy Vascular: No hyperdense vessels.  Carotid vascular calcification Skull: No fracture Sinuses/Orbits: Mucosal thickening in the maxillary and ethmoid sinuses. No acute orbital abnormality. Other: None IMPRESSION: 1. No CT evidence for acute intracranial abnormality. 2. Atrophy and  mild small vessel ischemic changes of the white matter Electronically Signed   By: Donavan Foil M.D.   On: 02/20/2018 01:09   Ct Abdomen Pelvis W Contrast  Result Date: 02/20/2018 CLINICAL DATA:  Acute onset of increased urinary frequency. Pelvic pain and generalized abdominal pain. Headache. EXAM: CT ABDOMEN AND PELVIS WITH CONTRAST TECHNIQUE: Multidetector CT imaging of the abdomen and pelvis was performed using the standard protocol following bolus administration of intravenous contrast. CONTRAST:  16m ISOVUE-300 IOPAMIDOL (ISOVUE-300) INJECTION 61% COMPARISON:  CT of the abdomen and pelvis from 12/19/2016 FINDINGS: Lower chest: Minimal peripheral fibrotic change is noted at the lung bases. Pacemaker leads are partially imaged. Hepatobiliary: The liver is unremarkable in appearance. The gallbladder is unremarkable in appearance. The common bile duct remains normal in caliber. Pancreas: The pancreas is within normal limits. Spleen: A calcified granuloma is noted within the spleen. The spleen is otherwise unremarkable. Adrenals/Urinary Tract: The adrenal glands are unremarkable in appearance. The kidneys are within normal limits. There is no evidence of hydronephrosis. No renal or ureteral stones are identified. No perinephric stranding is seen. Stomach/Bowel: The stomach is unremarkable in appearance. The small bowel is within normal limits. The appendix is normal in caliber, without evidence of appendicitis. Minimal diverticulosis is noted at the mid sigmoid colon, without evidence of diverticulitis. Vascular/Lymphatic: There is mild aneurysmal dilatation of the infrarenal abdominal aorta to 3.1 cm in AP dimension, with minimal mural thrombus. Scattered calcification is seen along the abdominal aorta and its branches. No retroperitoneal or pelvic sidewall lymphadenopathy is seen. Reproductive: Mild bladder wall thickening may reflect cystitis. The prostate is enlarged, measuring 5.9 cm in transverse  dimension. Other: No additional soft tissue abnormalities are seen. Musculoskeletal: Soft No acute osseous abnormalities are identified. The visualized musculature is unremarkable in appearance.  IMPRESSION: 1. Mild bladder wall thickening may reflect cystitis. 2. Enlarged prostate noted. 3. **An incidental finding of potential clinical significance has been found. 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 Scattered aortic atherosclerosis. Recommend followup by [place] in 3 years. This recommendation follows ACR consensus guidelines: White Paper of the ACR Incidental Findings Committee II on Vascular Findings. J Am Coll Radiol 2013; 05:397-673** 4. Minimal peripheral fibrotic change at the lung bases. 5. Minimal diverticulosis at the mid sigmoid colon, without evidence of diverticulitis. Electronically Signed   By: Garald Balding M.D.   On: 02/20/2018 01:18   Dg Abd 2 Views  Result Date: 02/19/2018 CLINICAL DATA:  Urinary frequency and pelvic pain EXAM: ABDOMEN - 2 VIEW COMPARISON:  CT 12/19/2016 FINDINGS: Supine and decubitus views. No free air is seen on decubitus view. Nonobstructed bowel-gas pattern with moderate stool in the colon. IMPRESSION: Nonobstructed bowel-gas pattern. Electronically Signed   By: Donavan Foil M.D.   On: 02/19/2018 22:01    Pending Labs Unresulted Labs (From admission, onward)   Start     Ordered   02/19/18 2134  Blood Culture (routine x 2)  BLOOD CULTURE X 2,   STAT     02/19/18 2135   02/19/18 2047  Urine C&S  Once,   STAT     02/19/18 2046      Vitals/Pain Today's Vitals   02/20/18 0130 02/20/18 0200 02/20/18 0218 02/20/18 0230  BP: (!) 86/47 91/69 108/63 109/61  Pulse: 67 62 67 62  Resp: '17 17 19 ' (!) 27  Temp:      TempSrc:      SpO2: 94% 93% 97% 97%  Weight:      Height:      PainSc:        Isolation Precautions No active isolations  Medications Medications  iopamidol (ISOVUE-300)  61 % injection (not administered)  sodium chloride 0.9 % injection (not administered)  acetaminophen (TYLENOL) tablet 1,000 mg (1,000 mg Oral Given 02/19/18 2158)  levofloxacin (LEVAQUIN) IVPB 750 mg (0 mg Intravenous Stopped 02/19/18 2242)  vancomycin (VANCOCIN) 2,000 mg in sodium chloride 0.9 % 500 mL IVPB (0 mg Intravenous Stopped 02/20/18 0048)  sodium chloride 0.9 % bolus 3,000 mL (0 mLs Intravenous Stopped 02/19/18 2242)  aztreonam (AZACTAM) 2 GM IVPB (0 g Intravenous Stopped 02/19/18 2317)  diphenhydrAMINE (BENADRYL) injection 50 mg (50 mg Intravenous Given 02/19/18 2241)  iopamidol (ISOVUE-300) 61 % injection 100 mL (100 mLs Intravenous Contrast Given 02/20/18 0024)    Mobility walks
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
## 2018-02-20 NOTE — Progress Notes (Signed)
Patient is a 62 year old male with past medical history of COPD, diabetes type 2, sick sinus syndrome status post pacemaker, hypertension, seizure who was brought to the emergency department with altered mental status. Patient was suspected to have urinary tract infection that resulted in metabolic encephalopathy.  Patient was also noted to be febrile.  He was started on broad-spectrum anti-biotics.  CT abdomen was concerning for cystitis. Patient seen and examined the bedside this morning.  His mental status has slightly improved.  We will continue IV antibiotics and IV fluids and continue to monitor the patient. If mental status does not improve and  patient continues to be febrile, will consider for lumbar puncture
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
## 2018-02-20 NOTE — Progress Notes (Signed)
Pharmacy Antibiotic Note  Wesley [name] is a 62 y.o. male admitted on 02/19/2018 with sepsis.  Pharmacy has been consulted for vancomycin and cefepime dosing.  Plan:  Vancomycin 2000 mg IV x1, then vancomycin 1250 mg IV q12h  Cefepime 1 gr IV q8h. Pt has listed allergy to PCN but has tolerated cephalosporins in the past.  Monitor clinical course, renal function, cultures as available   Height: 6\' 2"  (188 cm) Weight: 205 lb 8 oz (93.2 kg) IBW/kg (Calculated) : 82.2  Temp (24hrs), Avg:100.3 F (37.9 C), Min:98.1 F (36.7 C), Max:102.9 F (39.4 C)  Recent Labs  Lab 02/19/18 2101 02/19/18 2108 02/20/18 0003  WBC 10.4  --   --   CREATININE 0.85  --   --   LATICACIDVEN  --  1.97* 1.16    Estimated Creatinine Clearance: 106.1 mL/min (by C-G formula based on SCr of 0.85 mg/dL).    Allergies  Allergen Reactions  . Penicillins Swelling    Has patient had a PCN reaction causing immediate rash, facial/tongue/throat swelling, SOB or lightheadedness with hypotension: yes Has patient had a PCN reaction causing severe rash involving mucus membranes or skin necrosis: unknown Has patient had a PCN reaction that required hospitalization : yes Has patient had a PCN reaction occurring within the last 10 years: no If all of the above answers are "NO", then may proceed with Cephalosporin use.   [name] [Levofloxacin In D5w] Hives    Antimicrobials this admission: 3/8 aztreonam x 1 3/8 vancomycin >>  3/9 cefepime >>   Dose adjustments this admission: ----  Microbiology results: 3/8 BCx: sent   Thank you for allowing pharmacy to be a part of this patient's care.  Royetta Asal, PharmD, BCPS Pager [phone] 02/20/2018 4:58 AM

## 2018-02-21 LAB — BASIC METABOLIC PANEL
ANION GAP: 4 — AB (ref 5–15)
BUN: 9 mg/dL (ref 6–20)
CALCIUM: 8.4 mg/dL — AB (ref 8.9–10.3)
CO2: 25 mmol/L (ref 22–32)
Chloride: 112 mmol/L — ABNORMAL HIGH (ref 101–111)
Creatinine, Ser: 0.75 mg/dL (ref 0.61–1.24)
Glucose, Bld: 145 mg/dL — ABNORMAL HIGH (ref 65–99)
Potassium: 3.8 mmol/L (ref 3.5–5.1)
SODIUM: 141 mmol/L (ref 135–145)

## 2018-02-21 LAB — CBC WITH DIFFERENTIAL/PLATELET
BASOS ABS: 0 10*3/uL (ref 0.0–0.1)
BASOS PCT: 0 %
EOS ABS: 0.1 10*3/uL (ref 0.0–0.7)
Eosinophils Relative: 1 %
HEMATOCRIT: 39.8 % (ref 39.0–52.0)
Hemoglobin: 12.6 g/dL — ABNORMAL LOW (ref 13.0–17.0)
Lymphocytes Relative: 20 %
Lymphs Abs: 1.9 10*3/uL (ref 0.7–4.0)
MCH: 27 pg (ref 26.0–34.0)
MCHC: 31.7 g/dL (ref 30.0–36.0)
MCV: 85.4 fL (ref 78.0–100.0)
MONOS PCT: 6 %
Monocytes Absolute: 0.6 10*3/uL (ref 0.1–1.0)
NEUTROS ABS: 6.6 10*3/uL (ref 1.7–7.7)
Neutrophils Relative %: 73 %
Platelets: 117 10*3/uL — ABNORMAL LOW (ref 150–400)
RBC: 4.66 MIL/uL (ref 4.22–5.81)
RDW: 14.3 % (ref 11.5–15.5)
WBC: 9.3 10*3/uL (ref 4.0–10.5)

## 2018-02-21 LAB — GLUCOSE, CAPILLARY
GLUCOSE-CAPILLARY: 124 mg/dL — AB (ref 65–99)
GLUCOSE-CAPILLARY: 194 mg/dL — AB (ref 65–99)
Glucose-Capillary: 202 mg/dL — ABNORMAL HIGH (ref 65–99)
Glucose-Capillary: 180 mg/dL — ABNORMAL HIGH (ref 65–99)

## 2018-02-21 LAB — HEMOGLOBIN A1C
Hgb A1c MFr Bld: 9.4 % — ABNORMAL HIGH (ref 4.8–5.6)
Mean Plasma Glucose: 223.08 mg/dL

## 2018-02-21 MED ORDER — ATORVASTATIN CALCIUM 40 MG PO TABS
40.0000 mg | ORAL_TABLET | Freq: Every day | ORAL | Status: DC
  Administered 2018-02-21 – 2018-02-22 (×2): 40 mg via ORAL
  Filled 2018-02-21 (×2): qty 1

## 2018-02-21 MED ORDER — TAMSULOSIN HCL 0.4 MG PO CAPS
0.4000 mg | ORAL_CAPSULE | Freq: Every day | ORAL | Status: DC
  Administered 2018-02-21 – 2018-02-22 (×2): 0.4 mg via ORAL
  Filled 2018-02-21 (×2): qty 1

## 2018-02-21 MED ORDER — LEVETIRACETAM 500 MG PO TABS
500.0000 mg | ORAL_TABLET | Freq: Two times a day (BID) | ORAL | Status: DC
  Administered 2018-02-21 – 2018-02-22 (×2): 500 mg via ORAL
  Filled 2018-02-21 (×2): qty 1

## 2018-02-21 MED ORDER — TRAZODONE HCL 50 MG PO TABS
50.0000 mg | ORAL_TABLET | Freq: Once | ORAL | Status: AC
  Administered 2018-02-21: 50 mg via ORAL
  Filled 2018-02-21: qty 1

## 2018-02-21 MED ORDER — INSULIN ASPART 100 UNIT/ML ~~LOC~~ SOLN: 0.0000 [IU] | Freq: Every day | SUBCUTANEOUS | Status: DC

## 2018-02-21 NOTE — Progress Notes (Signed)
PROGRESS NOTE    Wesley [name]  UVO:[id number] DOB: 11/20/1956 DOA: 02/19/2018 PCP: Boykin Nearing, MD   Brief Narrative: Patient is a 62 year old male with past medical history of COPD, diabetes type 2, sick sinus syndrome status post pacemaker, hypertension, seizure who was brought to the emergency department with altered mental status and fever. Patient is currently being treated for urinary tract infection.    Assessment & Plan:   Principal Problem:   Sepsis ([place]) Active Problems:   Essential hypertension   COPD mixed type ([place])   History of substance abuse   SSS (sick sinus syndrome) ([place])   Diabetes mellitus type 2, controlled ([place])   Obstructive sleep apnea   Acute encephalopathy   Acute lower UTI   Seizures ([place])  Sepsis: Most likely secondary to UTI.  Urine culture growing gram-negative rod. Continue cefepime for now.  Will follow sensitivity.  Blood cultures negative. Patient was hypotensive, tachycardic and febrile on presentation.  Vitals are stable now.  Urinary tract infection: Urine culture growing Klebsiella.  CT scan showed  bladder wall thickening suggesting cystitis.  Patient has history of urinary tract infection in the past.  He should follow up  with urology at [facility].  Altered mental status: Secondary to metabolic encephalopathy secondary to sepsis.  Mental status has improved.  We will continue to monitor.  Chest pain: Resolved.  CT chest did not show any significant finding.  H/O Hypertension:Was hypotensive on presentation.  Currently blood pressure stable.  Continue to hold home antihypertensives.  We will continue to monitor his blood pressure.  History of COPD: Currently stable.CT chest showed Large bullae at the right lung apex, with scattered smaller bilateral blebs.No pneumothorax.  Diabetes mellitus type 2: Continue sliding scale insulin.  Will add night coverage.  Not on meds at home.Will check hemoglobin A1c.  History of seizures: Had  last seizure 3 weeks ago.  Follows with neurologist at the [place].  Continue Keppra.  Thrombocytopenia: Currently stable  History of sleep apnea: currently stable.Continue CPAP  History of sick sinus syndrome:Status post pacemaker  BPH: Patient reports problem with urination.  Follows at the [place].  Will add tamsulosin.CT showed enlarged prostate.  Abdominal aortic aneurysm: Incidental finding.Mild aneurysmal dilatation of the infrarenal abdominal aorta to 3.1 cm in AP dimension, with minimal mural thrombus. Scattered aortic atherosclerosis. Recommend followup by [place] in 3 years.    DVT prophylaxis:scd Code Status: Full Family Communication: Discussed with the wife at the bedside Disposition Plan: Home after resolution of altered mental status and UTI  Consultants: None  Procedures: None  Antimicrobials: Cefepime since 02/20/18  Subjective: Patient seen and examined the bedside this morning.  Remains comfortable.  Altered mental status has improved currently he is alert and oriented.  Blood pressure stable  Objective: Vitals:   02/20/18 1320 02/20/18 2010 02/21/18 0606 02/21/18 1327  BP: 118/68 (!) 97/55 136/78 125/84  Pulse: 64 (!) 59 60 60  Resp: 16 18 18 20   Temp: 99.4 F (37.4 C) 98.6 F (37 C) 98.3 F (36.8 C) 98.1 F (36.7 C)  TempSrc: Oral Oral Oral Oral  SpO2: 100% 100% 100% 100%  Weight:   93.7 kg (206 lb 8 oz)   Height:        Intake/Output Summary (Last 24 hours) at 02/21/2018 1344 Last data filed at 02/21/2018 1329 Gross per 24 hour  Intake 3845 ml  Output 3620 ml  Net 225 ml   Filed Weights   02/19/18 2017 02/20/18 0423 02/21/18 0606  Weight: 97.5 kg (215 lb) 93.2 kg (205 lb 8 oz) 93.7 kg (206 lb 8 oz)    Examination:  General exam: Appears calm and comfortable ,Not in distress,average built HEENT:PERRL,Oral mucosa moist, Ear/Nose normal on gross exam Respiratory system: Bilateral equal air entry, normal vesicular breath sounds, no wheezes or crackles    Cardiovascular system: S1 & S2 heard, RRR. No JVD, murmurs, rubs, gallops or clicks. No pedal edema. Gastrointestinal system: Abdomen is nondistended, soft and nontender. No organomegaly or masses felt. Normal bowel sounds heard. Central nervous system: Alert and oriented. No focal neurological deficits. Extremities: No edema, no clubbing ,no cyanosis, distal peripheral pulses palpable. Skin: No rashes, lesions or ulcers,no icterus ,no pallor MSK: Normal muscle bulk,tone ,power Psychiatry: Judgement and insight appear normal. Mood & affect appropriate.     Data Reviewed: I have personally reviewed following labs and imaging studies  CBC: Recent Labs  Lab 02/19/18 2101 02/20/18 0451 02/21/18 0526  WBC 10.4 10.2 9.3  NEUTROABS 8.4* 7.4 6.6  HGB 14.8 12.7* 12.6*  HCT 44.5 39.3 39.8  MCV 82.9 84.2 85.4  PLT 136* 125* 782*   Basic Metabolic Panel: Recent Labs  Lab 02/19/18 2101 02/20/18 0451 02/21/18 0526  NA 136 139 141  K 3.8 3.7 3.8  CL 101 108 112*  CO2 25 24 25   GLUCOSE 137* 161* 145*  BUN 13 11 9   CREATININE 0.85 0.79 0.75  CALCIUM 9.3 8.1* 8.4*   GFR: Estimated Creatinine Clearance: 112.7 mL/min (by C-G formula based on SCr of 0.75 mg/dL). Liver Function Tests: Recent Labs  Lab 02/20/18 0451  AST 13*  ALT 29  ALKPHOS 91  BILITOT 1.3*  PROT 5.9*  ALBUMIN 3.1*   No results for input(s): LIPASE, AMYLASE in the last 168 hours. Recent Labs  Lab 02/19/18 2206  AMMONIA 38*   Coagulation Profile: Recent Labs  Lab 02/20/18 0451  INR 1.19   Cardiac Enzymes: Recent Labs  Lab 02/20/18 0451 02/20/18 0954 02/20/18 1611  TROPONINI <0.03 <0.03 <0.03   BNP (last 3 results) No results for input(s): PROBNP in the last 8760 hours. HbA1C: No results for input(s): HGBA1C in the last 72 hours. CBG: Recent Labs  Lab 02/20/18 1201 02/20/18 1613 02/20/18 2007 02/21/18 0803 02/21/18 1125  GLUCAP 149* 102* 193* 124* 194*   Lipid Profile: No results for  input(s): CHOL, HDL, LDLCALC, TRIG, CHOLHDL, LDLDIRECT in the last 72 hours. Thyroid Function Tests: No results for input(s): TSH, T4TOTAL, FREET4, T3FREE, THYROIDAB in the last 72 hours. Anemia Panel: No results for input(s): VITAMINB12, FOLATE, FERRITIN, TIBC, IRON, RETICCTPCT in the last 72 hours. Sepsis Labs: Recent Labs  Lab 02/19/18 2108 02/20/18 0003 02/20/18 0451 02/20/18 0728  PROCALCITON  --   --  0.57  --   LATICACIDVEN 1.97* 1.16 1.1 1.0    Recent Results (from the past 240 hour(s))  Urine C&S     Status: Abnormal (Preliminary result)   Collection Time: 02/19/18  9:01 PM  Result Value Ref Range Status   Specimen Description   Final    URINE, RANDOM Performed at [facility], [place] [address] [place], [place] [address]    Special Requests   Final    NONE Performed at [facility], [place] [address], [place], [place] [address]    Culture (A)  Final    >=100,000 COLONIES/mL KLEBSIELLA PNEUMONIAE SUSCEPTIBILITIES TO FOLLOW Performed at [facility], [place] [address] [place], [place] [address]    Report  Status PENDING  Incomplete         Radiology Studies: Dg Chest 2 View  Result Date: 02/19/2018 CLINICAL DATA:  Chest pain EXAM: CHEST - 2 VIEW COMPARISON:  02/03/2018, 05/21/2017 FINDINGS: Left-sided pacing device as before. Stable large bulla at the right apex. Scarring in the right mid lung. No acute consolidation or pleural effusion. Stable cardiomediastinal silhouette. No pneumothorax. IMPRESSION: No active cardiopulmonary disease. Stable bullous emphysema in the right lung apex. Electronically Signed   By: Donavan Foil M.D.   On: 02/19/2018 22:00   Ct Head Wo Contrast  Result Date: 02/20/2018 CLINICAL DATA:  Headache EXAM: CT HEAD WITHOUT CONTRAST TECHNIQUE: Contiguous axial images were obtained from the base of the skull through the vertex without intravenous contrast. COMPARISON:  02/03/2018, 10/07/2017 FINDINGS: Brain:  No acute territorial infarction, hemorrhage or intracranial mass is visualized. Mild small vessel ischemic changes of the periventricular and deep white matter. Stable ventricle size. Mild atrophy Vascular: No hyperdense vessels.  Carotid vascular calcification Skull: No fracture Sinuses/Orbits: Mucosal thickening in the maxillary and ethmoid sinuses. No acute orbital abnormality. Other: None IMPRESSION: 1. No CT evidence for acute intracranial abnormality. 2. Atrophy and mild small vessel ischemic changes of the white matter Electronically Signed   By: Donavan Foil M.D.   On: 02/20/2018 01:09   Ct Chest Wo Contrast  Result Date: 02/20/2018 CLINICAL DATA:  Acute onset of shortness of breath. EXAM: CT CHEST WITHOUT CONTRAST TECHNIQUE: Multidetector CT imaging of the chest was performed following the standard protocol without IV contrast. COMPARISON:  Chest radiograph performed 02/19/2017 FINDINGS: Cardiovascular: Diffuse coronary artery calcifications are seen. Pacemaker leads are noted. The heart is normal in size. Calcification is noted along the aortic arch and proximal great vessels. Mediastinum/Nodes: The mediastinum is otherwise unremarkable. No mediastinal lymphadenopathy is seen. No pericardial effusion is identified. The thyroid gland is unremarkable in appearance. No axillary lymphadenopathy is seen. A left-sided chest wall pacemaker is noted. Lungs/Pleura: Large bullae are noted at the right lung apex, with scattered smaller blebs noted bilaterally. Mild atelectasis or scarring is noted at the lung bases. No pleural effusion or pneumothorax is seen. No dominant mass is identified. There is minimal bilateral peripheral nodularity likely reflecting scarring. Upper Abdomen: The visualized portions of the liver and spleen are unremarkable, aside from a calcified granuloma at the spleen. The visualized portions of the adrenal glands and kidneys are within normal limits. Musculoskeletal: No acute osseous  abnormalities are identified. The visualized musculature is unremarkable in appearance. IMPRESSION: 1. Large bullae at the right lung apex, with scattered smaller bilateral blebs. Mild atelectasis or scarring at the lung bases. No airspace consolidation seen. 2. Diffuse coronary artery calcifications. Electronically Signed   By: Garald Balding M.D.   On: 02/20/2018 05:32   Ct Abdomen Pelvis W Contrast  Result Date: 02/20/2018 CLINICAL DATA:  Acute onset of increased urinary frequency. Pelvic pain and generalized abdominal pain. Headache. EXAM: CT ABDOMEN AND PELVIS WITH CONTRAST TECHNIQUE: Multidetector CT imaging of the abdomen and pelvis was performed using the standard protocol following bolus administration of intravenous contrast. CONTRAST:  178mL ISOVUE-300 IOPAMIDOL (ISOVUE-300) INJECTION 61% COMPARISON:  CT of the abdomen and pelvis from 12/19/2016 FINDINGS: Lower chest: Minimal peripheral fibrotic change is noted at the lung bases. Pacemaker leads are partially imaged. Hepatobiliary: The liver is unremarkable in appearance. The gallbladder is unremarkable in appearance. The common bile duct remains normal in caliber. Pancreas: The pancreas is within normal limits. Spleen: A calcified granuloma is noted  within the spleen. The spleen is otherwise unremarkable. Adrenals/Urinary Tract: The adrenal glands are unremarkable in appearance. The kidneys are within normal limits. There is no evidence of hydronephrosis. No renal or ureteral stones are identified. No perinephric stranding is seen. Stomach/Bowel: The stomach is unremarkable in appearance. The small bowel is within normal limits. The appendix is normal in caliber, without evidence of appendicitis. Minimal diverticulosis is noted at the mid sigmoid colon, without evidence of diverticulitis. Vascular/Lymphatic: There is mild aneurysmal dilatation of the infrarenal abdominal aorta to 3.1 cm in AP dimension, with minimal mural thrombus. Scattered  calcification is seen along the abdominal aorta and its branches. No retroperitoneal or pelvic sidewall lymphadenopathy is seen. Reproductive: Mild bladder wall thickening may reflect cystitis. The prostate is enlarged, measuring 5.9 cm in transverse dimension. Other: No additional soft tissue abnormalities are seen. Musculoskeletal: Soft No acute osseous abnormalities are identified. The visualized musculature is unremarkable in appearance. IMPRESSION: 1. Mild bladder wall thickening may reflect cystitis. 2. Enlarged prostate noted. 3. **An incidental finding of potential clinical significance has been found. 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 Scattered aortic atherosclerosis. Recommend followup by [place] in 3 years. This recommendation follows ACR consensus guidelines: White Paper of the ACR Incidental Findings Committee II on Vascular Findings. J Am Coll Radiol 2013; 74:081-448** 4. Minimal peripheral fibrotic change at the lung bases. 5. Minimal diverticulosis at the mid sigmoid colon, without evidence of diverticulitis. Electronically Signed   By: Garald Balding M.D.   On: 02/20/2018 01:18   Dg Abd 2 Views  Result Date: 02/19/2018 CLINICAL DATA:  Urinary frequency and pelvic pain EXAM: ABDOMEN - 2 VIEW COMPARISON:  CT 12/19/2016 FINDINGS: Supine and decubitus views. No free air is seen on decubitus view. Nonobstructed bowel-gas pattern with moderate stool in the colon. IMPRESSION: Nonobstructed bowel-gas pattern. Electronically Signed   By: Donavan Foil M.D.   On: 02/19/2018 22:01        Scheduled Meds: . insulin aspart  0-9 Units Subcutaneous TID WC   Continuous Infusions: . ceFEPime (MAXIPIME) IV 1 g (02/21/18 1325)  . levETIRAcetam Stopped (02/21/18 1104)     LOS: 1 day    Time spent: 25  mins.More than 50% of that time was spent in counseling and/or coordination of care.      Marene Lenz,  MD [facility] Pager [phone]  If 7PM-7AM, please contact night-coverage [email or web address] Password [email or web address] 02/21/2018, 1:44 PM

## 2018-02-22 LAB — URINE CULTURE

## 2018-02-22 LAB — GLUCOSE, CAPILLARY
GLUCOSE-CAPILLARY: 153 mg/dL — AB (ref 65–99)
GLUCOSE-CAPILLARY: 244 mg/dL — AB (ref 65–99)
GLUCOSE-CAPILLARY: 166 mg/dL — AB (ref 65–99)
Glucose-Capillary: 172 mg/dL — ABNORMAL HIGH (ref 65–99)

## 2018-02-22 MED ORDER — METFORMIN HCL 1000 MG PO TABS: 1000.0000 mg | ORAL_TABLET | Freq: Two times a day (BID) | ORAL | 0 refills | Status: DC

## 2018-02-22 MED ORDER — METFORMIN HCL 500 MG PO TABS
1000.0000 mg | ORAL_TABLET | Freq: Two times a day (BID) | ORAL | Status: DC
  Administered 2018-02-22 – 2018-02-23 (×2): 1000 mg via ORAL
  Filled 2018-02-22 (×2): qty 2

## 2018-02-22 MED ORDER — SODIUM CHLORIDE 0.9 % IV SOLN
1.0000 g | INTRAVENOUS | Status: DC
  Administered 2018-02-22: 1 g via INTRAVENOUS
  Filled 2018-02-22: qty 10
  Filled 2018-02-22: qty 1

## 2018-02-22 MED ORDER — TAMSULOSIN HCL 0.4 MG PO CAPS: 0.4000 mg | ORAL_CAPSULE | Freq: Every day | ORAL | 0 refills | Status: DC

## 2018-02-22 MED ORDER — LEVETIRACETAM 500 MG PO TABS: 1000.0000 mg | ORAL_TABLET | Freq: Two times a day (BID) | ORAL | Status: DC

## 2018-02-22 MED ORDER — LEVETIRACETAM 500 MG PO TABS
1000.0000 mg | ORAL_TABLET | Freq: Two times a day (BID) | ORAL | Status: DC
  Administered 2018-02-22 – 2018-02-23 (×2): 1000 mg via ORAL
  Filled 2018-02-22 (×2): qty 2

## 2018-02-22 MED ORDER — CEFDINIR 300 MG PO CAPS: 300.0000 mg | ORAL_CAPSULE | Freq: Two times a day (BID) | ORAL | 0 refills | Status: DC

## 2018-02-22 NOTE — Progress Notes (Signed)
Spoke with Mickel Baas, RN at the [facility] concerning pt needing home O2. HOME O2 orders and O2 Sats would need to faxed to [place] [phone]. Checked with Hinton Dyer, RN, pt's O2 Sats are 96% no O2 needed at present time.

## 2018-02-22 NOTE — Evaluation (Signed)
Physical Therapy Evaluation Patient Details Name: Wesley [name] MRN: [id number] DOB: [birth year] Today's Date: 02/22/2018   History of Present Illness  Patient is a 62 year old male with past medical history of COPD, diabetes type 2, sick sinus syndrome status post pacemaker, hypertension, seizure who was brought to the emergency department 02/19/18 with altered mental status and fever. seizure,, sepsis from UTI.  Clinical Impression  The patient is somewhat impulsive, requires cues to slow down. Limited ambulation as visitor arrived. Will ambulate again today. Pt admitted with above diagnosis. Pt currently with functional limitations due to the deficits listed below (see PT Problem List).  Pt will benefit from skilled PT to increase their independence and safety with mobility to allow discharge to the venue listed below.  '     Follow Up Recommendations No PT follow up    Equipment Recommendations  None recommended by PT    Recommendations for Other Services       Precautions / Restrictions Precautions Precautions: Fall      Mobility  Bed Mobility Overal bed mobility: Independent                Transfers Overall transfer level: Needs assistance   Transfers: Sit to/from Stand Sit to Stand: Min guard         General transfer comment: cues for safety  Ambulation/Gait Ambulation/Gait assistance: Min guard Ambulation Distance (Feet): 20 Feet Assistive device: None Gait Pattern/deviations: Step-through pattern     General Gait Details: cues for safety. to slow down.  Limited due to visitor arriving  Stairs            Wheelchair Mobility    Modified Rankin (Stroke Patients Only)       Balance Overall balance assessment: Needs assistance   Sitting balance-Leahy Scale: Normal     Standing balance support: No upper extremity supported;During functional activity Standing balance-Leahy Scale: Fair Standing balance comment: initially mildly off  balance, improved with  mobility                             Pertinent Vitals/Pain Pain Assessment: No/denies pain    Home Living Family/patient expects to be discharged to:: Private residence Living Arrangements: Spouse/significant other Available Help at Discharge: Family Type of Home: House Home Access: Stairs to enter Entrance Stairs-Rails: Right   Home Layout: One level Home Equipment: Cane - single point;Shower seat      Prior Function Level of Independence: Independent         Comments: uses cane sometimes     Hand Dominance        Extremity/Trunk Assessment   Upper Extremity Assessment Upper Extremity Assessment: Overall WFL for tasks assessed    Lower Extremity Assessment Lower Extremity Assessment: Overall WFL for tasks assessed    Cervical / Trunk Assessment Cervical / Trunk Assessment: Normal  Communication   Communication: No difficulties  Cognition Arousal/Alertness: Awake/alert Behavior During Therapy: Impulsive Overall Cognitive Status: Within Functional Limits for tasks assessed                                        General Comments      Exercises     Assessment/Plan    PT Assessment Patient needs continued PT services  PT Problem List Decreased activity tolerance;Decreased balance;Decreased mobility;Decreased safety awareness       PT Treatment  Interventions DME instruction;Gait training;Functional mobility training;Stair training;Therapeutic exercise;Balance training    PT Goals (Current goals can be found in the Care Plan section)  Acute Rehab PT Goals Patient Stated Goal: to get up and walk PT Goal Formulation: With patient/family Time For Goal Achievement: 03/01/18 Potential to Achieve Goals: Good    Frequency Min 3X/week   Barriers to discharge   wife works    Co-evaluation               AM-PAC PT "6 Clicks" Daily Activity  Outcome Measure Difficulty turning over in bed  (including adjusting bedclothes, sheets and blankets)?: None Difficulty moving from lying on back to sitting on the side of the bed? : None Difficulty sitting down on and standing up from a chair with arms (e.g., wheelchair, bedside commode, etc,.)?: None Help needed moving to and from a bed to chair (including a wheelchair)?: A Little Help needed walking in hospital room?: A Little Help needed climbing 3-5 steps with a railing? : A Lot 6 Click Score: 20    End of Session   Activity Tolerance: Patient tolerated treatment well Patient left: in bed;with bed alarm set;with call bell/phone within reach;with family/visitor present Nurse Communication: Mobility status PT Visit Diagnosis: Unsteadiness on feet (R26.81)    Time: 9373-4287 PT Time Calculation (min) (ACUTE ONLY): 11 min   Charges:   PT Evaluation $PT Eval Low Complexity: 1 Low     PT G CodesTresa Endo PT [phone]   Claretha Cooper 02/22/2018, 1:04 PM

## 2018-02-22 NOTE — Progress Notes (Signed)
SATURATION QUALIFICATIONS: (This note is used to comply with regulatory documentation for home oxygen)  Patient Saturations on Room Air at Rest =93%  Patient Saturations on Room Air while Ambulating = 96%  Patient Saturations on 0 Liters of oxygen while Ambulating = 0%  Please briefly explain why patient needs home oxygen: Only checked saturations off O2 ambulating. Hx of COPD

## 2018-02-22 NOTE — Progress Notes (Signed)
Patient's wife arrived at 6:30 pm to pick pt up for discharge. She has many concerns about taking pt home after he expressed to her that he didn't think a full PT eval had been done. MD notified and pt to stay overnight and have PT eval in the am for discharge needs. Eulas Post, RN

## 2018-02-22 NOTE — Discharge Summary (Addendum)
Physician Discharge Summary  Wesley [name] ZJQ:[id number] DOB: July 25, 1956 DOA: 02/19/2018  PCP: Boykin Nearing, MD  Admit date: 02/19/2018 Discharge date: 02/23/18 Admitted From: Home Disposition:  Home   Discharge Condition:Stable CODE STATUS:Full Diet recommendation: Heart Healthy  And Carb Modified  Brief/Interim Summary:  Patient is a 62 year old male with past medical history of COPD, diabetes type 2, sick sinus syndrome status post pacemaker, hypertension, seizure who was brought to the emergency department with altered mental status and fever. Patient was found to have urinary tract infection and started on antibiotics.  His urine culture grew Klebsiella sensitive to most of the antibiotics.  Patient has history of UTI in the past . Patient's mental status improved within a few days after starting on antibiotics.  Currently he is alert and oriented he is hemodynamically stable.  His blood cultures are negative.  He is afebrile.  Patient is stable to be discharged home today.  He should follow-up with his PCP at [place].  Following problems were addressed during his hospitalization:  Sepsis:Secondary to UTI.  Urine culture grew Klebsiella.  Blood cultures negative. Patient was hypotensive, tachycardic and febrile on presentation.  Vitals are stable now.  Urinary tract infection: Urine culture grew Klebsiella.  CT scan showed  bladder wall thickening suggesting cystitis.  Patient has history of urinary tract infection in the past. He should follow up  with urology at [facility] for the management of recurrent urinary tract infections. Antibiotics changed to oral on discharge.  Altered mental status: Secondary to metabolic encephalopathy secondary to sepsis.  Mental status has improved and is on baseline.  Chest pain: Resolved.  CT chest did not show any significant finding.  H/O Hypertension: Currently blood pressure stable.  Resume his home meds on discharge.  History of COPD:  Currently stable.CT chest showed Large bullae at the right lung apex, with scattered smaller bilateral blebs.No pneumothorax. Continues to smoke few cigarettes a week .Counseled for smoking cessation.  Diabetes mellitus type 2: Hemoglobin A1c found to be 9.4.  Not on any medications at home.  Started on metformin here.  Recommended to check hemoglobin A1c in 3 months. He should follow-up closely with his PCP for further management of his diabetes.  Patient was also complaining of bilateral lower extremity tingling/numbness.  Most likely peripheral neuropathy.  He can be started on gabapentin/pregabalin as per his PCP .we will hold on starting this hear because of his presentation with altered mental status Hhe should follow-up with podiatry as an outpatient for the foot care .Patient also be evaluated by ophthalmology as an outpatient for retinopathy .  History of seizures: Had last seizure 3 weeks ago.  Follows with neurologist at the [place].  Continue Keppra.  Thrombocytopenia: Currently stable  History of sleep apnea: currently stable.Continue CPAP  History of sick sinus syndrome:Status post pacemaker  BPH: Patient reports problem with urination.  Follows at the [place].  Will add tamsulosin.CT showed enlarged prostate.  Abdominal aortic aneurysm: Incidental finding.Mild aneurysmal dilatation of the infrarenal abdominal aorta to 3.1 cm in AP dimension, with minimal mural thrombus. Scattered aortic atherosclerosis. Recommend followup by [place] in 3 years.      Discharge Diagnoses:  Principal Problem:   Sepsis ([place]) Active Problems:   Essential hypertension   COPD mixed type ([place])   History of substance abuse   SSS (sick sinus syndrome) ([place])   Diabetes mellitus type 2, controlled ([place])   Obstructive sleep apnea   Acute encephalopathy   Acute lower UTI   Seizures (  [facility])    Discharge Instructions  Discharge Instructions    Diet - low sodium heart healthy   Complete by:  As  directed    Discharge instructions   Complete by:  As directed    1) Please follow-up with your PCP within a week. Follow-up with your PCP for the management of her diabetes and possible peripheral neuropathy. 2) Follow up with urology as an outpatient for evaluation of recurrent urinary tract infections and enlarged prostate. 3) Follow up with ENT as an outpatient   for evaluation of ringing in your ears and hear loss. 4)You have diabetes and you need to make an appointment with podiatry as an outpatient for your foot care. 5) Check hemoglobin A1c in 3 months. 6) Take prescribed medications as instructed.   Increase activity slowly   Complete by:  As directed      Allergies as of 02/22/2018      Reactions   Penicillins Swelling   Has patient had a PCN reaction causing immediate rash, facial/tongue/throat swelling, SOB or lightheadedness with hypotension: yes Has patient had a PCN reaction causing severe rash involving mucus membranes or skin necrosis: unknown Has patient had a PCN reaction that required hospitalization : yes Has patient had a PCN reaction occurring within the last 10 years: no If all of the above answers are "NO", then may proceed with Cephalosporin use.   Levaquin [levofloxacin In D5w] Hives      Medication List    STOP taking these medications   doxycycline 100 MG capsule Commonly known as:  VIBRAMYCIN   meloxicam 15 MG tablet Commonly known as:  MOBIC   naproxen 500 MG tablet Commonly known as:  NAPROSYN   oseltamivir 75 MG capsule Commonly known as:  TAMIFLU     TAKE these medications   ACCU-CHEK AVIVA PLUS w/Device Kit 1 Device by Does not apply route 4 (four) times daily.   acetic acid 2 % otic solution Place 4 drops into the right ear 3 (three) times daily. For 5 days   albuterol 108 (90 Base) MCG/ACT inhaler Commonly known as:  PROVENTIL HFA;VENTOLIN HFA Inhale 2 puffs into the lungs every 6 (six) hours as needed for wheezing or shortness of  breath.   ARTIFICIAL TEARS OP Place 1 drop into both eyes daily as needed (DRY EYES).   aspirin 325 MG tablet Take 325 mg by mouth daily.   atorvastatin 40 MG tablet Commonly known as:  LIPITOR Take 1 tablet (40 mg total) by mouth daily at 6 PM.   cefdinir 300 MG capsule Commonly known as:  OMNICEF Take 1 capsule (300 mg total) by mouth 2 (two) times daily. Start taking on:  02/23/2018   cetirizine 10 MG tablet Commonly known as:  ZYRTEC Take 1 tablet (10 mg total) by mouth daily.   cyanocobalamin 100 MCG tablet Take 1 tablet (100 mcg total) by mouth daily.   cyclobenzaprine 10 MG tablet Commonly known as:  FLEXERIL Take 1 tablet (10 mg total) by mouth 3 (three) times daily as needed for muscle spasms.   escitalopram 20 MG tablet Commonly known as:  LEXAPRO Take 1 tablet (20 mg total) by mouth daily.   fluticasone furoate-vilanterol 100-25 MCG/INH Aepb Commonly known as:  BREO ELLIPTA Inhale 1 puff into the lungs daily.   glucose blood test strip Commonly known as:  ACCU-CHEK AVIVA Use as instructed   HUGO ROLLING WALKER BASIC Misc 1 each by Does not apply route daily as needed.  [employer] Misc 1 each by Does not apply route as needed.   levETIRAcetam 500 MG tablet Commonly known as:  KEPPRA Take 2 tablets (1,000 mg total) by mouth 2 (two) times daily.   lisinopril 10 MG tablet Commonly known as:  PRINIVIL,ZESTRIL Take 1 tablet (10 mg total) by mouth daily.   metFORMIN 1000 MG tablet Commonly known as:  GLUCOPHAGE Take 1 tablet (1,000 mg total) by mouth 2 (two) times daily with a meal.   pantoprazole 40 MG tablet Commonly known as:  PROTONIX Take 1 tablet (40 mg total) by mouth daily.   tamsulosin 0.4 MG Caps capsule Commonly known as:  FLOMAX Take 1 capsule (0.4 mg total) by mouth daily after supper. What changed:    how much to take  additional instructions   Vitamin D3 2000 units Tabs Take 2,000 Units by mouth daily. What changed:  when to  take this   Wrist Splint/Elastic Right Lg Misc 1 each by Does not apply route at bedtime.   Wrist Splint/Elastic Left Lg Misc 1 each by Does not apply route daily.      Follow-up Information    Boykin Nearing, MD. Schedule an appointment as soon as possible for a visit in 1 week(s).   Specialty:  Family Medicine Contact information: [address] [place] [address] [phone]          Allergies  Allergen Reactions  . Penicillins Swelling    Has patient had a PCN reaction causing immediate rash, facial/tongue/throat swelling, SOB or lightheadedness with hypotension: yes Has patient had a PCN reaction causing severe rash involving mucus membranes or skin necrosis: unknown Has patient had a PCN reaction that required hospitalization : yes Has patient had a PCN reaction occurring within the last 10 years: no If all of the above answers are "NO", then may proceed with Cephalosporin use.   Anner Crete In D5w] Hives    Consultations: None  Procedures/Studies: Dg Chest 2 View  Result Date: 02/19/2018 CLINICAL DATA:  Chest pain EXAM: CHEST - 2 VIEW COMPARISON:  02/03/2018, 05/21/2017 FINDINGS: Left-sided pacing device as before. Stable large bulla at the right apex. Scarring in the right mid lung. No acute consolidation or pleural effusion. Stable cardiomediastinal silhouette. No pneumothorax. IMPRESSION: No active cardiopulmonary disease. Stable bullous emphysema in the right lung apex. Electronically Signed   By: Donavan Foil M.D.   On: 02/19/2018 22:00   Dg Chest 2 View  Result Date: 02/03/2018 CLINICAL DATA:  62 year old male with flu like symptoms for 3 days including fever cough shortness of breath and headache. EXAM: CHEST  2 VIEW COMPARISON:  05/21/2017 and earlier. FINDINGS: Stable lung volumes. Stable left chest cardiac pacemaker. Stable cardiac size and mediastinal contours. Chronic scarring along the right minor fissure. Chronic right upper lung  bullous emphysema. No pneumothorax, pulmonary edema, pleural effusion or acute pulmonary opacity. Visualized tracheal air column is within normal limits. No acute osseous abnormality identified. Negative visible bowel gas pattern. IMPRESSION: Chronic lung disease with Emphysema (ICD10-J43.9). No acute cardiopulmonary abnormality. Electronically Signed   By: Genevie Ann M.D.   On: 02/03/2018 16:18   Ct Head Wo Contrast  Result Date: 02/20/2018 CLINICAL DATA:  Headache EXAM: CT HEAD WITHOUT CONTRAST TECHNIQUE: Contiguous axial images were obtained from the base of the skull through the vertex without intravenous contrast. COMPARISON:  02/03/2018, 10/07/2017 FINDINGS: Brain: No acute territorial infarction, hemorrhage or intracranial mass is visualized. Mild small vessel ischemic changes of the periventricular and deep  white matter. Stable ventricle size. Mild atrophy Vascular: No hyperdense vessels.  Carotid vascular calcification Skull: No fracture Sinuses/Orbits: Mucosal thickening in the maxillary and ethmoid sinuses. No acute orbital abnormality. Other: None IMPRESSION: 1. No CT evidence for acute intracranial abnormality. 2. Atrophy and mild small vessel ischemic changes of the white matter Electronically Signed   By: Donavan Foil M.D.   On: 02/20/2018 01:09   Ct Head Wo Contrast  Result Date: 02/03/2018 CLINICAL DATA:  Altered mental status. EXAM: CT HEAD WITHOUT CONTRAST TECHNIQUE: Contiguous axial images were obtained from the base of the skull through the vertex without intravenous contrast. COMPARISON:  CT scan of October 07, 2017. FINDINGS: Brain: Mild diffuse cortical atrophy is noted. Mild chronic ischemic white matter disease is noted. No mass effect or midline shift is noted. Ventricular size is within normal limits. There is no evidence of mass lesion, hemorrhage or acute infarction. Vascular: No hyperdense vessel or unexpected calcification. Skull: Normal. Negative for fracture or focal lesion.  Sinuses/Orbits: No acute finding. Other: None. IMPRESSION: Mild diffuse cortical atrophy. Mild chronic ischemic white matter disease. No acute intracranial abnormality seen. Electronically Signed   By: Marijo Conception, M.D.   On: 02/03/2018 19:50   Ct Chest Wo Contrast  Result Date: 02/20/2018 CLINICAL DATA:  Acute onset of shortness of breath. EXAM: CT CHEST WITHOUT CONTRAST TECHNIQUE: Multidetector CT imaging of the chest was performed following the standard protocol without IV contrast. COMPARISON:  Chest radiograph performed 02/19/2017 FINDINGS: Cardiovascular: Diffuse coronary artery calcifications are seen. Pacemaker leads are noted. The heart is normal in size. Calcification is noted along the aortic arch and proximal great vessels. Mediastinum/Nodes: The mediastinum is otherwise unremarkable. No mediastinal lymphadenopathy is seen. No pericardial effusion is identified. The thyroid gland is unremarkable in appearance. No axillary lymphadenopathy is seen. A left-sided chest wall pacemaker is noted. Lungs/Pleura: Large bullae are noted at the right lung apex, with scattered smaller blebs noted bilaterally. Mild atelectasis or scarring is noted at the lung bases. No pleural effusion or pneumothorax is seen. No dominant mass is identified. There is minimal bilateral peripheral nodularity likely reflecting scarring. Upper Abdomen: The visualized portions of the liver and spleen are unremarkable, aside from a calcified granuloma at the spleen. The visualized portions of the adrenal glands and kidneys are within normal limits. Musculoskeletal: No acute osseous abnormalities are identified. The visualized musculature is unremarkable in appearance. IMPRESSION: 1. Large bullae at the right lung apex, with scattered smaller bilateral blebs. Mild atelectasis or scarring at the lung bases. No airspace consolidation seen. 2. Diffuse coronary artery calcifications. Electronically Signed   By: Garald Balding M.D.   On:  02/20/2018 05:32   Ct Abdomen Pelvis W Contrast  Result Date: 02/20/2018 CLINICAL DATA:  Acute onset of increased urinary frequency. Pelvic pain and generalized abdominal pain. Headache. EXAM: CT ABDOMEN AND PELVIS WITH CONTRAST TECHNIQUE: Multidetector CT imaging of the abdomen and pelvis was performed using the standard protocol following bolus administration of intravenous contrast. CONTRAST:  170m ISOVUE-300 IOPAMIDOL (ISOVUE-300) INJECTION 61% COMPARISON:  CT of the abdomen and pelvis from 12/19/2016 FINDINGS: Lower chest: Minimal peripheral fibrotic change is noted at the lung bases. Pacemaker leads are partially imaged. Hepatobiliary: The liver is unremarkable in appearance. The gallbladder is unremarkable in appearance. The common bile duct remains normal in caliber. Pancreas: The pancreas is within normal limits. Spleen: A calcified granuloma is noted within the spleen. The spleen is otherwise unremarkable. Adrenals/Urinary Tract: The adrenal glands are unremarkable in  appearance. The kidneys are within normal limits. There is no evidence of hydronephrosis. No renal or ureteral stones are identified. No perinephric stranding is seen. Stomach/Bowel: The stomach is unremarkable in appearance. The small bowel is within normal limits. The appendix is normal in caliber, without evidence of appendicitis. Minimal diverticulosis is noted at the mid sigmoid colon, without evidence of diverticulitis. Vascular/Lymphatic: There is mild aneurysmal dilatation of the infrarenal abdominal aorta to 3.1 cm in AP dimension, with minimal mural thrombus. Scattered calcification is seen along the abdominal aorta and its branches. No retroperitoneal or pelvic sidewall lymphadenopathy is seen. Reproductive: Mild bladder wall thickening may reflect cystitis. The prostate is enlarged, measuring 5.9 cm in transverse dimension. Other: No additional soft tissue abnormalities are seen. Musculoskeletal: Soft No acute osseous  abnormalities are identified. The visualized musculature is unremarkable in appearance. IMPRESSION: 1. Mild bladder wall thickening may reflect cystitis. 2. Enlarged prostate noted. 3. **An incidental finding of potential clinical significance has been found. 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 Scattered aortic atherosclerosis. Recommend followup by [place] in 3 years. This recommendation follows ACR consensus guidelines: White Paper of the ACR Incidental Findings Committee II on Vascular Findings. J Am Coll Radiol 2013; 23:536-144** 4. Minimal peripheral fibrotic change at the lung bases. 5. Minimal diverticulosis at the mid sigmoid colon, without evidence of diverticulitis. Electronically Signed   By: Garald Balding M.D.   On: 02/20/2018 01:18   Dg Abd 2 Views  Result Date: 02/19/2018 CLINICAL DATA:  Urinary frequency and pelvic pain EXAM: ABDOMEN - 2 VIEW COMPARISON:  CT 12/19/2016 FINDINGS: Supine and decubitus views. No free air is seen on decubitus view. Nonobstructed bowel-gas pattern with moderate stool in the colon. IMPRESSION: Nonobstructed bowel-gas pattern. Electronically Signed   By: Donavan Foil M.D.   On: 02/19/2018 22:01   (Echo, Carotid, EGD, Colonoscopy, ERCP)    Subjective: Patient seen and examined the bedside this morning.  Looks comfortable.  Mental status has improved.  Patient evaluated by physical therapy today and was not recommended any specific follow-up.  Discharge Exam: Vitals:   02/22/18 0520 02/22/18 1410  BP: 101/63 131/78  Pulse: 64 65  Resp: 16 20  Temp: 97.9 F (36.6 C) 98.7 F (37.1 C)  SpO2: 98% 99%   Vitals:   02/21/18 2138 02/22/18 0520 02/22/18 0522 02/22/18 1410  BP: 116/65 101/63  131/78  Pulse: 65 64  65  Resp: _0 Temp: 99.7 F (37.6 C) 97.9 F (36.6 C)  98.7 F (37.1 C)  TempSrc: Oral Oral  Oral  SpO2: 100% 98%  99%  Weight:   92.6 kg (204 lb 2.3 oz)    Height:        General: Pt is alert, awake, not in acute distress Cardiovascular: RRR, S1/S2 +, no rubs, no gallops Respiratory: CTA bilaterally, no wheezing, no rhonchi Abdominal: Soft, NT, ND, bowel sounds + Extremities: no edema, no cyanosis    The results of significant diagnostics from this hospitalization (including imaging, microbiology, ancillary and laboratory) are listed below for reference.     Microbiology: Recent Results (from the past 240 hour(s))  Urine C&S     Status: Abnormal   Collection Time: 02/19/18  9:01 PM  Result Value Ref Range Status   Specimen Description   Final    URINE, RANDOM Performed at [facility] [address] [place], [place] [address]    Special  Requests   Final    NONE Performed at [facility], [place] [address] [place], [place] [address]    Culture >=100,000 COLONIES/mL KLEBSIELLA PNEUMONIAE (A)  Final   Report Status 02/22/2018 FINAL  Final   Organism ID, Bacteria KLEBSIELLA PNEUMONIAE (A)  Final      Susceptibility   Klebsiella pneumoniae - MIC*    AMPICILLIN >=32 RESISTANT Resistant     CEFAZOLIN <=4 SENSITIVE Sensitive     CEFTRIAXONE <=1 SENSITIVE Sensitive     CIPROFLOXACIN <=0.25 SENSITIVE Sensitive     GENTAMICIN <=1 SENSITIVE Sensitive     IMIPENEM <=0.25 SENSITIVE Sensitive     NITROFURANTOIN 64 INTERMEDIATE Intermediate     TRIMETH/SULFA <=20 SENSITIVE Sensitive     AMPICILLIN/SULBACTAM >=32 RESISTANT Resistant     PIP/TAZO 16 SENSITIVE Sensitive     Extended ESBL NEGATIVE Sensitive     * >=100,000 COLONIES/mL KLEBSIELLA PNEUMONIAE  Blood Culture (routine x 2)     Status: None (Preliminary result)   Collection Time: 02/19/18 10:06 PM  Result Value Ref Range Status   Specimen Description   Final    BLOOD RIGHT FOREARM Performed at [facility] [address] [place], [place] [address]    Special Requests   Final    BOTTLES DRAWN AEROBIC AND ANAEROBIC  Blood Culture adequate volume Performed at [facility] [address] [place], [place] [address]    Culture   Final    NO GROWTH 2 DAYS Performed at [facility] [address], [place], [place] [address]    Report Status PENDING  Incomplete  Blood Culture (routine x 2)     Status: None (Preliminary result)   Collection Time: 02/19/18 10:06 PM  Result Value Ref Range Status   Specimen Description   Final    BLOOD LEFT ANTECUBITAL Performed at [facility] [address], [place], [place] [address]    Special Requests   Final    BOTTLES DRAWN AEROBIC AND ANAEROBIC Blood Culture adequate volume Performed at [facility] [address] [place], [place] [address]    Culture   Final    NO GROWTH 2 DAYS Performed at [facility] [address], [place], [place] [address]    Report Status PENDING  Incomplete     Labs: BNP (last 3 results) Recent Labs    02/03/18 1813  BNP 5.2   Basic Metabolic Panel: Recent Labs  Lab 02/19/18 2101 02/20/18 0451 02/21/18 0526  NA 136 139 141  K 3.8 3.7 3.8  CL 101 108 112*  CO2 _0 GLUCOSE 137* 161* 145*  BUN _1 CREATININE 0.85 0.79 0.75  CALCIUM 9.3 8.1* 8.4*   Liver Function Tests: Recent Labs  Lab 02/20/18 0451  AST 13*  ALT 29  ALKPHOS 91  BILITOT 1.3*  PROT 5.9*  ALBUMIN 3.1*   No results for input(s): LIPASE, AMYLASE in the last 168 hours. Recent Labs  Lab 02/19/18 2206  AMMONIA 38*   CBC: Recent Labs  Lab 02/19/18 2101 02/20/18 0451 02/21/18 0526  WBC 10.4 10.2 9.3  NEUTROABS 8.4* 7.4 6.6  HGB 14.8 12.7* 12.6*  HCT 44.5 39.3 39.8  MCV 82.9 84.2 85.4  PLT 136* 125* 117*   Cardiac Enzymes: Recent Labs  Lab 02/20/18 0451 02/20/18 0954 02/20/18 1611  TROPONINI <0.03 <0.03 <0.03   BNP: Invalid input(s): POCBNP CBG: Recent Labs  Lab 02/21/18  1125 02/21/18 1644 02/21/18 2133 02/22/18 0800 02/22/18 1135   GLUCAP 194* 202* 180* 153* 244*   D-Dimer No results for input(s): DDIMER in the last 72 hours. Hgb A1c Recent Labs    02/21/18 0523  HGBA1C 9.4*   Lipid Profile No results for input(s): CHOL, HDL, LDLCALC, TRIG, CHOLHDL, LDLDIRECT in the last 72 hours. Thyroid function studies No results for input(s): TSH, T4TOTAL, T3FREE, THYROIDAB in the last 72 hours.  Invalid input(s): FREET3 Anemia work up No results for input(s): VITAMINB12, FOLATE, FERRITIN, TIBC, IRON, RETICCTPCT in the last 72 hours. Urinalysis    Component Value Date/Time   COLORURINE YELLOW 02/19/2018 2103   APPEARANCEUR HAZY (A) 02/19/2018 2103   LABSPEC 1.018 02/19/2018 2103   PHURINE 5.0 02/19/2018 2103   GLUCOSEU 50 (A) 02/19/2018 2103   HGBUR SMALL (A) 02/19/2018 2103   BILIRUBINUR NEGATIVE 02/19/2018 2103   BILIRUBINUR Negative 07/22/2016 1142   KETONESUR NEGATIVE 02/19/2018 2103   PROTEINUR NEGATIVE 02/19/2018 2103   UROBILINOGEN 0.2 07/22/2016 1142   NITRITE NEGATIVE 02/19/2018 2103   LEUKOCYTESUR LARGE (A) 02/19/2018 2103   Sepsis Labs Invalid input(s): PROCALCITONIN,  WBC,  LACTICIDVEN Microbiology Recent Results (from the past 240 hour(s))  Urine C&S     Status: Abnormal   Collection Time: 02/19/18  9:01 PM  Result Value Ref Range Status   Specimen Description   Final    URINE, RANDOM Performed at [facility], [place] [address], [place], [place] [address]    Special Requests   Final    NONE Performed at [facility], [place] [address] [place], [place] [address]    Culture >=100,000 COLONIES/mL KLEBSIELLA PNEUMONIAE (A)  Final   Report Status 02/22/2018 FINAL  Final   Organism ID, Bacteria KLEBSIELLA PNEUMONIAE (A)  Final      Susceptibility   Klebsiella pneumoniae - MIC*    AMPICILLIN >=32 RESISTANT Resistant     CEFAZOLIN <=4 SENSITIVE Sensitive     CEFTRIAXONE <=1 SENSITIVE Sensitive     CIPROFLOXACIN <=0.25 SENSITIVE Sensitive     GENTAMICIN <=1  SENSITIVE Sensitive     IMIPENEM <=0.25 SENSITIVE Sensitive     NITROFURANTOIN 64 INTERMEDIATE Intermediate     TRIMETH/SULFA <=20 SENSITIVE Sensitive     AMPICILLIN/SULBACTAM >=32 RESISTANT Resistant     PIP/TAZO 16 SENSITIVE Sensitive     Extended ESBL NEGATIVE Sensitive     * >=100,000 COLONIES/mL KLEBSIELLA PNEUMONIAE  Blood Culture (routine x 2)     Status: None (Preliminary result)   Collection Time: 02/19/18 10:06 PM  Result Value Ref Range Status   Specimen Description   Final    BLOOD RIGHT FOREARM Performed at [facility] [address], [place], [place] [address]    Special Requests   Final    BOTTLES DRAWN AEROBIC AND ANAEROBIC Blood Culture adequate volume Performed at [facility] [address], [place], [place] [address]    Culture   Final    NO GROWTH 2 DAYS Performed at [facility] [address], [place], [place] [address]    Report Status PENDING  Incomplete  Blood Culture (routine x 2)     Status: None (Preliminary result)   Collection Time: 02/19/18 10:06 PM  Result Value Ref Range Status   Specimen Description   Final    BLOOD LEFT ANTECUBITAL Performed at [facility] [address] [place],  [address]    Special Requests   Final  BOTTLES DRAWN AEROBIC AND ANAEROBIC Blood Culture adequate volume Performed at [facility] [address] [place], [place] [address]    Culture   Final    NO GROWTH 2 DAYS Performed at [facility] [address], [place],  [address]    Report Status PENDING  Incomplete     Time coordinating discharge: Over 30 minutes  SIGNED:   Marene Lenz, MD  [facility] 02/22/2018, 2:50 PM Pager [phone]  If 7PM-7AM, please contact night-coverage [email or web address] Password [email or web address]

## 2018-02-23 ENCOUNTER — Telehealth (HOSPITAL_COMMUNITY): Payer: Self-pay

## 2018-02-23 LAB — GLUCOSE, CAPILLARY: GLUCOSE-CAPILLARY: 151 mg/dL — AB (ref 65–99)

## 2018-02-23 MED ORDER — METFORMIN HCL 1000 MG PO TABS: 1000.0000 mg | ORAL_TABLET | Freq: Two times a day (BID) | ORAL | 0 refills | Status: DC

## 2018-02-23 MED ORDER — PADSORBER BED PAN LINERS MISC: 1.0000 | Freq: Every day | 0 refills | Status: DC

## 2018-02-23 MED ORDER — CEFDINIR 300 MG PO CAPS: 300.0000 mg | ORAL_CAPSULE | Freq: Two times a day (BID) | ORAL | 0 refills | Status: DC

## 2018-02-23 MED ORDER — TAMSULOSIN HCL 0.4 MG PO CAPS: 0.4000 mg | ORAL_CAPSULE | Freq: Every day | ORAL | 0 refills | Status: AC

## 2018-02-23 NOTE — Telephone Encounter (Signed)
[name] from [facility] called and left message in regards to patient. Attempted to call [name] back to inform her that we did receive order and that patient is currently in the hospital - lm on vm.

## 2018-02-23 NOTE — Progress Notes (Signed)
Physical Therapy Treatment Patient Details Name: Wesley [name] MRN: [id number] DOB: Apr 11, 1956 Today's Date: 02/23/2018    History of Present Illness Patient is a 61 year old male with past medical history of COPD, diabetes type 2, sick sinus syndrome status post pacemaker, hypertension, seizure who was brought to the emergency department 02/19/18 with altered mental status and fever. seizure,, sepsis from UTI.    PT Comments    The patient is much less impulsive today. The patient is requesting a RW for home and reports that he is to go to [place] today after DC. Will notify Case Management in regards to RW. The patient also reports that he has OPPT visits already scheduled. Encouraged patient  To attend. Wife not present. Continue PT while in acute care. to    Follow Up Recommendations  Outpatient PT - per patient , is set up.  Equipment Recommendations  Rolling walker with 5" wheels    Recommendations for Other Services       Precautions / Restrictions Precautions Precautions: Fall    Mobility  Bed Mobility Overal bed mobility: Independent                Transfers Overall transfer level: Needs assistance   Transfers: Sit to/from Stand Sit to Stand: Supervision         General transfer comment: no assistance  Ambulation/Gait Ambulation/Gait assistance: Supervision Ambulation Distance (Feet): 60 Feet( x 2) Assistive device: Rolling walker (2 wheeled) Gait Pattern/deviations: Step-through pattern Gait velocity: decreased   General Gait Details: Patient requesting use of RW today. Offwered Cane but desires RW. Gait is slow and steady. Patient reports dizzinees  upon sitting and during ambulation. Rested seated with BP 110/73.    Stairs            Wheelchair Mobility    Modified Rankin (Stroke Patients Only)       Balance Overall balance assessment: Needs assistance   Sitting balance-Leahy Scale: Normal     Standing balance support: No upper  extremity supported;During functional activity Standing balance-Leahy Scale: Fair                              Cognition Arousal/Alertness: Awake/alert                                     General Comments: less impulsive, moving slowly ,      Exercises      General Comments        Pertinent Vitals/Pain Faces Pain Scale: No hurt    Home Living                      Prior Function            PT Goals (current goals can now be found in the care plan section) Progress towards PT goals: Progressing toward goals    Frequency    Min 3X/week      PT Plan Discharge plan needs to be updated;Equipment recommendations need to be updated    Co-evaluation              AM-PAC PT "6 Clicks" Daily Activity  Outcome Measure  Difficulty turning over in bed (including adjusting bedclothes, sheets and blankets)?: None Difficulty moving from lying on back to sitting on the side of the bed? : None Difficulty sitting down on and  standing up from a chair with arms (e.g., wheelchair, bedside commode, etc,.)?: None Help needed moving to and from a bed to chair (including a wheelchair)?: A Little Help needed walking in hospital room?: A Little Help needed climbing 3-5 steps with a railing? : A Lot 6 Click Score: 20    End of Session   Activity Tolerance: Patient tolerated treatment well Patient left: in bed;with call bell/phone within reach;with bed alarm set Nurse Communication: Mobility status PT Visit Diagnosis: Unsteadiness on feet (R26.81)     Time: 2162-4469 PT Time Calculation (min) (ACUTE ONLY): 16 min  Charges:  $Gait Training: 8-22 mins                    G CodesTresa Endo PT [phone]    Claretha Cooper 02/23/2018, 8:47 AM

## 2018-02-23 NOTE — Progress Notes (Addendum)
PROGRESS NOTE    Wesley [name]  FHL:[id number] DOB: 1956-08-28 DOA: 02/19/2018 PCP: Boykin Nearing, MD   Brief Narrative: Patient is a 62 year old male with past medical history of COPD, diabetes type 2, sick sinus syndrome status post pacemaker, hypertension, seizure who was brought to the emergency department with altered mental status and fever. Patient is currently being treated for urinary tract infection.    Assessment & Plan:   Principal Problem:   Sepsis ([place]) Active Problems:   Essential hypertension   COPD mixed type ([place])   History of substance abuse   SSS (sick sinus syndrome) ([place])   Diabetes mellitus type 2, controlled ([place])   Obstructive sleep apnea   Acute encephalopathy   Acute lower UTI   Seizures ([place])  Sepsis: Most likely secondary to UTI.  Urine culture growing gram-negative rod. Continue cefepime for now.  Will follow sensitivity.  Blood cultures negative. Patient was hypotensive, tachycardic and febrile on presentation.  Vitals are stable now.  Urinary tract infection: Urine culture growing Klebsiella.  CT scan showed  bladder wall thickening suggesting cystitis.  Patient has history of urinary tract infection in the past.  He should follow up  with urology at [facility].  Altered mental status: Secondary to metabolic encephalopathy secondary to sepsis.  Mental status has improved.  We will continue to monitor.  Chest pain: Resolved.  CT chest did not show any significant finding.  H/O Hypertension:Was hypotensive on presentation.  Currently blood pressure stable.  Continue to hold home antihypertensives.  We will continue to monitor his blood pressure.  History of COPD: Currently stable.CT chest showed Large bullae at the right lung apex, with scattered smaller bilateral blebs.No pneumothorax.  Diabetes mellitus type 2: Continue sliding scale insulin.  Will add night coverage.  Not on meds at home.Will check hemoglobin A1c.  History of seizures: Had  last seizure 3 weeks ago.  Follows with neurologist at the [place].  Continue Keppra.  Thrombocytopenia: Currently stable  History of sleep apnea: currently stable.Continue CPAP  History of sick sinus syndrome:Status post pacemaker  BPH: Patient reports problem with urination.  Follows at the [place].  Will add tamsulosin.CT showed enlarged prostate.  Abdominal aortic aneurysm: Incidental finding.Mild aneurysmal dilatation of the infrarenal abdominal aorta to 3.1 cm in AP dimension, with minimal mural thrombus. Scattered aortic atherosclerosis. Recommend followup by [place] in 3 years.    DVT prophylaxis:scd Code Status: Full Family Communication: Discussed with the wife at the bedside Disposition Plan: Home after resolution of altered mental status and UTI  Consultants: None  Procedures: None  Antimicrobials: Cefepime since 02/20/18  Subjective: Patient seen and examined the bedside this morning.  Remains comfortable.  Altered mental status has improved currently he is alert and oriented.  Blood pressure stable  Objective: Vitals:   02/22/18 0522 02/22/18 1410 02/22/18 2251 02/23/18 0600  BP:  131/78 118/74 137/76  Pulse:  65 60 64  Resp:  20 18 18   Temp:  98.7 F (37.1 C) 99.4 F (37.4 C) 97.7 F (36.5 C)  TempSrc:  Oral Oral Oral  SpO2:  99% 98% 98%  Weight: 92.6 kg (204 lb 2.3 oz)   93.5 kg (206 lb 2.1 oz)  Height:        Intake/Output Summary (Last 24 hours) at 02/23/2018 1021 Last data filed at 02/23/2018 0939 Gross per 24 hour  Intake 600 ml  Output 1830 ml  Net -1230 ml   Filed Weights   02/21/18 0606 02/22/18 0522 02/23/18 0600  Weight: 93.7 kg (206 lb 8 oz) 92.6 kg (204 lb 2.3 oz) 93.5 kg (206 lb 2.1 oz)    Examination:  General exam: Appears calm and comfortable ,Not in distress,average built HEENT:PERRL,Oral mucosa moist, Ear/Nose normal on gross exam Respiratory system: Bilateral equal air entry, normal vesicular breath sounds, no wheezes or crackles    Cardiovascular system: S1 & S2 heard, RRR. No JVD, murmurs, rubs, gallops or clicks. No pedal edema. Gastrointestinal system: Abdomen is nondistended, soft and nontender. No organomegaly or masses felt. Normal bowel sounds heard. Central nervous system: Alert and oriented. No focal neurological deficits. Extremities: No edema, no clubbing ,no cyanosis, distal peripheral pulses palpable. Skin: No rashes, lesions or ulcers,no icterus ,no pallor MSK: Normal muscle bulk,tone ,power Psychiatry: Judgement and insight appear normal. Mood & affect appropriate.     Data Reviewed: I have personally reviewed following labs and imaging studies  CBC: Recent Labs  Lab 02/19/18 2101 02/20/18 0451 02/21/18 0526  WBC 10.4 10.2 9.3  NEUTROABS 8.4* 7.4 6.6  HGB 14.8 12.7* 12.6*  HCT 44.5 39.3 39.8  MCV 82.9 84.2 85.4  PLT 136* 125* 330*   Basic Metabolic Panel: Recent Labs  Lab 02/19/18 2101 02/20/18 0451 02/21/18 0526  NA 136 139 141  K 3.8 3.7 3.8  CL 101 108 112*  CO2 25 24 25   GLUCOSE 137* 161* 145*  BUN 13 11 9   CREATININE 0.85 0.79 0.75  CALCIUM 9.3 8.1* 8.4*   GFR: Estimated Creatinine Clearance: 112.7 mL/min (by C-G formula based on SCr of 0.75 mg/dL). Liver Function Tests: Recent Labs  Lab 02/20/18 0451  AST 13*  ALT 29  ALKPHOS 91  BILITOT 1.3*  PROT 5.9*  ALBUMIN 3.1*   No results for input(s): LIPASE, AMYLASE in the last 168 hours. Recent Labs  Lab 02/19/18 2206  AMMONIA 38*   Coagulation Profile: Recent Labs  Lab 02/20/18 0451  INR 1.19   Cardiac Enzymes: Recent Labs  Lab 02/20/18 0451 02/20/18 0954 02/20/18 1611  TROPONINI <0.03 <0.03 <0.03   BNP (last 3 results) No results for input(s): PROBNP in the last 8760 hours. HbA1C: Recent Labs    02/21/18 0523  HGBA1C 9.4*   CBG: Recent Labs  Lab 02/22/18 0800 02/22/18 1135 02/22/18 1712 02/22/18 2248 02/23/18 0754  GLUCAP 153* 244* 172* 166* 151*   Lipid Profile: No results for  input(s): CHOL, HDL, LDLCALC, TRIG, CHOLHDL, LDLDIRECT in the last 72 hours. Thyroid Function Tests: No results for input(s): TSH, T4TOTAL, FREET4, T3FREE, THYROIDAB in the last 72 hours. Anemia Panel: No results for input(s): VITAMINB12, FOLATE, FERRITIN, TIBC, IRON, RETICCTPCT in the last 72 hours. Sepsis Labs: Recent Labs  Lab 02/19/18 2108 02/20/18 0003 02/20/18 0451 02/20/18 0728  PROCALCITON  --   --  0.57  --   LATICACIDVEN 1.97* 1.16 1.1 1.0    Recent Results (from the past 240 hour(s))  Urine C&S     Status: Abnormal   Collection Time: 02/19/18  9:01 PM  Result Value Ref Range Status   Specimen Description   Final    URINE, RANDOM Performed at [facility], [place] [address] [place], [place] [address]    Special Requests   Final    NONE Performed at [facility], [place] [address] [place],  [address]    Culture >=100,000 COLONIES/mL KLEBSIELLA PNEUMONIAE (A)  Final   Report Status 02/22/2018 FINAL  Final   Organism ID, Bacteria KLEBSIELLA PNEUMONIAE (A)  Final  Susceptibility   Klebsiella pneumoniae - MIC*    AMPICILLIN >=32 RESISTANT Resistant     CEFAZOLIN <=4 SENSITIVE Sensitive     CEFTRIAXONE <=1 SENSITIVE Sensitive     CIPROFLOXACIN <=0.25 SENSITIVE Sensitive     GENTAMICIN <=1 SENSITIVE Sensitive     IMIPENEM <=0.25 SENSITIVE Sensitive     NITROFURANTOIN 64 INTERMEDIATE Intermediate     TRIMETH/SULFA <=20 SENSITIVE Sensitive     AMPICILLIN/SULBACTAM >=32 RESISTANT Resistant     PIP/TAZO 16 SENSITIVE Sensitive     Extended ESBL NEGATIVE Sensitive     * >=100,000 COLONIES/mL KLEBSIELLA PNEUMONIAE  Blood Culture (routine x 2)     Status: None (Preliminary result)   Collection Time: 02/19/18 10:06 PM  Result Value Ref Range Status   Specimen Description   Final    BLOOD RIGHT FOREARM Performed at [facility] [address], [place], [place] [address]    Special Requests   Final     BOTTLES DRAWN AEROBIC AND ANAEROBIC Blood Culture adequate volume Performed at [facility] [address] [place], [place] [address]    Culture   Final    NO GROWTH 2 DAYS Performed at [facility] [address], [place], [place] [address]    Report Status PENDING  Incomplete  Blood Culture (routine x 2)     Status: None (Preliminary result)   Collection Time: 02/19/18 10:06 PM  Result Value Ref Range Status   Specimen Description   Final    BLOOD LEFT ANTECUBITAL Performed at [facility] [address], [place], [place] [address]    Special Requests   Final    BOTTLES DRAWN AEROBIC AND ANAEROBIC Blood Culture adequate volume Performed at [facility] [address] [place], [place] [address]    Culture   Final    NO GROWTH 2 DAYS Performed at [facility] [address] [place], [place] [address]    Report Status PENDING  Incomplete         Radiology Studies: No results found.      Scheduled Meds: . atorvastatin  40 mg Oral q1800  . insulin aspart  0-5 Units Subcutaneous QHS  . insulin aspart  0-9 Units Subcutaneous TID WC  . levETIRAcetam  1,000 mg Oral BID  . metFORMIN  1,000 mg Oral BID WC  . tamsulosin  0.4 mg Oral QPC supper   Continuous Infusions: . cefTRIAXone (ROCEPHIN)  IV Stopped (02/22/18 1420)     LOS: 3 days    Time spent: 25  mins.More than 50% of that time was spent in counseling and/or coordination of care.      Marene Lenz, MD [facility] Pager [phone]  If 7PM-7AM, please contact night-coverage [email or web address] Password [facility] 02/23/2018, 10:21 AM

## 2018-02-23 NOTE — Telephone Encounter (Signed)
Called and spoke with wife of patient in regards to Pulmonary Rehab - Wife stated patient is currently hospitalized at [facility] due to an infection. Wife mentioned that patient may be going home on O2 temporarily. Patient may be d/c this morning. Passed referral to RN to follow.

## 2018-02-23 NOTE — Progress Notes (Signed)
Patient and his wife given discharge, follow up, and medication instructions, verbalized understanding, IV removed, personal belongings with patient, family requested to speak with MD prior to DC, MD paged to make aware, family to transport home

## 2018-02-25 LAB — CULTURE, BLOOD (ROUTINE X 2)
CULTURE: NO GROWTH
Culture: NO GROWTH
Special Requests: ADEQUATE
Special Requests: ADEQUATE

## 2018-02-26 ENCOUNTER — Telehealth (HOSPITAL_COMMUNITY): Payer: Self-pay

## 2018-03-08 ENCOUNTER — Telehealth (HOSPITAL_COMMUNITY): Payer: Self-pay

## 2018-04-01 ENCOUNTER — Telehealth (HOSPITAL_COMMUNITY): Payer: Self-pay

## 2018-04-01 NOTE — Telephone Encounter (Signed)
Attempted to call patient in regards to Pulmonary Rehab - lm on vm °

## 2018-04-06 ENCOUNTER — Telehealth (HOSPITAL_COMMUNITY): Payer: Self-pay

## 2018-04-06 NOTE — Telephone Encounter (Signed)
2nd attempt to call patient in regards to Pulmonary Rehab - lm on vm. Sending letter. °

## 2018-04-16 ENCOUNTER — Telehealth (HOSPITAL_COMMUNITY): Payer: Self-pay

## 2018-04-16 NOTE — Telephone Encounter (Signed)
3rd attempt to call patient in regards to Pulmonary Rehab - lm on vm °

## 2018-04-24 ENCOUNTER — Emergency Department (HOSPITAL_COMMUNITY): Payer: Medicaid Other

## 2018-04-24 ENCOUNTER — Encounter (HOSPITAL_COMMUNITY): Payer: Self-pay | Admitting: Emergency Medicine

## 2018-04-24 ENCOUNTER — Inpatient Hospital Stay (HOSPITAL_COMMUNITY)
Admission: EM | Admit: 2018-04-24 | Discharge: 2018-04-29 | DRG: 871 | Disposition: A | Discharge status: Home Health | Payer: Medicaid Other | Attending: Internal Medicine | Admitting: Internal Medicine

## 2018-04-24 ENCOUNTER — Other Ambulatory Visit: Payer: Self-pay

## 2018-04-24 ENCOUNTER — Emergency Department (HOSPITAL_COMMUNITY)
Admission: EM | Admit: 2018-04-24 | Discharge: 2018-04-24 | Disposition: A | Discharge status: Home / Self Care | Payer: Self-pay | Attending: Emergency Medicine | Admitting: Emergency Medicine

## 2018-04-24 DIAGNOSIS — F329 Major depressive disorder, single episode, unspecified: Secondary | ICD-10-CM | POA: Diagnosis present

## 2018-04-24 DIAGNOSIS — N400 Enlarged prostate without lower urinary tract symptoms: Secondary | ICD-10-CM | POA: Diagnosis present

## 2018-04-24 DIAGNOSIS — Z87891 Personal history of nicotine dependence: Secondary | ICD-10-CM

## 2018-04-24 DIAGNOSIS — R338 Other retention of urine: Secondary | ICD-10-CM | POA: Diagnosis not present

## 2018-04-24 DIAGNOSIS — Z809 Family history of malignant neoplasm, unspecified: Secondary | ICD-10-CM

## 2018-04-24 DIAGNOSIS — G9340 Encephalopathy, unspecified: Secondary | ICD-10-CM | POA: Diagnosis not present

## 2018-04-24 DIAGNOSIS — Z881 Allergy status to other antibiotic agents status: Secondary | ICD-10-CM | POA: Diagnosis not present

## 2018-04-24 DIAGNOSIS — Z880 Allergy status to penicillin: Secondary | ICD-10-CM

## 2018-04-24 DIAGNOSIS — J449 Chronic obstructive pulmonary disease, unspecified: Secondary | ICD-10-CM | POA: Diagnosis present

## 2018-04-24 DIAGNOSIS — Z79899 Other long term (current) drug therapy: Secondary | ICD-10-CM

## 2018-04-24 DIAGNOSIS — H269 Unspecified cataract: Secondary | ICD-10-CM | POA: Diagnosis present

## 2018-04-24 DIAGNOSIS — B961 Klebsiella pneumoniae [K. pneumoniae] as the cause of diseases classified elsewhere: Secondary | ICD-10-CM | POA: Diagnosis present

## 2018-04-24 DIAGNOSIS — M170 Bilateral primary osteoarthritis of knee: Secondary | ICD-10-CM | POA: Diagnosis present

## 2018-04-24 DIAGNOSIS — Z8673 Personal history of transient ischemic attack (TIA), and cerebral infarction without residual deficits: Secondary | ICD-10-CM | POA: Diagnosis not present

## 2018-04-24 DIAGNOSIS — G9341 Metabolic encephalopathy: Secondary | ICD-10-CM | POA: Diagnosis present

## 2018-04-24 DIAGNOSIS — F0390 Unspecified dementia without behavioral disturbance: Secondary | ICD-10-CM | POA: Diagnosis present

## 2018-04-24 DIAGNOSIS — G250 Essential tremor: Secondary | ICD-10-CM | POA: Diagnosis present

## 2018-04-24 DIAGNOSIS — A419 Sepsis, unspecified organism: Secondary | ICD-10-CM

## 2018-04-24 DIAGNOSIS — Z95810 Presence of automatic (implantable) cardiac defibrillator: Secondary | ICD-10-CM | POA: Diagnosis not present

## 2018-04-24 DIAGNOSIS — N390 Urinary tract infection, site not specified: Secondary | ICD-10-CM | POA: Diagnosis present

## 2018-04-24 DIAGNOSIS — E785 Hyperlipidemia, unspecified: Secondary | ICD-10-CM | POA: Diagnosis present

## 2018-04-24 DIAGNOSIS — E119 Type 2 diabetes mellitus without complications: Secondary | ICD-10-CM

## 2018-04-24 DIAGNOSIS — N401 Enlarged prostate with lower urinary tract symptoms: Secondary | ICD-10-CM | POA: Diagnosis not present

## 2018-04-24 DIAGNOSIS — Z87440 Personal history of urinary (tract) infections: Secondary | ICD-10-CM

## 2018-04-24 DIAGNOSIS — A4159 Other Gram-negative sepsis: Secondary | ICD-10-CM | POA: Diagnosis present

## 2018-04-24 DIAGNOSIS — G8929 Other chronic pain: Secondary | ICD-10-CM | POA: Diagnosis present

## 2018-04-24 DIAGNOSIS — R109 Unspecified abdominal pain: Secondary | ICD-10-CM | POA: Insufficient documentation

## 2018-04-24 DIAGNOSIS — Z7984 Long term (current) use of oral hypoglycemic drugs: Secondary | ICD-10-CM

## 2018-04-24 DIAGNOSIS — E1169 Type 2 diabetes mellitus with other specified complication: Secondary | ICD-10-CM

## 2018-04-24 DIAGNOSIS — R339 Retention of urine, unspecified: Secondary | ICD-10-CM | POA: Insufficient documentation

## 2018-04-24 DIAGNOSIS — R0789 Other chest pain: Secondary | ICD-10-CM | POA: Insufficient documentation

## 2018-04-24 DIAGNOSIS — R413 Other amnesia: Secondary | ICD-10-CM | POA: Diagnosis not present

## 2018-04-24 DIAGNOSIS — E669 Obesity, unspecified: Secondary | ICD-10-CM

## 2018-04-24 DIAGNOSIS — I10 Essential (primary) hypertension: Secondary | ICD-10-CM | POA: Diagnosis present

## 2018-04-24 DIAGNOSIS — R4182 Altered mental status, unspecified: Secondary | ICD-10-CM | POA: Insufficient documentation

## 2018-04-24 DIAGNOSIS — Z5321 Procedure and treatment not carried out due to patient leaving prior to being seen by health care provider: Secondary | ICD-10-CM | POA: Insufficient documentation

## 2018-04-24 DIAGNOSIS — F5104 Psychophysiologic insomnia: Secondary | ICD-10-CM | POA: Diagnosis present

## 2018-04-24 DIAGNOSIS — F419 Anxiety disorder, unspecified: Secondary | ICD-10-CM | POA: Diagnosis present

## 2018-04-24 DIAGNOSIS — I252 Old myocardial infarction: Secondary | ICD-10-CM

## 2018-04-24 DIAGNOSIS — R4781 Slurred speech: Secondary | ICD-10-CM | POA: Insufficient documentation

## 2018-04-24 DIAGNOSIS — G4733 Obstructive sleep apnea (adult) (pediatric): Secondary | ICD-10-CM | POA: Diagnosis present

## 2018-04-24 DIAGNOSIS — Z7982 Long term (current) use of aspirin: Secondary | ICD-10-CM

## 2018-04-24 DIAGNOSIS — Z83511 Family history of glaucoma: Secondary | ICD-10-CM

## 2018-04-24 DIAGNOSIS — R509 Fever, unspecified: Secondary | ICD-10-CM

## 2018-04-24 DIAGNOSIS — R569 Unspecified convulsions: Secondary | ICD-10-CM | POA: Diagnosis present

## 2018-04-24 LAB — URINALYSIS, ROUTINE W REFLEX MICROSCOPIC
BILIRUBIN URINE: NEGATIVE
Glucose, UA: NEGATIVE mg/dL
Ketones, ur: NEGATIVE mg/dL
Nitrite: NEGATIVE
PH: 7 (ref 5.0–8.0)
Protein, ur: NEGATIVE mg/dL
SPECIFIC GRAVITY, URINE: 1.017 (ref 1.005–1.030)

## 2018-04-24 LAB — COMPREHENSIVE METABOLIC PANEL
ALBUMIN: 4 g/dL (ref 3.5–5.0)
ALT: 33 U/L (ref 17–63)
AST: 22 U/L (ref 15–41)
Alkaline Phosphatase: 94 U/L (ref 38–126)
Anion gap: 11 (ref 5–15)
BUN: 15 mg/dL (ref 6–20)
CO2: 24 mmol/L (ref 22–32)
Calcium: 9.1 mg/dL (ref 8.9–10.3)
Chloride: 102 mmol/L (ref 101–111)
Creatinine, Ser: 0.83 mg/dL (ref 0.61–1.24)
GFR calc Af Amer: >60 mL/min (ref >60)
Glucose, Bld: 113 mg/dL — ABNORMAL HIGH (ref 65–99)
POTASSIUM: 3.5 mmol/L (ref 3.5–5.1)
Sodium: 137 mmol/L (ref 135–145)
Total Bilirubin: 1.1 mg/dL (ref 0.3–1.2)
Total Protein: 7.7 g/dL (ref 6.5–8.1)

## 2018-04-24 LAB — CBC WITH DIFFERENTIAL/PLATELET
Basophils Absolute: 0 10*3/uL (ref 0.0–0.1)
Basophils Relative: 0 %
EOS ABS: 0 10*3/uL (ref 0.0–0.7)
EOS PCT: 0 %
HCT: 46.7 % (ref 39.0–52.0)
HEMOGLOBIN: 15.2 g/dL (ref 13.0–17.0)
Lymphocytes Relative: 10 %
Lymphs Abs: 0.7 10*3/uL (ref 0.7–4.0)
MCH: 27.8 pg (ref 26.0–34.0)
MCHC: 32.5 g/dL (ref 30.0–36.0)
MCV: 85.5 fL (ref 78.0–100.0)
Monocytes Absolute: 0.4 10*3/uL (ref 0.1–1.0)
Monocytes Relative: 6 %
NEUTROS PCT: 84 %
Neutro Abs: 5.9 10*3/uL (ref 1.7–7.7)
PLATELETS: 109 10*3/uL — AB (ref 150–400)
RBC: 5.46 MIL/uL (ref 4.22–5.81)
RDW: 15.4 % (ref 11.5–15.5)
WBC: 7 10*3/uL (ref 4.0–10.5)

## 2018-04-24 MED ORDER — PANTOPRAZOLE SODIUM 40 MG PO TBEC
40.0000 mg | DELAYED_RELEASE_TABLET | Freq: Every day | ORAL | Status: DC
  Administered 2018-04-25 – 2018-04-29 (×5): 40 mg via ORAL
  Filled 2018-04-24 (×5): qty 1

## 2018-04-24 MED ORDER — ACETAMINOPHEN 650 MG RE SUPP: 650.0000 mg | Freq: Four times a day (QID) | RECTAL | Status: DC | PRN

## 2018-04-24 MED ORDER — CYCLOBENZAPRINE HCL 10 MG PO TABS
10.0000 mg | ORAL_TABLET | Freq: Three times a day (TID) | ORAL | Status: DC | PRN
  Administered 2018-04-25 (×2): 10 mg via ORAL
  Filled 2018-04-24 (×2): qty 1

## 2018-04-24 MED ORDER — SODIUM CHLORIDE 0.9 % IV BOLUS (SEPSIS)
1000.0000 mL | Freq: Once | INTRAVENOUS | Status: AC
  Administered 2018-04-24: 1000 mL via INTRAVENOUS

## 2018-04-24 MED ORDER — AZTREONAM 2 G IJ SOLR
2.0000 g | Freq: Once | INTRAMUSCULAR | Status: AC
  Administered 2018-04-24: 2 g via INTRAVENOUS
  Filled 2018-04-24: qty 2

## 2018-04-24 MED ORDER — SODIUM CHLORIDE 0.9% FLUSH: 3.0000 mL | INTRAVENOUS | Status: DC | PRN

## 2018-04-24 MED ORDER — INSULIN ASPART 100 UNIT/ML ~~LOC~~ SOLN
0.0000 [IU] | Freq: Three times a day (TID) | SUBCUTANEOUS | Status: DC
  Administered 2018-04-26: 2 [IU] via SUBCUTANEOUS
  Administered 2018-04-28 (×2): 1 [IU] via SUBCUTANEOUS
  Administered 2018-04-29: 2 [IU] via SUBCUTANEOUS

## 2018-04-24 MED ORDER — ACETAMINOPHEN 325 MG PO TABS
650.0000 mg | ORAL_TABLET | Freq: Four times a day (QID) | ORAL | Status: DC | PRN
Start: 2018-04-24 — End: 2018-04-29
  Administered 2018-04-25 – 2018-04-27 (×7): 650 mg via ORAL
  Filled 2018-04-24 (×7): qty 2

## 2018-04-24 MED ORDER — SODIUM CHLORIDE 0.9 % IV SOLN
1.0000 g | Freq: Every day | INTRAVENOUS | Status: DC
  Administered 2018-04-25: 1 g via INTRAVENOUS
  Filled 2018-04-24: qty 10
  Filled 2018-04-24: qty 1

## 2018-04-24 MED ORDER — VITAMIN B-12 100 MCG PO TABS
100.0000 ug | ORAL_TABLET | Freq: Every day | ORAL | Status: DC
  Filled 2018-04-24: qty 1

## 2018-04-24 MED ORDER — FLUTICASONE FUROATE-VILANTEROL 100-25 MCG/INH IN AEPB
1.0000 | INHALATION_SPRAY | Freq: Every day | RESPIRATORY_TRACT | Status: DC
  Administered 2018-04-25 – 2018-04-28 (×4): 1 via RESPIRATORY_TRACT
  Filled 2018-04-24: qty 28

## 2018-04-24 MED ORDER — ASPIRIN 325 MG PO TABS
325.0000 mg | ORAL_TABLET | Freq: Every day | ORAL | Status: DC
  Administered 2018-04-25 – 2018-04-29 (×5): 325 mg via ORAL
  Filled 2018-04-24 (×5): qty 1

## 2018-04-24 MED ORDER — SODIUM CHLORIDE 0.9 % IV SOLN: 250.0000 mL | INTRAVENOUS | Status: DC | PRN

## 2018-04-24 MED ORDER — ESCITALOPRAM OXALATE 20 MG PO TABS
20.0000 mg | ORAL_TABLET | Freq: Every day | ORAL | Status: DC
  Administered 2018-04-25 – 2018-04-29 (×5): 20 mg via ORAL
  Filled 2018-04-24 (×5): qty 1

## 2018-04-24 MED ORDER — ALBUTEROL SULFATE HFA 108 (90 BASE) MCG/ACT IN AERS: 2.0000 | INHALATION_SPRAY | Freq: Four times a day (QID) | RESPIRATORY_TRACT | Status: DC | PRN

## 2018-04-24 MED ORDER — LISINOPRIL 10 MG PO TABS
10.0000 mg | ORAL_TABLET | Freq: Every day | ORAL | Status: DC
  Administered 2018-04-25 – 2018-04-29 (×5): 10 mg via ORAL
  Filled 2018-04-24 (×5): qty 1

## 2018-04-24 MED ORDER — ALBUTEROL SULFATE (2.5 MG/3ML) 0.083% IN NEBU: 2.5000 mg | INHALATION_SOLUTION | Freq: Four times a day (QID) | RESPIRATORY_TRACT | Status: DC | PRN

## 2018-04-24 MED ORDER — ATORVASTATIN CALCIUM 40 MG PO TABS
40.0000 mg | ORAL_TABLET | Freq: Every day | ORAL | Status: DC
  Administered 2018-04-25 – 2018-04-28 (×4): 40 mg via ORAL
  Filled 2018-04-24 (×4): qty 1

## 2018-04-24 MED ORDER — LEVETIRACETAM 500 MG PO TABS
1000.0000 mg | ORAL_TABLET | Freq: Two times a day (BID) | ORAL | Status: DC
  Administered 2018-04-25: 1000 mg via ORAL
  Filled 2018-04-24 (×2): qty 2

## 2018-04-24 MED ORDER — VANCOMYCIN HCL IN DEXTROSE 1-5 GM/200ML-% IV SOLN
1000.0000 mg | Freq: Once | INTRAVENOUS | Status: AC
  Administered 2018-04-24: 1000 mg via INTRAVENOUS
  Filled 2018-04-24: qty 200

## 2018-04-24 MED ORDER — TAMSULOSIN HCL 0.4 MG PO CAPS
0.4000 mg | ORAL_CAPSULE | Freq: Every day | ORAL | Status: DC
  Administered 2018-04-25 – 2018-04-28 (×4): 0.4 mg via ORAL
  Filled 2018-04-24 (×4): qty 1

## 2018-04-24 MED ORDER — SODIUM CHLORIDE 0.9% FLUSH
3.0000 mL | Freq: Two times a day (BID) | INTRAVENOUS | Status: DC
  Administered 2018-04-25 – 2018-04-29 (×5): 3 mL via INTRAVENOUS

## 2018-04-24 NOTE — ED Notes (Signed)
Bed: DP82 Expected date:  Expected time:  Means of arrival:  Comments: Triage

## 2018-04-24 NOTE — ED Triage Notes (Signed)
Pt BIB wife complaining of AMS. Patient was last seen normal yesterday. Patient unable to answer questions other than name and date of birth. Patient also has complaints of urinary retention, abdominal pain and back pain.
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
## 2018-04-24 NOTE — ED Triage Notes (Signed)
Patient presents with altered mental status, slurred and garbled speech, urinary retention and abdominal and chest pain. Patient A&O x2, difficulty following directions and confusion. Last seen normal yesterday.
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
## 2018-04-25 LAB — GLUCOSE, CAPILLARY
GLUCOSE-CAPILLARY: 126 mg/dL — AB (ref 65–99)
Glucose-Capillary: 107 mg/dL — ABNORMAL HIGH (ref 65–99)
Glucose-Capillary: 95 mg/dL (ref 65–99)
Glucose-Capillary: 98 mg/dL (ref 65–99)
Glucose-Capillary: 93 mg/dL (ref 65–99)

## 2018-04-25 LAB — BLOOD CULTURE ID PANEL (REFLEXED)
Acinetobacter baumannii: NOT DETECTED
Candida albicans: NOT DETECTED
Candida glabrata: NOT DETECTED
Candida krusei: NOT DETECTED
Candida parapsilosis: NOT DETECTED
Candida tropicalis: NOT DETECTED
Carbapenem resistance: NOT DETECTED
ENTEROBACTERIACEAE SPECIES: DETECTED — AB
ENTEROCOCCUS SPECIES: NOT DETECTED
ESCHERICHIA COLI: NOT DETECTED
Enterobacter cloacae complex: NOT DETECTED
Haemophilus influenzae: NOT DETECTED
Klebsiella oxytoca: NOT DETECTED
Klebsiella pneumoniae: DETECTED — AB
LISTERIA MONOCYTOGENES: NOT DETECTED
NEISSERIA MENINGITIDIS: NOT DETECTED
Proteus species: NOT DETECTED
Pseudomonas aeruginosa: NOT DETECTED
SERRATIA MARCESCENS: NOT DETECTED
STAPHYLOCOCCUS AUREUS BCID: NOT DETECTED
STAPHYLOCOCCUS SPECIES: NOT DETECTED
STREPTOCOCCUS AGALACTIAE: NOT DETECTED
STREPTOCOCCUS PNEUMONIAE: NOT DETECTED
Streptococcus pyogenes: NOT DETECTED
Streptococcus species: NOT DETECTED

## 2018-04-25 MED ORDER — CYANOCOBALAMIN 1000 MCG/ML IJ SOLN
1000.0000 ug | Freq: Once | INTRAMUSCULAR | Status: AC
  Administered 2018-04-25: 1000 ug via INTRAMUSCULAR
  Filled 2018-04-25: qty 1

## 2018-04-25 MED ORDER — SODIUM CHLORIDE 0.9 % IV SOLN
2.0000 g | Freq: Every day | INTRAVENOUS | Status: DC
  Administered 2018-04-25: 2 g via INTRAVENOUS
  Filled 2018-04-25: qty 2

## 2018-04-25 MED ORDER — IBUPROFEN 200 MG PO TABS: 400.0000 mg | ORAL_TABLET | Freq: Once | ORAL | Status: DC

## 2018-04-25 MED ORDER — LEVETIRACETAM IN NACL 1000 MG/100ML IV SOLN
1000.0000 mg | Freq: Two times a day (BID) | INTRAVENOUS | Status: DC
  Administered 2018-04-25 – 2018-04-28 (×8): 1000 mg via INTRAVENOUS
  Filled 2018-04-25 (×9): qty 100

## 2018-04-25 MED ORDER — KETOROLAC TROMETHAMINE 30 MG/ML IJ SOLN
30.0000 mg | Freq: Once | INTRAMUSCULAR | Status: AC
  Administered 2018-04-25: 30 mg via INTRAVENOUS
  Filled 2018-04-25: qty 1

## 2018-04-25 MED ORDER — SODIUM CHLORIDE 0.9 % IV BOLUS
1000.0000 mL | Freq: Once | INTRAVENOUS | Status: AC
  Administered 2018-04-26: 1000 mL via INTRAVENOUS

## 2018-04-25 MED ORDER — KETOROLAC TROMETHAMINE 30 MG/ML IJ SOLN
30.0000 mg | Freq: Once | INTRAMUSCULAR | Status: AC
  Administered 2018-04-26: 30 mg via INTRAVENOUS
  Filled 2018-04-25: qty 1

## 2018-04-25 NOTE — Progress Notes (Signed)
PHARMACY - PHYSICIAN COMMUNICATION CRITICAL VALUE ALERT - BLOOD CULTURE IDENTIFICATION (BCID)  Wesley [name] is an 62 y.o. male who presented to [facility] on 04/24/2018 with a chief complaint of confusion, UTI  Assessment:  kleb pneumo bacteremia 1 of 2 sets blood cultures, no resistance detected  Name of physician (or Provider) ContactedVenetia Constable  Current antibiotics: Rocephin  Changes to prescribed antibiotics recommended:  Continue Rocephin  No results found for this or any previous visit.  Kara Mead 04/25/2018  3:14 PM

## 2018-04-25 NOTE — Progress Notes (Signed)
Notified MD of 102.5 rectal temp.

## 2018-04-25 NOTE — Progress Notes (Signed)
Pt's temperature, rectally-103.4. Dr Olevia Bowens texted.

## 2018-04-25 NOTE — Progress Notes (Addendum)
[facility] PROGRESS NOTE    Progress Note  Wesley [name]  XBJ:[id number] DOB: Jul 19, 1956 DOA: 04/24/2018 PCP: Boykin Nearing, MD     Brief Narrative:   Wesley [name] is an 62 y.o. male *past medical history significant of COPD mild dementia previous strokes was brought in by his wife for confusion and fevers.  In the ED her temperature was 102, he was found to have a possible UTI  Assessment/Plan:   Acute lower UTI Culture data is pending. He has remained afebrile has no leukocytosis we will continue IV Rocephin.  Use Tylenol for fevers.  Acute encephalopathy: Likely due to infectious etiology CT scan showed no acute findings. Seems to be slightly improved today.  Essential hypertension Resume home meds.  COPD mixed type ([place]) Stable.  BPH (benign prostatic hyperplasia) Continue Flomax.  History of stroke  Diabetes mellitus type 2, controlled ([place]) Continue sliding scale insulin, blood glucose fairly controlled.   DVT prophylaxis: lovenox Family Communication:none Disposition Plan/Barrier to D/C: once cultures date back Code Status:     Code Status Orders  (From admission, onward)        Start     Ordered   04/24/18 2327  Full code  Continuous     04/24/18 2327    Code Status History    Date Active Date Inactive Code Status Order ID Comments User Context   02/20/2018 0405 02/23/2018 1525 Full Code [id number]  [name], MD ED   05/20/2017 0136 05/23/2017 1636 Full Code [id number]  [name], MD ED   12/19/2016 1348 12/22/2016 1702 Full Code [id number]  [name], MD ED   09/16/2016 1910 09/18/2016 1908 Full Code [id number]  [name], MD Inpatient   08/28/2016 0612 08/31/2016 2059 Full Code [id number]  [name], MD Inpatient   04/05/2016 0133 04/06/2016 1643 Full Code [id number]  [name], MD Inpatient        IV Access:    Peripheral IV   Procedures and diagnostic studies:   Ct Head Wo  Contrast  Result Date: 04/24/2018 CLINICAL DATA:  62 year old male with altered mental status for 1 day. EXAM: CT HEAD WITHOUT CONTRAST TECHNIQUE: Contiguous axial images were obtained from the base of the skull through the vertex without intravenous contrast. COMPARISON:  02/20/2018 and prior CTs FINDINGS: Brain: No evidence of acute infarction, hemorrhage, hydrocephalus, extra-axial collection or mass lesion/mass effect. Atrophy and chronic small-vessel white matter ischemic changes are again noted. Vascular: Atherosclerotic calcifications noted. Skull: Normal. Negative for fracture or focal lesion. Sinuses/Orbits: No acute finding. Other: None. IMPRESSION: 1. No evidence of acute intracranial abnormality 2. Atrophy and chronic small-vessel white matter ischemic changes. Electronically Signed   By: Margarette Canada M.D.   On: 04/24/2018 21:45   Dg Chest Port 1 View  Result Date: 04/24/2018 CLINICAL DATA:  62 year old male with fever. EXAM: PORTABLE CHEST 1 VIEW COMPARISON:  Chest CT dated 02/20/2018 FINDINGS: There is emphysema with large right upper lobe bulla. Right mid lung field linear atelectatic changes. There is no focal consolidation, pleural effusion, or pneumothorax. The cardiac silhouette is within normal limits. Left pectoral pacemaker device. No acute osseous pathology. IMPRESSION: No active disease. Electronically Signed   By: Anner Crete M.D.   On: 04/24/2018 21:13     Medical Consultants:    None.  Anti-Infectives:   IV Rocephin  Subjective:    Wesley [name] no new complaints laying comfortably in bed minimally verbal.  Objective:    Vitals:  04/25/18 0241 04/25/18 0438 04/25/18 0618 04/25/18 0624  BP:    129/79  Pulse:    80  Resp:    20  Temp: (!) 102.3 F (39.1 C) (!) 101.2 F (38.4 C) (!) 102.1 F (38.9 C) 99.7 F (37.6 C)  TempSrc: Rectal Rectal Rectal Oral  SpO2:    99%  Weight:   92.1 kg (203 lb 0.7 oz)   Height:   6\' 2"  (1.88 m)      Intake/Output Summary (Last 24 hours) at 04/25/2018 0806 Last data filed at 04/25/2018 0439 Gross per 24 hour  Intake 263 ml  Output 500 ml  Net -237 ml   Filed Weights   04/25/18 0618  Weight: 92.1 kg (203 lb 0.7 oz)    Exam: General exam: In no acute distress. Respiratory system: Good air movement and clear to auscultation. Cardiovascular system: S1 & S2 heard, RRR.  Gastrointestinal system: Abdomen is nondistended, soft and nontender.  Central nervous system: Alert and oriented. No focal neurological deficits. Extremities: No pedal edema. Skin: No rashes, lesions or ulcers Psychiatry: Judgement and insight appear normal. Mood & affect appropriate.    Data Reviewed:    Labs: Basic Metabolic Panel: Recent Labs  Lab 04/24/18 2109  NA 137  K 3.5  CL 102  CO2 24  GLUCOSE 113*  BUN 15  CREATININE 0.83  CALCIUM 9.1   GFR Estimated Creatinine Clearance: 108.7 mL/min (by C-G formula based on SCr of 0.83 mg/dL). Liver Function Tests: Recent Labs  Lab 04/24/18 2109  AST 22  ALT 33  ALKPHOS 94  BILITOT 1.1  PROT 7.7  ALBUMIN 4.0   No results for input(s): LIPASE, AMYLASE in the last 168 hours. No results for input(s): AMMONIA in the last 168 hours. Coagulation profile No results for input(s): INR, PROTIME in the last 168 hours.  CBC: Recent Labs  Lab 04/24/18 2109  WBC 7.0  NEUTROABS 5.9  HGB 15.2  HCT 46.7  MCV 85.5  PLT 109*   Cardiac Enzymes: No results for input(s): CKTOTAL, CKMB, CKMBINDEX, TROPONINI in the last 168 hours. BNP (last 3 results) No results for input(s): PROBNP in the last 8760 hours. CBG: Recent Labs  Lab 04/25/18 0114 04/25/18 0757  GLUCAP 107* 126*   D-Dimer: No results for input(s): DDIMER in the last 72 hours. Hgb A1c: No results for input(s): HGBA1C in the last 72 hours. Lipid Profile: No results for input(s): CHOL, HDL, LDLCALC, TRIG, CHOLHDL, LDLDIRECT in the last 72 hours. Thyroid function studies: No  results for input(s): TSH, T4TOTAL, T3FREE, THYROIDAB in the last 72 hours.  Invalid input(s): FREET3 Anemia work up: No results for input(s): VITAMINB12, FOLATE, FERRITIN, TIBC, IRON, RETICCTPCT in the last 72 hours. Sepsis Labs: Recent Labs  Lab 04/24/18 2109  WBC 7.0   Microbiology No results found for this or any previous visit (from the past 240 hour(s)).   Medications:   . aspirin  325 mg Oral Daily  . atorvastatin  40 mg Oral q1800  . escitalopram  20 mg Oral Daily  . fluticasone furoate-vilanterol  1 puff Inhalation Daily  . insulin aspart  0-9 Units Subcutaneous TID WC  . levETIRAcetam  1,000 mg Oral BID  . lisinopril  10 mg Oral Daily  . pantoprazole  40 mg Oral Daily  . sodium chloride flush  3 mL Intravenous Q12H  . tamsulosin  0.4 mg Oral QPC supper  . cyanocobalamin  100 mcg Oral Daily   Continuous Infusions: . sodium  chloride    . cefTRIAXone (ROCEPHIN)  IV Stopped (04/25/18 0225)     LOS: 1 day   [facility] Pager [phone]  *Please refer to [email or web address], password [email or web address] to get updated schedule on who will round on this patient, as hospitalists switch teams weekly. If 7PM-7AM, please contact night-coverage at [email or web address], password [email or web address] for any overnight needs.  04/25/2018, 8:06 AM

## 2018-04-25 NOTE — Progress Notes (Signed)
ED TO INPATIENT HANDOFF REPORT  Name/Age/Gender Wesley [name] 62 y.o. male  Code Status    Code Status Orders  (From admission, onward)        Start     Ordered   04/24/18 2327  Full code  Continuous     04/24/18 2327    Code Status History    Date Active Date Inactive Code Status Order ID Comments User Context   02/20/2018 0405 02/23/2018 1525 Full Code [id number]  [name], MD ED   05/20/2017 0136 05/23/2017 1636 Full Code [id number]  [name], MD ED   12/19/2016 1348 12/22/2016 1702 Full Code [id number]  [name], MD ED   09/16/2016 1910 09/18/2016 1908 Full Code [id number]  [name], MD Inpatient   08/28/2016 0612 08/31/2016 2059 Full Code [id number]  [name], MD Inpatient   04/05/2016 0133 04/06/2016 1643 Full Code [id number]  [name], MD Inpatient      Home/SNF/Other Home  Chief Complaint altered mental status, fever  Level of Care/Admitting Diagnosis ED Disposition    ED Disposition Condition [place] [100102]  Level of Care: Med-Surg [16]  Diagnosis: UTI (urinary tract infection) [301601]  Admitting Physician: [name] [[id number]]  Attending Physician: Derrill Kay A [[id number]]  Estimated length of stay: past midnight tomorrow  Certification:: I certify this patient will need inpatient services for at least 2 midnights  PT Class (Do Not Modify): Inpatient [101]  PT Acc Code (Do Not Modify): Private [1]       Medical History Past Medical History:  Diagnosis Date  . Arthritis    knees  . Blood transfusion without reported diagnosis    during pacemaker surger  . BPH (benign prostatic hyperplasia)   . Cataract    bilateral  . Chronic insomnia 03/31/2016  . COPD (chronic obstructive pulmonary disease) ([place])   . Hyperlipidemia   . Hypertension   . Left-sided weakness 08/28/2016  . Myocardial infarction ([place])   . Pacemaker   . Shortness of breath   . Stroke  ([place])   . Substance abuse ([place])   . Tremor, essential 03/31/2016  . Tremors of nervous system   . Tuberculosis    2000    Allergies Allergies  Allergen Reactions  . Penicillins Swelling    Has patient had a PCN reaction causing immediate rash, facial/tongue/throat swelling, SOB or lightheadedness with hypotension: yes Has patient had a PCN reaction causing severe rash involving mucus membranes or skin necrosis: unknown Has patient had a PCN reaction that required hospitalization : yes Has patient had a PCN reaction occurring within the last 10 years: no If all of the above answers are "NO", then may proceed with Cephalosporin use.   Anner Crete In D5w] Hives    IV Location/Drains/Wounds Patient Lines/Drains/Airways Status   Active Line/Drains/Airways    Name:   Placement date:   Placement time:   Site:   Days:   Peripheral IV 04/24/18 Anterior;Left Forearm   04/24/18    2230    Forearm   1   Peripheral IV 04/24/18 Posterior;Right Hand   04/24/18    2230    Hand   1          Labs/Imaging Results for orders placed or performed during the hospital encounter of 04/24/18 (from the past 48 hour(s))  Urinalysis, Routine w reflex microscopic     Status: Abnormal   Collection Time: 04/24/18  8:24 PM  Result Value Ref Range   Color, Urine YELLOW YELLOW   APPearance HAZY (A) CLEAR   Specific Gravity, Urine 1.017 1.005 - 1.030   pH 7.0 5.0 - 8.0   Glucose, UA NEGATIVE NEGATIVE mg/dL   Hgb urine dipstick SMALL (A) NEGATIVE   Bilirubin Urine NEGATIVE NEGATIVE   Ketones, ur NEGATIVE NEGATIVE mg/dL   Protein, ur NEGATIVE NEGATIVE mg/dL   Nitrite NEGATIVE NEGATIVE   Leukocytes, UA SMALL (A) NEGATIVE   RBC / HPF 11-20 0 - 5 RBC/hpf   WBC, UA 21-50 0 - 5 WBC/hpf   Bacteria, UA RARE (A) NONE SEEN   Squamous Epithelial / LPF 0-5 0 - 5   Mucus PRESENT     Comment: Performed at [facility], [place] [address], [place], [place] [address]  Comprehensive  metabolic panel     Status: Abnormal   Collection Time: 04/24/18  9:09 PM  Result Value Ref Range   Sodium 137 135 - 145 mmol/L   Potassium 3.5 3.5 - 5.1 mmol/L   Chloride 102 101 - 111 mmol/L   CO2 24 22 - 32 mmol/L   Glucose, Bld 113 (H) 65 - 99 mg/dL   BUN 15 6 - 20 mg/dL   Creatinine, Ser 0.83 0.61 - 1.24 mg/dL   Calcium 9.1 8.9 - 10.3 mg/dL   Total Protein 7.7 6.5 - 8.1 g/dL   Albumin 4.0 3.5 - 5.0 g/dL   AST 22 15 - 41 U/L   ALT 33 17 - 63 U/L   Alkaline Phosphatase 94 38 - 126 U/L   Total Bilirubin 1.1 0.3 - 1.2 mg/dL   GFR calc non Af Amer >60 >60 mL/min   GFR calc Af Amer >60 >60 mL/min    Comment: (NOTE) The eGFR has been calculated using the CKD EPI equation. This calculation has not been validated in all clinical situations. eGFR's persistently <60 mL/min signify possible Chronic Kidney Disease.    Anion gap 11 5 - 15    Comment: Performed at [facility], [place] [address] [place], [place] [address]  CBC with Differential     Status: Abnormal   Collection Time: 04/24/18  9:09 PM  Result Value Ref Range   WBC 7.0 4.0 - 10.5 K/uL   RBC 5.46 4.22 - 5.81 MIL/uL   Hemoglobin 15.2 13.0 - 17.0 g/dL   HCT 46.7 39.0 - 52.0 %   MCV 85.5 78.0 - 100.0 fL   MCH 27.8 26.0 - 34.0 pg   MCHC 32.5 30.0 - 36.0 g/dL   RDW 15.4 11.5 - 15.5 %   Platelets 109 (L) 150 - 400 K/uL    Comment: SPECIMEN CHECKED FOR CLOTS REPEATED TO VERIFY PLATELET COUNT CONFIRMED BY SMEAR    Neutrophils Relative % 84 %   Neutro Abs 5.9 1.7 - 7.7 K/uL   Lymphocytes Relative 10 %   Lymphs Abs 0.7 0.7 - 4.0 K/uL   Monocytes Relative 6 %   Monocytes Absolute 0.4 0.1 - 1.0 K/uL   Eosinophils Relative 0 %   Eosinophils Absolute 0.0 0.0 - 0.7 K/uL   Basophils Relative 0 %   Basophils Absolute 0.0 0.0 - 0.1 K/uL    Comment: Performed at [facility], [place] [address], [place], [place] [address]   Ct Head Wo Contrast  Result Date: 04/24/2018 CLINICAL DATA:   62 year old male with altered mental status for 1 day. EXAM: CT HEAD WITHOUT CONTRAST TECHNIQUE: Contiguous axial images  were obtained from the base of the skull through the vertex without intravenous contrast. COMPARISON:  02/20/2018 and prior CTs FINDINGS: Brain: No evidence of acute infarction, hemorrhage, hydrocephalus, extra-axial collection or mass lesion/mass effect. Atrophy and chronic small-vessel white matter ischemic changes are again noted. Vascular: Atherosclerotic calcifications noted. Skull: Normal. Negative for fracture or focal lesion. Sinuses/Orbits: No acute finding. Other: None. IMPRESSION: 1. No evidence of acute intracranial abnormality 2. Atrophy and chronic small-vessel white matter ischemic changes. Electronically Signed   By: Margarette Canada M.D.   On: 04/24/2018 21:45   Dg Chest Port 1 View  Result Date: 04/24/2018 CLINICAL DATA:  62 year old male with fever. EXAM: PORTABLE CHEST 1 VIEW COMPARISON:  Chest CT dated 02/20/2018 FINDINGS: There is emphysema with large right upper lobe bulla. Right mid lung field linear atelectatic changes. There is no focal consolidation, pleural effusion, or pneumothorax. The cardiac silhouette is within normal limits. Left pectoral pacemaker device. No acute osseous pathology. IMPRESSION: No active disease. Electronically Signed   By: Anner Crete M.D.   On: 04/24/2018 21:13    Pending Labs Unresulted Labs (From admission, onward)   Start     Ordered   04/24/18 2328  Urine culture  Once,   R     04/24/18 2327   04/24/18 2027  Urine culture  STAT,   STAT     04/24/18 2030   04/24/18 2024  Culture, blood (Routine x 2)  BLOOD CULTURE X 2,   STAT     04/24/18 2024      Vitals/Pain Today's Vitals   04/24/18 2200 04/24/18 2230 04/24/18 2330 04/25/18 0034  BP: 118/71 (!) 144/116 138/78   Pulse: 79 83 64   Resp: 18 13 (!) 21   Temp:    99.6 F (37.6 C)  TempSrc:    Oral  SpO2: 95% 97% 97%     Isolation Precautions No active  isolations  Medications Medications  aspirin tablet 325 mg (has no administration in time range)  atorvastatin (LIPITOR) tablet 40 mg (has no administration in time range)  cyclobenzaprine (FLEXERIL) tablet 10 mg (has no administration in time range)  escitalopram (LEXAPRO) tablet 20 mg (has no administration in time range)  fluticasone furoate-vilanterol (BREO ELLIPTA) 100-25 MCG/INH 1 puff (has no administration in time range)  levETIRAcetam (KEPPRA) tablet 1,000 mg (has no administration in time range)  lisinopril (PRINIVIL,ZESTRIL) tablet 10 mg (has no administration in time range)  pantoprazole (PROTONIX) EC tablet 40 mg (has no administration in time range)  tamsulosin (FLOMAX) capsule 0.4 mg (has no administration in time range)  vitamin B-12 (CYANOCOBALAMIN) tablet 100 mcg (has no administration in time range)  sodium chloride flush (NS) 0.9 % injection 3 mL (has no administration in time range)  sodium chloride flush (NS) 0.9 % injection 3 mL (has no administration in time range)  0.9 %  sodium chloride infusion (has no administration in time range)  acetaminophen (TYLENOL) tablet 650 mg (has no administration in time range)    Or  acetaminophen (TYLENOL) suppository 650 mg (has no administration in time range)  insulin aspart (novoLOG) injection 0-9 Units (has no administration in time range)  cefTRIAXone (ROCEPHIN) 1 g in sodium chloride 0.9 % 100 mL IVPB (has no administration in time range)  albuterol (PROVENTIL) (2.5 MG/3ML) 0.083% nebulizer solution 2.5 mg (has no administration in time range)  aztreonam (AZACTAM) 2 g in sodium chloride 0.9 % 100 mL IVPB (0 g Intravenous Stopped 04/24/18 2225)  vancomycin (VANCOCIN) IVPB  1000 mg/200 mL premix (1,000 mg Intravenous New Bag/Given 04/24/18 2136)  sodium chloride 0.9 % bolus 1,000 mL (1,000 mLs Intravenous New Bag/Given 04/24/18 2148)    And  sodium chloride 0.9 % bolus 1,000 mL (1,000 mLs Intravenous New Bag/Given 04/24/18 2149)     And  sodium chloride 0.9 % bolus 1,000 mL (1,000 mLs Intravenous New Bag/Given 04/24/18 2249)    Mobility walks with device

## 2018-04-25 NOTE — Progress Notes (Signed)
MD notified of 101.2 rectal temp( despite tramadol and tylenol).  04/25/2018 Nedra Hai, RN

## 2018-04-26 ENCOUNTER — Inpatient Hospital Stay (HOSPITAL_COMMUNITY): Payer: Medicaid Other

## 2018-04-26 LAB — I-STAT CG4 LACTIC ACID, ED: LACTIC ACID, VENOUS: 1.15 mmol/L (ref 0.5–1.9)

## 2018-04-26 LAB — GLUCOSE, CAPILLARY
GLUCOSE-CAPILLARY: 89 mg/dL (ref 65–99)
GLUCOSE-CAPILLARY: 155 mg/dL — AB (ref 65–99)
Glucose-Capillary: 101 mg/dL — ABNORMAL HIGH (ref 65–99)
Glucose-Capillary: 194 mg/dL — ABNORMAL HIGH (ref 65–99)

## 2018-04-26 MED ORDER — PREMIER PROTEIN SHAKE
11.0000 [oz_av] | Freq: Two times a day (BID) | ORAL | Status: DC
  Administered 2018-04-27 – 2018-04-29 (×4): 11 [oz_av] via ORAL
  Filled 2018-04-26 (×8): qty 325.31

## 2018-04-26 MED ORDER — SODIUM CHLORIDE 0.9 % IV SOLN
2.0000 g | Freq: Three times a day (TID) | INTRAVENOUS | Status: DC
  Administered 2018-04-26 – 2018-04-28 (×7): 2 g via INTRAVENOUS
  Filled 2018-04-26 (×8): qty 2

## 2018-04-26 MED ORDER — POLYETHYLENE GLYCOL 3350 17 G PO PACK
17.0000 g | PACK | Freq: Every day | ORAL | Status: DC
  Administered 2018-04-27 – 2018-04-28 (×2): 17 g via ORAL
  Filled 2018-04-26 (×2): qty 1

## 2018-04-26 MED ORDER — ZOLPIDEM TARTRATE 5 MG PO TABS
5.0000 mg | ORAL_TABLET | Freq: Every evening | ORAL | Status: DC | PRN
  Administered 2018-04-26 – 2018-04-27 (×2): 5 mg via ORAL
  Filled 2018-04-26 (×2): qty 1

## 2018-04-26 MED ORDER — SODIUM CHLORIDE 0.9 % IV SOLN
INTRAVENOUS | Status: DC
  Administered 2018-04-26: 1000 mL via INTRAVENOUS
  Administered 2018-04-26 – 2018-04-28 (×3): via INTRAVENOUS

## 2018-04-26 NOTE — Progress Notes (Addendum)
Initial Nutrition Assessment  Nutrition Brief Note  62 y.o. M admitted on 04/24/18 for confusion and fever due to UTI. Pt with PMH of T2DM, benign prostatic hyperplasia, COPD, htn, hyperlipidemia, L-sided weakness, tremors of nervous system, hx of MI with pacemaker, hx of stroke, arthritis, cataracts, chronic insomia, hx of EtOH abuse until 2016, hx of cocaine/other substances abuse until 2016, and hx of smoking until 2017.  Patient identified on the Malnutrition Screening Tool (MST) Report  Wt Readings from Last 15 Encounters:  04/25/18 203 lb 0.7 oz (92.1 kg)  02/23/18 206 lb 2.1 oz (93.5 kg)  02/03/18 215 lb (97.5 kg)  10/07/17 215 lb (97.5 kg)  08/05/17 215 lb (97.5 kg)  07/03/17 214 lb 3.2 oz (97.2 kg)  05/20/17 206 lb 12.7 oz (93.8 kg)  03/26/17 209 lb 3.2 oz (94.9 kg)  02/16/17 211 lb (95.7 kg)  01/30/17 215 lb 6.4 oz (97.7 kg)  01/21/17 213 lb 12.8 oz (97 kg)  01/19/17 214 lb 12.8 oz (97.4 kg)  12/22/16 215 lb (97.5 kg)  11/18/16 214 lb 6.4 oz (97.3 kg)  11/04/16 212 lb (96.2 kg)   UBW: 206-215 lbs %UBW: 98% Pt with no fat or muscle depletions.  Pt weight loss of 2% in 2 months - not significant.  Pt reports that he lost 10 pounds in 10 days which is not reflected in pt chart. Pt reports that he eats 2-3 meals usually (more than two fistfuls at each meal), but didn't specify food items beyond stating "fast food". Pt reported decreased appetite in those 10 days, but could specify why; suspect this is due to UTI and fever. Pt reports eating 1 meal a day, more than two fistfuls for the past 10 days. Pt did not give detailed nutrition hx or weight hx.   Body mass index is 26.07 kg/m. Patient meets criteria for overweight based on current BMI.   Current diet order is heart healthy/carb modified, patient is consuming approximately unknown% of meals at this time. Labs and medications reviewed.   Premier protein BID each supplement providing 160 kcal and 30 grams of protein; pt  with increased needs due to fever and COPD. If nutrition issues arise, please consult RD.   Hope Budds, Dietetic Intern

## 2018-04-26 NOTE — Progress Notes (Signed)
Pharmacy Antibiotic Note  Wesley [name] is a 62 y.o. male presented to the ED on 04/24/2018 with AMS and fever.  Ceftriaxone started on admission for UTI.  1/2 bcx on 5/11 positive for klebsiella pneumo, UCX with GNR.  Patient remains febrile on 5/13--> to change abx to cefepime.  Today, 04/26/2018: - Tmax 104, wbc wnl  - scr 0.83 (crcl~100)   Plan: - cefepime 2gm IV q8h - f/u cultures ____________________________________  Height: 6\' 2"  (188 cm) Weight: 203 lb 0.7 oz (92.1 kg) IBW/kg (Calculated) : 82.2  Temp (24hrs), Avg:102.2 F (39 C), Min:100.2 F (37.9 C), Max:104 F (40 C)  Recent Labs  Lab 04/24/18 2109  WBC 7.0  CREATININE 0.83    Estimated Creatinine Clearance: 108.7 mL/min (by C-G formula based on SCr of 0.83 mg/dL).    Allergies  Allergen Reactions  . Penicillins Swelling    Has patient had a PCN reaction causing immediate rash, facial/tongue/throat swelling, SOB or lightheadedness with hypotension: yes Has patient had a PCN reaction causing severe rash involving mucus membranes or skin necrosis: unknown Has patient had a PCN reaction that required hospitalization : yes Has patient had a PCN reaction occurring within the last 10 years: no If all of the above answers are "NO", then may proceed with Cephalosporin use.   [name] [Levofloxacin In D5w] Hives   Antimicrobials this admission:  5/12 CTX>>5/13 5/13 cefepime>>  Microbiology results:  5/11 BCx x2: 1/2 GNR - kleb pneumo, no resistance 5/11 UCx: >100K GNR   Thank you for allowing pharmacy to be a part of this patient's care.  Lynelle Doctor 04/26/2018 9:39 AM

## 2018-04-26 NOTE — Progress Notes (Signed)
[facility] PROGRESS NOTE    Progress Note  Wesley [name]  WUJ:[id number] DOB: April 09, 1956 DOA: 04/24/2018 PCP: Boykin Nearing, MD     Brief Narrative:   Wesley [name] is an 62 y.o. male *past medical history significant of COPD mild dementia previous strokes was brought in by his wife for confusion and fevers.  In the ED her temperature was 102, he was found to have a possible UTI  Assessment/Plan:   Acute lower UTI He continues to spike fever, his blood culture reflex ID was positive for Klebsiella pneumonia, urine cultures show more than 100,000 colonies of gram-negative rods. He continues to spike fevers, will change him to IV cefepime check a renal ultrasound. Tinea Tylenol for fevers.  Acute encephalopathy: Likely due to infectious etiology CT scan showed no acute findings. Seems to be slightly improved today.  Essential hypertension Resume home meds.  COPD mixed type ([place]) Stable.  BPH (benign prostatic hyperplasia) Continue Flomax.  History of stroke  Diabetes mellitus type 2, controlled ([place]) Good control of his blood glucose has required no insulin.   DVT prophylaxis: lovenox Family Communication:none Disposition Plan/Barrier to D/C: once cultures date back and he defervesced.  Code Status:     Code Status Orders  (From admission, onward)        Start     Ordered   04/24/18 2327  Full code  Continuous     04/24/18 2327    Code Status History    Date Active Date Inactive Code Status Order ID Comments User Context   02/20/2018 0405 02/23/2018 1525 Full Code [id number]  [name], MD ED   05/20/2017 0136 05/23/2017 1636 Full Code [id number]  [name], MD ED   12/19/2016 1348 12/22/2016 1702 Full Code [id number]  [name], MD ED   09/16/2016 1910 09/18/2016 1908 Full Code [id number]  [name], MD Inpatient   08/28/2016 0612 08/31/2016 2059 Full Code [id number]  [name], MD Inpatient   04/05/2016 0133 04/06/2016  1643 Full Code [id number]  [name], MD Inpatient        IV Access:    Peripheral IV   Procedures and diagnostic studies:   Ct Head Wo Contrast  Result Date: 04/24/2018 CLINICAL DATA:  62 year old male with altered mental status for 1 day. EXAM: CT HEAD WITHOUT CONTRAST TECHNIQUE: Contiguous axial images were obtained from the base of the skull through the vertex without intravenous contrast. COMPARISON:  02/20/2018 and prior CTs FINDINGS: Brain: No evidence of acute infarction, hemorrhage, hydrocephalus, extra-axial collection or mass lesion/mass effect. Atrophy and chronic small-vessel white matter ischemic changes are again noted. Vascular: Atherosclerotic calcifications noted. Skull: Normal. Negative for fracture or focal lesion. Sinuses/Orbits: No acute finding. Other: None. IMPRESSION: 1. No evidence of acute intracranial abnormality 2. Atrophy and chronic small-vessel white matter ischemic changes. Electronically Signed   By: Margarette Canada M.D.   On: 04/24/2018 21:45   Dg Chest Port 1 View  Result Date: 04/24/2018 CLINICAL DATA:  62 year old male with fever. EXAM: PORTABLE CHEST 1 VIEW COMPARISON:  Chest CT dated 02/20/2018 FINDINGS: There is emphysema with large right upper lobe bulla. Right mid lung field linear atelectatic changes. There is no focal consolidation, pleural effusion, or pneumothorax. The cardiac silhouette is within normal limits. Left pectoral pacemaker device. No acute osseous pathology. IMPRESSION: No active disease. Electronically Signed   By: Anner Crete M.D.   On: 04/24/2018 21:13     Medical Consultants:    None.  Anti-Infectives:  IV Rocephin  Subjective:    Wesley [name] no new complains  Objective:    Vitals:   04/25/18 2330 04/26/18 0201 04/26/18 0531 04/26/18 0819  BP:   93/82   Pulse:   65   Resp:   17   Temp: (!) 104 F (40 C) (!) 101.2 F (38.4 C) (!) 102 F (38.9 C)   TempSrc: Rectal Rectal Rectal   SpO2:   100%  92%  Weight:      Height:        Intake/Output Summary (Last 24 hours) at 04/26/2018 0921 Last data filed at 04/26/2018 0500 Gross per 24 hour  Intake 880 ml  Output 870 ml  Net 10 ml   Filed Weights   04/25/18 0618  Weight: 92.1 kg (203 lb 0.7 oz)    Exam: General exam: In no acute distress. Respiratory system: Good air movement and clear to auscultation. Cardiovascular system: S1 & S2 heard, RRR.  Gastrointestinal system: Abdomen is nondistended, soft and nontender.  Central nervous system: Alert and oriented. No focal neurological deficits. Extremities: No pedal edema. Skin: No rashes, lesions or ulcers Psychiatry: Judgement and insight appear normal. Mood & affect appropriate.    Data Reviewed:    Labs: Basic Metabolic Panel: Recent Labs  Lab 04/24/18 2109  NA 137  K 3.5  CL 102  CO2 24  GLUCOSE 113*  BUN 15  CREATININE 0.83  CALCIUM 9.1   GFR Estimated Creatinine Clearance: 108.7 mL/min (by C-G formula based on SCr of 0.83 mg/dL). Liver Function Tests: Recent Labs  Lab 04/24/18 2109  AST 22  ALT 33  ALKPHOS 94  BILITOT 1.1  PROT 7.7  ALBUMIN 4.0   No results for input(s): LIPASE, AMYLASE in the last 168 hours. No results for input(s): AMMONIA in the last 168 hours. Coagulation profile No results for input(s): INR, PROTIME in the last 168 hours.  CBC: Recent Labs  Lab 04/24/18 2109  WBC 7.0  NEUTROABS 5.9  HGB 15.2  HCT 46.7  MCV 85.5  PLT 109*   Cardiac Enzymes: No results for input(s): CKTOTAL, CKMB, CKMBINDEX, TROPONINI in the last 168 hours. BNP (last 3 results) No results for input(s): PROBNP in the last 8760 hours. CBG: Recent Labs  Lab 04/25/18 0757 04/25/18 1154 04/25/18 1631 04/25/18 2248 04/26/18 0803  GLUCAP 126* 95 98 93 101*   D-Dimer: No results for input(s): DDIMER in the last 72 hours. Hgb A1c: No results for input(s): HGBA1C in the last 72 hours. Lipid Profile: No results for input(s): CHOL, HDL, LDLCALC,  TRIG, CHOLHDL, LDLDIRECT in the last 72 hours. Thyroid function studies: No results for input(s): TSH, T4TOTAL, T3FREE, THYROIDAB in the last 72 hours.  Invalid input(s): FREET3 Anemia work up: No results for input(s): VITAMINB12, FOLATE, FERRITIN, TIBC, IRON, RETICCTPCT in the last 72 hours. Sepsis Labs: Recent Labs  Lab 04/24/18 2109  WBC 7.0   Microbiology Recent Results (from the past 240 hour(s))  Urine culture     Status: Abnormal (Preliminary result)   Collection Time: 04/24/18  8:27 PM  Result Value Ref Range Status   Specimen Description   Final    URINE, CATHETERIZED Performed at [facility] [address], [place], [place] [address]    Special Requests   Final    NONE Performed at [facility], [place] [address] [place], [place] [address]    Culture >=100,000 COLONIES/mL GRAM NEGATIVE RODS (A)  Final   Report Status PENDING  Incomplete  Culture, blood (Routine x 2)     Status: Abnormal (Preliminary result)   Collection Time: 04/24/18  8:29 PM  Result Value Ref Range Status   Specimen Description   Final    BLOOD LEFT FOREARM Performed at [facility] [address], [place], [place] [address]    Special Requests   Final    BOTTLES DRAWN AEROBIC AND ANAEROBIC Blood Culture results may not be optimal due to an excessive volume of blood received in culture bottles Performed at [facility] [address], [place], [place] [address]    Culture  Setup Time   Final    GRAM NEGATIVE RODS ANAEROBIC BOTTLE ONLY CRITICAL RESULT CALLED TO, READ BACK BY AND VERIFIED WITH: JLEGGE,PHARMD @1515  04/25/18 BY LHOWARD    Culture (A)  Final    KLEBSIELLA PNEUMONIAE SUSCEPTIBILITIES TO FOLLOW Performed at [facility], [place] [address], [place], [place] [address]    Report Status PENDING  Incomplete  Blood Culture ID Panel (Reflexed)     Status: Abnormal   Collection Time: 04/24/18  8:29 PM    Result Value Ref Range Status   Enterococcus species NOT DETECTED NOT DETECTED Final   Listeria monocytogenes NOT DETECTED NOT DETECTED Final   Staphylococcus species NOT DETECTED NOT DETECTED Final   Staphylococcus aureus NOT DETECTED NOT DETECTED Final   Streptococcus species NOT DETECTED NOT DETECTED Final   Streptococcus agalactiae NOT DETECTED NOT DETECTED Final   Streptococcus pneumoniae NOT DETECTED NOT DETECTED Final   Streptococcus pyogenes NOT DETECTED NOT DETECTED Final   Acinetobacter baumannii NOT DETECTED NOT DETECTED Final   Enterobacteriaceae species DETECTED (A) NOT DETECTED Final    Comment: Enterobacteriaceae represent a large family of gram-negative bacteria, not a single organism. CRITICAL RESULT CALLED TO, READ BACK BY AND VERIFIED WITH: JLEGGE,PHARMD @1512  04/25/18 BY LHOWARD    Enterobacter cloacae complex NOT DETECTED NOT DETECTED Final   Escherichia coli NOT DETECTED NOT DETECTED Final   Klebsiella oxytoca NOT DETECTED NOT DETECTED Final   Klebsiella pneumoniae DETECTED (A) NOT DETECTED Final    Comment: CRITICAL RESULT CALLED TO, READ BACK BY AND VERIFIED WITH: JLEGGE,PHARMD @1513  04/25/18 BY LHOWARD    Proteus species NOT DETECTED NOT DETECTED Final   Serratia marcescens NOT DETECTED NOT DETECTED Final   Carbapenem resistance NOT DETECTED NOT DETECTED Final   Haemophilus influenzae NOT DETECTED NOT DETECTED Final   Neisseria meningitidis NOT DETECTED NOT DETECTED Final   Pseudomonas aeruginosa NOT DETECTED NOT DETECTED Final   Candida albicans NOT DETECTED NOT DETECTED Final   Candida glabrata NOT DETECTED NOT DETECTED Final   Candida krusei NOT DETECTED NOT DETECTED Final   Candida parapsilosis NOT DETECTED NOT DETECTED Final   Candida tropicalis NOT DETECTED NOT DETECTED Final    Comment: Performed at [facility], [place] [address], [place], [place] [address]  Urine culture     Status: Abnormal (Preliminary result)   Collection Time: 04/24/18  11:28 PM  Result Value Ref Range Status   Specimen Description   Final    URINE, CLEAN CATCH Performed at [facility], [place] [address], [place], [place] [address]    Special Requests   Final    NONE Performed at [facility], [place] [address], [place],  [address]    Culture >=100,000 COLONIES/mL GRAM NEGATIVE RODS (A)  Final   Report Status PENDING  Incomplete     Medications:   . aspirin  325 mg Oral Daily  . atorvastatin  40 mg Oral q1800  . escitalopram  20 mg Oral Daily  . fluticasone furoate-vilanterol  1 puff Inhalation Daily  . insulin aspart  0-9 Units Subcutaneous TID WC  . lisinopril  10 mg Oral Daily  . pantoprazole  40 mg Oral Daily  . sodium chloride flush  3 mL Intravenous Q12H  . tamsulosin  0.4 mg Oral QPC supper   Continuous Infusions: . sodium chloride    . sodium chloride    . cefTRIAXone (ROCEPHIN)  IV Stopped (04/25/18 2118)  . levETIRAcetam Stopped (04/25/18 2300)     LOS: 2 days   Charlynne Cousins  [facility] Pager [phone]  *Please refer to [email or web address], password [email or web address] to get updated schedule on who will round on this patient, as hospitalists switch teams weekly. If 7PM-7AM, please contact night-coverage at [email or web address], password [email or web address] for any overnight needs.  04/26/2018, 9:21 AM

## 2018-04-26 NOTE — Progress Notes (Signed)
   04/26/18 1200  Clinical Encounter Type  Visited With Patient  Visit Type Initial;Psychological support;Spiritual support  Referral From Nurse  Consult/Referral To Chaplain  Spiritual Encounters  Spiritual Needs Other (Comment) (Advance Directive )  Stress Factors  Patient Stress Factors Other (Comment) (Advance Directive )  Advance Directives (For Healthcare)  Does Patient Have a Medical Advance Directive? No  Would patient like information on creating a medical advance directive? Yes (Inpatient - patient requests chaplain consult to create a medical advance directive) (Patient Provided Paperwork)   I visited with the patient per Spiritual Care consult. I provided the patient with the Advance Directive paperwork.  Patient would like to discuss at a later time.   Please, contact Spiritual Care for further assistance.   Chaplain Shanon Ace M.Div., [facility]

## 2018-04-26 NOTE — Plan of Care (Signed)
Reviewed plan of care with pt and spouse, specifically safety and importance of notifying RN with any questions or concerns. Both verbalized understanding of education.

## 2018-04-27 LAB — GLUCOSE, CAPILLARY
GLUCOSE-CAPILLARY: 95 mg/dL (ref 65–99)
Glucose-Capillary: 119 mg/dL — ABNORMAL HIGH (ref 65–99)
Glucose-Capillary: 114 mg/dL — ABNORMAL HIGH (ref 65–99)
Glucose-Capillary: 160 mg/dL — ABNORMAL HIGH (ref 65–99)

## 2018-04-27 LAB — CBC WITH DIFFERENTIAL/PLATELET
Basophils Absolute: 0 10*3/uL (ref 0.0–0.1)
Basophils Relative: 0 %
EOS PCT: 1 %
Eosinophils Absolute: 0.1 10*3/uL (ref 0.0–0.7)
HCT: 38 % — ABNORMAL LOW (ref 39.0–52.0)
Hemoglobin: 12.3 g/dL — ABNORMAL LOW (ref 13.0–17.0)
LYMPHS ABS: 1.7 10*3/uL (ref 0.7–4.0)
LYMPHS PCT: 36 %
MCH: 27.5 pg (ref 26.0–34.0)
MCHC: 32.4 g/dL (ref 30.0–36.0)
MCV: 84.8 fL (ref 78.0–100.0)
MONO ABS: 0.6 10*3/uL (ref 0.1–1.0)
Monocytes Relative: 12 %
Neutro Abs: 2.3 10*3/uL (ref 1.7–7.7)
Neutrophils Relative %: 51 %
PLATELETS: 122 10*3/uL — AB (ref 150–400)
RBC: 4.48 MIL/uL (ref 4.22–5.81)
RDW: 15 % (ref 11.5–15.5)
WBC: 4.7 10*3/uL (ref 4.0–10.5)

## 2018-04-27 LAB — URINE CULTURE: Culture: >=100000 — AB

## 2018-04-27 LAB — CULTURE, BLOOD (ROUTINE X 2)

## 2018-04-27 NOTE — Progress Notes (Signed)
[facility] PROGRESS NOTE    Progress Note  [name]  ASN:[id number] DOB: 15-Feb-1956 DOA: 04/24/2018 PCP: Boykin Nearing, MD     Brief Narrative:   Wesley [name] is an 62 y.o. male *past medical history significant of COPD mild dementia previous strokes was brought in by his wife for confusion and fevers.  In the ED her temperature was 102, he was found to have a possible UTI  Assessment/Plan:   Acute lower UTI Fevers are improving, his urine culture grew Klebsiella sensitive to penicillin and Rocephin. Blood culture sensitivities are pending, will continue IV cefepime. Continue Tylenol for fevers, renal ultrasound showed no abscesses.  Acute encephalopathy: Likely due to infectious etiology CT scan showed no acute findings. Seems to be slightly improved today.  Essential hypertension Resume home meds.  COPD mixed type ([place]) Stable.  BPH (benign prostatic hyperplasia) Continue Flomax.  History of stroke  Diabetes mellitus type 2, controlled ([place]) Good control of his blood glucose has required no insulin.   DVT prophylaxis: lovenox Family Communication:none Disposition Plan/Barrier to D/C: Hopefully tomorrow once fevers improved.  And culture data back. Code Status:     Code Status Orders  (From admission, onward)        Start     Ordered   04/24/18 2327  Full code  Continuous     04/24/18 2327    Code Status History    Date Active Date Inactive Code Status Order ID Comments User Context   02/20/2018 0405 02/23/2018 1525 Full Code [id number]  [name], MD ED   05/20/2017 0136 05/23/2017 1636 Full Code [id number]  [name], MD ED   12/19/2016 1348 12/22/2016 1702 Full Code [id number]  [name], MD ED   09/16/2016 1910 09/18/2016 1908 Full Code [id number]  [name], MD Inpatient   08/28/2016 0612 08/31/2016 2059 Full Code [id number]  [name], MD Inpatient   04/05/2016 0133 04/06/2016 1643 Full Code [id number]   [name], MD Inpatient        IV Access:    Peripheral IV   Procedures and diagnostic studies:   US Renal  Result Date: 04/26/2018 CLINICAL DATA:  Fever, hypertension. EXAM: RENAL / URINARY TRACT ULTRASOUND COMPLETE COMPARISON:  CT scan of the abdomen of February 20, 2018 FINDINGS: Right Kidney: Length: 12.8 cm. Echogenicity within normal limits. No mass or hydronephrosis visualized. Left Kidney: Length: 12.3 cm. Echogenicity within normal limits. No mass or hydronephrosis visualized. Bladder: The partially distended urinary bladder is normal. IMPRESSION: There is no acute or significant chronic abnormality of the kidneys or urinary bladder. Electronically Signed   By: David  Martinique M.D.   On: 04/26/2018 11:19     Medical Consultants:    None.  Anti-Infectives:   IV Rocephin  Subjective:    Wesley [name] no new complains  Objective:    Vitals:   04/26/18 2220 04/26/18 2221 04/27/18 0146 04/27/18 0523  BP: (!) 118/54 (!) 118/54  120/84  Pulse: 79 71  64  Resp: 16 16  18   Temp: 99.5 F (37.5 C) 99.5 F (37.5 C) (!) 101.5 F (38.6 C) 98.2 F (36.8 C)  TempSrc: Oral Oral Oral Oral  SpO2: 93% 93%  97%  Weight:      Height:        Intake/Output Summary (Last 24 hours) at 04/27/2018 0848 Last data filed at 04/27/2018 0828 Gross per 24 hour  Intake 3411.33 ml  Output 2925 ml  Net 486.33 ml  Filed Weights   04/25/18 0618  Weight: 92.1 kg (203 lb 0.7 oz)    Exam: General exam: In no acute distress. Respiratory system: Good air movement and clear to auscultation. Cardiovascular system: S1 & S2 heard, RRR.  Gastrointestinal system: Abdomen is nondistended, soft and nontender.  Central nervous system: Alert and oriented. No focal neurological deficits. Extremities: No pedal edema. Skin: No rashes, lesions or ulcers Psychiatry: Judgement and insight appear normal. Mood & affect appropriate.    Data Reviewed:    Labs: Basic Metabolic  Panel: Recent Labs  Lab 04/24/18 2109  NA 137  K 3.5  CL 102  CO2 24  GLUCOSE 113*  BUN 15  CREATININE 0.83  CALCIUM 9.1   GFR Estimated Creatinine Clearance: 108.7 mL/min (by C-G formula based on SCr of 0.83 mg/dL). Liver Function Tests: Recent Labs  Lab 04/24/18 2109  AST 22  ALT 33  ALKPHOS 94  BILITOT 1.1  PROT 7.7  ALBUMIN 4.0   No results for input(s): LIPASE, AMYLASE in the last 168 hours. No results for input(s): AMMONIA in the last 168 hours. Coagulation profile No results for input(s): INR, PROTIME in the last 168 hours.  CBC: Recent Labs  Lab 04/24/18 2109 04/27/18 0808  WBC 7.0 4.7  NEUTROABS 5.9 2.3  HGB 15.2 12.3*  HCT 46.7 38.0*  MCV 85.5 84.8  PLT 109* 122*   Cardiac Enzymes: No results for input(s): CKTOTAL, CKMB, CKMBINDEX, TROPONINI in the last 168 hours. BNP (last 3 results) No results for input(s): PROBNP in the last 8760 hours. CBG: Recent Labs  Lab 04/26/18 0803 04/26/18 1213 04/26/18 1727 04/26/18 2232 04/27/18 0722  GLUCAP 101* 194* 89 155* 95   D-Dimer: No results for input(s): DDIMER in the last 72 hours. Hgb A1c: No results for input(s): HGBA1C in the last 72 hours. Lipid Profile: No results for input(s): CHOL, HDL, LDLCALC, TRIG, CHOLHDL, LDLDIRECT in the last 72 hours. Thyroid function studies: No results for input(s): TSH, T4TOTAL, T3FREE, THYROIDAB in the last 72 hours.  Invalid input(s): FREET3 Anemia work up: No results for input(s): VITAMINB12, FOLATE, FERRITIN, TIBC, IRON, RETICCTPCT in the last 72 hours. Sepsis Labs: Recent Labs  Lab 04/24/18 2109 04/27/18 0808  WBC 7.0 4.7   Microbiology Recent Results (from the past 240 hour(s))  Culture, blood (Routine x 2)     Status: None (Preliminary result)   Collection Time: 04/24/18  8:24 PM  Result Value Ref Range Status   Specimen Description   Final    BLOOD RIGHT HAND Performed at [facility], [place] [address], [place], [place]  [address]    Special Requests   Final    BOTTLES DRAWN AEROBIC AND ANAEROBIC Blood Culture results may not be optimal due to an inadequate volume of blood received in culture bottles Performed at [place] [facility] [address] [place], [place] [address]    Culture   Final    NO GROWTH 1 DAY Performed at [facility], [place] [address] [place], [place] [address]    Report Status PENDING  Incomplete  Urine culture     Status: Abnormal (Preliminary result)   Collection Time: 04/24/18  8:27 PM  Result Value Ref Range Status   Specimen Description   Final    URINE, CATHETERIZED Performed at [place] [address], [place], [place] [address]    Special Requests   Final    NONE Performed at [facility], [address] [place], [place] [address]    Culture >=100,000 COLONIES/mL KLEBSIELLA PNEUMONIAE (A)  Final   Report Status PENDING  Incomplete  Culture, blood (Routine x 2)     Status: Abnormal (Preliminary result)   Collection Time: 04/24/18  8:29 PM  Result Value Ref Range Status   Specimen Description   Final    BLOOD LEFT FOREARM Performed at [facility] [address], [place], [place] [address]    Special Requests   Final    BOTTLES DRAWN AEROBIC AND ANAEROBIC Blood Culture results may not be optimal due to an excessive volume of blood received in culture bottles Performed at [facility] [address], [place], [place] [address]    Culture  Setup Time   Final    GRAM NEGATIVE RODS IN BOTH AEROBIC AND ANAEROBIC BOTTLES CRITICAL RESULT CALLED TO, READ BACK BY AND VERIFIED WITH: JLEGGE,PHARMD @1515  04/25/18 BY LHOWARD    Culture (A)  Final    KLEBSIELLA PNEUMONIAE SUSCEPTIBILITIES TO FOLLOW Performed at [facility], [place] [address], [place], [place] [address]    Report Status PENDING  Incomplete  Blood Culture ID Panel (Reflexed)     Status: Abnormal   Collection  Time: 04/24/18  8:29 PM  Result Value Ref Range Status   Enterococcus species NOT DETECTED NOT DETECTED Final   Listeria monocytogenes NOT DETECTED NOT DETECTED Final   Staphylococcus species NOT DETECTED NOT DETECTED Final   Staphylococcus aureus NOT DETECTED NOT DETECTED Final   Streptococcus species NOT DETECTED NOT DETECTED Final   Streptococcus agalactiae NOT DETECTED NOT DETECTED Final   Streptococcus pneumoniae NOT DETECTED NOT DETECTED Final   Streptococcus pyogenes NOT DETECTED NOT DETECTED Final   Acinetobacter baumannii NOT DETECTED NOT DETECTED Final   Enterobacteriaceae species DETECTED (A) NOT DETECTED Final    Comment: Enterobacteriaceae represent a large family of gram-negative bacteria, not a single organism. CRITICAL RESULT CALLED TO, READ BACK BY AND VERIFIED WITH: JLEGGE,PHARMD @1512  04/25/18 BY LHOWARD    Enterobacter cloacae complex NOT DETECTED NOT DETECTED Final   Escherichia coli NOT DETECTED NOT DETECTED Final   Klebsiella oxytoca NOT DETECTED NOT DETECTED Final   Klebsiella pneumoniae DETECTED (A) NOT DETECTED Final    Comment: CRITICAL RESULT CALLED TO, READ BACK BY AND VERIFIED WITH: JLEGGE,PHARMD @1513  04/25/18 BY LHOWARD    Proteus species NOT DETECTED NOT DETECTED Final   Serratia marcescens NOT DETECTED NOT DETECTED Final   Carbapenem resistance NOT DETECTED NOT DETECTED Final   Haemophilus influenzae NOT DETECTED NOT DETECTED Final   Neisseria meningitidis NOT DETECTED NOT DETECTED Final   Pseudomonas aeruginosa NOT DETECTED NOT DETECTED Final   Candida albicans NOT DETECTED NOT DETECTED Final   Candida glabrata NOT DETECTED NOT DETECTED Final   Candida krusei NOT DETECTED NOT DETECTED Final   Candida parapsilosis NOT DETECTED NOT DETECTED Final   Candida tropicalis NOT DETECTED NOT DETECTED Final    Comment: Performed at [facility], [place] [address], [place], [place] [address]  Urine culture     Status: Abnormal   Collection Time: 04/24/18  11:28 PM  Result Value Ref Range Status   Specimen Description   Final    URINE, CLEAN CATCH Performed at [facility], [place] [address], [place], [place] [address]    Special Requests   Final    NONE Performed at [facility], [place] [address] [place], [place] [address]    Culture >=100,000 COLONIES/mL  KLEBSIELLA PNEUMONIAE (A)  Final   Report Status 04/27/2018 FINAL  Final   Organism ID, Bacteria KLEBSIELLA PNEUMONIAE (A)  Final      Susceptibility   Klebsiella pneumoniae - MIC*    AMPICILLIN >=32 RESISTANT Resistant     CEFAZOLIN <=4 SENSITIVE Sensitive     CEFTRIAXONE <=1 SENSITIVE Sensitive     CIPROFLOXACIN <=0.25 SENSITIVE Sensitive     GENTAMICIN <=1 SENSITIVE Sensitive     IMIPENEM <=0.25 SENSITIVE Sensitive     NITROFURANTOIN 64 INTERMEDIATE Intermediate     TRIMETH/SULFA <=20 SENSITIVE Sensitive     AMPICILLIN/SULBACTAM >=32 RESISTANT Resistant     PIP/TAZO 32 INTERMEDIATE Intermediate     Extended ESBL NEGATIVE Sensitive     * >=100,000 COLONIES/mL KLEBSIELLA PNEUMONIAE     Medications:   . aspirin  325 mg Oral Daily  . atorvastatin  40 mg Oral q1800  . escitalopram  20 mg Oral Daily  . fluticasone furoate-vilanterol  1 puff Inhalation Daily  . insulin aspart  0-9 Units Subcutaneous TID WC  . lisinopril  10 mg Oral Daily  . pantoprazole  40 mg Oral Daily  . polyethylene glycol  17 g Oral Daily  . protein supplement shake  11 oz Oral BID BM  . sodium chloride flush  3 mL Intravenous Q12H  . tamsulosin  0.4 mg Oral QPC supper   Continuous Infusions: . sodium chloride    . sodium chloride 100 mL/hr at 04/26/18 2156  . ceFEPime (MAXIPIME) IV Stopped (04/27/18 0224)  . levETIRAcetam Stopped (04/26/18 2205)     LOS: 3 days   [facility] Pager [phone]  *Please refer to [email or web address], password [email or web address] to get updated schedule on who will round on this patient, as hospitalists switch teams weekly. If  7PM-7AM, please contact night-coverage at [email or web address], password [email or web address] for any overnight needs.  04/27/2018, 8:48 AM

## 2018-04-28 DIAGNOSIS — E119 Type 2 diabetes mellitus without complications: Secondary | ICD-10-CM

## 2018-04-28 DIAGNOSIS — N401 Enlarged prostate with lower urinary tract symptoms: Secondary | ICD-10-CM

## 2018-04-28 DIAGNOSIS — N390 Urinary tract infection, site not specified: Secondary | ICD-10-CM

## 2018-04-28 DIAGNOSIS — G9340 Encephalopathy, unspecified: Secondary | ICD-10-CM

## 2018-04-28 DIAGNOSIS — R338 Other retention of urine: Secondary | ICD-10-CM

## 2018-04-28 DIAGNOSIS — I10 Essential (primary) hypertension: Secondary | ICD-10-CM

## 2018-04-28 LAB — BASIC METABOLIC PANEL
ANION GAP: 8 (ref 5–15)
BUN: 14 mg/dL (ref 6–20)
CALCIUM: 8.6 mg/dL — AB (ref 8.9–10.3)
CO2: 23 mmol/L (ref 22–32)
Chloride: 108 mmol/L (ref 101–111)
Creatinine, Ser: 0.69 mg/dL (ref 0.61–1.24)
Glucose, Bld: 116 mg/dL — ABNORMAL HIGH (ref 65–99)
POTASSIUM: 3.9 mmol/L (ref 3.5–5.1)
Sodium: 139 mmol/L (ref 135–145)

## 2018-04-28 LAB — GLUCOSE, CAPILLARY
GLUCOSE-CAPILLARY: 132 mg/dL — AB (ref 65–99)
Glucose-Capillary: 103 mg/dL — ABNORMAL HIGH (ref 65–99)
Glucose-Capillary: 122 mg/dL — ABNORMAL HIGH (ref 65–99)
Glucose-Capillary: 95 mg/dL (ref 65–99)

## 2018-04-28 MED ORDER — CALCIUM CARBONATE ANTACID 500 MG PO CHEW
1.0000 | CHEWABLE_TABLET | Freq: Once | ORAL | Status: AC
  Administered 2018-04-28: 200 mg via ORAL
  Filled 2018-04-28: qty 1

## 2018-04-28 MED ORDER — SULFAMETHOXAZOLE-TRIMETHOPRIM 800-160 MG PO TABS
1.0000 | ORAL_TABLET | Freq: Two times a day (BID) | ORAL | Status: DC
  Administered 2018-04-28 – 2018-04-29 (×3): 1 via ORAL
  Filled 2018-04-28 (×3): qty 1

## 2018-04-28 NOTE — Progress Notes (Signed)
PROGRESS NOTE  Wesley [name] GBT:[id number] DOB: 11/01/1956 DOA: 04/24/2018 PCP: Boykin Nearing, MD  HPI/Recap of past 24 hours: Wesley [name] is an 62 y.o. male *past medical history significant of COPD mild dementia previous strokes was brought in by his wife for confusion and fevers.  In the ED her temperature was 102, he was found to have a possible UTI.  04/28/18 patient seen and examined at his bedside.:  He has no new complaints.  Blood culture positive for Klebsiella pneumonia.  Blood culture x2 repeated today.    Assessment/Plan: Principal Problem:   Acute lower UTI Active Problems:   Essential hypertension   COPD mixed type (HCC)   BPH (benign prostatic hyperplasia)   Memory loss   History of stroke   Diabetes mellitus type 2, controlled ([place])   Acute encephalopathy   UTI (urinary tract infection)   Klebsiella UTI Previously on Rocephin Urine culture positive for Klebsiella pneumonia with sensitivities to Bactrim Continue Bactrim  Klebsiella bacteremia Blood cultures positive x2 Continue Bactrim Blood cultures repeated today  BPH Continue Flomax Monitor urine output  History of seizures Continue Keppra  Chronic depression/anxiety Continue Lexapro  Hyperlipidemia continue Lipitor  Type 2 diabetes Last A1c Continue insulin sliding scale  Hypertension Blood pressures well controlled Continue home medications  History of sick sinus syndrome status post AICD  Essential tremor  Appears stable   Code Status: Full code  Family Communication: None at bedside  Disposition Plan: Home when clinically stable   Consultants:  None  Procedures:  None  Antimicrobials:  Bactrim  DVT prophylaxis: Subcu Lovenox   Objective: Vitals:   04/28/18 0827 04/28/18 0844 04/28/18 1025 04/28/18 1342  BP:   126/82 (!) 131/91  Pulse:   60 62  Resp:    16  Temp:    98 F (36.7 C)  TempSrc:    Oral  SpO2: 94% 94% 99% 98%  Weight:        Height:        Intake/Output Summary (Last 24 hours) at 04/28/2018 1742 Last data filed at 04/28/2018 1700 Gross per 24 hour  Intake 2586.67 ml  Output 3725 ml  Net -1138.33 ml   Filed Weights   04/25/18 0618  Weight: 92.1 kg (203 lb 0.7 oz)    Exam:  . General: 62 y.o. year-old male well developed well nourished in no acute distress.  Alert and oriented x3. . Cardiovascular: Regular rate and rhythm with no rubs or gallops.  No thyromegaly or JVD noted.   Marland Kitchen Respiratory: Clear to auscultation with no wheezes or rales. Good inspiratory effort. . Abdomen: Soft nontender nondistended with normal bowel sounds x4 quadrants. . Musculoskeletal: No lower extremity edema. 2/4 pulses in all 4 extremities. . Skin: No ulcerative lesions noted or rashes, . Psychiatry: Mood is appropriate for condition and setting   Data Reviewed: CBC: Recent Labs  Lab 04/24/18 2109 04/27/18 0808  WBC 7.0 4.7  NEUTROABS 5.9 2.3  HGB 15.2 12.3*  HCT 46.7 38.0*  MCV 85.5 84.8  PLT 109* 710*   Basic Metabolic Panel: Recent Labs  Lab 04/24/18 2109 04/28/18 0322  NA 137 139  K 3.5 3.9  CL 102 108  CO2 24 23  GLUCOSE 113* 116*  BUN 15 14  CREATININE 0.83 0.69  CALCIUM 9.1 8.6*   GFR: Estimated Creatinine Clearance: 112.7 mL/min (by C-G formula based on SCr of 0.69 mg/dL). Liver Function Tests: Recent Labs  Lab 04/24/18 2109  AST 22  ALT 33  ALKPHOS 94  BILITOT 1.1  PROT 7.7  ALBUMIN 4.0   No results for input(s): LIPASE, AMYLASE in the last 168 hours. No results for input(s): AMMONIA in the last 168 hours. Coagulation Profile: No results for input(s): INR, PROTIME in the last 168 hours. Cardiac Enzymes: No results for input(s): CKTOTAL, CKMB, CKMBINDEX, TROPONINI in the last 168 hours. BNP (last 3 results) No results for input(s): PROBNP in the last 8760 hours. HbA1C: No results for input(s): HGBA1C in the last 72 hours. CBG: Recent Labs  Lab 04/27/18 1213 04/27/18 1704  04/27/18 2025 04/28/18 0728 04/28/18 1210  GLUCAP 119* 114* 160* 103* 132*   Lipid Profile: No results for input(s): CHOL, HDL, LDLCALC, TRIG, CHOLHDL, LDLDIRECT in the last 72 hours. Thyroid Function Tests: No results for input(s): TSH, T4TOTAL, FREET4, T3FREE, THYROIDAB in the last 72 hours. Anemia Panel: No results for input(s): VITAMINB12, FOLATE, FERRITIN, TIBC, IRON, RETICCTPCT in the last 72 hours. Urine analysis:    Component Value Date/Time   COLORURINE YELLOW 04/24/2018 2024   APPEARANCEUR HAZY (A) 04/24/2018 2024   LABSPEC 1.017 04/24/2018 2024   PHURINE 7.0 04/24/2018 2024   GLUCOSEU NEGATIVE 04/24/2018 2024   HGBUR SMALL (A) 04/24/2018 2024   BILIRUBINUR NEGATIVE 04/24/2018 2024   BILIRUBINUR Negative 07/22/2016 [place] 04/24/2018 2024   PROTEINUR NEGATIVE 04/24/2018 2024   UROBILINOGEN 0.2 07/22/2016 1142   NITRITE NEGATIVE 04/24/2018 2024   LEUKOCYTESUR SMALL (A) 04/24/2018 2024   Sepsis Labs: @LABRCNTIP (procalcitonin:4,lacticidven:4)  ) Recent Results (from the past 240 hour(s))  Culture, blood (Routine x 2)     Status: None (Preliminary result)   Collection Time: 04/24/18  8:24 PM  Result Value Ref Range Status   Specimen Description   Final    BLOOD RIGHT HAND Performed at [facility], [place] [address], [place], [place] [address]    Special Requests   Final    BOTTLES DRAWN AEROBIC AND ANAEROBIC Blood Culture results may not be optimal due to an inadequate volume of blood received in culture bottles Performed at [place] [address], [place], [place] [address]    Culture   Final    NO GROWTH 3 DAYS Performed at [facility], [place] [address], [place], [place] [address]    Report Status PENDING  Incomplete  Urine culture     Status: Abnormal   Collection Time: 04/24/18  8:27 PM  Result Value Ref Range Status   Specimen Description   Final    URINE, CATHETERIZED Performed at  [place] [address] [place], [place] [address]    Special Requests   Final    NONE Performed at [facility], [place] [address] [place], [place] [address]    Culture >=100,000 COLONIES/mL KLEBSIELLA PNEUMONIAE (A)  Final   Report Status 04/27/2018 FINAL  Final   Organism ID, Bacteria KLEBSIELLA PNEUMONIAE (A)  Final      Susceptibility   Klebsiella pneumoniae - MIC*    AMPICILLIN >=32 RESISTANT Resistant     CEFAZOLIN <=4 SENSITIVE Sensitive     CEFTRIAXONE <=1 SENSITIVE Sensitive     CIPROFLOXACIN <=0.25 SENSITIVE Sensitive     GENTAMICIN <=1 SENSITIVE Sensitive     IMIPENEM <=0.25 SENSITIVE Sensitive     NITROFURANTOIN 128 RESISTANT Resistant     TRIMETH/SULFA <=20 SENSITIVE Sensitive     AMPICILLIN/SULBACTAM >=32 RESISTANT Resistant     PIP/TAZO 64 INTERMEDIATE Intermediate  Extended ESBL NEGATIVE Sensitive     * >=100,000 COLONIES/mL KLEBSIELLA PNEUMONIAE  Culture, blood (Routine x 2)     Status: Abnormal   Collection Time: 04/24/18  8:29 PM  Result Value Ref Range Status   Specimen Description   Final    BLOOD LEFT FOREARM Performed at [place] [address] [place], [place] [address]    Special Requests   Final    BOTTLES DRAWN AEROBIC AND ANAEROBIC Blood Culture results may not be optimal due to an excessive volume of blood received in culture bottles Performed at [place] [address], [place], [place] [address]    Culture  Setup Time   Final    GRAM NEGATIVE RODS IN BOTH AEROBIC AND ANAEROBIC BOTTLES CRITICAL RESULT CALLED TO, READ BACK BY AND VERIFIED WITH: JLEGGE,PHARMD @1515  04/25/18 BY LHOWARD Performed at [facility], [place] [address], [place], [place] [address]    Culture KLEBSIELLA PNEUMONIAE (A)  Final   Report Status 04/27/2018 FINAL  Final   Organism ID, Bacteria KLEBSIELLA PNEUMONIAE  Final      Susceptibility   Klebsiella pneumoniae - MIC*     AMPICILLIN >=32 RESISTANT Resistant     CEFAZOLIN <=4 SENSITIVE Sensitive     CEFEPIME <=1 SENSITIVE Sensitive     CEFTAZIDIME <=1 SENSITIVE Sensitive     CEFTRIAXONE <=1 SENSITIVE Sensitive     CIPROFLOXACIN <=0.25 SENSITIVE Sensitive     GENTAMICIN <=1 SENSITIVE Sensitive     IMIPENEM <=0.25 SENSITIVE Sensitive     TRIMETH/SULFA <=20 SENSITIVE Sensitive     AMPICILLIN/SULBACTAM >=32 RESISTANT Resistant     PIP/TAZO 16 SENSITIVE Sensitive     Extended ESBL NEGATIVE Sensitive     * KLEBSIELLA PNEUMONIAE  Blood Culture ID Panel (Reflexed)     Status: Abnormal   Collection Time: 04/24/18  8:29 PM  Result Value Ref Range Status   Enterococcus species NOT DETECTED NOT DETECTED Final   Listeria monocytogenes NOT DETECTED NOT DETECTED Final   Staphylococcus species NOT DETECTED NOT DETECTED Final   Staphylococcus aureus NOT DETECTED NOT DETECTED Final   Streptococcus species NOT DETECTED NOT DETECTED Final   Streptococcus agalactiae NOT DETECTED NOT DETECTED Final   Streptococcus pneumoniae NOT DETECTED NOT DETECTED Final   Streptococcus pyogenes NOT DETECTED NOT DETECTED Final   Acinetobacter baumannii NOT DETECTED NOT DETECTED Final   Enterobacteriaceae species DETECTED (A) NOT DETECTED Final    Comment: Enterobacteriaceae represent a large family of gram-negative bacteria, not a single organism. CRITICAL RESULT CALLED TO, READ BACK BY AND VERIFIED WITH: JLEGGE,PHARMD @1512  04/25/18 BY LHOWARD    Enterobacter cloacae complex NOT DETECTED NOT DETECTED Final   Escherichia coli NOT DETECTED NOT DETECTED Final   Klebsiella oxytoca NOT DETECTED NOT DETECTED Final   Klebsiella pneumoniae DETECTED (A) NOT DETECTED Final    Comment: CRITICAL RESULT CALLED TO, READ BACK BY AND VERIFIED WITH: JLEGGE,PHARMD @1513  04/25/18 BY LHOWARD    Proteus species NOT DETECTED NOT DETECTED Final   Serratia marcescens NOT DETECTED NOT DETECTED Final   Carbapenem resistance NOT DETECTED NOT DETECTED Final     Haemophilus influenzae NOT DETECTED NOT DETECTED Final   Neisseria meningitidis NOT DETECTED NOT DETECTED Final   Pseudomonas aeruginosa NOT DETECTED NOT DETECTED Final   Candida albicans NOT DETECTED NOT DETECTED Final   Candida glabrata NOT DETECTED NOT DETECTED Final   Candida krusei NOT DETECTED NOT DETECTED Final   Candida parapsilosis NOT DETECTED NOT DETECTED Final  Candida tropicalis NOT DETECTED NOT DETECTED Final    Comment: Performed at [facility], [place] [address], [place], [place] [address]  Urine culture     Status: Abnormal   Collection Time: 04/24/18 11:28 PM  Result Value Ref Range Status   Specimen Description   Final    URINE, CLEAN CATCH Performed at [facility], [place] [address], [place], [place] [address]    Special Requests   Final    NONE Performed at [facility], [place] [address] [place], [place] [address]    Culture >=100,000 COLONIES/mL KLEBSIELLA PNEUMONIAE (A)  Final   Report Status 04/27/2018 FINAL  Final   Organism ID, Bacteria KLEBSIELLA PNEUMONIAE (A)  Final      Susceptibility   Klebsiella pneumoniae - MIC*    AMPICILLIN >=32 RESISTANT Resistant     CEFAZOLIN <=4 SENSITIVE Sensitive     CEFTRIAXONE <=1 SENSITIVE Sensitive     CIPROFLOXACIN <=0.25 SENSITIVE Sensitive     GENTAMICIN <=1 SENSITIVE Sensitive     IMIPENEM <=0.25 SENSITIVE Sensitive     NITROFURANTOIN 64 INTERMEDIATE Intermediate     TRIMETH/SULFA <=20 SENSITIVE Sensitive     AMPICILLIN/SULBACTAM >=32 RESISTANT Resistant     PIP/TAZO 32 INTERMEDIATE Intermediate     Extended ESBL NEGATIVE Sensitive     * >=100,000 COLONIES/mL KLEBSIELLA PNEUMONIAE      Studies: No results found.  Scheduled Meds: . aspirin  325 mg Oral Daily  . atorvastatin  40 mg Oral q1800  . escitalopram  20 mg Oral Daily  . fluticasone furoate-vilanterol  1 puff Inhalation Daily  . insulin aspart  0-9 Units Subcutaneous TID WC  . lisinopril  10 mg Oral Daily   . pantoprazole  40 mg Oral Daily  . polyethylene glycol  17 g Oral Daily  . protein supplement shake  11 oz Oral BID BM  . sodium chloride flush  3 mL Intravenous Q12H  . sulfamethoxazole-trimethoprim  1 tablet Oral Q12H  . tamsulosin  0.4 mg Oral QPC supper    Continuous Infusions: . sodium chloride    . sodium chloride 100 mL/hr at 04/28/18 1611  . levETIRAcetam Stopped (04/28/18 1040)     LOS: 4 days     Kayleen Memos, MD [facility] Pager [phone]  If 7PM-7AM, please contact night-coverage [email or web address] Password [facility] 04/28/2018, 5:42 PM

## 2018-04-28 NOTE — Plan of Care (Signed)
  Problem: Nutrition: Goal: Adequate nutrition will be maintained Outcome: Progressing   Problem: Pain Managment: Goal: General experience of comfort will improve Outcome: Progressing   Problem: Safety: Goal: Ability to remain free from injury will improve Outcome: Progressing   

## 2018-04-29 DIAGNOSIS — A419 Sepsis, unspecified organism: Secondary | ICD-10-CM

## 2018-04-29 DIAGNOSIS — J449 Chronic obstructive pulmonary disease, unspecified: Secondary | ICD-10-CM

## 2018-04-29 DIAGNOSIS — Z8673 Personal history of transient ischemic attack (TIA), and cerebral infarction without residual deficits: Secondary | ICD-10-CM

## 2018-04-29 DIAGNOSIS — R413 Other amnesia: Secondary | ICD-10-CM

## 2018-04-29 LAB — BASIC METABOLIC PANEL
ANION GAP: 11 (ref 5–15)
BUN: 15 mg/dL (ref 6–20)
CALCIUM: 8.8 mg/dL — AB (ref 8.9–10.3)
CO2: 23 mmol/L (ref 22–32)
CREATININE: 0.77 mg/dL (ref 0.61–1.24)
Chloride: 107 mmol/L (ref 101–111)
GFR calc Af Amer: >60 mL/min (ref >60)
GLUCOSE: 106 mg/dL — AB (ref 65–99)
Potassium: 3.7 mmol/L (ref 3.5–5.1)
Sodium: 141 mmol/L (ref 135–145)

## 2018-04-29 LAB — CBC
HCT: 38.7 % — ABNORMAL LOW (ref 39.0–52.0)
HEMOGLOBIN: 12.9 g/dL — AB (ref 13.0–17.0)
MCH: 27.7 pg (ref 26.0–34.0)
MCHC: 33.3 g/dL (ref 30.0–36.0)
MCV: 83.2 fL (ref 78.0–100.0)
PLATELETS: 178 10*3/uL (ref 150–400)
RBC: 4.65 MIL/uL (ref 4.22–5.81)
RDW: 14.7 % (ref 11.5–15.5)
WBC: 5.6 10*3/uL (ref 4.0–10.5)

## 2018-04-29 LAB — GLUCOSE, CAPILLARY
GLUCOSE-CAPILLARY: 102 mg/dL — AB (ref 65–99)
Glucose-Capillary: 182 mg/dL — ABNORMAL HIGH (ref 65–99)

## 2018-04-29 MED ORDER — PREMIER PROTEIN SHAKE: 11.0000 [oz_av] | Freq: Two times a day (BID) | ORAL | 0 refills | Status: DC

## 2018-04-29 MED ORDER — LEVETIRACETAM 500 MG PO TABS
1000.0000 mg | ORAL_TABLET | Freq: Two times a day (BID) | ORAL | Status: DC
  Administered 2018-04-29: 1000 mg via ORAL
  Filled 2018-04-29: qty 2

## 2018-04-29 MED ORDER — POLYETHYLENE GLYCOL 3350 17 G PO PACK: 17.0000 g | PACK | Freq: Every day | ORAL | 0 refills | Status: DC

## 2018-04-29 MED ORDER — GABAPENTIN 600 MG PO TABS
300.0000 mg | ORAL_TABLET | Freq: Two times a day (BID) | ORAL | Status: DC
  Filled 2018-04-29: qty 0.5

## 2018-04-29 MED ORDER — SULFAMETHOXAZOLE-TRIMETHOPRIM 800-160 MG PO TABS: 1.0000 | ORAL_TABLET | Freq: Two times a day (BID) | ORAL | 0 refills | Status: DC

## 2018-04-29 MED ORDER — GABAPENTIN 300 MG PO CAPS: 300.0000 mg | ORAL_CAPSULE | Freq: Two times a day (BID) | ORAL | 0 refills | Status: DC

## 2018-04-29 MED ORDER — GABAPENTIN 300 MG PO CAPS
300.0000 mg | ORAL_CAPSULE | Freq: Two times a day (BID) | ORAL | Status: DC
  Administered 2018-04-29: 300 mg via ORAL
  Filled 2018-04-29: qty 1

## 2018-04-29 MED ORDER — SULFAMETHOXAZOLE-TRIMETHOPRIM 800-160 MG PO TABS: 1.0000 | ORAL_TABLET | Freq: Two times a day (BID) | ORAL | 0 refills | Status: AC

## 2018-04-29 NOTE — Care Management Note (Signed)
Case Management Note  Patient Details  Name: Wesley [name] MRN: [id number] Date of Birth: [birth year]  Subjective/Objective:      62 yo admitted with UTI             Action/Plan: From home with spouse. Choice offered to pt and [facility] chosen. [facility] rep alerted of referral.  Expected Discharge Date:  04/29/18               Expected Discharge Plan:  [name]  In-House Referral:     Discharge planning Services  CM Consult  Post Acute Care Choice:  Home Health Choice offered to:  Patient  DME Arranged:    DME Agency:     HH Arranged:  RN, PT, OT [place] Agency:  [place]  Status of Service:  In process, will continue to follow  If discussed at Long Length of Stay Meetings, dates discussed:    Additional CommentsLynnell Catalan, RN 04/29/2018, 2:13 PM  [phone]

## 2018-04-29 NOTE — Evaluation (Signed)
Physical Therapy Evaluation Patient Details Name: Wesley [name] MRN: [id number] DOB: 22-Nov-1956 Today's Date: 04/29/2018   History of Present Illness  62 yo male admitted with acute lower UTI. Hx of Sz, COPD, DM, SSS-pacemaker, CVA,MI, OA  Clinical Impression  On eval, pt required Min assist for mobility. He walked ~100'x1, then another ~50 feet x 1 with intermittent use of hallway handrail. Pt is unsteady and he c/o dizziness while ambulating. 1 seated rest break taken due to fatigue. Pt presents with general weakness, decreased activity tolerance, and impaired gait and balance. Recommend f/u PT. Will follow and progress activity as tolerated.     Follow Up Recommendations Home health PT;Supervision/Assistance - 24 hour vs Outpatient PT(if HHPT not an option, then OP PT if pt is agreeable)    Equipment Recommendations  None recommended by PT    Recommendations for Other Services       Precautions / Restrictions Precautions Precautions: Fall Restrictions Weight Bearing Restrictions: No      Mobility  Bed Mobility Overal bed mobility: Modified Independent                Transfers Overall transfer level: Needs assistance   Transfers: Sit to/from Stand Sit to Stand: Supervision         General transfer comment: for safety  Ambulation/Gait Ambulation/Gait assistance: Min assist Ambulation Distance (Feet): 100 Feet(100'x1, 50'x1) Assistive device: None(intermittent use of hallway handrail) Gait Pattern/deviations: Step-through pattern;Drifts right/left     General Gait Details: slow, unsteady gait pattern. Noted intermittent scissoring when turning/changing direction. Assist to stabilize throughout distance. 1 seated rest break needed due to fatigue. Pt also c/o dizziness.   Stairs            Wheelchair Mobility    Modified Rankin (Stroke Patients Only)       Balance Overall balance assessment: Needs assistance;History of Falls            Standing balance-Leahy Scale: Fair                               Pertinent Vitals/Pain Pain Assessment: Faces Faces Pain Scale: No hurt    Home Living Family/patient expects to be discharged to:: Private residence Living Arrangements: Spouse/significant other Available Help at Discharge: Family Type of Home: House Home Access: Stairs to enter Entrance Stairs-Rails: Right Entrance Stairs-Number of Steps: 3 Home Layout: One level Home Equipment: Cane - single point;Shower seat;Walker - 4 wheels      Prior Function Level of Independence: Independent with assistive device(s)         Comments: uses rollator vs cane     Hand Dominance        Extremity/Trunk Assessment   Upper Extremity Assessment Upper Extremity Assessment: Defer to OT evaluation    Lower Extremity Assessment Lower Extremity Assessment: Generalized weakness    Cervical / Trunk Assessment Cervical / Trunk Assessment: Normal  Communication   Communication: No difficulties  Cognition Arousal/Alertness: Awake/alert Behavior During Therapy: WFL for tasks assessed/performed Overall Cognitive Status: Within Functional Limits for tasks assessed                                        General Comments      Exercises     Assessment/Plan    PT Assessment Patient needs continued PT services  PT Problem List Decreased  strength;Decreased balance;Decreased mobility;Decreased activity tolerance;Decreased safety awareness       PT Treatment Interventions Gait training;DME instruction;Functional mobility training;Balance training;Patient/family education;Therapeutic activities;Therapeutic exercise    PT Goals (Current goals can be found in the Care Plan section)  Acute Rehab PT Goals Patient Stated Goal: none sated PT Goal Formulation: With patient Time For Goal Achievement: 05/13/18 Potential to Achieve Goals: Good    Frequency Min 3X/week   Barriers to discharge         Co-evaluation               AM-PAC PT "6 Clicks" Daily Activity  Outcome Measure Difficulty turning over in bed (including adjusting bedclothes, sheets and blankets)?: None Difficulty moving from lying on back to sitting on the side of the bed? : None Difficulty sitting down on and standing up from a chair with arms (e.g., wheelchair, bedside commode, etc,.)?: None Help needed moving to and from a bed to chair (including a wheelchair)?: A Little Help needed walking in hospital room?: A Little Help needed climbing 3-5 steps with a railing? : A Little 6 Click Score: 21    End of Session Equipment Utilized During Treatment: Gait belt Activity Tolerance: Patient tolerated treatment well Patient left: in bed;with call bell/phone within reach;with bed alarm set   PT Visit Diagnosis: Difficulty in walking, not elsewhere classified (R26.2);Muscle weakness (generalized) (M62.81);Unsteadiness on feet (R26.81);History of falling (Z91.81)    Time: 7412-8786 PT Time Calculation (min) (ACUTE ONLY): 11 min   Charges:   PT Evaluation $PT Eval Moderate Complexity: 1 Mod     PT G Codes:         Weston Anna, MPT Pager: [phone]

## 2018-04-29 NOTE — Discharge Instructions (Signed)
Urinary Tract Infection, Adult A urinary tract infection (UTI) is an infection of any part of the urinary tract. The urinary tract includes the:  Kidneys.  Ureters.  Bladder.  Urethra.  These organs make, store, and get rid of pee (urine) in the body. Follow these instructions at home:  Take over-the-counter and prescription medicines only as told by your doctor.  If you were prescribed an antibiotic medicine, take it as told by your doctor. Do not stop taking the antibiotic even if you start to feel better.  Avoid the following drinks: ? Alcohol. ? Caffeine. ? Tea. ? Carbonated drinks.  Drink enough fluid to keep your pee clear or pale yellow.  Keep all follow-up visits as told by your doctor. This is important.  Make sure to: ? Empty your bladder often and completely. Do not to hold pee for long periods of time. ? Empty your bladder before and after sex. ? Wipe from front to back after a bowel movement if you are male. Use each tissue one time when you wipe. Contact a doctor if:  You have back pain.  You have a fever.  You feel sick to your stomach (nauseous).  You throw up (vomit).  Your symptoms do not get better after 3 days.  Your symptoms go away and then come back. Get help right away if:  You have very bad back pain.  You have very bad lower belly (abdominal) pain.  You are throwing up and cannot keep down any medicines or water. This information is not intended to replace advice given to you by your health care provider. Make sure you discuss any questions you have with your health care provider. Document Released: 05/19/2008 Document Revised: 05/08/2016 Document Reviewed: 10/22/2015 Elsevier Interactive Patient Education  2018 [employer].   Bacteremia Bacteremia is the presence of bacteria in the blood. When bacteria enter the bloodstream, they can cause a life-threatening reaction called sepsis, which is a medical emergency. Bacteremia can  spread to other parts of the body, including the heart, joints, and brain. What are the causes? This condition is caused by bacteria that get into the blood. Bacteria can enter the blood:  From a skin infection or a cut on your skin.  During an episode of pneumonia.  From an infection in your stomach or intestine (gastrointestinal infection).  From an infection in your bladder or urinary system (urinary tract infection).  During a dental or medical procedure.  After you brush your teeth so hard that your gums bleed.  When a bacterial infection in another part of the body spreads to the blood.  Through a dirty needle.  What increases the risk? This condition is more likely to develop in:  Children.  Elderly adults.  People who have a long-lasting (chronic) disease or medical condition.  People who have an artificial joint or heart valve.  People who have heart valve disease.  People who have a tube, such as a catheter or IV tube, that has been inserted for a medical treatment.  People who have a weak body defense system (immune system).  People who use IV drugs.  What are the signs or symptoms? Symptoms of this condition include:  Fever.  Chills.  A racing heart.  Shortness of breath.  Dizziness.  Weakness.  Confusion.  Nausea or vomiting.  Diarrhea.  In some cases, there are no symptoms. Bacteremia that has spread to the other parts of the body may cause symptoms in those areas. How is  this diagnosed? This condition may be diagnosed with a physical exam and tests, such as:  A complete blood count (CBC). This test looks for signs of infection.  Blood cultures. These look for bacteria in your blood.  Tests of any tubes that you may have inserted into your body, such as an IV tube or urinary catheter. These tests look for a source of infection.  Urine tests including urine cultures. These look for bacteria in the urine that could be a source of  infection.  Imaging tests, such as an X-ray, CT scan, MRI, or heart ultrasound. These look for a source of infection in other parts of the body, such as the lungs, heart valves, or joints.  How is this treated? This condition may be treated with:  Antibiotic medicines given through an IV infusion. Depending on the source of infection, antibiotics may be needed for several weeks. At first, an antibiotic may be given to kill most types of blood bacteria. If your test results show that a certain kind of bacteria is causing problems, the antibiotic may be changed to kill only the bacteria that are causing problems.  Antibiotics taken by mouth.  IV fluids to support the body as you fight the infection.  Removing any catheter or device that could be a source of infection.  Blood pressure and breathing support, if you have sepsis.  Surgery to control the source or spread of infection, such as: ? Removing an infected implanted device. ? Removing infected tissue or an abscess.  This condition is usually treated at a hospital. If you are treated at home, you may need to come back for medicines, blood tests, and evaluation. This is important. Follow these instructions at home:  Take over-the-counter and prescription medicines only as told by your health care provider.  If you were prescribed an antibiotic, take it as told by your health care provider. Do not stop taking the antibiotic even if you start to feel better.  Rest until your condition is under control.  Drink enough fluid to keep your urine clear or pale yellow.  Do not smoke. If you need help quitting, ask your health care provider.  Keep all follow-up visits as told by your health care provider. This is important. How is this prevented?  Get the vaccinations that your health care provider recommends.  Clean and cover any scrapes or cuts.  Take good care of your skin. This includes regular bathing and moisturizing.  Wash  your hands often.  Practice good oral hygiene. Brush your teeth two times a day and floss regularly. Get help right away if:  You have pain.  You have a fever.  You have trouble breathing.  Your skin becomes blotchy, pale, or clammy.  You develop confusion, dizziness, or weakness.  You develop diarrhea.  You develop any new symptoms after treatment. Summary  Bacteremia is the presence of bacteria in the blood. When bacteria enter the bloodstream, they can cause a life- threatening reaction called sepsis.  Children and elderly adults are at increased risk of bacteremia. Other risk factors include having a long-lasting (chronic) disease or a weak immune system, having an artificial joint or heart valve, having heart valve disease, having tubes that were inserted in the body for medical treatment, or using IV drugs.  Some symptoms of bacteremia include fever, chills, shortness of breath, confusion, nausea or vomiting, and diarrhea.  Tests may be done to diagnose a source of infection that led to bacteremia. These tests  may include blood tests, urine tests, and imaging tests.  Bacteremia is usually treated with antibiotics, usually in a hospital. This information is not intended to replace advice given to you by your health care provider. Make sure you discuss any questions you have with your health care provider. Document Released: 09/14/2006 Document Revised: 10/28/2016 Document Reviewed: 10/28/2016 Elsevier Interactive Patient Education  [employer].

## 2018-04-29 NOTE — Discharge Summary (Addendum)
Discharge Summary  Wesley [name] EQA:[id number] DOB: [birth year]  PCP: Boykin Nearing, MD  Admit date: 04/24/2018 Discharge date: 04/29/2018  Time spent: 25 minutes  Recommendations for Outpatient Follow-up:  1. Follow up with your PCP 2. Follow up with neurology 3. Repeat BMP on Monday 05/03/18  Discharge Diagnoses:  Active Hospital Problems   Diagnosis Date Noted  . Acute lower UTI 02/20/2018  . UTI (urinary tract infection) 04/24/2018  . Acute encephalopathy 05/20/2017  . Diabetes mellitus type 2, controlled ([facility]) 08/31/2016  . History of stroke   . BPH (benign prostatic hyperplasia) 11/29/2015  . Memory loss 11/29/2015  . COPD mixed type ([facility]) 10/04/2015  . Essential hypertension 10/04/2015    Resolved Hospital Problems  No resolved problems to display.    Discharge Condition: Stable  Diet recommendation: Resume previous diet  Vitals:   04/28/18 1949 04/29/18 0528  BP: (!) 157/92 137/88  Pulse: 61 64  Resp: 17 17  Temp: 98.1 F (36.7 C) 98 F (36.7 C)  SpO2: 98% 97%    History of present illness:  Wesley Corbit Whartonis an 62 y.o.male*past medical history significant of COPD mild dementia previous strokes was brought in by his wife for confusion and fevers. In the ED her temperature was 102, he was found to have a possible UTI.  04/28/18 patient seen and examined at his bedside. He has no new complaints.  Blood culture positive for Klebsiella pneumonia.  Blood culture x2 repeated today.  04/29/18: No new complaints.   On the day of discharge, the patient was hemodynamically stable.   Hospital Course:  Principal Problem:   Acute lower UTI Active Problems:   Essential hypertension   COPD mixed type (HCC)   BPH (benign prostatic hyperplasia)   Memory loss   History of stroke   Diabetes mellitus type 2, controlled ([place])   Acute encephalopathy   UTI (urinary tract infection)  Sepsis 2/2 Klebsiella UTI and klebsiella bacteremia, poa Previously  on Rocephin Urine culture positive for Klebsiella pneumonia with sensitivities to Bactrim Continue Bactrim  Klebsiella bacteremia Blood cultures positive x2 Continue Bactrim Blood cultures repeated today  BPH Continue Flomax Monitor urine output  History of seizures Continue Keppra  Chronic depression/anxiety Continue Lexapro  Hyperlipidemia continue Lipitor  Type 2 diabetes Last A1c 9.8 Continue insulin sliding scale  Hypertension Blood pressures well controlled Continue home medications  History of sick sinus syndrome status post AICD  Essential tremor  Appears stable    Procedures:  None  Consultations:  None  Discharge Exam: BP 137/88 (BP Location: Right Arm)   Pulse 64   Temp 98 F (36.7 C) (Oral)   Resp 17   Ht '6\' 2"'  (1.88 m)   Wt 92.1 kg (203 lb 0.7 oz)   SpO2 97%   BMI 26.07 kg/m  . General: 62 y.o. year-old male well developed well nourished in no acute distress.  Alert and oriented x3. . Cardiovascular: Regular rate and rhythm with no rubs or gallops.  No thyromegaly or JVD noted.   Marland Kitchen Respiratory: Clear to auscultation with no wheezes or rales. Good inspiratory effort. . Abdomen: Soft nontender nondistended with normal bowel sounds x4 quadrants. . Musculoskeletal: No lower extremity edema. 2/4 pulses in all 4 extremities. . Skin: No ulcerative lesions noted or rashes, . Psychiatry: Mood is appropriate for condition and setting  Discharge Instructions You were cared for by a hospitalist during your hospital stay. If you have any questions about your discharge medications or the care you  received while you were in the hospital after you are discharged, you can call the unit and asked to speak with the hospitalist on call if the hospitalist that took care of you is not available. Once you are discharged, your primary care physician will handle any further medical issues. Please note that NO REFILLS for any discharge medications will be  authorized once you are discharged, as it is imperative that you return to your primary care physician (or establish a relationship with a primary care physician if you do not have one) for your aftercare needs so that they can reassess your need for medications and monitor your lab values.   Allergies as of 04/29/2018      Reactions   Penicillins Swelling   Has patient had a PCN reaction causing immediate rash, facial/tongue/throat swelling, SOB or lightheadedness with hypotension: yes Has patient had a PCN reaction causing severe rash involving mucus membranes or skin necrosis: unknown Has patient had a PCN reaction that required hospitalization : yes Has patient had a PCN reaction occurring within the last 10 years: no If all of the above answers are "NO", then may proceed with Cephalosporin use.   Levaquin [levofloxacin In D5w] Hives      Medication List    TAKE these medications   ACCU-CHEK AVIVA PLUS w/Device Kit 1 Device by Does not apply route 4 (four) times daily.   albuterol 108 (90 Base) MCG/ACT inhaler Commonly known as:  PROVENTIL HFA;VENTOLIN HFA Inhale 2 puffs into the lungs every 6 (six) hours as needed for wheezing or shortness of breath.   aspirin 325 MG tablet Take 325 mg by mouth daily. What changed:  Another medication with the same name was removed. Continue taking this medication, and follow the directions you see here.   atorvastatin 40 MG tablet Commonly known as:  LIPITOR Take 1 tablet (40 mg total) by mouth daily at 6 PM.   Brimonidine-Dorzolamide 0.15-2 % Soln Place 1 drop into both eyes 2 (two) times daily.   carboxymethylcellulose 0.5 % Soln Commonly known as:  REFRESH PLUS Place 1 drop into both eyes at bedtime as needed.   cetirizine 10 MG tablet Commonly known as:  ZYRTEC Take 1 tablet (10 mg total) by mouth daily.   diclofenac sodium 1 % Gel Commonly known as:  VOLTAREN Apply 4 g topically 4 (four) times daily.   escitalopram 20 MG  tablet Commonly known as:  LEXAPRO Take 1 tablet (20 mg total) by mouth daily.   finasteride 5 MG tablet Commonly known as:  PROSCAR Take 5 mg by mouth daily.   fluticasone 50 MCG/ACT nasal spray Commonly known as:  FLONASE Place 2 sprays into both nostrils daily.   fluticasone furoate-vilanterol 100-25 MCG/INH Aepb Commonly known as:  BREO ELLIPTA Inhale 1 puff into the lungs daily.   gabapentin 300 MG capsule Commonly known as:  NEURONTIN Take 1 capsule (300 mg total) by mouth 2 (two) times daily.   glucose blood test strip Commonly known as:  ACCU-CHEK AVIVA Use as instructed   HUGO ROLLING WALKER BASIC Misc 1 each by Does not apply route daily as needed.   [employer] Misc 1 each by Does not apply route as needed.   insulin glargine 100 UNIT/ML injection Commonly known as:  LANTUS Inject 20 Units into the skin at bedtime.   latanoprost 0.005 % ophthalmic solution Commonly known as:  XALATAN Place 1 drop into both eyes at bedtime.   levETIRAcetam 500 MG tablet Commonly  known as:  KEPPRA Take 2 tablets (1,000 mg total) by mouth 2 (two) times daily. What changed:  how much to take   lisinopril 10 MG tablet Commonly known as:  PRINIVIL,ZESTRIL Take 1 tablet (10 mg total) by mouth daily.   meloxicam 15 MG tablet Commonly known as:  MOBIC Take 15 mg by mouth daily as needed for pain.   metFORMIN 1000 MG tablet Commonly known as:  GLUCOPHAGE Take 1 tablet (1,000 mg total) by mouth 2 (two) times daily with a meal.   PADSORBER BED PAN LINERS Misc 1 each by Does not apply route daily.   pantoprazole 40 MG tablet Commonly known as:  PROTONIX Take 1 tablet (40 mg total) by mouth daily.   polyethylene glycol packet Commonly known as:  MIRALAX / GLYCOLAX Take 17 g by mouth daily.   protein supplement shake Liqd Commonly known as:  PREMIER PROTEIN Take 325 mLs (11 oz total) by mouth 2 (two) times daily between meals.   SPIRIVA RESPIMAT 1.25 MCG/ACT  Aers Generic drug:  Tiotropium Bromide Monohydrate Inhale 2 puffs into the lungs daily.   sulfamethoxazole-trimethoprim 800-160 MG tablet Commonly known as:  BACTRIM DS,SEPTRA DS Take 1 tablet by mouth 2 (two) times daily for 10 days.   tamsulosin 0.4 MG Caps capsule Commonly known as:  FLOMAX Take 1 capsule (0.4 mg total) by mouth daily after supper.   venlafaxine XR 150 MG 24 hr capsule Commonly known as:  EFFEXOR-XR Take 150 mg by mouth daily with breakfast.   vitamin B-12 500 MCG tablet Commonly known as:  CYANOCOBALAMIN Take 500 mcg by mouth daily.   Vitamin D3 2000 units Tabs Take 2,000 Units by mouth daily.   Wrist Splint/Elastic Left Lg Misc 1 each by Does not apply route daily.      Allergies  Allergen Reactions  . Penicillins Swelling    Has patient had a PCN reaction causing immediate rash, facial/tongue/throat swelling, SOB or lightheadedness with hypotension: yes Has patient had a PCN reaction causing severe rash involving mucus membranes or skin necrosis: unknown Has patient had a PCN reaction that required hospitalization : yes Has patient had a PCN reaction occurring within the last 10 years: no If all of the above answers are "NO", then may proceed with Cephalosporin use.   [name] [Levofloxacin In D5w] Hives   Follow-up Information    Funches, Josalyn, MD Follow up in 2 day(s).   Specialty:  Family Medicine Why:  call with appointment Contact information: [place] [address] [phone]            The results of significant diagnostics from this hospitalization (including imaging, microbiology, ancillary and laboratory) are listed below for reference.    Significant Diagnostic Studies: Ct Head Wo Contrast  Result Date: 04/24/2018 CLINICAL DATA:  62 year old male with altered mental status for 1 day. EXAM: CT HEAD WITHOUT CONTRAST TECHNIQUE: Contiguous axial images were obtained from the base of the skull through the  vertex without intravenous contrast. COMPARISON:  02/20/2018 and prior CTs FINDINGS: Brain: No evidence of acute infarction, hemorrhage, hydrocephalus, extra-axial collection or mass lesion/mass effect. Atrophy and chronic small-vessel white matter ischemic changes are again noted. Vascular: Atherosclerotic calcifications noted. Skull: Normal. Negative for fracture or focal lesion. Sinuses/Orbits: No acute finding. Other: None. IMPRESSION: 1. No evidence of acute intracranial abnormality 2. Atrophy and chronic small-vessel white matter ischemic changes. Electronically Signed   By: Margarette Canada M.D.   On: 04/24/2018 21:45  US Renal  Result Date: 04/26/2018 CLINICAL DATA:  Fever, hypertension. EXAM: RENAL / URINARY TRACT ULTRASOUND COMPLETE COMPARISON:  CT scan of the abdomen of February 20, 2018 FINDINGS: Right Kidney: Length: 12.8 cm. Echogenicity within normal limits. No mass or hydronephrosis visualized. Left Kidney: Length: 12.3 cm. Echogenicity within normal limits. No mass or hydronephrosis visualized. Bladder: The partially distended urinary bladder is normal. IMPRESSION: There is no acute or significant chronic abnormality of the kidneys or urinary bladder. Electronically Signed   By: David  Martinique M.D.   On: 04/26/2018 11:19   Dg Chest Port 1 View  Result Date: 04/24/2018 CLINICAL DATA:  62 year old male with fever. EXAM: PORTABLE CHEST 1 VIEW COMPARISON:  Chest CT dated 02/20/2018 FINDINGS: There is emphysema with large right upper lobe bulla. Right mid lung field linear atelectatic changes. There is no focal consolidation, pleural effusion, or pneumothorax. The cardiac silhouette is within normal limits. Left pectoral pacemaker device. No acute osseous pathology. IMPRESSION: No active disease. Electronically Signed   By: Anner Crete M.D.   On: 04/24/2018 21:13    Microbiology: Recent Results (from the past 240 hour(s))  Culture, blood (Routine x 2)     Status: None (Preliminary result)    Collection Time: 04/24/18  8:24 PM  Result Value Ref Range Status   Specimen Description   Final    BLOOD RIGHT HAND Performed at [facility] [address] [place], [place] [address]    Special Requests   Final    BOTTLES DRAWN AEROBIC AND ANAEROBIC Blood Culture results may not be optimal due to an inadequate volume of blood received in culture bottles Performed at [facility] [address] [place], [place] [address]    Culture   Final    NO GROWTH 4 DAYS Performed at [facility], [place] [address], [place], [place] [address]    Report Status PENDING  Incomplete  Urine culture     Status: Abnormal   Collection Time: 04/24/18  8:27 PM  Result Value Ref Range Status   Specimen Description   Final    URINE, CATHETERIZED Performed at [facility] [address], [place], [place] [address]    Special Requests   Final    NONE Performed at [facility], [place] [address] [place], [place] [address]    Culture >=100,000 COLONIES/mL KLEBSIELLA PNEUMONIAE (A)  Final   Report Status 04/27/2018 FINAL  Final   Organism ID, Bacteria KLEBSIELLA PNEUMONIAE (A)  Final      Susceptibility   Klebsiella pneumoniae - MIC*    AMPICILLIN >=32 RESISTANT Resistant     CEFAZOLIN <=4 SENSITIVE Sensitive     CEFTRIAXONE <=1 SENSITIVE Sensitive     CIPROFLOXACIN <=0.25 SENSITIVE Sensitive     GENTAMICIN <=1 SENSITIVE Sensitive     IMIPENEM <=0.25 SENSITIVE Sensitive     NITROFURANTOIN 128 RESISTANT Resistant     TRIMETH/SULFA <=20 SENSITIVE Sensitive     AMPICILLIN/SULBACTAM >=32 RESISTANT Resistant     PIP/TAZO 64 INTERMEDIATE Intermediate     Extended ESBL NEGATIVE Sensitive     * >=100,000 COLONIES/mL KLEBSIELLA PNEUMONIAE  Culture, blood (Routine x 2)     Status: Abnormal   Collection Time: 04/24/18  8:29 PM  Result Value Ref Range Status   Specimen Description   Final    BLOOD LEFT FOREARM Performed at  [facility] [address] [place],  [address]    Special Requests  Final    BOTTLES DRAWN AEROBIC AND ANAEROBIC Blood Culture results may not be optimal due to an excessive volume of blood received in culture bottles Performed at [facility] [address], [place], [place] [address]    Culture  Setup Time   Final    GRAM NEGATIVE RODS IN BOTH AEROBIC AND ANAEROBIC BOTTLES CRITICAL RESULT CALLED TO, READ BACK BY AND VERIFIED WITH: JLEGGE,PHARMD '@1515'  04/25/18 BY LHOWARD Performed at [facility], [place] [address] [place], [place] [address]    Culture KLEBSIELLA PNEUMONIAE (A)  Final   Report Status 04/27/2018 FINAL  Final   Organism ID, Bacteria KLEBSIELLA PNEUMONIAE  Final      Susceptibility   Klebsiella pneumoniae - MIC*    AMPICILLIN >=32 RESISTANT Resistant     CEFAZOLIN <=4 SENSITIVE Sensitive     CEFEPIME <=1 SENSITIVE Sensitive     CEFTAZIDIME <=1 SENSITIVE Sensitive     CEFTRIAXONE <=1 SENSITIVE Sensitive     CIPROFLOXACIN <=0.25 SENSITIVE Sensitive     GENTAMICIN <=1 SENSITIVE Sensitive     IMIPENEM <=0.25 SENSITIVE Sensitive     TRIMETH/SULFA <=20 SENSITIVE Sensitive     AMPICILLIN/SULBACTAM >=32 RESISTANT Resistant     PIP/TAZO 16 SENSITIVE Sensitive     Extended ESBL NEGATIVE Sensitive     * KLEBSIELLA PNEUMONIAE  Blood Culture ID Panel (Reflexed)     Status: Abnormal   Collection Time: 04/24/18  8:29 PM  Result Value Ref Range Status   Enterococcus species NOT DETECTED NOT DETECTED Final   Listeria monocytogenes NOT DETECTED NOT DETECTED Final   Staphylococcus species NOT DETECTED NOT DETECTED Final   Staphylococcus aureus NOT DETECTED NOT DETECTED Final   Streptococcus species NOT DETECTED NOT DETECTED Final   Streptococcus agalactiae NOT DETECTED NOT DETECTED Final   Streptococcus pneumoniae NOT DETECTED NOT DETECTED Final   Streptococcus pyogenes NOT DETECTED NOT DETECTED Final   Acinetobacter baumannii  NOT DETECTED NOT DETECTED Final   Enterobacteriaceae species DETECTED (A) NOT DETECTED Final    Comment: Enterobacteriaceae represent a large family of gram-negative bacteria, not a single organism. CRITICAL RESULT CALLED TO, READ BACK BY AND VERIFIED WITH: JLEGGE,PHARMD '@1512'  04/25/18 BY LHOWARD    Enterobacter cloacae complex NOT DETECTED NOT DETECTED Final   Escherichia coli NOT DETECTED NOT DETECTED Final   Klebsiella oxytoca NOT DETECTED NOT DETECTED Final   Klebsiella pneumoniae DETECTED (A) NOT DETECTED Final    Comment: CRITICAL RESULT CALLED TO, READ BACK BY AND VERIFIED WITH: JLEGGE,PHARMD '@1513'  04/25/18 BY LHOWARD    Proteus species NOT DETECTED NOT DETECTED Final   Serratia marcescens NOT DETECTED NOT DETECTED Final   Carbapenem resistance NOT DETECTED NOT DETECTED Final   Haemophilus influenzae NOT DETECTED NOT DETECTED Final   Neisseria meningitidis NOT DETECTED NOT DETECTED Final   Pseudomonas aeruginosa NOT DETECTED NOT DETECTED Final   Candida albicans NOT DETECTED NOT DETECTED Final   Candida glabrata NOT DETECTED NOT DETECTED Final   Candida krusei NOT DETECTED NOT DETECTED Final   Candida parapsilosis NOT DETECTED NOT DETECTED Final   Candida tropicalis NOT DETECTED NOT DETECTED Final    Comment: Performed at [facility], [place] [address], [place], [place] [address]  Urine culture     Status: Abnormal   Collection Time: 04/24/18 11:28 PM  Result Value Ref Range Status   Specimen Description   Final    URINE, CLEAN CATCH Performed at [facility], [place] [address] [place], [place] [address]  Special Requests   Final    NONE Performed at [facility], [place] [address] [place], [place] [address]    Culture >=100,000 COLONIES/mL KLEBSIELLA PNEUMONIAE (A)  Final   Report Status 04/27/2018 FINAL  Final   Organism ID, Bacteria KLEBSIELLA PNEUMONIAE (A)  Final      Susceptibility   Klebsiella pneumoniae - MIC*     AMPICILLIN >=32 RESISTANT Resistant     CEFAZOLIN <=4 SENSITIVE Sensitive     CEFTRIAXONE <=1 SENSITIVE Sensitive     CIPROFLOXACIN <=0.25 SENSITIVE Sensitive     GENTAMICIN <=1 SENSITIVE Sensitive     IMIPENEM <=0.25 SENSITIVE Sensitive     NITROFURANTOIN 64 INTERMEDIATE Intermediate     TRIMETH/SULFA <=20 SENSITIVE Sensitive     AMPICILLIN/SULBACTAM >=32 RESISTANT Resistant     PIP/TAZO 32 INTERMEDIATE Intermediate     Extended ESBL NEGATIVE Sensitive     * >=100,000 COLONIES/mL KLEBSIELLA PNEUMONIAE  Culture, blood (routine x 2)     Status: None (Preliminary result)   Collection Time: 04/28/18 10:48 AM  Result Value Ref Range Status   Specimen Description   Final    BLOOD LEFT ANTECUBITAL Performed at [facility] [address] [place], [place] [address]    Special Requests   Final    BOTTLES DRAWN AEROBIC AND ANAEROBIC Blood Culture adequate volume Performed at [facility] [address] [place], [place] [address]    Culture   Final    NO GROWTH < 24 HOURS Performed at [facility] [address], [place], [place] [address]    Report Status PENDING  Incomplete  Culture, blood (routine x 2)     Status: None (Preliminary result)   Collection Time: 04/28/18 10:50 AM  Result Value Ref Range Status   Specimen Description   Final    BLOOD RIGHT HAND Performed at [facility] [address], [place], [place] [address]    Special Requests   Final    AEROBIC BOTTLE ONLY Blood Culture adequate volume Performed at [facility] [address] [place], [place] [address]    Culture   Final    NO GROWTH < 24 HOURS Performed at [facility] [address] [place], [place] [address]    Report Status PENDING  Incomplete     Labs: Basic Metabolic Panel: Recent Labs  Lab 04/24/18 2109 04/28/18 0322 04/29/18 0411  NA 137 139 141  K 3.5 3.9 3.7  CL 102 108 107  CO2 '24 23 23    ' GLUCOSE 113* 116* 106*  BUN '15 14 15  ' CREATININE 0.83 0.69 0.77  CALCIUM 9.1 8.6* 8.8*   Liver Function Tests: Recent Labs  Lab 04/24/18 2109  AST 22  ALT 33  ALKPHOS 94  BILITOT 1.1  PROT 7.7  ALBUMIN 4.0   No results for input(s): LIPASE, AMYLASE in the last 168 hours. No results for input(s): AMMONIA in the last 168 hours. CBC: Recent Labs  Lab 04/24/18 2109 04/27/18 0808 04/29/18 0411  WBC 7.0 4.7 5.6  NEUTROABS 5.9 2.3  --   HGB 15.2 12.3* 12.9*  HCT 46.7 38.0* 38.7*  MCV 85.5 84.8 83.2  PLT 109* 122* 178   Cardiac Enzymes: No results for input(s): CKTOTAL, CKMB, CKMBINDEX, TROPONINI in the last 168 hours. BNP: BNP (last 3 results) Recent Labs    02/03/18 1813  BNP 5.2    ProBNP (last 3 results) No results for input(s):  PROBNP in the last 8760 hours.  CBG: Recent Labs  Lab 04/28/18 1210 04/28/18 1747 04/28/18 2045 04/29/18 0738 04/29/18 1149  GLUCAP 132* 122* 95 102* 182*       Signed:  Kayleen Memos, MD [facility] 04/29/2018, 1:08 PM

## 2018-04-30 LAB — CULTURE, BLOOD (ROUTINE X 2): Culture: NO GROWTH

## 2018-05-03 LAB — CULTURE, BLOOD (ROUTINE X 2)
CULTURE: NO GROWTH
Culture: NO GROWTH
SPECIAL REQUESTS: ADEQUATE
Special Requests: ADEQUATE

## 2018-05-14 ENCOUNTER — Telehealth: Payer: Self-pay | Admitting: Family Medicine

## 2018-05-14 NOTE — Telephone Encounter (Signed)
Santiago Glad from [facility] called stating that she called the patient last night so that she could visit him today and patient stated that he was going to ask his wife and would give her a call back. Santiago Glad waited for an hour and called patient back, patient did not answer and she called him today and patient still did not answer. Santiago Glad did not see the patient today.

## 2018-06-11 ENCOUNTER — Encounter (HOSPITAL_COMMUNITY): Payer: Self-pay | Admitting: Emergency Medicine

## 2018-06-11 ENCOUNTER — Other Ambulatory Visit: Payer: Self-pay

## 2018-06-11 ENCOUNTER — Observation Stay (HOSPITAL_COMMUNITY)
Admission: EM | Admit: 2018-06-11 | Discharge: 2018-06-13 | Disposition: A | Discharge status: Home / Self Care | Payer: Medicaid Other | Attending: Internal Medicine | Admitting: Internal Medicine

## 2018-06-11 ENCOUNTER — Emergency Department (HOSPITAL_COMMUNITY): Payer: Medicaid Other

## 2018-06-11 DIAGNOSIS — R531 Weakness: Secondary | ICD-10-CM | POA: Diagnosis present

## 2018-06-11 DIAGNOSIS — E7800 Pure hypercholesterolemia, unspecified: Secondary | ICD-10-CM

## 2018-06-11 DIAGNOSIS — Z8673 Personal history of transient ischemic attack (TIA), and cerebral infarction without residual deficits: Secondary | ICD-10-CM | POA: Diagnosis not present

## 2018-06-11 DIAGNOSIS — Z79899 Other long term (current) drug therapy: Secondary | ICD-10-CM | POA: Diagnosis not present

## 2018-06-11 DIAGNOSIS — Z794 Long term (current) use of insulin: Secondary | ICD-10-CM | POA: Diagnosis not present

## 2018-06-11 DIAGNOSIS — J449 Chronic obstructive pulmonary disease, unspecified: Secondary | ICD-10-CM | POA: Diagnosis present

## 2018-06-11 DIAGNOSIS — E119 Type 2 diabetes mellitus without complications: Secondary | ICD-10-CM

## 2018-06-11 DIAGNOSIS — I10 Essential (primary) hypertension: Secondary | ICD-10-CM | POA: Diagnosis not present

## 2018-06-11 DIAGNOSIS — Z950 Presence of cardiac pacemaker: Secondary | ICD-10-CM | POA: Diagnosis present

## 2018-06-11 DIAGNOSIS — M79671 Pain in right foot: Secondary | ICD-10-CM

## 2018-06-11 DIAGNOSIS — I495 Sick sinus syndrome: Secondary | ICD-10-CM | POA: Diagnosis present

## 2018-06-11 DIAGNOSIS — Z7982 Long term (current) use of aspirin: Secondary | ICD-10-CM | POA: Insufficient documentation

## 2018-06-11 DIAGNOSIS — G4733 Obstructive sleep apnea (adult) (pediatric): Secondary | ICD-10-CM | POA: Diagnosis present

## 2018-06-11 DIAGNOSIS — E1169 Type 2 diabetes mellitus with other specified complication: Secondary | ICD-10-CM

## 2018-06-11 DIAGNOSIS — N400 Enlarged prostate without lower urinary tract symptoms: Secondary | ICD-10-CM | POA: Diagnosis present

## 2018-06-11 DIAGNOSIS — Z720 Tobacco use: Secondary | ICD-10-CM | POA: Insufficient documentation

## 2018-06-11 DIAGNOSIS — R569 Unspecified convulsions: Secondary | ICD-10-CM

## 2018-06-11 DIAGNOSIS — R42 Dizziness and giddiness: Secondary | ICD-10-CM

## 2018-06-11 DIAGNOSIS — G8929 Other chronic pain: Secondary | ICD-10-CM | POA: Diagnosis present

## 2018-06-11 DIAGNOSIS — E669 Obesity, unspecified: Secondary | ICD-10-CM

## 2018-06-11 LAB — CBC
HCT: 46.5 % (ref 39.0–52.0)
Hemoglobin: 14.1 g/dL (ref 13.0–17.0)
MCH: 26.9 pg (ref 26.0–34.0)
MCHC: 30.3 g/dL (ref 30.0–36.0)
MCV: 88.6 fL (ref 78.0–100.0)
Platelets: 194 10*3/uL (ref 150–400)
RBC: 5.25 MIL/uL (ref 4.22–5.81)
RDW: 14.7 % (ref 11.5–15.5)
WBC: 7.2 10*3/uL (ref 4.0–10.5)

## 2018-06-11 LAB — TROPONIN I: Troponin I: <0.03 ng/mL (ref <0.03)

## 2018-06-11 LAB — URINALYSIS, ROUTINE W REFLEX MICROSCOPIC
BILIRUBIN URINE: NEGATIVE
GLUCOSE, UA: NEGATIVE mg/dL
HGB URINE DIPSTICK: NEGATIVE
KETONES UR: NEGATIVE mg/dL
Leukocytes, UA: NEGATIVE
Nitrite: NEGATIVE
PH: 6 (ref 5.0–8.0)
Protein, ur: NEGATIVE mg/dL
Specific Gravity, Urine: 1.021 (ref 1.005–1.030)

## 2018-06-11 LAB — BASIC METABOLIC PANEL
Anion gap: 13 (ref 5–15)
BUN: 18 mg/dL (ref 8–23)
CHLORIDE: 106 mmol/L (ref 98–111)
CO2: 22 mmol/L (ref 22–32)
Calcium: 9.5 mg/dL (ref 8.9–10.3)
Creatinine, Ser: 0.78 mg/dL (ref 0.61–1.24)
GLUCOSE: 117 mg/dL — AB (ref 70–99)
POTASSIUM: 4.1 mmol/L (ref 3.5–5.1)
SODIUM: 141 mmol/L (ref 135–145)

## 2018-06-11 LAB — CBG MONITORING, ED: Glucose-Capillary: 119 mg/dL — ABNORMAL HIGH (ref 70–99)

## 2018-06-11 NOTE — ED Triage Notes (Signed)
Pt from home, home health RN called due to increased weakness and dizziness (last seen on Wednesday).  Wife says she noticed yesterday, he say last three days.  Hx of stroke w/ documented left sided weakness however he had a negative NIH score.

## 2018-06-11 NOTE — ED Notes (Signed)
Patient transported to CT and then Xray

## 2018-06-11 NOTE — ED Notes (Signed)
ED Provider at bedside. 

## 2018-06-11 NOTE — ED Provider Notes (Signed)
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    ED Discharge Orders        Ordered    atorvastatin (LIPITOR) 80 MG tablet  Daily-1800     06/13/18 0958    Discharge instructions    Comments:  Please keep your appointment at [facility] for the EEG.  Take your medications as before.  You were cared for by a hospitalist during your hospital stay. If you have any questions about your discharge medications or the care you received while you were in the hospital after you are discharged, you can call the unit and asked to speak with the hospitalist on call if the hospitalist that took care of you is not available. Once you are discharged, your primary care physician will handle any further medical issues. Please note that NO REFILLS for any discharge medications will be authorized once you are discharged, as it is imperative that you return to your primary care physician (or establish a relationship with a primary care  physician if you do not have one) for your aftercare needs so that they can reassess your need for medications and monitor your lab values. If you do not have a primary care physician, you can call  [phone] for a physician referral.   06/13/18 1354    Increase activity slowly     06/13/18 1354    Call MD for:  temperature >100.4     06/13/18 1354    Call MD for:  persistant nausea and vomiting     06/13/18 1354    Call MD for:  severe uncontrolled pain     06/13/18 1354    Call MD for:  persistant dizziness or light-headedness     06/13/18 1354    Call MD for:  extreme fatigue     06/13/18 1354       Julianne Rice, MD 06/13/18 1655

## 2018-06-11 NOTE — ED Notes (Signed)
Patient returned from radiology

## 2018-06-12 ENCOUNTER — Other Ambulatory Visit: Payer: Self-pay

## 2018-06-12 ENCOUNTER — Encounter (HOSPITAL_COMMUNITY): Payer: Self-pay | Admitting: Internal Medicine

## 2018-06-12 ENCOUNTER — Observation Stay (HOSPITAL_COMMUNITY): Payer: Medicaid Other

## 2018-06-12 ENCOUNTER — Emergency Department (HOSPITAL_COMMUNITY): Payer: Medicaid Other

## 2018-06-12 DIAGNOSIS — G4733 Obstructive sleep apnea (adult) (pediatric): Secondary | ICD-10-CM

## 2018-06-12 DIAGNOSIS — E1169 Type 2 diabetes mellitus with other specified complication: Secondary | ICD-10-CM | POA: Diagnosis not present

## 2018-06-12 DIAGNOSIS — R531 Weakness: Secondary | ICD-10-CM | POA: Diagnosis not present

## 2018-06-12 DIAGNOSIS — E669 Obesity, unspecified: Secondary | ICD-10-CM | POA: Diagnosis not present

## 2018-06-12 LAB — CBC
HEMATOCRIT: 46 % (ref 39.0–52.0)
Hemoglobin: 14.1 g/dL (ref 13.0–17.0)
MCH: 27 pg (ref 26.0–34.0)
MCHC: 30.7 g/dL (ref 30.0–36.0)
MCV: 88 fL (ref 78.0–100.0)
PLATELETS: 199 10*3/uL (ref 150–400)
RBC: 5.23 MIL/uL (ref 4.22–5.81)
RDW: 14.9 % (ref 11.5–15.5)
WBC: 6.6 10*3/uL (ref 4.0–10.5)

## 2018-06-12 LAB — HEMOGLOBIN A1C
HEMOGLOBIN A1C: 6.5 % — AB (ref 4.8–5.6)
Mean Plasma Glucose: 139.85 mg/dL

## 2018-06-12 LAB — GLUCOSE, CAPILLARY
Glucose-Capillary: 89 mg/dL (ref 70–99)
Glucose-Capillary: 106 mg/dL — ABNORMAL HIGH (ref 70–99)
Glucose-Capillary: 129 mg/dL — ABNORMAL HIGH (ref 70–99)
Glucose-Capillary: 112 mg/dL — ABNORMAL HIGH (ref 70–99)

## 2018-06-12 LAB — TROPONIN I
Troponin I: <0.03 ng/mL (ref <0.03)
Troponin I: <0.03 ng/mL (ref <0.03)

## 2018-06-12 LAB — LIPID PANEL
CHOL/HDL RATIO: 3.9 ratio
Cholesterol: 197 mg/dL (ref 0–200)
HDL: 51 mg/dL (ref >40)
LDL Cholesterol: 126 mg/dL — ABNORMAL HIGH (ref 0–99)
Triglycerides: 101 mg/dL (ref <150)
VLDL: 20 mg/dL (ref 0–40)

## 2018-06-12 LAB — CREATININE, SERUM: Creatinine, Ser: 0.7 mg/dL (ref 0.61–1.24)

## 2018-06-12 LAB — HIV ANTIBODY (ROUTINE TESTING W REFLEX): HIV Screen 4th Generation wRfx: NONREACTIVE

## 2018-06-12 MED ORDER — ATORVASTATIN CALCIUM 40 MG PO TABS: 40.0000 mg | ORAL_TABLET | Freq: Every day | ORAL | Status: DC

## 2018-06-12 MED ORDER — ALBUTEROL SULFATE (2.5 MG/3ML) 0.083% IN NEBU: 3.0000 mL | INHALATION_SOLUTION | Freq: Four times a day (QID) | RESPIRATORY_TRACT | Status: DC | PRN

## 2018-06-12 MED ORDER — POLYETHYLENE GLYCOL 3350 17 G PO PACK: 17.0000 g | PACK | Freq: Every day | ORAL | Status: DC | PRN

## 2018-06-12 MED ORDER — GABAPENTIN 300 MG PO CAPS
300.0000 mg | ORAL_CAPSULE | Freq: Three times a day (TID) | ORAL | Status: DC
  Administered 2018-06-12 – 2018-06-13 (×4): 300 mg via ORAL
  Filled 2018-06-12 (×4): qty 1

## 2018-06-12 MED ORDER — FINASTERIDE 5 MG PO TABS
5.0000 mg | ORAL_TABLET | Freq: Every day | ORAL | Status: DC
  Administered 2018-06-12 – 2018-06-13 (×2): 5 mg via ORAL
  Filled 2018-06-12 (×2): qty 1

## 2018-06-12 MED ORDER — ACETAMINOPHEN 160 MG/5ML PO SOLN: 650.0000 mg | ORAL | Status: DC | PRN

## 2018-06-12 MED ORDER — ACETAMINOPHEN 650 MG RE SUPP: 650.0000 mg | RECTAL | Status: DC | PRN

## 2018-06-12 MED ORDER — DORZOLAMIDE HCL 2 % OP SOLN
1.0000 [drp] | Freq: Two times a day (BID) | OPHTHALMIC | Status: DC
  Administered 2018-06-12 – 2018-06-13 (×3): 1 [drp] via OPHTHALMIC
  Filled 2018-06-12: qty 10

## 2018-06-12 MED ORDER — INSULIN ASPART 100 UNIT/ML ~~LOC~~ SOLN
0.0000 [IU] | Freq: Three times a day (TID) | SUBCUTANEOUS | Status: DC
  Administered 2018-06-12: 1 [IU] via SUBCUTANEOUS
  Administered 2018-06-13: 2 [IU] via SUBCUTANEOUS

## 2018-06-12 MED ORDER — ASPIRIN 325 MG PO TABS
325.0000 mg | ORAL_TABLET | Freq: Every day | ORAL | Status: DC
  Administered 2018-06-12 – 2018-06-13 (×2): 325 mg via ORAL
  Filled 2018-06-12 (×2): qty 1

## 2018-06-12 MED ORDER — IOPAMIDOL (ISOVUE-370) INJECTION 76%
50.0000 mL | Freq: Once | INTRAVENOUS | Status: AC | PRN
  Administered 2018-06-12: 50 mL via INTRAVENOUS

## 2018-06-12 MED ORDER — TAMSULOSIN HCL 0.4 MG PO CAPS
0.4000 mg | ORAL_CAPSULE | Freq: Every day | ORAL | Status: DC
  Administered 2018-06-12: 0.4 mg via ORAL
  Filled 2018-06-12: qty 1

## 2018-06-12 MED ORDER — VITAMIN B-12 1000 MCG PO TABS
500.0000 ug | ORAL_TABLET | Freq: Every day | ORAL | Status: DC
  Administered 2018-06-12 – 2018-06-13 (×2): 500 ug via ORAL
  Filled 2018-06-12 (×2): qty 1

## 2018-06-12 MED ORDER — ASPIRIN 325 MG PO TABS: 325.0000 mg | ORAL_TABLET | Freq: Every day | ORAL | Status: DC

## 2018-06-12 MED ORDER — LISINOPRIL 10 MG PO TABS
10.0000 mg | ORAL_TABLET | Freq: Every day | ORAL | Status: DC
  Administered 2018-06-12 – 2018-06-13 (×2): 10 mg via ORAL
  Filled 2018-06-12 (×2): qty 1

## 2018-06-12 MED ORDER — ENOXAPARIN SODIUM 40 MG/0.4ML ~~LOC~~ SOLN
40.0000 mg | SUBCUTANEOUS | Status: DC
  Administered 2018-06-12 – 2018-06-13 (×2): 40 mg via SUBCUTANEOUS
  Filled 2018-06-12 (×2): qty 0.4

## 2018-06-12 MED ORDER — ATORVASTATIN CALCIUM 80 MG PO TABS
80.0000 mg | ORAL_TABLET | Freq: Every day | ORAL | Status: DC
  Administered 2018-06-12: 80 mg via ORAL
  Filled 2018-06-12: qty 1

## 2018-06-12 MED ORDER — ENSURE SURGERY PO LIQD
237.0000 mL | Freq: Two times a day (BID) | ORAL | Status: DC
  Administered 2018-06-12 – 2018-06-13 (×2): 237 mL via ORAL
  Filled 2018-06-12 (×8): qty 237

## 2018-06-12 MED ORDER — LEVETIRACETAM 750 MG PO TABS
1500.0000 mg | ORAL_TABLET | Freq: Two times a day (BID) | ORAL | Status: DC
  Administered 2018-06-12 – 2018-06-13 (×3): 1500 mg via ORAL
  Filled 2018-06-12 (×4): qty 2

## 2018-06-12 MED ORDER — PANTOPRAZOLE SODIUM 40 MG PO TBEC
40.0000 mg | DELAYED_RELEASE_TABLET | Freq: Every day | ORAL | Status: DC
  Administered 2018-06-12 – 2018-06-13 (×2): 40 mg via ORAL
  Filled 2018-06-12 (×2): qty 1

## 2018-06-12 MED ORDER — PREMIER PROTEIN SHAKE
11.0000 [oz_av] | Freq: Two times a day (BID) | ORAL | Status: DC
  Administered 2018-06-12 – 2018-06-13 (×3): 11 [oz_av] via ORAL
  Filled 2018-06-12 (×7): qty 325.31

## 2018-06-12 MED ORDER — ACETAMINOPHEN 325 MG PO TABS
650.0000 mg | ORAL_TABLET | Freq: Once | ORAL | Status: AC
  Administered 2018-06-12: 650 mg via ORAL
  Filled 2018-06-12: qty 2

## 2018-06-12 MED ORDER — ACETAMINOPHEN 325 MG PO TABS
650.0000 mg | ORAL_TABLET | ORAL | Status: DC | PRN
  Administered 2018-06-12: 650 mg via ORAL
  Filled 2018-06-12: qty 2

## 2018-06-12 MED ORDER — FLUTICASONE PROPIONATE 50 MCG/ACT NA SUSP
2.0000 | Freq: Every day | NASAL | Status: DC
  Administered 2018-06-12 – 2018-06-13 (×2): 2 via NASAL
  Filled 2018-06-12: qty 16

## 2018-06-12 MED ORDER — BRIMONIDINE TARTRATE 0.15 % OP SOLN
1.0000 [drp] | Freq: Two times a day (BID) | OPHTHALMIC | Status: DC
  Administered 2018-06-12 – 2018-06-13 (×3): 1 [drp] via OPHTHALMIC
  Filled 2018-06-12: qty 5

## 2018-06-12 MED ORDER — BRIMONIDINE-DORZOLAMIDE 0.15-2 % OP SOLN: 1.0000 [drp] | Freq: Two times a day (BID) | OPHTHALMIC | Status: DC

## 2018-06-12 MED ORDER — LATANOPROST 0.005 % OP SOLN
1.0000 [drp] | Freq: Every day | OPHTHALMIC | Status: DC
  Administered 2018-06-12: 1 [drp] via OPHTHALMIC
  Filled 2018-06-12: qty 2.5

## 2018-06-12 MED ORDER — STROKE: EARLY STAGES OF RECOVERY BOOK
Freq: Once | Status: AC
  Administered 2018-06-12: 06:00:00
  Filled 2018-06-12: qty 1

## 2018-06-12 MED ORDER — ASPIRIN 300 MG RE SUPP: 300.0000 mg | Freq: Every day | RECTAL | Status: DC

## 2018-06-12 MED ORDER — VENLAFAXINE HCL ER 150 MG PO CP24
150.0000 mg | ORAL_CAPSULE | Freq: Every day | ORAL | Status: DC
  Administered 2018-06-12 – 2018-06-13 (×2): 150 mg via ORAL
  Filled 2018-06-12: qty 2
  Filled 2018-06-12 (×2): qty 1
  Filled 2018-06-12: qty 2

## 2018-06-12 MED ORDER — INSULIN GLARGINE 100 UNIT/ML ~~LOC~~ SOLN
30.0000 [IU] | Freq: Every day | SUBCUTANEOUS | Status: DC
  Administered 2018-06-12: 30 [IU] via SUBCUTANEOUS
  Filled 2018-06-12 (×2): qty 0.3
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
## 2018-06-12 NOTE — Plan of Care (Signed)
Wesley [name] has been drowsy most of the day, owing to no sleep yesterday/last night.  His neuro deficits remain stable:  NIH 7, Q2 Neuro ended at 0730 this morning.    Nursing POC:  facilitate sleep and rest, assist with ADLs, maximize safety and assess neuro Q4.

## 2018-06-12 NOTE — ED Provider Notes (Addendum)
  Physical Exam  BP (!) 139/96   Pulse 60   Temp 98 F (36.7 C) (Oral)   Resp 15   SpO2 98%   Physical Exam  ED Course/Procedures     Procedures  MDM  Assuming care of patient from Dr. Lita Mains.   Patient in the ED for vertigo and mild weakness on the R side. Workup thus far shows no acute findings. Pt's symptoms started 2-3 days ago, and he has a pacemaker in place therefore we cannot get MRI.  Concerning findings are as following: none Important pending results are: CT angio head and neck. CT head is neg for any ischemia like findings.  According to Dr. Lita Mains, plan is to consult neurology post CT-A.   Patient had no complains, no concerns from the nursing side. Will continue to monitor.     Varney Biles, MD 06/12/18 0006  3:09 AM CT scan results are reassuring.  Patient does have right-sided deficit, neurology has seen the patient and are recommending that he come in for observation for neuro checks.  They do not foresee any more adjustment to his medication, however patient might need a PT, OT and possibly rehab.   Varney Biles, MD 06/12/18 [phone]
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
## 2018-06-12 NOTE — Progress Notes (Signed)
CT with no acute findings and his symptoms have improved. I doubt stroke/tia, but he is already on ASA and statin. Agree with increasing statin given history of stroke. He is scheduled for admission for spell characterization at [facility] next week and I would encourage him to keep this appointment.   Please call with further questions or concerns.   Roland Rack, MD [facility] [phone]  If 7pm- 7am, please page neurology on call as listed in [place].

## 2018-06-12 NOTE — ED Notes (Signed)
Attempted to call report

## 2018-06-12 NOTE — Progress Notes (Addendum)
[facility] PROGRESS NOTE  Wesley [name] GYF:[id number] DOB: Jan 12, 1956 DOA: 06/11/2018  PCP: Boykin Nearing, MD  Brief History/Interval Summary: 62 year old African-American male with a past medical history of sick sinus syndrome status post pacemaker, previous history of stroke with left-sided weakness, history of seizures on Keppra, diabetes mellitus on Lantus presented with complains of right-sided weakness.  Patient CT scan was unremarkable.  CT angiogram of head and neck did not show any large vessel occlusion.  Neurology was consulted.  Patient was hospitalized for further management.  Reason for Visit: Possible stroke  Consultants: Neurology  Procedures: Transthoracic echocardiogram is pending  Antibiotics: None  Subjective/Interval History: Patient poor historian.  Unable to give me a lot of information.  Continues to feel weakness in his right arm.  ROS: Denies any chest pain or shortness of breath.  Objective:  Vital Signs  Vitals:   06/12/18 0432 06/12/18 0445 06/12/18 0500 06/12/18 0537  BP:  (!) 145/98 117/84 (!) 139/97  Pulse:  64 60 60  Resp:  12 15 18   Temp:    97.7 F (36.5 C)  TempSrc:    Oral  SpO2: 98% 96% 96% 98%  Weight:    93.5 kg (206 lb 2.1 oz)  Height:    6\' 2"  (1.88 m)   No intake or output data in the 24 hours ending 06/12/18 1105 Filed Weights   06/12/18 0537  Weight: 93.5 kg (206 lb 2.1 oz)    General appearance: alert, cooperative, appears stated age, distracted and no distress Resp: clear to auscultation bilaterally Cardio: regular rate and rhythm, S1, S2 normal, no murmur, click, rub or gallop GI: soft, non-tender; bowel sounds normal; no masses,  no organomegaly Extremities: extremities normal, atraumatic, no cyanosis or edema Neurologic: Patient appears to be distracted.  Does not answer questions.  Noted to have right upper extremity weakness.  No facial asymmetry noted.  Tongue is at midline.  Lab Results:  Data  Reviewed: I have personally reviewed following labs and imaging studies  CBC: Recent Labs  Lab 06/11/18 1938 06/12/18 0449  WBC 7.2 6.6  HGB 14.1 14.1  HCT 46.5 46.0  MCV 88.6 88.0  PLT 194 916    Basic Metabolic Panel: Recent Labs  Lab 06/11/18 1938 06/12/18 0449  NA 141  --   K 4.1  --   CL 106  --   CO2 22  --   GLUCOSE 117*  --   BUN 18  --   CREATININE 0.78 0.70  CALCIUM 9.5  --     GFR: Estimated Creatinine Clearance: 112.7 mL/min (by C-G formula based on SCr of 0.7 mg/dL).  Cardiac Enzymes: Recent Labs  Lab 06/11/18 1938 06/12/18 0644  TROPONINI <0.03 <0.03    HbA1C: Recent Labs    06/12/18 0449  HGBA1C 6.5*    CBG: Recent Labs  Lab 06/11/18 1928 06/12/18 0621  GLUCAP 119* 89    Lipid Profile: Recent Labs    06/12/18 0449  CHOL 197  HDL 51  LDLCALC 126*  TRIG 101  CHOLHDL 3.9      Radiology Studies: Ct Angio Head W Or Wo Contrast  Result Date: 06/12/2018 CLINICAL DATA:  Left-sided weakness. EXAM: CT ANGIOGRAPHY HEAD AND NECK TECHNIQUE: Multidetector CT imaging of the head and neck was performed using the standard protocol during bolus administration of intravenous contrast. Multiplanar CT image reconstructions and MIPs were obtained to evaluate the vascular anatomy. Carotid stenosis measurements (when applicable) are obtained utilizing NASCET criteria, using  the distal internal carotid diameter as the denominator. CONTRAST:  16mL ISOVUE-370 IOPAMIDOL (ISOVUE-370) INJECTION 76% COMPARISON:  Head CT 06/11/2018 FINDINGS: CTA NECK FINDINGS AORTIC ARCH: There is mild calcific atherosclerosis of the aortic arch. There is no aneurysm, dissection or hemodynamically significant stenosis of the visualized ascending aorta and aortic arch. Conventional 3 vessel aortic branching pattern. The visualized proximal subclavian arteries are widely patent. RIGHT CAROTID SYSTEM: --Common carotid artery: Widely patent origin without common carotid artery  dissection or aneurysm. --Internal carotid artery: No dissection, occlusion or aneurysm. No hemodynamically significant stenosis. --External carotid artery: No acute abnormality. LEFT CAROTID SYSTEM: --Common carotid artery: Widely patent origin without common carotid artery dissection or aneurysm. --Internal carotid artery:No dissection, occlusion or aneurysm. No hemodynamically significant stenosis. --External carotid artery: No acute abnormality. VERTEBRAL ARTERIES: Left dominant configuration. Both origins are normal. No dissection, occlusion or flow-limiting stenosis to the vertebrobasilar confluence. SKELETON: There is no bony spinal canal stenosis. No lytic or blastic lesion. OTHER NECK: Normal pharynx, larynx and major salivary glands. No cervical lymphadenopathy. Unremarkable thyroid gland. UPPER CHEST: Biapical bullae, right larger than left. CTA HEAD FINDINGS ANTERIOR CIRCULATION: --Intracranial internal carotid arteries: Atherosclerotic calcification of the internal carotid arteries at the skull base without hemodynamically significant stenosis. --Anterior cerebral arteries: Normal. Both A1 segments are present. Patent anterior communicating artery. --Middle cerebral arteries: Normal. --Posterior communicating arteries: Absent bilaterally. 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 --Posterior cerebral arteries: Normal. --Superior cerebellar arteries: Normal. --Inferior cerebellar arteries: Normal anterior and posterior inferior cerebellar arteries. VENOUS SINUSES: As permitted by contrast timing, patent. ANATOMIC VARIANTS: None DELAYED PHASE: No parenchymal contrast enhancement. Review of the MIP images confirms the above findings. IMPRESSION: 1. No emergent large vessel occlusion or hemodynamically significant stenosis. 2. Biapical pulmonary bullae. Aortic Atherosclerosis (ICD10-I70.0) and Emphysema (ICD10-J43.9). Electronically Signed   By: Ulyses Jarred M.D.   On:  06/12/2018 01:05   Ct Head Wo Contrast  Result Date: 06/11/2018 CLINICAL DATA:  Worsening weakness and dizziness. History of CVA. No reported injury. EXAM: CT HEAD WITHOUT CONTRAST TECHNIQUE: Contiguous axial images were obtained from the base of the skull through the vertex without intravenous contrast. COMPARISON:  04/24/2018 head CT. FINDINGS: Brain: No evidence of parenchymal hemorrhage or extra-axial fluid collection. No mass lesion, mass effect, or midline shift. No CT evidence of acute infarction. Nonspecific mild to moderate subcortical and periventricular white matter hypodensity, most in keeping with chronic small vessel ischemic change. Generalized cerebral volume loss. No ventriculomegaly. Vascular: No acute abnormality. Skull: No evidence of calvarial fracture. Sinuses/Orbits: Mild mucoperiosteal thickening in the ethmoidal air cells. No fluid levels. Other:  The mastoid air cells are unopacified. IMPRESSION: 1.  No evidence of acute intracranial abnormality. 2. Generalized cerebral volume loss and mild-to-moderate chronic small vessel ischemic changes in the cerebral white matter. 3. Mild paranasal sinusitis, chronic appearing. Electronically Signed   By: Ilona Sorrel M.D.   On: 06/11/2018 21:37   Ct Angio Neck W And/or Wo Contrast  Result Date: 06/12/2018 CLINICAL DATA:  Left-sided weakness. EXAM: CT ANGIOGRAPHY HEAD AND NECK TECHNIQUE: Multidetector CT imaging of the head and neck was performed using the standard protocol during bolus administration of intravenous contrast. Multiplanar CT image reconstructions and MIPs were obtained to evaluate the vascular anatomy. Carotid stenosis measurements (when applicable) are obtained utilizing NASCET criteria, using the distal internal carotid diameter as the denominator. CONTRAST:  20mL ISOVUE-370 IOPAMIDOL (ISOVUE-370) INJECTION 76% COMPARISON:  Head CT 06/11/2018 FINDINGS: CTA NECK FINDINGS AORTIC ARCH: There is  mild calcific atherosclerosis of  the aortic arch. There is no aneurysm, dissection or hemodynamically significant stenosis of the visualized ascending aorta and aortic arch. Conventional 3 vessel aortic branching pattern. The visualized proximal subclavian arteries are widely patent. RIGHT CAROTID SYSTEM: --Common carotid artery: Widely patent origin without common carotid artery dissection or aneurysm. --Internal carotid artery: No dissection, occlusion or aneurysm. No hemodynamically significant stenosis. --External carotid artery: No acute abnormality. LEFT CAROTID SYSTEM: --Common carotid artery: Widely patent origin without common carotid artery dissection or aneurysm. --Internal carotid artery:No dissection, occlusion or aneurysm. No hemodynamically significant stenosis. --External carotid artery: No acute abnormality. VERTEBRAL ARTERIES: Left dominant configuration. Both origins are normal. No dissection, occlusion or flow-limiting stenosis to the vertebrobasilar confluence. SKELETON: There is no bony spinal canal stenosis. No lytic or blastic lesion. OTHER NECK: Normal pharynx, larynx and major salivary glands. No cervical lymphadenopathy. Unremarkable thyroid gland. UPPER CHEST: Biapical bullae, right larger than left. CTA HEAD FINDINGS ANTERIOR CIRCULATION: --Intracranial internal carotid arteries: Atherosclerotic calcification of the internal carotid arteries at the skull base without hemodynamically significant stenosis. --Anterior cerebral arteries: Normal. Both A1 segments are present. Patent anterior communicating artery. --Middle cerebral arteries: Normal. --Posterior communicating arteries: Absent bilaterally. 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 --Posterior cerebral arteries: Normal. --Superior cerebellar arteries: Normal. --Inferior cerebellar arteries: Normal anterior and posterior inferior cerebellar arteries. VENOUS SINUSES: As permitted by contrast timing, patent. ANATOMIC VARIANTS:  None DELAYED PHASE: No parenchymal contrast enhancement. Review of the MIP images confirms the above findings. IMPRESSION: 1. No emergent large vessel occlusion or hemodynamically significant stenosis. 2. Biapical pulmonary bullae. Aortic Atherosclerosis (ICD10-I70.0) and Emphysema (ICD10-J43.9). Electronically Signed   By: Ulyses Jarred M.D.   On: 06/12/2018 01:05     Medications:  Scheduled: . aspirin  300 mg Rectal Daily   Or  . aspirin  325 mg Oral Daily  . atorvastatin  40 mg Oral q1800  . brimonidine  1 drop Both Eyes BID  . dorzolamide  1 drop Both Eyes BID  . enoxaparin (LOVENOX) injection  40 mg Subcutaneous Q24H  . feeding supplement  237 mL Oral BID BM  . finasteride  5 mg Oral Daily  . fluticasone  2 spray Each Nare Daily  . gabapentin  300 mg Oral TID  . insulin aspart  0-9 Units Subcutaneous TID WC  . insulin glargine  30 Units Subcutaneous QHS  . latanoprost  1 drop Both Eyes QHS  . levETIRAcetam  1,500 mg Oral BID  . lisinopril  10 mg Oral Daily  . pantoprazole  40 mg Oral QAC breakfast  . protein supplement shake  11 oz Oral BID BM  . tamsulosin  0.4 mg Oral QPC supper  . venlafaxine XR  150 mg Oral Q breakfast  . vitamin B-12  500 mcg Oral Daily   Continuous:  TML:[id number] **OR** acetaminophen (TYLENOL) oral liquid 160 mg/5 mL **OR** acetaminophen, albuterol, polyethylene glycol  Assessment/Plan:    Right-sided weakness Concern is for acute stroke.  Patient was previous history of stroke resulting in left-sided deficits.  Initial CT scan did not show any acute findings.  Patient cannot have MRI due to pacemaker.  CT repeated today by neurology and does not show any acute findings either.  Neurology is following.  Echocardiogram is pending.  Patient is on aspirin and statin.  PT and OT evaluation.  Abnormal EKG Subtle ST segment elevation noted in inferior leads.  Old EKG reviewed and showed similar findings but perhaps not as  pronounced.  Patient denies  any chest pain.  Troponins are negative.  Echocardiogram is pending.  Do not anticipate any further work-up at this time.  Diabetes mellitus type 2 Patient is on Lantus.  Continue SSI.  Monitor CBGs.  Hyperlipidemia LDL noted to be 126.  Noted to be on statin.  We will increase dose.  History of essential hypertension Lisinopril  History of seizures Patient apparently has also had episodes of dizziness and near syncope for which he has had extensive outpatient work-up.  He is followed at the [place].  He has been referred to [facility] for further work-up including video EEG which is scheduled for next week.  Per neurology no need to do EEG at this time.  No seizure activity noted.  Continue Keppra.  History of COPD Not actively wheezing  History of sleep apnea CPAP  BPH Continue Flomax.  DVT Prophylaxis: Lovenox    Code Status: Full code Family Communication: No family at bedside Disposition Plan: Management as outlined above.  Anticipate discharge when work-up has been completed and when cleared by neurology.  Possibly tomorrow.   LOS: 0 days   [facility] Pager [phone] 06/12/2018, 11:05 AM  If 7PM-7AM, please contact night-coverage at [email or web address], password [facility]

## 2018-06-12 NOTE — Plan of Care (Signed)
Education given to pt and wife at bedside, verbalized understanding

## 2018-06-12 NOTE — Evaluation (Signed)
Physical Therapy Evaluation Patient Details Name: Wesley [name] MRN: [id number] DOB: 08-Feb-1956 Today's Date: 06/12/2018   History of Present Illness  Pt is a 62 year old man admitted with R side weakness and dizziness. PMH: sick sinus with pacemaker, CVA with L side weakness, seizures, dementia, arthritis, COPD. Pt with 3 admissions in 6 months. Head CT negative each admission.  Clinical Impression  Pt admitted with above diagnosis. Pt currently with functional limitations due to the deficits listed below (see PT Problem List). At the time of PT eval pt was able to perform transfers and ambulation with gross min guard assist for balance support and safety. Pt with multiple medical problems impacting mobility (B wrist pain, B shoulder pain, B knee pain, COPD, chronic back pain), however feel he is generally safe with AD and supervision. Recommend 24 hour support and resumption of HH services at d/c. Acutely, pt will benefit from skilled PT to increase their independence and safety with mobility to allow discharge to the venue listed below.       Follow Up Recommendations Home health PT;Supervision/Assistance - 24 hour    Equipment Recommendations  None recommended by PT    Recommendations for Other Services       Precautions / Restrictions Precautions Precautions: Fall Precaution Comments: Pt educated on back precautions for comfort due to chronic low back pain Restrictions Weight Bearing Restrictions: No      Mobility  Bed Mobility Overal bed mobility: Needs Assistance Bed Mobility: Supine to Sit     Supine to sit: Supervision     General bed mobility comments: No assist required. HOB slightly elevated.   Transfers Overall transfer level: Needs assistance Equipment used: Rolling walker (2 wheeled) Transfers: Sit to/from Stand Sit to Stand: Min guard         General transfer comment: Hands-on guarding for balance support. Pt initially reports dizziness however  improves with standing time.   Ambulation/Gait Ambulation/Gait assistance: Min guard Gait Distance (Feet): 150 Feet Assistive device: Rolling walker (2 wheeled) Gait Pattern/deviations: Step-through pattern;Decreased stride length Gait velocity: Decreased Gait velocity interpretation: <1.31 ft/sec, indicative of household ambulator General Gait Details: Slow and guarded but overall steady with RW for support. Pt denies SOB at end of gait training.   Stairs            Wheelchair Mobility    Modified Rankin (Stroke Patients Only)       Balance Overall balance assessment: Mild deficits observed, not formally tested                                           Pertinent Vitals/Pain Pain Assessment: Faces Faces Pain Scale: Hurts whole lot Pain Location: shoulders when raising overhead Pain Descriptors / Indicators: Discomfort;Grimacing Pain Intervention(s): Monitored during session    Home Living Family/patient expects to be discharged to:: Private residence Living Arrangements: Spouse/significant other Available Help at Discharge: Family;Available 24 hours/day Type of Home: House Home Access: Stairs to enter Entrance Stairs-Rails: Right Entrance Stairs-Number of Steps: 3 Home Layout: One level Home Equipment: Clinical cytogeneticist - 4 wheels;Cane - quad      Prior Function Level of Independence: Needs assistance   Gait / Transfers Assistance Needed: Has been using the rollator lately for balance support.   ADL's / Homemaking Assistance Needed: Wife assists in and out of the shower - pt states he sits down in the  tub at times and has difficulty getting back up.  Comments: Pt reports he has had HHPT/HHOT/HHRN coming in     Hand Dominance   Dominant Hand: Right    Extremity/Trunk Assessment   Upper Extremity Assessment Upper Extremity Assessment: RUE deficits/detail;LUE deficits/detail RUE Deficits / Details: Pt reported bilateral wrist pain due  to arthritis. Pt states he wears wrist braces almost all of the time, and has numbness in B hands (R worse than L) when ambulating a long distance    Lower Extremity Assessment Lower Extremity Assessment: RLE deficits/detail;LLE deficits/detail RLE Deficits / Details: Unable to completely extend knee for MMT. Noted minimal elevation of thigh to assess hip flexor strength. Pt states he has been getting injections in his knees. LLE Deficits / Details: Unable to completely extend knee for MMT. Noted minimal elevation of thigh to assess hip flexor strength. Pt states he has been getting injections in his knees.    Cervical / Trunk Assessment Cervical / Trunk Assessment: Other exceptions Cervical / Trunk Exceptions: Forward head posture with rounded shoulders but able to keep trunk mostly upright  Communication   Communication: No difficulties  Cognition Arousal/Alertness: Awake/alert Behavior During Therapy: WFL for tasks assessed/performed Overall Cognitive Status: History of cognitive impairments - at baseline                                        General Comments      Exercises     Assessment/Plan    PT Assessment Patient needs continued PT services  PT Problem List Decreased strength;Decreased range of motion;Decreased activity tolerance;Decreased balance;Decreased mobility;Decreased knowledge of use of DME;Decreased safety awareness;Decreased knowledge of precautions;Decreased cognition       PT Treatment Interventions DME instruction;Gait training;Stair training;Functional mobility training;Therapeutic activities;Therapeutic exercise;Neuromuscular re-education;Patient/family education    PT Goals (Current goals can be found in the Care Plan section)  Acute Rehab PT Goals Patient Stated Goal: Home at d/c PT Goal Formulation: With patient Time For Goal Achievement: 06/19/18 Potential to Achieve Goals: Good    Frequency Min 3X/week   Barriers to discharge         Co-evaluation PT/OT/SLP Co-Evaluation/Treatment: Yes Reason for Co-Treatment: To address functional/ADL transfers;For patient/therapist safety;Complexity of the patient's impairments (multi-system involvement) PT goals addressed during session: Mobility/safety with mobility;Balance;Proper use of DME OT goals addressed during session: ADL's and self-care       AM-PAC PT "6 Clicks" Daily Activity  Outcome Measure Difficulty turning over in bed (including adjusting bedclothes, sheets and blankets)?: None Difficulty moving from lying on back to sitting on the side of the bed? : A Little Difficulty sitting down on and standing up from a chair with arms (e.g., wheelchair, bedside commode, etc,.)?: A Little Help needed moving to and from a bed to chair (including a wheelchair)?: A Little Help needed walking in hospital room?: A Little Help needed climbing 3-5 steps with a railing? : A Little 6 Click Score: 19    End of Session Equipment Utilized During Treatment: Gait belt Activity Tolerance: Patient tolerated treatment well Patient left: in chair;with call bell/phone within reach Nurse Communication: Mobility status PT Visit Diagnosis: Unsteadiness on feet (R26.81);Difficulty in walking, not elsewhere classified (R26.2)    Time: 1349-1420 PT Time Calculation (min) (ACUTE ONLY): 31 min   Charges:   PT Evaluation $PT Eval Moderate Complexity: 1 Mod     PT G Codes:  Rolinda Roan, PT, DPT Acute Rehabilitation Services Pager: [phone]   Thelma Comp 06/12/2018, 3:05 PM

## 2018-06-12 NOTE — Evaluation (Signed)
Occupational Therapy Evaluation Patient Details Name: Wesley [name] MRN: [id number] DOB: August 22, 1956 Today's Date: 06/12/2018    History of Present Illness Pt is a 62 year old man admitted with R side weakness and dizziness. PMH: sick sinus with pacemaker, CVA with L side weakness, seizures, dementia, arthritis, COPD. Pt with 3 admissions in 6 months. Head CT negative each admission.   Clinical Impression   Pt can typically perform self care with assist for tub transfers by wife. He reports using a rollator, but there was a quad cane in the room. Pt is a poor historian. He reports generalized pain and uses wrist splints. Pt is generally weak and requires min guard assist for ADL. He is likely near his baseline. Will follow acutely. Recommend resumption of HHOT pt reports he currently has.    Follow Up Recommendations  Home health OT(resume HHOT)    Equipment Recommendations  None recommended by OT    Recommendations for Other Services       Precautions / Restrictions Precautions Precautions: Fall Precaution Comments: Pt educated on back precautions for comfort due to chronic low back pain Required Braces or Orthoses: Other Brace/Splint Other Brace/Splint: pt wears B wrist splints for pain Restrictions Weight Bearing Restrictions: No      Mobility Bed Mobility Overal bed mobility: Needs Assistance Bed Mobility: Supine to Sit     Supine to sit: Supervision     General bed mobility comments: No assist required. HOB slightly elevated.   Transfers Overall transfer level: Needs assistance Equipment used: Rolling walker (2 wheeled) Transfers: Sit to/from Stand Sit to Stand: Min guard         General transfer comment: Hands-on guarding for balance support. Pt initially reports dizziness however improves with standing time.     Balance Overall balance assessment: Mild deficits observed, not formally tested                                          ADL either performed or assessed with clinical judgement   ADL Overall ADL's : Needs assistance/impaired Eating/Feeding: Independent;Sitting   Grooming: Standing;Supervision/safety;Min guard;Modified independent   Upper Body Bathing: Set up;Sitting   Lower Body Bathing: Sit to/from stand;Min guard   Upper Body Dressing : Set up;Sitting   Lower Body Dressing: Sit to/from stand;Min guard Lower Body Dressing Details (indicate cue type and reason): bends forward to don socks Toilet Transfer: Min guard;Ambulation;RW   Toileting- Water quality scientist and Hygiene: Min guard;Sit to/from stand       Functional mobility during ADLs: Min guard;Rolling walker       Vision Baseline Vision/History: Wears glasses Wears Glasses: Reading only Patient Visual Report: No change from baseline       Perception     Praxis      Pertinent Vitals/Pain Pain Assessment: Faces Faces Pain Scale: No hurt Pain Location: shoulders when raising overhead Pain Descriptors / Indicators: Discomfort;Grimacing Pain Intervention(s): Monitored during session     Hand Dominance Right   Extremity/Trunk Assessment Upper Extremity Assessment Upper Extremity Assessment: RUE deficits/detail;LUE deficits/detail RUE Deficits / Details: Pt reported bilateral wrist pain due to arthritis. Pt states he wears wrist braces almost all of the time, and has numbness in B hands (R worse than L) when ambulating a long distance   Lower Extremity Assessment Lower Extremity Assessment: RLE deficits/detail;LLE deficits/detail RLE Deficits / Details: Unable to completely extend knee for MMT.  Noted minimal elevation of thigh to assess hip flexor strength. Pt states he has been getting injections in his knees. LLE Deficits / Details: Unable to completely extend knee for MMT. Noted minimal elevation of thigh to assess hip flexor strength. Pt states he has been getting injections in his knees.   Cervical / Trunk  Assessment Cervical / Trunk Assessment: Other exceptions Cervical / Trunk Exceptions: Forward head posture with rounded shoulders but able to keep trunk mostly upright   Communication Communication Communication: No difficulties   Cognition Arousal/Alertness: Awake/alert Behavior During Therapy: WFL for tasks assessed/performed Overall Cognitive Status: History of cognitive impairments - at baseline                                 General Comments: poor historian   General Comments       Exercises     Shoulder Instructions      Home Living Family/patient expects to be discharged to:: Private residence Living Arrangements: Spouse/significant other Available Help at Discharge: Family;Available 24 hours/day Type of Home: House Home Access: Stairs to enter [employer] of Steps: 3 Entrance Stairs-Rails: Right Home Layout: One level     Bathroom Shower/Tub: Teacher, early years/pre: Standard     Home Equipment: Clinical cytogeneticist - 4 wheels;Cane - quad      Lives With: Spouse;Son    Prior Functioning/Environment Level of Independence: Needs assistance  Gait / Transfers Assistance Needed: Has been using the rollator lately for balance support.  ADL's / Homemaking Assistance Needed: Wife assists in and out of the shower - pt states he sits down in the tub at times and has difficulty getting back up.   Comments: Pt reports he has had HHPT/HHOT/HHRN coming in        OT Problem List: Impaired balance (sitting and/or standing);Decreased coordination;Decreased cognition;Decreased safety awareness;Decreased knowledge of use of DME or AE;Pain      OT Treatment/Interventions: Self-care/ADL training;DME and/or AE instruction;Patient/family education;Balance training    OT Goals(Current goals can be found in the care plan section) Acute Rehab OT Goals Patient Stated Goal: Home at d/c OT Goal Formulation: With patient Time For Goal  Achievement: 06/25/18 Potential to Achieve Goals: Good  OT Frequency: Min 2X/week   Barriers to D/C:            Co-evaluation PT/OT/SLP Co-Evaluation/Treatment: Yes Reason for Co-Treatment: To address functional/ADL transfers;For patient/therapist safety;Complexity of the patient's impairments (multi-system involvement) PT goals addressed during session: Mobility/safety with mobility;Balance;Proper use of DME OT goals addressed during session: ADL's and self-care      AM-PAC PT "6 Clicks" Daily Activity     Outcome Measure Help from another person eating meals?: None Help from another person taking care of personal grooming?: A Little Help from another person toileting, which includes using toliet, bedpan, or urinal?: A Little Help from another person bathing (including washing, rinsing, drying)?: A Little Help from another person to put on and taking off regular upper body clothing?: None Help from another person to put on and taking off regular lower body clothing?: A Little 6 Click Score: 20   End of Session Equipment Utilized During Treatment: Gait belt;Rolling walker  Activity Tolerance: Patient tolerated treatment well Patient left: in chair;with call bell/phone within reach;with chair alarm set  OT Visit Diagnosis: Other abnormalities of gait and mobility (R26.89);Unsteadiness on feet (R26.81);Other symptoms and signs involving cognitive function;Pain;History of falling (Z91.81)  Time: 4604-7998 OT Time Calculation (min): 17 min Charges:  OT General Charges $OT Visit: 1 Visit OT Evaluation $OT Eval Moderate Complexity: 1 Mod G-Codes:     Malka So 06/12/2018, 3:34 PM  06/12/2018 Nestor Lewandowsky, OTR/L Pager: [phone]

## 2018-06-12 NOTE — Consult Note (Addendum)
Neurology Consultation  Reason for Consult: RSW, dizziness Referring Physician: Dr. Kathrynn Humble  CC: RSW, dizziness  History is obtained from: Patient, chart review, a few pages of chart from the [facility] that his wife had.  HPI: Wesley [name] is a 62 y.o. male was a past medical history of hypertension, hyperlipidemia, a prior stroke with documented left-sided weakness, essential tremors, substance abuse, pacemaker, syncope and presyncopal episodes, and also documented dementia in the [place] paperwork, was brought in to the emergency room for evaluation of dizziness and right-sided weakness. Both him and his wife are relatively poor historians.  Over the past 2 to 3 days, the patient has experienced some ongoing weakness of his right upper and lower extremity along with dizziness.  The dizziness is now subsided.  When it was present, the dizziness was worse on sitting up or standing and subsided when he laid down.  No recent injuries or falls that the wife could tell me but she also says that if he had a fall while she was at work, he would not tell her. Today, the patient was with a home health nurse, who noted that his speech sounded slurred.  The patient called the triage nurse-?  At the [facility] or [place]-unclear-who recommended that he come to the emergency room for evaluation. On examination in the ER, he was noted to have right-sided weakness.  Noncontrast CT of the head was unchanged.  CT angiogram of the head and neck was recommended when an initial call was placed to discuss the case with me over the phone.  The CT angiogram of the head and neck also does not show any large vessel occlusion or any significant stenosis. Reviewing the patient's chart, he has had multiple evaluations by neurology at this hospital in 2017 and prior for presyncope and syncopal episodes.  He has had multiple EEGs, which have all been negative for any focal abnormality-sharp waves/seizure/slowing. The wife tells me  that he is scheduled for a long-term video EEG of 7-day duration at [facility] next week. He was recently at the [facility] hospital for a bladder scope and following that for another appointment, when he had a seizure-like activity.  The description of the seizure-like activity sounded most like syncope very became lightheaded dizzy and nearly lost consciousness with some following shakiness-concerning for convulsive syncope rather than actual seizures.  But the wife said that he has had witnessed seizures before, for which she is on Keppra.  Currently in the emergency room, he does not really offer any complaints but the wife reports the right-sided weakness and him just not feeling right.   LKW: At least 3 days ago tpa given?: no, outside the window Premorbid modified Rankin scale (mRS): 3  ROS: ROS was performed and is negative except as noted in the HPI.   Past Medical History:  Diagnosis Date  . Arthritis    knees  . Blood transfusion without reported diagnosis    during pacemaker surger  . BPH (benign prostatic hyperplasia)   . Cataract    bilateral  . Chronic insomnia 03/31/2016  . COPD (chronic obstructive pulmonary disease) ([place])   . Hyperlipidemia   . Hypertension   . Left-sided weakness 08/28/2016  . Myocardial infarction ([place])   . Pacemaker   . Shortness of breath   . Stroke ([place])   . Substance abuse ([place])   . Tremor, essential 03/31/2016  . Tremors of nervous system   . Tuberculosis    2000  Dementia  Depression   Family History  Problem Relation Age of Onset  . Cancer Mother   . Cancer Father   . Cancer Sister        lung  . Glaucoma Brother   . Cancer Brother   . Colon cancer Neg Hx   . Dementia Neg Hx   . Tremor Neg Hx    Social History:   reports that he quit smoking about 22 months ago. His smoking use included cigarettes. He has a 40.00 pack-year smoking history. He has never used smokeless tobacco. He reports that he does not drink alcohol or use  drugs.   Medications No current facility-administered medications for this encounter.   Current Outpatient Medications:  .  albuterol (PROVENTIL HFA;VENTOLIN HFA) 108 (90 Base) MCG/ACT inhaler, Inhale 2 puffs into the lungs every 6 (six) hours as needed for wheezing or shortness of breath., Disp: 1 Inhaler, Rfl: 2 .  aspirin 325 MG tablet, Take 325 mg by mouth daily., Disp: , Rfl:  .  atorvastatin (LIPITOR) 40 MG tablet, Take 1 tablet (40 mg total) by mouth daily at 6 PM., Disp: 30 tablet, Rfl: 1 .  Blood Glucose Monitoring Suppl (ACCU-CHEK AVIVA PLUS) w/Device KIT, 1 Device by Does not apply route 4 (four) times daily., Disp: 1 kit, Rfl: 0 .  Brimonidine-Dorzolamide 0.15-2 % SOLN, Place 1 drop into both eyes 2 (two) times daily., Disp: , Rfl:  .  carboxymethylcellulose (REFRESH PLUS) 0.5 % SOLN, Place 1 drop into both eyes at bedtime as needed., Disp: , Rfl:  .  cetirizine (ZYRTEC) 10 MG tablet, Take 1 tablet (10 mg total) by mouth daily., Disp: 30 tablet, Rfl: 1 .  Cholecalciferol (VITAMIN D3) 2000 UNITS TABS, Take 2,000 Units by mouth daily., Disp: 30 tablet, Rfl: 11 .  diclofenac sodium (VOLTAREN) 1 % GEL, Apply 4 g topically 4 (four) times daily., Disp: , Rfl:  .  Elastic Bandages & Supports (WRIST SPLINT/ELASTIC LEFT LG) MISC, 1 each by Does not apply route daily., Disp: 1 each, Rfl: 0 .  escitalopram (LEXAPRO) 20 MG tablet, Take 1 tablet (20 mg total) by mouth daily., Disp: 30 tablet, Rfl: 5 .  finasteride (PROSCAR) 5 MG tablet, Take 5 mg by mouth daily., Disp: , Rfl:  .  fluticasone (FLONASE) 50 MCG/ACT nasal spray, Place 2 sprays into both nostrils daily., Disp: , Rfl:  .  fluticasone furoate-vilanterol (BREO ELLIPTA) 100-25 MCG/INH AEPB, Inhale 1 puff into the lungs daily., Disp: 30 each, Rfl: 5 .  gabapentin (NEURONTIN) 300 MG capsule, Take 1 capsule (300 mg total) by mouth 2 (two) times daily., Disp: 30 capsule, Rfl: 0 .  glucose blood (ACCU-CHEK AVIVA) test strip, Use as instructed,  Disp: 100 each, Rfl: 12 .  Incontinence Supply Disposable (PADSORBER BED PAN LINERS) MISC, 1 each by Does not apply route daily., Disp: 50 each, Rfl: 0 .  insulin glargine (LANTUS) 100 UNIT/ML injection, Inject 20 Units into the skin at bedtime., Disp: , Rfl:  .  latanoprost (XALATAN) 0.005 % ophthalmic solution, Place 1 drop into both eyes at bedtime., Disp: , Rfl:  .  levETIRAcetam (KEPPRA) 500 MG tablet, Take 2 tablets (1,000 mg total) by mouth 2 (two) times daily. (Patient taking differently: Take 2,000 mg by mouth 2 (two) times daily. ), Disp: , Rfl:  .  lisinopril (PRINIVIL,ZESTRIL) 10 MG tablet, Take 1 tablet (10 mg total) by mouth daily., Disp: 90 tablet, Rfl: 3 .  meloxicam (MOBIC) 15 MG tablet, Take 15 mg by  mouth daily as needed for pain., Disp: , Rfl:  .  metFORMIN (GLUCOPHAGE) 1000 MG tablet, Take 1 tablet (1,000 mg total) by mouth 2 (two) times daily with a meal., Disp: 60 tablet, Rfl: 0 .  Misc. Devices ([address]) MISC, 1 each by Does not apply route daily as needed., Disp: 1 each, Rfl: 0 .  Misc. Devices (QUAD CANE) MISC, 1 each by Does not apply route as needed., Disp: 1 each, Rfl: 0 .  pantoprazole (PROTONIX) 40 MG tablet, Take 1 tablet (40 mg total) by mouth daily., Disp: 30 tablet, Rfl: 5 .  polyethylene glycol (MIRALAX / GLYCOLAX) packet, Take 17 g by mouth daily., Disp: 14 each, Rfl: 0 .  protein supplement shake (PREMIER PROTEIN) LIQD, Take 325 mLs (11 oz total) by mouth 2 (two) times daily between meals., Disp: 7 Can, Rfl: 0 .  tamsulosin (FLOMAX) 0.4 MG CAPS capsule, Take 1 capsule (0.4 mg total) by mouth daily after supper., Disp: 30 capsule, Rfl: 0 .  Tiotropium Bromide Monohydrate (SPIRIVA RESPIMAT) 1.25 MCG/ACT AERS, Inhale 2 puffs into the lungs daily., Disp: , Rfl:  .  venlafaxine XR (EFFEXOR-XR) 150 MG 24 hr capsule, Take 150 mg by mouth daily with breakfast., Disp: , Rfl:  .  vitamin B-12 (CYANOCOBALAMIN) 500 MCG tablet, Take 500 mcg by mouth daily.,  Disp: , Rfl:   Exam: Current vital signs: BP (!) 146/94   Pulse 65   Temp 98 F (36.7 C) (Oral)   Resp 15   SpO2 92%  Vital signs in last 24 hours: Temp:  [98 F (36.7 C)] 98 F (36.7 C) (06/28 1930) Pulse Rate:  [57-76] 65 (06/29 0000) Resp:  [14-22] 15 (06/29 0000) BP: (126-153)/(89-96) 146/94 (06/29 0000) SpO2:  [92 %-99 %] 92 % (06/29 0000) GENERAL: Awake, alert in NAD HEENT: - Normocephalic and atraumatic, dry mm, no LN++, no Thyromegally LUNGS - Clear to auscultation bilaterally with no wheezes CV - S1S2 RRR, no m/r/g, equal pulses bilaterally. ABDOMEN - Soft, nontender, nondistended with normoactive BS Ext: warm, well perfused, intact peripheral pulses, no edema NEURO:  Mental Status: AA&Ox2 Language: speech is not dysarthric.  Naming, repetition, fluency, and comprehension intact.  Poor attention concentration. Cranial Nerves: PERRL. EOMI, visual fields full, no facial asymmetry, facial sensation intact, hearing intact, tongue/uvula/soft palate midline, normal sternocleidomastoid and trapezius muscle strength. No evidence of tongue atrophy or fibrillations Motor: 4/5 RUE with vertical drift. 4+/5 RLE with no drift. 4+/5 LUE and LLE with no drift. Tone: is normal and bulk is normal Sensation- Intact to light touch bilaterally Coordination: FTN intact bilaterally Gait- deferred NIHSS 1a Level of Conscious.: 0 1b LOC Questions: 1 1c LOC Commands: 0 2 Best Gaze: 0 3 Visual: 0 4 Facial Palsy: 0 5a Motor Arm - left: 0 5b Motor Arm - Right: 1 6a Motor Leg - Left: 0 6b Motor Leg - Right: 0 7 Limb Ataxia: 0 8 Sensory: 0 9 Best Language: 0 10 Dysarthria: 0 11 Extinct. and Inatten.: 0 TOTAL: 2  Labs I have reviewed labs in epic and the results pertinent to this consultation are: CBC    Component Value Date/Time   WBC 7.2 06/11/2018 1938   RBC 5.25 06/11/2018 1938   HGB 14.1 06/11/2018 1938   HCT 46.5 06/11/2018 1938   HCT TEST REQUEST RECEIVED WITHOUT  APPROPRIATE SPECIMEN 05/20/2017 0225   PLT 194 06/11/2018 1938   MCV 88.6 06/11/2018 1938   MCH 26.9 06/11/2018 1938   MCHC 30.3  06/11/2018 1938   RDW 14.7 06/11/2018 1938   LYMPHSABS 1.7 04/27/2018 0808   MONOABS 0.6 04/27/2018 0808   EOSABS 0.1 04/27/2018 0808   BASOSABS 0.0 04/27/2018 0808  CMP     Component Value Date/Time   NA 141 06/11/2018 1938   K 4.1 06/11/2018 1938   CL 106 06/11/2018 1938   CO2 22 06/11/2018 1938   GLUCOSE 117 (H) 06/11/2018 1938   BUN 18 06/11/2018 1938   CREATININE 0.78 06/11/2018 1938   CREATININE 0.85 01/30/2017 1034   CALCIUM 9.5 06/11/2018 1938   PROT 7.7 04/24/2018 2109   ALBUMIN 4.0 04/24/2018 2109   AST 22 04/24/2018 2109   ALT 33 04/24/2018 2109   ALKPHOS 94 04/24/2018 2109   BILITOT 1.1 04/24/2018 2109   GFRNONAA >60 06/11/2018 1938   GFRAA >60 06/11/2018 1938   Lipid Panel     Component Value Date/Time   CHOL 134 08/29/2016 0759   TRIG 53 08/29/2016 0759   HDL 34 (L) 08/29/2016 0759   CHOLHDL 3.9 08/29/2016 0759   VLDL 11 08/29/2016 0759   LDLCALC 89 08/29/2016 0759   Imaging I have reviewed the images obtained: CT-scan of the brain-no acute changes- left BG hypodensity, stable from prior scans CT angios head and neck does not show any large vessel occlusion.  No bony abnormality in the C-spine. MRI Can not be obtained due to pacer.  Assessment:  62 year old with past history of hypertension, hyperlipidemia, prior stroke with documented left-sided weakness, essential tremors, substance abuse, pacemaker, documented dementia, syncope and presyncope in the past, who is pending long-term video EEG at [facility] in the coming week, brought in for dizziness and right-sided weakness. On exam he does have some right upper extremity weakness.  No frank weakness in the right lower extremity.  No facial involvement. I do not have a clear examination of his symptoms.   His seizure history also is all presumptive meaning that have  been no documented seizures in our charts and all the EEGs done thus far have been clearly negative for any electrographic abnormalities. At this time, given his risk factors, this could very well be a small vessel lacunar infarct in the brainstem or subcortical.  I doubt that this is a prolonged postictal phenomenology. I spoke to the patient's wife in detail bedside.  She is very hesitant to take him back home because she feels unsafe taking care of him by herself with his right arm weakness.  I feel if he has a substantial size stroke, repeat imaging in 24 hours should be able to reveal that.  I do not see a need for repeat EEG at this time. Other differentials include orthostatic hypotension versus syncope.  Impression: --Evaluate for acute ischemic stroke --Evaluate for seizure-work-up in progress from his outpatient doctors with scheduled for long-term video EEG at [facility] next week. --Evaluate for orthostatic hypotension  Recommendations: -Repeat head CT tomorrow -Continue Keppra 1500 BID as per home meds from [place] -Frequent neurochecks -Orthostatic vitals -Has LTM EEG scheduled at [facility] for the coming week, do not see the need for EEG at this time. -Maintain seizure precautions -Decrease sedating medications-we will defer to his primary care physician. -No need to order stroke work-up unless repeat head CT shows evidence of new or evolving stroke. -Interrogate pacemaker for any evidence of arrhythmias -PT OT  We will follow with you after repeat imaging.  -- Amie Portland, MD [facility] Pager: [phone] If 7pm to 7am, please call on  call as listed on AMION.
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
## 2018-06-12 NOTE — Progress Notes (Signed)
Pt admitted from ED with stroke like symptoms, alert and oriented, c/o of  back pain, settled in bed with family at bedside, tele monitor put and verified on pt, call light within pt's reach and safety concern addressed, was however reassured and will continue to monitor. [place], [place] Efe

## 2018-06-13 ENCOUNTER — Observation Stay (HOSPITAL_BASED_OUTPATIENT_CLINIC_OR_DEPARTMENT_OTHER): Payer: Medicaid Other

## 2018-06-13 DIAGNOSIS — R531 Weakness: Secondary | ICD-10-CM | POA: Diagnosis not present

## 2018-06-13 DIAGNOSIS — R9431 Abnormal electrocardiogram [ECG] [EKG]: Secondary | ICD-10-CM | POA: Diagnosis not present

## 2018-06-13 LAB — ECHOCARDIOGRAM COMPLETE
Height: 74 in
WEIGHTICAEL: 3298.08 [oz_av]

## 2018-06-13 LAB — GLUCOSE, CAPILLARY
Glucose-Capillary: 109 mg/dL — ABNORMAL HIGH (ref 70–99)
Glucose-Capillary: 151 mg/dL — ABNORMAL HIGH (ref 70–99)

## 2018-06-13 MED ORDER — ATORVASTATIN CALCIUM 80 MG PO TABS: 80.0000 mg | ORAL_TABLET | Freq: Every day | ORAL | 1 refills | Status: DC

## 2018-06-13 NOTE — Progress Notes (Signed)
*  PRELIMINARY RESULTS* Echocardiogram 2D Echocardiogram has been performed.  Leavy Cella 06/13/2018, 11:59 AM

## 2018-06-13 NOTE — Discharge Summary (Signed)
[facility]  Physician Discharge Summary   Patient ID: Wesley [name] MRN: [id number] DOB/AGE: [birth year] 62 y.o.  Admit date: 06/11/2018 Discharge date: 06/13/2018  PCP: Boykin Nearing, MD  DISCHARGE DIAGNOSES:  Right-sided weakness, unclear etiology History of stroke History of seizures  RECOMMENDATIONS FOR OUTPATIENT FOLLOW UP: 1. Patient to keep appointment at [facility] for EEG monitoring 2. Follow-up with primary care provider 3. Home health   DISCHARGE CONDITION: fair  Diet recommendation: As before  [facility] Weights   06/12/18 0537  Weight: 93.5 kg (206 lb 2.1 oz)    INITIAL HISTORY: 62 year old African-American male with a past medical history of sick sinus syndrome status post pacemaker, previous history of stroke with left-sided weakness, history of seizures on Keppra, diabetes mellitus on Lantus presented with complains of right-sided weakness.  Patient CT scan was unremarkable.  CT angiogram of head and neck did not show any large vessel occlusion.  Neurology was consulted.  Patient was hospitalized for further management.  Consultants: Neurology  Procedures:  Transthoracic echocardiogram Study Conclusions  - Left ventricle: The cavity size was normal. Wall thickness was   increased in a pattern of moderate LVH. Systolic function was   normal. The estimated ejection fraction was in the range of 50%   to 55%. Wall motion was normal; there were no regional wall   motion abnormalities. Doppler parameters are consistent with   abnormal left ventricular relaxation (grade 1 diastolic   dysfunction).  Impressions:  - Normal LV systolic function; moderate LVH; mild diastolic   dysfunction.   HOSPITAL COURSE:   Right-sided weakness Concern was for acute stroke.  Due to presence of pacemaker patient could not undergo MRI.  He had 2 CT scans both of which did not show any acute findings.  Patient was seen by neurology.  Etiology for his right upper  extremity weakness is not entirely clear.  He could have had a small stroke.  Does not change management.  Continue aspirin.  Statin dose increased due to LDL of 126.  Seen by PT and OT.  Home health will be ordered.    Abnormal EKG Subtle ST segment elevation noted in inferior leads.  Old EKG reviewed and showed similar findings but perhaps not as pronounced.  Patient denies any chest pain.  Troponins are negative.  Echocardiogram shows normal systolic function without any wall motion abnormalities.  No further work-up is necessary at this time.    Diabetes mellitus type 2 Continue home medications  Hyperlipidemia LDL noted to be 126.  Noted to be on statin.  We will increase dose of his Lipitor.  History of essential hypertension Stable.  Continue home medications.  History of seizures Patient apparently has also had episodes of dizziness and near syncope for which he has had extensive outpatient work-up.  He is followed at the [place].  He has been referred to [facility] for further work-up including video EEG which is scheduled for next week.  Per neurology no need to do EEG at this time.  No seizure activity noted.  Continue Keppra.  History of COPD Not actively wheezing  History of sleep apnea CPAP  BPH Continue Flomax.  Overall stable.  Work-up has been unremarkable.  Okay for discharge home today.    PERTINENT LABS:  The results of significant diagnostics from this hospitalization (including imaging, microbiology, ancillary and laboratory) are listed below for reference.      Labs: Basic Metabolic Panel: Recent Labs  Lab 06/11/18 1938 06/12/18 0449  NA 141  --  K 4.1  --   CL 106  --   CO2 22  --   GLUCOSE 117*  --   BUN 18  --   CREATININE 0.78 0.70  CALCIUM 9.5  --    CBC: Recent Labs  Lab 06/11/18 1938 06/12/18 0449  WBC 7.2 6.6  HGB 14.1 14.1  HCT 46.5 46.0  MCV 88.6 88.0  PLT 194 199   Cardiac Enzymes: Recent Labs  Lab 06/11/18 1938  06/12/18 0644 06/12/18 1226 06/12/18 1841  TROPONINI <0.03 <0.03 <0.03 <0.03   CBG: Recent Labs  Lab 06/12/18 1203 06/12/18 1649 06/12/18 2211 06/13/18 0620 06/13/18 1132  GLUCAP 106* 129* 112* 109* 151*     IMAGING STUDIES Ct Angio Head W Or Wo Contrast  Result Date: 06/12/2018 CLINICAL DATA:  Left-sided weakness. EXAM: CT ANGIOGRAPHY HEAD AND NECK TECHNIQUE: Multidetector CT imaging of the head and neck was performed using the standard protocol during bolus administration of intravenous contrast. Multiplanar CT image reconstructions and MIPs were obtained to evaluate the vascular anatomy. Carotid stenosis measurements (when applicable) are obtained utilizing NASCET criteria, using the distal internal carotid diameter as the denominator. CONTRAST:  40m ISOVUE-370 IOPAMIDOL (ISOVUE-370) INJECTION 76% COMPARISON:  Head CT 06/11/2018 FINDINGS: CTA NECK FINDINGS AORTIC ARCH: There is mild calcific atherosclerosis of the aortic arch. There is no aneurysm, dissection or hemodynamically significant stenosis of the visualized ascending aorta and aortic arch. Conventional 3 vessel aortic branching pattern. The visualized proximal subclavian arteries are widely patent. RIGHT CAROTID SYSTEM: --Common carotid artery: Widely patent origin without common carotid artery dissection or aneurysm. --Internal carotid artery: No dissection, occlusion or aneurysm. No hemodynamically significant stenosis. --External carotid artery: No acute abnormality. LEFT CAROTID SYSTEM: --Common carotid artery: Widely patent origin without common carotid artery dissection or aneurysm. --Internal carotid artery:No dissection, occlusion or aneurysm. No hemodynamically significant stenosis. --External carotid artery: No acute abnormality. VERTEBRAL ARTERIES: Left dominant configuration. Both origins are normal. No dissection, occlusion or flow-limiting stenosis to the vertebrobasilar confluence. SKELETON: There is no bony spinal  canal stenosis. No lytic or blastic lesion. OTHER NECK: Normal pharynx, larynx and major salivary glands. No cervical lymphadenopathy. Unremarkable thyroid gland. UPPER CHEST: Biapical bullae, right larger than left. CTA HEAD FINDINGS ANTERIOR CIRCULATION: --Intracranial internal carotid arteries: Atherosclerotic calcification of the internal carotid arteries at the skull base without hemodynamically significant stenosis. --Anterior cerebral arteries: Normal. Both A1 segments are present. Patent anterior communicating artery. --Middle cerebral arteries: Normal. --Posterior communicating arteries: Absent bilaterally. 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 --Posterior cerebral arteries: Normal. --Superior cerebellar arteries: Normal. --Inferior cerebellar arteries: Normal anterior and posterior inferior cerebellar arteries. VENOUS SINUSES: As permitted by contrast timing, patent. ANATOMIC VARIANTS: None DELAYED PHASE: No parenchymal contrast enhancement. Review of the MIP images confirms the above findings. IMPRESSION: 1. No emergent large vessel occlusion or hemodynamically significant stenosis. 2. Biapical pulmonary bullae. Aortic Atherosclerosis (ICD10-I70.0) and Emphysema (ICD10-J43.9). Electronically Signed   By: KUlyses JarredM.D.   On: 06/12/2018 01:05   Ct Head Wo Contrast  Result Date: 06/12/2018 CLINICAL DATA:  TIA.  No new or worsening symptoms EXAM: CT HEAD WITHOUT CONTRAST TECHNIQUE: Contiguous axial images were obtained from the base of the skull through the vertex without intravenous contrast. COMPARISON:  Head CT from yesterday FINDINGS: Brain: Patchy low-density in the cerebral white matter consistent with chronic small vessel ischemia. Prominent extra-axial CSF density without mass effect to imply collection. No evidence of gray matter infarct. No hemorrhage, hydrocephalus, or masslike finding.  Vascular: Atherosclerotic calcification Skull: Negative  Sinuses/Orbits: Negative IMPRESSION: 1. No acute finding.  No change from yesterday to suggest infarct. 2. Chronic small vessel ischemia in the cerebral white matter. Electronically Signed   By: Monte Fantasia M.D.   On: 06/12/2018 11:10   Ct Head Wo Contrast  Result Date: 06/11/2018 CLINICAL DATA:  Worsening weakness and dizziness. History of CVA. No reported injury. EXAM: CT HEAD WITHOUT CONTRAST TECHNIQUE: Contiguous axial images were obtained from the base of the skull through the vertex without intravenous contrast. COMPARISON:  04/24/2018 head CT. FINDINGS: Brain: No evidence of parenchymal hemorrhage or extra-axial fluid collection. No mass lesion, mass effect, or midline shift. No CT evidence of acute infarction. Nonspecific mild to moderate subcortical and periventricular white matter hypodensity, most in keeping with chronic small vessel ischemic change. Generalized cerebral volume loss. No ventriculomegaly. Vascular: No acute abnormality. Skull: No evidence of calvarial fracture. Sinuses/Orbits: Mild mucoperiosteal thickening in the ethmoidal air cells. No fluid levels. Other:  The mastoid air cells are unopacified. IMPRESSION: 1.  No evidence of acute intracranial abnormality. 2. Generalized cerebral volume loss and mild-to-moderate chronic small vessel ischemic changes in the cerebral white matter. 3. Mild paranasal sinusitis, chronic appearing. Electronically Signed   By: Ilona Sorrel M.D.   On: 06/11/2018 21:37   Ct Angio Neck W And/or Wo Contrast  Result Date: 06/12/2018 CLINICAL DATA:  Left-sided weakness. EXAM: CT ANGIOGRAPHY HEAD AND NECK TECHNIQUE: Multidetector CT imaging of the head and neck was performed using the standard protocol during bolus administration of intravenous contrast. Multiplanar CT image reconstructions and MIPs were obtained to evaluate the vascular anatomy. Carotid stenosis measurements (when applicable) are obtained utilizing NASCET criteria, using the distal  internal carotid diameter as the denominator. CONTRAST:  71m ISOVUE-370 IOPAMIDOL (ISOVUE-370) INJECTION 76% COMPARISON:  Head CT 06/11/2018 FINDINGS: CTA NECK FINDINGS AORTIC ARCH: There is mild calcific atherosclerosis of the aortic arch. There is no aneurysm, dissection or hemodynamically significant stenosis of the visualized ascending aorta and aortic arch. Conventional 3 vessel aortic branching pattern. The visualized proximal subclavian arteries are widely patent. RIGHT CAROTID SYSTEM: --Common carotid artery: Widely patent origin without common carotid artery dissection or aneurysm. --Internal carotid artery: No dissection, occlusion or aneurysm. No hemodynamically significant stenosis. --External carotid artery: No acute abnormality. LEFT CAROTID SYSTEM: --Common carotid artery: Widely patent origin without common carotid artery dissection or aneurysm. --Internal carotid artery:No dissection, occlusion or aneurysm. No hemodynamically significant stenosis. --External carotid artery: No acute abnormality. VERTEBRAL ARTERIES: Left dominant configuration. Both origins are normal. No dissection, occlusion or flow-limiting stenosis to the vertebrobasilar confluence. SKELETON: There is no bony spinal canal stenosis. No lytic or blastic lesion. OTHER NECK: Normal pharynx, larynx and major salivary glands. No cervical lymphadenopathy. Unremarkable thyroid gland. UPPER CHEST: Biapical bullae, right larger than left. CTA HEAD FINDINGS ANTERIOR CIRCULATION: --Intracranial internal carotid arteries: Atherosclerotic calcification of the internal carotid arteries at the skull base without hemodynamically significant stenosis. --Anterior cerebral arteries: Normal. Both A1 segments are present. Patent anterior communicating artery. --Middle cerebral arteries: Normal. --Posterior communicating arteries: Absent bilaterally. 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 --Posterior cerebral  arteries: Normal. --Superior cerebellar arteries: Normal. --Inferior cerebellar arteries: Normal anterior and posterior inferior cerebellar arteries. VENOUS SINUSES: As permitted by contrast timing, patent. ANATOMIC VARIANTS: None DELAYED PHASE: No parenchymal contrast enhancement. Review of the MIP images confirms the above findings. IMPRESSION: 1. No emergent large vessel occlusion or hemodynamically significant stenosis. 2. Biapical pulmonary bullae. Aortic Atherosclerosis (ICD10-I70.0) and  Emphysema (ICD10-J43.9). Electronically Signed   By: Ulyses Jarred M.D.   On: 06/12/2018 01:05    DISCHARGE EXAMINATION: Vitals:   06/13/18 0502 06/13/18 0742 06/13/18 1035 06/13/18 1144  BP: 114/77 121/89 124/77 114/70  Pulse: 60 62  63  Resp: _0 Temp: 98 F (36.7 C) 97.7 F (36.5 C)  98.5 F (36.9 C)  TempSrc: Oral Oral  Oral  SpO2: 95% 96%  95%  Weight:      Height:       General appearance: alert, cooperative, appears stated age and no distress Resp: clear to auscultation bilaterally Cardio: regular rate and rhythm, S1, S2 normal, no murmur, click, rub or gallop GI: soft, non-tender; bowel sounds normal; no masses,  no organomegaly  DISPOSITION: Home  Discharge Instructions    Call MD for:  extreme fatigue   Complete by:  As directed    Call MD for:  persistant dizziness or light-headedness   Complete by:  As directed    Call MD for:  persistant nausea and vomiting   Complete by:  As directed    Call MD for:  severe uncontrolled pain   Complete by:  As directed    Call MD for:  temperature >100.4   Complete by:  As directed    Discharge instructions   Complete by:  As directed    Please keep your appointment at [facility] for the EEG.  Take your medications as before.  You were cared for by a hospitalist during your hospital stay. If you have any questions about your discharge medications or the care you received while you were in the hospital after you are discharged, you can call  the unit and asked to speak with the hospitalist on call if the hospitalist that took care of you is not available. Once you are discharged, your primary care physician will handle any further medical issues. Please note that NO REFILLS for any discharge medications will be authorized once you are discharged, as it is imperative that you return to your primary care physician (or establish a relationship with a primary care physician if you do not have one) for your aftercare needs so that they can reassess your need for medications and monitor your lab values. If you do not have a primary care physician, you can call [phone] for a physician referral.   Increase activity slowly   Complete by:  As directed         Allergies as of 06/13/2018      Reactions   Penicillins Swelling   "General swelling" Has patient had a PCN reaction causing immediate rash, facial/tongue/throat swelling, SOB or lightheadedness with hypotension: Yes Has patient had a PCN reaction causing severe rash involving mucus membranes or skin necrosis: Unk Has patient had a PCN reaction that required hospitalization: Yes Has patient had a PCN reaction occurring within the last 10 years: No If all of the above answers are "NO", then may proceed with Cephalosporin use.   Levaquin [levofloxacin In D5w] Hives      Medication List    STOP taking these medications   escitalopram 20 MG tablet Commonly known as:  LEXAPRO   fluticasone furoate-vilanterol 100-25 MCG/INH Aepb Commonly known as:  BREO ELLIPTA     TAKE these medications   ACCU-CHEK AVIVA PLUS w/Device Kit 1 Device by Does not apply route 4 (four) times daily.   albuterol 108 (90 Base) MCG/ACT inhaler Commonly known as:  PROVENTIL HFA;VENTOLIN HFA Inhale  2 puffs into the lungs every 6 (six) hours as needed for wheezing or shortness of breath.   aspirin 325 MG tablet Take 325 mg by mouth daily.   atorvastatin 80 MG tablet Commonly known as:  LIPITOR Take 1  tablet (80 mg total) by mouth daily at 6 PM. What changed:    medication strength  how much to take   Brimonidine-Dorzolamide 0.15-2 % Soln Place 1 drop into both eyes 2 (two) times daily.   carboxymethylcellulose 0.5 % Soln Commonly known as:  REFRESH PLUS Place 1 drop into both eyes at bedtime as needed (for irritation).   cetirizine 10 MG tablet Commonly known as:  ZYRTEC Take 1 tablet (10 mg total) by mouth daily.   diclofenac sodium 1 % Gel Commonly known as:  VOLTAREN Apply 4 g topically 4 (four) times daily. TO AFFECTED AREAS   ENSURE PLUS Liqd Take 237 mLs by mouth 2 (two) times daily between meals.   finasteride 5 MG tablet Commonly known as:  PROSCAR Take 5 mg by mouth daily.   fluticasone 50 MCG/ACT nasal spray Commonly known as:  FLONASE Place 2 sprays into both nostrils daily.   gabapentin 300 MG capsule Commonly known as:  NEURONTIN Take 1 capsule (300 mg total) by mouth 2 (two) times daily. What changed:  when to take this   glucose blood test strip Commonly known as:  ACCU-CHEK AVIVA Use as instructed   HUGO ROLLING WALKER BASIC Misc 1 each by Does not apply route daily as needed.   [employer] Misc 1 each by Does not apply route as needed.   insulin glargine 100 UNIT/ML injection Commonly known as:  LANTUS Inject 30 Units into the skin at bedtime.   latanoprost 0.005 % ophthalmic solution Commonly known as:  XALATAN Place 1 drop into both eyes at bedtime.   levETIRAcetam 500 MG tablet Commonly known as:  KEPPRA Take 2 tablets (1,000 mg total) by mouth 2 (two) times daily. What changed:  how much to take   lisinopril 10 MG tablet Commonly known as:  PRINIVIL,ZESTRIL Take 1 tablet (10 mg total) by mouth daily.   meloxicam 15 MG tablet Commonly known as:  MOBIC Take 15 mg by mouth daily as needed for pain.   metFORMIN 1000 MG tablet Commonly known as:  GLUCOPHAGE Take 1 tablet (1,000 mg total) by mouth 2 (two) times daily with a  meal.   PADSORBER BED PAN LINERS Misc 1 each by Does not apply route daily.   pantoprazole 40 MG tablet Commonly known as:  PROTONIX Take 1 tablet (40 mg total) by mouth daily. What changed:  when to take this   polyethylene glycol packet Commonly known as:  MIRALAX / GLYCOLAX Take 17 g by mouth daily. What changed:    when to take this  reasons to take this   protein supplement shake Liqd Commonly known as:  PREMIER PROTEIN Take 325 mLs (11 oz total) by mouth 2 (two) times daily between meals.   STIOLTO RESPIMAT 2.5-2.5 MCG/ACT Aers Generic drug:  Tiotropium Bromide-Olodaterol Inhale 2 puffs into the lungs daily.   tamsulosin 0.4 MG Caps capsule Commonly known as:  FLOMAX Take 1 capsule (0.4 mg total) by mouth daily after supper.   UNKNOWN TO PATIENT Unnamed nebulizer solution: Nebulize 1 vial every 4-6 hours as needed for shortness of breath   venlafaxine XR 150 MG 24 hr capsule Commonly known as:  EFFEXOR-XR Take 150 mg by mouth daily with breakfast.  vitamin B-12 500 MCG tablet Commonly known as:  CYANOCOBALAMIN Take 500 mcg by mouth daily.   Vitamin D3 2000 units Tabs Take 2,000 Units by mouth daily.   Wrist Splint/Elastic Left Lg Misc 1 each by Does not apply route daily.        Follow-up Information    Boykin Nearing, MD. Schedule an appointment as soon as possible for a visit in 1 week(s).   Specialty:  Family Medicine Contact information: [place] [address] [phone]           TOTAL DISCHARGE TIME: 67 minutes  [facility] Pager [phone]  06/13/2018, 1:55 PM

## 2018-06-13 NOTE — Care Management Note (Signed)
Case Management Note  Patient Details  Name: WAYDE GOPAUL MRN: [id number] Date of Birth: 1956-11-26  Subjective/Objective:     Pt presented with right sided weakness/dizziness.  Pt active with [facility] PT, OT, RN and will resume upon d/c.                 Action/Plan: [facility] liaison, Jermaine, advised of patient's d/c today.  Order placed to resume [facility] services.   Expected Discharge Date:  06/13/18               Expected Discharge Plan:  [place]  In-House Referral:  NA  Discharge planning Services  CM Consult  Post Acute Care Choice:  Resumption of Svcs/PTA Provider Choice offered to:  Patient  DME Arranged:  N/A DME Agency:  NA  HH Arranged:  PT, OT, RN [place] Agency:  Reform  Status of Service:  Completed, signed off  If discussed at [place] of Stay Meetings, dates discussed:    Additional Comments:  Claudie Leach, RN 06/13/2018, 1:59 PM

## 2018-06-13 NOTE — Progress Notes (Signed)
Patient discharged to home with family. All discharge instructions gone through with patient and spouse. No questions at this time.

## 2018-07-29 ENCOUNTER — Telehealth (HOSPITAL_COMMUNITY): Payer: Self-pay

## 2018-07-29 NOTE — Telephone Encounter (Signed)
Attempted to call patient in regards to Pulmonary Rehab - LM on VM °

## 2018-08-03 ENCOUNTER — Telehealth (HOSPITAL_COMMUNITY): Payer: Self-pay

## 2018-08-03 NOTE — Telephone Encounter (Signed)
Called and spoke with patient in regards to Pulmonary Rehab - Scheduled orientation on 08/23/18 at 1:30pm. Patient will attend the 10:30am exc class. Mailed packet.

## 2018-08-23 ENCOUNTER — Encounter (HOSPITAL_COMMUNITY): Payer: Self-pay

## 2018-08-23 ENCOUNTER — Ambulatory Visit (HOSPITAL_COMMUNITY): Payer: Non-veteran care

## 2018-08-23 ENCOUNTER — Encounter (HOSPITAL_COMMUNITY)
Admission: RE | Admit: 2018-08-23 | Discharge: 2018-08-23 | Disposition: A | Discharge status: Home / Self Care | Payer: No Typology Code available for payment source | Source: Ambulatory Visit | Attending: Pulmonary Disease | Admitting: Pulmonary Disease

## 2018-08-23 VITALS — BP 119/69 | HR 72 | Temp 97.8°F | Resp 18 | Ht 73.25 in | Wt 209.4 lb

## 2018-08-23 DIAGNOSIS — Z794 Long term (current) use of insulin: Secondary | ICD-10-CM | POA: Diagnosis not present

## 2018-08-23 DIAGNOSIS — Z79899 Other long term (current) drug therapy: Secondary | ICD-10-CM | POA: Diagnosis not present

## 2018-08-23 DIAGNOSIS — F17210 Nicotine dependence, cigarettes, uncomplicated: Secondary | ICD-10-CM | POA: Diagnosis not present

## 2018-08-23 DIAGNOSIS — Z950 Presence of cardiac pacemaker: Secondary | ICD-10-CM | POA: Insufficient documentation

## 2018-08-23 DIAGNOSIS — G250 Essential tremor: Secondary | ICD-10-CM | POA: Diagnosis not present

## 2018-08-23 DIAGNOSIS — I10 Essential (primary) hypertension: Secondary | ICD-10-CM | POA: Insufficient documentation

## 2018-08-23 DIAGNOSIS — R531 Weakness: Secondary | ICD-10-CM | POA: Diagnosis not present

## 2018-08-23 DIAGNOSIS — N400 Enlarged prostate without lower urinary tract symptoms: Secondary | ICD-10-CM | POA: Insufficient documentation

## 2018-08-23 DIAGNOSIS — J449 Chronic obstructive pulmonary disease, unspecified: Secondary | ICD-10-CM | POA: Diagnosis present

## 2018-08-23 DIAGNOSIS — Z7951 Long term (current) use of inhaled steroids: Secondary | ICD-10-CM | POA: Insufficient documentation

## 2018-08-23 DIAGNOSIS — E785 Hyperlipidemia, unspecified: Secondary | ICD-10-CM | POA: Diagnosis not present

## 2018-08-23 DIAGNOSIS — M179 Osteoarthritis of knee, unspecified: Secondary | ICD-10-CM | POA: Insufficient documentation

## 2018-08-23 DIAGNOSIS — Z8673 Personal history of transient ischemic attack (TIA), and cerebral infarction without residual deficits: Secondary | ICD-10-CM | POA: Diagnosis not present

## 2018-08-23 DIAGNOSIS — I252 Old myocardial infarction: Secondary | ICD-10-CM | POA: Insufficient documentation

## 2018-08-23 NOTE — Progress Notes (Signed)
Wesley [name] 62 y.o. male Pulmonary Rehab Orientation Note [name] referred to pulmonary rehab by his pulmonary MD at [facility] - Dr. Gwenette Greet for COPD.  Pt received [facility] authorization [id number] through 12/4 to participate in pulmonary rehab.  Pt  arrived today in Cardiac and Pulmonary Rehab for orientation to Pulmonary Rehab. He was transported from [employer] via wheel chair. He does not carry portable oxygen. Per pt, he uses oxygen never. Color good, skin warm and dry. Patient is oriented to time and place. Patient's medical history, psychosocial health, and medications reviewed. Psychosocial assessment reveals pt lives with their spouse and son. Pt is currently retired Company secretary.  Pt hobbies include working with his hands and cooking. Pt reports his stress level is moderate. Areas of stress/anxiety include Health. Pt unable to perform activities around the home without taking multiple rest breaks and this bothers him.  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  Pt exhibits signs of depression. Signs of depression include agitation, anxiety, guilt, helplessness and hopelessness and difficulty falling asleep, difficulty maintaining sleep and early morning awakenings. Some of this he reports is due to his training in the [place], he was taught to be a light sleeper and to get by with a "cat nap".  Pt wears CPAP at night but it is ill fitted and this causes him not to sleep well also. PHQ2/9 score 3/13. Pt shows fair  coping skills with somewhat positive outlook.  Pt is hopeful that participating in pulmonary rehab will make a difference in how he feels . [name] wasoffered emotional support and reassurance. Pt takes Effexor and receives counseling for PTSD. Will continue to monitor and evaluate progress toward psychosocial goal(s) of Improved outlook on his future and the development of positive and healthy coping skill.   Physical assessment reveals heart rate is normal, breath sounds diminished. Grip strength equal, strong. Distal pulses palpable with no swelling.  Pt has wrist braces for carpal tunnel bilateral. Patient reports he does take medications as prescribed, pt is very aware of the importance of taking his Keppra consistently. Patient states he follows a Regular diet. The patient has been trying to gain weight by drinking 2 cans of ensure and 1 protein shake a day.. Patient's weight will be monitored closely. Demonstration and practice of PLB using pulse oximeter. Patient able to return demonstration satisfactorily. Safety and hand hygiene in the exercise area reviewed with patient. Patient voices understanding of the information reviewed. Department expectations discussed with patient and achievable goals were set. The patient shows enthusiasm about attending the program and we look forward to working with this nice veteran.  The patient is scheduled for a 6 min walk test on 9/12  and to begin exercise on 9/19 at 1030. 45 minutes was spent on a variety of activities such as assessment of the patient, obtaining baseline data including height, weight, BMI, and grip strength, verifying medical history, allergies, and current medications, and teaching patient strategies for performing tasks with less respiratory effort with emphasis on pursed lip breathing. [id number] Cherre Huger, BSN Cardiac and Training and development officer

## 2018-08-26 ENCOUNTER — Encounter (HOSPITAL_COMMUNITY)
Admission: RE | Admit: 2018-08-26 | Discharge: 2018-08-26 | Disposition: A | Discharge status: Home / Self Care | Payer: No Typology Code available for payment source | Source: Ambulatory Visit | Attending: Pulmonary Disease | Admitting: Pulmonary Disease

## 2018-08-26 DIAGNOSIS — J449 Chronic obstructive pulmonary disease, unspecified: Secondary | ICD-10-CM

## 2018-08-26 NOTE — Progress Notes (Signed)
Pulmonary Individual Treatment Plan  Patient Details  Name: Wesley [name] MRN: [id number] Date of Birth: July 01, 1956 Referring Provider:     Pulmonary Rehab Walk Test from 08/26/2018 in [facility]  Referring Provider  Dr. Nelda Marseille      Initial Encounter Date:    Pulmonary Rehab Walk Test from 08/26/2018 in [facility]  Date  08/26/18      Visit Diagnosis: Chronic obstructive pulmonary disease, unspecified COPD type ([place])  Patient's Home Medications on Admission:   Current Outpatient Medications:  .  albuterol (PROVENTIL HFA;VENTOLIN HFA) 108 (90 Base) MCG/ACT inhaler, Inhale 2 puffs into the lungs every 6 (six) hours as needed for wheezing or shortness of breath., Disp: 1 Inhaler, Rfl: 2 .  aspirin 325 MG tablet, Take 325 mg by mouth daily., Disp: , Rfl:  .  atorvastatin (LIPITOR) 80 MG tablet, Take 1 tablet (80 mg total) by mouth daily at 6 PM., Disp: 30 tablet, Rfl: 1 .  Blood Glucose Monitoring Suppl (ACCU-CHEK AVIVA PLUS) w/Device KIT, 1 Device by Does not apply route 4 (four) times daily., Disp: 1 kit, Rfl: 0 .  Brimonidine-Dorzolamide 0.15-2 % SOLN, Place 1 drop into both eyes 2 (two) times daily., Disp: , Rfl:  .  carboxymethylcellulose (REFRESH PLUS) 0.5 % SOLN, Place 1 drop into both eyes at bedtime as needed (for irritation). , Disp: , Rfl:  .  cetirizine (ZYRTEC) 10 MG tablet, Take 1 tablet (10 mg total) by mouth daily., Disp: 30 tablet, Rfl: 1 .  Cholecalciferol (VITAMIN D3) 2000 UNITS TABS, Take 2,000 Units by mouth daily., Disp: 30 tablet, Rfl: 11 .  diclofenac sodium (VOLTAREN) 1 % GEL, Apply 4 g topically 4 (four) times daily. TO AFFECTED AREAS, Disp: , Rfl:  .  Elastic Bandages & Supports (WRIST SPLINT/ELASTIC LEFT LG) MISC, 1 each by Does not apply route daily., Disp: 1 each, Rfl: 0 .  ENSURE PLUS (ENSURE PLUS) LIQD, Take 237 mLs by mouth 2 (two) times daily between meals., Disp: , Rfl:  .  finasteride  (PROSCAR) 5 MG tablet, Take 5 mg by mouth daily., Disp: , Rfl:  .  fluticasone (FLONASE) 50 MCG/ACT nasal spray, Place 2 sprays into both nostrils daily., Disp: , Rfl:  .  gabapentin (NEURONTIN) 300 MG capsule, Take 1 capsule (300 mg total) by mouth 2 (two) times daily. (Patient taking differently: Take 300 mg by mouth 3 (three) times daily. ), Disp: 30 capsule, Rfl: 0 .  glucose blood (ACCU-CHEK AVIVA) test strip, Use as instructed, Disp: 100 each, Rfl: 12 .  Incontinence Supply Disposable (PADSORBER BED PAN LINERS) MISC, 1 each by Does not apply route daily. (Patient not taking: Reported on 08/23/2018), Disp: 50 each, Rfl: 0 .  insulin glargine (LANTUS) 100 UNIT/ML injection, Inject 30 Units into the skin at bedtime. , Disp: , Rfl:  .  latanoprost (XALATAN) 0.005 % ophthalmic solution, Place 1 drop into both eyes at bedtime., Disp: , Rfl:  .  levETIRAcetam (KEPPRA) 500 MG tablet, Take 2 tablets (1,000 mg total) by mouth 2 (two) times daily. (Patient taking differently: Take 1,500 mg by mouth 2 (two) times daily. ), Disp: , Rfl:  .  lisinopril (PRINIVIL,ZESTRIL) 10 MG tablet, Take 1 tablet (10 mg total) by mouth daily., Disp: 90 tablet, Rfl: 3 .  meloxicam (MOBIC) 15 MG tablet, Take 15 mg by mouth daily as needed for pain., Disp: , Rfl:  .  metFORMIN (GLUCOPHAGE) 1000 MG tablet, Take 1  tablet (1,000 mg total) by mouth 2 (two) times daily with a meal., Disp: 60 tablet, Rfl: 0 .  Misc. Devices ([place]) MISC, 1 each by Does not apply route daily as needed., Disp: 1 each, Rfl: 0 .  Misc. Devices (QUAD CANE) MISC, 1 each by Does not apply route as needed., Disp: 1 each, Rfl: 0 .  pantoprazole (PROTONIX) 40 MG tablet, Take 1 tablet (40 mg total) by mouth daily. (Patient taking differently: Take 40 mg by mouth daily before breakfast. ), Disp: 30 tablet, Rfl: 5 .  polyethylene glycol (MIRALAX / GLYCOLAX) packet, Take 17 g by mouth daily. (Patient taking differently: Take 17 g by mouth daily  as needed for mild constipation. ), Disp: 14 each, Rfl: 0 .  protein supplement shake (PREMIER PROTEIN) LIQD, Take 325 mLs (11 oz total) by mouth 2 (two) times daily between meals., Disp: 7 Can, Rfl: 0 .  tamsulosin (FLOMAX) 0.4 MG CAPS capsule, Take 1 capsule (0.4 mg total) by mouth daily after supper., Disp: 30 capsule, Rfl: 0 .  Tiotropium Bromide-Olodaterol (STIOLTO RESPIMAT) 2.5-2.5 MCG/ACT AERS, Inhale 2 puffs into the lungs daily., Disp: , Rfl:  .  UNKNOWN TO PATIENT, Unnamed nebulizer solution: Nebulize 1 vial every 4-6 hours as needed for shortness of breath, Disp: , Rfl:  .  venlafaxine XR (EFFEXOR-XR) 150 MG 24 hr capsule, Take 150 mg by mouth daily with breakfast., Disp: , Rfl:  .  vitamin B-12 (CYANOCOBALAMIN) 500 MCG tablet, Take 500 mcg by mouth daily., Disp: , Rfl:   Past Medical History: Past Medical History:  Diagnosis Date  . Arthritis    knees  . Blood transfusion without reported diagnosis    during pacemaker surger  . BPH (benign prostatic hyperplasia)   . Cataract    bilateral  . Chronic insomnia 03/31/2016  . COPD (chronic obstructive pulmonary disease) ([place])   . Hyperlipidemia   . Hypertension   . Left-sided weakness 08/28/2016  . Myocardial infarction ([place])   . Pacemaker   . Shortness of breath   . Stroke ([place])   . Substance abuse ([place])   . Tremor, essential 03/31/2016  . Tremors of nervous system   . Tuberculosis    2000    Tobacco Use: Social History   Tobacco Use  Smoking Status Light Tobacco Smoker  . Packs/day: 0.10  . Years: 40.00  . Pack years: 4.00  . Types: Cigarettes  . Last attempt to quit: 08/06/2016  . Years since quitting: 2.0  Smokeless Tobacco Never Used  Tobacco Comment   pt still smokes every now and again 1-2 cigarettes    Labs: Recent Review Flowsheet Data    Labs for ITP Cardiac and Pulmonary Rehab Latest Ref Rng & Units 05/19/2017 05/20/2017 07/03/2017 02/21/2018 06/12/2018   Cholestrol 0 - 200 mg/dL - - - - 197   LDLCALC 0 -  99 mg/dL - - - - 126(H)   HDL >40 mg/dL - - - - 51   Trlycerides <150 mg/dL - - - - 101   Hemoglobin A1c 4.8 - 5.6 % - - 6.9 9.4(H) 6.5(H)   PHART 7.350 - 7.450 - 7.391 - - -   PCO2ART 32.0 - 48.0 mmHg - 39.9 - - -   HCO3 20.0 - 28.0 mmol/L - 23.4 - - -   TCO2 0 - 100 mmol/L 26 - - - -   ACIDBASEDEF 0.0 - 2.0 mmol/L - 0.6 - - -   O2SAT % - 92.6 - - -  Capillary Blood Glucose: Lab Results  Component Value Date   GLUCAP 151 (H) 06/13/2018   GLUCAP 109 (H) 06/13/2018   GLUCAP 112 (H) 06/12/2018   GLUCAP 129 (H) 06/12/2018   GLUCAP 106 (H) 06/12/2018     Pulmonary Assessment Scores: Pulmonary Assessment Scores    Row Name 08/26/18 1622         ADL UCSD   ADL Phase  Entry       mMRC Score   mMRC Score  3        Pulmonary Function Assessment:   Exercise Target Goals: Exercise Program Goal: Individual exercise prescription set using results from initial 6 min walk test and THRR while considering  patient's activity barriers and safety.   Exercise Prescription Goal: Initial exercise prescription builds to 30-45 minutes a day of aerobic activity, 2-3 days per week.  Home exercise guidelines will be given to patient during program as part of exercise prescription that the participant will acknowledge.  Activity Barriers & Risk Stratification: Activity Barriers & Cardiac Risk Stratification - 08/23/18 1443      Activity Barriers & Cardiac Risk Stratification   Activity Barriers  Balance Concerns;History of Falls;Assistive Device;Deconditioning;Back Problems;Muscular Weakness;Shortness of Breath;Arthritis;Joint Problems       6 Minute Walk: 6 Minute Walk    Row Name 08/26/18 1618         6 Minute Walk   Phase  Initial     Distance  600 feet     Walk Time  - 5 minutes     # of Rest Breaks  1 1 minute     MPH  1.13     METS  1.84     RPE  12     Perceived Dyspnea   2     Symptoms  Yes (comment)     Comments  used rollator/numbness in feet     Resting HR   60 bpm     Resting BP  122/80     Resting Oxygen Saturation   94 %     Exercise Oxygen Saturation  during 6 min walk  92 %     Max Ex. HR  79 bpm     Max Ex. BP  120/74       Interval HR   1 Minute HR  74     2 Minute HR  75     3 Minute HR  74     4 Minute HR  79     5 Minute HR  78     6 Minute HR  76     2 Minute Post HR  80     Interval Heart Rate?  Yes       Interval Oxygen   Interval Oxygen?  Yes     Baseline Oxygen Saturation %  94 %     1 Minute Oxygen Saturation %  92 %     1 Minute Liters of Oxygen  0 L     2 Minute Oxygen Saturation %  93 %     2 Minute Liters of Oxygen  0 L     3 Minute Oxygen Saturation %  94 %     3 Minute Liters of Oxygen  0 L     4 Minute Oxygen Saturation %  95 %     4 Minute Liters of Oxygen  0 L     5 Minute Oxygen Saturation %  96 %  5 Minute Liters of Oxygen  0 L     6 Minute Oxygen Saturation %  95 %     6 Minute Liters of Oxygen  0 L     2 Minute Post Oxygen Saturation %  95 %     2 Minute Post Liters of Oxygen  0 L        Oxygen Initial Assessment: Oxygen Initial Assessment - 08/26/18 1621      Initial 6 min Walk   Oxygen Used  None      Program Oxygen Prescription   Program Oxygen Prescription  None       Oxygen Re-Evaluation:   Oxygen Discharge (Final Oxygen Re-Evaluation):   Initial Exercise Prescription: Initial Exercise Prescription - 08/26/18 1600      Date of Initial Exercise RX and Referring Provider   Date  08/26/18    Referring Provider  Dr. Nelda Marseille      NuStep   Level  2    SPM  80    Minutes  17    METs  1.5      Arm Ergometer   Level  2    Watts  10    Minutes  17      Track   Laps  5    Minutes  17      Prescription Details   Frequency (times per week)  2    Duration  Progress to 45 minutes of aerobic exercise without signs/symptoms of physical distress      Intensity   THRR 40-80% of Max Heartrate  63-126    Ratings of Perceived Exertion  11-13    Perceived Dyspnea  0-4       Progression   Progression  Continue to progress workloads to maintain intensity without signs/symptoms of physical distress.      Resistance Training   Training Prescription  Yes    Weight  blue bands    Reps  10-15       Perform Capillary Blood Glucose checks as needed.  Exercise Prescription Changes:   Exercise Comments:   Exercise Goals and Review: Exercise Goals    Row Name 08/23/18 1444             Exercise Goals   Increase Physical Activity  Yes       Intervention  Provide advice, education, support and counseling about physical activity/exercise needs.;Develop an individualized exercise prescription for aerobic and resistive training based on initial evaluation findings, risk stratification, comorbidities and participant's personal goals.       Expected Outcomes  Short Term: Attend rehab on a regular basis to increase amount of physical activity.;Long Term: Exercising regularly at least 3-5 days a week.;Long Term: Add in home exercise to make exercise part of routine and to increase amount of physical activity.       Increase Strength and Stamina  Yes       Intervention  Provide advice, education, support and counseling about physical activity/exercise needs.;Develop an individualized exercise prescription for aerobic and resistive training based on initial evaluation findings, risk stratification, comorbidities and participant's personal goals.       Expected Outcomes  Short Term: Increase workloads from initial exercise prescription for resistance, speed, and METs.;Short Term: Perform resistance training exercises routinely during rehab and add in resistance training at home;Long Term: Improve cardiorespiratory fitness, muscular endurance and strength as measured by increased METs and functional capacity (6MWT)       Able to understand and use  rate of perceived exertion (RPE) scale  Yes       Intervention  Provide education and explanation on how to use RPE scale        Expected Outcomes  Short Term: Able to use RPE daily in rehab to express subjective intensity level;Long Term:  Able to use RPE to guide intensity level when exercising independently       Able to understand and use Dyspnea scale  Yes       Intervention  Provide education and explanation on how to use Dyspnea scale       Expected Outcomes  Short Term: Able to use Dyspnea scale daily in rehab to express subjective sense of shortness of breath during exertion;Long Term: Able to use Dyspnea scale to guide intensity level when exercising independently       Knowledge and understanding of Target Heart Rate Range (THRR)  Yes       Intervention  Provide education and explanation of THRR including how the numbers were predicted and where they are located for reference       Expected Outcomes  Short Term: Able to state/look up THRR;Long Term: Able to use THRR to govern intensity when exercising independently;Short Term: Able to use daily as guideline for intensity in rehab       Understanding of Exercise Prescription  Yes       Intervention  Provide education, explanation, and written materials on patient's individual exercise prescription       Expected Outcomes  Short Term: Able to explain program exercise prescription;Long Term: Able to explain home exercise prescription to exercise independently          Exercise Goals Re-Evaluation :   Discharge Exercise Prescription (Final Exercise Prescription Changes):   Nutrition:  Target Goals: Understanding of nutrition guidelines, daily intake of sodium <1548m, cholesterol <2087m calories 30% from fat and 7% or less from saturated fats, daily to have 5 or more servings of fruits and vegetables.  Biometrics:    Nutrition Therapy Plan and Nutrition Goals:   Nutrition Assessments:   Nutrition Goals Re-Evaluation:   Nutrition Goals Discharge (Final Nutrition Goals Re-Evaluation):   Psychosocial: Target Goals: Acknowledge presence or absence of  significant depression and/or stress, maximize coping skills, provide positive support system. Participant is able to verbalize types and ability to use techniques and skills needed for reducing stress and depression.  Initial Review & Psychosocial Screening: Initial Psych Review & Screening - 08/23/18 1448      Initial Review   Current issues with  Current Depression;Current Stress Concerns    Source of Stress Concerns  Unable to perform yard/household activities;Transportation;Unable to participate in former interests or hobbies      [place] Yes    Comments  supportive wife and son      Barriers   Psychosocial barriers to participate in program  The patient should benefit from training in stress management and relaxation.      Screening Interventions   Expected Outcomes  Long Term goal: The participant improves quality of Life and PHQ9 Scores as seen by post scores and/or verbalization of changes;Short Term goal: Identification and review with participant of any Quality of Life or Depression concerns found by scoring the questionnaire.       Quality of Life Scores:  Scores of 19 and below usually indicate a poorer quality of life in these areas.  A difference of  2-3 points is a clinically meaningful difference.  A difference of 2-3 points in the total score of the Quality of Life Index has been associated with significant improvement in overall quality of life, self-image, physical symptoms, and general health in studies assessing change in quality of life.  PHQ-9: Recent Review Flowsheet Data    Depression screen [facility] 2/9 08/23/2018 03/30/2017 02/16/2017 01/30/2017 01/19/2017   Decreased Interest '2 2 2 1 1   ' Down, Depressed, Hopeless '1 2 2 2 2   ' PHQ - 2 Score '3 4 4 3 3   ' Altered sleeping '1  2 1 2 3   ' Tired, decreased energy '3 2 2 2 2   ' Change in appetite '3  2 2 2 ' 0   Feeling bad or failure about yourself  '1 1 2 1 2   ' Trouble concentrating '1 2 2 2 2    ' Moving slowly or fidgety/restless '1 1 2 2 2   ' Suicidal thoughts 0 0 '1 1 1   ' PHQ-9 Score '13 14 16 15 15   ' Difficult doing work/chores Somewhat difficult - - - -     Interpretation of Total Score  Total Score Depression Severity:  1-4 = Minimal depression, 5-9 = Mild depression, 10-14 = Moderate depression, 15-19 = Moderately severe depression, 20-27 = Severe depression   Psychosocial Evaluation and Intervention: Psychosocial Evaluation - 08/23/18 1452      Psychosocial Evaluation & Interventions   Interventions  Relaxation education;Stress management education;Encouraged to exercise with the program and follow exercise prescription    Comments  pt with history of PTSD, high stress level    Expected Outcomes  Pt will demonstrate positive healthy coping skills and improve outlook in life    Continue Psychosocial Services   Follow up required by staff       Psychosocial Re-Evaluation:   Psychosocial Discharge (Final Psychosocial Re-Evaluation):   Education: Education Goals: Education classes will be provided on a weekly basis, covering required topics. Participant will state understanding/return demonstration of topics presented.  Learning Barriers/Preferences:   Education Topics: Risk Factor Reduction:  -Group instruction that is supported by a PowerPoint presentation. Instructor discusses the definition of a risk factor, different risk factors for pulmonary disease, and how the heart and lungs work together.     Nutrition for Pulmonary Patient:  -Group instruction provided by PowerPoint slides, verbal discussion, and written materials to support subject matter. The instructor gives an explanation and review of healthy diet recommendations, which includes a discussion on weight management, recommendations for fruit and vegetable consumption, as well as protein, fluid, caffeine, fiber, sodium, sugar, and alcohol. Tips for eating when patients are short of breath are  discussed.   Pursed Lip Breathing:  -Group instruction that is supported by demonstration and informational handouts. Instructor discusses the benefits of pursed lip and diaphragmatic breathing and detailed demonstration on how to preform both.     Oxygen Safety:  -Group instruction provided by PowerPoint, verbal discussion, and written material to support subject matter. There is an overview of "What is Oxygen" and "Why do we need it".  Instructor also reviews how to create a safe environment for oxygen use, the importance of using oxygen as prescribed, and the risks of noncompliance. There is a brief discussion on traveling with oxygen and resources the patient may utilize.   Oxygen Equipment:  -Group instruction provided by [facility] Staff utilizing handouts, written materials, and equipment demonstrations.   Signs and Symptoms:  -Group instruction provided by written material and verbal discussion to support subject matter. Warning  signs and symptoms of infection, stroke, and heart attack are reviewed and when to call the physician/911 reinforced. Tips for preventing the spread of infection discussed.   Advanced Directives:  -Group instruction provided by verbal instruction and written material to support subject matter. Instructor reviews Advanced Directive laws and proper instruction for filling out document.   Pulmonary Video:  -Group video education that reviews the importance of medication and oxygen compliance, exercise, good nutrition, pulmonary hygiene, and pursed lip and diaphragmatic breathing for the pulmonary patient.   Exercise for the Pulmonary Patient:  -Group instruction that is supported by a PowerPoint presentation. Instructor discusses benefits of exercise, core components of exercise, frequency, duration, and intensity of an exercise routine, importance of utilizing pulse oximetry during exercise, safety while exercising, and options of places to exercise outside  of rehab.     Pulmonary Medications:  -Verbally interactive group education provided by instructor with focus on inhaled medications and proper administration.   Anatomy and Physiology of the Respiratory System and Intimacy:  -Group instruction provided by PowerPoint, verbal discussion, and written material to support subject matter. Instructor reviews respiratory cycle and anatomical components of the respiratory system and their functions. Instructor also reviews differences in obstructive and restrictive respiratory diseases with examples of each. Intimacy, Sex, and Sexuality differences are reviewed with a discussion on how relationships can change when diagnosed with pulmonary disease. Common sexual concerns are reviewed.   MD DAY -A group question and answer session with a medical doctor that allows participants to ask questions that relate to their pulmonary disease state.   OTHER EDUCATION -Group or individual verbal, written, or video instructions that support the educational goals of the pulmonary rehab program.   Holiday Eating Survival Tips:  -Group instruction provided by PowerPoint slides, verbal discussion, and written materials to support subject matter. The instructor gives patients tips, tricks, and techniques to help them not only survive but enjoy the holidays despite the onslaught of food that accompanies the holidays.   Knowledge Questionnaire Score:   Core Components/Risk Factors/Patient Goals at Admission: Personal Goals and Risk Factors at Admission - 08/23/18 1453      Core Components/Risk Factors/Patient Goals on Admission    Weight Management  Yes;Weight Maintenance    Intervention  Weight Management/Obesity: Establish reasonable short term and long term weight goals.;Weight Management: Provide education and appropriate resources to help participant work on and attain dietary goals.;Weight Management: Develop a combined nutrition and exercise program  designed to reach desired caloric intake, while maintaining appropriate intake of nutrient and fiber, sodium and fats, and appropriate energy expenditure required for the weight goal.    Admit Weight  209 lb 7 oz (95 kg)    Expected Outcomes  Weight Maintenance: Understanding of the daily nutrition guidelines, which includes 25-35% calories from fat, 7% or less cal from saturated fats, less than 276m cholesterol, less than 1.5gm of sodium, & 5 or more servings of fruits and vegetables daily;Short Term: Continue to assess and modify interventions until short term weight is achieved;Long Term: Adherence to nutrition and physical activity/exercise program aimed toward attainment of established weight goal    Tobacco Cessation  Yes    Number of packs per day  1-2 cigarettes a day    Intervention  Assist the participant in steps to quit. Provide individualized education and counseling about committing to Tobacco Cessation, relapse prevention, and pharmacological support that can be provided by physician.;OAdvice worker assist with locating and accessing local/national Quit Smoking programs, and  support quit date choice.    Expected Outcomes  Short Term: Will demonstrate readiness to quit, by selecting a quit date.;Long Term: Complete abstinence from all tobacco products for at least 12 months from quit date.;Short Term: Will quit all tobacco product use, adhering to prevention of relapse plan.    Improve shortness of breath with ADL's  Yes    Intervention  Provide education, individualized exercise plan and daily activity instruction to help decrease symptoms of SOB with activities of daily living.    Expected Outcomes  Short Term: Improve cardiorespiratory fitness to achieve a reduction of symptoms when performing ADLs;Long Term: Be able to perform more ADLs without symptoms or delay the onset of symptoms    Diabetes  Yes    Intervention  Provide education about signs/symptoms and action to  take for hypo/hyperglycemia.;Provide education about proper nutrition, including hydration, and aerobic/resistive exercise prescription along with prescribed medications to achieve blood glucose in normal ranges: Fasting glucose 65-99 mg/dL    Expected Outcomes  Short Term: Participant verbalizes understanding of the signs/symptoms and immediate care of hyper/hypoglycemia, proper foot care and importance of medication, aerobic/resistive exercise and nutrition plan for blood glucose control.;Long Term: Attainment of HbA1C < 7%.    Hypertension  Yes    Intervention  Provide education on lifestyle modifcations including regular physical activity/exercise, weight management, moderate sodium restriction and increased consumption of fresh fruit, vegetables, and low fat dairy, alcohol moderation, and smoking cessation.;Monitor prescription use compliance.    Expected Outcomes  Short Term: Continued assessment and intervention until BP is < 140/25m HG in hypertensive participants. < 130/819mHG in hypertensive participants with diabetes, heart failure or chronic kidney disease.;Long Term: Maintenance of blood pressure at goal levels.    Lipids  Yes    Intervention  Provide education and support for participant on nutrition & aerobic/resistive exercise along with prescribed medications to achieve LDL <7066mHDL >40m88m  Expected Outcomes  Short Term: Participant states understanding of desired cholesterol values and is compliant with medications prescribed. Participant is following exercise prescription and nutrition guidelines.;Long Term: Cholesterol controlled with medications as prescribed, with individualized exercise RX and with personalized nutrition plan. Value goals: LDL < 70mg80mL > 40 mg.    Stress  Yes    Intervention  Offer individual and/or small group education and counseling on adjustment to heart disease, stress management and health-related lifestyle change. Teach and support self-help  strategies.;Refer participants experiencing significant psychosocial distress to appropriate mental health specialists for further evaluation and treatment. When possible, include family members and significant others in education/counseling sessions.    Expected Outcomes  Short Term: Participant demonstrates changes in health-related behavior, relaxation and other stress management skills, ability to obtain effective social support, and compliance with psychotropic medications if prescribed.;Long Term: Emotional wellbeing is indicated by absence of clinically significant psychosocial distress or social isolation.       Core Components/Risk Factors/Patient Goals Review:    Core Components/Risk Factors/Patient Goals at Discharge (Final Review):    ITP Comments: ITP Comments    Row Name 08/23/18 1422           ITP Comments  Dr. WessaManfred Archical Director Pulmonary Rehab          Comments:

## 2018-08-31 ENCOUNTER — Encounter (HOSPITAL_COMMUNITY)
Admission: RE | Admit: 2018-08-31 | Discharge: 2018-08-31 | Disposition: A | Discharge status: Home / Self Care | Payer: No Typology Code available for payment source | Source: Ambulatory Visit | Attending: Pulmonary Disease | Admitting: Pulmonary Disease

## 2018-08-31 VITALS — Wt 210.3 lb

## 2018-08-31 DIAGNOSIS — J449 Chronic obstructive pulmonary disease, unspecified: Secondary | ICD-10-CM | POA: Diagnosis not present

## 2018-08-31 NOTE — Progress Notes (Signed)
Daily Session Note  Patient Details  Name: Wesley [name] MRN: [id number] Date of Birth: Feb 17, 1956 Referring Provider:     Pulmonary Rehab Walk Test from 08/26/2018 in [place]  Referring Provider  Dr. Nelda Marseille      Encounter Date: 08/31/2018  Check In: Session Check In - 08/31/18 1238      Check-In   Supervising physician immediately available to respond to emergencies  [facility] immediately available    Physician(s)  Dr. Sloan Leiter    Location  [facility]    Staff Present  Maurice Small, RN, BSN;Molly DiVincenzo, MS, ACSM RCEP, Exercise Physiologist;Annedrea Rosezella [place], RN, Ramonita Lab, RN    Medication changes reported      No    Fall or balance concerns reported     No    Tobacco Cessation  No Change    Warm-up and Cool-down  Performed as group-led instruction    Resistance Training Performed  Yes    VAD Patient?  No    PAD/SET Patient?  No      Pain Assessment   Currently in Pain?  No/denies    Multiple Pain Sites  No       Capillary Blood Glucose: No results found for this or any previous visit (from the past 24 hour(s)). POCT Glucose - 08/31/18 1259      POCT Blood Glucose   Pre-Exercise  206 mg/dL    Post-Exercise  143 mg/dL      Exercise Prescription Changes - 08/31/18 1300      Response to Exercise   Blood Pressure (Admit)  122/70    Blood Pressure (Exercise)  132/82    Blood Pressure (Exit)  130/84    Heart Rate (Admit)  82 bpm    Heart Rate (Exercise)  106 bpm    Heart Rate (Exit)  60 bpm    Oxygen Saturation (Admit)  98 %    Oxygen Saturation (Exercise)  95 %    Oxygen Saturation (Exit)  98 %    Rating of Perceived Exertion (Exercise)  14    Perceived Dyspnea (Exercise)  3    Duration  Progress to 45 minutes of aerobic exercise without signs/symptoms of physical distress    Intensity  Other (comment)   40-80% of HRR     Progression   Progression  Continue to progress workloads to  maintain intensity without signs/symptoms of physical distress.      Resistance Training   Training Prescription  Yes    Weight  blue bands    Reps  10-15    Time  10 Minutes      NuStep   Level  2    SPM  80    Minutes  17    METs  1.8      Arm Ergometer   Level  2    Watts  10    Minutes  17      Track   Laps  4    Minutes  17       Social History   Tobacco Use  Smoking Status Light Tobacco Smoker  . Packs/day: 0.10  . Years: 40.00  . Pack years: 4.00  . Types: Cigarettes  . Last attempt to quit: 08/06/2016  . Years since quitting: 2.0  Smokeless Tobacco Never Used  Tobacco Comment   pt still smokes every now and again 1-2 cigarettes    Goals Met:  Exercise tolerated well No report  of cardiac concerns or symptoms Strength training completed today  Goals Unmet:  Not Applicable  Comments: Service time is from 1030 to 1210    Dr. Rush Farmer is Medical Director for Pulmonary Rehab at [facility].

## 2018-09-01 ENCOUNTER — Encounter (HOSPITAL_COMMUNITY): Payer: Self-pay | Admitting: *Deleted

## 2018-09-01 NOTE — Progress Notes (Addendum)
Pulmonary Individual Treatment Plan  Patient Details  Name: Wesley [name] MRN: [id number] Date of Birth: Dec 15, 1956 Referring Provider:     Pulmonary Rehab Walk Test from 08/26/2018 in [place]  Referring Provider  Dr. Nelda Marseille      Initial Encounter Date:    Pulmonary Rehab Walk Test from 08/26/2018 in [place]  Date  08/26/18      Visit Diagnosis: Chronic obstructive pulmonary disease, unspecified COPD type ([place])  Patient's Home Medications on Admission:  Current Outpatient Medications:  .  albuterol (PROVENTIL HFA;VENTOLIN HFA) 108 (90 Base) MCG/ACT inhaler, Inhale 2 puffs into the lungs every 6 (six) hours as needed for wheezing or shortness of breath., Disp: 1 Inhaler, Rfl: 2 .  aspirin 325 MG tablet, Take 325 mg by mouth daily., Disp: , Rfl:  .  atorvastatin (LIPITOR) 80 MG tablet, Take 1 tablet (80 mg total) by mouth daily at 6 PM., Disp: 30 tablet, Rfl: 1 .  Blood Glucose Monitoring Suppl (ACCU-CHEK AVIVA PLUS) w/Device KIT, 1 Device by Does not apply route 4 (four) times daily., Disp: 1 kit, Rfl: 0 .  Brimonidine-Dorzolamide 0.15-2 % SOLN, Place 1 drop into both eyes 2 (two) times daily., Disp: , Rfl:  .  carboxymethylcellulose (REFRESH PLUS) 0.5 % SOLN, Place 1 drop into both eyes at bedtime as needed (for irritation). , Disp: , Rfl:  .  cetirizine (ZYRTEC) 10 MG tablet, Take 1 tablet (10 mg total) by mouth daily., Disp: 30 tablet, Rfl: 1 .  Cholecalciferol (VITAMIN D3) 2000 UNITS TABS, Take 2,000 Units by mouth daily., Disp: 30 tablet, Rfl: 11 .  diclofenac sodium (VOLTAREN) 1 % GEL, Apply 4 g topically 4 (four) times daily. TO AFFECTED AREAS, Disp: , Rfl:  .  Elastic Bandages & Supports (WRIST SPLINT/ELASTIC LEFT LG) MISC, 1 each by Does not apply route daily., Disp: 1 each, Rfl: 0 .  ENSURE PLUS (ENSURE PLUS) LIQD, Take 237 mLs by mouth 2 (two) times daily between meals., Disp: , Rfl:  .  finasteride  (PROSCAR) 5 MG tablet, Take 5 mg by mouth daily., Disp: , Rfl:  .  fluticasone (FLONASE) 50 MCG/ACT nasal spray, Place 2 sprays into both nostrils daily., Disp: , Rfl:  .  gabapentin (NEURONTIN) 300 MG capsule, Take 1 capsule (300 mg total) by mouth 2 (two) times daily. (Patient taking differently: Take 300 mg by mouth 3 (three) times daily. ), Disp: 30 capsule, Rfl: 0 .  glucose blood (ACCU-CHEK AVIVA) test strip, Use as instructed, Disp: 100 each, Rfl: 12 .  Incontinence Supply Disposable (PADSORBER BED PAN LINERS) MISC, 1 each by Does not apply route daily. (Patient not taking: Reported on 08/23/2018), Disp: 50 each, Rfl: 0 .  insulin glargine (LANTUS) 100 UNIT/ML injection, Inject 30 Units into the skin at bedtime. , Disp: , Rfl:  .  latanoprost (XALATAN) 0.005 % ophthalmic solution, Place 1 drop into both eyes at bedtime., Disp: , Rfl:  .  levETIRAcetam (KEPPRA) 500 MG tablet, Take 2 tablets (1,000 mg total) by mouth 2 (two) times daily. (Patient taking differently: Take 1,500 mg by mouth 2 (two) times daily. ), Disp: , Rfl:  .  lisinopril (PRINIVIL,ZESTRIL) 10 MG tablet, Take 1 tablet (10 mg total) by mouth daily., Disp: 90 tablet, Rfl: 3 .  meloxicam (MOBIC) 15 MG tablet, Take 15 mg by mouth daily as needed for pain., Disp: , Rfl:  .  metFORMIN (GLUCOPHAGE) 1000 MG tablet, Take 1 tablet (  1,000 mg total) by mouth 2 (two) times daily with a meal., Disp: 60 tablet, Rfl: 0 .  Misc. Devices ([place]) MISC, 1 each by Does not apply route daily as needed., Disp: 1 each, Rfl: 0 .  Misc. Devices (QUAD CANE) MISC, 1 each by Does not apply route as needed., Disp: 1 each, Rfl: 0 .  pantoprazole (PROTONIX) 40 MG tablet, Take 1 tablet (40 mg total) by mouth daily. (Patient taking differently: Take 40 mg by mouth daily before breakfast. ), Disp: 30 tablet, Rfl: 5 .  polyethylene glycol (MIRALAX / GLYCOLAX) packet, Take 17 g by mouth daily. (Patient taking differently: Take 17 g by mouth daily  as needed for mild constipation. ), Disp: 14 each, Rfl: 0 .  protein supplement shake (PREMIER PROTEIN) LIQD, Take 325 mLs (11 oz total) by mouth 2 (two) times daily between meals., Disp: 7 Can, Rfl: 0 .  tamsulosin (FLOMAX) 0.4 MG CAPS capsule, Take 1 capsule (0.4 mg total) by mouth daily after supper., Disp: 30 capsule, Rfl: 0 .  Tiotropium Bromide-Olodaterol (STIOLTO RESPIMAT) 2.5-2.5 MCG/ACT AERS, Inhale 2 puffs into the lungs daily., Disp: , Rfl:  .  UNKNOWN TO PATIENT, Unnamed nebulizer solution: Nebulize 1 vial every 4-6 hours as needed for shortness of breath, Disp: , Rfl:  .  venlafaxine XR (EFFEXOR-XR) 150 MG 24 hr capsule, Take 150 mg by mouth daily with breakfast., Disp: , Rfl:  .  vitamin B-12 (CYANOCOBALAMIN) 500 MCG tablet, Take 500 mcg by mouth daily., Disp: , Rfl:   Past Medical History: Past Medical History:  Diagnosis Date  . Arthritis    knees  . Blood transfusion without reported diagnosis    during pacemaker surger  . BPH (benign prostatic hyperplasia)   . Cataract    bilateral  . Chronic insomnia 03/31/2016  . COPD (chronic obstructive pulmonary disease) ([place])   . Hyperlipidemia   . Hypertension   . Left-sided weakness 08/28/2016  . Myocardial infarction ([place])   . Pacemaker   . Shortness of breath   . Stroke ([place])   . Substance abuse ([place])   . Tremor, essential 03/31/2016  . Tremors of nervous system   . Tuberculosis    2000    Tobacco Use: Social History   Tobacco Use  Smoking Status Light Tobacco Smoker  . Packs/day: 0.10  . Years: 40.00  . Pack years: 4.00  . Types: Cigarettes  . Last attempt to quit: 08/06/2016  . Years since quitting: 2.0  Smokeless Tobacco Never Used  Tobacco Comment   pt still smokes every now and again 1-2 cigarettes    Labs: Recent Review Flowsheet Data    Labs for ITP Cardiac and Pulmonary Rehab Latest Ref Rng & Units 05/19/2017 05/20/2017 07/03/2017 02/21/2018 06/12/2018   Cholestrol 0 - 200 mg/dL - - - - 197   LDLCALC 0 -  99 mg/dL - - - - 126(H)   HDL >40 mg/dL - - - - 51   Trlycerides <150 mg/dL - - - - 101   Hemoglobin A1c 4.8 - 5.6 % - - 6.9 9.4(H) 6.5(H)   PHART 7.350 - 7.450 - 7.391 - - -   PCO2ART 32.0 - 48.0 mmHg - 39.9 - - -   HCO3 20.0 - 28.0 mmol/L - 23.4 - - -   TCO2 0 - 100 mmol/L 26 - - - -   ACIDBASEDEF 0.0 - 2.0 mmol/L - 0.6 - - -   O2SAT % - 92.6 - - -  Capillary Blood Glucose: Lab Results  Component Value Date   GLUCAP 151 (H) 06/13/2018   GLUCAP 109 (H) 06/13/2018   GLUCAP 112 (H) 06/12/2018   GLUCAP 129 (H) 06/12/2018   GLUCAP 106 (H) 06/12/2018   POCT Glucose    Row Name 08/31/18 1259             POCT Blood Glucose   Pre-Exercise  206 mg/dL       Post-Exercise  143 mg/dL          Exercise Target Goals: Exercise Program Goal: Individual exercise prescription set using results from initial 6 min walk test and THRR while considering  patient's activity barriers and safety.   Exercise Prescription Goal: Initial exercise prescription builds to 30-45 minutes a day of aerobic activity, 2-3 days per week.  Home exercise guidelines will be given to patient during program as part of exercise prescription that the participant will acknowledge.  Activity Barriers & Risk Stratification: Activity Barriers & Cardiac Risk Stratification - 08/23/18 1443      Activity Barriers & Cardiac Risk Stratification   Activity Barriers  Balance Concerns;History of Falls;Assistive Device;Deconditioning;Back Problems;Muscular Weakness;Shortness of Breath;Arthritis;Joint Problems       6 Minute Walk: 6 Minute Walk    Row Name 08/26/18 1618         6 Minute Walk   Phase  Initial     Distance  600 feet     Walk Time  - 5 minutes     # of Rest Breaks  1 1 minute     MPH  1.13     METS  1.84     RPE  12     Perceived Dyspnea   2     Symptoms  Yes (comment)     Comments  used rollator/numbness in feet     Resting HR  60 bpm     Resting BP  122/80     Resting Oxygen Saturation    94 %     Exercise Oxygen Saturation  during 6 min walk  92 %     Max Ex. HR  79 bpm     Max Ex. BP  120/74       Interval HR   1 Minute HR  74     2 Minute HR  75     3 Minute HR  74     4 Minute HR  79     5 Minute HR  78     6 Minute HR  76     2 Minute Post HR  80     Interval Heart Rate?  Yes       Interval Oxygen   Interval Oxygen?  Yes     Baseline Oxygen Saturation %  94 %     1 Minute Oxygen Saturation %  92 %     1 Minute Liters of Oxygen  0 L     2 Minute Oxygen Saturation %  93 %     2 Minute Liters of Oxygen  0 L     3 Minute Oxygen Saturation %  94 %     3 Minute Liters of Oxygen  0 L     4 Minute Oxygen Saturation %  95 %     4 Minute Liters of Oxygen  0 L     5 Minute Oxygen Saturation %  96 %     5 Minute Liters of Oxygen  0  L     6 Minute Oxygen Saturation %  95 %     6 Minute Liters of Oxygen  0 L     2 Minute Post Oxygen Saturation %  95 %     2 Minute Post Liters of Oxygen  0 L        Oxygen Initial Assessment: Oxygen Initial Assessment - 08/26/18 1621      Initial 6 min Walk   Oxygen Used  None      Program Oxygen Prescription   Program Oxygen Prescription  None       Oxygen Re-Evaluation:   Oxygen Discharge (Final Oxygen Re-Evaluation):   Initial Exercise Prescription: Initial Exercise Prescription - 08/26/18 1600      Date of Initial Exercise RX and Referring Provider   Date  08/26/18    Referring Provider  Dr. Nelda Marseille      NuStep   Level  2    SPM  80    Minutes  17    METs  1.5      Arm Ergometer   Level  2    Watts  10    Minutes  17      Track   Laps  5    Minutes  17      Prescription Details   Frequency (times per week)  2    Duration  Progress to 45 minutes of aerobic exercise without signs/symptoms of physical distress      Intensity   THRR 40-80% of Max Heartrate  63-126    Ratings of Perceived Exertion  11-13    Perceived Dyspnea  0-4      Progression   Progression  Continue to progress workloads to  maintain intensity without signs/symptoms of physical distress.      Resistance Training   Training Prescription  Yes    Weight  blue bands    Reps  10-15       Perform Capillary Blood Glucose checks as needed.  Exercise Prescription Changes: Exercise Prescription Changes    Row Name 08/31/18 1300             Response to Exercise   Blood Pressure (Admit)  122/70       Blood Pressure (Exercise)  132/82       Blood Pressure (Exit)  130/84       Heart Rate (Admit)  82 bpm       Heart Rate (Exercise)  106 bpm       Heart Rate (Exit)  60 bpm       Oxygen Saturation (Admit)  98 %       Oxygen Saturation (Exercise)  95 %       Oxygen Saturation (Exit)  98 %       Rating of Perceived Exertion (Exercise)  14       Perceived Dyspnea (Exercise)  3       Duration  Progress to 45 minutes of aerobic exercise without signs/symptoms of physical distress       Intensity  Other (comment) 40-80% of HRR         Progression   Progression  Continue to progress workloads to maintain intensity without signs/symptoms of physical distress.         Resistance Training   Training Prescription  Yes       Weight  blue bands       Reps  10-15       Time  10  Minutes         NuStep   Level  2       SPM  80       Minutes  17       METs  1.8         Arm Ergometer   Level  2       Watts  10       Minutes  17         Track   Laps  4       Minutes  17          Exercise Comments:   Exercise Goals and Review: Exercise Goals    Row Name 08/23/18 1444             Exercise Goals   Increase Physical Activity  Yes       Intervention  Provide advice, education, support and counseling about physical activity/exercise needs.;Develop an individualized exercise prescription for aerobic and resistive training based on initial evaluation findings, risk stratification, comorbidities and participant's personal goals.       Expected Outcomes  Short Term: Attend rehab on a regular basis to increase  amount of physical activity.;Long Term: Exercising regularly at least 3-5 days a week.;Long Term: Add in home exercise to make exercise part of routine and to increase amount of physical activity.       Increase Strength and Stamina  Yes       Intervention  Provide advice, education, support and counseling about physical activity/exercise needs.;Develop an individualized exercise prescription for aerobic and resistive training based on initial evaluation findings, risk stratification, comorbidities and participant's personal goals.       Expected Outcomes  Short Term: Increase workloads from initial exercise prescription for resistance, speed, and METs.;Short Term: Perform resistance training exercises routinely during rehab and add in resistance training at home;Long Term: Improve cardiorespiratory fitness, muscular endurance and strength as measured by increased METs and functional capacity (6MWT)       Able to understand and use rate of perceived exertion (RPE) scale  Yes       Intervention  Provide education and explanation on how to use RPE scale       Expected Outcomes  Short Term: Able to use RPE daily in rehab to express subjective intensity level;Long Term:  Able to use RPE to guide intensity level when exercising independently       Able to understand and use Dyspnea scale  Yes       Intervention  Provide education and explanation on how to use Dyspnea scale       Expected Outcomes  Short Term: Able to use Dyspnea scale daily in rehab to express subjective sense of shortness of breath during exertion;Long Term: Able to use Dyspnea scale to guide intensity level when exercising independently       Knowledge and understanding of Target Heart Rate Range (THRR)  Yes       Intervention  Provide education and explanation of THRR including how the numbers were predicted and where they are located for reference       Expected Outcomes  Short Term: Able to state/look up THRR;Long Term: Able to use THRR  to govern intensity when exercising independently;Short Term: Able to use daily as guideline for intensity in rehab       Understanding of Exercise Prescription  Yes       Intervention  Provide education, explanation, and written materials on patient's individual  exercise prescription       Expected Outcomes  Short Term: Able to explain program exercise prescription;Long Term: Able to explain home exercise prescription to exercise independently          Exercise Goals Re-Evaluation :   Discharge Exercise Prescription (Final Exercise Prescription Changes): Exercise Prescription Changes - 08/31/18 1300      Response to Exercise   Blood Pressure (Admit)  122/70    Blood Pressure (Exercise)  132/82    Blood Pressure (Exit)  130/84    Heart Rate (Admit)  82 bpm    Heart Rate (Exercise)  106 bpm    Heart Rate (Exit)  60 bpm    Oxygen Saturation (Admit)  98 %    Oxygen Saturation (Exercise)  95 %    Oxygen Saturation (Exit)  98 %    Rating of Perceived Exertion (Exercise)  14    Perceived Dyspnea (Exercise)  3    Duration  Progress to 45 minutes of aerobic exercise without signs/symptoms of physical distress    Intensity  Other (comment)   40-80% of HRR     Progression   Progression  Continue to progress workloads to maintain intensity without signs/symptoms of physical distress.      Resistance Training   Training Prescription  Yes    Weight  blue bands    Reps  10-15    Time  10 Minutes      NuStep   Level  2    SPM  80    Minutes  17    METs  1.8      Arm Ergometer   Level  2    Watts  10    Minutes  17      Track   Laps  4    Minutes  17       Nutrition:  Target Goals: Understanding of nutrition guidelines, daily intake of sodium <15107m, cholesterol <2066m calories 30% from fat and 7% or less from saturated fats, daily to have 5 or more servings of fruits and vegetables.  Biometrics:    Nutrition Therapy Plan and Nutrition Goals:   Nutrition  Assessments:   Nutrition Goals Re-Evaluation:   Nutrition Goals Re-Evaluation:   Nutrition Goals Discharge (Final Nutrition Goals Re-Evaluation):   Psychosocial: Target Goals: Acknowledge presence or absence of significant depression and/or stress, maximize coping skills, provide positive support system. Participant is able to verbalize types and ability to use techniques and skills needed for reducing stress and depression.  Initial Review & Psychosocial Screening: Initial Psych Review & Screening - 08/23/18 1448      Initial Review   Current issues with  Current Depression;Current Stress Concerns    Source of Stress Concerns  Unable to perform yard/household activities;Transportation;Unable to participate in former interests or hobbies      [place] Yes    Comments  supportive wife and son      Barriers   Psychosocial barriers to participate in program  The patient should benefit from training in stress management and relaxation.      Screening Interventions   Expected Outcomes  Long Term goal: The participant improves quality of Life and PHQ9 Scores as seen by post scores and/or verbalization of changes;Short Term goal: Identification and review with participant of any Quality of Life or Depression concerns found by scoring the questionnaire.       Quality of Life Scores:  Scores of 19 and below  usually indicate a poorer quality of life in these areas.  A difference of  2-3 points is a clinically meaningful difference.  A difference of 2-3 points in the total score of the Quality of Life Index has been associated with significant improvement in overall quality of life, self-image, physical symptoms, and general health in studies assessing change in quality of life.  PHQ-9: Recent Review Flowsheet Data    Depression screen [facility] 2/9 08/23/2018 03/30/2017 02/16/2017 01/30/2017 01/19/2017   Decreased Interest '2 2 2 1 1   ' Down, Depressed, Hopeless '1 2 2 2  2   ' PHQ - 2 Score '3 4 4 3 3   ' Altered sleeping '1  2 1 2 3   ' Tired, decreased energy '3 2 2 2 2   ' Change in appetite '3  2 2 2 ' 0   Feeling bad or failure about yourself  '1 1 2 1 2   ' Trouble concentrating '1 2 2 2 2   ' Moving slowly or fidgety/restless '1 1 2 2 2   ' Suicidal thoughts 0 0 '1 1 1   ' PHQ-9 Score '13 14 16 15 15   ' Difficult doing work/chores Somewhat difficult - - - -     Interpretation of Total Score  Total Score Depression Severity:  1-4 = Minimal depression, 5-9 = Mild depression, 10-14 = Moderate depression, 15-19 = Moderately severe depression, 20-27 = Severe depression   Psychosocial Evaluation and Intervention: Psychosocial Evaluation - 09/01/18 1512      Psychosocial Evaluation & Interventions   Interventions  Relaxation education;Stress management education;Encouraged to exercise with the program and follow exercise prescription    Comments  pt with history of PTSD, high stress level    Expected Outcomes  Pt will demonstrate positive healthy coping skills and improve outlook in life    Continue Psychosocial Services   Follow up required by staff       Psychosocial Re-Evaluation: Psychosocial Re-Evaluation    [place] Name 09/01/18 1512             Psychosocial Re-Evaluation   Current issues with  Current Depression;Current Stress Concerns       Comments  Pt has completed 1 exercise session        Expected Outcomes  Pt will report less stress and depression. Pt will utilize his resources through the [place] for PTSD       Interventions  Stress management education;Encouraged to attend Pulmonary Rehabilitation for the exercise;Relaxation education          Psychosocial Discharge (Final Psychosocial Re-Evaluation): Psychosocial Re-Evaluation - 09/01/18 1512      Psychosocial Re-Evaluation   Current issues with  Current Depression;Current Stress Concerns    Comments  Pt has completed 1 exercise session     Expected Outcomes  Pt will report less stress and depression. Pt  will utilize his resources through the [place] for PTSD    Interventions  Stress management education;Encouraged to attend Pulmonary Rehabilitation for the exercise;Relaxation education       Vocational Rehabilitation: Provide vocational rehab assistance to qualifying candidates.   Vocational Rehab Evaluation & Intervention: Vocational Rehab - 08/23/18 1453      Initial Vocational Rehab Evaluation & Intervention   Assessment shows need for Vocational Rehabilitation  No       Education: Education Goals: Education classes will be provided on a weekly basis, covering required topics. Participant will state understanding/return demonstration of topics presented.  Learning Barriers/Preferences:   Education Topics: Count Your Pulse:  -Group  instruction provided by verbal instruction, demonstration, patient participation and written materials to support subject.  Instructors address importance of being able to find your pulse and how to count your pulse when at home without a heart monitor.  Patients get hands on experience counting their pulse with staff help and individually.   Heart Attack, Angina, and Risk Factor Modification:  -Group instruction provided by verbal instruction, video, and written materials to support subject.  Instructors address signs and symptoms of angina and heart attacks.    Also discuss risk factors for heart disease and how to make changes to improve heart health risk factors.   Functional Fitness:  -Group instruction provided by verbal instruction, demonstration, patient participation, and written materials to support subject.  Instructors address safety measures for doing things around the house.  Discuss how to get up and down off the floor, how to pick things up properly, how to safely get out of a chair without assistance, and balance training.   Meditation and Mindfulness:  -Group instruction provided by verbal instruction, patient participation, and written  materials to support subject.  Instructor addresses importance of mindfulness and meditation practice to help reduce stress and improve awareness.  Instructor also leads participants through a meditation exercise.    Stretching for Flexibility and Mobility:  -Group instruction provided by verbal instruction, patient participation, and written materials to support subject.  Instructors lead participants through series of stretches that are designed to increase flexibility thus improving mobility.  These stretches are additional exercise for major muscle groups that are typically performed during regular warm up and cool down.   Hands Only CPR:  -Group verbal, video, and participation provides a basic overview of AHA guidelines for community CPR. Role-play of emergencies allow participants the opportunity to practice calling for help and chest compression technique with discussion of AED use.   Hypertension: -Group verbal and written instruction that provides a basic overview of hypertension including the most recent diagnostic guidelines, risk factor reduction with self-care instructions and medication management.    Nutrition I class: Heart Healthy Eating:  -Group instruction provided by PowerPoint slides, verbal discussion, and written materials to support subject matter. The instructor gives an explanation and review of the Therapeutic Lifestyle Changes diet recommendations, which includes a discussion on lipid goals, dietary fat, sodium, fiber, plant stanol/sterol esters, sugar, and the components of a well-balanced, healthy diet.   Nutrition II class: Lifestyle Skills:  -Group instruction provided by PowerPoint slides, verbal discussion, and written materials to support subject matter. The instructor gives an explanation and review of label reading, grocery shopping for heart health, heart healthy recipe modifications, and ways to make healthier choices when eating out.   Diabetes  Question & Answer:  -Group instruction provided by PowerPoint slides, verbal discussion, and written materials to support subject matter. The instructor gives an explanation and review of diabetes co-morbidities, pre- and post-prandial blood glucose goals, pre-exercise blood glucose goals, signs, symptoms, and treatment of hypoglycemia and hyperglycemia, and foot care basics.   Diabetes Blitz:  -Group instruction provided by PowerPoint slides, verbal discussion, and written materials to support subject matter. The instructor gives an explanation and review of the physiology behind type 1 and type 2 diabetes, diabetes medications and rational behind using different medications, pre- and post-prandial blood glucose recommendations and Hemoglobin A1c goals, diabetes diet, and exercise including blood glucose guidelines for exercising safely.    Portion Distortion:  -Group instruction provided by PowerPoint slides, verbal discussion, written materials, and food models  to support subject matter. The instructor gives an explanation of serving size versus portion size, changes in portions sizes over the last 20 years, and what consists of a serving from each food group.   Stress Management:  -Group instruction provided by verbal instruction, video, and written materials to support subject matter.  Instructors review role of stress in heart disease and how to cope with stress positively.     Exercising on Your Own:  -Group instruction provided by verbal instruction, power point, and written materials to support subject.  Instructors discuss benefits of exercise, components of exercise, frequency and intensity of exercise, and end points for exercise.  Also discuss use of nitroglycerin and activating EMS.  Review options of places to exercise outside of rehab.  Review guidelines for sex with heart disease.   Cardiac Drugs I:  -Group instruction provided by verbal instruction and written materials to  support subject.  Instructor reviews cardiac drug classes: antiplatelets, anticoagulants, beta blockers, and statins.  Instructor discusses reasons, side effects, and lifestyle considerations for each drug class.   Cardiac Drugs II:  -Group instruction provided by verbal instruction and written materials to support subject.  Instructor reviews cardiac drug classes: angiotensin converting enzyme inhibitors (ACE-I), angiotensin II receptor blockers (ARBs), nitrates, and calcium channel blockers.  Instructor discusses reasons, side effects, and lifestyle considerations for each drug class.   Anatomy and Physiology of the Circulatory System:  Group verbal and written instruction and models provide basic cardiac anatomy and physiology, with the coronary electrical and arterial systems. Review of: AMI, Angina, Valve disease, Heart Failure, Peripheral Artery Disease, Cardiac Arrhythmia, Pacemakers, and the ICD.   Other Education:  -Group or individual verbal, written, or video instructions that support the educational goals of the cardiac rehab program.   Holiday Eating Survival Tips:  -Group instruction provided by PowerPoint slides, verbal discussion, and written materials to support subject matter. The instructor gives patients tips, tricks, and techniques to help them not only survive but enjoy the holidays despite the onslaught of food that accompanies the holidays.   Knowledge Questionnaire Score:   Core Components/Risk Factors/Patient Goals at Admission: Personal Goals and Risk Factors at Admission - 08/23/18 1453      Core Components/Risk Factors/Patient Goals on Admission    Weight Management  Yes;Weight Maintenance    Intervention  Weight Management/Obesity: Establish reasonable short term and long term weight goals.;Weight Management: Provide education and appropriate resources to help participant work on and attain dietary goals.;Weight Management: Develop a combined nutrition and  exercise program designed to reach desired caloric intake, while maintaining appropriate intake of nutrient and fiber, sodium and fats, and appropriate energy expenditure required for the weight goal.    Admit Weight  209 lb 7 oz (95 kg)    Expected Outcomes  Weight Maintenance: Understanding of the daily nutrition guidelines, which includes 25-35% calories from fat, 7% or less cal from saturated fats, less than 246m cholesterol, less than 1.5gm of sodium, & 5 or more servings of fruits and vegetables daily;Short Term: Continue to assess and modify interventions until short term weight is achieved;Long Term: Adherence to nutrition and physical activity/exercise program aimed toward attainment of established weight goal    Tobacco Cessation  Yes    Number of packs per day  1-2 cigarettes a day    Intervention  Assist the participant in steps to quit. Provide individualized education and counseling about committing to Tobacco Cessation, relapse prevention, and pharmacological support that can be provided by physician.;Offer  self-teaching materials, assist with locating and accessing local/national Quit Smoking programs, and support quit date choice.    Expected Outcomes  Short Term: Will demonstrate readiness to quit, by selecting a quit date.;Long Term: Complete abstinence from all tobacco products for at least 12 months from quit date.;Short Term: Will quit all tobacco product use, adhering to prevention of relapse plan.    Improve shortness of breath with ADL's  Yes    Intervention  Provide education, individualized exercise plan and daily activity instruction to help decrease symptoms of SOB with activities of daily living.    Expected Outcomes  Short Term: Improve cardiorespiratory fitness to achieve a reduction of symptoms when performing ADLs;Long Term: Be able to perform more ADLs without symptoms or delay the onset of symptoms    Diabetes  Yes    Intervention  Provide education about  signs/symptoms and action to take for hypo/hyperglycemia.;Provide education about proper nutrition, including hydration, and aerobic/resistive exercise prescription along with prescribed medications to achieve blood glucose in normal ranges: Fasting glucose 65-99 mg/dL    Expected Outcomes  Short Term: Participant verbalizes understanding of the signs/symptoms and immediate care of hyper/hypoglycemia, proper foot care and importance of medication, aerobic/resistive exercise and nutrition plan for blood glucose control.;Long Term: Attainment of HbA1C < 7%.    Hypertension  Yes    Intervention  Provide education on lifestyle modifcations including regular physical activity/exercise, weight management, moderate sodium restriction and increased consumption of fresh fruit, vegetables, and low fat dairy, alcohol moderation, and smoking cessation.;Monitor prescription use compliance.    Expected Outcomes  Short Term: Continued assessment and intervention until BP is < 140/27m HG in hypertensive participants. < 130/855mHG in hypertensive participants with diabetes, heart failure or chronic kidney disease.;Long Term: Maintenance of blood pressure at goal levels.    Lipids  Yes    Intervention  Provide education and support for participant on nutrition & aerobic/resistive exercise along with prescribed medications to achieve LDL <7021mHDL >70m14m  Expected Outcomes  Short Term: Participant states understanding of desired cholesterol values and is compliant with medications prescribed. Participant is following exercise prescription and nutrition guidelines.;Long Term: Cholesterol controlled with medications as prescribed, with individualized exercise RX and with personalized nutrition plan. Value goals: LDL < 70mg84mL > 40 mg.    Stress  Yes    Intervention  Offer individual and/or small group education and counseling on adjustment to heart disease, stress management and health-related lifestyle change. Teach and  support self-help strategies.;Refer participants experiencing significant psychosocial distress to appropriate mental health specialists for further evaluation and treatment. When possible, include family members and significant others in education/counseling sessions.    Expected Outcomes  Short Term: Participant demonstrates changes in health-related behavior, relaxation and other stress management skills, ability to obtain effective social support, and compliance with psychotropic medications if prescribed.;Long Term: Emotional wellbeing is indicated by absence of clinically significant psychosocial distress or social isolation.       Core Components/Risk Factors/Patient Goals Review:  Goals and Risk Factor Review    Row Name 09/01/18 1514             Core Components/Risk Factors/Patient Goals Review   Personal Goals Review  Weight Management/Obesity;Develop more efficient breathing techniques such as purse lipped breathing and diaphragmatic breathing and practicing self-pacing with activity.;Stress;Tobacco Cessation;Increase knowledge of respiratory medications and ability to use respiratory devices properly.;Hypertension;Diabetes;Lipids;Improve shortness of breath with ADL's       Review  Pt has completed 1 exercise  session.  Unable to adequately assess pt progress toward goals.  Anticipate due to his determination he will show progress during the next 30 day assessment.       Expected Outcomes  See Admission Goals/Outcomes          Core Components/Risk Factors/Patient Goals at Discharge (Final Review):  Goals and Risk Factor Review - 09/01/18 1514      Core Components/Risk Factors/Patient Goals Review   Personal Goals Review  Weight Management/Obesity;Develop more efficient breathing techniques such as purse lipped breathing and diaphragmatic breathing and practicing self-pacing with activity.;Stress;Tobacco Cessation;Increase knowledge of respiratory medications and ability to use  respiratory devices properly.;Hypertension;Diabetes;Lipids;Improve shortness of breath with ADL's    Review  Pt has completed 1 exercise session.  Unable to adequately assess pt progress toward goals.  Anticipate due to his determination he will show progress during the next 30 day assessment.    Expected Outcomes  See Admission Goals/Outcomes       ITP Comments: ITP Comments    Row Name 08/23/18 1422 09/01/18 1512         ITP Comments  Dr. Manfred Arch, Medical Director Pulmonary Rehab  Dr. Manfred Arch, Medical Director Pulmonary Rehab         Comments: Pt completed 1 exercise session.  Continue to monitor pt progress. Cherre Huger, BSN Cardiac and Training and development officer

## 2018-09-02 ENCOUNTER — Encounter (HOSPITAL_COMMUNITY)
Admission: RE | Admit: 2018-09-02 | Discharge: 2018-09-02 | Disposition: A | Discharge status: Home / Self Care | Payer: No Typology Code available for payment source | Source: Ambulatory Visit | Attending: Pulmonary Disease | Admitting: Pulmonary Disease

## 2018-09-02 DIAGNOSIS — J449 Chronic obstructive pulmonary disease, unspecified: Secondary | ICD-10-CM | POA: Diagnosis not present

## 2018-09-02 NOTE — Progress Notes (Signed)
Daily Session Note  Patient Details  Name: Wesley [name] MRN: [id number] Date of Birth: 05/23/1956 Referring Provider:     Pulmonary Rehab Walk Test from 08/26/2018 in [place]  Referring Provider  Dr. Nelda Marseille      Encounter Date: 09/02/2018  Check In: Session Check In - 09/02/18 1234      Check-In   Supervising physician immediately available to respond to emergencies  [facility] immediately available    Physician(s)  Dr. Toma Copier    Location  [facility]    Staff Present  Maurice Small, RN, BSN;Molly DiVincenzo, MS, ACSM RCEP, Exercise Physiologist;Annedrea Rosezella [place], RN, Ramonita Lab, RN    Medication changes reported      No    Fall or balance concerns reported     No    Tobacco Cessation  No Change    Warm-up and Cool-down  Performed as group-led instruction    Resistance Training Performed  Yes    VAD Patient?  No    PAD/SET Patient?  No      Pain Assessment   Currently in Pain?  No/denies    Multiple Pain Sites  No       Capillary Blood Glucose: No results found for this or any previous visit (from the past 24 hour(s)).    Social History   Tobacco Use  Smoking Status Light Tobacco Smoker  . Packs/day: 0.10  . Years: 40.00  . Pack years: 4.00  . Types: Cigarettes  . Last attempt to quit: 08/06/2016  . Years since quitting: 2.0  Smokeless Tobacco Never Used  Tobacco Comment   pt still smokes every now and again 1-2 cigarettes    Goals Met:  Exercise tolerated well No report of cardiac concerns or symptoms Strength training completed today  Goals Unmet:  Not Applicable  Comments: Service time is from 1030 to 1220. Attended the Sedentary Lifestyle class.    Dr. Rush Farmer is Medical Director for Pulmonary Rehab at [facility].

## 2018-09-02 NOTE — Progress Notes (Signed)
Wesley [name] 62 y.o. male  DOB: Sep 16, 1956 MRN: [id number]           Nutrition Note 1. Chronic obstructive pulmonary disease, unspecified COPD type ([place])    Past Medical History:  Diagnosis Date  . Arthritis    knees  . Blood transfusion without reported diagnosis    during pacemaker surger  . BPH (benign prostatic hyperplasia)   . Cataract    bilateral  . Chronic insomnia 03/31/2016  . COPD (chronic obstructive pulmonary disease) ([place])   . Hyperlipidemia   . Hypertension   . Left-sided weakness 08/28/2016  . Myocardial infarction ([place])   . Pacemaker   . Shortness of breath   . Stroke ([place])   . Substance abuse ([place])   . Tremor, essential 03/31/2016  . Tremors of nervous system   . Tuberculosis    2000   Meds reviewed.   Current Outpatient Medications (Endocrine & Metabolic):  .  insulin glargine (LANTUS) 100 UNIT/ML injection, Inject 30 Units into the skin at bedtime.  .  metFORMIN (GLUCOPHAGE) 1000 MG tablet, Take 1 tablet (1,000 mg total) by mouth 2 (two) times daily with a meal.  Current Outpatient Medications (Cardiovascular):  .  atorvastatin (LIPITOR) 80 MG tablet, Take 1 tablet (80 mg total) by mouth daily at 6 PM. .  lisinopril (PRINIVIL,ZESTRIL) 10 MG tablet, Take 1 tablet (10 mg total) by mouth daily.  Current Outpatient Medications (Respiratory):  .  albuterol (PROVENTIL HFA;VENTOLIN HFA) 108 (90 Base) MCG/ACT inhaler, Inhale 2 puffs into the lungs every 6 (six) hours as needed for wheezing or shortness of breath. .  cetirizine (ZYRTEC) 10 MG tablet, Take 1 tablet (10 mg total) by mouth daily. .  fluticasone (FLONASE) 50 MCG/ACT nasal spray, Place 2 sprays into both nostrils daily. .  Tiotropium Bromide-Olodaterol (STIOLTO RESPIMAT) 2.5-2.5 MCG/ACT AERS, Inhale 2 puffs into the lungs daily.  Current Outpatient Medications (Analgesics):  .  aspirin 325 MG tablet, Take 325 mg by mouth daily. .  meloxicam (MOBIC) 15 MG tablet, Take 15 mg by mouth daily as  needed for pain.  Current Outpatient Medications (Hematological):  .  vitamin B-12 (CYANOCOBALAMIN) 500 MCG tablet, Take 500 mcg by mouth daily.  Current Outpatient Medications (Other):  .  Blood Glucose Monitoring Suppl (ACCU-CHEK AVIVA PLUS) w/Device KIT, 1 Device by Does not apply route 4 (four) times daily. .  Brimonidine-Dorzolamide 0.15-2 % SOLN, Place 1 drop into both eyes 2 (two) times daily. .  carboxymethylcellulose (REFRESH PLUS) 0.5 % SOLN, Place 1 drop into both eyes at bedtime as needed (for irritation).  .  Cholecalciferol (VITAMIN D3) 2000 UNITS TABS, Take 2,000 Units by mouth daily. .  diclofenac sodium (VOLTAREN) 1 % GEL, Apply 4 g topically 4 (four) times daily. TO AFFECTED AREAS .  Elastic Bandages & Supports (WRIST SPLINT/ELASTIC LEFT LG) MISC, 1 each by Does not apply route daily. Marland Kitchen  ENSURE PLUS (ENSURE PLUS) LIQD, Take 237 mLs by mouth 2 (two) times daily between meals. .  finasteride (PROSCAR) 5 MG tablet, Take 5 mg by mouth daily. Marland Kitchen  gabapentin (NEURONTIN) 300 MG capsule, Take 1 capsule (300 mg total) by mouth 2 (two) times daily. (Patient taking differently: Take 300 mg by mouth 3 (three) times daily. ) .  glucose blood (ACCU-CHEK AVIVA) test strip, Use as instructed .  Incontinence Supply Disposable (PADSORBER BED PAN LINERS) MISC, 1 each by Does not apply route daily. (Patient not taking: Reported on 08/23/2018) .  latanoprost (XALATAN) 0.005 %  ophthalmic solution, Place 1 drop into both eyes at bedtime. .  levETIRAcetam (KEPPRA) 500 MG tablet, Take 2 tablets (1,000 mg total) by mouth 2 (two) times daily. (Patient taking differently: Take 1,500 mg by mouth 2 (two) times daily. ) .  Misc. Devices ([place]) MISC, 1 each by Does not apply route daily as needed. .  Misc. Devices (QUAD CANE) MISC, 1 each by Does not apply route as needed. .  pantoprazole (PROTONIX) 40 MG tablet, Take 1 tablet (40 mg total) by mouth daily. (Patient taking differently: Take  40 mg by mouth daily before breakfast. ) .  polyethylene glycol (MIRALAX / GLYCOLAX) packet, Take 17 g by mouth daily. (Patient taking differently: Take 17 g by mouth daily as needed for mild constipation. ) .  protein supplement shake (PREMIER PROTEIN) LIQD, Take 325 mLs (11 oz total) by mouth 2 (two) times daily between meals. .  tamsulosin (FLOMAX) 0.4 MG CAPS capsule, Take 1 capsule (0.4 mg total) by mouth daily after supper. Marland Kitchen  UNKNOWN TO PATIENT, Unnamed nebulizer solution: Nebulize 1 vial every 4-6 hours as needed for shortness of breath .  venlafaxine XR (EFFEXOR-XR) 150 MG 24 hr capsule, Take 150 mg by mouth daily with breakfast.   Ht: Ht Readings from Last 1 Encounters:  08/23/18 6' 1.25" (1.861 m)     Wt:  Wt Readings from Last 3 Encounters:  08/31/18 210 lb 5.1 oz (95.4 kg)  08/23/18 209 lb 7 oz (95 kg)  06/12/18 206 lb 2.1 oz (93.5 kg)     BMI: Body mass index is 27.56 kg/m.    Current tobacco use? No    Labs:  Lipid Panel     Component Value Date/Time   CHOL 197 06/12/2018 0449   TRIG 101 06/12/2018 0449   HDL 51 06/12/2018 0449   CHOLHDL 3.9 06/12/2018 0449   VLDL 20 06/12/2018 0449   LDLCALC 126 (H) 06/12/2018 0449    Lab Results  Component Value Date   HGBA1C 6.5 (H) 06/12/2018    Nutrition Diagnosis ? Excessive sodium intake related to over consumption of processed food as evidenced by frequent consumption of convenience food/ canned vegetables and eating out frequently.   Goal(s) 1. To be determined  Plan:  Pt to attend Pulmonary Nutrition class Will provide client-centered nutrition education as part of interdisciplinary care.    Monitor and Evaluate progress toward nutrition goal with team.   Laurina Bustle, MS, RD, LDN 09/02/2018 7:57 AM

## 2018-09-07 ENCOUNTER — Encounter (HOSPITAL_COMMUNITY): Payer: No Typology Code available for payment source

## 2018-09-09 ENCOUNTER — Encounter (HOSPITAL_COMMUNITY)
Admission: RE | Admit: 2018-09-09 | Discharge: 2018-09-09 | Disposition: A | Discharge status: Home / Self Care | Payer: No Typology Code available for payment source | Source: Ambulatory Visit | Attending: Pulmonary Disease | Admitting: Pulmonary Disease

## 2018-09-09 DIAGNOSIS — J449 Chronic obstructive pulmonary disease, unspecified: Secondary | ICD-10-CM | POA: Diagnosis not present

## 2018-09-09 NOTE — Progress Notes (Signed)
Daily Session Note  Patient Details  Name: Wesley [name] MRN: [id number] Date of Birth: [birth year] Referring Provider:     Pulmonary Rehab Walk Test from 08/26/2018 in [place]  Referring Provider  Dr. Nelda Marseille      Encounter Date: 09/09/2018  Check In: Session Check In - 09/09/18 1002      Check-In   Supervising physician immediately available to respond to emergencies  [facility] immediately available    Physician(s)  Dr. Loleta Books    Location  [facility]    Staff Present  Maurice Small, RN, BSN;Hennie Gosa, MS, ACSM RCEP, Exercise Physiologist;Annedrea Stackhouse, RN, [place]    Medication changes reported      No    Fall or balance concerns reported     No    Tobacco Cessation  No Change    Warm-up and Cool-down  Performed as group-led instruction    Resistance Training Performed  Yes    VAD Patient?  No    PAD/SET Patient?  No      Pain Assessment   Currently in Pain?  No/denies    Multiple Pain Sites  No       Capillary Blood Glucose: No results found for this or any previous visit (from the past 24 hour(s)).    Social History   Tobacco Use  Smoking Status Light Tobacco Smoker  . Packs/day: 0.10  . Years: 40.00  . Pack years: 4.00  . Types: Cigarettes  . Last attempt to quit: 08/06/2016  . Years since quitting: 2.0  Smokeless Tobacco Never Used  Tobacco Comment   pt still smokes every now and again 1-2 cigarettes    Goals Met:  Exercise tolerated well  Goals Unmet:  Did not complete session due to dizziness  Comments: Service time is from 10:30a to 12:30p    Dr. Rush Farmer is Medical Director for Pulmonary Rehab at [facility].

## 2018-09-14 ENCOUNTER — Encounter (HOSPITAL_COMMUNITY)
Admission: RE | Admit: 2018-09-14 | Discharge: 2018-09-14 | Disposition: A | Discharge status: Home / Self Care | Payer: No Typology Code available for payment source | Source: Ambulatory Visit | Attending: Pulmonary Disease | Admitting: Pulmonary Disease

## 2018-09-14 VITALS — Wt 211.4 lb

## 2018-09-14 DIAGNOSIS — F17210 Nicotine dependence, cigarettes, uncomplicated: Secondary | ICD-10-CM | POA: Diagnosis not present

## 2018-09-14 DIAGNOSIS — N400 Enlarged prostate without lower urinary tract symptoms: Secondary | ICD-10-CM | POA: Insufficient documentation

## 2018-09-14 DIAGNOSIS — M179 Osteoarthritis of knee, unspecified: Secondary | ICD-10-CM | POA: Insufficient documentation

## 2018-09-14 DIAGNOSIS — Z7951 Long term (current) use of inhaled steroids: Secondary | ICD-10-CM | POA: Insufficient documentation

## 2018-09-14 DIAGNOSIS — R531 Weakness: Secondary | ICD-10-CM | POA: Diagnosis not present

## 2018-09-14 DIAGNOSIS — Z794 Long term (current) use of insulin: Secondary | ICD-10-CM | POA: Insufficient documentation

## 2018-09-14 DIAGNOSIS — I252 Old myocardial infarction: Secondary | ICD-10-CM | POA: Insufficient documentation

## 2018-09-14 DIAGNOSIS — G250 Essential tremor: Secondary | ICD-10-CM | POA: Insufficient documentation

## 2018-09-14 DIAGNOSIS — Z79899 Other long term (current) drug therapy: Secondary | ICD-10-CM | POA: Diagnosis not present

## 2018-09-14 DIAGNOSIS — Z8673 Personal history of transient ischemic attack (TIA), and cerebral infarction without residual deficits: Secondary | ICD-10-CM | POA: Insufficient documentation

## 2018-09-14 DIAGNOSIS — J449 Chronic obstructive pulmonary disease, unspecified: Secondary | ICD-10-CM | POA: Diagnosis not present

## 2018-09-14 DIAGNOSIS — Z950 Presence of cardiac pacemaker: Secondary | ICD-10-CM | POA: Diagnosis not present

## 2018-09-14 DIAGNOSIS — E785 Hyperlipidemia, unspecified: Secondary | ICD-10-CM | POA: Diagnosis not present

## 2018-09-14 DIAGNOSIS — I10 Essential (primary) hypertension: Secondary | ICD-10-CM | POA: Diagnosis not present

## 2018-09-14 LAB — GLUCOSE, CAPILLARY
Glucose-Capillary: 102 mg/dL — ABNORMAL HIGH (ref 70–99)
Glucose-Capillary: 132 mg/dL — ABNORMAL HIGH (ref 70–99)

## 2018-09-14 NOTE — Progress Notes (Signed)
Daily Session Note  Patient Details  Name: Wesley [name] MRN: [id number] Date of Birth: [birth year] Referring Provider:     Pulmonary Rehab Walk Test from 08/26/2018 in [place]  Referring Provider  Dr. Nelda Marseille      Encounter Date: 09/14/2018  Check In: Session Check In - 09/14/18 1238      Check-In   Supervising physician immediately available to respond to emergencies  [facility] immediately available    Physician(s)  Dr. Louanne Belton    Location  [facility]    Staff Present  Maurice Small, RN, BSN;Maeghan Canny, MS, ACSM RCEP, Exercise Physiologist;Lisa Ysidro Evert, Felipe Drone, RN, MHA    Medication changes reported      No    Fall or balance concerns reported     No    Tobacco Cessation  No Change    Warm-up and Cool-down  Performed as group-led instruction    Resistance Training Performed  Yes    VAD Patient?  No    PAD/SET Patient?  No      Pain Assessment   Currently in Pain?  No/denies    Pain Score  0-No pain    Multiple Pain Sites  No       Capillary Blood Glucose: Results for orders placed or performed during the hospital encounter of 09/14/18 (from the past 24 hour(s))  Glucose, capillary     Status: Abnormal   Collection Time: 09/14/18 10:53 AM  Result Value Ref Range   Glucose-Capillary 102 (H) 70 - 99 mg/dL  Glucose, capillary     Status: Abnormal   Collection Time: 09/14/18 12:17 PM  Result Value Ref Range   Glucose-Capillary 132 (H) 70 - 99 mg/dL    Exercise Prescription Changes - 09/14/18 1500      Response to Exercise   Blood Pressure (Admit)  138/86    Blood Pressure (Exercise)  124/80    Blood Pressure (Exit)  112/78    Heart Rate (Admit)  85 bpm    Heart Rate (Exercise)  74 bpm    Heart Rate (Exit)  80 bpm    Oxygen Saturation (Admit)  97 %    Oxygen Saturation (Exercise)  98 %    Oxygen Saturation (Exit)  95 %    Rating of Perceived Exertion (Exercise)  13    Perceived  Dyspnea (Exercise)  2    Duration  Progress to 45 minutes of aerobic exercise without signs/symptoms of physical distress    Intensity  Other (comment)   40-80% of HRR     Progression   Progression  Continue to progress workloads to maintain intensity without signs/symptoms of physical distress.      Resistance Training   Training Prescription  Yes    Weight  blue bands    Reps  10-15    Time  10 Minutes      NuStep   Level  3    SPM  80    Minutes  45    METs  2.4       Social History   Tobacco Use  Smoking Status Light Tobacco Smoker  . Packs/day: 0.10  . Years: 40.00  . Pack years: 4.00  . Types: Cigarettes  . Last attempt to quit: 08/06/2016  . Years since quitting: 2.1  Smokeless Tobacco Never Used  Tobacco Comment   pt still smokes every now and again 1-2 cigarettes    Goals Met:  Exercise tolerated well  Goals Unmet:  Not Applicable  Comments: Service time is from 10:30a to 12:15p    Dr. Rush Farmer is Medical Director for Pulmonary Rehab at [facility].

## 2018-09-16 ENCOUNTER — Encounter (HOSPITAL_COMMUNITY): Payer: No Typology Code available for payment source

## 2018-09-21 ENCOUNTER — Encounter (HOSPITAL_COMMUNITY)
Admission: RE | Admit: 2018-09-21 | Discharge: 2018-09-21 | Disposition: A | Discharge status: Home / Self Care | Payer: No Typology Code available for payment source | Source: Ambulatory Visit | Attending: Pulmonary Disease | Admitting: Pulmonary Disease

## 2018-09-21 DIAGNOSIS — J449 Chronic obstructive pulmonary disease, unspecified: Secondary | ICD-10-CM | POA: Diagnosis not present

## 2018-09-21 NOTE — Progress Notes (Signed)
Daily Session Note  Patient Details  Name: Wesley [name] MRN: [id number] Date of Birth: 06/17/1956 Referring Provider:     Pulmonary Rehab Walk Test from 08/26/2018 in [facility]  Referring Provider  Dr. Yacoub      Encounter Date: 09/21/2018  Check In: Session Check In - 09/21/18 1202      Check-In   Supervising physician immediately available to respond to emergencies  [facility] immediately available    Physician(s)  Dr.Ezenduka     Location  [facility]    Staff Present   , MS, ACSM RCEP, Exercise Physiologist;Lisa Hughes, RN;Annedrea Stackhouse, RN, MHA    Medication changes reported      No    Fall or balance concerns reported     No    Tobacco Cessation  No Change    Warm-up and Cool-down  Performed as group-led instruction    Resistance Training Performed  Yes    VAD Patient?  No    PAD/SET Patient?  No      Pain Assessment   Currently in Pain?  No/denies       Capillary Blood Glucose: No results found for this or any previous visit (from the past 24 hour(s)).    Social History   Tobacco Use  Smoking Status Light Tobacco Smoker  . Packs/day: 0.10  . Years: 40.00  . Pack years: 4.00  . Types: Cigarettes  . Last attempt to quit: 08/06/2016  . Years since quitting: 2.1  Smokeless Tobacco Never Used  Tobacco Comment   pt still smokes every now and again 1-2 cigarettes    Goals Met:  Exercise tolerated well  Goals Unmet:  Not Applicable  Comments: Service time is from 10:30a to 12:15p    Dr. Wesam G. Yacoub is Medical Director for Pulmonary Rehab at [facility]. 

## 2018-09-23 ENCOUNTER — Encounter (HOSPITAL_COMMUNITY)
Admission: RE | Admit: 2018-09-23 | Discharge: 2018-09-23 | Disposition: A | Discharge status: Home / Self Care | Payer: No Typology Code available for payment source | Source: Ambulatory Visit | Attending: Pulmonary Disease | Admitting: Pulmonary Disease

## 2018-09-23 DIAGNOSIS — J449 Chronic obstructive pulmonary disease, unspecified: Secondary | ICD-10-CM | POA: Diagnosis not present

## 2018-09-23 NOTE — Progress Notes (Signed)
Daily Session Note  Patient Details  Name: Wesley [name] MRN: [id number] Date of Birth: 12-Oct-1956 Referring Provider:     Pulmonary Rehab Walk Test from 08/26/2018 in [place]  Referring Provider  Dr. Nelda Marseille      Encounter Date: 09/23/2018  Check In: Session Check In - 09/23/18 1252      Check-In   Supervising physician immediately available to respond to emergencies  [facility] immediately available    Physician(s)  Dr. Herbert Moors    Location  [facility]    Staff Present  Su Hilt, MS, ACSM RCEP, Exercise Physiologist;Aaliya Maultsby Kris Mouton, MS, Exercise Physiologist;Lisa Colletta [place], RN, MHA    Medication changes reported      No    Fall or balance concerns reported     No    Tobacco Cessation  No Change    Warm-up and Cool-down  Performed as group-led instruction    Resistance Training Performed  Yes    VAD Patient?  No    PAD/SET Patient?  No      Pain Assessment   Currently in Pain?  No/denies    Pain Score  0-No pain    Multiple Pain Sites  No       Capillary Blood Glucose: No results found for this or any previous visit (from the past 24 hour(s)).    Social History   Tobacco Use  Smoking Status Light Tobacco Smoker  . Packs/day: 0.10  . Years: 40.00  . Pack years: 4.00  . Types: Cigarettes  . Last attempt to quit: 08/06/2016  . Years since quitting: 2.1  Smokeless Tobacco Never Used  Tobacco Comment   pt still smokes every now and again 1-2 cigarettes    Goals Met:  Exercise tolerated well  Goals Unmet:  Not Applicable  Comments: Service time is from 10:30a to 12:30p    Dr. Rush Farmer is Medical Director for Pulmonary Rehab at [facility].

## 2018-09-28 ENCOUNTER — Other Ambulatory Visit: Payer: Self-pay

## 2018-09-28 ENCOUNTER — Encounter (HOSPITAL_COMMUNITY)
Admission: RE | Admit: 2018-09-28 | Discharge: 2018-09-28 | Disposition: A | Discharge status: Home / Self Care | Payer: No Typology Code available for payment source | Source: Ambulatory Visit | Attending: Pulmonary Disease | Admitting: Pulmonary Disease

## 2018-09-28 ENCOUNTER — Emergency Department (HOSPITAL_COMMUNITY)
Admission: EM | Admit: 2018-09-28 | Discharge: 2018-09-28 | Disposition: A | Discharge status: Home / Self Care | Payer: No Typology Code available for payment source | Attending: Emergency Medicine | Admitting: Emergency Medicine

## 2018-09-28 ENCOUNTER — Emergency Department (HOSPITAL_COMMUNITY): Payer: No Typology Code available for payment source

## 2018-09-28 VITALS — Wt 211.9 lb

## 2018-09-28 DIAGNOSIS — F17210 Nicotine dependence, cigarettes, uncomplicated: Secondary | ICD-10-CM | POA: Insufficient documentation

## 2018-09-28 DIAGNOSIS — Z950 Presence of cardiac pacemaker: Secondary | ICD-10-CM | POA: Insufficient documentation

## 2018-09-28 DIAGNOSIS — R202 Paresthesia of skin: Secondary | ICD-10-CM | POA: Diagnosis not present

## 2018-09-28 DIAGNOSIS — M62838 Other muscle spasm: Secondary | ICD-10-CM | POA: Diagnosis not present

## 2018-09-28 DIAGNOSIS — J449 Chronic obstructive pulmonary disease, unspecified: Secondary | ICD-10-CM | POA: Diagnosis not present

## 2018-09-28 DIAGNOSIS — I10 Essential (primary) hypertension: Secondary | ICD-10-CM | POA: Insufficient documentation

## 2018-09-28 DIAGNOSIS — Z794 Long term (current) use of insulin: Secondary | ICD-10-CM | POA: Insufficient documentation

## 2018-09-28 DIAGNOSIS — M25551 Pain in right hip: Secondary | ICD-10-CM | POA: Diagnosis present

## 2018-09-28 DIAGNOSIS — M5430 Sciatica, unspecified side: Secondary | ICD-10-CM | POA: Diagnosis not present

## 2018-09-28 DIAGNOSIS — Z7982 Long term (current) use of aspirin: Secondary | ICD-10-CM | POA: Insufficient documentation

## 2018-09-28 DIAGNOSIS — E119 Type 2 diabetes mellitus without complications: Secondary | ICD-10-CM | POA: Insufficient documentation

## 2018-09-28 DIAGNOSIS — Z8673 Personal history of transient ischemic attack (TIA), and cerebral infarction without residual deficits: Secondary | ICD-10-CM | POA: Insufficient documentation

## 2018-09-28 DIAGNOSIS — Z79899 Other long term (current) drug therapy: Secondary | ICD-10-CM | POA: Diagnosis not present

## 2018-09-28 LAB — CBC WITH DIFFERENTIAL/PLATELET
Abs Immature Granulocytes: 0.01 10*3/uL (ref 0.00–0.07)
BASOS PCT: 1 %
Basophils Absolute: 0 10*3/uL (ref 0.0–0.1)
EOS ABS: 0.1 10*3/uL (ref 0.0–0.5)
Eosinophils Relative: 1 %
HCT: 46.5 % (ref 39.0–52.0)
Hemoglobin: 13.9 g/dL (ref 13.0–17.0)
IMMATURE GRANULOCYTES: 0 %
Lymphocytes Relative: 44 %
Lymphs Abs: 2.8 10*3/uL (ref 0.7–4.0)
MCH: 26 pg (ref 26.0–34.0)
MCHC: 29.9 g/dL — ABNORMAL LOW (ref 30.0–36.0)
MCV: 87.1 fL (ref 80.0–100.0)
Monocytes Absolute: 0.4 10*3/uL (ref 0.1–1.0)
Monocytes Relative: 7 %
NEUTROS PCT: 47 %
Neutro Abs: 3.1 10*3/uL (ref 1.7–7.7)
PLATELETS: 187 10*3/uL (ref 150–400)
RBC: 5.34 MIL/uL (ref 4.22–5.81)
RDW: 14.2 % (ref 11.5–15.5)
WBC: 6.5 10*3/uL (ref 4.0–10.5)
nRBC: 0 % (ref 0.0–0.2)

## 2018-09-28 LAB — BASIC METABOLIC PANEL
ANION GAP: 7 (ref 5–15)
BUN: 20 mg/dL (ref 8–23)
CALCIUM: 9.2 mg/dL (ref 8.9–10.3)
CO2: 26 mmol/L (ref 22–32)
Chloride: 107 mmol/L (ref 98–111)
Creatinine, Ser: 0.81 mg/dL (ref 0.61–1.24)
GFR calc Af Amer: >60 mL/min (ref >60)
Glucose, Bld: 69 mg/dL — ABNORMAL LOW (ref 70–99)
Potassium: 3.6 mmol/L (ref 3.5–5.1)
Sodium: 140 mmol/L (ref 135–145)

## 2018-09-28 LAB — CBG MONITORING, ED
Glucose-Capillary: 61 mg/dL — ABNORMAL LOW (ref 70–99)
Glucose-Capillary: 72 mg/dL (ref 70–99)

## 2018-09-28 LAB — MAGNESIUM: Magnesium: 2 mg/dL (ref 1.7–2.4)

## 2018-09-28 MED ORDER — KETOROLAC TROMETHAMINE 60 MG/2ML IM SOLN
60.0000 mg | Freq: Once | INTRAMUSCULAR | Status: AC
  Administered 2018-09-28: 60 mg via INTRAMUSCULAR
  Filled 2018-09-28: qty 2

## 2018-09-28 MED ORDER — DIAZEPAM 5 MG PO TABS
5.0000 mg | ORAL_TABLET | Freq: Once | ORAL | Status: AC
  Administered 2018-09-28: 5 mg via ORAL
  Filled 2018-09-28: qty 1

## 2018-09-28 MED ORDER — CYCLOBENZAPRINE HCL 5 MG PO TABS: 5.0000 mg | ORAL_TABLET | Freq: Three times a day (TID) | ORAL | 0 refills | Status: AC | PRN

## 2018-09-28 NOTE — ED Notes (Signed)
CBG 61 RN notified.

## 2018-09-28 NOTE — ED Notes (Signed)
Patient transported to CT 
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
## 2018-09-28 NOTE — ED Notes (Signed)
PB and crackers given to pt

## 2018-09-28 NOTE — ED Notes (Signed)
Patient verbalizes understanding of discharge instructions. Opportunity for questioning and answers were provided. Armband removed by staff, pt discharged from ED ambulatory with wife.  
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
## 2018-09-28 NOTE — ED Triage Notes (Signed)
Pt in c/o chronic R leg pain that worsened x 3 days ago, pt denies new injury, denies bowel and bladder incontinence, pt states, "I did have numbness in my right leg that got better when I walk, but now it stays numb." A&O x4

## 2018-09-29 NOTE — Progress Notes (Signed)
Pulmonary Individual Treatment Plan  Patient Details  Name: Wesley [name] MRN: [id number] Date of Birth: 1956/07/05 Referring Provider:     Pulmonary Rehab Walk Test from 08/26/2018 in [facility]  Referring Provider  Dr. Nelda Marseille      Initial Encounter Date:    Pulmonary Rehab Walk Test from 08/26/2018 in [facility]  Date  08/26/18      Visit Diagnosis: Chronic obstructive pulmonary disease, unspecified COPD type ([place])  Patient's Home Medications on Admission:   Current Outpatient Medications:  .  albuterol (PROVENTIL HFA;VENTOLIN HFA) 108 (90 Base) MCG/ACT inhaler, Inhale 2 puffs into the lungs every 6 (six) hours as needed for wheezing or shortness of breath., Disp: 1 Inhaler, Rfl: 2 .  aspirin 325 MG tablet, Take 325 mg by mouth daily., Disp: , Rfl:  .  atorvastatin (LIPITOR) 80 MG tablet, Take 1 tablet (80 mg total) by mouth daily at 6 PM., Disp: 30 tablet, Rfl: 1 .  Blood Glucose Monitoring Suppl (ACCU-CHEK AVIVA PLUS) w/Device KIT, 1 Device by Does not apply route 4 (four) times daily., Disp: 1 kit, Rfl: 0 .  Brimonidine-Dorzolamide 0.15-2 % SOLN, Place 1 drop into both eyes 2 (two) times daily., Disp: , Rfl:  .  carboxymethylcellulose (REFRESH PLUS) 0.5 % SOLN, Place 1 drop into both eyes at bedtime as needed (for irritation). , Disp: , Rfl:  .  cetirizine (ZYRTEC) 10 MG tablet, Take 1 tablet (10 mg total) by mouth daily., Disp: 30 tablet, Rfl: 1 .  Cholecalciferol (VITAMIN D3) 2000 UNITS TABS, Take 2,000 Units by mouth daily., Disp: 30 tablet, Rfl: 11 .  cyclobenzaprine (FLEXERIL) 5 MG tablet, Take 1 tablet (5 mg total) by mouth 3 (three) times daily as needed for up to 12 days for muscle spasms., Disp: 12 tablet, Rfl: 0 .  diclofenac sodium (VOLTAREN) 1 % GEL, Apply 4 g topically 4 (four) times daily. TO AFFECTED AREAS, Disp: , Rfl:  .  Elastic Bandages & Supports (WRIST SPLINT/ELASTIC LEFT LG) MISC, 1 each by  Does not apply route daily., Disp: 1 each, Rfl: 0 .  ENSURE PLUS (ENSURE PLUS) LIQD, Take 237 mLs by mouth 2 (two) times daily between meals., Disp: , Rfl:  .  finasteride (PROSCAR) 5 MG tablet, Take 5 mg by mouth daily., Disp: , Rfl:  .  fluticasone (FLONASE) 50 MCG/ACT nasal spray, Place 2 sprays into both nostrils daily., Disp: , Rfl:  .  gabapentin (NEURONTIN) 300 MG capsule, Take 1 capsule (300 mg total) by mouth 2 (two) times daily. (Patient taking differently: Take 300 mg by mouth 3 (three) times daily. ), Disp: 30 capsule, Rfl: 0 .  glucose blood (ACCU-CHEK AVIVA) test strip, Use as instructed, Disp: 100 each, Rfl: 12 .  Incontinence Supply Disposable (PADSORBER BED PAN LINERS) MISC, 1 each by Does not apply route daily. (Patient not taking: Reported on 08/23/2018), Disp: 50 each, Rfl: 0 .  insulin glargine (LANTUS) 100 UNIT/ML injection, Inject 30 Units into the skin at bedtime. , Disp: , Rfl:  .  latanoprost (XALATAN) 0.005 % ophthalmic solution, Place 1 drop into both eyes at bedtime., Disp: , Rfl:  .  levETIRAcetam (KEPPRA) 500 MG tablet, Take 2 tablets (1,000 mg total) by mouth 2 (two) times daily. (Patient taking differently: Take 1,500 mg by mouth 2 (two) times daily. ), Disp: , Rfl:  .  lisinopril (PRINIVIL,ZESTRIL) 10 MG tablet, Take 1 tablet (10 mg total) by mouth daily., Disp:  90 tablet, Rfl: 3 .  meloxicam (MOBIC) 15 MG tablet, Take 15 mg by mouth daily as needed for pain., Disp: , Rfl:  .  metFORMIN (GLUCOPHAGE) 1000 MG tablet, Take 1 tablet (1,000 mg total) by mouth 2 (two) times daily with a meal., Disp: 60 tablet, Rfl: 0 .  Misc. Devices ([place]) MISC, 1 each by Does not apply route daily as needed., Disp: 1 each, Rfl: 0 .  Misc. Devices (QUAD CANE) MISC, 1 each by Does not apply route as needed., Disp: 1 each, Rfl: 0 .  pantoprazole (PROTONIX) 40 MG tablet, Take 1 tablet (40 mg total) by mouth daily. (Patient taking differently: Take 40 mg by mouth daily  before breakfast. ), Disp: 30 tablet, Rfl: 5 .  polyethylene glycol (MIRALAX / GLYCOLAX) packet, Take 17 g by mouth daily. (Patient taking differently: Take 17 g by mouth daily as needed for mild constipation. ), Disp: 14 each, Rfl: 0 .  protein supplement shake (PREMIER PROTEIN) LIQD, Take 325 mLs (11 oz total) by mouth 2 (two) times daily between meals., Disp: 7 Can, Rfl: 0 .  tamsulosin (FLOMAX) 0.4 MG CAPS capsule, Take 1 capsule (0.4 mg total) by mouth daily after supper., Disp: 30 capsule, Rfl: 0 .  Tiotropium Bromide-Olodaterol (STIOLTO RESPIMAT) 2.5-2.5 MCG/ACT AERS, Inhale 2 puffs into the lungs daily., Disp: , Rfl:  .  UNKNOWN TO PATIENT, Unnamed nebulizer solution: Nebulize 1 vial every 4-6 hours as needed for shortness of breath, Disp: , Rfl:  .  venlafaxine XR (EFFEXOR-XR) 150 MG 24 hr capsule, Take 150 mg by mouth daily with breakfast., Disp: , Rfl:  .  vitamin B-12 (CYANOCOBALAMIN) 500 MCG tablet, Take 500 mcg by mouth daily., Disp: , Rfl:   Past Medical History: Past Medical History:  Diagnosis Date  . Arthritis    knees  . Blood transfusion without reported diagnosis    during pacemaker surger  . BPH (benign prostatic hyperplasia)   . Cataract    bilateral  . Chronic insomnia 03/31/2016  . COPD (chronic obstructive pulmonary disease) ([place])   . Hyperlipidemia   . Hypertension   . Left-sided weakness 08/28/2016  . Myocardial infarction ([place])   . Pacemaker   . Shortness of breath   . Stroke ([place])   . Substance abuse ([place])   . Tremor, essential 03/31/2016  . Tremors of nervous system   . Tuberculosis    2000    Tobacco Use: Social History   Tobacco Use  Smoking Status Light Tobacco Smoker  . Packs/day: 0.10  . Years: 40.00  . Pack years: 4.00  . Types: Cigarettes  . Last attempt to quit: 08/06/2016  . Years since quitting: 2.1  Smokeless Tobacco Never Used  Tobacco Comment   pt still smokes every now and again 1-2 cigarettes    Labs: Recent Review  Flowsheet Data    Labs for ITP Cardiac and Pulmonary Rehab Latest Ref Rng & Units 05/19/2017 05/20/2017 07/03/2017 02/21/2018 06/12/2018   Cholestrol 0 - 200 mg/dL - - - - 197   LDLCALC 0 - 99 mg/dL - - - - 126(H)   HDL >40 mg/dL - - - - 51   Trlycerides <150 mg/dL - - - - 101   Hemoglobin A1c 4.8 - 5.6 % - - 6.9 9.4(H) 6.5(H)   PHART 7.350 - 7.450 - 7.391 - - -   PCO2ART 32.0 - 48.0 mmHg - 39.9 - - -   HCO3 20.0 - 28.0 mmol/L -  23.4 - - -   TCO2 0 - 100 mmol/L 26 - - - -   ACIDBASEDEF 0.0 - 2.0 mmol/L - 0.6 - - -   O2SAT % - 92.6 - - -      Capillary Blood Glucose: Lab Results  Component Value Date   GLUCAP 72 09/28/2018   GLUCAP 61 (L) 09/28/2018   GLUCAP 132 (H) 09/14/2018   GLUCAP 102 (H) 09/14/2018   GLUCAP 151 (H) 06/13/2018   POCT Glucose    Row Name 08/31/18 1259 09/14/18 1601 09/28/18 1536         POCT Blood Glucose   Pre-Exercise  206 mg/dL  102 mg/dL  95 mg/dL     Post-Exercise  143 mg/dL  127 mg/dL given snack before exercise  95 mg/dL        Pulmonary Assessment Scores: Pulmonary Assessment Scores    Row Name 08/26/18 1622 09/01/18 1523       ADL UCSD   ADL Phase  Entry  Entry    SOB Score total  -  69      CAT Score   CAT Score  -  24      mMRC Score   mMRC Score  3  -       Pulmonary Function Assessment:   Exercise Target Goals: Exercise Program Goal: Individual exercise prescription set using results from initial 6 min walk test and THRR while considering  patient's activity barriers and safety.   Exercise Prescription Goal: Initial exercise prescription builds to 30-45 minutes a day of aerobic activity, 2-3 days per week.  Home exercise guidelines will be given to patient during program as part of exercise prescription that the participant will acknowledge.  Activity Barriers & Risk Stratification: Activity Barriers & Cardiac Risk Stratification - 08/23/18 1443      Activity Barriers & Cardiac Risk Stratification   Activity Barriers   Balance Concerns;History of Falls;Assistive Device;Deconditioning;Back Problems;Muscular Weakness;Shortness of Breath;Arthritis;Joint Problems       6 Minute Walk: 6 Minute Walk    Row Name 08/26/18 1618         6 Minute Walk   Phase  Initial     Distance  600 feet     Walk Time  - 5 minutes     # of Rest Breaks  1 1 minute     MPH  1.13     METS  1.84     RPE  12     Perceived Dyspnea   2     Symptoms  Yes (comment)     Comments  used rollator/numbness in feet     Resting HR  60 bpm     Resting BP  122/80     Resting Oxygen Saturation   94 %     Exercise Oxygen Saturation  during 6 min walk  92 %     Max Ex. HR  79 bpm     Max Ex. BP  120/74       Interval HR   1 Minute HR  74     2 Minute HR  75     3 Minute HR  74     4 Minute HR  79     5 Minute HR  78     6 Minute HR  76     2 Minute Post HR  80     Interval Heart Rate?  Yes       Interval Oxygen  Interval Oxygen?  Yes     Baseline Oxygen Saturation %  94 %     1 Minute Oxygen Saturation %  92 %     1 Minute Liters of Oxygen  0 L     2 Minute Oxygen Saturation %  93 %     2 Minute Liters of Oxygen  0 L     3 Minute Oxygen Saturation %  94 %     3 Minute Liters of Oxygen  0 L     4 Minute Oxygen Saturation %  95 %     4 Minute Liters of Oxygen  0 L     5 Minute Oxygen Saturation %  96 %     5 Minute Liters of Oxygen  0 L     6 Minute Oxygen Saturation %  95 %     6 Minute Liters of Oxygen  0 L     2 Minute Post Oxygen Saturation %  95 %     2 Minute Post Liters of Oxygen  0 L        Oxygen Initial Assessment: Oxygen Initial Assessment - 08/26/18 1621      Initial 6 min Walk   Oxygen Used  None      Program Oxygen Prescription   Program Oxygen Prescription  None       Oxygen Re-Evaluation: Oxygen Re-Evaluation    Row Name 09/28/18 1544             Program Oxygen Prescription   Program Oxygen Prescription  None         Home Oxygen   Home Oxygen Device  None       Sleep Oxygen  Prescription  CPAP       Liters per minute  0       Home Exercise Oxygen Prescription  None       Home at Rest Exercise Oxygen Prescription  None          Oxygen Discharge (Final Oxygen Re-Evaluation): Oxygen Re-Evaluation - 09/28/18 1544      Program Oxygen Prescription   Program Oxygen Prescription  None      Home Oxygen   Home Oxygen Device  None    Sleep Oxygen Prescription  CPAP    Liters per minute  0    Home Exercise Oxygen Prescription  None    Home at Rest Exercise Oxygen Prescription  None       Initial Exercise Prescription: Initial Exercise Prescription - 08/26/18 1600      Date of Initial Exercise RX and Referring Provider   Date  08/26/18    Referring Provider  Dr. Nelda Marseille      NuStep   Level  2    SPM  80    Minutes  17    METs  1.5      Arm Ergometer   Level  2    Watts  10    Minutes  17      Track   Laps  5    Minutes  17      Prescription Details   Frequency (times per week)  2    Duration  Progress to 45 minutes of aerobic exercise without signs/symptoms of physical distress      Intensity   THRR 40-80% of Max Heartrate  63-126    Ratings of Perceived Exertion  11-13    Perceived Dyspnea  0-4      Progression  Progression  Continue to progress workloads to maintain intensity without signs/symptoms of physical distress.      Resistance Training   Training Prescription  Yes    Weight  blue bands    Reps  10-15       Perform Capillary Blood Glucose checks as needed.  Exercise Prescription Changes: Exercise Prescription Changes    Row Name 08/31/18 1300 09/14/18 1500 09/28/18 1500         Response to Exercise   Blood Pressure (Admit)  122/70  138/86  110/72     Blood Pressure (Exercise)  132/82  124/80  110/80     Blood Pressure (Exit)  130/84  112/78  110/64     Heart Rate (Admit)  82 bpm  85 bpm  85 bpm     Heart Rate (Exercise)  106 bpm  74 bpm  84 bpm     Heart Rate (Exit)  60 bpm  80 bpm  90 bpm     Oxygen Saturation  (Admit)  98 %  97 %  98 %     Oxygen Saturation (Exercise)  95 %  98 %  96 %     Oxygen Saturation (Exit)  98 %  95 %  100 %     Rating of Perceived Exertion (Exercise)  '14  13  15     ' Perceived Dyspnea (Exercise)  '3  2  2     ' Duration  Progress to 45 minutes of aerobic exercise without signs/symptoms of physical distress  Progress to 45 minutes of aerobic exercise without signs/symptoms of physical distress  Progress to 45 minutes of aerobic exercise without signs/symptoms of physical distress     Intensity  Other (comment) 40-80% of HRR  Other (comment) 40-80% of HRR  Other (comment) 40-80% of HRR       Progression   Progression  Continue to progress workloads to maintain intensity without signs/symptoms of physical distress.  Continue to progress workloads to maintain intensity without signs/symptoms of physical distress.  Continue to progress workloads to maintain intensity without signs/symptoms of physical distress.       Resistance Training   Training Prescription  Yes  Yes  Yes     Weight  blue bands  blue bands  blue bands     Reps  10-15  10-15  10-15     Time  10 Minutes  10 Minutes  10 Minutes       NuStep   Level  '2  3  3     ' SPM  80  80  80     Minutes  17  45  30     METs  1.8  2.4  2.5       Arm Ergometer   Level  2  -  -     Watts  10  -  -     Minutes  17  -  -       Track   Laps  4  -  5     Minutes  17  -  17        Exercise Comments:   Exercise Goals and Review: Exercise Goals    Row Name 08/23/18 1444             Exercise Goals   Increase Physical Activity  Yes       Intervention  Provide advice, education, support and counseling about physical activity/exercise needs.;Develop an individualized exercise prescription for  aerobic and resistive training based on initial evaluation findings, risk stratification, comorbidities and participant's personal goals.       Expected Outcomes  Short Term: Attend rehab on a regular basis to increase amount of  physical activity.;Long Term: Exercising regularly at least 3-5 days a week.;Long Term: Add in home exercise to make exercise part of routine and to increase amount of physical activity.       Increase Strength and Stamina  Yes       Intervention  Provide advice, education, support and counseling about physical activity/exercise needs.;Develop an individualized exercise prescription for aerobic and resistive training based on initial evaluation findings, risk stratification, comorbidities and participant's personal goals.       Expected Outcomes  Short Term: Increase workloads from initial exercise prescription for resistance, speed, and METs.;Short Term: Perform resistance training exercises routinely during rehab and add in resistance training at home;Long Term: Improve cardiorespiratory fitness, muscular endurance and strength as measured by increased METs and functional capacity (6MWT)       Able to understand and use rate of perceived exertion (RPE) scale  Yes       Intervention  Provide education and explanation on how to use RPE scale       Expected Outcomes  Short Term: Able to use RPE daily in rehab to express subjective intensity level;Long Term:  Able to use RPE to guide intensity level when exercising independently       Able to understand and use Dyspnea scale  Yes       Intervention  Provide education and explanation on how to use Dyspnea scale       Expected Outcomes  Short Term: Able to use Dyspnea scale daily in rehab to express subjective sense of shortness of breath during exertion;Long Term: Able to use Dyspnea scale to guide intensity level when exercising independently       Knowledge and understanding of Target Heart Rate Range (THRR)  Yes       Intervention  Provide education and explanation of THRR including how the numbers were predicted and where they are located for reference       Expected Outcomes  Short Term: Able to state/look up THRR;Long Term: Able to use THRR to govern  intensity when exercising independently;Short Term: Able to use daily as guideline for intensity in rehab       Understanding of Exercise Prescription  Yes       Intervention  Provide education, explanation, and written materials on patient's individual exercise prescription       Expected Outcomes  Short Term: Able to explain program exercise prescription;Long Term: Able to explain home exercise prescription to exercise independently          Exercise Goals Re-Evaluation : Exercise Goals Re-Evaluation    [place] Name 09/28/18 1544             Exercise Goal Re-Evaluation   Exercise Goals Review  Increase Physical Activity;Increase Strength and Stamina;Able to understand and use rate of perceived exertion (RPE) scale;Able to understand and use Dyspnea scale;Knowledge and understanding of Target Heart Rate Range (THRR);Understanding of Exercise Prescription       Comments  Patient is progressing slowly. Barriers are leg pain and stability. Patient's MET level places him in a low to moderate level (2.5). Is able to walk 4 laps (200 ft) in 15 minutes. Patient went to the ED today with extreme leg pain. Will cont to monitor and progress patient as able.  Expected Outcomes  Through exercise at rehab and at home, patient will increase functional ability, have a decrease in sob with ADL's, and establish an exercise regimine at home.          Discharge Exercise Prescription (Final Exercise Prescription Changes): Exercise Prescription Changes - 09/28/18 1500      Response to Exercise   Blood Pressure (Admit)  110/72    Blood Pressure (Exercise)  110/80    Blood Pressure (Exit)  110/64    Heart Rate (Admit)  85 bpm    Heart Rate (Exercise)  84 bpm    Heart Rate (Exit)  90 bpm    Oxygen Saturation (Admit)  98 %    Oxygen Saturation (Exercise)  96 %    Oxygen Saturation (Exit)  100 %    Rating of Perceived Exertion (Exercise)  15    Perceived Dyspnea (Exercise)  2    Duration  Progress to 45  minutes of aerobic exercise without signs/symptoms of physical distress    Intensity  Other (comment)   40-80% of HRR     Progression   Progression  Continue to progress workloads to maintain intensity without signs/symptoms of physical distress.      Resistance Training   Training Prescription  Yes    Weight  blue bands    Reps  10-15    Time  10 Minutes      NuStep   Level  3    SPM  80    Minutes  30    METs  2.5      Track   Laps  5    Minutes  17       Nutrition:  Target Goals: Understanding of nutrition guidelines, daily intake of sodium <1549m, cholesterol <2037m calories 30% from fat and 7% or less from saturated fats, daily to have 5 or more servings of fruits and vegetables.  Biometrics:    Nutrition Therapy Plan and Nutrition Goals:   Nutrition Assessments:   Nutrition Goals Re-Evaluation:   Nutrition Goals Discharge (Final Nutrition Goals Re-Evaluation):   Psychosocial: Target Goals: Acknowledge presence or absence of significant depression and/or stress, maximize coping skills, provide positive support system. Participant is able to verbalize types and ability to use techniques and skills needed for reducing stress and depression.  Initial Review & Psychosocial Screening: Initial Psych Review & Screening - 08/23/18 1448      Initial Review   Current issues with  Current Depression;Current Stress Concerns    Source of Stress Concerns  Unable to perform yard/household activities;Transportation;Unable to participate in former interests or hobbies      [place] Yes    Comments  supportive wife and son      Barriers   Psychosocial barriers to participate in program  The patient should benefit from training in stress management and relaxation.      Screening Interventions   Expected Outcomes  Long Term goal: The participant improves quality of Life and PHQ9 Scores as seen by post scores and/or verbalization of  changes;Short Term goal: Identification and review with participant of any Quality of Life or Depression concerns found by scoring the questionnaire.       Quality of Life Scores:  Scores of 19 and below usually indicate a poorer quality of life in these areas.  A difference of  2-3 points is a clinically meaningful difference.  A difference of 2-3 points in the total score of the  Quality of Life Index has been associated with significant improvement in overall quality of life, self-image, physical symptoms, and general health in studies assessing change in quality of life.  PHQ-9: Recent Review Flowsheet Data    Depression screen [facility] 2/9 08/23/2018 03/30/2017 02/16/2017 01/30/2017 01/19/2017   Decreased Interest '2 2 2 1 1   ' Down, Depressed, Hopeless '1 2 2 2 2   ' PHQ - 2 Score '3 4 4 3 3   ' Altered sleeping '1  2 1 2 3   ' Tired, decreased energy '3 2 2 2 2   ' Change in appetite '3  2 2 2 ' 0   Feeling bad or failure about yourself  '1 1 2 1 2   ' Trouble concentrating '1 2 2 2 2   ' Moving slowly or fidgety/restless '1 1 2 2 2   ' Suicidal thoughts 0 0 '1 1 1   ' PHQ-9 Score '13 14 16 15 15   ' Difficult doing work/chores Somewhat difficult - - - -     Interpretation of Total Score  Total Score Depression Severity:  1-4 = Minimal depression, 5-9 = Mild depression, 10-14 = Moderate depression, 15-19 = Moderately severe depression, 20-27 = Severe depression   Psychosocial Evaluation and Intervention: Psychosocial Evaluation - 09/29/18 1654      Psychosocial Evaluation & Interventions   Interventions  Relaxation education;Stress management education;Encouraged to exercise with the program and follow exercise prescription    Comments  pt with history of PTSD, high stress level    Expected Outcomes  Pt will demonstrate positive healthy coping skills and improve outlook in life    Continue Psychosocial Services   Follow up required by staff       Psychosocial Re-Evaluation: Psychosocial Re-Evaluation    [place] Name  09/01/18 1512 09/29/18 1654           Psychosocial Re-Evaluation   Current issues with  Current Depression;Current Stress Concerns  Current Depression;Current Stress Concerns      Comments  Pt has completed 1 exercise session   continue to mange his stress and depression.      Expected Outcomes  Pt will report less stress and depression. Pt will utilize his resources through the [place] for PTSD  Pt will report less stress and depression. Pt will utilize his resources through the [place] for PTSD      Interventions  Stress management education;Encouraged to attend Pulmonary Rehabilitation for the exercise;Relaxation education  Stress management education;Encouraged to attend Pulmonary Rehabilitation for the exercise;Relaxation education        Initial Review   Source of Stress Concerns  -  Unable to perform yard/household activities;Transportation;Unable to participate in former interests or hobbies         Psychosocial Discharge (Final Psychosocial Re-Evaluation): Psychosocial Re-Evaluation - 09/29/18 1654      Psychosocial Re-Evaluation   Current issues with  Current Depression;Current Stress Concerns    Comments  continue to mange his stress and depression.    Expected Outcomes  Pt will report less stress and depression. Pt will utilize his resources through the [place] for PTSD    Interventions  Stress management education;Encouraged to attend Pulmonary Rehabilitation for the exercise;Relaxation education      Initial Review   Source of Stress Concerns  Unable to perform yard/household activities;Transportation;Unable to participate in former interests or hobbies       Education: Education Goals: Education classes will be provided on a weekly basis, covering required topics. Participant will state understanding/return demonstration of topics presented.  Learning Barriers/Preferences:   Education Topics: Risk Factor Reduction:  -Group instruction that is supported by a PowerPoint  presentation. Instructor discusses the definition of a risk factor, different risk factors for pulmonary disease, and how the heart and lungs work together.     Nutrition for Pulmonary Patient:  -Group instruction provided by PowerPoint slides, verbal discussion, and written materials to support subject matter. The instructor gives an explanation and review of healthy diet recommendations, which includes a discussion on weight management, recommendations for fruit and vegetable consumption, as well as protein, fluid, caffeine, fiber, sodium, sugar, and alcohol. Tips for eating when patients are short of breath are discussed.   Pursed Lip Breathing:  -Group instruction that is supported by demonstration and informational handouts. Instructor discusses the benefits of pursed lip and diaphragmatic breathing and detailed demonstration on how to preform both.     Oxygen Safety:  -Group instruction provided by PowerPoint, verbal discussion, and written material to support subject matter. There is an overview of "What is Oxygen" and "Why do we need it".  Instructor also reviews how to create a safe environment for oxygen use, the importance of using oxygen as prescribed, and the risks of noncompliance. There is a brief discussion on traveling with oxygen and resources the patient may utilize.   Oxygen Equipment:  -Group instruction provided by [facility] Staff utilizing handouts, written materials, and equipment demonstrations.   PULMONARY REHAB OTHER RESPIRATORY from 09/23/2018 in [place]  Date  09/23/18  Educator  Ace Gins  Instruction Review Code  1- Verbalizes Understanding      Signs and Symptoms:  -Group instruction provided by written material and verbal discussion to support subject matter. Warning signs and symptoms of infection, stroke, and heart attack are reviewed and when to call the physician/911 reinforced. Tips for preventing the spread of  infection discussed.   PULMONARY REHAB OTHER RESPIRATORY from 09/23/2018 in [place]  Date  09/09/18  Educator  RN      Advanced Directives:  -Group instruction provided by verbal instruction and written material to support subject matter. Instructor reviews Advanced Directive laws and proper instruction for filling out document.   Pulmonary Video:  -Group video education that reviews the importance of medication and oxygen compliance, exercise, good nutrition, pulmonary hygiene, and pursed lip and diaphragmatic breathing for the pulmonary patient.   Exercise for the Pulmonary Patient:  -Group instruction that is supported by a PowerPoint presentation. Instructor discusses benefits of exercise, core components of exercise, frequency, duration, and intensity of an exercise routine, importance of utilizing pulse oximetry during exercise, safety while exercising, and options of places to exercise outside of rehab.     Pulmonary Medications:  -Verbally interactive group education provided by instructor with focus on inhaled medications and proper administration.   Anatomy and Physiology of the Respiratory System and Intimacy:  -Group instruction provided by PowerPoint, verbal discussion, and written material to support subject matter. Instructor reviews respiratory cycle and anatomical components of the respiratory system and their functions. Instructor also reviews differences in obstructive and restrictive respiratory diseases with examples of each. Intimacy, Sex, and Sexuality differences are reviewed with a discussion on how relationships can change when diagnosed with pulmonary disease. Common sexual concerns are reviewed.   MD DAY -A group question and answer session with a medical doctor that allows participants to ask questions that relate to their pulmonary disease state.   OTHER EDUCATION -Group or individual verbal, written, or  video  instructions that support the educational goals of the pulmonary rehab program.   Holiday Eating Survival Tips:  -Group instruction provided by PowerPoint slides, verbal discussion, and written materials to support subject matter. The instructor gives patients tips, tricks, and techniques to help them not only survive but enjoy the holidays despite the onslaught of food that accompanies the holidays.   Knowledge Questionnaire Score: Knowledge Questionnaire Score - 09/01/18 1523      Knowledge Questionnaire Score   Pre Score  12/18       Core Components/Risk Factors/Patient Goals at Admission: Personal Goals and Risk Factors at Admission - 08/23/18 1453      Core Components/Risk Factors/Patient Goals on Admission    Weight Management  Yes;Weight Maintenance    Intervention  Weight Management/Obesity: Establish reasonable short term and long term weight goals.;Weight Management: Provide education and appropriate resources to help participant work on and attain dietary goals.;Weight Management: Develop a combined nutrition and exercise program designed to reach desired caloric intake, while maintaining appropriate intake of nutrient and fiber, sodium and fats, and appropriate energy expenditure required for the weight goal.    Admit Weight  209 lb 7 oz (95 kg)    Expected Outcomes  Weight Maintenance: Understanding of the daily nutrition guidelines, which includes 25-35% calories from fat, 7% or less cal from saturated fats, less than 211m cholesterol, less than 1.5gm of sodium, & 5 or more servings of fruits and vegetables daily;Short Term: Continue to assess and modify interventions until short term weight is achieved;Long Term: Adherence to nutrition and physical activity/exercise program aimed toward attainment of established weight goal    Tobacco Cessation  Yes    Number of packs per day  1-2 cigarettes a day    Intervention  Assist the participant in steps to quit. Provide  individualized education and counseling about committing to Tobacco Cessation, relapse prevention, and pharmacological support that can be provided by physician.;OAdvice worker assist with locating and accessing local/national Quit Smoking programs, and support quit date choice.    Expected Outcomes  Short Term: Will demonstrate readiness to quit, by selecting a quit date.;Long Term: Complete abstinence from all tobacco products for at least 12 months from quit date.;Short Term: Will quit all tobacco product use, adhering to prevention of relapse plan.    Improve shortness of breath with ADL's  Yes    Intervention  Provide education, individualized exercise plan and daily activity instruction to help decrease symptoms of SOB with activities of daily living.    Expected Outcomes  Short Term: Improve cardiorespiratory fitness to achieve a reduction of symptoms when performing ADLs;Long Term: Be able to perform more ADLs without symptoms or delay the onset of symptoms    Diabetes  Yes    Intervention  Provide education about signs/symptoms and action to take for hypo/hyperglycemia.;Provide education about proper nutrition, including hydration, and aerobic/resistive exercise prescription along with prescribed medications to achieve blood glucose in normal ranges: Fasting glucose 65-99 mg/dL    Expected Outcomes  Short Term: Participant verbalizes understanding of the signs/symptoms and immediate care of hyper/hypoglycemia, proper foot care and importance of medication, aerobic/resistive exercise and nutrition plan for blood glucose control.;Long Term: Attainment of HbA1C < 7%.    Hypertension  Yes    Intervention  Provide education on lifestyle modifcations including regular physical activity/exercise, weight management, moderate sodium restriction and increased consumption of fresh fruit, vegetables, and low fat dairy, alcohol moderation, and smoking cessation.;Monitor prescription use  compliance.  Expected Outcomes  Short Term: Continued assessment and intervention until BP is < 140/33m HG in hypertensive participants. < 130/877mHG in hypertensive participants with diabetes, heart failure or chronic kidney disease.;Long Term: Maintenance of blood pressure at goal levels.    Lipids  Yes    Intervention  Provide education and support for participant on nutrition & aerobic/resistive exercise along with prescribed medications to achieve LDL <7039mHDL >57m44m  Expected Outcomes  Short Term: Participant states understanding of desired cholesterol values and is compliant with medications prescribed. Participant is following exercise prescription and nutrition guidelines.;Long Term: Cholesterol controlled with medications as prescribed, with individualized exercise RX and with personalized nutrition plan. Value goals: LDL < 70mg68mL > 40 mg.    Stress  Yes    Intervention  Offer individual and/or small group education and counseling on adjustment to heart disease, stress management and health-related lifestyle change. Teach and support self-help strategies.;Refer participants experiencing significant psychosocial distress to appropriate mental health specialists for further evaluation and treatment. When possible, include family members and significant others in education/counseling sessions.    Expected Outcomes  Short Term: Participant demonstrates changes in health-related behavior, relaxation and other stress management skills, ability to obtain effective social support, and compliance with psychotropic medications if prescribed.;Long Term: Emotional wellbeing is indicated by absence of clinically significant psychosocial distress or social isolation.       Core Components/Risk Factors/Patient Goals Review:  Goals and Risk Factor Review    Row Name 09/01/18 1514 09/29/18 1655           Core Components/Risk Factors/Patient Goals Review   Personal Goals Review  Weight  Management/Obesity;Develop more efficient breathing techniques such as purse lipped breathing and diaphragmatic breathing and practicing self-pacing with activity.;Stress;Tobacco Cessation;Increase knowledge of respiratory medications and ability to use respiratory devices properly.;Hypertension;Diabetes;Lipids;Improve shortness of breath with ADL's  Weight Management/Obesity;Develop more efficient breathing techniques such as purse lipped breathing and diaphragmatic breathing and practicing self-pacing with activity.;Stress;Tobacco Cessation;Increase knowledge of respiratory medications and ability to use respiratory devices properly.;Hypertension;Diabetes;Lipids;Improve shortness of breath with ADL's      Review  Pt has completed 1 exercise session.  Unable to adequately assess pt progress toward goals.  Anticipate due to his determination he will show progress during the next 30 day assessment.  Pt has completed 6 exercise session. Pt weight elevated from his begining exercise session. Pt is to meet with the dietician for meal plan. Pt continues to develop his PLB technique.  will move pt toward more independence. Pt bp remain stable and within normal limits. Will resolve this as a goal.  Pt reports that his lipids are well mangaged with his current statin therapy.  Will resolve this goal.  Pt stress remains an issue.  Pt desires to do much more than he realistically is able to do at this point.  This is stressful for pt.  Will continue to work on pacing his activities.  Pt blood glucose checks are stable will encourage pt to have more protein in his morning meal.   Pt workloads show average of 5 laps around the track, nustep increased to level 3 and arm crank remains level 2.  Anticipate due to his determination he will show progress during the next 30 day assessment.       Expected Outcomes  See Admission Goals/Outcomes  See Admission Goals/Outcomes         Core Components/Risk Factors/Patient Goals at  Discharge (Final Review):  Goals and Risk Factor Review -  09/29/18 1655      Core Components/Risk Factors/Patient Goals Review   Personal Goals Review  Weight Management/Obesity;Develop more efficient breathing techniques such as purse lipped breathing and diaphragmatic breathing and practicing self-pacing with activity.;Stress;Tobacco Cessation;Increase knowledge of respiratory medications and ability to use respiratory devices properly.;Hypertension;Diabetes;Lipids;Improve shortness of breath with ADL's    Review  Pt has completed 6 exercise session. Pt weight elevated from his begining exercise session. Pt is to meet with the dietician for meal plan. Pt continues to develop his PLB technique.  will move pt toward more independence. Pt bp remain stable and within normal limits. Will resolve this as a goal.  Pt reports that his lipids are well mangaged with his current statin therapy.  Will resolve this goal.  Pt stress remains an issue.  Pt desires to do much more than he realistically is able to do at this point.  This is stressful for pt.  Will continue to work on pacing his activities.  Pt blood glucose checks are stable will encourage pt to have more protein in his morning meal.   Pt workloads show average of 5 laps around the track, nustep increased to level 3 and arm crank remains level 2.  Anticipate due to his determination he will show progress during the next 30 day assessment.     Expected Outcomes  See Admission Goals/Outcomes       ITP Comments: ITP Comments    Row Name 08/23/18 1422 09/01/18 1512         ITP Comments  Dr. Manfred Arch, Medical Director Pulmonary Rehab  Dr. Manfred Arch, Medical Director Pulmonary Rehab         Comments: Pt completed 6 exercise sessions.  Continue to monitor progress.Cherre Huger, BSN Cardiac and Training and development officer

## 2018-09-30 ENCOUNTER — Encounter (HOSPITAL_COMMUNITY): Payer: No Typology Code available for payment source

## 2018-10-05 ENCOUNTER — Encounter (HOSPITAL_COMMUNITY): Payer: No Typology Code available for payment source

## 2018-10-07 ENCOUNTER — Encounter (HOSPITAL_COMMUNITY)
Admission: RE | Admit: 2018-10-07 | Discharge: 2018-10-07 | Disposition: A | Discharge status: Home / Self Care | Payer: No Typology Code available for payment source | Source: Ambulatory Visit | Attending: Pulmonary Disease | Admitting: Pulmonary Disease

## 2018-10-07 DIAGNOSIS — J449 Chronic obstructive pulmonary disease, unspecified: Secondary | ICD-10-CM

## 2018-10-12 ENCOUNTER — Encounter (HOSPITAL_COMMUNITY): Payer: No Typology Code available for payment source

## 2018-10-14 ENCOUNTER — Encounter (HOSPITAL_COMMUNITY): Payer: No Typology Code available for payment source

## 2018-10-14 ENCOUNTER — Encounter (HOSPITAL_COMMUNITY): Payer: Non-veteran care

## 2018-10-19 ENCOUNTER — Encounter (HOSPITAL_COMMUNITY): Payer: Non-veteran care

## 2018-10-20 ENCOUNTER — Telehealth (HOSPITAL_COMMUNITY): Payer: Self-pay | Admitting: *Deleted

## 2018-10-20 NOTE — Telephone Encounter (Signed)
Called to inquire how pt was feeling and plans to return to pulmonary rehab.  Left message with contact information.  Requested call back. Cherre Huger, BSN Cardiac and Training and development officer

## 2018-10-21 ENCOUNTER — Encounter (HOSPITAL_COMMUNITY): Payer: Non-veteran care

## 2018-10-26 ENCOUNTER — Encounter (HOSPITAL_COMMUNITY): Payer: Non-veteran care

## 2018-10-27 NOTE — Progress Notes (Signed)
Pulmonary Individual Treatment Plan  Patient Details  Name: Wesley [name] MRN: [id number] Date of Birth: [birth year] Referring Provider:     Pulmonary Rehab Walk Test from 08/26/2018 in [facility]  Referring Provider  Dr. Nelda Marseille      Initial Encounter Date:    Pulmonary Rehab Walk Test from 08/26/2018 in [facility]  Date  08/26/18      Visit Diagnosis: Chronic obstructive pulmonary disease, unspecified COPD type ([place])  Patient's Home Medications on Admission:   Current Outpatient Medications:  .  albuterol (PROVENTIL HFA;VENTOLIN HFA) 108 (90 Base) MCG/ACT inhaler, Inhale 2 puffs into the lungs every 6 (six) hours as needed for wheezing or shortness of breath., Disp: 1 Inhaler, Rfl: 2 .  aspirin 325 MG tablet, Take 325 mg by mouth daily., Disp: , Rfl:  .  atorvastatin (LIPITOR) 80 MG tablet, Take 1 tablet (80 mg total) by mouth daily at 6 PM., Disp: 30 tablet, Rfl: 1 .  Blood Glucose Monitoring Suppl (ACCU-CHEK AVIVA PLUS) w/Device KIT, 1 Device by Does not apply route 4 (four) times daily., Disp: 1 kit, Rfl: 0 .  Brimonidine-Dorzolamide 0.15-2 % SOLN, Place 1 drop into both eyes 2 (two) times daily., Disp: , Rfl:  .  carboxymethylcellulose (REFRESH PLUS) 0.5 % SOLN, Place 1 drop into both eyes at bedtime as needed (for irritation). , Disp: , Rfl:  .  cetirizine (ZYRTEC) 10 MG tablet, Take 1 tablet (10 mg total) by mouth daily., Disp: 30 tablet, Rfl: 1 .  Cholecalciferol (VITAMIN D3) 2000 UNITS TABS, Take 2,000 Units by mouth daily., Disp: 30 tablet, Rfl: 11 .  diclofenac sodium (VOLTAREN) 1 % GEL, Apply 4 g topically 4 (four) times daily. TO AFFECTED AREAS, Disp: , Rfl:  .  Elastic Bandages & Supports (WRIST SPLINT/ELASTIC LEFT LG) MISC, 1 each by Does not apply route daily., Disp: 1 each, Rfl: 0 .  ENSURE PLUS (ENSURE PLUS) LIQD, Take 237 mLs by mouth 2 (two) times daily between meals., Disp: , Rfl:  .  finasteride  (PROSCAR) 5 MG tablet, Take 5 mg by mouth daily., Disp: , Rfl:  .  fluticasone (FLONASE) 50 MCG/ACT nasal spray, Place 2 sprays into both nostrils daily., Disp: , Rfl:  .  gabapentin (NEURONTIN) 300 MG capsule, Take 1 capsule (300 mg total) by mouth 2 (two) times daily. (Patient taking differently: Take 300 mg by mouth 3 (three) times daily. ), Disp: 30 capsule, Rfl: 0 .  glucose blood (ACCU-CHEK AVIVA) test strip, Use as instructed, Disp: 100 each, Rfl: 12 .  Incontinence Supply Disposable (PADSORBER BED PAN LINERS) MISC, 1 each by Does not apply route daily. (Patient not taking: Reported on 08/23/2018), Disp: 50 each, Rfl: 0 .  insulin glargine (LANTUS) 100 UNIT/ML injection, Inject 30 Units into the skin at bedtime. , Disp: , Rfl:  .  latanoprost (XALATAN) 0.005 % ophthalmic solution, Place 1 drop into both eyes at bedtime., Disp: , Rfl:  .  levETIRAcetam (KEPPRA) 500 MG tablet, Take 2 tablets (1,000 mg total) by mouth 2 (two) times daily. (Patient taking differently: Take 1,500 mg by mouth 2 (two) times daily. ), Disp: , Rfl:  .  lisinopril (PRINIVIL,ZESTRIL) 10 MG tablet, Take 1 tablet (10 mg total) by mouth daily., Disp: 90 tablet, Rfl: 3 .  meloxicam (MOBIC) 15 MG tablet, Take 15 mg by mouth daily as needed for pain., Disp: , Rfl:  .  metFORMIN (GLUCOPHAGE) 1000 MG tablet, Take 1  tablet (1,000 mg total) by mouth 2 (two) times daily with a meal., Disp: 60 tablet, Rfl: 0 .  Misc. Devices ([place]) MISC, 1 each by Does not apply route daily as needed., Disp: 1 each, Rfl: 0 .  Misc. Devices (QUAD CANE) MISC, 1 each by Does not apply route as needed., Disp: 1 each, Rfl: 0 .  pantoprazole (PROTONIX) 40 MG tablet, Take 1 tablet (40 mg total) by mouth daily. (Patient taking differently: Take 40 mg by mouth daily before breakfast. ), Disp: 30 tablet, Rfl: 5 .  polyethylene glycol (MIRALAX / GLYCOLAX) packet, Take 17 g by mouth daily. (Patient taking differently: Take 17 g by mouth daily  as needed for mild constipation. ), Disp: 14 each, Rfl: 0 .  protein supplement shake (PREMIER PROTEIN) LIQD, Take 325 mLs (11 oz total) by mouth 2 (two) times daily between meals., Disp: 7 Can, Rfl: 0 .  tamsulosin (FLOMAX) 0.4 MG CAPS capsule, Take 1 capsule (0.4 mg total) by mouth daily after supper., Disp: 30 capsule, Rfl: 0 .  Tiotropium Bromide-Olodaterol (STIOLTO RESPIMAT) 2.5-2.5 MCG/ACT AERS, Inhale 2 puffs into the lungs daily., Disp: , Rfl:  .  UNKNOWN TO PATIENT, Unnamed nebulizer solution: Nebulize 1 vial every 4-6 hours as needed for shortness of breath, Disp: , Rfl:  .  venlafaxine XR (EFFEXOR-XR) 150 MG 24 hr capsule, Take 150 mg by mouth daily with breakfast., Disp: , Rfl:  .  vitamin B-12 (CYANOCOBALAMIN) 500 MCG tablet, Take 500 mcg by mouth daily., Disp: , Rfl:   Past Medical History: Past Medical History:  Diagnosis Date  . Arthritis    knees  . Blood transfusion without reported diagnosis    during pacemaker surger  . BPH (benign prostatic hyperplasia)   . Cataract    bilateral  . Chronic insomnia 03/31/2016  . COPD (chronic obstructive pulmonary disease) ([place])   . Hyperlipidemia   . Hypertension   . Left-sided weakness 08/28/2016  . Myocardial infarction ([place])   . Pacemaker   . Shortness of breath   . Stroke ([place])   . Substance abuse ([place])   . Tremor, essential 03/31/2016  . Tremors of nervous system   . Tuberculosis    2000    Tobacco Use: Social History   Tobacco Use  Smoking Status Light Tobacco Smoker  . Packs/day: 0.10  . Years: 40.00  . Pack years: 4.00  . Types: Cigarettes  . Last attempt to quit: 08/06/2016  . Years since quitting: 2.2  Smokeless Tobacco Never Used  Tobacco Comment   pt still smokes every now and again 1-2 cigarettes    Labs: Recent Review Flowsheet Data    Labs for ITP Cardiac and Pulmonary Rehab Latest Ref Rng & Units 05/19/2017 05/20/2017 07/03/2017 02/21/2018 06/12/2018   Cholestrol 0 - 200 mg/dL - - - - 197   LDLCALC 0 -  99 mg/dL - - - - 126(H)   HDL >40 mg/dL - - - - 51   Trlycerides <150 mg/dL - - - - 101   Hemoglobin A1c 4.8 - 5.6 % - - 6.9 9.4(H) 6.5(H)   PHART 7.350 - 7.450 - 7.391 - - -   PCO2ART 32.0 - 48.0 mmHg - 39.9 - - -   HCO3 20.0 - 28.0 mmol/L - 23.4 - - -   TCO2 0 - 100 mmol/L 26 - - - -   ACIDBASEDEF 0.0 - 2.0 mmol/L - 0.6 - - -   O2SAT % - 92.6 - - -  Capillary Blood Glucose: Lab Results  Component Value Date   GLUCAP 72 09/28/2018   GLUCAP 61 (L) 09/28/2018   GLUCAP 132 (H) 09/14/2018   GLUCAP 102 (H) 09/14/2018   GLUCAP 151 (H) 06/13/2018   POCT Glucose    Row Name 08/31/18 1259 09/14/18 1601 09/28/18 1536         POCT Blood Glucose   Pre-Exercise  206 mg/dL  102 mg/dL  95 mg/dL     Post-Exercise  143 mg/dL  127 mg/dL given snack before exercise  95 mg/dL        Pulmonary Assessment Scores: Pulmonary Assessment Scores    Row Name 08/26/18 1622 09/01/18 1523       ADL UCSD   ADL Phase  Entry  Entry    SOB Score total  -  69      CAT Score   CAT Score  -  24      mMRC Score   mMRC Score  3  -       Pulmonary Function Assessment:   Exercise Target Goals: Exercise Program Goal: Individual exercise prescription set using results from initial 6 min walk test and THRR while considering  patient's activity barriers and safety.   Exercise Prescription Goal: Initial exercise prescription builds to 30-45 minutes a day of aerobic activity, 2-3 days per week.  Home exercise guidelines will be given to patient during program as part of exercise prescription that the participant will acknowledge.  Activity Barriers & Risk Stratification: Activity Barriers & Cardiac Risk Stratification - 08/23/18 1443      Activity Barriers & Cardiac Risk Stratification   Activity Barriers  Balance Concerns;History of Falls;Assistive Device;Deconditioning;Back Problems;Muscular Weakness;Shortness of Breath;Arthritis;Joint Problems       6 Minute Walk: 6 Minute Walk    Row  Name 08/26/18 1618         6 Minute Walk   Phase  Initial     Distance  600 feet     Walk Time  - 5 minutes     # of Rest Breaks  1 1 minute     MPH  1.13     METS  1.84     RPE  12     Perceived Dyspnea   2     Symptoms  Yes (comment)     Comments  used rollator/numbness in feet     Resting HR  60 bpm     Resting BP  122/80     Resting Oxygen Saturation   94 %     Exercise Oxygen Saturation  during 6 min walk  92 %     Max Ex. HR  79 bpm     Max Ex. BP  120/74       Interval HR   1 Minute HR  74     2 Minute HR  75     3 Minute HR  74     4 Minute HR  79     5 Minute HR  78     6 Minute HR  76     2 Minute Post HR  80     Interval Heart Rate?  Yes       Interval Oxygen   Interval Oxygen?  Yes     Baseline Oxygen Saturation %  94 %     1 Minute Oxygen Saturation %  92 %     1 Minute Liters of Oxygen  0 L  2 Minute Oxygen Saturation %  93 %     2 Minute Liters of Oxygen  0 L     3 Minute Oxygen Saturation %  94 %     3 Minute Liters of Oxygen  0 L     4 Minute Oxygen Saturation %  95 %     4 Minute Liters of Oxygen  0 L     5 Minute Oxygen Saturation %  96 %     5 Minute Liters of Oxygen  0 L     6 Minute Oxygen Saturation %  95 %     6 Minute Liters of Oxygen  0 L     2 Minute Post Oxygen Saturation %  95 %     2 Minute Post Liters of Oxygen  0 L        Oxygen Initial Assessment: Oxygen Initial Assessment - 08/26/18 1621      Initial 6 min Walk   Oxygen Used  None      Program Oxygen Prescription   Program Oxygen Prescription  None       Oxygen Re-Evaluation: Oxygen Re-Evaluation    Row Name 09/28/18 1544 10/27/18 0713           Program Oxygen Prescription   Program Oxygen Prescription  None  None        Home Oxygen   Home Oxygen Device  None  None      Sleep Oxygen Prescription  CPAP  CPAP      Liters per minute  0  0      Home Exercise Oxygen Prescription  None  None      Home at Rest Exercise Oxygen Prescription  None  None         Goals/Expected Outcomes   Short Term Goals  -  To learn and understand importance of maintaining oxygen saturations>88%;To learn and demonstrate proper use of respiratory medications;To learn and understand importance of monitoring SPO2 with pulse oximeter and demonstrate accurate use of the pulse oximeter.;To learn and demonstrate proper pursed lip breathing techniques or other breathing techniques.      Long  Term Goals  -  Verbalizes importance of monitoring SPO2 with pulse oximeter and return demonstration;Maintenance of O2 saturations>88%;Exhibits proper breathing techniques, such as pursed lip breathing or other method taught during program session;Compliance with respiratory medication;Demonstrates proper use of MDI's         Oxygen Discharge (Final Oxygen Re-Evaluation): Oxygen Re-Evaluation - 10/27/18 0713      Program Oxygen Prescription   Program Oxygen Prescription  None      Home Oxygen   Home Oxygen Device  None    Sleep Oxygen Prescription  CPAP    Liters per minute  0    Home Exercise Oxygen Prescription  None    Home at Rest Exercise Oxygen Prescription  None      Goals/Expected Outcomes   Short Term Goals  To learn and understand importance of maintaining oxygen saturations>88%;To learn and demonstrate proper use of respiratory medications;To learn and understand importance of monitoring SPO2 with pulse oximeter and demonstrate accurate use of the pulse oximeter.;To learn and demonstrate proper pursed lip breathing techniques or other breathing techniques.    Long  Term Goals  Verbalizes importance of monitoring SPO2 with pulse oximeter and return demonstration;Maintenance of O2 saturations>88%;Exhibits proper breathing techniques, such as pursed lip breathing or other method taught during program session;Compliance with respiratory medication;Demonstrates proper use of MDI's  Initial Exercise Prescription: Initial Exercise Prescription - 08/26/18 1600      Date  of Initial Exercise RX and Referring Provider   Date  08/26/18    Referring Provider  Dr. Nelda Marseille      NuStep   Level  2    SPM  80    Minutes  17    METs  1.5      Arm Ergometer   Level  2    Watts  10    Minutes  17      Track   Laps  5    Minutes  17      Prescription Details   Frequency (times per week)  2    Duration  Progress to 45 minutes of aerobic exercise without signs/symptoms of physical distress      Intensity   THRR 40-80% of Max Heartrate  63-126    Ratings of Perceived Exertion  11-13    Perceived Dyspnea  0-4      Progression   Progression  Continue to progress workloads to maintain intensity without signs/symptoms of physical distress.      Resistance Training   Training Prescription  Yes    Weight  blue bands    Reps  10-15       Perform Capillary Blood Glucose checks as needed.  Exercise Prescription Changes: Exercise Prescription Changes    Row Name 08/31/18 1300 09/14/18 1500 09/28/18 1500         Response to Exercise   Blood Pressure (Admit)  122/70  138/86  110/72     Blood Pressure (Exercise)  132/82  124/80  110/80     Blood Pressure (Exit)  130/84  112/78  110/64     Heart Rate (Admit)  82 bpm  85 bpm  85 bpm     Heart Rate (Exercise)  106 bpm  74 bpm  84 bpm     Heart Rate (Exit)  60 bpm  80 bpm  90 bpm     Oxygen Saturation (Admit)  98 %  97 %  98 %     Oxygen Saturation (Exercise)  95 %  98 %  96 %     Oxygen Saturation (Exit)  98 %  95 %  100 %     Rating of Perceived Exertion (Exercise)  '14  13  15     ' Perceived Dyspnea (Exercise)  '3  2  2     ' Duration  Progress to 45 minutes of aerobic exercise without signs/symptoms of physical distress  Progress to 45 minutes of aerobic exercise without signs/symptoms of physical distress  Progress to 45 minutes of aerobic exercise without signs/symptoms of physical distress     Intensity  Other (comment) 40-80% of HRR  Other (comment) 40-80% of HRR  Other (comment) 40-80% of HRR        Progression   Progression  Continue to progress workloads to maintain intensity without signs/symptoms of physical distress.  Continue to progress workloads to maintain intensity without signs/symptoms of physical distress.  Continue to progress workloads to maintain intensity without signs/symptoms of physical distress.       Resistance Training   Training Prescription  Yes  Yes  Yes     Weight  blue bands  blue bands  blue bands     Reps  10-15  10-15  10-15     Time  10 Minutes  10 Minutes  10 Minutes  NuStep   Level  '2  3  3     ' SPM  80  80  80     Minutes  17  45  30     METs  1.8  2.4  2.5       Arm Ergometer   Level  2  -  -     Watts  10  -  -     Minutes  17  -  -       Track   Laps  4  -  5     Minutes  17  -  17        Exercise Comments:   Exercise Goals and Review: Exercise Goals    Row Name 08/23/18 1444             Exercise Goals   Increase Physical Activity  Yes       Intervention  Provide advice, education, support and counseling about physical activity/exercise needs.;Develop an individualized exercise prescription for aerobic and resistive training based on initial evaluation findings, risk stratification, comorbidities and participant's personal goals.       Expected Outcomes  Short Term: Attend rehab on a regular basis to increase amount of physical activity.;Long Term: Exercising regularly at least 3-5 days a week.;Long Term: Add in home exercise to make exercise part of routine and to increase amount of physical activity.       Increase Strength and Stamina  Yes       Intervention  Provide advice, education, support and counseling about physical activity/exercise needs.;Develop an individualized exercise prescription for aerobic and resistive training based on initial evaluation findings, risk stratification, comorbidities and participant's personal goals.       Expected Outcomes  Short Term: Increase workloads from initial exercise prescription  for resistance, speed, and METs.;Short Term: Perform resistance training exercises routinely during rehab and add in resistance training at home;Long Term: Improve cardiorespiratory fitness, muscular endurance and strength as measured by increased METs and functional capacity (6MWT)       Able to understand and use rate of perceived exertion (RPE) scale  Yes       Intervention  Provide education and explanation on how to use RPE scale       Expected Outcomes  Short Term: Able to use RPE daily in rehab to express subjective intensity level;Long Term:  Able to use RPE to guide intensity level when exercising independently       Able to understand and use Dyspnea scale  Yes       Intervention  Provide education and explanation on how to use Dyspnea scale       Expected Outcomes  Short Term: Able to use Dyspnea scale daily in rehab to express subjective sense of shortness of breath during exertion;Long Term: Able to use Dyspnea scale to guide intensity level when exercising independently       Knowledge and understanding of Target Heart Rate Range (THRR)  Yes       Intervention  Provide education and explanation of THRR including how the numbers were predicted and where they are located for reference       Expected Outcomes  Short Term: Able to state/look up THRR;Long Term: Able to use THRR to govern intensity when exercising independently;Short Term: Able to use daily as guideline for intensity in rehab       Understanding of Exercise Prescription  Yes       Intervention  Provide  education, explanation, and written materials on patient's individual exercise prescription       Expected Outcomes  Short Term: Able to explain program exercise prescription;Long Term: Able to explain home exercise prescription to exercise independently          Exercise Goals Re-Evaluation : Exercise Goals Re-Evaluation    Row Name 09/28/18 1544 10/27/18 0714           Exercise Goal Re-Evaluation   Exercise Goals  Review  Increase Physical Activity;Increase Strength and Stamina;Able to understand and use rate of perceived exertion (RPE) scale;Able to understand and use Dyspnea scale;Knowledge and understanding of Target Heart Rate Range (THRR);Understanding of Exercise Prescription  Increase Physical Activity;Increase Strength and Stamina;Able to understand and use rate of perceived exertion (RPE) scale;Able to understand and use Dyspnea scale;Knowledge and understanding of Target Heart Rate Range (THRR);Understanding of Exercise Prescription      Comments  Patient is progressing slowly. Barriers are leg pain and stability. Patient's MET level places him in a low to moderate level (2.5). Is able to walk 4 laps (200 ft) in 15 minutes. Patient went to the ED today with extreme leg pain. Will cont to monitor and progress patient as able.  Pt last came to a session on 10/07/2018, in which the pt left upon arrival due to pain. Pt went to the ED due to leg pain that he reported was the worst that he had ever felt. If pt returns to the program we will monitor and progress as able.       Expected Outcomes  Through exercise at rehab and at home, patient will increase functional ability, have a decrease in sob with ADL's, and establish an exercise regimine at home.  Through exercise at rehab and at home, patient will increase functional ability, have a decrease in sob with ADL's, and establish an exercise regimine at home.         Discharge Exercise Prescription (Final Exercise Prescription Changes): Exercise Prescription Changes - 09/28/18 1500      Response to Exercise   Blood Pressure (Admit)  110/72    Blood Pressure (Exercise)  110/80    Blood Pressure (Exit)  110/64    Heart Rate (Admit)  85 bpm    Heart Rate (Exercise)  84 bpm    Heart Rate (Exit)  90 bpm    Oxygen Saturation (Admit)  98 %    Oxygen Saturation (Exercise)  96 %    Oxygen Saturation (Exit)  100 %    Rating of Perceived Exertion (Exercise)  15      Perceived Dyspnea (Exercise)  2    Duration  Progress to 45 minutes of aerobic exercise without signs/symptoms of physical distress    Intensity  Other (comment)   40-80% of HRR     Progression   Progression  Continue to progress workloads to maintain intensity without signs/symptoms of physical distress.      Resistance Training   Training Prescription  Yes    Weight  blue bands    Reps  10-15    Time  10 Minutes      NuStep   Level  3    SPM  80    Minutes  30    METs  2.5      Track   Laps  5    Minutes  17       Nutrition:  Target Goals: Understanding of nutrition guidelines, daily intake of sodium <1578m, cholesterol <2010m calories 30%  from fat and 7% or less from saturated fats, daily to have 5 or more servings of fruits and vegetables.  Biometrics:    Nutrition Therapy Plan and Nutrition Goals:   Nutrition Assessments:   Nutrition Goals Re-Evaluation:   Nutrition Goals Discharge (Final Nutrition Goals Re-Evaluation):   Psychosocial: Target Goals: Acknowledge presence or absence of significant depression and/or stress, maximize coping skills, provide positive support system. Participant is able to verbalize types and ability to use techniques and skills needed for reducing stress and depression.  Initial Review & Psychosocial Screening: Initial Psych Review & Screening - 08/23/18 1448      Initial Review   Current issues with  Current Depression;Current Stress Concerns    Source of Stress Concerns  Unable to perform yard/household activities;Transportation;Unable to participate in former interests or hobbies      [place]?  Yes    Comments  supportive wife and son      Barriers   Psychosocial barriers to participate in program  The patient should benefit from training in stress management and relaxation.      Screening Interventions   Expected Outcomes  Long Term goal: The participant improves quality of Life and  PHQ9 Scores as seen by post scores and/or verbalization of changes;Short Term goal: Identification and review with participant of any Quality of Life or Depression concerns found by scoring the questionnaire.       Quality of Life Scores:  Scores of 19 and below usually indicate a poorer quality of life in these areas.  A difference of  2-3 points is a clinically meaningful difference.  A difference of 2-3 points in the total score of the Quality of Life Index has been associated with significant improvement in overall quality of life, self-image, physical symptoms, and general health in studies assessing change in quality of life.  PHQ-9: Recent Review Flowsheet Data    Depression screen [facility] 2/9 08/23/2018 03/30/2017 02/16/2017 01/30/2017 01/19/2017   Decreased Interest '2 2 2 1 1   ' Down, Depressed, Hopeless '1 2 2 2 2   ' PHQ - 2 Score '3 4 4 3 3   ' Altered sleeping '1  2 1 2 3   ' Tired, decreased energy '3 2 2 2 2   ' Change in appetite '3  2 2 2 ' 0   Feeling bad or failure about yourself  '1 1 2 1 2   ' Trouble concentrating '1 2 2 2 2   ' Moving slowly or fidgety/restless '1 1 2 2 2   ' Suicidal thoughts 0 0 '1 1 1   ' PHQ-9 Score '13 14 16 15 15   ' Difficult doing work/chores Somewhat difficult - - - -     Interpretation of Total Score  Total Score Depression Severity:  1-4 = Minimal depression, 5-9 = Mild depression, 10-14 = Moderate depression, 15-19 = Moderately severe depression, 20-27 = Severe depression   Psychosocial Evaluation and Intervention: Psychosocial Evaluation - 10/27/18 1151      Psychosocial Evaluation & Interventions   Comments  pt with history of PTSD, high stress level. pt last exercised on 10/10 plan to discharge if no return call.    Expected Outcomes  Pt will demonstrate positive healthy coping skills and improve outlook in life    Continue Psychosocial Services   Follow up required by staff       Psychosocial Re-Evaluation: Psychosocial Re-Evaluation    [place] Name 09/01/18 1512  09/29/18 1654 10/27/18 1152  Psychosocial Re-Evaluation   Current issues with  Current Depression;Current Stress Concerns  Current Depression;Current Stress Concerns  Current Depression;Current Stress Concerns     Comments  Pt has completed 1 exercise session   continue to mange his stress and depression.  continue to mange his stress and depression. unable to assess, pt last exercise session was 10/10.     Expected Outcomes  Pt will report less stress and depression. Pt will utilize his resources through the [place] for PTSD  Pt will report less stress and depression. Pt will utilize his resources through the [place] for [facility]  Pt will report less stress and depression. Pt will utilize his resources through the [place] for PTSD     Interventions  Stress management education;Encouraged to attend Pulmonary Rehabilitation for the exercise;Relaxation education  Stress management education;Encouraged to attend Pulmonary Rehabilitation for the exercise;Relaxation education  Stress management education;Encouraged to attend Pulmonary Rehabilitation for the exercise;Relaxation education     Continue Psychosocial Services   -  -  Follow up required by staff       Initial Review   Source of Stress Concerns  -  Unable to perform yard/household activities;Transportation;Unable to participate in former interests or hobbies  -        Psychosocial Discharge (Final Psychosocial Re-Evaluation): Psychosocial Re-Evaluation - 10/27/18 1152      Psychosocial Re-Evaluation   Current issues with  Current Depression;Current Stress Concerns    Comments  continue to mange his stress and depression. unable to assess, pt last exercise session was 10/10.    Expected Outcomes  Pt will report less stress and depression. Pt will utilize his resources through the [place] for PTSD    Interventions  Stress management education;Encouraged to attend Pulmonary Rehabilitation for the exercise;Relaxation education    Continue Psychosocial  Services   Follow up required by staff       Education: Education Goals: Education classes will be provided on a weekly basis, covering required topics. Participant will state understanding/return demonstration of topics presented.  Learning Barriers/Preferences:   Education Topics: Risk Factor Reduction:  -Group instruction that is supported by a PowerPoint presentation. Instructor discusses the definition of a risk factor, different risk factors for pulmonary disease, and how the heart and lungs work together.     Nutrition for Pulmonary Patient:  -Group instruction provided by PowerPoint slides, verbal discussion, and written materials to support subject matter. The instructor gives an explanation and review of healthy diet recommendations, which includes a discussion on weight management, recommendations for fruit and vegetable consumption, as well as protein, fluid, caffeine, fiber, sodium, sugar, and alcohol. Tips for eating when patients are short of breath are discussed.   Pursed Lip Breathing:  -Group instruction that is supported by demonstration and informational handouts. Instructor discusses the benefits of pursed lip and diaphragmatic breathing and detailed demonstration on how to preform both.     Oxygen Safety:  -Group instruction provided by PowerPoint, verbal discussion, and written material to support subject matter. There is an overview of "What is Oxygen" and "Why do we need it".  Instructor also reviews how to create a safe environment for oxygen use, the importance of using oxygen as prescribed, and the risks of noncompliance. There is a brief discussion on traveling with oxygen and resources the patient may utilize.   Oxygen Equipment:  -Group instruction provided by [facility] Staff utilizing handouts, written materials, and equipment demonstrations.   PULMONARY REHAB OTHER RESPIRATORY from 09/23/2018 in [facility]  CARDIAC REHAB  Date   09/23/18  Educator  Ace Gins  Instruction Review Code  1- Verbalizes Understanding      Signs and Symptoms:  -Group instruction provided by written material and verbal discussion to support subject matter. Warning signs and symptoms of infection, stroke, and heart attack are reviewed and when to call the physician/911 reinforced. Tips for preventing the spread of infection discussed.   PULMONARY REHAB OTHER RESPIRATORY from 09/23/2018 in [place]  Date  09/09/18  Educator  RN      Advanced Directives:  -Group instruction provided by verbal instruction and written material to support subject matter. Instructor reviews Advanced Directive laws and proper instruction for filling out document.   Pulmonary Video:  -Group video education that reviews the importance of medication and oxygen compliance, exercise, good nutrition, pulmonary hygiene, and pursed lip and diaphragmatic breathing for the pulmonary patient.   Exercise for the Pulmonary Patient:  -Group instruction that is supported by a PowerPoint presentation. Instructor discusses benefits of exercise, core components of exercise, frequency, duration, and intensity of an exercise routine, importance of utilizing pulse oximetry during exercise, safety while exercising, and options of places to exercise outside of rehab.     Pulmonary Medications:  -Verbally interactive group education provided by instructor with focus on inhaled medications and proper administration.   Anatomy and Physiology of the Respiratory System and Intimacy:  -Group instruction provided by PowerPoint, verbal discussion, and written material to support subject matter. Instructor reviews respiratory cycle and anatomical components of the respiratory system and their functions. Instructor also reviews differences in obstructive and restrictive respiratory diseases with examples of each. Intimacy, Sex, and Sexuality differences are  reviewed with a discussion on how relationships can change when diagnosed with pulmonary disease. Common sexual concerns are reviewed.   MD DAY -A group question and answer session with a medical doctor that allows participants to ask questions that relate to their pulmonary disease state.   OTHER EDUCATION -Group or individual verbal, written, or video instructions that support the educational goals of the pulmonary rehab program.   Holiday Eating Survival Tips:  -Group instruction provided by PowerPoint slides, verbal discussion, and written materials to support subject matter. The instructor gives patients tips, tricks, and techniques to help them not only survive but enjoy the holidays despite the onslaught of food that accompanies the holidays.   Knowledge Questionnaire Score: Knowledge Questionnaire Score - 09/01/18 1523      Knowledge Questionnaire Score   Pre Score  12/18       Core Components/Risk Factors/Patient Goals at Admission: Personal Goals and Risk Factors at Admission - 08/23/18 1453      Core Components/Risk Factors/Patient Goals on Admission    Weight Management  Yes;Weight Maintenance    Intervention  Weight Management/Obesity: Establish reasonable short term and long term weight goals.;Weight Management: Provide education and appropriate resources to help participant work on and attain dietary goals.;Weight Management: Develop a combined nutrition and exercise program designed to reach desired caloric intake, while maintaining appropriate intake of nutrient and fiber, sodium and fats, and appropriate energy expenditure required for the weight goal.    Admit Weight  209 lb 7 oz (95 kg)    Expected Outcomes  Weight Maintenance: Understanding of the daily nutrition guidelines, which includes 25-35% calories from fat, 7% or less cal from saturated fats, less than 276m cholesterol, less than 1.5gm of sodium, & 5 or more servings of fruits and vegetables daily;Short  Term:  Continue to assess and modify interventions until short term weight is achieved;Long Term: Adherence to nutrition and physical activity/exercise program aimed toward attainment of established weight goal    Tobacco Cessation  Yes    Number of packs per day  1-2 cigarettes a day    Intervention  Assist the participant in steps to quit. Provide individualized education and counseling about committing to Tobacco Cessation, relapse prevention, and pharmacological support that can be provided by physician.;Advice worker, assist with locating and accessing local/national Quit Smoking programs, and support quit date choice.    Expected Outcomes  Short Term: Will demonstrate readiness to quit, by selecting a quit date.;Long Term: Complete abstinence from all tobacco products for at least 12 months from quit date.;Short Term: Will quit all tobacco product use, adhering to prevention of relapse plan.    Improve shortness of breath with ADL's  Yes    Intervention  Provide education, individualized exercise plan and daily activity instruction to help decrease symptoms of SOB with activities of daily living.    Expected Outcomes  Short Term: Improve cardiorespiratory fitness to achieve a reduction of symptoms when performing ADLs;Long Term: Be able to perform more ADLs without symptoms or delay the onset of symptoms    Diabetes  Yes    Intervention  Provide education about signs/symptoms and action to take for hypo/hyperglycemia.;Provide education about proper nutrition, including hydration, and aerobic/resistive exercise prescription along with prescribed medications to achieve blood glucose in normal ranges: Fasting glucose 65-99 mg/dL    Expected Outcomes  Short Term: Participant verbalizes understanding of the signs/symptoms and immediate care of hyper/hypoglycemia, proper foot care and importance of medication, aerobic/resistive exercise and nutrition plan for blood glucose control.;Long  Term: Attainment of HbA1C < 7%.    Hypertension  Yes    Intervention  Provide education on lifestyle modifcations including regular physical activity/exercise, weight management, moderate sodium restriction and increased consumption of fresh fruit, vegetables, and low fat dairy, alcohol moderation, and smoking cessation.;Monitor prescription use compliance.    Expected Outcomes  Short Term: Continued assessment and intervention until BP is < 140/67m HG in hypertensive participants. < 130/815mHG in hypertensive participants with diabetes, heart failure or chronic kidney disease.;Long Term: Maintenance of blood pressure at goal levels.    Lipids  Yes    Intervention  Provide education and support for participant on nutrition & aerobic/resistive exercise along with prescribed medications to achieve LDL <7066mHDL >50m33m  Expected Outcomes  Short Term: Participant states understanding of desired cholesterol values and is compliant with medications prescribed. Participant is following exercise prescription and nutrition guidelines.;Long Term: Cholesterol controlled with medications as prescribed, with individualized exercise RX and with personalized nutrition plan. Value goals: LDL < 70mg76mL > 40 mg.    Stress  Yes    Intervention  Offer individual and/or small group education and counseling on adjustment to heart disease, stress management and health-related lifestyle change. Teach and support self-help strategies.;Refer participants experiencing significant psychosocial distress to appropriate mental health specialists for further evaluation and treatment. When possible, include family members and significant others in education/counseling sessions.    Expected Outcomes  Short Term: Participant demonstrates changes in health-related behavior, relaxation and other stress management skills, ability to obtain effective social support, and compliance with psychotropic medications if prescribed.;Long Term:  Emotional wellbeing is indicated by absence of clinically significant psychosocial distress or social isolation.       Core Components/Risk Factors/Patient Goals Review:  Goals and Risk Factor  Review    Row Name 09/01/18 1514 09/29/18 1655 10/27/18 1152         Core Components/Risk Factors/Patient Goals Review   Personal Goals Review  Weight Management/Obesity;Develop more efficient breathing techniques such as purse lipped breathing and diaphragmatic breathing and practicing self-pacing with activity.;Stress;Tobacco Cessation;Increase knowledge of respiratory medications and ability to use respiratory devices properly.;Hypertension;Diabetes;Lipids;Improve shortness of breath with ADL's  Weight Management/Obesity;Develop more efficient breathing techniques such as purse lipped breathing and diaphragmatic breathing and practicing self-pacing with activity.;Stress;Tobacco Cessation;Increase knowledge of respiratory medications and ability to use respiratory devices properly.;Hypertension;Diabetes;Lipids;Improve shortness of breath with ADL's  Weight Management/Obesity;Develop more efficient breathing techniques such as purse lipped breathing and diaphragmatic breathing and practicing self-pacing with activity.;Stress;Tobacco Cessation;Increase knowledge of respiratory medications and ability to use respiratory devices properly.;Hypertension;Diabetes;Lipids;Improve shortness of breath with ADL's     Review  Pt has completed 1 exercise session.  Unable to adequately assess pt progress toward goals.  Anticipate due to his determination he will show progress during the next 30 day assessment.  Pt has completed 6 exercise session. Pt weight elevated from his begining exercise session. Pt is to meet with the dietician for meal plan. Pt continues to develop his PLB technique.  will move pt toward more independence. Pt bp remain stable and within normal limits. Will resolve this as a goal.  Pt reports that his  lipids are well mangaged with his current statin therapy.  Will resolve this goal.  Pt stress remains an issue.  Pt desires to do much more than he realistically is able to do at this point.  This is stressful for pt.  Will continue to work on pacing his activities.  Pt blood glucose checks are stable will encourage pt to have more protein in his morning meal.   Pt workloads show average of 5 laps around the track, nustep increased to level 3 and arm crank remains level 2.  Anticipate due to his determination he will show progress during the next 30 day assessment.   Pt last exercise session was on 10/10.  pt with a lot of pain and discomfort.  This keeps him from being active.  Unalble to assess progress due to non attendance.  Plan to discharge pt if no response from message left on answering machine.     Expected Outcomes  See Admission Goals/Outcomes  See Admission Goals/Outcomes  See Admission Goals/Outcomes        Core Components/Risk Factors/Patient Goals at Discharge (Final Review):  Goals and Risk Factor Review - 10/27/18 1152      Core Components/Risk Factors/Patient Goals Review   Personal Goals Review  Weight Management/Obesity;Develop more efficient breathing techniques such as purse lipped breathing and diaphragmatic breathing and practicing self-pacing with activity.;Stress;Tobacco Cessation;Increase knowledge of respiratory medications and ability to use respiratory devices properly.;Hypertension;Diabetes;Lipids;Improve shortness of breath with ADL's    Review  Pt last exercise session was on 10/10.  pt with a lot of pain and discomfort.  This keeps him from being active.  Unalble to assess progress due to non attendance.  Plan to discharge pt if no response from message left on answering machine.    Expected Outcomes  See Admission Goals/Outcomes       ITP Comments: ITP Comments    Row Name 08/23/18 1422 09/01/18 1512 10/27/18 1151       ITP Comments  Dr. Manfred Arch, Medical  Director Pulmonary Rehab  Dr. Manfred Arch, Medical Director Pulmonary Rehab  Dr. Manfred Arch, Medical Director  Pulmonary Rehab        Comments: last exercised on 10/10.  Plan to discharge in no response from message left on 10/25/18. Cherre Huger, BSN Cardiac and Training and development officer

## 2018-10-27 NOTE — Addendum Note (Signed)
Encounter addended by: Rowe Pavy, RN on: 10/27/2018 11:56 AM  Actions taken: Flowsheet data copied forward, Visit Navigator Flowsheet section accepted, Sign clinical note

## 2018-10-28 ENCOUNTER — Encounter (HOSPITAL_COMMUNITY): Payer: Non-veteran care

## 2018-11-02 ENCOUNTER — Encounter (HOSPITAL_COMMUNITY): Payer: Non-veteran care

## 2018-11-04 ENCOUNTER — Encounter (HOSPITAL_COMMUNITY): Payer: Non-veteran care

## 2018-11-09 ENCOUNTER — Encounter (HOSPITAL_COMMUNITY): Payer: Non-veteran care

## 2018-11-09 ENCOUNTER — Telehealth (HOSPITAL_COMMUNITY): Payer: Self-pay

## 2018-11-12 NOTE — Addendum Note (Signed)
Encounter addended by: Ivonne Andrew, RD on: 11/12/2018 4:28 PM  Actions taken: Flowsheet data copied forward, Visit Navigator Flowsheet section accepted

## 2018-11-16 ENCOUNTER — Encounter (HOSPITAL_COMMUNITY): Payer: Non-veteran care

## 2018-11-23 ENCOUNTER — Emergency Department (HOSPITAL_COMMUNITY): Payer: No Typology Code available for payment source

## 2018-11-23 ENCOUNTER — Inpatient Hospital Stay (HOSPITAL_COMMUNITY)
Admission: EM | Admit: 2018-11-23 | Discharge: 2018-11-27 | DRG: 193 | Disposition: A | Discharge status: Home Health | Payer: No Typology Code available for payment source | Attending: Internal Medicine | Admitting: Internal Medicine

## 2018-11-23 ENCOUNTER — Encounter (HOSPITAL_COMMUNITY): Payer: Self-pay | Admitting: *Deleted

## 2018-11-23 ENCOUNTER — Other Ambulatory Visit: Payer: Self-pay

## 2018-11-23 DIAGNOSIS — J189 Pneumonia, unspecified organism: Principal | ICD-10-CM | POA: Diagnosis present

## 2018-11-23 DIAGNOSIS — Z83511 Family history of glaucoma: Secondary | ICD-10-CM

## 2018-11-23 DIAGNOSIS — I69354 Hemiplegia and hemiparesis following cerebral infarction affecting left non-dominant side: Secondary | ICD-10-CM

## 2018-11-23 DIAGNOSIS — F32A Depression, unspecified: Secondary | ICD-10-CM | POA: Diagnosis present

## 2018-11-23 DIAGNOSIS — N400 Enlarged prostate without lower urinary tract symptoms: Secondary | ICD-10-CM | POA: Diagnosis present

## 2018-11-23 DIAGNOSIS — E119 Type 2 diabetes mellitus without complications: Secondary | ICD-10-CM | POA: Diagnosis present

## 2018-11-23 DIAGNOSIS — K219 Gastro-esophageal reflux disease without esophagitis: Secondary | ICD-10-CM | POA: Diagnosis present

## 2018-11-23 DIAGNOSIS — H40059 Ocular hypertension, unspecified eye: Secondary | ICD-10-CM | POA: Diagnosis present

## 2018-11-23 DIAGNOSIS — Z87440 Personal history of urinary (tract) infections: Secondary | ICD-10-CM

## 2018-11-23 DIAGNOSIS — R140 Abdominal distension (gaseous): Secondary | ICD-10-CM | POA: Diagnosis not present

## 2018-11-23 DIAGNOSIS — R569 Unspecified convulsions: Secondary | ICD-10-CM | POA: Diagnosis not present

## 2018-11-23 DIAGNOSIS — I2510 Atherosclerotic heart disease of native coronary artery without angina pectoris: Secondary | ICD-10-CM | POA: Diagnosis present

## 2018-11-23 DIAGNOSIS — Z794 Long term (current) use of insulin: Secondary | ICD-10-CM

## 2018-11-23 DIAGNOSIS — F419 Anxiety disorder, unspecified: Secondary | ICD-10-CM | POA: Diagnosis not present

## 2018-11-23 DIAGNOSIS — I10 Essential (primary) hypertension: Secondary | ICD-10-CM | POA: Diagnosis not present

## 2018-11-23 DIAGNOSIS — K567 Ileus, unspecified: Secondary | ICD-10-CM | POA: Diagnosis not present

## 2018-11-23 DIAGNOSIS — F329 Major depressive disorder, single episode, unspecified: Secondary | ICD-10-CM | POA: Diagnosis present

## 2018-11-23 DIAGNOSIS — R109 Unspecified abdominal pain: Secondary | ICD-10-CM | POA: Diagnosis not present

## 2018-11-23 DIAGNOSIS — G9341 Metabolic encephalopathy: Secondary | ICD-10-CM | POA: Diagnosis present

## 2018-11-23 DIAGNOSIS — Z950 Presence of cardiac pacemaker: Secondary | ICD-10-CM

## 2018-11-23 DIAGNOSIS — K5900 Constipation, unspecified: Secondary | ICD-10-CM

## 2018-11-23 DIAGNOSIS — Z7951 Long term (current) use of inhaled steroids: Secondary | ICD-10-CM

## 2018-11-23 DIAGNOSIS — R42 Dizziness and giddiness: Secondary | ICD-10-CM | POA: Diagnosis not present

## 2018-11-23 DIAGNOSIS — R319 Hematuria, unspecified: Secondary | ICD-10-CM | POA: Diagnosis present

## 2018-11-23 DIAGNOSIS — E785 Hyperlipidemia, unspecified: Secondary | ICD-10-CM | POA: Diagnosis present

## 2018-11-23 DIAGNOSIS — I714 Abdominal aortic aneurysm, without rupture, unspecified: Secondary | ICD-10-CM

## 2018-11-23 DIAGNOSIS — I495 Sick sinus syndrome: Secondary | ICD-10-CM | POA: Diagnosis not present

## 2018-11-23 DIAGNOSIS — F17200 Nicotine dependence, unspecified, uncomplicated: Secondary | ICD-10-CM | POA: Diagnosis not present

## 2018-11-23 DIAGNOSIS — Z79899 Other long term (current) drug therapy: Secondary | ICD-10-CM

## 2018-11-23 DIAGNOSIS — E669 Obesity, unspecified: Secondary | ICD-10-CM

## 2018-11-23 DIAGNOSIS — Z880 Allergy status to penicillin: Secondary | ICD-10-CM

## 2018-11-23 DIAGNOSIS — F17210 Nicotine dependence, cigarettes, uncomplicated: Secondary | ICD-10-CM | POA: Diagnosis present

## 2018-11-23 DIAGNOSIS — J181 Lobar pneumonia, unspecified organism: Secondary | ICD-10-CM

## 2018-11-23 DIAGNOSIS — G250 Essential tremor: Secondary | ICD-10-CM | POA: Diagnosis present

## 2018-11-23 DIAGNOSIS — E7800 Pure hypercholesterolemia, unspecified: Secondary | ICD-10-CM | POA: Diagnosis not present

## 2018-11-23 DIAGNOSIS — Z801 Family history of malignant neoplasm of trachea, bronchus and lung: Secondary | ICD-10-CM

## 2018-11-23 DIAGNOSIS — E1169 Type 2 diabetes mellitus with other specified complication: Secondary | ICD-10-CM | POA: Diagnosis not present

## 2018-11-23 DIAGNOSIS — I252 Old myocardial infarction: Secondary | ICD-10-CM

## 2018-11-23 DIAGNOSIS — J440 Chronic obstructive pulmonary disease with acute lower respiratory infection: Secondary | ICD-10-CM | POA: Diagnosis present

## 2018-11-23 DIAGNOSIS — R509 Fever, unspecified: Secondary | ICD-10-CM

## 2018-11-23 DIAGNOSIS — Z881 Allergy status to other antibiotic agents status: Secondary | ICD-10-CM

## 2018-11-23 HISTORY — DX: Pneumonia, unspecified organism: J18.9

## 2018-11-23 LAB — URINALYSIS, ROUTINE W REFLEX MICROSCOPIC
Bilirubin Urine: NEGATIVE
Glucose, UA: >=500 mg/dL — AB
KETONES UR: NEGATIVE mg/dL
Leukocytes, UA: NEGATIVE
Nitrite: NEGATIVE
Protein, ur: 30 mg/dL — AB
RBC / HPF: >50 RBC/hpf — ABNORMAL HIGH (ref 0–5)
SPECIFIC GRAVITY, URINE: 1.015 (ref 1.005–1.030)
pH: 6 (ref 5.0–8.0)

## 2018-11-23 LAB — CBC WITH DIFFERENTIAL/PLATELET
Abs Immature Granulocytes: 0.07 10*3/uL (ref 0.00–0.07)
BASOS PCT: 0 %
Basophils Absolute: 0 10*3/uL (ref 0.0–0.1)
Eosinophils Absolute: 0 10*3/uL (ref 0.0–0.5)
Eosinophils Relative: 0 %
HCT: 42.9 % (ref 39.0–52.0)
Hemoglobin: 13.2 g/dL (ref 13.0–17.0)
Immature Granulocytes: 1 %
Lymphocytes Relative: 7 %
Lymphs Abs: 1 10*3/uL (ref 0.7–4.0)
MCH: 27.3 pg (ref 26.0–34.0)
MCHC: 30.8 g/dL (ref 30.0–36.0)
MCV: 88.8 fL (ref 80.0–100.0)
Monocytes Absolute: 0.6 10*3/uL (ref 0.1–1.0)
Monocytes Relative: 4 %
Neutro Abs: 12.3 10*3/uL — ABNORMAL HIGH (ref 1.7–7.7)
Neutrophils Relative %: 88 %
PLATELETS: 168 10*3/uL (ref 150–400)
RBC: 4.83 MIL/uL (ref 4.22–5.81)
RDW: 14 % (ref 11.5–15.5)
WBC: 14 10*3/uL — ABNORMAL HIGH (ref 4.0–10.5)
nRBC: 0 % (ref 0.0–0.2)

## 2018-11-23 LAB — HEMOGLOBIN A1C
Hgb A1c MFr Bld: 6.7 % — ABNORMAL HIGH (ref 4.8–5.6)
Mean Plasma Glucose: 145.59 mg/dL

## 2018-11-23 LAB — I-STAT CG4 LACTIC ACID, ED
Lactic Acid, Venous: 2.52 mmol/L (ref 0.5–1.9)
Lactic Acid, Venous: 1.49 mmol/L (ref 0.5–1.9)

## 2018-11-23 LAB — COMPREHENSIVE METABOLIC PANEL
ALK PHOS: 108 U/L (ref 38–126)
ALT: 65 U/L — AB (ref 0–44)
AST: 60 U/L — AB (ref 15–41)
Albumin: 4.1 g/dL (ref 3.5–5.0)
Anion gap: 9 (ref 5–15)
BUN: 21 mg/dL (ref 8–23)
CO2: 24 mmol/L (ref 22–32)
Calcium: 9.2 mg/dL (ref 8.9–10.3)
Chloride: 102 mmol/L (ref 98–111)
Creatinine, Ser: 0.98 mg/dL (ref 0.61–1.24)
GFR calc Af Amer: >60 mL/min (ref >60)
Glucose, Bld: 212 mg/dL — ABNORMAL HIGH (ref 70–99)
Potassium: 4.3 mmol/L (ref 3.5–5.1)
Sodium: 135 mmol/L (ref 135–145)
Total Bilirubin: 0.8 mg/dL (ref 0.3–1.2)
Total Protein: 6.8 g/dL (ref 6.5–8.1)

## 2018-11-23 LAB — INFLUENZA PANEL BY PCR (TYPE A & B)
Influenza A By PCR: NEGATIVE
Influenza B By PCR: NEGATIVE

## 2018-11-23 LAB — GLUCOSE, CAPILLARY: Glucose-Capillary: 152 mg/dL — ABNORMAL HIGH (ref 70–99)

## 2018-11-23 MED ORDER — IOPAMIDOL (ISOVUE-300) INJECTION 61%
100.0000 mL | Freq: Once | INTRAVENOUS | Status: AC | PRN
  Administered 2018-11-23: 100 mL via INTRAVENOUS

## 2018-11-23 MED ORDER — SODIUM CHLORIDE 0.9 % IV SOLN
INTRAVENOUS | Status: DC
  Administered 2018-11-23 – 2018-11-26 (×4): via INTRAVENOUS

## 2018-11-23 MED ORDER — TAMSULOSIN HCL 0.4 MG PO CAPS
0.4000 mg | ORAL_CAPSULE | Freq: Every day | ORAL | Status: DC
  Administered 2018-11-23 – 2018-11-27 (×5): 0.4 mg via ORAL
  Filled 2018-11-23 (×5): qty 1

## 2018-11-23 MED ORDER — POLYVINYL ALCOHOL 1.4 % OP SOLN: 1.0000 [drp] | OPHTHALMIC | Status: DC | PRN

## 2018-11-23 MED ORDER — FLUTICASONE PROPIONATE 50 MCG/ACT NA SUSP
2.0000 | Freq: Every day | NASAL | Status: DC
  Administered 2018-11-23 – 2018-11-27 (×4): 2 via NASAL
  Filled 2018-11-23: qty 16

## 2018-11-23 MED ORDER — CARBOXYMETHYLCELLULOSE SODIUM 0.5 % OP SOLN: 1.0000 [drp] | Freq: Every evening | OPHTHALMIC | Status: DC | PRN

## 2018-11-23 MED ORDER — VENLAFAXINE HCL ER 150 MG PO CP24
150.0000 mg | ORAL_CAPSULE | Freq: Every day | ORAL | Status: DC
  Administered 2018-11-24 – 2018-11-27 (×4): 150 mg via ORAL
  Filled 2018-11-23 (×4): qty 1

## 2018-11-23 MED ORDER — ATORVASTATIN CALCIUM 40 MG PO TABS
40.0000 mg | ORAL_TABLET | Freq: Every day | ORAL | Status: DC
  Administered 2018-11-23 – 2018-11-27 (×5): 40 mg via ORAL
  Filled 2018-11-23 (×5): qty 1

## 2018-11-23 MED ORDER — INSULIN ASPART 100 UNIT/ML ~~LOC~~ SOLN: 0.0000 [IU] | Freq: Every day | SUBCUTANEOUS | Status: DC

## 2018-11-23 MED ORDER — SODIUM CHLORIDE 0.9 % IV SOLN
1.0000 g | INTRAVENOUS | Status: DC
  Administered 2018-11-23: 1 g via INTRAVENOUS
  Filled 2018-11-23: qty 10

## 2018-11-23 MED ORDER — SODIUM CHLORIDE 0.9 % IV SOLN
500.0000 mg | INTRAVENOUS | Status: DC
  Administered 2018-11-24 – 2018-11-26 (×3): 500 mg via INTRAVENOUS
  Filled 2018-11-23 (×4): qty 500

## 2018-11-23 MED ORDER — SODIUM CHLORIDE 0.9 % IV SOLN
1.0000 g | INTRAVENOUS | Status: DC
  Administered 2018-11-24 – 2018-11-26 (×3): 1 g via INTRAVENOUS
  Filled 2018-11-23 (×3): qty 1

## 2018-11-23 MED ORDER — ACETAMINOPHEN 500 MG PO TABS
1000.0000 mg | ORAL_TABLET | Freq: Once | ORAL | Status: AC
  Administered 2018-11-23: 1000 mg via ORAL
  Filled 2018-11-23: qty 2

## 2018-11-23 MED ORDER — ASPIRIN EC 325 MG PO TBEC
325.0000 mg | DELAYED_RELEASE_TABLET | Freq: Every day | ORAL | Status: DC
  Administered 2018-11-23 – 2018-11-27 (×5): 325 mg via ORAL
  Filled 2018-11-23 (×5): qty 1

## 2018-11-23 MED ORDER — BUDESONIDE 0.25 MG/2ML IN SUSP
0.2500 mg | Freq: Two times a day (BID) | RESPIRATORY_TRACT | Status: DC
  Administered 2018-11-23 – 2018-11-24 (×2): 0.25 mg via RESPIRATORY_TRACT
  Filled 2018-11-23 (×2): qty 2

## 2018-11-23 MED ORDER — PREMIER PROTEIN SHAKE
11.0000 [oz_av] | Freq: Two times a day (BID) | ORAL | Status: DC
  Administered 2018-11-24 – 2018-11-27 (×5): 11 [oz_av] via ORAL
  Filled 2018-11-23 (×8): qty 325.31

## 2018-11-23 MED ORDER — ENSURE ENLIVE PO LIQD
237.0000 mL | Freq: Two times a day (BID) | ORAL | Status: DC
  Administered 2018-11-24 – 2018-11-27 (×6): 237 mL via ORAL

## 2018-11-23 MED ORDER — MIRABEGRON ER 25 MG PO TB24
25.0000 mg | ORAL_TABLET | Freq: Every day | ORAL | Status: DC
  Administered 2018-11-23 – 2018-11-27 (×5): 25 mg via ORAL
  Filled 2018-11-23 (×5): qty 1

## 2018-11-23 MED ORDER — SODIUM CHLORIDE 0.9 % IV SOLN
500.0000 mg | Freq: Once | INTRAVENOUS | Status: AC
  Administered 2018-11-23: 500 mg via INTRAVENOUS
  Filled 2018-11-23: qty 500

## 2018-11-23 MED ORDER — DORZOLAMIDE HCL 2 % OP SOLN
1.0000 [drp] | Freq: Two times a day (BID) | OPHTHALMIC | Status: DC
  Administered 2018-11-23 – 2018-11-27 (×7): 1 [drp] via OPHTHALMIC
  Filled 2018-11-23: qty 10

## 2018-11-23 MED ORDER — SODIUM CHLORIDE 0.9 % IV BOLUS (SEPSIS)
1000.0000 mL | Freq: Once | INTRAVENOUS | Status: AC
  Administered 2018-11-23: 1000 mL via INTRAVENOUS

## 2018-11-23 MED ORDER — SODIUM CHLORIDE (PF) 0.9 % IJ SOLN
INTRAMUSCULAR | Status: AC
  Filled 2018-11-23: qty 50

## 2018-11-23 MED ORDER — LORATADINE 10 MG PO TABS
10.0000 mg | ORAL_TABLET | Freq: Every day | ORAL | Status: DC
  Administered 2018-11-23 – 2018-11-27 (×5): 10 mg via ORAL
  Filled 2018-11-23 (×5): qty 1

## 2018-11-23 MED ORDER — PANTOPRAZOLE SODIUM 40 MG PO TBEC
40.0000 mg | DELAYED_RELEASE_TABLET | Freq: Every day | ORAL | Status: DC
  Administered 2018-11-23 – 2018-11-25 (×3): 40 mg via ORAL
  Filled 2018-11-23 (×3): qty 1

## 2018-11-23 MED ORDER — VITAMIN D 25 MCG (1000 UNIT) PO TABS
2000.0000 [IU] | ORAL_TABLET | Freq: Every day | ORAL | Status: DC
  Administered 2018-11-23 – 2018-11-27 (×5): 2000 [IU] via ORAL
  Filled 2018-11-23 (×5): qty 2

## 2018-11-23 MED ORDER — LEVETIRACETAM 500 MG PO TABS
750.0000 mg | ORAL_TABLET | Freq: Two times a day (BID) | ORAL | Status: DC
  Administered 2018-11-23 – 2018-11-27 (×8): 750 mg via ORAL
  Filled 2018-11-23 (×9): qty 1

## 2018-11-23 MED ORDER — HYDROCODONE-ACETAMINOPHEN 5-325 MG PO TABS
2.0000 | ORAL_TABLET | Freq: Once | ORAL | Status: AC
  Administered 2018-11-23: 2 via ORAL
  Filled 2018-11-23: qty 2

## 2018-11-23 MED ORDER — MIRTAZAPINE 15 MG PO TABS
7.5000 mg | ORAL_TABLET | Freq: Every day | ORAL | Status: DC
  Administered 2018-11-23 – 2018-11-26 (×4): 7.5 mg via ORAL
  Filled 2018-11-23 (×5): qty 1

## 2018-11-23 MED ORDER — INSULIN GLARGINE 100 UNIT/ML ~~LOC~~ SOLN
16.0000 [IU] | Freq: Every day | SUBCUTANEOUS | Status: DC
  Administered 2018-11-23 – 2018-11-26 (×4): 16 [IU] via SUBCUTANEOUS
  Filled 2018-11-23 (×5): qty 0.16

## 2018-11-23 MED ORDER — INSULIN ASPART 100 UNIT/ML ~~LOC~~ SOLN
0.0000 [IU] | Freq: Three times a day (TID) | SUBCUTANEOUS | Status: DC
  Administered 2018-11-24 – 2018-11-25 (×4): 3 [IU] via SUBCUTANEOUS
  Administered 2018-11-26: 2 [IU] via SUBCUTANEOUS
  Administered 2018-11-26: 3 [IU] via SUBCUTANEOUS
  Administered 2018-11-27: 2 [IU] via SUBCUTANEOUS
  Administered 2018-11-27: 3 [IU] via SUBCUTANEOUS

## 2018-11-23 MED ORDER — LISINOPRIL 10 MG PO TABS
10.0000 mg | ORAL_TABLET | Freq: Every day | ORAL | Status: DC
  Administered 2018-11-23 – 2018-11-27 (×5): 10 mg via ORAL
  Filled 2018-11-23 (×5): qty 1

## 2018-11-23 MED ORDER — SENNA 8.6 MG PO TABS: 1.0000 | ORAL_TABLET | Freq: Every day | ORAL | Status: DC | PRN

## 2018-11-23 MED ORDER — GABAPENTIN 300 MG PO CAPS
300.0000 mg | ORAL_CAPSULE | Freq: Three times a day (TID) | ORAL | Status: DC
  Administered 2018-11-23 – 2018-11-27 (×11): 300 mg via ORAL
  Filled 2018-11-23 (×11): qty 1

## 2018-11-23 MED ORDER — CYANOCOBALAMIN 500 MCG PO TABS
500.0000 ug | ORAL_TABLET | Freq: Every day | ORAL | Status: DC
  Administered 2018-11-23 – 2018-11-27 (×5): 500 ug via ORAL
  Filled 2018-11-23 (×5): qty 1

## 2018-11-23 MED ORDER — IPRATROPIUM-ALBUTEROL 0.5-2.5 (3) MG/3ML IN SOLN: 3.0000 mL | Freq: Four times a day (QID) | RESPIRATORY_TRACT | Status: DC | PRN

## 2018-11-23 MED ORDER — BRIMONIDINE TARTRATE 0.15 % OP SOLN
1.0000 [drp] | Freq: Two times a day (BID) | OPHTHALMIC | Status: DC
  Administered 2018-11-23 – 2018-11-27 (×7): 1 [drp] via OPHTHALMIC
  Filled 2018-11-23: qty 5

## 2018-11-23 MED ORDER — IOPAMIDOL (ISOVUE-300) INJECTION 61%
INTRAVENOUS | Status: AC
  Filled 2018-11-23: qty 100

## 2018-11-23 MED ORDER — FINASTERIDE 5 MG PO TABS
5.0000 mg | ORAL_TABLET | Freq: Every day | ORAL | Status: DC
  Administered 2018-11-23 – 2018-11-27 (×5): 5 mg via ORAL
  Filled 2018-11-23 (×5): qty 1

## 2018-11-23 MED ORDER — BRIMONIDINE-DORZOLAMIDE 0.15-2 % OP SOLN: 1.0000 [drp] | Freq: Two times a day (BID) | OPHTHALMIC | Status: DC

## 2018-11-23 MED ORDER — IPRATROPIUM-ALBUTEROL 0.5-2.5 (3) MG/3ML IN SOLN
3.0000 mL | Freq: Once | RESPIRATORY_TRACT | Status: AC
  Administered 2018-11-23: 3 mL via RESPIRATORY_TRACT

## 2018-11-23 MED ORDER — POLYETHYLENE GLYCOL 3350 17 G PO PACK: 17.0000 g | PACK | Freq: Every day | ORAL | Status: DC | PRN

## 2018-11-23 MED ORDER — DILTIAZEM HCL ER 180 MG PO CP24
180.0000 mg | ORAL_CAPSULE | Freq: Every day | ORAL | Status: DC
  Filled 2018-11-23: qty 1

## 2018-11-23 MED ORDER — LATANOPROST 0.005 % OP SOLN
1.0000 [drp] | Freq: Every day | OPHTHALMIC | Status: DC
  Administered 2018-11-23 – 2018-11-26 (×4): 1 [drp] via OPHTHALMIC
  Filled 2018-11-23: qty 2.5

## 2018-11-23 MED ORDER — DILTIAZEM HCL ER COATED BEADS 180 MG PO CP24
180.0000 mg | ORAL_CAPSULE | Freq: Every day | ORAL | Status: DC
  Administered 2018-11-24 – 2018-11-27 (×4): 180 mg via ORAL
  Filled 2018-11-23 (×4): qty 1

## 2018-11-23 NOTE — Progress Notes (Signed)
Received report from Mechele Claude, ED nurse at [phone]
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
## 2018-11-23 NOTE — ED Provider Notes (Signed)
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 Dr. Dyann Kief (triad) who will admit.  Final Clinical Impressions(s) / ED Diagnoses   Final diagnoses:  Community acquired pneumonia of right upper lobe of lung ([place])  Fever in adult  Metabolic encephalopathy  Coronary artery calcification seen on CT scan  Hematuria, unspecified type  Abdominal aortic aneurysm (AAA) 30 to 34 mm in diameter [facility])    ED Discharge Orders    None       Isla Pence, MD 11/23/18 1443

## 2018-11-23 NOTE — ED Triage Notes (Signed)
Pt's wife states the pt appeared altered when he called her this morning at ~830AM. Pt wife states the pt appeared normal when she left him this morning. Wife states the pt has also had blood in his urine since last week and has hx of UTI's.

## 2018-11-23 NOTE — ED Notes (Signed)
Gave report to Valli Glance, RN for room [phone].

## 2018-11-23 NOTE — ED Notes (Signed)
ED TO INPATIENT HANDOFF REPORT  Name/Age/Gender Wesley [name] 62 y.o. male  Code Status Code Status History    Date Active Date Inactive Code Status Order ID Comments User Context   06/12/2018 0433 06/13/2018 1904 Full Code [id number]  [name], MD ED   04/24/2018 2327 04/29/2018 1739 Full Code [id number]  [name], MD ED   02/20/2018 0405 02/23/2018 1525 Full Code [id number]  [name], MD ED   05/20/2017 0136 05/23/2017 1636 Full Code [id number]  [name], MD ED   12/19/2016 1348 12/22/2016 1702 Full Code [id number]  [name], MD ED   09/16/2016 1910 09/18/2016 1908 Full Code [id number]  [name], MD Inpatient   08/28/2016 0612 08/31/2016 2059 Full Code [id number]  [name], MD Inpatient   04/05/2016 0133 04/06/2016 1643 Full Code [id number]  [name], MD Inpatient      Home/SNF/Other Home  Chief Complaint AMS/ blood in urine / hot   Level of Care/Admitting Diagnosis ED Disposition    ED Disposition Condition [place]: [facility] [[id number]]  Level of Care: Med-Surg [16]  Diagnosis: CAP (community acquired pneumonia) [115726]  Admitting Physician: [name] [[id number]]  Attending Physician: [name] [[id number]]  Estimated length of stay: past midnight tomorrow  Certification:: I certify this patient will need inpatient services for at least 2 midnights  PT Class (Do Not Modify): Inpatient [101]  PT Acc Code (Do Not Modify): Private [1]       Medical History Past Medical History:  Diagnosis Date  . Arthritis    knees  . Blood transfusion without reported diagnosis    during pacemaker surger  . BPH (benign prostatic hyperplasia)   . Cataract    bilateral  . Chronic insomnia 03/31/2016  . COPD (chronic obstructive pulmonary disease) ([place])   . Hyperlipidemia   . Hypertension   . Left-sided weakness 08/28/2016  . Myocardial infarction ([place])   . Pacemaker   . Shortness of breath    . Stroke ([place])   . Substance abuse ([place])   . Tremor, essential 03/31/2016  . Tremors of nervous system   . Tuberculosis    2000    Allergies Allergies  Allergen Reactions  . Penicillins Swelling    "General swelling" Has patient had a PCN reaction causing immediate rash, facial/tongue/throat swelling, SOB or lightheadedness with hypotension: Yes Has patient had a PCN reaction causing severe rash involving mucus membranes or skin necrosis: Unk Has patient had a PCN reaction that required hospitalization: Yes Has patient had a PCN reaction occurring within the last 10 years: No If all of the above answers are "NO", then may proceed with Cephalosporin use.   [name] [Levofloxacin In D5w] Hives    IV Location/Drains/Wounds Patient Lines/Drains/Airways Status   Active Line/Drains/Airways    Name:   Placement date:   Placement time:   Site:   Days:   Peripheral IV 11/23/18 Right Forearm   11/23/18    1024    Forearm   less than 1          Labs/Imaging Results for orders placed or performed during the hospital encounter of 11/23/18 (from the past 48 hour(s))  Comprehensive metabolic panel     Status: Abnormal   Collection Time: 11/23/18  9:52 AM  Result Value Ref Range   Sodium 135 135 - 145 mmol/L   Potassium 4.3 3.5 - 5.1 mmol/L   Chloride 102 98 - 111  mmol/L   CO2 24 22 - 32 mmol/L   Glucose, Bld 212 (H) 70 - 99 mg/dL   BUN 21 8 - 23 mg/dL   Creatinine, Ser 0.98 0.61 - 1.24 mg/dL   Calcium 9.2 8.9 - 10.3 mg/dL   Total Protein 6.8 6.5 - 8.1 g/dL   Albumin 4.1 3.5 - 5.0 g/dL   AST 60 (H) 15 - 41 U/L   ALT 65 (H) 0 - 44 U/L   Alkaline Phosphatase 108 38 - 126 U/L   Total Bilirubin 0.8 0.3 - 1.2 mg/dL   GFR calc non Af Amer >60 >60 mL/min   GFR calc Af Amer >60 >60 mL/min   Anion gap 9 5 - 15    Comment: Performed at [facility], [place] [address] [place], [place] [address]  CBC WITH DIFFERENTIAL     Status: Abnormal   Collection Time: 11/23/18   9:52 AM  Result Value Ref Range   WBC 14.0 (H) 4.0 - 10.5 K/uL   RBC 4.83 4.22 - 5.81 MIL/uL   Hemoglobin 13.2 13.0 - 17.0 g/dL   HCT 42.9 39.0 - 52.0 %   MCV 88.8 80.0 - 100.0 fL   MCH 27.3 26.0 - 34.0 pg   MCHC 30.8 30.0 - 36.0 g/dL   RDW 14.0 11.5 - 15.5 %   Platelets 168 150 - 400 K/uL   nRBC 0.0 0.0 - 0.2 %   Neutrophils Relative % 88 %   Neutro Abs 12.3 (H) 1.7 - 7.7 K/uL   Lymphocytes Relative 7 %   Lymphs Abs 1.0 0.7 - 4.0 K/uL   Monocytes Relative 4 %   Monocytes Absolute 0.6 0.1 - 1.0 K/uL   Eosinophils Relative 0 %   Eosinophils Absolute 0.0 0.0 - 0.5 K/uL   Basophils Relative 0 %   Basophils Absolute 0.0 0.0 - 0.1 K/uL   Immature Granulocytes 1 %   Abs Immature Granulocytes 0.07 0.00 - 0.07 K/uL    Comment: Performed at [facility], [place] [address], [place], [place] [address]  Urinalysis, Routine w reflex microscopic     Status: Abnormal   Collection Time: 11/23/18  9:52 AM  Result Value Ref Range   Color, Urine YELLOW YELLOW   APPearance HAZY (A) CLEAR   Specific Gravity, Urine 1.015 1.005 - 1.030   pH 6.0 5.0 - 8.0   Glucose, UA >=500 (A) NEGATIVE mg/dL   Hgb urine dipstick LARGE (A) NEGATIVE   Bilirubin Urine NEGATIVE NEGATIVE   Ketones, ur NEGATIVE NEGATIVE mg/dL   Protein, ur 30 (A) NEGATIVE mg/dL   Nitrite NEGATIVE NEGATIVE   Leukocytes, UA NEGATIVE NEGATIVE   RBC / HPF >50 (H) 0 - 5 RBC/hpf   WBC, UA 6-10 0 - 5 WBC/hpf   Bacteria, UA RARE (A) NONE SEEN   Squamous Epithelial / LPF 0-5 0 - 5    Comment: Performed at [facility], [place] [address] [place], [place] [address]  I-Stat CG4 Lactic Acid, ED     Status: Abnormal   Collection Time: 11/23/18 10:29 AM  Result Value Ref Range   Lactic Acid, Venous 2.52 (HH) 0.5 - 1.9 mmol/L   Comment NOTIFIED PHYSICIAN   I-Stat CG4 Lactic Acid, ED     Status: None   Collection Time: 11/23/18 12:37 PM  Result Value Ref Range   Lactic Acid, Venous 1.49 0.5 - 1.9 mmol/L   Influenza panel by PCR (type A & B)  Status: None   Collection Time: 11/23/18  1:15 PM  Result Value Ref Range   Influenza A By PCR NEGATIVE NEGATIVE   Influenza B By PCR NEGATIVE NEGATIVE    Comment: (NOTE) The Xpert Xpress Flu assay is intended as an aid in the diagnosis of  influenza and should not be used as a sole basis for treatment.  This  assay is FDA approved for nasopharyngeal swab specimens only. Nasal  washings and aspirates are unacceptable for Xpert Xpress Flu testing. Performed at [facility], [place] [address] [place], [place] [address]    Dg Chest 2 View  Result Date: 11/23/2018 CLINICAL DATA:  Fever. EXAM: CHEST - 2 VIEW COMPARISON:  Chest x-rays dated 04/24/2018 and 02/19/2018 and chest CT dated 02/20/2018 FINDINGS: There is a new faint right perihilar infiltrate. Extensive bullous emphysema in the right upper lobe. Left lung is clear. Heart size and vascularity are normal. Pacemaker in place. No acute bone abnormality. No effusions. IMPRESSION: New faint right perihilar infiltrate. Electronically Signed   By: Lorriane Shire M.D.   On: 11/23/2018 10:50   Ct Head Wo Contrast  Result Date: 11/23/2018 CLINICAL DATA:  Altered level of consciousness. EXAM: CT HEAD WITHOUT CONTRAST TECHNIQUE: Contiguous axial images were obtained from the base of the skull through the vertex without intravenous contrast. COMPARISON:  CT scan of September 28, 2018. FINDINGS: Brain: Mild diffuse cortical atrophy is noted. No mass effect or midline shift is noted. Ventricular size is within normal limits. There is no evidence of mass lesion, hemorrhage or acute infarction. Vascular: No hyperdense vessel or unexpected calcification. Skull: Normal. Negative for fracture or focal lesion. Sinuses/Orbits: No acute finding. Other: None. IMPRESSION: Mild diffuse cortical atrophy. No acute intracranial abnormality seen. Electronically Signed   By: Marijo Conception, M.D.   On: 11/23/2018  14:05   Ct Chest W Contrast  Result Date: 11/23/2018 CLINICAL DATA:  62 year old male with history of altered mental status. Hematuria for the past week. History of urinary tract infections. EXAM: CT CHEST, ABDOMEN, AND PELVIS WITH CONTRAST TECHNIQUE: Multidetector CT imaging of the chest, abdomen and pelvis was performed following the standard protocol during bolus administration of intravenous contrast. CONTRAST:  127mL ISOVUE-300 IOPAMIDOL (ISOVUE-300) INJECTION 61% COMPARISON:  CT the chest, abdomen and pelvis 02/20/2018. FINDINGS: CT CHEST FINDINGS Cardiovascular: Heart size is normal. There is no significant pericardial fluid, thickening or pericardial calcification. There is aortic atherosclerosis, as well as atherosclerosis of the great vessels of the mediastinum and the coronary arteries, including calcified atherosclerotic plaque in the left main, left anterior descending, left circumflex and right coronary arteries. Left-sided pacemaker with lead tips terminating in the right atrium and right ventricular apex. Mediastinum/Nodes: No pathologically enlarged mediastinal or hilar lymph nodes. Esophagus is unremarkable in appearance. No axillary lymphadenopathy. Lungs/Pleura: Compared to the prior examination there is new peribronchovascular airspace consolidation throughout the right upper lobe, most compatible with severe bronchopneumonia. A few scattered 2-3 mm pulmonary nodules are noted in the lungs bilaterally, stable compared to prior examinations, considered benign. No larger more suspicious appearing pulmonary nodules or masses are noted. Diffuse bronchial wall thickening with mild centrilobular and moderate to severe paraseptal emphysema, including extensive bullous changes in the apex of the right lung. Musculoskeletal: There are no aggressive appearing lytic or blastic lesions noted in the visualized portions of the skeleton. CT ABDOMEN PELVIS FINDINGS Hepatobiliary: No suspicious cystic or  solid hepatic lesions. No intra or extrahepatic biliary ductal dilatation. Gallbladder is normal in  appearance. Pancreas: No pancreatic mass. No pancreatic ductal dilatation. No pancreatic or peripancreatic fluid or inflammatory changes. Spleen: Calcified granuloma in the spleen.  Otherwise, unremarkable. Adrenals/Urinary Tract: Bilateral kidneys and bilateral adrenal glands are normal in appearance. No hydroureteronephrosis. Urinary bladder is normal in appearance. Stomach/Bowel: Normal appearance of the stomach. No pathologic dilatation of small bowel or colon. A few scattered colonic diverticulae are noted, without surrounding inflammatory changes to suggest an acute diverticulitis at this time. Normal appendix. Vascular/Lymphatic: Aortic atherosclerosis with mild fusiform aneurysmal dilatation of the infrarenal abdominal aorta which measures up to 3.2 x 2.9 cm. No lymphadenopathy noted in the abdomen or pelvis. Reproductive: Prostate gland and seminal vesicles are unremarkable in appearance. Other: No significant volume of ascites.  No pneumoperitoneum. Musculoskeletal: 1 cm sclerotic lesion with narrow zone of transition in the anterior aspect of the left ilium, stable compared to prior examinations, most compatible with a small bone island. There are no other aggressive appearing lytic or blastic lesions noted in the visualized portions of the skeleton. IMPRESSION: 1. Severe right upper lobe bronchopneumonia. 2. No acute findings noted in the abdomen or pelvis. 3. Colonic diverticulosis without evidence of acute diverticulitis at this time. 4. Aortic atherosclerosis, in addition to left main and 3 vessel coronary artery disease. Please note that although the presence of coronary artery calcium documents the presence of coronary artery disease, the severity of this disease and any potential stenosis cannot be assessed on this non-gated CT examination. Assessment for potential risk factor modification, dietary  therapy or pharmacologic therapy may be warranted, if clinically indicated. 5. In addition, there is mild fusiform aneurysmal dilatation of the infrarenal abdominal aorta which measures up to 3.2 x 2.9 cm. Recommend followup by ultrasound in 3 years. This recommendation follows ACR consensus guidelines: White Paper of the ACR Incidental Findings Committee II on Vascular Findings. J Am Coll Radiol 2013; 10:789-794. 6. Emphysema with bullous changes in the apex of the right lung, similar to prior examinations. 7. Additional incidental findings, as above. Electronically Signed   By: Vinnie Langton M.D.   On: 11/23/2018 14:14   Ct Abdomen Pelvis W Contrast  Result Date: 11/23/2018 CLINICAL DATA:  62 year old male with history of altered mental status. Hematuria for the past week. History of urinary tract infections. EXAM: CT CHEST, ABDOMEN, AND PELVIS WITH CONTRAST TECHNIQUE: Multidetector CT imaging of the chest, abdomen and pelvis was performed following the standard protocol during bolus administration of intravenous contrast. CONTRAST:  134mL ISOVUE-300 IOPAMIDOL (ISOVUE-300) INJECTION 61% COMPARISON:  CT the chest, abdomen and pelvis 02/20/2018. FINDINGS: CT CHEST FINDINGS Cardiovascular: Heart size is normal. There is no significant pericardial fluid, thickening or pericardial calcification. There is aortic atherosclerosis, as well as atherosclerosis of the great vessels of the mediastinum and the coronary arteries, including calcified atherosclerotic plaque in the left main, left anterior descending, left circumflex and right coronary arteries. Left-sided pacemaker with lead tips terminating in the right atrium and right ventricular apex. Mediastinum/Nodes: No pathologically enlarged mediastinal or hilar lymph nodes. Esophagus is unremarkable in appearance. No axillary lymphadenopathy. Lungs/Pleura: Compared to the prior examination there is new peribronchovascular airspace consolidation throughout the  right upper lobe, most compatible with severe bronchopneumonia. A few scattered 2-3 mm pulmonary nodules are noted in the lungs bilaterally, stable compared to prior examinations, considered benign. No larger more suspicious appearing pulmonary nodules or masses are noted. Diffuse bronchial wall thickening with mild centrilobular and moderate to severe paraseptal emphysema, including extensive bullous changes in the apex of  the right lung. Musculoskeletal: There are no aggressive appearing lytic or blastic lesions noted in the visualized portions of the skeleton. CT ABDOMEN PELVIS FINDINGS Hepatobiliary: No suspicious cystic or solid hepatic lesions. No intra or extrahepatic biliary ductal dilatation. Gallbladder is normal in appearance. Pancreas: No pancreatic mass. No pancreatic ductal dilatation. No pancreatic or peripancreatic fluid or inflammatory changes. Spleen: Calcified granuloma in the spleen.  Otherwise, unremarkable. Adrenals/Urinary Tract: Bilateral kidneys and bilateral adrenal glands are normal in appearance. No hydroureteronephrosis. Urinary bladder is normal in appearance. Stomach/Bowel: Normal appearance of the stomach. No pathologic dilatation of small bowel or colon. A few scattered colonic diverticulae are noted, without surrounding inflammatory changes to suggest an acute diverticulitis at this time. Normal appendix. Vascular/Lymphatic: Aortic atherosclerosis with mild fusiform aneurysmal dilatation of the infrarenal abdominal aorta which measures up to 3.2 x 2.9 cm. No lymphadenopathy noted in the abdomen or pelvis. Reproductive: Prostate gland and seminal vesicles are unremarkable in appearance. Other: No significant volume of ascites.  No pneumoperitoneum. Musculoskeletal: 1 cm sclerotic lesion with narrow zone of transition in the anterior aspect of the left ilium, stable compared to prior examinations, most compatible with a small bone island. There are no other aggressive appearing lytic  or blastic lesions noted in the visualized portions of the skeleton. IMPRESSION: 1. Severe right upper lobe bronchopneumonia. 2. No acute findings noted in the abdomen or pelvis. 3. Colonic diverticulosis without evidence of acute diverticulitis at this time. 4. Aortic atherosclerosis, in addition to left main and 3 vessel coronary artery disease. Please note that although the presence of coronary artery calcium documents the presence of coronary artery disease, the severity of this disease and any potential stenosis cannot be assessed on this non-gated CT examination. Assessment for potential risk factor modification, dietary therapy or pharmacologic therapy may be warranted, if clinically indicated. 5. In addition, there is mild fusiform aneurysmal dilatation of the infrarenal abdominal aorta which measures up to 3.2 x 2.9 cm. Recommend followup by ultrasound in 3 years. This recommendation follows ACR consensus guidelines: White Paper of the ACR Incidental Findings Committee II on Vascular Findings. J Am Coll Radiol 2013; 10:789-794. 6. Emphysema with bullous changes in the apex of the right lung, similar to prior examinations. 7. Additional incidental findings, as above. Electronically Signed   By: Vinnie Langton M.D.   On: 11/23/2018 14:14   EKG Interpretation  Date/Time:  Tuesday November 23 2018 10:08:13 EST Ventricular Rate:  76 PR Interval:    QRS Duration: 75 QT Interval:  353 QTC Calculation: 397 R Axis:   74 Text Interpretation:  Sinus rhythm Anteroseptal infarct, old No significant change since last tracing Confirmed by Isla Pence [phone]) on 11/23/2018 2:34:11 PM   Pending Labs Unresulted Labs (From admission, onward)    Start     Ordered   11/23/18 1605  Hemoglobin A1c  Once,   R     11/23/18 1658   11/23/18 0953  Urine culture  ONCE - STAT,   STAT     11/23/18 0953   11/23/18 0952  Blood Culture (routine x 2)  BLOOD CULTURE X 2,   STAT     11/23/18 0953   Signed and Held   Culture, blood (routine x 2) Call MD if unable to obtain prior to antibiotics being given  BLOOD CULTURE X 2,   R    Comments:  If blood cultures drawn in Emergency Department - Do not draw and cancel order    Signed and Held  Signed and Held  Culture, sputum-assessment  Once,   R     Signed and Held   Signed and Held  Gram stain  Once,   R     Signed and Held   Signed and Held  HIV antibody (Routine Screening)  Once,   R     Signed and Held   Signed and Held  Strep pneumoniae urinary antigen  Once,   R     Signed and Held          Vitals/Pain Today's Vitals   11/23/18 1318 11/23/18 1409 11/23/18 1430 11/23/18 1633  BP:   103/63 123/81  Pulse:   71 60  Resp:   18 18  Temp: 98.9 F (37.2 C)     TempSrc: Rectal     SpO2:  96% 92% 96%  PainSc:        Isolation Precautions No active isolations  Medications Medications  cefTRIAXone (ROCEPHIN) 1 g in sodium chloride 0.9 % 100 mL IVPB (0 g Intravenous Stopped 11/23/18 1140)  iopamidol (ISOVUE-300) 61 % injection (has no administration in time range)  sodium chloride (PF) 0.9 % injection (has no administration in time range)  sodium chloride 0.9 % bolus 1,000 mL (0 mLs Intravenous Stopped 11/23/18 1314)    And  sodium chloride 0.9 % bolus 1,000 mL (0 mLs Intravenous Stopped 11/23/18 1314)    And  sodium chloride 0.9 % bolus 1,000 mL (0 mLs Intravenous Stopped 11/23/18 1139)  acetaminophen (TYLENOL) tablet 1,000 mg (1,000 mg Oral Given 11/23/18 1019)  ipratropium-albuterol (DUONEB) 0.5-2.5 (3) MG/3ML nebulizer solution 3 mL (3 mLs Nebulization Given 11/23/18 1405)  azithromycin (ZITHROMAX) 500 mg in sodium chloride 0.9 % 250 mL IVPB (0 mg Intravenous Stopped 11/23/18 1732)  iopamidol (ISOVUE-300) 61 % injection 100 mL (100 mLs Intravenous Contrast Given 11/23/18 1332)    Mobility walks

## 2018-11-23 NOTE — ED Notes (Signed)
PT and family have been made aware of urine sample. Urinal in hand

## 2018-11-23 NOTE — ED Notes (Signed)
Bed: LI22 Expected date:  Expected time:  Means of arrival:  Comments: Hold for 17

## 2018-11-24 DIAGNOSIS — I10 Essential (primary) hypertension: Secondary | ICD-10-CM

## 2018-11-24 DIAGNOSIS — I2510 Atherosclerotic heart disease of native coronary artery without angina pectoris: Secondary | ICD-10-CM

## 2018-11-24 DIAGNOSIS — J181 Lobar pneumonia, unspecified organism: Secondary | ICD-10-CM

## 2018-11-24 DIAGNOSIS — I495 Sick sinus syndrome: Secondary | ICD-10-CM

## 2018-11-24 DIAGNOSIS — F329 Major depressive disorder, single episode, unspecified: Secondary | ICD-10-CM

## 2018-11-24 DIAGNOSIS — N400 Enlarged prostate without lower urinary tract symptoms: Secondary | ICD-10-CM

## 2018-11-24 DIAGNOSIS — G9341 Metabolic encephalopathy: Secondary | ICD-10-CM

## 2018-11-24 DIAGNOSIS — F419 Anxiety disorder, unspecified: Secondary | ICD-10-CM

## 2018-11-24 DIAGNOSIS — E7800 Pure hypercholesterolemia, unspecified: Secondary | ICD-10-CM

## 2018-11-24 DIAGNOSIS — R569 Unspecified convulsions: Secondary | ICD-10-CM

## 2018-11-24 DIAGNOSIS — R319 Hematuria, unspecified: Secondary | ICD-10-CM

## 2018-11-24 DIAGNOSIS — F17200 Nicotine dependence, unspecified, uncomplicated: Secondary | ICD-10-CM

## 2018-11-24 LAB — HIV ANTIBODY (ROUTINE TESTING W REFLEX): HIV Screen 4th Generation wRfx: NONREACTIVE

## 2018-11-24 LAB — CBC WITH DIFFERENTIAL/PLATELET
Abs Immature Granulocytes: 0.04 10*3/uL (ref 0.00–0.07)
Basophils Absolute: 0 10*3/uL (ref 0.0–0.1)
Basophils Relative: 0 %
Eosinophils Absolute: 0.1 10*3/uL (ref 0.0–0.5)
Eosinophils Relative: 1 %
HEMATOCRIT: 39.3 % (ref 39.0–52.0)
Hemoglobin: 11.8 g/dL — ABNORMAL LOW (ref 13.0–17.0)
Immature Granulocytes: 0 %
LYMPHS PCT: 16 %
Lymphs Abs: 1.6 10*3/uL (ref 0.7–4.0)
MCH: 26.5 pg (ref 26.0–34.0)
MCHC: 30 g/dL (ref 30.0–36.0)
MCV: 88.1 fL (ref 80.0–100.0)
Monocytes Absolute: 0.4 10*3/uL (ref 0.1–1.0)
Monocytes Relative: 4 %
NEUTROS ABS: 8.2 10*3/uL — AB (ref 1.7–7.7)
Neutrophils Relative %: 79 %
Platelets: 147 10*3/uL — ABNORMAL LOW (ref 150–400)
RBC: 4.46 MIL/uL (ref 4.22–5.81)
RDW: 14.2 % (ref 11.5–15.5)
WBC: 10.4 10*3/uL (ref 4.0–10.5)
nRBC: 0 % (ref 0.0–0.2)

## 2018-11-24 LAB — URINE CULTURE: Culture: NO GROWTH

## 2018-11-24 LAB — BASIC METABOLIC PANEL
Anion gap: 8 (ref 5–15)
BUN: 13 mg/dL (ref 8–23)
CALCIUM: 8.6 mg/dL — AB (ref 8.9–10.3)
CO2: 24 mmol/L (ref 22–32)
CREATININE: 0.74 mg/dL (ref 0.61–1.24)
Chloride: 107 mmol/L (ref 98–111)
GFR calc Af Amer: >60 mL/min (ref >60)
GFR calc non Af Amer: >60 mL/min (ref >60)
Glucose, Bld: 188 mg/dL — ABNORMAL HIGH (ref 70–99)
Potassium: 3.7 mmol/L (ref 3.5–5.1)
Sodium: 139 mmol/L (ref 135–145)

## 2018-11-24 LAB — STREP PNEUMONIAE URINARY ANTIGEN: Strep Pneumo Urinary Antigen: NEGATIVE

## 2018-11-24 LAB — GLUCOSE, CAPILLARY
Glucose-Capillary: 117 mg/dL — ABNORMAL HIGH (ref 70–99)
Glucose-Capillary: 185 mg/dL — ABNORMAL HIGH (ref 70–99)
Glucose-Capillary: 164 mg/dL — ABNORMAL HIGH (ref 70–99)
Glucose-Capillary: 176 mg/dL — ABNORMAL HIGH (ref 70–99)

## 2018-11-24 MED ORDER — BUDESONIDE 0.5 MG/2ML IN SUSP
0.5000 mg | Freq: Two times a day (BID) | RESPIRATORY_TRACT | Status: DC
  Administered 2018-11-24 – 2018-11-27 (×6): 0.5 mg via RESPIRATORY_TRACT
  Filled 2018-11-24 (×6): qty 2

## 2018-11-24 MED ORDER — IPRATROPIUM-ALBUTEROL 0.5-2.5 (3) MG/3ML IN SOLN
3.0000 mL | Freq: Four times a day (QID) | RESPIRATORY_TRACT | Status: DC
  Administered 2018-11-25 (×3): 3 mL via RESPIRATORY_TRACT
  Filled 2018-11-24 (×3): qty 3

## 2018-11-24 MED ORDER — IPRATROPIUM-ALBUTEROL 0.5-2.5 (3) MG/3ML IN SOLN: 3.0000 mL | RESPIRATORY_TRACT | Status: DC | PRN

## 2018-11-24 MED ORDER — POLYETHYLENE GLYCOL 3350 17 G PO PACK
17.0000 g | PACK | Freq: Every day | ORAL | Status: DC | PRN
  Administered 2018-11-25 – 2018-11-27 (×3): 17 g via ORAL
  Filled 2018-11-24 (×2): qty 1

## 2018-11-24 MED ORDER — SENNA 8.6 MG PO TABS
1.0000 | ORAL_TABLET | Freq: Every day | ORAL | Status: DC | PRN
  Administered 2018-11-25 – 2018-11-26 (×2): 8.6 mg via ORAL
  Filled 2018-11-24 (×2): qty 1

## 2018-11-24 MED ORDER — NICOTINE 7 MG/24HR TD PT24
7.0000 mg | MEDICATED_PATCH | Freq: Every day | TRANSDERMAL | Status: DC
  Administered 2018-11-24 – 2018-11-27 (×4): 7 mg via TRANSDERMAL
  Filled 2018-11-24 (×4): qty 1

## 2018-11-24 MED ORDER — IPRATROPIUM-ALBUTEROL 0.5-2.5 (3) MG/3ML IN SOLN
3.0000 mL | Freq: Four times a day (QID) | RESPIRATORY_TRACT | Status: DC
  Administered 2018-11-24 (×2): 3 mL via RESPIRATORY_TRACT
  Filled 2018-11-24 (×2): qty 3

## 2018-11-24 NOTE — Progress Notes (Signed)
RT gave pt flutter valve. Pt knows and understands how to use.  

## 2018-11-24 NOTE — Progress Notes (Signed)
Pt wife would like to know if it is possible for the pt to have a CT urogram performed here instead of at the [place]. The wife name is Rubie Maid and her number is [phone]  [facility] RN

## 2018-11-24 NOTE — Progress Notes (Signed)
PROGRESS NOTE    Wesley [name]  DXI:[id number] DOB: 1956-10-31 DOA: 11/23/2018 PCP: [facility]   Brief Narrative:  Patient is a 62 year old gentleman history of hypertension, hyperlipidemia, prior CVA with left-sided residual weakness, COPD, ongoing tobacco abuse, essential tremors, GERD, seizure and increased intraocular pressure, BPH presented to the ED with fever altered mental status, intermittent productive spells lasting 3 to 4 days.  Patient with ongoing hematuria being followed by urology service at [facility].  Chest x-ray done concerning for acute infiltrate.  Patient admitted placed empirically on IV antibiotics.   Assessment & Plan:   Principal Problem:   CAP (community acquired pneumonia) Active Problems:   Essential hypertension   Tobacco use disorder   Anxiety and depression   BPH (benign prostatic hyperplasia)   HLD (hyperlipidemia)   SSS (sick sinus syndrome) (HCC)   Pneumonia of right middle lobe due to infectious organism ([place])   Seizures ([place])   Hematuria  1 community-acquired pneumonia Patient presented with fevers, altered mental status, chest x-ray consistent with a pneumonia. CURB 65 > 2.  Patient pancultured results pending.  Check a urine Legionella antigen.  Check a urine pneumococcus antigen.  Increase Pulmicort to 0.5 mg twice daily.  Continue Flonase, Claritin.  Place on duo nebs every 6 hours.  Follow.  2.  Hypertension Stable.  Continue Cardizem, Proscar, lisinopril, Flomax.  Follow.  3.  History of seizures Stable.  No seizures noted.  Continue.  4.  BPH Stable.  Continue Proscar, Flomax.  5.  Hematuria CT abdomen and pelvis which was done on admission was unremarkable for any acute abnormalities.  Bladder was unremarkable.  Hemoglobin currently at 11.8.  Outpatient follow-up with urology at the [facility].  6.  Depression/anxiety Stable.  Continue Effexor.  7.  Tobacco abuse Tobacco cessation.  Placed on a nicotine  patch.  8.  Hyperlipidemia Continue statin.  9.  Sick sinus syndrome status post PPM Stable.  Outpatient follow-up with cardiology.  10.  Diabetes mellitus type 2 Hemoglobin A1c 6.7.  CBG was 117 this morning.  Continue Lantus 16 units daily.  Continue sliding scale insulin.  Outpatient follow-up with PCP.  11.  Increased intraocular pressure Stable.  Continue xalatan, Alphagan, Trusopt.  12.  Metabolic encephalopathy Likely secondary to pneumonia.  Improving.  Continue empiric IV antibiotics.   DVT prophylaxis: SCDs Code Status: Full Family Communication: Updated patient and wife at bedtime. Disposition Plan: Home when clinically improved and no oral antibiotics.   Consultants:   None  Procedures:   CT abdomen and pelvis 11/23/2018  CT head 11/23/2018  CT chest 11/23/2018  CXR 11/23/2018  Antimicrobials:   IV azithromycin 11/23/2018  IV Rocephin 11/23/2018   Subjective: Patient laying in bed.  Complaining of some abdominal discomfort.  Denies any chest pain.  States shortness of breath improving.  States cough improving.  Improving clinically than on admission however not at baseline.  Patient alert to self place and time.  Objective: Vitals:   11/23/18 2222 11/23/18 2242 11/24/18 0548 11/24/18 0751  BP:  134/78 125/75   Pulse:  71 60   Resp:  16 17   Temp:  98.6 F (37 C) 97.8 F (36.6 C)   TempSrc:  Oral Oral   SpO2: 93% 95% 95% 91%  Weight:      Height:        Intake/Output Summary (Last 24 hours) at 11/24/2018 1309 Last data filed at 11/24/2018 1015 Gross per 24 hour  Intake 3145.38 ml  Output 1800 ml  Net 1345.38 ml   Filed Weights   11/23/18 1839  Weight: 96.8 kg    Examination:  General exam: Appears calm and comfortable  Respiratory system: Some coarse breath sounds.  No crackles.  Speaking in full sentences.  Respiratory effort normal. Cardiovascular system: S1 & S2 heard, RRR. No JVD, murmurs, rubs, gallops or clicks. No pedal  edema. Gastrointestinal system: Abdomen is mildly distended, soft, nontender to palpation, positive bowel sounds.  Central nervous system: Alert and oriented. No focal neurological deficits. Extremities: Symmetric 5 x 5 power. Skin: No rashes, lesions or ulcers Psychiatry: Judgement and insight appear normal. Mood & affect appropriate.     Data Reviewed: I have personally reviewed following labs and imaging studies  CBC: Recent Labs  Lab 11/23/18 0952 11/24/18 0921  WBC 14.0* 10.4  NEUTROABS 12.3* 8.2*  HGB 13.2 11.8*  HCT 42.9 39.3  MCV 88.8 88.1  PLT 168 102*   Basic Metabolic Panel: Recent Labs  Lab 11/23/18 0952 11/24/18 0921  NA 135 139  K 4.3 3.7  CL 102 107  CO2 24 24  GLUCOSE 212* 188*  BUN 21 13  CREATININE 0.98 0.74  CALCIUM 9.2 8.6*   GFR: Estimated Creatinine Clearance: 111.3 mL/min (by C-G formula based on SCr of 0.74 mg/dL). Liver Function Tests: Recent Labs  Lab 11/23/18 0952  AST 60*  ALT 65*  ALKPHOS 108  BILITOT 0.8  PROT 6.8  ALBUMIN 4.1   No results for input(s): LIPASE, AMYLASE in the last 168 hours. No results for input(s): AMMONIA in the last 168 hours. Coagulation Profile: No results for input(s): INR, PROTIME in the last 168 hours. Cardiac Enzymes: No results for input(s): CKTOTAL, CKMB, CKMBINDEX, TROPONINI in the last 168 hours. BNP (last 3 results) No results for input(s): PROBNP in the last 8760 hours. HbA1C: Recent Labs    11/23/18 1605  HGBA1C 6.7*   CBG: Recent Labs  Lab 11/23/18 2244 11/24/18 0745 11/24/18 1159  GLUCAP 152* 117* 185*   Lipid Profile: No results for input(s): CHOL, HDL, LDLCALC, TRIG, CHOLHDL, LDLDIRECT in the last 72 hours. Thyroid Function Tests: No results for input(s): TSH, T4TOTAL, FREET4, T3FREE, THYROIDAB in the last 72 hours. Anemia Panel: No results for input(s): VITAMINB12, FOLATE, FERRITIN, TIBC, IRON, RETICCTPCT in the last 72 hours. Sepsis Labs: Recent Labs  Lab 11/23/18 1029  11/23/18 1237  LATICACIDVEN 2.52* 1.49    Recent Results (from the past 240 hour(s))  Urine culture     Status: None   Collection Time: 11/23/18  9:53 AM  Result Value Ref Range Status   Specimen Description   Final    URINE, RANDOM Performed at [facility] [address], [place], [place] [address]    Special Requests   Final    NONE Performed at [facility], [place] [address] [place], [place] [address]    Culture   Final    NO GROWTH Performed at [facility], [place] [address] [place], [place] [address]    Report Status 11/24/2018 FINAL  Final         Radiology Studies: Dg Chest 2 View  Result Date: 11/23/2018 CLINICAL DATA:  Fever. EXAM: CHEST - 2 VIEW COMPARISON:  Chest x-rays dated 04/24/2018 and 02/19/2018 and chest CT dated 02/20/2018 FINDINGS: There is a new faint right perihilar infiltrate. Extensive bullous emphysema in the right upper lobe. Left lung is clear. Heart size and vascularity are normal. Pacemaker in place.  No acute bone abnormality. No effusions. IMPRESSION: New faint right perihilar infiltrate. Electronically Signed   By: Lorriane Shire M.D.   On: 11/23/2018 10:50   Ct Head Wo Contrast  Result Date: 11/23/2018 CLINICAL DATA:  Altered level of consciousness. EXAM: CT HEAD WITHOUT CONTRAST TECHNIQUE: Contiguous axial images were obtained from the base of the skull through the vertex without intravenous contrast. COMPARISON:  CT scan of September 28, 2018. FINDINGS: Brain: Mild diffuse cortical atrophy is noted. No mass effect or midline shift is noted. Ventricular size is within normal limits. There is no evidence of mass lesion, hemorrhage or acute infarction. Vascular: No hyperdense vessel or unexpected calcification. Skull: Normal. Negative for fracture or focal lesion. Sinuses/Orbits: No acute finding. Other: None. IMPRESSION: Mild diffuse cortical atrophy. No acute intracranial abnormality seen. Electronically  Signed   By: Marijo Conception, M.D.   On: 11/23/2018 14:05   Ct Chest W Contrast  Result Date: 11/23/2018 CLINICAL DATA:  62 year old male with history of altered mental status. Hematuria for the past week. History of urinary tract infections. EXAM: CT CHEST, ABDOMEN, AND PELVIS WITH CONTRAST TECHNIQUE: Multidetector CT imaging of the chest, abdomen and pelvis was performed following the standard protocol during bolus administration of intravenous contrast. CONTRAST:  16mL ISOVUE-300 IOPAMIDOL (ISOVUE-300) INJECTION 61% COMPARISON:  CT the chest, abdomen and pelvis 02/20/2018. FINDINGS: CT CHEST FINDINGS Cardiovascular: Heart size is normal. There is no significant pericardial fluid, thickening or pericardial calcification. There is aortic atherosclerosis, as well as atherosclerosis of the great vessels of the mediastinum and the coronary arteries, including calcified atherosclerotic plaque in the left main, left anterior descending, left circumflex and right coronary arteries. Left-sided pacemaker with lead tips terminating in the right atrium and right ventricular apex. Mediastinum/Nodes: No pathologically enlarged mediastinal or hilar lymph nodes. Esophagus is unremarkable in appearance. No axillary lymphadenopathy. Lungs/Pleura: Compared to the prior examination there is new peribronchovascular airspace consolidation throughout the right upper lobe, most compatible with severe bronchopneumonia. A few scattered 2-3 mm pulmonary nodules are noted in the lungs bilaterally, stable compared to prior examinations, considered benign. No larger more suspicious appearing pulmonary nodules or masses are noted. Diffuse bronchial wall thickening with mild centrilobular and moderate to severe paraseptal emphysema, including extensive bullous changes in the apex of the right lung. Musculoskeletal: There are no aggressive appearing lytic or blastic lesions noted in the visualized portions of the skeleton. CT ABDOMEN  PELVIS FINDINGS Hepatobiliary: No suspicious cystic or solid hepatic lesions. No intra or extrahepatic biliary ductal dilatation. Gallbladder is normal in appearance. Pancreas: No pancreatic mass. No pancreatic ductal dilatation. No pancreatic or peripancreatic fluid or inflammatory changes. Spleen: Calcified granuloma in the spleen.  Otherwise, unremarkable. Adrenals/Urinary Tract: Bilateral kidneys and bilateral adrenal glands are normal in appearance. No hydroureteronephrosis. Urinary bladder is normal in appearance. Stomach/Bowel: Normal appearance of the stomach. No pathologic dilatation of small bowel or colon. A few scattered colonic diverticulae are noted, without surrounding inflammatory changes to suggest an acute diverticulitis at this time. Normal appendix. Vascular/Lymphatic: Aortic atherosclerosis with mild fusiform aneurysmal dilatation of the infrarenal abdominal aorta which measures up to 3.2 x 2.9 cm. No lymphadenopathy noted in the abdomen or pelvis. Reproductive: Prostate gland and seminal vesicles are unremarkable in appearance. Other: No significant volume of ascites.  No pneumoperitoneum. Musculoskeletal: 1 cm sclerotic lesion with narrow zone of transition in the anterior aspect of the left ilium, stable compared to prior examinations, most compatible with a small bone island. There are no other  aggressive appearing lytic or blastic lesions noted in the visualized portions of the skeleton. IMPRESSION: 1. Severe right upper lobe bronchopneumonia. 2. No acute findings noted in the abdomen or pelvis. 3. Colonic diverticulosis without evidence of acute diverticulitis at this time. 4. Aortic atherosclerosis, in addition to left main and 3 vessel coronary artery disease. Please note that although the presence of coronary artery calcium documents the presence of coronary artery disease, the severity of this disease and any potential stenosis cannot be assessed on this non-gated CT examination.  Assessment for potential risk factor modification, dietary therapy or pharmacologic therapy may be warranted, if clinically indicated. 5. In addition, there is mild fusiform aneurysmal dilatation of the infrarenal abdominal aorta which measures up to 3.2 x 2.9 cm. Recommend followup by ultrasound in 3 years. This recommendation follows ACR consensus guidelines: White Paper of the ACR Incidental Findings Committee II on Vascular Findings. J Am Coll Radiol 2013; 10:789-794. 6. Emphysema with bullous changes in the apex of the right lung, similar to prior examinations. 7. Additional incidental findings, as above. Electronically Signed   By: Vinnie Langton M.D.   On: 11/23/2018 14:14   Ct Abdomen Pelvis W Contrast  Result Date: 11/23/2018 CLINICAL DATA:  62 year old male with history of altered mental status. Hematuria for the past week. History of urinary tract infections. EXAM: CT CHEST, ABDOMEN, AND PELVIS WITH CONTRAST TECHNIQUE: Multidetector CT imaging of the chest, abdomen and pelvis was performed following the standard protocol during bolus administration of intravenous contrast. CONTRAST:  128mL ISOVUE-300 IOPAMIDOL (ISOVUE-300) INJECTION 61% COMPARISON:  CT the chest, abdomen and pelvis 02/20/2018. FINDINGS: CT CHEST FINDINGS Cardiovascular: Heart size is normal. There is no significant pericardial fluid, thickening or pericardial calcification. There is aortic atherosclerosis, as well as atherosclerosis of the great vessels of the mediastinum and the coronary arteries, including calcified atherosclerotic plaque in the left main, left anterior descending, left circumflex and right coronary arteries. Left-sided pacemaker with lead tips terminating in the right atrium and right ventricular apex. Mediastinum/Nodes: No pathologically enlarged mediastinal or hilar lymph nodes. Esophagus is unremarkable in appearance. No axillary lymphadenopathy. Lungs/Pleura: Compared to the prior examination there is new  peribronchovascular airspace consolidation throughout the right upper lobe, most compatible with severe bronchopneumonia. A few scattered 2-3 mm pulmonary nodules are noted in the lungs bilaterally, stable compared to prior examinations, considered benign. No larger more suspicious appearing pulmonary nodules or masses are noted. Diffuse bronchial wall thickening with mild centrilobular and moderate to severe paraseptal emphysema, including extensive bullous changes in the apex of the right lung. Musculoskeletal: There are no aggressive appearing lytic or blastic lesions noted in the visualized portions of the skeleton. CT ABDOMEN PELVIS FINDINGS Hepatobiliary: No suspicious cystic or solid hepatic lesions. No intra or extrahepatic biliary ductal dilatation. Gallbladder is normal in appearance. Pancreas: No pancreatic mass. No pancreatic ductal dilatation. No pancreatic or peripancreatic fluid or inflammatory changes. Spleen: Calcified granuloma in the spleen.  Otherwise, unremarkable. Adrenals/Urinary Tract: Bilateral kidneys and bilateral adrenal glands are normal in appearance. No hydroureteronephrosis. Urinary bladder is normal in appearance. Stomach/Bowel: Normal appearance of the stomach. No pathologic dilatation of small bowel or colon. A few scattered colonic diverticulae are noted, without surrounding inflammatory changes to suggest an acute diverticulitis at this time. Normal appendix. Vascular/Lymphatic: Aortic atherosclerosis with mild fusiform aneurysmal dilatation of the infrarenal abdominal aorta which measures up to 3.2 x 2.9 cm. No lymphadenopathy noted in the abdomen or pelvis. Reproductive: Prostate gland and seminal vesicles are unremarkable in  appearance. Other: No significant volume of ascites.  No pneumoperitoneum. Musculoskeletal: 1 cm sclerotic lesion with narrow zone of transition in the anterior aspect of the left ilium, stable compared to prior examinations, most compatible with a small  bone island. There are no other aggressive appearing lytic or blastic lesions noted in the visualized portions of the skeleton. IMPRESSION: 1. Severe right upper lobe bronchopneumonia. 2. No acute findings noted in the abdomen or pelvis. 3. Colonic diverticulosis without evidence of acute diverticulitis at this time. 4. Aortic atherosclerosis, in addition to left main and 3 vessel coronary artery disease. Please note that although the presence of coronary artery calcium documents the presence of coronary artery disease, the severity of this disease and any potential stenosis cannot be assessed on this non-gated CT examination. Assessment for potential risk factor modification, dietary therapy or pharmacologic therapy may be warranted, if clinically indicated. 5. In addition, there is mild fusiform aneurysmal dilatation of the infrarenal abdominal aorta which measures up to 3.2 x 2.9 cm. Recommend followup by ultrasound in 3 years. This recommendation follows ACR consensus guidelines: White Paper of the ACR Incidental Findings Committee II on Vascular Findings. J Am Coll Radiol 2013; 10:789-794. 6. Emphysema with bullous changes in the apex of the right lung, similar to prior examinations. 7. Additional incidental findings, as above. Electronically Signed   By: Vinnie Langton M.D.   On: 11/23/2018 14:14        Scheduled Meds: . aspirin EC  325 mg Oral Daily  . atorvastatin  40 mg Oral Daily  . brimonidine  1 drop Both Eyes BID   And  . dorzolamide  1 drop Both Eyes BID  . budesonide (PULMICORT) nebulizer solution  0.5 mg Nebulization BID  . cholecalciferol  2,000 Units Oral Daily  . diltiazem  180 mg Oral Daily  . feeding supplement (ENSURE ENLIVE)  237 mL Oral BID BM  . finasteride  5 mg Oral Daily  . fluticasone  2 spray Each Nare Daily  . gabapentin  300 mg Oral TID  . insulin aspart  0-15 Units Subcutaneous TID WC  . insulin aspart  0-5 Units Subcutaneous QHS  . insulin glargine  16 Units  Subcutaneous QHS  . ipratropium-albuterol  3 mL Nebulization Q6H  . latanoprost  1 drop Both Eyes QHS  . levETIRAcetam  750 mg Oral BID  . lisinopril  10 mg Oral Daily  . loratadine  10 mg Oral Daily  . mirabegron ER  25 mg Oral Daily  . mirtazapine  7.5 mg Oral QHS  . nicotine  7 mg Transdermal Daily  . pantoprazole  40 mg Oral Daily  . protein supplement shake  11 oz Oral BID BM  . tamsulosin  0.4 mg Oral QPC supper  . venlafaxine XR  150 mg Oral Q breakfast  . vitamin B-12  500 mcg Oral Daily   Continuous Infusions: . sodium chloride 75 mL/hr at 11/24/18 0848  . azithromycin    . cefTRIAXone (ROCEPHIN)  IV 1 g (11/24/18 1155)     LOS: 1 day    Time spent: 35 minutes    Irine Seal, MD [facility] Pager [phone]  If 7PM-7AM, please contact night-coverage [email or web address] Password [email or web address] 11/24/2018, 1:09 PM

## 2018-11-25 ENCOUNTER — Inpatient Hospital Stay (HOSPITAL_COMMUNITY): Payer: No Typology Code available for payment source

## 2018-11-25 DIAGNOSIS — R109 Unspecified abdominal pain: Secondary | ICD-10-CM

## 2018-11-25 LAB — GLUCOSE, CAPILLARY
Glucose-Capillary: 188 mg/dL — ABNORMAL HIGH (ref 70–99)
Glucose-Capillary: 194 mg/dL — ABNORMAL HIGH (ref 70–99)
Glucose-Capillary: 111 mg/dL — ABNORMAL HIGH (ref 70–99)
Glucose-Capillary: 185 mg/dL — ABNORMAL HIGH (ref 70–99)

## 2018-11-25 LAB — BASIC METABOLIC PANEL
Anion gap: 6 (ref 5–15)
BUN: 13 mg/dL (ref 8–23)
CALCIUM: 8.9 mg/dL (ref 8.9–10.3)
CO2: 26 mmol/L (ref 22–32)
Chloride: 110 mmol/L (ref 98–111)
Creatinine, Ser: 0.76 mg/dL (ref 0.61–1.24)
GFR calc non Af Amer: >60 mL/min (ref >60)
Glucose, Bld: 129 mg/dL — ABNORMAL HIGH (ref 70–99)
Potassium: 4 mmol/L (ref 3.5–5.1)
SODIUM: 142 mmol/L (ref 135–145)

## 2018-11-25 LAB — CBC WITH DIFFERENTIAL/PLATELET
Abs Immature Granulocytes: 0.04 10*3/uL (ref 0.00–0.07)
Basophils Absolute: 0 10*3/uL (ref 0.0–0.1)
Basophils Relative: 0 %
EOS ABS: 0.1 10*3/uL (ref 0.0–0.5)
Eosinophils Relative: 1 %
HCT: 40.7 % (ref 39.0–52.0)
Hemoglobin: 12.5 g/dL — ABNORMAL LOW (ref 13.0–17.0)
IMMATURE GRANULOCYTES: 1 %
LYMPHS ABS: 1.7 10*3/uL (ref 0.7–4.0)
Lymphocytes Relative: 20 %
MCH: 26.8 pg (ref 26.0–34.0)
MCHC: 30.7 g/dL (ref 30.0–36.0)
MCV: 87.2 fL (ref 80.0–100.0)
Monocytes Absolute: 0.6 10*3/uL (ref 0.1–1.0)
Monocytes Relative: 7 %
Neutro Abs: 5.9 10*3/uL (ref 1.7–7.7)
Neutrophils Relative %: 71 %
Platelets: 166 10*3/uL (ref 150–400)
RBC: 4.67 MIL/uL (ref 4.22–5.81)
RDW: 13.8 % (ref 11.5–15.5)
WBC: 8.3 10*3/uL (ref 4.0–10.5)
nRBC: 0 % (ref 0.0–0.2)

## 2018-11-25 LAB — LEGIONELLA PNEUMOPHILA SEROGP 1 UR AG: L. pneumophila Serogp 1 Ur Ag: NEGATIVE

## 2018-11-25 MED ORDER — IPRATROPIUM-ALBUTEROL 0.5-2.5 (3) MG/3ML IN SOLN: 3.0000 mL | RESPIRATORY_TRACT | Status: DC | PRN

## 2018-11-25 MED ORDER — IPRATROPIUM-ALBUTEROL 0.5-2.5 (3) MG/3ML IN SOLN
3.0000 mL | Freq: Three times a day (TID) | RESPIRATORY_TRACT | Status: DC
  Administered 2018-11-26 (×2): 3 mL via RESPIRATORY_TRACT
  Filled 2018-11-25 (×3): qty 3

## 2018-11-25 MED ORDER — SIMETHICONE 80 MG PO CHEW
160.0000 mg | CHEWABLE_TABLET | Freq: Four times a day (QID) | ORAL | Status: DC
  Administered 2018-11-25 – 2018-11-27 (×7): 160 mg via ORAL
  Filled 2018-11-25 (×7): qty 2

## 2018-11-25 MED ORDER — PANTOPRAZOLE SODIUM 40 MG PO TBEC
40.0000 mg | DELAYED_RELEASE_TABLET | Freq: Two times a day (BID) | ORAL | Status: DC
  Administered 2018-11-25 – 2018-11-27 (×4): 40 mg via ORAL
  Filled 2018-11-25 (×4): qty 1

## 2018-11-25 NOTE — Progress Notes (Signed)
PT demonstrated hands on understanding of Flutter device- pc at this time (PT states much better).

## 2018-11-25 NOTE — Progress Notes (Signed)
PT continues to demonstrate hands on understanding of Flutter device- pc at this time.

## 2018-11-25 NOTE — Progress Notes (Signed)
PROGRESS NOTE    Wesley [name]  ZOX:[id number] DOB: 08/17/1956 DOA: 11/23/2018 PCP: [facility]   Brief Narrative:  Patient is a 62 year old gentleman history of hypertension, hyperlipidemia, prior CVA with left-sided residual weakness, COPD, ongoing tobacco abuse, essential tremors, GERD, seizure and increased intraocular pressure, BPH presented to the ED with fever altered mental status, intermittent productive spells lasting 3 to 4 days.  Patient with ongoing hematuria being followed by urology service at [facility].  Chest x-ray done concerning for acute infiltrate.  Patient admitted placed empirically on IV antibiotics.   Assessment & Plan:   Principal Problem:   CAP (community acquired pneumonia) Active Problems:   Essential hypertension   Tobacco use disorder   Anxiety and depression   BPH (benign prostatic hyperplasia)   HLD (hyperlipidemia)   SSS (sick sinus syndrome) (HCC)   Pneumonia of right middle lobe due to infectious organism (HCC)   Seizures (HCC)   Hematuria   Coronary artery calcification seen on CT scan   Metabolic encephalopathy  1 community-acquired pneumonia Patient presented with fevers, altered mental status, chest x-ray consistent with a pneumonia. CURB 65 > 2.  Patient pancultured results pending.  Urine Legionella antigen negative.  Urine pneumococcus antigen negative.  Continue current regimen of Pulmicort, Flonase, Claritin, scheduled duo nebs, empiric IV Rocephin and azithromycin.  If continued improvement likely transition to oral antibiotics tomorrow after gastric emptying study.  Follow.  2.  Hypertension Stable.  Continue Cardizem, Proscar, lisinopril, Flomax.  Follow.  3.  History of seizures Stable.  No seizures noted.  Continue Keppra.  4.  BPH Stable.  Patient with good urine output.  Continue Proscar, Flomax.  5.  Hematuria CT abdomen and pelvis which was done on admission was unremarkable for any acute abnormalities.   Bladder was unremarkable.  Hemoglobin currently at 11.8.  Outpatient follow-up with urology at the [facility].  6.  Depression/anxiety Stable.  Continue Effexor.  7.  Tobacco abuse Tobacco cessation.  Continue nicotine patch.   8.  Hyperlipidemia Continue statin.  9.  Sick sinus syndrome status post PPM Stable.  Outpatient follow-up with cardiology.  10.  Diabetes mellitus type 2 Hemoglobin A1c 6.7.  CBG was 188 this morning.  Continue Lantus 16 units daily.  Continue sliding scale insulin.  Outpatient follow-up with PCP.  11.  Increased intraocular pressure Stable.  Continue xalatan, Alphagan, Trusopt.  12.  Metabolic encephalopathy Likely secondary to pneumonia.  Improving clinically.  Continue empiric IV antibiotics.    13.  Abdominal bloating/discomfort Patient with complaints of abdominal bloating/discomfort.  Patient with complaints of a feeling of regurgitation sometimes when he eats.  Patient passing flatus.  Check abdominal films.  Check a gastric emptying study to rule out gastroparesis as patient with history of diabetes.  Place on scheduled simethicone.  Increase PPI to twice daily.  Follow.   DVT prophylaxis: SCDs Code Status: Full Family Communication: Updated patient and wife at bedtime. Disposition Plan: Home when clinically improved and on oral antibiotics.   Consultants:   None  Procedures:   CT abdomen and pelvis 11/23/2018  CT head 11/23/2018  CT chest 11/23/2018  CXR 11/23/2018  Antimicrobials:   IV azithromycin 11/23/2018  IV Rocephin 11/23/2018   Subjective: Patient just finished working with physical therapy and complained of some lightheadedness and weakness.  Patient denies any chest pain.  Feels shortness of breath is improving.  Cough improving.  Patient with complaints of early satiety, bloating sensation, feeling of possible regurgitation after  eating that he states has been ongoing for several months to a year now.  Patient  states had a bowel movement the day prior to admission.  Patient passing flatus.   Objective: Vitals:   11/24/18 1958 11/24/18 2239 11/25/18 0604 11/25/18 0814  BP:  122/80 126/68   Pulse:  65 69   Resp:  17 18   Temp:  98.1 F (36.7 C) 98.3 F (36.8 C)   TempSrc:  Oral Oral   SpO2: 95% 96% 97% 95%  Weight:      Height:        Intake/Output Summary (Last 24 hours) at 11/25/2018 1201 Last data filed at 11/25/2018 1010 Gross per 24 hour  Intake 1327.63 ml  Output 3820 ml  Net -2492.37 ml   Filed Weights   11/23/18 1839  Weight: 96.8 kg    Examination:  General exam: NAD Respiratory system: Some scattered coarse breath sounds.  No crackles.  No wheezing.  Speaking in full sentences.  Respiratory effort normal. Cardiovascular system: Regular rate and rhythm no murmurs rubs or gallops.  No JVD.  No lower extremity edema.  Gastrointestinal system: Abdomen is mildly distended, soft, nontender to palpation, positive bowel sounds.  No rebound.  No guarding. Central nervous system: Alert and oriented. No focal neurological deficits. Extremities: Symmetric 5 x 5 power. Skin: No rashes, lesions or ulcers Psychiatry: Judgement and insight appear normal. Mood & affect appropriate.     Data Reviewed: I have personally reviewed following labs and imaging studies  CBC: Recent Labs  Lab 11/23/18 0952 11/24/18 0921 11/25/18 0416  WBC 14.0* 10.4 8.3  NEUTROABS 12.3* 8.2* 5.9  HGB 13.2 11.8* 12.5*  HCT 42.9 39.3 40.7  MCV 88.8 88.1 87.2  PLT 168 147* 570   Basic Metabolic Panel: Recent Labs  Lab 11/23/18 0952 11/24/18 0921 11/25/18 0416  NA 135 139 142  K 4.3 3.7 4.0  CL 102 107 110  CO2 24 24 26   GLUCOSE 212* 188* 129*  BUN 21 13 13   CREATININE 0.98 0.74 0.76  CALCIUM 9.2 8.6* 8.9   GFR: Estimated Creatinine Clearance: 111.3 mL/min (by C-G formula based on SCr of 0.76 mg/dL). Liver Function Tests: Recent Labs  Lab 11/23/18 0952  AST 60*  ALT 65*  ALKPHOS  108  BILITOT 0.8  PROT 6.8  ALBUMIN 4.1   No results for input(s): LIPASE, AMYLASE in the last 168 hours. No results for input(s): AMMONIA in the last 168 hours. Coagulation Profile: No results for input(s): INR, PROTIME in the last 168 hours. Cardiac Enzymes: No results for input(s): CKTOTAL, CKMB, CKMBINDEX, TROPONINI in the last 168 hours. BNP (last 3 results) No results for input(s): PROBNP in the last 8760 hours. HbA1C: Recent Labs    11/23/18 1605  HGBA1C 6.7*   CBG: Recent Labs  Lab 11/24/18 0745 11/24/18 1159 11/24/18 1734 11/24/18 2148 11/25/18 0741  GLUCAP 117* 185* 164* 176* 188*   Lipid Profile: No results for input(s): CHOL, HDL, LDLCALC, TRIG, CHOLHDL, LDLDIRECT in the last 72 hours. Thyroid Function Tests: No results for input(s): TSH, T4TOTAL, FREET4, T3FREE, THYROIDAB in the last 72 hours. Anemia Panel: No results for input(s): VITAMINB12, FOLATE, FERRITIN, TIBC, IRON, RETICCTPCT in the last 72 hours. Sepsis Labs: Recent Labs  Lab 11/23/18 1029 11/23/18 1237  LATICACIDVEN 2.52* 1.49    Recent Results (from the past 240 hour(s))  Blood Culture (routine x 2)     Status: None (Preliminary result)   Collection Time: 11/23/18  9:52  AM  Result Value Ref Range Status   Specimen Description   Final    BLOOD RIGHT ARM Performed at [facility] [address], [place], [place] [address]    Special Requests   Final    BOTTLES DRAWN AEROBIC AND ANAEROBIC Blood Culture adequate volume Performed at [facility] [address], [place], [place] [address]    Culture   Final    NO GROWTH 1 DAY Performed at [facility], [place] [address], [place], [place] [address]    Report Status PENDING  Incomplete  Urine culture     Status: None   Collection Time: 11/23/18  9:53 AM  Result Value Ref Range Status   Specimen Description   Final    URINE, RANDOM Performed at [facility] [address], [place], [place] [address]    Special Requests   Final    NONE Performed at [facility], [place] [address], [place], [place] [address]    Culture   Final    NO GROWTH Performed at [facility], [place] [address], [place], [place] [address]    Report Status 11/24/2018 FINAL  Final  Blood Culture (routine x 2)     Status: None (Preliminary result)   Collection Time: 11/23/18 10:58 AM  Result Value Ref Range Status   Specimen Description   Final    BLOOD LEFT ANTECUBITAL Performed at [facility] [address] [place], [place] [address]    Special Requests   Final    BOTTLES DRAWN AEROBIC AND ANAEROBIC Blood Culture results may not be optimal due to an excessive volume of blood received in culture bottles Performed at [facility] [address], [place], [place] [address]    Culture   Final    NO GROWTH 1 DAY Performed at [facility], [place] [address], [place],  [address]    Report Status PENDING  Incomplete         Radiology Studies: Ct Head Wo Contrast  Result Date: 11/23/2018 CLINICAL DATA:  Altered level of consciousness. EXAM: CT HEAD WITHOUT CONTRAST TECHNIQUE: Contiguous axial images were obtained from the base of the skull through the vertex without intravenous contrast. COMPARISON:  CT scan of September 28, 2018. FINDINGS: Brain: Mild diffuse cortical atrophy is noted. No mass effect or midline shift is noted. Ventricular size is within normal limits. There is no evidence of mass lesion, hemorrhage or acute infarction. Vascular: No hyperdense vessel or unexpected calcification. Skull: Normal. Negative for fracture or focal lesion. Sinuses/Orbits: No acute finding. Other: None. IMPRESSION: Mild diffuse cortical atrophy. No acute intracranial abnormality seen. Electronically Signed   By: Marijo Conception, M.D.   On: 11/23/2018 14:05   Ct Chest W Contrast  Result Date: 11/23/2018 CLINICAL DATA:   62 year old male with history of altered mental status. Hematuria for the past week. History of urinary tract infections. EXAM: CT CHEST, ABDOMEN, AND PELVIS WITH CONTRAST TECHNIQUE: Multidetector CT imaging of the chest, abdomen and pelvis was performed following the standard protocol during bolus administration of intravenous contrast. CONTRAST:  122mL ISOVUE-300 IOPAMIDOL (ISOVUE-300) INJECTION 61% COMPARISON:  CT the chest, abdomen and pelvis 02/20/2018. FINDINGS: CT CHEST FINDINGS Cardiovascular: Heart size is normal. There is no significant pericardial fluid, thickening or pericardial calcification. There is aortic atherosclerosis, as well as atherosclerosis of the great vessels of the mediastinum and the coronary arteries, including calcified atherosclerotic plaque  in the left main, left anterior descending, left circumflex and right coronary arteries. Left-sided pacemaker with lead tips terminating in the right atrium and right ventricular apex. Mediastinum/Nodes: No pathologically enlarged mediastinal or hilar lymph nodes. Esophagus is unremarkable in appearance. No axillary lymphadenopathy. Lungs/Pleura: Compared to the prior examination there is new peribronchovascular airspace consolidation throughout the right upper lobe, most compatible with severe bronchopneumonia. A few scattered 2-3 mm pulmonary nodules are noted in the lungs bilaterally, stable compared to prior examinations, considered benign. No larger more suspicious appearing pulmonary nodules or masses are noted. Diffuse bronchial wall thickening with mild centrilobular and moderate to severe paraseptal emphysema, including extensive bullous changes in the apex of the right lung. Musculoskeletal: There are no aggressive appearing lytic or blastic lesions noted in the visualized portions of the skeleton. CT ABDOMEN PELVIS FINDINGS Hepatobiliary: No suspicious cystic or solid hepatic lesions. No intra or extrahepatic biliary ductal dilatation.  Gallbladder is normal in appearance. Pancreas: No pancreatic mass. No pancreatic ductal dilatation. No pancreatic or peripancreatic fluid or inflammatory changes. Spleen: Calcified granuloma in the spleen.  Otherwise, unremarkable. Adrenals/Urinary Tract: Bilateral kidneys and bilateral adrenal glands are normal in appearance. No hydroureteronephrosis. Urinary bladder is normal in appearance. Stomach/Bowel: Normal appearance of the stomach. No pathologic dilatation of small bowel or colon. A few scattered colonic diverticulae are noted, without surrounding inflammatory changes to suggest an acute diverticulitis at this time. Normal appendix. Vascular/Lymphatic: Aortic atherosclerosis with mild fusiform aneurysmal dilatation of the infrarenal abdominal aorta which measures up to 3.2 x 2.9 cm. No lymphadenopathy noted in the abdomen or pelvis. Reproductive: Prostate gland and seminal vesicles are unremarkable in appearance. Other: No significant volume of ascites.  No pneumoperitoneum. Musculoskeletal: 1 cm sclerotic lesion with narrow zone of transition in the anterior aspect of the left ilium, stable compared to prior examinations, most compatible with a small bone island. There are no other aggressive appearing lytic or blastic lesions noted in the visualized portions of the skeleton. IMPRESSION: 1. Severe right upper lobe bronchopneumonia. 2. No acute findings noted in the abdomen or pelvis. 3. Colonic diverticulosis without evidence of acute diverticulitis at this time. 4. Aortic atherosclerosis, in addition to left main and 3 vessel coronary artery disease. Please note that although the presence of coronary artery calcium documents the presence of coronary artery disease, the severity of this disease and any potential stenosis cannot be assessed on this non-gated CT examination. Assessment for potential risk factor modification, dietary therapy or pharmacologic therapy may be warranted, if clinically indicated.  5. In addition, there is mild fusiform aneurysmal dilatation of the infrarenal abdominal aorta which measures up to 3.2 x 2.9 cm. Recommend followup by ultrasound in 3 years. This recommendation follows ACR consensus guidelines: White Paper of the ACR Incidental Findings Committee II on Vascular Findings. J Am Coll Radiol 2013; 10:789-794. 6. Emphysema with bullous changes in the apex of the right lung, similar to prior examinations. 7. Additional incidental findings, as above. Electronically Signed   By: Vinnie Langton M.D.   On: 11/23/2018 14:14   Ct Abdomen Pelvis W Contrast  Result Date: 11/23/2018 CLINICAL DATA:  62 year old male with history of altered mental status. Hematuria for the past week. History of urinary tract infections. EXAM: CT CHEST, ABDOMEN, AND PELVIS WITH CONTRAST TECHNIQUE: Multidetector CT imaging of the chest, abdomen and pelvis was performed following the standard protocol during bolus administration of intravenous contrast. CONTRAST:  144mL ISOVUE-300 IOPAMIDOL (ISOVUE-300) INJECTION 61% COMPARISON:  CT the chest, abdomen and  pelvis 02/20/2018. FINDINGS: CT CHEST FINDINGS Cardiovascular: Heart size is normal. There is no significant pericardial fluid, thickening or pericardial calcification. There is aortic atherosclerosis, as well as atherosclerosis of the great vessels of the mediastinum and the coronary arteries, including calcified atherosclerotic plaque in the left main, left anterior descending, left circumflex and right coronary arteries. Left-sided pacemaker with lead tips terminating in the right atrium and right ventricular apex. Mediastinum/Nodes: No pathologically enlarged mediastinal or hilar lymph nodes. Esophagus is unremarkable in appearance. No axillary lymphadenopathy. Lungs/Pleura: Compared to the prior examination there is new peribronchovascular airspace consolidation throughout the right upper lobe, most compatible with severe bronchopneumonia. A few scattered  2-3 mm pulmonary nodules are noted in the lungs bilaterally, stable compared to prior examinations, considered benign. No larger more suspicious appearing pulmonary nodules or masses are noted. Diffuse bronchial wall thickening with mild centrilobular and moderate to severe paraseptal emphysema, including extensive bullous changes in the apex of the right lung. Musculoskeletal: There are no aggressive appearing lytic or blastic lesions noted in the visualized portions of the skeleton. CT ABDOMEN PELVIS FINDINGS Hepatobiliary: No suspicious cystic or solid hepatic lesions. No intra or extrahepatic biliary ductal dilatation. Gallbladder is normal in appearance. Pancreas: No pancreatic mass. No pancreatic ductal dilatation. No pancreatic or peripancreatic fluid or inflammatory changes. Spleen: Calcified granuloma in the spleen.  Otherwise, unremarkable. Adrenals/Urinary Tract: Bilateral kidneys and bilateral adrenal glands are normal in appearance. No hydroureteronephrosis. Urinary bladder is normal in appearance. Stomach/Bowel: Normal appearance of the stomach. No pathologic dilatation of small bowel or colon. A few scattered colonic diverticulae are noted, without surrounding inflammatory changes to suggest an acute diverticulitis at this time. Normal appendix. Vascular/Lymphatic: Aortic atherosclerosis with mild fusiform aneurysmal dilatation of the infrarenal abdominal aorta which measures up to 3.2 x 2.9 cm. No lymphadenopathy noted in the abdomen or pelvis. Reproductive: Prostate gland and seminal vesicles are unremarkable in appearance. Other: No significant volume of ascites.  No pneumoperitoneum. Musculoskeletal: 1 cm sclerotic lesion with narrow zone of transition in the anterior aspect of the left ilium, stable compared to prior examinations, most compatible with a small bone island. There are no other aggressive appearing lytic or blastic lesions noted in the visualized portions of the skeleton. IMPRESSION:  1. Severe right upper lobe bronchopneumonia. 2. No acute findings noted in the abdomen or pelvis. 3. Colonic diverticulosis without evidence of acute diverticulitis at this time. 4. Aortic atherosclerosis, in addition to left main and 3 vessel coronary artery disease. Please note that although the presence of coronary artery calcium documents the presence of coronary artery disease, the severity of this disease and any potential stenosis cannot be assessed on this non-gated CT examination. Assessment for potential risk factor modification, dietary therapy or pharmacologic therapy may be warranted, if clinically indicated. 5. In addition, there is mild fusiform aneurysmal dilatation of the infrarenal abdominal aorta which measures up to 3.2 x 2.9 cm. Recommend followup by ultrasound in 3 years. This recommendation follows ACR consensus guidelines: White Paper of the ACR Incidental Findings Committee II on Vascular Findings. J Am Coll Radiol 2013; 10:789-794. 6. Emphysema with bullous changes in the apex of the right lung, similar to prior examinations. 7. Additional incidental findings, as above. Electronically Signed   By: Vinnie Langton M.D.   On: 11/23/2018 14:14        Scheduled Meds: . aspirin EC  325 mg Oral Daily  . atorvastatin  40 mg Oral Daily  . brimonidine  1 drop Both Eyes  BID   And  . dorzolamide  1 drop Both Eyes BID  . budesonide (PULMICORT) nebulizer solution  0.5 mg Nebulization BID  . cholecalciferol  2,000 Units Oral Daily  . diltiazem  180 mg Oral Daily  . feeding supplement (ENSURE ENLIVE)  237 mL Oral BID BM  . finasteride  5 mg Oral Daily  . fluticasone  2 spray Each Nare Daily  . gabapentin  300 mg Oral TID  . insulin aspart  0-15 Units Subcutaneous TID WC  . insulin aspart  0-5 Units Subcutaneous QHS  . insulin glargine  16 Units Subcutaneous QHS  . ipratropium-albuterol  3 mL Nebulization Q6H WA  . latanoprost  1 drop Both Eyes QHS  . levETIRAcetam  750 mg Oral  BID  . lisinopril  10 mg Oral Daily  . loratadine  10 mg Oral Daily  . mirabegron ER  25 mg Oral Daily  . mirtazapine  7.5 mg Oral QHS  . nicotine  7 mg Transdermal Daily  . pantoprazole  40 mg Oral BID AC  . protein supplement shake  11 oz Oral BID BM  . simethicone  160 mg Oral QID  . tamsulosin  0.4 mg Oral QPC supper  . venlafaxine XR  150 mg Oral Q breakfast  . vitamin B-12  500 mcg Oral Daily   Continuous Infusions: . sodium chloride 75 mL/hr at 11/25/18 0012  . azithromycin 250 mL/hr at 11/24/18 1600  . cefTRIAXone (ROCEPHIN)  IV 1 g (11/25/18 1047)     LOS: 2 days    Time spent: 35 minutes    Irine Seal, MD [facility] Pager [phone]  If 7PM-7AM, please contact night-coverage [email or web address] Password [email or web address] 11/25/2018, 12:01 PM

## 2018-11-25 NOTE — Progress Notes (Deleted)
CBG 60 at present time, no s/s of hypoglycemia.

## 2018-11-25 NOTE — Progress Notes (Signed)
OT Cancellation Note  Patient Details Name: Wesley [name] MRN: [id number] DOB: 01-17-1956   Cancelled Treatment:    Reason Eval/Treat Not Completed: Patient at procedure or test/ unavailable Pt out of room for x-ray at this time. Will continue to follow as available and appropriate to initiate OT POC.   Zenovia Jarred, MSOT, OTR/L Behavioral Health OT/ Acute Relief OT PHP Office: [place] 11/25/2018, 5:12 PM

## 2018-11-25 NOTE — Evaluation (Signed)
Physical Therapy Evaluation Patient Details Name: Wesley [name] MRN: [id number] DOB: 1956/11/10 Today's Date: 11/25/2018   History of Present Illness  62 yo male admitted on 33/29 for PNA, metabolic encephalopathy, UTI/hematuria. PMH includes OA, BPH, cataracts, COPD, HLD, HTN, R sided weakness secondary to stroke, substance abuse, MI, pacemaker, ShOB, tremors, UTI and sepsis 02/2018, OSA, R numbness in foot and LE, LBP, anxiety/depression, memory loss, seizures, DMII.   Clinical Impression   Pt presents with dyspnea on exertion recovered with rest breaks, decreased activity tolerance, LE generalized weakness, impaired standing balance, and increased time and effort to perform all mobility tasks. Pt to benefit from acute PT to address deficits. Pt ambulated 200 ft with RW with min guard assist for safety, required 3 standing rest breaks to recover dyspnea. On room air during session, pt maintained sats >94%.  PT to progress mobility as tolerated, and will continue to follow acutely.      Follow Up Recommendations Home health PT;Supervision for mobility/OOB    Equipment Recommendations  None recommended by PT    Recommendations for Other Services       Precautions / Restrictions Precautions Precautions: Fall Restrictions Weight Bearing Restrictions: No      Mobility  Bed Mobility Overal bed mobility: Needs Assistance Bed Mobility: Supine to Sit     Supine to sit: HOB elevated;Min guard     General bed mobility comments: Min guard for safety, pt with increased time and effort to perform. Pt stating lightheadedness/headache which has been present all day. Pt with improved symptoms after a few seconds in sitting.  Transfers Overall transfer level: Needs assistance Equipment used: Rolling walker (2 wheeled) Transfers: Sit to/from Stand Sit to Stand: Min assist         General transfer comment: Min guard for safety, pt with increased time to rise. Pt again reporting  headache/lightheadness, had a difficult time explaining this to PT. passed momentarily.   Ambulation/Gait Ambulation/Gait assistance: Min guard Gait Distance (Feet): 200 Feet Assistive device: Rolling walker (2 wheeled) Gait Pattern/deviations: Step-through pattern;Decreased stride length;Trunk flexed Gait velocity: decr   General Gait Details: Min guard for safety. Pt requiring 3 standing rest breaks during ambulation. Pt with forward leaning on RW for rest to use accessory muscles to recover dyspnea 2/4. Sats >=94% on room air during ambulation.  Stairs            Wheelchair Mobility    Modified Rankin (Stroke Patients Only)       Balance Overall balance assessment: Needs assistance;History of Falls Sitting-balance support: No upper extremity supported Sitting balance-Leahy Scale: Good     Standing balance support: Bilateral upper extremity supported Standing balance-Leahy Scale: Poor Standing balance comment: relies on RW for support                             Pertinent Vitals/Pain Pain Assessment: No/denies pain    Home Living Family/patient expects to be discharged to:: Private residence Living Arrangements: Spouse/significant other Available Help at Discharge: Family;Available PRN/intermittently(wife works during the day, pt used to have advanced home care for PT and RN ) Type of Home: House Home Access: Stairs to enter Entrance Stairs-Rails: Right Entrance Stairs-Number of Steps: 3 Home Layout: One level Home Equipment: [place] - 4 wheels;Cane - quad      Prior Function Level of Independence: Needs assistance   Gait / Transfers Assistance Needed: uses rollator for ambulation   ADL's / Homemaking Assistance Needed:  pt occasionally needs assist with dressing and bathing   Comments: prior HHPT/HHRN to assist and work ond deficits     Hand Dominance   Dominant Hand: Right    Extremity/Trunk Assessment   Upper Extremity Assessment Upper  Extremity Assessment: Defer to OT evaluation    Lower Extremity Assessment Lower Extremity Assessment: Generalized weakness;RLE deficits/detail(strength generally 3/5 in LEs, some difficulty with hip flexion R>L) RLE Sensation: decreased light touch(foot and lower leg )    Cervical / Trunk Assessment Cervical / Trunk Assessment: Normal  Communication   Communication: No difficulties  Cognition Arousal/Alertness: Awake/alert Behavior During Therapy: Flat affect Overall Cognitive Status: Impaired/Different from baseline Area of Impairment: Problem solving                             Problem Solving: Slow processing;Requires tactile cues;Requires verbal cues        General Comments General comments (skin integrity, edema, etc.): Pt reports that his numbness is on the R side due to stroke, not L as chart review stated.     Exercises     Assessment/Plan    PT Assessment Patient needs continued PT services  PT Problem List Decreased strength;Cardiopulmonary status limiting activity;Decreased range of motion;Decreased balance;Decreased activity tolerance;Decreased mobility       PT Treatment Interventions DME instruction;Therapeutic activities;Gait training;Therapeutic exercise;Patient/family education;Stair training;Balance training;Functional mobility training    PT Goals (Current goals can be found in the Care Plan section)  Acute Rehab PT Goals Patient Stated Goal: none stated  PT Goal Formulation: With patient Time For Goal Achievement: 12/09/18 Potential to Achieve Goals: Good    Frequency Min 3X/week   Barriers to discharge        Co-evaluation               AM-PAC PT "6 Clicks" Mobility  Outcome Measure Help needed turning from your back to your side while in a flat bed without using bedrails?: None Help needed moving from lying on your back to sitting on the side of a flat bed without using bedrails?: A Little Help needed moving to and from  a bed to a chair (including a wheelchair)?: A Little Help needed standing up from a chair using your arms (e.g., wheelchair or bedside chair)?: A Little Help needed to walk in hospital room?: A Little Help needed climbing 3-5 steps with a railing? : A Little 6 Click Score: 19    End of Session Equipment Utilized During Treatment: Gait belt(Pt on room air for duration of session ) Activity Tolerance: Patient limited by fatigue Patient left: in chair;with chair alarm set;with call bell/phone within reach;with family/visitor present Nurse Communication: Mobility status PT Visit Diagnosis: History of falling (Z91.81)    Time: 5427-0623 PT Time Calculation (min) (ACUTE ONLY): 27 min   Charges:   PT Evaluation $PT Eval Low Complexity: 1 Low PT Treatments $Gait Training: 8-22 mins       Julien Girt, PT Acute Rehabilitation Services Pager [phone]  Office [phone]  Feather Berrie D Zahava Quant 11/25/2018, 2:07 PM

## 2018-11-26 ENCOUNTER — Inpatient Hospital Stay (HOSPITAL_COMMUNITY): Payer: No Typology Code available for payment source

## 2018-11-26 DIAGNOSIS — K567 Ileus, unspecified: Secondary | ICD-10-CM | POA: Clinically undetermined

## 2018-11-26 LAB — GLUCOSE, CAPILLARY
Glucose-Capillary: 123 mg/dL — ABNORMAL HIGH (ref 70–99)
Glucose-Capillary: 91 mg/dL (ref 70–99)
Glucose-Capillary: 153 mg/dL — ABNORMAL HIGH (ref 70–99)
Glucose-Capillary: 123 mg/dL — ABNORMAL HIGH (ref 70–99)

## 2018-11-26 LAB — CBC
HCT: 40.3 % (ref 39.0–52.0)
Hemoglobin: 12.7 g/dL — ABNORMAL LOW (ref 13.0–17.0)
MCH: 27.7 pg (ref 26.0–34.0)
MCHC: 31.5 g/dL (ref 30.0–36.0)
MCV: 88 fL (ref 80.0–100.0)
Platelets: 165 10*3/uL (ref 150–400)
RBC: 4.58 MIL/uL (ref 4.22–5.81)
RDW: 13.8 % (ref 11.5–15.5)
WBC: 6.6 10*3/uL (ref 4.0–10.5)
nRBC: 0 % (ref 0.0–0.2)

## 2018-11-26 LAB — MAGNESIUM: Magnesium: 2.1 mg/dL (ref 1.7–2.4)

## 2018-11-26 LAB — BASIC METABOLIC PANEL
Anion gap: 9 (ref 5–15)
BUN: 13 mg/dL (ref 8–23)
CO2: 24 mmol/L (ref 22–32)
CREATININE: 0.73 mg/dL (ref 0.61–1.24)
Calcium: 8.8 mg/dL — ABNORMAL LOW (ref 8.9–10.3)
Chloride: 109 mmol/L (ref 98–111)
GFR calc Af Amer: >60 mL/min (ref >60)
GFR calc non Af Amer: >60 mL/min (ref >60)
Glucose, Bld: 147 mg/dL — ABNORMAL HIGH (ref 70–99)
POTASSIUM: 3.8 mmol/L (ref 3.5–5.1)
Sodium: 142 mmol/L (ref 135–145)

## 2018-11-26 MED ORDER — POTASSIUM CHLORIDE CRYS ER 20 MEQ PO TBCR
40.0000 meq | EXTENDED_RELEASE_TABLET | Freq: Once | ORAL | Status: AC
  Administered 2018-11-26: 40 meq via ORAL
  Filled 2018-11-26: qty 2

## 2018-11-26 MED ORDER — POTASSIUM CHLORIDE 10 MEQ/100ML IV SOLN
10.0000 meq | INTRAVENOUS | Status: DC
  Filled 2018-11-26 (×4): qty 100

## 2018-11-26 MED ORDER — TECHNETIUM TC 99M SULFUR COLLOID
2.1000 | Freq: Once | INTRAVENOUS | Status: AC | PRN
  Administered 2018-11-26: 2.1 via INTRAVENOUS

## 2018-11-26 MED ORDER — CEFDINIR 300 MG PO CAPS
300.0000 mg | ORAL_CAPSULE | Freq: Two times a day (BID) | ORAL | Status: DC
  Administered 2018-11-27: 300 mg via ORAL
  Filled 2018-11-26 (×2): qty 1

## 2018-11-26 MED ORDER — IPRATROPIUM-ALBUTEROL 0.5-2.5 (3) MG/3ML IN SOLN
3.0000 mL | Freq: Two times a day (BID) | RESPIRATORY_TRACT | Status: DC
  Administered 2018-11-27: 3 mL via RESPIRATORY_TRACT
  Filled 2018-11-26: qty 3

## 2018-11-26 MED ORDER — BISACODYL 10 MG RE SUPP: 10.0000 mg | Freq: Every day | RECTAL | Status: DC

## 2018-11-26 NOTE — Progress Notes (Signed)
Soaps suds enema given with large bowel movement results. Patient denies any further abdominal discomfort or distention.

## 2018-11-26 NOTE — Progress Notes (Signed)
PT continues to demonstrate hands on understanding of Flutter device- pc at this time.

## 2018-11-26 NOTE — Progress Notes (Signed)
PROGRESS NOTE    Wesley [name]  QPY:[id number] DOB: January 27, 1956 DOA: 11/23/2018 PCP: [facility]   Brief Narrative:  Patient is a 62 year old gentleman history of hypertension, hyperlipidemia, prior CVA with left-sided residual weakness, COPD, ongoing tobacco abuse, essential tremors, GERD, seizure and increased intraocular pressure, BPH presented to the ED with fever altered mental status, intermittent productive spells lasting 3 to 4 days.  Patient with ongoing hematuria being followed by urology service at [facility].  Chest x-ray done concerning for acute infiltrate.  Patient admitted placed empirically on IV antibiotics.   Assessment & Plan:   Principal Problem:   CAP (community acquired pneumonia) Active Problems:   Ileus ([place])   Essential hypertension   Tobacco use disorder   Anxiety and depression   BPH (benign prostatic hyperplasia)   HLD (hyperlipidemia)   SSS (sick sinus syndrome) (HCC)   Pneumonia of right middle lobe due to infectious organism (HCC)   Seizures (HCC)   Hematuria   Coronary artery calcification seen on CT scan   Metabolic encephalopathy  1 community-acquired pneumonia Patient presented with fevers, altered mental status, chest x-ray consistent with a pneumonia. CURB 65 > 2.  Patient pancultured results pending.  Urine Legionella antigen negative.  Urine pneumococcus antigen negative.  Continue current regimen of Pulmicort, Flonase, Claritin, scheduled duo nebs, empiric IV Rocephin and azithromycin.  Transition to oral antibiotics tomorrow.  Follow.  2.  Hypertension Stable.  Continue Cardizem, Proscar, lisinopril, Flomax.  Follow.  3.  History of seizures Stable.  No seizures noted.  Continue Keppra.  4.  BPH Stable.  Patient with good urine output of 2.6 L over the past 24 hours.  Continue Proscar, Flomax.  5.  Hematuria CT abdomen and pelvis which was done on admission was unremarkable for any acute abnormalities.  Bladder was  unremarkable.  Hemoglobin currently at 12.7.  Outpatient follow-up with [facility] at the [facility].  6.  Depression/anxiety Continue Effexor.   7.  Tobacco abuse Tobacco cessation.  Continue nicotine patch.   8.  Hyperlipidemia Continue statin.  9.  Sick sinus syndrome status post PPM Stable.  Outpatient follow-up with cardiology.  10.  Diabetes mellitus type 2 Hemoglobin A1c 6.7.  CBG was 123 this morning.  Continue Lantus 16 units daily.  Continue sliding scale insulin.  Outpatient follow-up with PCP.  11.  Increased intraocular pressure Stable.  Continue xalatan, Alphagan, Trusopt.  12.  Metabolic encephalopathy Likely secondary to pneumonia.  Clinical improvement.  Continue empiric IV antibiotics and likely transition to oral antibiotics tomorrow.   13.  Abdominal bloating/discomfort Patient with complaints of abdominal bloating/discomfort.  Patient with complaints of a feeling of regurgitation sometimes when he eats.  Patient passing flatus.  Abdominal films ordered on 11/25/2018 were initially concerning for ileus.  Repeat abdominal films with normal bowel gas pattern.  Abdominal film this morning also concerning for constipation.  Patient has just returned from gastric emptying study to rule out gastroparesis results pending.  Continue scheduled simethicone and PPI twice daily.  Keep potassium greater than 4.  Keep magnesium greater than 2.  14. ??  Ileus Ileus noted per abdominal films of 11/25/2018.  Repeat abdominal films this morning 11/26/2018 with resolution.  Keep potassium greater than 4.  Keep magnesium greater than 2.   DVT prophylaxis: SCDs Code Status: Full Family Communication: Updated patient and wife at bedtime. Disposition Plan: Home when clinically improved and on oral antibiotics.   Consultants:   None  Procedures:   CT  abdomen and pelvis 11/23/2018  CT head 11/23/2018  CT chest 11/23/2018  CXR 11/23/2018  Abdominal films 11/25/2018,  11/26/2018  Gastric emptying study 11/26/2018  Antimicrobials:   IV azithromycin 11/23/2018  IV Rocephin 11/23/2018   Subjective: Patient just returning from gastric emptying study.  Patient does endorse passing flatus.  No emesis.  Some nausea.  Has been n.p.o. since midnight and as such has not had any oral intake yet.  Feels shortness of breath is improving.  Feels weakness improving.  Denies any chest pain.   Objective: Vitals:   11/25/18 1336 11/25/18 1926 11/25/18 2109 11/26/18 0550  BP: 129/77  132/79 (!) 151/80  Pulse: 61  60 64  Resp: 15  18 18   Temp: 98.1 F (36.7 C)  98.1 F (36.7 C) 98.3 F (36.8 C)  TempSrc: Oral  Oral Oral  SpO2: 94% 94% 96% 97%  Weight:      Height:        Intake/Output Summary (Last 24 hours) at 11/26/2018 1335 Last data filed at 11/26/2018 0427 Gross per 24 hour  Intake 1972.79 ml  Output 1700 ml  Net 272.79 ml   Filed Weights   11/23/18 1839  Weight: 96.8 kg    Examination:  General exam: NAD Respiratory system: Decreased coarse breath sounds.  No crackles.  No wheezing.  Speaking in full sentences.  Normal respiratory effort.  Cardiovascular system: RRR no murmurs rubs or gallops.  No JVD.  No lower extremity edema.  Gastrointestinal system: Abdomen is mildly distended, soft, nontender to palpation, positive bowel sounds.  No rebound.  No guarding.  Central nervous system: Alert and oriented. No focal neurological deficits. Extremities: Symmetric 5 x 5 power. Skin: No rashes, lesions or ulcers Psychiatry: Judgement and insight appear normal. Mood & affect appropriate.     Data Reviewed: I have personally reviewed following labs and imaging studies  CBC: Recent Labs  Lab 11/23/18 0952 11/24/18 0921 11/25/18 0416 11/26/18 0457  WBC 14.0* 10.4 8.3 6.6  NEUTROABS 12.3* 8.2* 5.9  --   HGB 13.2 11.8* 12.5* 12.7*  HCT 42.9 39.3 40.7 40.3  MCV 88.8 88.1 87.2 88.0  PLT 168 147* 166 989   Basic Metabolic Panel: Recent  Labs  Lab 11/23/18 0952 11/24/18 0921 11/25/18 0416 11/26/18 0457  NA 135 139 142 142  K 4.3 3.7 4.0 3.8  CL 102 107 110 109  CO2 24 24 26 24   GLUCOSE 212* 188* 129* 147*  BUN 21 13 13 13   CREATININE 0.98 0.74 0.76 0.73  CALCIUM 9.2 8.6* 8.9 8.8*  MG  --   --   --  2.1   GFR: Estimated Creatinine Clearance: 111.3 mL/min (by C-G formula based on SCr of 0.73 mg/dL). Liver Function Tests: Recent Labs  Lab 11/23/18 0952  AST 60*  ALT 65*  ALKPHOS 108  BILITOT 0.8  PROT 6.8  ALBUMIN 4.1   No results for input(s): LIPASE, AMYLASE in the last 168 hours. No results for input(s): AMMONIA in the last 168 hours. Coagulation Profile: No results for input(s): INR, PROTIME in the last 168 hours. Cardiac Enzymes: No results for input(s): CKTOTAL, CKMB, CKMBINDEX, TROPONINI in the last 168 hours. BNP (last 3 results) No results for input(s): PROBNP in the last 8760 hours. HbA1C: Recent Labs    11/23/18 1605  HGBA1C 6.7*   CBG: Recent Labs  Lab 11/25/18 0741 11/25/18 1206 11/25/18 1727 11/25/18 2106 11/26/18 0854  GLUCAP 188* 194* 111* 185* 123*   Lipid Profile:  No results for input(s): CHOL, HDL, LDLCALC, TRIG, CHOLHDL, LDLDIRECT in the last 72 hours. Thyroid Function Tests: No results for input(s): TSH, T4TOTAL, FREET4, T3FREE, THYROIDAB in the last 72 hours. Anemia Panel: No results for input(s): VITAMINB12, FOLATE, FERRITIN, TIBC, IRON, RETICCTPCT in the last 72 hours. Sepsis Labs: Recent Labs  Lab 11/23/18 1029 11/23/18 1237  LATICACIDVEN 2.52* 1.49    Recent Results (from the past 240 hour(s))  Blood Culture (routine x 2)     Status: None (Preliminary result)   Collection Time: 11/23/18  9:52 AM  Result Value Ref Range Status   Specimen Description   Final    BLOOD RIGHT ARM Performed at [facility] [address] [place], [place] [address]    Special Requests   Final    BOTTLES DRAWN AEROBIC AND ANAEROBIC Blood Culture adequate  volume Performed at [facility] [address], [place], [place] [address]    Culture   Final    NO GROWTH 3 DAYS Performed at [facility], [place] [address], [place], [place] [address]    Report Status PENDING  Incomplete  Urine culture     Status: None   Collection Time: 11/23/18  9:53 AM  Result Value Ref Range Status   Specimen Description   Final    URINE, RANDOM Performed at [facility] [address], [place], [place] [address]    Special Requests   Final    NONE Performed at [facility], [place] [address] [place], [place] [address]    Culture   Final    NO GROWTH Performed at [facility], [place] [address], [place], [place] [address]    Report Status 11/24/2018 FINAL  Final  Blood Culture (routine x 2)     Status: None (Preliminary result)   Collection Time: 11/23/18 10:58 AM  Result Value Ref Range Status   Specimen Description   Final    BLOOD LEFT ANTECUBITAL Performed at [facility] [address] [place], [place] [address]    Special Requests   Final    BOTTLES DRAWN AEROBIC AND ANAEROBIC Blood Culture results may not be optimal due to an excessive volume of blood received in culture bottles Performed at [facility] [address], [place], [place] [address]    Culture   Final    NO GROWTH 3 DAYS Performed at [facility], [place] [address], [place], [place] [address]    Report Status PENDING  Incomplete         Radiology Studies: Nm Gastric Emptying  Result Date: 11/26/2018 CLINICAL DATA:  Abdominal distension EXAM: NUCLEAR MEDICINE GASTRIC EMPTYING SCAN TECHNIQUE: After oral ingestion of radiolabeled meal, sequential abdominal images were obtained for 4 hours. Percentage of activity emptying the stomach was calculated at 1 hour, 2 hour, 3 hour, and 4 hours. RADIOPHARMACEUTICALS:  2.1 mCi Tc-71m sulfur colloid in standardized meal  COMPARISON:  None. FINDINGS: Expected location of the stomach in the left upper quadrant. Ingested meal empties the stomach gradually over the course of the study. 79.27% emptied at 1 hr ( normal >= 10%) 87.17% emptied at 2 hr ( normal >= 40%) 93.73% emptied at 3 hr ( normal >= 70%) IMPRESSION: Normal gastric emptying study. Electronically Signed   By: Dorise Bullion III M.D   On: 11/26/2018 12:45   Dg Abd 2 Views  Result Date: 11/26/2018 CLINICAL DATA:  Ileus EXAM: ABDOMEN -  2 VIEW COMPARISON:  11/25/2018 FINDINGS: Improved bowel-gas pattern with decreasing bowel distention. Large stool burden throughout the colon. No organomegaly or free air. IMPRESSION: Improved bowel gas pattern. No current evidence for obstruction or ileus. Large stool burden throughout the colon. Electronically Signed   By: Rolm Baptise M.D.   On: 11/26/2018 08:34   Dg Abd 2 Views  Result Date: 11/25/2018 CLINICAL DATA:  Abdominal distension EXAM: ABDOMEN - 2 VIEW COMPARISON:  CT 11/23/2018 FINDINGS: Mild gaseous dilatation of small and large bowel with multiple fluid levels on upright examination. Negative for free air. No radiopaque calculi. IMPRESSION: Mild gaseous dilatation of small and large bowel with multiple fluid levels suggesting ileus. Electronically Signed   By: Donavan Foil M.D.   On: 11/25/2018 17:22        Scheduled Meds: . aspirin EC  325 mg Oral Daily  . atorvastatin  40 mg Oral Daily  . bisacodyl  10 mg Rectal Daily  . brimonidine  1 drop Both Eyes BID   And  . dorzolamide  1 drop Both Eyes BID  . budesonide (PULMICORT) nebulizer solution  0.5 mg Nebulization BID  . cholecalciferol  2,000 Units Oral Daily  . diltiazem  180 mg Oral Daily  . feeding supplement (ENSURE ENLIVE)  237 mL Oral BID BM  . finasteride  5 mg Oral Daily  . fluticasone  2 spray Each Nare Daily  . gabapentin  300 mg Oral TID  . insulin aspart  0-15 Units Subcutaneous TID WC  . insulin aspart  0-5 Units Subcutaneous QHS    . insulin glargine  16 Units Subcutaneous QHS  . ipratropium-albuterol  3 mL Nebulization TID  . latanoprost  1 drop Both Eyes QHS  . levETIRAcetam  750 mg Oral BID  . lisinopril  10 mg Oral Daily  . loratadine  10 mg Oral Daily  . mirabegron ER  25 mg Oral Daily  . mirtazapine  7.5 mg Oral QHS  . nicotine  7 mg Transdermal Daily  . pantoprazole  40 mg Oral BID AC  . potassium chloride  40 mEq Oral Once  . protein supplement shake  11 oz Oral BID BM  . simethicone  160 mg Oral QID  . tamsulosin  0.4 mg Oral QPC supper  . venlafaxine XR  150 mg Oral Q breakfast  . vitamin B-12  500 mcg Oral Daily   Continuous Infusions: . sodium chloride 75 mL/hr at 11/26/18 0427  . azithromycin Stopped (11/25/18 1558)  . cefTRIAXone (ROCEPHIN)  IV Stopped (11/25/18 1118)     LOS: 3 days    Time spent: 35 minutes    Irine Seal, MD [facility] Pager [phone]  If 7PM-7AM, please contact night-coverage [email or web address] Password [email or web address] 11/26/2018, 1:35 PM

## 2018-11-26 NOTE — Progress Notes (Signed)
OT Cancellation Note  Patient Details Name: Wesley [name] MRN: [id number] DOB: [birth year]   Cancelled Treatment:    Reason Eval/Treat Not Completed: Patient at procedure or test/ unavailable  Will check back on pt as schedule allows  Kari Baars, [place] [phone] Office- [phone], Edwena Felty D 11/26/2018, 1:30 PM

## 2018-11-27 DIAGNOSIS — K567 Ileus, unspecified: Secondary | ICD-10-CM

## 2018-11-27 DIAGNOSIS — K5900 Constipation, unspecified: Secondary | ICD-10-CM

## 2018-11-27 DIAGNOSIS — R140 Abdominal distension (gaseous): Secondary | ICD-10-CM

## 2018-11-27 DIAGNOSIS — K219 Gastro-esophageal reflux disease without esophagitis: Secondary | ICD-10-CM

## 2018-11-27 DIAGNOSIS — E669 Obesity, unspecified: Secondary | ICD-10-CM

## 2018-11-27 DIAGNOSIS — E1169 Type 2 diabetes mellitus with other specified complication: Secondary | ICD-10-CM

## 2018-11-27 LAB — CBC WITH DIFFERENTIAL/PLATELET
Abs Immature Granulocytes: 0.05 10*3/uL (ref 0.00–0.07)
Basophils Absolute: 0 10*3/uL (ref 0.0–0.1)
Basophils Relative: 1 %
Eosinophils Absolute: 0.2 10*3/uL (ref 0.0–0.5)
Eosinophils Relative: 4 %
HCT: 41.4 % (ref 39.0–52.0)
Hemoglobin: 13 g/dL (ref 13.0–17.0)
Immature Granulocytes: 1 %
Lymphocytes Relative: 33 %
Lymphs Abs: 2.1 10*3/uL (ref 0.7–4.0)
MCH: 27.1 pg (ref 26.0–34.0)
MCHC: 31.4 g/dL (ref 30.0–36.0)
MCV: 86.3 fL (ref 80.0–100.0)
Monocytes Absolute: 0.4 10*3/uL (ref 0.1–1.0)
Monocytes Relative: 6 %
NEUTROS PCT: 55 %
Neutro Abs: 3.6 10*3/uL (ref 1.7–7.7)
Platelets: 216 10*3/uL (ref 150–400)
RBC: 4.8 MIL/uL (ref 4.22–5.81)
RDW: 13.6 % (ref 11.5–15.5)
WBC: 6.4 10*3/uL (ref 4.0–10.5)
nRBC: 0 % (ref 0.0–0.2)

## 2018-11-27 LAB — GLUCOSE, CAPILLARY
GLUCOSE-CAPILLARY: 116 mg/dL — AB (ref 70–99)
Glucose-Capillary: 141 mg/dL — ABNORMAL HIGH (ref 70–99)
Glucose-Capillary: 180 mg/dL — ABNORMAL HIGH (ref 70–99)

## 2018-11-27 LAB — BASIC METABOLIC PANEL
ANION GAP: 10 (ref 5–15)
BUN: 17 mg/dL (ref 8–23)
CO2: 23 mmol/L (ref 22–32)
Calcium: 8.7 mg/dL — ABNORMAL LOW (ref 8.9–10.3)
Chloride: 105 mmol/L (ref 98–111)
Creatinine, Ser: 0.81 mg/dL (ref 0.61–1.24)
GFR calc Af Amer: >60 mL/min (ref >60)
GFR calc non Af Amer: >60 mL/min (ref >60)
Glucose, Bld: 202 mg/dL — ABNORMAL HIGH (ref 70–99)
Potassium: 4 mmol/L (ref 3.5–5.1)
SODIUM: 138 mmol/L (ref 135–145)

## 2018-11-27 MED ORDER — SENNOSIDES-DOCUSATE SODIUM 8.6-50 MG PO TABS
1.0000 | ORAL_TABLET | Freq: Two times a day (BID) | ORAL | Status: DC
  Administered 2018-11-27: 1 via ORAL
  Filled 2018-11-27: qty 1

## 2018-11-27 MED ORDER — PANTOPRAZOLE SODIUM 40 MG PO TBEC: 40.0000 mg | DELAYED_RELEASE_TABLET | Freq: Two times a day (BID) | ORAL | 0 refills | Status: DC

## 2018-11-27 MED ORDER — SIMETHICONE 80 MG PO CHEW: 160.0000 mg | CHEWABLE_TABLET | Freq: Four times a day (QID) | ORAL | 0 refills | Status: DC | PRN

## 2018-11-27 MED ORDER — CEFDINIR 300 MG PO CAPS: 300.0000 mg | ORAL_CAPSULE | Freq: Two times a day (BID) | ORAL | 0 refills | Status: AC

## 2018-11-27 MED ORDER — NICOTINE 7 MG/24HR TD PT24: 7.0000 mg | MEDICATED_PATCH | Freq: Every day | TRANSDERMAL | 0 refills | Status: DC

## 2018-11-27 MED ORDER — SENNA 8.6 MG PO TABS: 1.0000 | ORAL_TABLET | Freq: Two times a day (BID) | ORAL | 0 refills | Status: DC

## 2018-11-27 MED ORDER — INSULIN GLARGINE 100 UNIT/ML ~~LOC~~ SOLN: 20.0000 [IU] | Freq: Every day | SUBCUTANEOUS | 0 refills | Status: DC

## 2018-11-27 MED ORDER — POLYETHYLENE GLYCOL 3350 17 G PO PACK
17.0000 g | PACK | Freq: Every day | ORAL | Status: DC
  Administered 2018-11-27: 17 g via ORAL
  Filled 2018-11-27: qty 1

## 2018-11-27 MED ORDER — CHLORHEXIDINE GLUCONATE 0.12 % MT SOLN
15.0000 mL | Freq: Four times a day (QID) | OROMUCOSAL | Status: DC
  Administered 2018-11-27: 15 mL via OROMUCOSAL
  Filled 2018-11-27: qty 15

## 2018-11-27 NOTE — Care Management Note (Addendum)
Case Management Note  Patient Details  Name: ORVA GWALTNEY MRN: [id number] Date of Birth: 07/07/1956  Subjective/Objective:   CAP, Ileus, HTN                 Action/Plan: NCM spoke to pt and states he goes to [facility] He has a RW at home. Will fax [place] orders, dc summary and facesheet to [facility]. [facility] to arrange follow up appt and will schedule [facility] with [facility] provider. Pt gets his medications through the [place]. Provided pt with a goodrx coupon for Omnicef at [employer] $4.94 or [facility] $10.  Pt will need to pick up abx today.   Expected Discharge Date:  11/27/18               Expected Discharge Plan:  [place]  In-House Referral:  NA  Discharge planning Services  CM Consult  Post Acute Care Choice:  Home Health Choice offered to:  Patient  DME Arranged:  N/A DME Agency:  NA  HH Arranged:  PT, OT [place] Agency:  Other - See comment  Status of Service:  Completed, signed off  If discussed at [place] of Stay Meetings, dates discussed:    Additional Comments:  Erenest Rasher, RN 11/27/2018, 4:23 PM

## 2018-11-27 NOTE — Discharge Summary (Addendum)
Physician Discharge Summary  Wesley [name]:[id number] DOB: 1956/02/04 DOA: 11/23/2018  PCP: [facility]  Admit date: 11/23/2018 Discharge date: 11/27/2018  Time spent: 60 minutes  Recommendations for Outpatient Follow-up:  1. Follow-up with [facility] in 2 weeks.  On follow-up patient will need basic metabolic profile done to follow-up on electrolytes and renal function.  Patient's constipation will need to be reassessed. 2. Follow-up with urology at the [facility] as previously scheduled.   Discharge Diagnoses:  Principal Problem:   CAP (community acquired pneumonia) Active Problems:   Ileus ([place])   Essential hypertension   Tobacco use disorder   Anxiety and depression   BPH (benign prostatic hyperplasia)   HLD (hyperlipidemia)   SSS (sick sinus syndrome) (HCC)   Pneumonia of right middle lobe due to infectious organism (HCC)   Seizures (HCC)   Hematuria   Coronary artery calcification seen on CT scan   Metabolic encephalopathy   Abdominal distension (gaseous)   Gastroesophageal reflux disease   Constipation   Discharge Condition: Stable and improved  Diet recommendation: Carb modified diet  Filed Weights   11/23/18 1839 11/27/18 0512  Weight: 96.8 kg 101.6 kg    History of present illness:  Per Dr. Brown Human is a 62 y.o. male with PMH significant for HTN, HLD, prior CVA with left residual weakness, COPD, ongoing tobacco abuse, essential tremor, GERD, seizure and increased intraocular pressure; who presented to ED due to fever and AMS. Patient reported some intermittent productive coughing spells for the last 3-4 days; and on day of admission, just not feeling himself. Wife reported ongoing hematuria (follow by urology service at [facility]) and hx of recurrent UTI's; she though he had another infection in his urine on day of admission. No CP, no nausea, no vomiting, no abd pain, no new focal weakness, melena, hematochezia or any  other complaints.  In ED patient found to be febrile, with tachypnea, oriented to person and place, but with poor insight. UA with positive RBC's and glucosuria. CXR demonstrating acute infiltrates. Cultures taken and patient started on antibiotics, TR[facility] called to admit patient for further evaluation and management.   Hospital Course:  1 community-acquired pneumonia Patient presented with fevers, altered mental status, chest x-ray consistent with a pneumonia. CURB 65 > 2.  Patient pancultured with results with no growth to date during the hospitalization.  Urine Legionella antigen negative.  Urine pneumococcus antigen negative.    Patient was placed on Pulmicort, Flonase, Claritin, scheduled duo nebs, empiric IV Rocephin and azithromycin.   Patient tolerated antibiotics.  Patient improved clinically.   Patient was subsequently transitioned to oral Omnicef which he tolerated.  Patient noted to have sats of 96% on room air by day of discharge.  Patient be discharged home on 3 more days of oral Omnicef to complete a 7-day course of antibiotic treatment.  Patient was discharged home in stable and improved condition.  Outpatient follow-up with PCP.   2.  Hypertension Stable.    Patient was maintained on home regimen of Cardizem, Proscar, lisinopril, Flomax.   3.  History of seizures Stable.  No seizures noted.    Patient maintained on home regimen of Keppra.  4.  BPH Stable.  Patient with good urine output  during the hospitalization.  Patient maintained on home regimen of Proscar, Flomax.  5.  Hematuria CT abdomen and pelvis which was done on admission was unremarkable for any acute abnormalities.  Bladder was unremarkable.  Hemoglobin remained stable  at 13.0 by day of discharge. Outpatient follow-up with urology at the [facility].  6.  Depression/anxiety Continued on home regimen of Effexor.   7.  Tobacco abuse Tobacco cessation.    Patient placed on a nicotine patch.    8.   Hyperlipidemia Continued on home regimen of statin.  9.  Sick sinus syndrome status post PPM Stable.  Outpatient follow-up with cardiology.  10.  Diabetes mellitus type 2 Hemoglobin A1c 6.7.    Patient's Lantus dose was adjusted during the hospitalization to 16 units daily with CBGs less than 180.  Patient be discharged home on a reduced dose of his Lantus at 20 units daily.  Outpatient follow-up with PCP.  11.  Increased intraocular pressure Stable.  Continued on home regimen of Xalatan, Alphagan, Trusopt.  12.  Metabolic encephalopathy Likely secondary to pneumonia. Patient improved clinically during the hospitalization and was close to baseline by day of discharge.   13.  Abdominal bloating/discomfort secondary to constipation Patient with complaints of abdominal bloating/discomfort during the hospitalization.  Patient with complaints of a feeling of regurgitation sometimes when he eats.  Patient passing flatus.  Abdominal films ordered on 11/25/2018 were initially concerning for ileus.  Repeat abdominal films with normal bowel gas pattern.  Abdominal film done on 11/26/2018 concerning for constipation.  Patient underwent a gastric emptying study on 11/26/2018 which was normal.  Patient was given a soapsuds enema and placed on a bowel regimen of MiraLAX 17 g daily as well as Senokot-S twice daily.  Patient was also given some simethicone and PPI increased to twice daily.  Patient had a large bowel movement improved clinically significantly will be discharged home on a bowel regimen.  Outpatient follow-up with PCP.   14. ??  Ileus Ileus noted per abdominal films of 11/25/2018.  Repeat abdominal films the morning 11/26/2018 with resolution.    Patient's electrolytes were repleted.    Procedures:  CT abdomen and pelvis 11/23/2018  CT head 11/23/2018  CT chest 11/23/2018  CXR 11/23/2018  Abdominal films 11/25/2018, 11/26/2018  Gastric emptying study  11/26/2018  Consultations:  None  Discharge Exam: Vitals:   11/27/18 0750 11/27/18 1320  BP:  (!) 142/79  Pulse:  60  Resp:  16  Temp:  98.4 F (36.9 C)  SpO2: 95% 96%    General: NAD Cardiovascular:RRR Respiratory:  Decreased coarse breath sounds in the base.  No wheezing.  Fair air movement.  Speaking in full sentences.   Discharge Instructions   Discharge Instructions    Diet Carb Modified   Complete by:  As directed    Increase activity slowly   Complete by:  As directed      Allergies as of 11/27/2018      Reactions   Penicillins Swelling   "General swelling" Has patient had a PCN reaction causing immediate rash, facial/tongue/throat swelling, SOB or lightheadedness with hypotension: Yes Has patient had a PCN reaction causing severe rash involving mucus membranes or skin necrosis: Unk Has patient had a PCN reaction that required hospitalization: Yes Has patient had a PCN reaction occurring within the last 10 years: No If all of the above answers are "NO", then may proceed with Cephalosporin use.   Levaquin [levofloxacin In D5w] Hives      Medication List    TAKE these medications   ACCU-CHEK AVIVA PLUS w/Device Kit 1 Device by Does not apply route 4 (four) times daily.   albuterol (2.5 MG/3ML) 0.083% nebulizer solution Commonly known as:  PROVENTIL  Take 2.5 mg by nebulization 2 (two) times daily as needed for wheezing or shortness of breath.   albuterol 108 (90 Base) MCG/ACT inhaler Commonly known as:  PROVENTIL HFA;VENTOLIN HFA Inhale 2 puffs into the lungs every 6 (six) hours as needed for wheezing or shortness of breath.   aspirin 325 MG tablet Take 325 mg by mouth daily.   atorvastatin 40 MG tablet Commonly known as:  LIPITOR Take 40 mg by mouth daily.   Brimonidine-Dorzolamide 0.15-2 % Soln Place 1 drop into both eyes 2 (two) times daily.   carboxymethylcellulose 0.5 % Soln Commonly known as:  REFRESH PLUS Place 1 drop into both eyes at  bedtime as needed (for irritation).   cefdinir 300 MG capsule Commonly known as:  OMNICEF Take 1 capsule (300 mg total) by mouth every 12 (twelve) hours for 3 days. Start taking on:  November 28, 2018   cetirizine 10 MG tablet Commonly known as:  ZYRTEC Take 1 tablet (10 mg total) by mouth daily.   chlorpheniramine 4 MG tablet Commonly known as:  CHLOR-TRIMETON Take 4 mg by mouth daily as needed for allergies.   cyclobenzaprine 10 MG tablet Commonly known as:  FLEXERIL Take 10 mg by mouth daily.   diclofenac sodium 1 % Gel Commonly known as:  VOLTAREN Apply 4 g topically 4 (four) times daily. TO AFFECTED AREAS   diltiazem 180 MG 24 hr capsule Commonly known as:  DILACOR XR Take 180 mg by mouth daily.   ENSURE PLUS Liqd Take 237 mLs by mouth 2 (two) times daily between meals.   finasteride 5 MG tablet Commonly known as:  PROSCAR Take 5 mg by mouth daily.   fluticasone 50 MCG/ACT nasal spray Commonly known as:  FLONASE Place 2 sprays into both nostrils daily.   gabapentin 300 MG capsule Commonly known as:  NEURONTIN Take 300 mg by mouth 3 (three) times daily.   glucose blood test strip Commonly known as:  ACCU-CHEK AVIVA Use as instructed   HUGO ROLLING WALKER BASIC Misc 1 each by Does not apply route daily as needed.   [employer] Misc 1 each by Does not apply route as needed.   insulin glargine 100 UNIT/ML injection Commonly known as:  LANTUS Inject 0.2 mLs (20 Units total) into the skin at bedtime. What changed:  how much to take   latanoprost 0.005 % ophthalmic solution Commonly known as:  XALATAN Place 1 drop into both eyes at bedtime.   levETIRAcetam 750 MG tablet Commonly known as:  KEPPRA Take 750 mg by mouth 2 (two) times daily.   lisinopril 10 MG tablet Commonly known as:  PRINIVIL,ZESTRIL Take 1 tablet (10 mg total) by mouth daily.   meloxicam 15 MG tablet Commonly known as:  MOBIC Take 15 mg by mouth daily as needed for pain.    mirtazapine 7.5 MG tablet Commonly known as:  REMERON Take 7.5 mg by mouth at bedtime.   MYRBETRIQ 25 MG Tb24 tablet Generic drug:  mirabegron ER Take 25 mg by mouth daily.   nicotine 7 mg/24hr patch Commonly known as:  NICODERM CQ - dosed in mg/24 hr Place 1 patch (7 mg total) onto the skin daily. Start taking on:  November 28, 2018   PADSORBER BED PAN LINERS Misc 1 each by Does not apply route daily.   pantoprazole 40 MG tablet Commonly known as:  PROTONIX Take 1 tablet (40 mg total) by mouth 2 (two) times daily before a meal. What changed:  when to  take this   polyethylene glycol packet Commonly known as:  MIRALAX / GLYCOLAX Take 17 g by mouth daily. What changed:    when to take this  reasons to take this   protein supplement shake Liqd Commonly known as:  PREMIER PROTEIN Take 325 mLs (11 oz total) by mouth 2 (two) times daily between meals.   senna 8.6 MG Tabs tablet Commonly known as:  SENOKOT Take 1 tablet (8.6 mg total) by mouth 2 (two) times daily. What changed:    when to take this  reasons to take this   simethicone 80 MG chewable tablet Commonly known as:  MYLICON Chew 2 tablets (160 mg total) by mouth 4 (four) times daily as needed for flatulence.   STIOLTO RESPIMAT 2.5-2.5 MCG/ACT Aers Generic drug:  Tiotropium Bromide-Olodaterol Inhale 2 puffs into the lungs daily.   tamsulosin 0.4 MG Caps capsule Commonly known as:  FLOMAX Take 1 capsule (0.4 mg total) by mouth daily after supper.   UNKNOWN TO PATIENT Unnamed nebulizer solution: Nebulize 1 vial every 4-6 hours as needed for shortness of breath   venlafaxine XR 150 MG 24 hr capsule Commonly known as:  EFFEXOR-XR Take 150 mg by mouth daily with breakfast.   vitamin B-12 500 MCG tablet Commonly known as:  CYANOCOBALAMIN Take 500 mcg by mouth daily.   Vitamin D3 50 MCG (2000 UT) Tabs Take 2,000 Units by mouth daily. What changed:  how much to take   Wrist Splint/Elastic Left Lg  Misc 1 each by Does not apply route daily.      Allergies  Allergen Reactions  . Penicillins Swelling    "General swelling" Has patient had a PCN reaction causing immediate rash, facial/tongue/throat swelling, SOB or lightheadedness with hypotension: Yes Has patient had a PCN reaction causing severe rash involving mucus membranes or skin necrosis: Unk Has patient had a PCN reaction that required hospitalization: Yes Has patient had a PCN reaction occurring within the last 10 years: No If all of the above answers are "NO", then may proceed with Cephalosporin use.   Anner Crete In D5w] [place]. Schedule an appointment as soon as possible for a visit in 2 week(s).   Contact information: [place] [address] [phone]        Urology Follow up.   Why:  Follow-up as scheduled.           The results of significant diagnostics from this hospitalization (including imaging, microbiology, ancillary and laboratory) are listed below for reference.    Significant Diagnostic Studies: Dg Chest 2 View  Result Date: 11/23/2018 CLINICAL DATA:  Fever. EXAM: CHEST - 2 VIEW COMPARISON:  Chest x-rays dated 04/24/2018 and 02/19/2018 and chest CT dated 02/20/2018 FINDINGS: There is a new faint right perihilar infiltrate. Extensive bullous emphysema in the right upper lobe. Left lung is clear. Heart size and vascularity are normal. Pacemaker in place. No acute bone abnormality. No effusions. IMPRESSION: New faint right perihilar infiltrate. Electronically Signed   By: Lorriane Shire M.D.   On: 11/23/2018 10:50   Ct Head Wo Contrast  Result Date: 11/23/2018 CLINICAL DATA:  Altered level of consciousness. EXAM: CT HEAD WITHOUT CONTRAST TECHNIQUE: Contiguous axial images were obtained from the base of the skull through the vertex without intravenous contrast. COMPARISON:  CT scan of September 28, 2018.  FINDINGS: Brain: Mild diffuse cortical atrophy is noted. No mass effect or midline shift is noted.  Ventricular size is within normal limits. There is no evidence of mass lesion, hemorrhage or acute infarction. Vascular: No hyperdense vessel or unexpected calcification. Skull: Normal. Negative for fracture or focal lesion. Sinuses/Orbits: No acute finding. Other: None. IMPRESSION: Mild diffuse cortical atrophy. No acute intracranial abnormality seen. Electronically Signed   By: Marijo Conception, M.D.   On: 11/23/2018 14:05   Ct Chest W Contrast  Result Date: 11/23/2018 CLINICAL DATA:  62 year old male with history of altered mental status. Hematuria for the past week. History of urinary tract infections. EXAM: CT CHEST, ABDOMEN, AND PELVIS WITH CONTRAST TECHNIQUE: Multidetector CT imaging of the chest, abdomen and pelvis was performed following the standard protocol during bolus administration of intravenous contrast. CONTRAST:  172m ISOVUE-300 IOPAMIDOL (ISOVUE-300) INJECTION 61% COMPARISON:  CT the chest, abdomen and pelvis 02/20/2018. FINDINGS: CT CHEST FINDINGS Cardiovascular: Heart size is normal. There is no significant pericardial fluid, thickening or pericardial calcification. There is aortic atherosclerosis, as well as atherosclerosis of the great vessels of the mediastinum and the coronary arteries, including calcified atherosclerotic plaque in the left main, left anterior descending, left circumflex and right coronary arteries. Left-sided pacemaker with lead tips terminating in the right atrium and right ventricular apex. Mediastinum/Nodes: No pathologically enlarged mediastinal or hilar lymph nodes. Esophagus is unremarkable in appearance. No axillary lymphadenopathy. Lungs/Pleura: Compared to the prior examination there is new peribronchovascular airspace consolidation throughout the right upper lobe, most compatible with severe bronchopneumonia. A few scattered 2-3 mm pulmonary nodules are noted in  the lungs bilaterally, stable compared to prior examinations, considered benign. No larger more suspicious appearing pulmonary nodules or masses are noted. Diffuse bronchial wall thickening with mild centrilobular and moderate to severe paraseptal emphysema, including extensive bullous changes in the apex of the right lung. Musculoskeletal: There are no aggressive appearing lytic or blastic lesions noted in the visualized portions of the skeleton. CT ABDOMEN PELVIS FINDINGS Hepatobiliary: No suspicious cystic or solid hepatic lesions. No intra or extrahepatic biliary ductal dilatation. Gallbladder is normal in appearance. Pancreas: No pancreatic mass. No pancreatic ductal dilatation. No pancreatic or peripancreatic fluid or inflammatory changes. Spleen: Calcified granuloma in the spleen.  Otherwise, unremarkable. Adrenals/Urinary Tract: Bilateral kidneys and bilateral adrenal glands are normal in appearance. No hydroureteronephrosis. Urinary bladder is normal in appearance. Stomach/Bowel: Normal appearance of the stomach. No pathologic dilatation of small bowel or colon. A few scattered colonic diverticulae are noted, without surrounding inflammatory changes to suggest an acute diverticulitis at this time. Normal appendix. Vascular/Lymphatic: Aortic atherosclerosis with mild fusiform aneurysmal dilatation of the infrarenal abdominal aorta which measures up to 3.2 x 2.9 cm. No lymphadenopathy noted in the abdomen or pelvis. Reproductive: Prostate gland and seminal vesicles are unremarkable in appearance. Other: No significant volume of ascites.  No pneumoperitoneum. Musculoskeletal: 1 cm sclerotic lesion with narrow zone of transition in the anterior aspect of the left ilium, stable compared to prior examinations, most compatible with a small bone island. There are no other aggressive appearing lytic or blastic lesions noted in the visualized portions of the skeleton. IMPRESSION: 1. Severe right upper lobe  bronchopneumonia. 2. No acute findings noted in the abdomen or pelvis. 3. Colonic diverticulosis without evidence of acute diverticulitis at this time. 4. Aortic atherosclerosis, in addition to left main and 3 vessel coronary artery disease. Please note that although the presence of coronary artery calcium documents the presence of coronary artery disease, the severity of this disease and any potential stenosis cannot be assessed on this non-gated CT examination.  Assessment for potential risk factor modification, dietary therapy or pharmacologic therapy may be warranted, if clinically indicated. 5. In addition, there is mild fusiform aneurysmal dilatation of the infrarenal abdominal aorta which measures up to 3.2 x 2.9 cm. Recommend followup by ultrasound in 3 years. This recommendation follows ACR consensus guidelines: White Paper of the ACR Incidental Findings Committee II on Vascular Findings. J Am Coll Radiol 2013; 10:789-794. 6. Emphysema with bullous changes in the apex of the right lung, similar to prior examinations. 7. Additional incidental findings, as above. Electronically Signed   By: Vinnie Langton M.D.   On: 11/23/2018 14:14   Nm Gastric Emptying  Result Date: 11/26/2018 CLINICAL DATA:  Abdominal distension EXAM: NUCLEAR MEDICINE GASTRIC EMPTYING SCAN TECHNIQUE: After oral ingestion of radiolabeled meal, sequential abdominal images were obtained for 4 hours. Percentage of activity emptying the stomach was calculated at 1 hour, 2 hour, 3 hour, and 4 hours. RADIOPHARMACEUTICALS:  2.1 mCi Tc-46msulfur colloid in standardized meal COMPARISON:  None. FINDINGS: Expected location of the stomach in the left upper quadrant. Ingested meal empties the stomach gradually over the course of the study. 79.27% emptied at 1 hr ( normal >= 10%) 87.17% emptied at 2 hr ( normal >= 40%) 93.73% emptied at 3 hr ( normal >= 70%) IMPRESSION: Normal gastric emptying study. Electronically Signed   By: DDorise BullionIII  M.D   On: 11/26/2018 12:45   Ct Abdomen Pelvis W Contrast  Result Date: 11/23/2018 CLINICAL DATA:  62year old male with history of altered mental status. Hematuria for the past week. History of urinary tract infections. EXAM: CT CHEST, ABDOMEN, AND PELVIS WITH CONTRAST TECHNIQUE: Multidetector CT imaging of the chest, abdomen and pelvis was performed following the standard protocol during bolus administration of intravenous contrast. CONTRAST:  1044mISOVUE-300 IOPAMIDOL (ISOVUE-300) INJECTION 61% COMPARISON:  CT the chest, abdomen and pelvis 02/20/2018. FINDINGS: CT CHEST FINDINGS Cardiovascular: Heart size is normal. There is no significant pericardial fluid, thickening or pericardial calcification. There is aortic atherosclerosis, as well as atherosclerosis of the great vessels of the mediastinum and the coronary arteries, including calcified atherosclerotic plaque in the left main, left anterior descending, left circumflex and right coronary arteries. Left-sided pacemaker with lead tips terminating in the right atrium and right ventricular apex. Mediastinum/Nodes: No pathologically enlarged mediastinal or hilar lymph nodes. Esophagus is unremarkable in appearance. No axillary lymphadenopathy. Lungs/Pleura: Compared to the prior examination there is new peribronchovascular airspace consolidation throughout the right upper lobe, most compatible with severe bronchopneumonia. A few scattered 2-3 mm pulmonary nodules are noted in the lungs bilaterally, stable compared to prior examinations, considered benign. No larger more suspicious appearing pulmonary nodules or masses are noted. Diffuse bronchial wall thickening with mild centrilobular and moderate to severe paraseptal emphysema, including extensive bullous changes in the apex of the right lung. Musculoskeletal: There are no aggressive appearing lytic or blastic lesions noted in the visualized portions of the skeleton. CT ABDOMEN PELVIS FINDINGS  Hepatobiliary: No suspicious cystic or solid hepatic lesions. No intra or extrahepatic biliary ductal dilatation. Gallbladder is normal in appearance. Pancreas: No pancreatic mass. No pancreatic ductal dilatation. No pancreatic or peripancreatic fluid or inflammatory changes. Spleen: Calcified granuloma in the spleen.  Otherwise, unremarkable. Adrenals/Urinary Tract: Bilateral kidneys and bilateral adrenal glands are normal in appearance. No hydroureteronephrosis. Urinary bladder is normal in appearance. Stomach/Bowel: Normal appearance of the stomach. No pathologic dilatation of small bowel or colon. A few scattered colonic diverticulae are noted, without surrounding inflammatory changes to suggest  an acute diverticulitis at this time. Normal appendix. Vascular/Lymphatic: Aortic atherosclerosis with mild fusiform aneurysmal dilatation of the infrarenal abdominal aorta which measures up to 3.2 x 2.9 cm. No lymphadenopathy noted in the abdomen or pelvis. Reproductive: Prostate gland and seminal vesicles are unremarkable in appearance. Other: No significant volume of ascites.  No pneumoperitoneum. Musculoskeletal: 1 cm sclerotic lesion with narrow zone of transition in the anterior aspect of the left ilium, stable compared to prior examinations, most compatible with a small bone island. There are no other aggressive appearing lytic or blastic lesions noted in the visualized portions of the skeleton. IMPRESSION: 1. Severe right upper lobe bronchopneumonia. 2. No acute findings noted in the abdomen or pelvis. 3. Colonic diverticulosis without evidence of acute diverticulitis at this time. 4. Aortic atherosclerosis, in addition to left main and 3 vessel coronary artery disease. Please note that although the presence of coronary artery calcium documents the presence of coronary artery disease, the severity of this disease and any potential stenosis cannot be assessed on this non-gated CT examination. Assessment for  potential risk factor modification, dietary therapy or pharmacologic therapy may be warranted, if clinically indicated. 5. In addition, there is mild fusiform aneurysmal dilatation of the infrarenal abdominal aorta which measures up to 3.2 x 2.9 cm. Recommend followup by ultrasound in 3 years. This recommendation follows ACR consensus guidelines: White Paper of the ACR Incidental Findings Committee II on Vascular Findings. J Am Coll Radiol 2013; 10:789-794. 6. Emphysema with bullous changes in the apex of the right lung, similar to prior examinations. 7. Additional incidental findings, as above. Electronically Signed   By: Vinnie Langton M.D.   On: 11/23/2018 14:14   Dg Abd 2 Views  Result Date: 11/26/2018 CLINICAL DATA:  Ileus EXAM: ABDOMEN - 2 VIEW COMPARISON:  11/25/2018 FINDINGS: Improved bowel-gas pattern with decreasing bowel distention. Large stool burden throughout the colon. No organomegaly or free air. IMPRESSION: Improved bowel gas pattern. No current evidence for obstruction or ileus. Large stool burden throughout the colon. Electronically Signed   By: Rolm Baptise M.D.   On: 11/26/2018 08:34   Dg Abd 2 Views  Result Date: 11/25/2018 CLINICAL DATA:  Abdominal distension EXAM: ABDOMEN - 2 VIEW COMPARISON:  CT 11/23/2018 FINDINGS: Mild gaseous dilatation of small and large bowel with multiple fluid levels on upright examination. Negative for free air. No radiopaque calculi. IMPRESSION: Mild gaseous dilatation of small and large bowel with multiple fluid levels suggesting ileus. Electronically Signed   By: Donavan Foil M.D.   On: 11/25/2018 17:22    Microbiology: Recent Results (from the past 240 hour(s))  Blood Culture (routine x 2)     Status: None (Preliminary result)   Collection Time: 11/23/18  9:52 AM  Result Value Ref Range Status   Specimen Description   Final    BLOOD RIGHT ARM Performed at [facility] [address], [place], [place] [address]     Special Requests   Final    BOTTLES DRAWN AEROBIC AND ANAEROBIC Blood Culture adequate volume Performed at [facility] [address] [place], [place] [address]    Culture   Final    NO GROWTH 4 DAYS Performed at [facility], [place] [address] [place],  [address]    Report Status PENDING  Incomplete  Urine culture     Status: None   Collection Time: 11/23/18  9:53 AM  Result Value Ref Range Status   Specimen Description   Final  URINE, RANDOM Performed at [facility], [place] [address], [place], [place] [address]    Special Requests   Final    NONE Performed at [facility], [place] [address] [place], [place] [address]    Culture   Final    NO GROWTH Performed at [facility], [place] [address] [place], [place] [address]    Report Status 11/24/2018 FINAL  Final  Blood Culture (routine x 2)     Status: None (Preliminary result)   Collection Time: 11/23/18 10:58 AM  Result Value Ref Range Status   Specimen Description   Final    BLOOD LEFT ANTECUBITAL Performed at [facility] [address], [place], [place] [address]    Special Requests   Final    BOTTLES DRAWN AEROBIC AND ANAEROBIC Blood Culture results may not be optimal due to an excessive volume of blood received in culture bottles Performed at [facility] [address], [place], [place] [address]    Culture   Final    NO GROWTH 4 DAYS Performed at [facility], [place] [address], [place], [place] [address]    Report Status PENDING  Incomplete     Labs: Basic Metabolic Panel: Recent Labs  Lab 11/23/18 0952 11/24/18 0921 11/25/18 0416 11/26/18 0457 11/27/18 0538  NA 135 139 142 142 138  K 4.3 3.7 4.0 3.8 4.0  CL 102 107 110 109 105  CO2 '24 24 26 24 23  ' GLUCOSE 212* 188* 129* 147* 202*  BUN '21 13 13 13 17  ' CREATININE 0.98 0.74 0.76 0.73 0.81  CALCIUM 9.2 8.6* 8.9 8.8* 8.7*  MG  --   --    --  2.1  --    Liver Function Tests: Recent Labs  Lab 11/23/18 0952  AST 60*  ALT 65*  ALKPHOS 108  BILITOT 0.8  PROT 6.8  ALBUMIN 4.1   No results for input(s): LIPASE, AMYLASE in the last 168 hours. No results for input(s): AMMONIA in the last 168 hours. CBC: Recent Labs  Lab 11/23/18 0952 11/24/18 0921 11/25/18 0416 11/26/18 0457 11/27/18 0538  WBC 14.0* 10.4 8.3 6.6 6.4  NEUTROABS 12.3* 8.2* 5.9  --  3.6  HGB 13.2 11.8* 12.5* 12.7* 13.0  HCT 42.9 39.3 40.7 40.3 41.4  MCV 88.8 88.1 87.2 88.0 86.3  PLT 168 147* 166 165 216   Cardiac Enzymes: No results for input(s): CKTOTAL, CKMB, CKMBINDEX, TROPONINI in the last 168 hours. BNP: BNP (last 3 results) Recent Labs    02/03/18 1813  BNP 5.2    ProBNP (last 3 results) No results for input(s): PROBNP in the last 8760 hours.  CBG: Recent Labs  Lab 11/26/18 1347 11/26/18 1653 11/26/18 2057 11/27/18 0758 11/27/18 1143  GLUCAP 91 153* 123* 141* 180*       Signed:  Irine Seal MD.  [facility] 11/27/2018, 3:53 PM

## 2018-11-30 LAB — CULTURE, BLOOD (ROUTINE X 2)
Culture: NO GROWTH
Culture: NO GROWTH
Special Requests: ADEQUATE

## 2018-12-12 NOTE — Progress Notes (Signed)
Discharge Progress Report  Patient Details  Name: Wesley [name] MRN: [id number] Date of Birth: September 06, 1956 Referring Provider:     Pulmonary Rehab Walk Test from 08/26/2018 in [facility]  Referring Provider  Dr. Nelda Marseille       Number of Visits: 7 exercise sessions and 4 education classes.   Reason for Discharge:  Early Exit:  Lack of attendance   Smoking History:  Social History   Tobacco Use  Smoking Status Light Tobacco Smoker  . Packs/day: 0.10  . Years: 40.00  . Pack years: 4.00  . Types: Cigarettes  . Last attempt to quit: 08/06/2016  . Years since quitting: 2.3  Smokeless Tobacco Never Used  Tobacco Comment   pt still smokes every now and again 1-2 cigarettes    Diagnosis:  Chronic obstructive pulmonary disease, unspecified COPD type ([place])  ADL UCSD: Pulmonary Assessment Scores    Row Name 08/26/18 1622 09/01/18 1523       ADL UCSD   ADL Phase  Entry  Entry    SOB Score total  -  69      CAT Score   CAT Score  -  24      mMRC Score   mMRC Score  3  -       Initial Exercise Prescription: Initial Exercise Prescription - 08/26/18 1600      Date of Initial Exercise RX and Referring Provider   Date  08/26/18    Referring Provider  Dr. Nelda Marseille      NuStep   Level  2    SPM  80    Minutes  17    METs  1.5      Arm Ergometer   Level  2    Watts  10    Minutes  17      Track   Laps  5    Minutes  17      Prescription Details   Frequency (times per week)  2    Duration  Progress to 45 minutes of aerobic exercise without signs/symptoms of physical distress      Intensity   THRR 40-80% of Max Heartrate  63-126    Ratings of Perceived Exertion  11-13    Perceived Dyspnea  0-4      Progression   Progression  Continue to progress workloads to maintain intensity without signs/symptoms of physical distress.      Resistance Training   Training Prescription  Yes    Weight  blue bands    Reps  10-15        Discharge Exercise Prescription (Final Exercise Prescription Changes): Exercise Prescription Changes - 09/28/18 1500      Response to Exercise   Blood Pressure (Admit)  110/72    Blood Pressure (Exercise)  110/80    Blood Pressure (Exit)  110/64    Heart Rate (Admit)  85 bpm    Heart Rate (Exercise)  84 bpm    Heart Rate (Exit)  90 bpm    Oxygen Saturation (Admit)  98 %    Oxygen Saturation (Exercise)  96 %    Oxygen Saturation (Exit)  100 %    Rating of Perceived Exertion (Exercise)  15    Perceived Dyspnea (Exercise)  2    Duration  Progress to 45 minutes of aerobic exercise without signs/symptoms of physical distress    Intensity  Other (comment)   40-80% of HRR     Progression  Progression  Continue to progress workloads to maintain intensity without signs/symptoms of physical distress.      Resistance Training   Training Prescription  Yes    Weight  blue bands    Reps  10-15    Time  10 Minutes      NuStep   Level  3    SPM  80    Minutes  30    METs  2.5      Track   Laps  5    Minutes  17       Functional Capacity: 6 Minute Walk    Row Name 08/26/18 1618         6 Minute Walk   Phase  Initial     Distance  600 feet     Walk Time  - 5 minutes     # of Rest Breaks  1 1 minute     MPH  1.13     METS  1.84     RPE  12     Perceived Dyspnea   2     Symptoms  Yes (comment)     Comments  used rollator/numbness in feet     Resting HR  60 bpm     Resting BP  122/80     Resting Oxygen Saturation   94 %     Exercise Oxygen Saturation  during 6 min walk  92 %     Max Ex. HR  79 bpm     Max Ex. BP  120/74       Interval HR   1 Minute HR  74     2 Minute HR  75     3 Minute HR  74     4 Minute HR  79     5 Minute HR  78     6 Minute HR  76     2 Minute Post HR  80     Interval Heart Rate?  Yes       Interval Oxygen   Interval Oxygen?  Yes     Baseline Oxygen Saturation %  94 %     1 Minute Oxygen Saturation %  92 %     1 Minute Liters of  Oxygen  0 L     2 Minute Oxygen Saturation %  93 %     2 Minute Liters of Oxygen  0 L     3 Minute Oxygen Saturation %  94 %     3 Minute Liters of Oxygen  0 L     4 Minute Oxygen Saturation %  95 %     4 Minute Liters of Oxygen  0 L     5 Minute Oxygen Saturation %  96 %     5 Minute Liters of Oxygen  0 L     6 Minute Oxygen Saturation %  95 %     6 Minute Liters of Oxygen  0 L     2 Minute Post Oxygen Saturation %  95 %     2 Minute Post Liters of Oxygen  0 L        Psychological, QOL, Others - Outcomes: PHQ 2/9: Depression screen [facility] 2/9 08/23/2018 03/30/2017 02/16/2017 01/30/2017 01/19/2017  Decreased Interest _0 Down, Depressed, Hopeless _1 PHQ - 2 Score _2 Altered sleeping _3 3  Tired, decreased energy _0 Change in appetite _1 0  Feeling bad or failure about yourself  _2 Trouble concentrating _3 Moving slowly or fidgety/restless _4 Suicidal thoughts 0 0 _5 PHQ-9 Score _6 Difficult doing work/chores Somewhat difficult - - - -  Some recent data might be hidden    Quality of Life:   Personal Goals: Goals established at orientation with interventions provided to work toward goal. Personal Goals and Risk Factors at Admission - 08/23/18 1453      Core Components/Risk Factors/Patient Goals on Admission    Weight Management  Yes;Weight Maintenance    Intervention  Weight Management/Obesity: Establish reasonable short term and long term weight goals.;Weight Management: Provide education and appropriate resources to help participant work on and attain dietary goals.;Weight Management: Develop a combined nutrition and exercise program designed to reach desired caloric intake, while maintaining appropriate intake of nutrient and fiber, sodium and fats, and appropriate energy expenditure required for the weight goal.    Admit Weight  209 lb 7 oz (95 kg)    Expected Outcomes  Weight Maintenance:  Understanding of the daily nutrition guidelines, which includes 25-35% calories from fat, 7% or less cal from saturated fats, less than 260m cholesterol, less than 1.5gm of sodium, & 5 or more servings of fruits and vegetables daily;Short Term: Continue to assess and modify interventions until short term weight is achieved;Long Term: Adherence to nutrition and physical activity/exercise program aimed toward attainment of established weight goal    Tobacco Cessation  Yes    Number of packs per day  1-2 cigarettes a day    Intervention  Assist the participant in steps to quit. Provide individualized education and counseling about committing to Tobacco Cessation, relapse prevention, and pharmacological support that can be provided by physician.;OAdvice worker assist with locating and accessing local/national Quit Smoking programs, and support quit date choice.    Expected Outcomes  Short Term: Will demonstrate readiness to quit, by selecting a quit date.;Long Term: Complete abstinence from all tobacco products for at least 12 months from quit date.;Short Term: Will quit all tobacco product use, adhering to prevention of relapse plan.    Improve shortness of breath with ADL's  Yes    Intervention  Provide education, individualized exercise plan and daily activity instruction to help decrease symptoms of SOB with activities of daily living.    Expected Outcomes  Short Term: Improve cardiorespiratory fitness to achieve a reduction of symptoms when performing ADLs;Long Term: Be able to perform more ADLs without symptoms or delay the onset of symptoms    Diabetes  Yes    Intervention  Provide education about signs/symptoms and action to take for hypo/hyperglycemia.;Provide education about proper nutrition, including hydration, and aerobic/resistive exercise prescription along with prescribed medications to achieve blood glucose in normal ranges: Fasting glucose 65-99 mg/dL    Expected Outcomes   Short Term: Participant verbalizes understanding of the signs/symptoms and immediate care of hyper/hypoglycemia, proper foot care and importance of medication, aerobic/resistive exercise and nutrition plan for blood glucose control.;Long Term: Attainment of HbA1C < 7%.    Hypertension  Yes    Intervention  Provide education on lifestyle modifcations including regular physical activity/exercise, weight management, moderate sodium restriction and increased consumption of fresh fruit, vegetables, and low fat dairy, alcohol moderation, and smoking cessation.;Monitor prescription use  compliance.    Expected Outcomes  Short Term: Continued assessment and intervention until BP is < 140/27m HG in hypertensive participants. < 130/83mHG in hypertensive participants with diabetes, heart failure or chronic kidney disease.;Long Term: Maintenance of blood pressure at goal levels.    Lipids  Yes    Intervention  Provide education and support for participant on nutrition & aerobic/resistive exercise along with prescribed medications to achieve LDL <702mHDL >23m61m  Expected Outcomes  Short Term: Participant states understanding of desired cholesterol values and is compliant with medications prescribed. Participant is following exercise prescription and nutrition guidelines.;Long Term: Cholesterol controlled with medications as prescribed, with individualized exercise RX and with personalized nutrition plan. Value goals: LDL < 70mg23mL > 40 mg.    Stress  Yes    Intervention  Offer individual and/or small group education and counseling on adjustment to heart disease, stress management and health-related lifestyle change. Teach and support self-help strategies.;Refer participants experiencing significant psychosocial distress to appropriate mental health specialists for further evaluation and treatment. When possible, include family members and significant others in education/counseling sessions.    Expected Outcomes   Short Term: Participant demonstrates changes in health-related behavior, relaxation and other stress management skills, ability to obtain effective social support, and compliance with psychotropic medications if prescribed.;Long Term: Emotional wellbeing is indicated by absence of clinically significant psychosocial distress or social isolation.        Personal Goals Discharge: Goals and Risk Factor Review    Row Name 09/01/18 1514 09/29/18 1655 10/27/18 1152         Core Components/Risk Factors/Patient Goals Review   Personal Goals Review  Weight Management/Obesity;Develop more efficient breathing techniques such as purse lipped breathing and diaphragmatic breathing and practicing self-pacing with activity.;Stress;Tobacco Cessation;Increase knowledge of respiratory medications and ability to use respiratory devices properly.;Hypertension;Diabetes;Lipids;Improve shortness of breath with ADL's  Weight Management/Obesity;Develop more efficient breathing techniques such as purse lipped breathing and diaphragmatic breathing and practicing self-pacing with activity.;Stress;Tobacco Cessation;Increase knowledge of respiratory medications and ability to use respiratory devices properly.;Hypertension;Diabetes;Lipids;Improve shortness of breath with ADL's  Weight Management/Obesity;Develop more efficient breathing techniques such as purse lipped breathing and diaphragmatic breathing and practicing self-pacing with activity.;Stress;Tobacco Cessation;Increase knowledge of respiratory medications and ability to use respiratory devices properly.;Hypertension;Diabetes;Lipids;Improve shortness of breath with ADL's     Review  Pt has completed 1 exercise session.  Unable to adequately assess pt progress toward goals.  Anticipate due to his determination he will show progress during the next 30 day assessment.  Pt has completed 6 exercise session. Pt weight elevated from his begining exercise session. Pt is to meet with  the dietician for meal plan. Pt continues to develop his PLB technique.  will move pt toward more independence. Pt bp remain stable and within normal limits. Will resolve this as a goal.  Pt reports that his lipids are well mangaged with his current statin therapy.  Will resolve this goal.  Pt stress remains an issue.  Pt desires to do much more than he realistically is able to do at this point.  This is stressful for pt.  Will continue to work on pacing his activities.  Pt blood glucose checks are stable will encourage pt to have more protein in his morning meal.   Pt workloads show average of 5 laps around the track, nustep increased to level 3 and arm crank remains level 2.  Anticipate due to his determination he will show progress during the next 30 day assessment.  Pt last exercise session was on 10/10.  pt with a lot of pain and discomfort.  This keeps him from being active.  Unalble to assess progress due to non attendance.  Plan to discharge pt if no response from message left on answering machine.     Expected Outcomes  See Admission Goals/Outcomes  See Admission Goals/Outcomes  See Admission Goals/Outcomes        Exercise Goals and Review: Exercise Goals    Row Name 08/23/18 1444             Exercise Goals   Increase Physical Activity  Yes       Intervention  Provide advice, education, support and counseling about physical activity/exercise needs.;Develop an individualized exercise prescription for aerobic and resistive training based on initial evaluation findings, risk stratification, comorbidities and participant's personal goals.       Expected Outcomes  Short Term: Attend rehab on a regular basis to increase amount of physical activity.;Long Term: Exercising regularly at least 3-5 days a week.;Long Term: Add in home exercise to make exercise part of routine and to increase amount of physical activity.       Increase Strength and Stamina  Yes       Intervention  Provide advice,  education, support and counseling about physical activity/exercise needs.;Develop an individualized exercise prescription for aerobic and resistive training based on initial evaluation findings, risk stratification, comorbidities and participant's personal goals.       Expected Outcomes  Short Term: Increase workloads from initial exercise prescription for resistance, speed, and METs.;Short Term: Perform resistance training exercises routinely during rehab and add in resistance training at home;Long Term: Improve cardiorespiratory fitness, muscular endurance and strength as measured by increased METs and functional capacity (6MWT)       Able to understand and use rate of perceived exertion (RPE) scale  Yes       Intervention  Provide education and explanation on how to use RPE scale       Expected Outcomes  Short Term: Able to use RPE daily in rehab to express subjective intensity level;Long Term:  Able to use RPE to guide intensity level when exercising independently       Able to understand and use Dyspnea scale  Yes       Intervention  Provide education and explanation on how to use Dyspnea scale       Expected Outcomes  Short Term: Able to use Dyspnea scale daily in rehab to express subjective sense of shortness of breath during exertion;Long Term: Able to use Dyspnea scale to guide intensity level when exercising independently       Knowledge and understanding of Target Heart Rate Range (THRR)  Yes       Intervention  Provide education and explanation of THRR including how the numbers were predicted and where they are located for reference       Expected Outcomes  Short Term: Able to state/look up THRR;Long Term: Able to use THRR to govern intensity when exercising independently;Short Term: Able to use daily as guideline for intensity in rehab       Understanding of Exercise Prescription  Yes       Intervention  Provide education, explanation, and written materials on patient's individual exercise  prescription       Expected Outcomes  Short Term: Able to explain program exercise prescription;Long Term: Able to explain home exercise prescription to exercise independently          Exercise Goals  Re-Evaluation: Exercise Goals Re-Evaluation    [place] Name 09/28/18 1544 10/27/18 0714           Exercise Goal Re-Evaluation   Exercise Goals Review  Increase Physical Activity;Increase Strength and Stamina;Able to understand and use rate of perceived exertion (RPE) scale;Able to understand and use Dyspnea scale;Knowledge and understanding of Target Heart Rate Range (THRR);Understanding of Exercise Prescription  Increase Physical Activity;Increase Strength and Stamina;Able to understand and use rate of perceived exertion (RPE) scale;Able to understand and use Dyspnea scale;Knowledge and understanding of Target Heart Rate Range (THRR);Understanding of Exercise Prescription      Comments  Patient is progressing slowly. Barriers are leg pain and stability. Patient's MET level places him in a low to moderate level (2.5). Is able to walk 4 laps (200 ft) in 15 minutes. Patient went to the ED today with extreme leg pain. Will cont to monitor and progress patient as able.  Pt last came to a session on 10/07/2018, in which the pt left upon arrival due to pain. Pt went to the ED due to leg pain that he reported was the worst that he had ever felt. If pt returns to the program we will monitor and progress as able.       Expected Outcomes  Through exercise at rehab and at home, patient will increase functional ability, have a decrease in sob with ADL's, and establish an exercise regimine at home.  Through exercise at rehab and at home, patient will increase functional ability, have a decrease in sob with ADL's, and establish an exercise regimine at home.         Nutrition & Weight - Outcomes:    Nutrition: Nutrition Therapy & Goals - 11/12/18 1622      Nutrition Therapy   Diet  diabetic      Intervention  Plan   Intervention  Prescribe, educate and counsel regarding individualized specific dietary modifications aiming towards targeted core components such as weight, hypertension, lipid management, diabetes, heart failure and other comorbidities.    Expected Outcomes  Short Term Goal: Understand basic principles of dietary content, such as calories, fat, sodium, cholesterol and nutrients.;Long Term Goal: Adherence to prescribed nutrition plan.       Nutrition Discharge: Nutrition Assessments - 11/12/18 1625      Rate Your Plate Scores   Pre Score  54    Post Score  --   did not return survey      Education Questionnaire Score: Knowledge Questionnaire Score - 09/01/18 1523      Knowledge Questionnaire Score   Pre Score  12/18       Goals reviewed with patient. Cherre Huger, BSN Cardiac and Training and development officer

## 2018-12-12 NOTE — Addendum Note (Signed)
Encounter addended by: Rowe Pavy, RN on: 12/12/2018 3:06 PM  Actions taken: Episode resolved, Flowsheet data copied forward, Clinical Note Signed, Visit Navigator Flowsheet section accepted

## 2018-12-28 ENCOUNTER — Other Ambulatory Visit: Payer: Self-pay | Admitting: Urology

## 2018-12-31 ENCOUNTER — Other Ambulatory Visit: Payer: Self-pay

## 2018-12-31 ENCOUNTER — Encounter (HOSPITAL_BASED_OUTPATIENT_CLINIC_OR_DEPARTMENT_OTHER): Payer: Self-pay | Admitting: *Deleted

## 2018-12-31 NOTE — Progress Notes (Addendum)
Spoke w/ pt and pt wife via phone for pre-op interview.   Medication list completed with pt.  Npo after mn.   Arrive at 0800.  Needs istat.  Current ekg in chart and epic.  Will take effexor, keppra, gabapentin, protonix, diltiazem am dos w/ sips of water.  Also, to do stiolto respimat  inhaler and proventil nebulizer morning of surgery.  Pt asked to bring rescue inhaler and cpap.  CT result dated 11-23-2018 in chart and epic.    Requested pulmonologist lov note, pcp note, cardiologist note, CXR done after christmas, and last pacer check result via fax from [facility].  Chart to be reviewed by anesthesia.
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
## 2019-01-04 NOTE — Anesthesia Preprocedure Evaluation (Addendum)
Anesthesia Evaluation  Patient identified by MRN, date of birth, ID band Patient awake    Reviewed: Allergy & Precautions, NPO status , Patient's Chart, lab work & pertinent test results  Airway Mallampati: II  TM Distance: >3 FB Neck ROM: Full    Dental no notable dental hx.    Pulmonary pneumonia, COPD,  COPD inhaler, Current Smoker,    Pulmonary exam normal breath sounds clear to auscultation + decreased breath sounds      Cardiovascular hypertension, + Peripheral Vascular Disease  Normal cardiovascular exam+ pacemaker  Rhythm:Regular Rate:Normal     Neuro/Psych CVA, Residual Symptoms negative psych ROS   GI/Hepatic negative GI ROS, (+)     substance abuse  cocaine use,   Endo/Other  diabetes, Insulin Dependent  Renal/GU negative Renal ROS  negative genitourinary   Musculoskeletal negative musculoskeletal ROS (+)   Abdominal   Peds negative pediatric ROS (+)  Hematology negative hematology ROS (+)   Anesthesia Other Findings   Reproductive/Obstetrics negative OB ROS                           Anesthesia Physical Anesthesia Plan  ASA: IV  Anesthesia Plan: General   Post-op Pain Management:    Induction: Intravenous  PONV Risk Score and Plan: 2 and Ondansetron, Dexamethasone and Treatment may vary due to age or medical condition  Airway Management Planned: LMA  Additional Equipment:   Intra-op Plan:   Post-operative Plan: Extubation in OR  Informed Consent: I have reviewed the patients History and Physical, chart, labs and discussed the procedure including the risks, benefits and alternatives for the proposed anesthesia with the patient or authorized representative who has indicated his/her understanding and acceptance.     Dental advisory given  Plan Discussed with: CRNA and Surgeon  Anesthesia Plan Comments: (See PST note 01/03/19, Konrad Felix, PA-C.)       Anesthesia Quick Evaluation

## 2019-01-05 ENCOUNTER — Ambulatory Visit (HOSPITAL_BASED_OUTPATIENT_CLINIC_OR_DEPARTMENT_OTHER): Payer: No Typology Code available for payment source | Admitting: Physician Assistant

## 2019-01-05 ENCOUNTER — Ambulatory Visit (HOSPITAL_BASED_OUTPATIENT_CLINIC_OR_DEPARTMENT_OTHER)
Admission: RE | Admit: 2019-01-05 | Discharge: 2019-01-05 | Disposition: A | Discharge status: Home / Self Care | Payer: No Typology Code available for payment source | Attending: Urology | Admitting: Urology

## 2019-01-05 ENCOUNTER — Encounter (HOSPITAL_BASED_OUTPATIENT_CLINIC_OR_DEPARTMENT_OTHER): Payer: Self-pay | Admitting: *Deleted

## 2019-01-05 ENCOUNTER — Encounter (HOSPITAL_BASED_OUTPATIENT_CLINIC_OR_DEPARTMENT_OTHER)
Admission: RE | Disposition: A | Discharge status: Home / Self Care | Payer: Self-pay | Source: Home / Self Care | Attending: Urology

## 2019-01-05 ENCOUNTER — Other Ambulatory Visit: Payer: Self-pay

## 2019-01-05 DIAGNOSIS — Z880 Allergy status to penicillin: Secondary | ICD-10-CM | POA: Diagnosis not present

## 2019-01-05 DIAGNOSIS — R251 Tremor, unspecified: Secondary | ICD-10-CM | POA: Insufficient documentation

## 2019-01-05 DIAGNOSIS — I69954 Hemiplegia and hemiparesis following unspecified cerebrovascular disease affecting left non-dominant side: Secondary | ICD-10-CM | POA: Insufficient documentation

## 2019-01-05 DIAGNOSIS — F17210 Nicotine dependence, cigarettes, uncomplicated: Secondary | ICD-10-CM | POA: Diagnosis not present

## 2019-01-05 DIAGNOSIS — R339 Retention of urine, unspecified: Secondary | ICD-10-CM | POA: Diagnosis not present

## 2019-01-05 DIAGNOSIS — Z79899 Other long term (current) drug therapy: Secondary | ICD-10-CM | POA: Insufficient documentation

## 2019-01-05 DIAGNOSIS — R310 Gross hematuria: Secondary | ICD-10-CM | POA: Insufficient documentation

## 2019-01-05 DIAGNOSIS — Z881 Allergy status to other antibiotic agents status: Secondary | ICD-10-CM | POA: Diagnosis not present

## 2019-01-05 DIAGNOSIS — I252 Old myocardial infarction: Secondary | ICD-10-CM | POA: Insufficient documentation

## 2019-01-05 DIAGNOSIS — N401 Enlarged prostate with lower urinary tract symptoms: Secondary | ICD-10-CM | POA: Diagnosis not present

## 2019-01-05 DIAGNOSIS — Z950 Presence of cardiac pacemaker: Secondary | ICD-10-CM | POA: Diagnosis not present

## 2019-01-05 DIAGNOSIS — I495 Sick sinus syndrome: Secondary | ICD-10-CM | POA: Insufficient documentation

## 2019-01-05 DIAGNOSIS — Z7982 Long term (current) use of aspirin: Secondary | ICD-10-CM | POA: Diagnosis not present

## 2019-01-05 DIAGNOSIS — E1151 Type 2 diabetes mellitus with diabetic peripheral angiopathy without gangrene: Secondary | ICD-10-CM | POA: Insufficient documentation

## 2019-01-05 DIAGNOSIS — E785 Hyperlipidemia, unspecified: Secondary | ICD-10-CM | POA: Diagnosis not present

## 2019-01-05 DIAGNOSIS — J439 Emphysema, unspecified: Secondary | ICD-10-CM | POA: Insufficient documentation

## 2019-01-05 DIAGNOSIS — I10 Essential (primary) hypertension: Secondary | ICD-10-CM | POA: Diagnosis not present

## 2019-01-05 DIAGNOSIS — I2510 Atherosclerotic heart disease of native coronary artery without angina pectoris: Secondary | ICD-10-CM | POA: Insufficient documentation

## 2019-01-05 DIAGNOSIS — Z794 Long term (current) use of insulin: Secondary | ICD-10-CM | POA: Diagnosis not present

## 2019-01-05 DIAGNOSIS — I714 Abdominal aortic aneurysm, without rupture: Secondary | ICD-10-CM | POA: Insufficient documentation

## 2019-01-05 DIAGNOSIS — R1900 Intra-abdominal and pelvic swelling, mass and lump, unspecified site: Secondary | ICD-10-CM | POA: Diagnosis present

## 2019-01-05 DIAGNOSIS — R81 Glycosuria: Secondary | ICD-10-CM | POA: Insufficient documentation

## 2019-01-05 DIAGNOSIS — F419 Anxiety disorder, unspecified: Secondary | ICD-10-CM | POA: Diagnosis not present

## 2019-01-05 DIAGNOSIS — Z791 Long term (current) use of non-steroidal anti-inflammatories (NSAID): Secondary | ICD-10-CM | POA: Diagnosis not present

## 2019-01-05 DIAGNOSIS — C652 Malignant neoplasm of left renal pelvis: Secondary | ICD-10-CM | POA: Insufficient documentation

## 2019-01-05 HISTORY — DX: Personal history of other diseases of the musculoskeletal system and connective tissue: Z87.39

## 2019-01-05 HISTORY — DX: Other specified disorders of kidney and ureter: N28.89

## 2019-01-05 HISTORY — DX: Emphysema, unspecified: J43.9

## 2019-01-05 HISTORY — DX: Pneumonia, unspecified organism: J18.9

## 2019-01-05 HISTORY — DX: Type 2 diabetes mellitus without complications: E11.9

## 2019-01-05 HISTORY — DX: Retention of urine, unspecified: R33.9

## 2019-01-05 HISTORY — DX: Infrarenal abdominal aortic aneurysm, without rupture: I71.43

## 2019-01-05 HISTORY — DX: Personal history of urinary (tract) infections: Z87.440

## 2019-01-05 HISTORY — DX: Unspecified osteoarthritis, unspecified site: M19.90

## 2019-01-05 HISTORY — DX: Central retinal vein occlusion, unspecified eye, stable: H34.8192

## 2019-01-05 HISTORY — DX: Presence of spectacles and contact lenses: Z97.3

## 2019-01-05 HISTORY — PX: CYSTOSCOPY/RETROGRADE/URETEROSCOPY: SHX5316

## 2019-01-05 HISTORY — DX: Other psychoactive substance abuse, uncomplicated: F19.10

## 2019-01-05 HISTORY — DX: Presence of cardiac pacemaker: Z95.0

## 2019-01-05 HISTORY — DX: Personal history of other specified conditions: Z87.898

## 2019-01-05 HISTORY — DX: Atherosclerotic heart disease of native coronary artery without angina pectoris: I25.10

## 2019-01-05 HISTORY — DX: Hematuria, unspecified: R31.9

## 2019-01-05 HISTORY — DX: Abdominal aortic aneurysm, without rupture: I71.4

## 2019-01-05 HISTORY — DX: Unspecified sequelae of cerebral infarction: I69.30

## 2019-01-05 HISTORY — DX: Presence of external hearing-aid: Z97.4

## 2019-01-05 HISTORY — DX: Diverticulosis of large intestine without perforation or abscess without bleeding: K57.30

## 2019-01-05 HISTORY — DX: Benign prostatic hyperplasia with lower urinary tract symptoms: N40.1

## 2019-01-05 HISTORY — DX: Personal history of other diseases of the digestive system: Z87.19

## 2019-01-05 HISTORY — DX: Sick sinus syndrome: I49.5

## 2019-01-05 HISTORY — DX: Personal history of tuberculosis: Z86.11

## 2019-01-05 LAB — POCT I-STAT 4, (NA,K, GLUC, HGB,HCT)
Glucose, Bld: 133 mg/dL — ABNORMAL HIGH (ref 70–99)
HCT: 42 % (ref 39.0–52.0)
Hemoglobin: 14.3 g/dL (ref 13.0–17.0)
Potassium: 3.8 mmol/L (ref 3.5–5.1)
Sodium: 142 mmol/L (ref 135–145)

## 2019-01-05 LAB — RAPID URINE DRUG SCREEN, HOSP PERFORMED
Amphetamines: NOT DETECTED
BARBITURATES: NOT DETECTED
Benzodiazepines: NOT DETECTED
Cocaine: NOT DETECTED
Opiates: NOT DETECTED
Tetrahydrocannabinol: NOT DETECTED

## 2019-01-05 LAB — POCT I-STAT CREATININE: Creatinine, Ser: 0.8 mg/dL (ref 0.61–1.24)

## 2019-01-05 LAB — GLUCOSE, CAPILLARY: Glucose-Capillary: 161 mg/dL — ABNORMAL HIGH (ref 70–99)

## 2019-01-05 SURGERY — CYSTOSCOPY/RETROGRADE/URETEROSCOPY
Anesthesia: General | Site: Urethra | Laterality: Left

## 2019-01-05 MED ORDER — LIDOCAINE 2% (20 MG/ML) 5 ML SYRINGE
INTRAMUSCULAR | Status: DC | PRN
  Administered 2019-01-05: 60 mg via INTRAVENOUS

## 2019-01-05 MED ORDER — DEXAMETHASONE SODIUM PHOSPHATE 10 MG/ML IJ SOLN
INTRAMUSCULAR | Status: DC | PRN
  Administered 2019-01-05: 5 mg via INTRAVENOUS

## 2019-01-05 MED ORDER — GENTAMICIN SULFATE 40 MG/ML IJ SOLN
5.0000 mg/kg | INTRAVENOUS | Status: DC
  Administered 2019-01-05: 510 mg via INTRAVENOUS
  Filled 2019-01-05: qty 12.75

## 2019-01-05 MED ORDER — FENTANYL CITRATE (PF) 100 MCG/2ML IJ SOLN
25.0000 ug | INTRAMUSCULAR | Status: DC | PRN
  Filled 2019-01-05: qty 1

## 2019-01-05 MED ORDER — PHENYLEPHRINE HCL 10 MG/ML IJ SOLN
INTRAMUSCULAR | Status: DC | PRN
  Administered 2019-01-05 (×2): 80 ug via INTRAVENOUS
  Administered 2019-01-05: 40 ug via INTRAVENOUS
  Administered 2019-01-05: 80 ug via INTRAVENOUS
  Administered 2019-01-05: 120 ug via INTRAVENOUS

## 2019-01-05 MED ORDER — FENTANYL CITRATE (PF) 100 MCG/2ML IJ SOLN
INTRAMUSCULAR | Status: AC
  Filled 2019-01-05: qty 2

## 2019-01-05 MED ORDER — MIDAZOLAM HCL 2 MG/2ML IJ SOLN
INTRAMUSCULAR | Status: AC
  Filled 2019-01-05: qty 2

## 2019-01-05 MED ORDER — IOHEXOL 300 MG/ML  SOLN
INTRAMUSCULAR | Status: DC | PRN
  Administered 2019-01-05: 18 mL via URETHRAL

## 2019-01-05 MED ORDER — ONDANSETRON HCL 4 MG/2ML IJ SOLN
INTRAMUSCULAR | Status: DC | PRN
  Administered 2019-01-05: 4 mg via INTRAVENOUS

## 2019-01-05 MED ORDER — PROMETHAZINE HCL 25 MG/ML IJ SOLN
6.2500 mg | INTRAMUSCULAR | Status: DC | PRN
  Filled 2019-01-05: qty 1

## 2019-01-05 MED ORDER — PROPOFOL 10 MG/ML IV BOLUS
INTRAVENOUS | Status: AC
  Filled 2019-01-05: qty 20

## 2019-01-05 MED ORDER — KETOROLAC TROMETHAMINE 30 MG/ML IJ SOLN
30.0000 mg | Freq: Once | INTRAMUSCULAR | Status: DC | PRN
  Filled 2019-01-05: qty 1

## 2019-01-05 MED ORDER — ONDANSETRON HCL 4 MG/2ML IJ SOLN
INTRAMUSCULAR | Status: AC
  Filled 2019-01-05: qty 2

## 2019-01-05 MED ORDER — KETOROLAC TROMETHAMINE 30 MG/ML IJ SOLN
INTRAMUSCULAR | Status: DC | PRN
  Administered 2019-01-05: 30 mg via INTRAVENOUS

## 2019-01-05 MED ORDER — WHITE PETROLATUM EX OINT
TOPICAL_OINTMENT | CUTANEOUS | Status: AC
  Filled 2019-01-05: qty 5

## 2019-01-05 MED ORDER — FENTANYL CITRATE (PF) 100 MCG/2ML IJ SOLN
INTRAMUSCULAR | Status: DC | PRN
  Administered 2019-01-05 (×2): 25 ug via INTRAVENOUS

## 2019-01-05 MED ORDER — KETOROLAC TROMETHAMINE 30 MG/ML IJ SOLN
INTRAMUSCULAR | Status: AC
  Filled 2019-01-05: qty 1

## 2019-01-05 MED ORDER — PROPOFOL 10 MG/ML IV BOLUS
INTRAVENOUS | Status: DC | PRN
  Administered 2019-01-05: 200 mg via INTRAVENOUS

## 2019-01-05 MED ORDER — GENTAMICIN SULFATE 40 MG/ML IJ SOLN
5.0000 mg/kg | INTRAVENOUS | Status: DC
  Filled 2019-01-05: qty 11

## 2019-01-05 MED ORDER — OXYCODONE-ACETAMINOPHEN 5-325 MG PO TABS: 1.0000 | ORAL_TABLET | ORAL | 0 refills | Status: DC | PRN

## 2019-01-05 MED ORDER — PHENYLEPHRINE 40 MCG/ML (10ML) SYRINGE FOR IV PUSH (FOR BLOOD PRESSURE SUPPORT)
PREFILLED_SYRINGE | INTRAVENOUS | Status: AC
  Filled 2019-01-05: qty 10

## 2019-01-05 MED ORDER — LACTATED RINGERS IV SOLN
INTRAVENOUS | Status: DC
  Administered 2019-01-05: 09:00:00 via INTRAVENOUS
  Filled 2019-01-05: qty 1000

## 2019-01-05 SURGICAL SUPPLY — 32 items
BAG DRAIN URO-CYSTO SKYTR STRL (DRAIN) ×4 IMPLANT
BASKET LASER NITINOL 1.9FR (BASKET) ×4 IMPLANT
CATH INTERMIT  6FR 70CM (CATHETERS) IMPLANT
CLOTH BEACON ORANGE TIMEOUT ST (SAFETY) ×4 IMPLANT
DRSG TELFA 3X8 NADH (GAUZE/BANDAGES/DRESSINGS) ×4 IMPLANT
FIBER LASER FLEXIVA 365 (UROLOGICAL SUPPLIES) IMPLANT
FIBER LASER TRAC TIP (UROLOGICAL SUPPLIES) IMPLANT
FORCEPS BIOP 2.4F 115CM BACKLD (INSTRUMENTS) ×4 IMPLANT
GLOVE BIO SURGEON STRL SZ7 (GLOVE) ×4 IMPLANT
GLOVE BIO SURGEON STRL SZ7.5 (GLOVE) ×4 IMPLANT
GLOVE BIOGEL PI IND STRL 7.0 (GLOVE) ×2 IMPLANT
GLOVE BIOGEL PI INDICATOR 7.0 (GLOVE) ×2
GLOVE ECLIPSE 7.0 STRL STRAW (GLOVE) ×4 IMPLANT
GOWN STRL REUS W/TWL LRG LVL3 (GOWN DISPOSABLE) ×8 IMPLANT
GOWN STRL REUS W/TWL XL LVL3 (GOWN DISPOSABLE) ×4 IMPLANT
GUIDEWIRE ANG ZIPWIRE 038X150 (WIRE) ×4 IMPLANT
GUIDEWIRE STR DUAL SENSOR (WIRE) ×4 IMPLANT
INFUSOR MANOMETER BAG 3000ML (MISCELLANEOUS) IMPLANT
IV NS 1000ML (IV SOLUTION) ×2
IV NS 1000ML BAXH (IV SOLUTION) ×2 IMPLANT
IV NS IRRIG 3000ML ARTHROMATIC (IV SOLUTION) ×4 IMPLANT
KIT TURNOVER CYSTO (KITS) ×4 IMPLANT
MANIFOLD NEPTUNE II (INSTRUMENTS) ×4 IMPLANT
NS IRRIG 500ML POUR BTL (IV SOLUTION) ×4 IMPLANT
PACK CYSTO (CUSTOM PROCEDURE TRAY) ×4 IMPLANT
SHEATH URETERAL 12FRX35CM (MISCELLANEOUS) ×4 IMPLANT
STENT POLARIS 5FRX26 (STENTS) ×4 IMPLANT
SYR 10ML LL (SYRINGE) ×4 IMPLANT
TUBE CONNECTING 12'X1/4 (SUCTIONS) ×1
TUBE CONNECTING 12X1/4 (SUCTIONS) ×3 IMPLANT
TUBE FEEDING 8FR 16IN STR KANG (MISCELLANEOUS) ×4 IMPLANT
TUBING UROLOGY SET (TUBING) ×4 IMPLANT

## 2019-01-05 NOTE — Transfer of Care (Signed)
Immediate Anesthesia Transfer of Care Note  Patient: [name]  Procedure(s) Performed: CYSTOSCOPY/LEFT RETROGRADE/LEFT URETEROSCOPY/LEFT RENAL BIOPSY OF TUMOR AND STENT (Left Urethra) HOLMIUM LASER APPLICATION (Left Urethra)  Patient Location: PACU  Anesthesia Type:General  Level of Consciousness: awake, alert , oriented and patient cooperative  Airway & Oxygen Therapy: Patient Spontanous Breathing and Patient connected to face mask  Post-op Assessment: Report given to RN and Post -op Vital signs reviewed and stable  Post vital signs: Reviewed and stable  Last Vitals:  Vitals Value Taken Time  BP    Temp    Pulse    Resp    SpO2      Last Pain:  Vitals:   01/05/19 0834  TempSrc:   PainSc: 0-No pain      Patients Stated Pain Goal: 8 (96/29/52 8413)  Complications: No apparent anesthesia complications

## 2019-01-05 NOTE — Anesthesia Postprocedure Evaluation (Signed)
Anesthesia Post Note  Patient: TREDARIUS COBERN  Procedure(s) Performed: CYSTOSCOPY/LEFT RETROGRADE/LEFT URETEROSCOPY/LEFT RENAL BIOPSY OF TUMOR AND STENT (Left Urethra)     Patient location during evaluation: PACU Anesthesia Type: General Level of consciousness: awake and alert Pain management: pain level controlled Vital Signs Assessment: post-procedure vital signs reviewed and stable Respiratory status: spontaneous breathing, nonlabored ventilation, respiratory function stable and patient connected to nasal cannula oxygen Cardiovascular status: blood pressure returned to baseline and stable Postop Assessment: no apparent nausea or vomiting Anesthetic complications: no    Last Vitals:  Vitals:   01/05/19 1145 01/05/19 1200  BP: 116/66   Pulse: 65   Resp: 14   Temp:    SpO2: 92% 94%    Last Pain:  Vitals:   01/05/19 1215  TempSrc:   PainSc: 0-No pain                 Syre Knerr S

## 2019-01-05 NOTE — Brief Op Note (Signed)
01/05/2019  11:11 AM  PATIENT:  Wesley [name]  63 y.o. male  PRE-OPERATIVE DIAGNOSIS:  HEMATURIA, LEFT RENAL PELVIS MASS  POST-OPERATIVE DIAGNOSIS:  HEMATURIA, LEFT RENAL PELVIS TUMOR  PROCEDURE:  Procedure(s) with comments: CYSTOSCOPY/LEFT RETROGRADE/LEFT URETEROSCOPY/LEFT RENAL BIOPSY OF TUMOR AND STENT (Left) - 75 MINS HOLMIUM LASER APPLICATION (Left)  SURGEON:  Surgeon(s) and Role:    Alexis Frock, MD - Primary  PHYSICIAN ASSISTANT:   ASSISTANTS: none   ANESTHESIA:   general  EBL:  minimal   BLOOD ADMINISTERED:none  DRAINS: none   LOCAL MEDICATIONS USED:  NONE  SPECIMEN:  Source of Specimen:  left renal pelvis cancer  DISPOSITION OF SPECIMEN:  PATHOLOGY  COUNTS:  YES  TOURNIQUET:  * No tourniquets in log *  DICTATION: .Other Dictation: Dictation Number  [id number]  PLAN OF CARE: Discharge to home after PACU  PATIENT DISPOSITION:  PACU - hemodynamically stable.   Delay start of Pharmacological VTE agent (>24hrs) due to surgical blood loss or risk of bleeding: yes
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
## 2019-01-05 NOTE — Anesthesia Procedure Notes (Signed)
Procedure Name: LMA Insertion Date/Time: 01/05/2019 10:27 AM Performed by: Wanita Chamberlain, CRNA Pre-anesthesia Checklist: Patient identified, Emergency Drugs available, Suction available and Patient being monitored Patient Re-evaluated:Patient Re-evaluated prior to induction Oxygen Delivery Method: Circle system utilized Preoxygenation: Pre-oxygenation with 100% oxygen Induction Type: IV induction Ventilation: Mask ventilation without difficulty LMA: LMA inserted LMA Size: 5.0 Number of attempts: 1 Placement Confirmation: breath sounds checked- equal and bilateral,  CO2 detector and positive ETCO2 Tube secured with: Tape Dental Injury: Teeth and Oropharynx as per pre-operative assessment

## 2019-01-05 NOTE — Op Note (Signed)
NAME: Wesley [name], Wesley [name] MEDICAL RECORD [id number] ACCOUNT [id number] DATE OF BIRTH:11-16-1956 FACILITY: [facility] LOCATION: [facility] PHYSICIAN:Elan Brainerd, MD  OPERATIVE REPORT  DATE OF PROCEDURE:  01/05/2019  PREOPERATIVE DIAGNOSIS:  Hematuria, questionable left renal pelvis cancer.  POSTOPERATIVE DIAGNOSIS:  Hematuria with left renal pelvis cancer.  PROCEDURE: 1.  Cystoscopy with left retrograde pyelogram, interpretation. 2.  Left ureteroscopy with biopsy of a lesion. 3.  Left ureteral stent placement, 5 x 26 Polaris, no tether.  ESTIMATED BLOOD LOSS:  Nil.  COMPLICATIONS:  None.  SPECIMENS:  Left renal pelvis tumor for pathology.  FINDINGS: 1.  Extreme upper pole papillary tumor.  Total volume approximately 2 cm.  Photo documentation performed. 2.  Otherwise unremarkable left retrograde pyelogram. 3.  Trilobar prostatic hypertrophy.  INDICATIONS:  The patient is a 63 year old gentleman who is quite comorbid.  He was referred from the [place] for gross hematuria with a left renal pelvis filling defect concerning for possible urothelial neoplasm.  He was referred for further diagnostic  management of this.  Imaging was reviewed, labs and history, which we concurred.  Concerned for left renal pelvis cancer, clinically localized.  Options were discussed including recommended path of the ureteroscopy, diagnostic intent for biopsy, and  possible ablation if lesion was small, and he wished to proceed.  Informed consent was obtained and placed in the medical record.  DESCRIPTION OF PROCEDURE:  The patient being identified, procedure being left ureteroscopy with biopsy of the renal pelvis mass, possible ablation was confirmed.  Procedure time-out was performed.  Antibiotics administered.  General anesthesia  introduced.  The patient was placed into a low lithotomy position.  Sterile field was created.  I prepped and draped the base of the penis, perineum and proximal thighs using iodine.   Cystourethroscopy was performed using a 22-French rigid cystoscope  with offset lens.  Inspection of anterior and posterior urethra only revealed some trilobar prostatic hypertrophy.  Inspection of her bladder revealed no diverticula, calcifications or lesions.  There was mild trabeculation noted.  The left ureteral  orifice was cannulated with a 6-French renal catheter, and left retrograde pyelogram was obtained.  Left ventriculogram with a single left ureter with single-system left kidney, and there was a questionable filling defect in the extreme upper pole.  A 0.03 ZIPwire was advanced to the lower pole and set aside as a safety wire.  An 8-French feeding tube  was placed in the urinary bladder, pressure released, and semi-rigid ureteroscopy was performed of the distal 2/3 of left ureter alongside a separate sensor working wire.  No mucosal abnormalities were found.  The semirigid scope was exchanged for a  12/14 medium length ureteral access sheath to the level of the proximal ureter.  Using continuous fluoroscopic guidance, a flexible digital ureteroscopy was performed of the proximal left ureter and systematic inspection of the left kidney, including all  calices x3.  There was found to be a papillary tumor involving the extreme upper pole.  This was not obstructing, total volume approximately 2 sq cm.  This did appear amenable to ureteroscopic biopsy.  First, using a snare technique with an escape  basket, representative areas of the tumor were snared and carefully navigated out, the sheath set aside for permanent pathology.  Next, a BIGopsy ureteral biopsy apparatus was backloaded and additional samples taken with this technique which resulted in  excellent sampling of the papillary tumor.  Given the volume of tumor, unknown of high-grade versus low grade, we felt that biopsy only today would be  the most prudent as further therapy may depend on this determination.  The access sheath was removed   under continuous vision.  No mucosal abnormalities were found.  Finally, new 5 x 26 Polaris-type stent was placed with remaining safety wire using fluoroscopic guidance.  Good proximal and distal plane were noted.  Bladder was empty per cystoscope and  procedure terminated.  The patient tolerated the procedure well.  No immediate perioperative complications.  The patient was taken to postanesthesia care in stable condition.  LN/NUANCE  D:01/05/2019 T:01/05/2019 JOB:[id number]

## 2019-01-05 NOTE — Discharge Instructions (Signed)
1 - You may have urinary urgency (bladder spasms) and bloody urine on / off with stent in place. This is normal.  2 - Call MD or go to ER for fever >102, severe pain / nausea / vomiting not relieved by medications, or acute change in medical status'   CYSTOSCOPY HOME CARE INSTRUCTIONS  Activity: Rest for the remainder of the day.  Do not drive or operate equipment today.  You may resume normal activities in one to two days as instructed by your physician.   Meals: Drink plenty of liquids and eat light foods such as gelatin or soup this evening.  You may return to a normal meal plan tomorrow.  Return to Work: You may return to work in one to two days or as instructed by your physician.  Special Instructions / Symptoms: Call your physician if any of these symptoms occur:   -persistent or heavy bleeding  -large blood clots that are difficult to pass  -urine stream diminishes or stops completely  -fever equal to or higher than 101 degrees Farenheit.  -cloudy urine with a strong, foul odor  -severe pain  Females should always wipe from front to back after elimination.  You may feel some burning pain when you urinate.  This should disappear with time.  Applying moist heat to the lower abdomen or a hot tub bath may help relieve the pain. \     Post Anesthesia Home Care Instructions  Activity: Get plenty of rest for the remainder of the day. A responsible individual must stay with you for 24 hours following the procedure.  For the next 24 hours, DO NOT: -Drive a car -Paediatric nurse -Drink alcoholic beverages -Take any medication unless instructed by your physician -Make any legal decisions or sign important papers.  Meals: Start with liquid foods such as gelatin or soup. Progress to regular foods as tolerated. Avoid greasy, spicy, heavy foods. If nausea and/or vomiting occur, drink only clear liquids until the nausea and/or vomiting subsides. Call your physician if vomiting  continues.  Special Instructions/Symptoms: Your throat may feel dry or sore from the anesthesia or the breathing tube placed in your throat during surgery. If this causes discomfort, gargle with warm salt water. The discomfort should disappear within 24 hours.  If you had a scopolamine patch placed behind your ear for the management of post- operative nausea and/or vomiting:  1. The medication in the patch is effective for 72 hours, after which it should be removed.  Wrap patch in a tissue and discard in the trash. Wash hands thoroughly with soap and water. 2. You may remove the patch earlier than 72 hours if you experience unpleasant side effects which may include dry mouth, dizziness or visual disturbances. 3. Avoid touching the patch. Wash your hands with soap and water after contact with the patch.

## 2019-01-06 ENCOUNTER — Encounter (HOSPITAL_BASED_OUTPATIENT_CLINIC_OR_DEPARTMENT_OTHER): Payer: Self-pay | Admitting: Urology

## 2019-01-15 DIAGNOSIS — C652 Malignant neoplasm of left renal pelvis: Secondary | ICD-10-CM

## 2019-01-15 HISTORY — DX: Malignant neoplasm of left renal pelvis: C65.2

## 2019-01-19 ENCOUNTER — Other Ambulatory Visit: Payer: Self-pay | Admitting: Urology

## 2019-01-28 ENCOUNTER — Encounter (HOSPITAL_BASED_OUTPATIENT_CLINIC_OR_DEPARTMENT_OTHER): Payer: Self-pay | Admitting: *Deleted

## 2019-01-31 NOTE — Progress Notes (Signed)
Reviewing pt chart for pre-op telephone interview.  Noted pt had surgery at [facility] on 01-05-2019 and anesthesia made him a ASA IV due to multiple lung and cardiac co-morbidities with hx cocaine use.  Due to anesthesia guidelines for ambulatory surgery center ASA IV are not a candidate for surgery center and needs to be done at a main OR.  Reviewed with anesthesia today , Claudina Lick PA,  Stated pt needs to be done at a main OR setting.  Called and lvm for dr Tresa Moore or scheduler, selita, to let dr Tresa Moore know and if any questions to call anesthesia.

## 2019-02-01 ENCOUNTER — Other Ambulatory Visit: Payer: Self-pay

## 2019-02-01 ENCOUNTER — Encounter (HOSPITAL_COMMUNITY): Payer: Self-pay | Admitting: *Deleted

## 2019-02-01 NOTE — Progress Notes (Signed)
Dr Joan Mayans - at [facility] in [facility] checks and cardiology visits are done at same time. Patient can also do remote transmissions per wife. Lab work done at [facility] week of 01/28/2019 per wife.   Last cardiology visit per wife was 11/2018. A1C was done with labs week of 01/28/2019 per wife.

## 2019-02-02 ENCOUNTER — Other Ambulatory Visit: Payer: Self-pay

## 2019-02-02 ENCOUNTER — Encounter (HOSPITAL_COMMUNITY): Payer: Self-pay | Admitting: *Deleted

## 2019-02-02 MED ORDER — GENTAMICIN SULFATE 40 MG/ML IJ SOLN
5.0000 mg/kg | INTRAVENOUS | Status: AC
  Administered 2019-02-03: 450 mg via INTRAVENOUS
  Filled 2019-02-02: qty 11.25

## 2019-02-03 ENCOUNTER — Encounter (HOSPITAL_COMMUNITY)
Admission: RE | Disposition: A | Discharge status: Home / Self Care | Payer: Self-pay | Source: Home / Self Care | Attending: Urology

## 2019-02-03 ENCOUNTER — Ambulatory Visit (HOSPITAL_COMMUNITY): Payer: No Typology Code available for payment source

## 2019-02-03 ENCOUNTER — Ambulatory Visit (HOSPITAL_COMMUNITY)
Admission: RE | Admit: 2019-02-03 | Discharge: 2019-02-03 | Disposition: A | Discharge status: Home / Self Care | Payer: No Typology Code available for payment source | Attending: Urology | Admitting: Urology

## 2019-02-03 ENCOUNTER — Encounter (HOSPITAL_COMMUNITY): Payer: Self-pay

## 2019-02-03 DIAGNOSIS — E119 Type 2 diabetes mellitus without complications: Secondary | ICD-10-CM | POA: Insufficient documentation

## 2019-02-03 DIAGNOSIS — Z950 Presence of cardiac pacemaker: Secondary | ICD-10-CM | POA: Diagnosis not present

## 2019-02-03 DIAGNOSIS — Z888 Allergy status to other drugs, medicaments and biological substances status: Secondary | ICD-10-CM | POA: Insufficient documentation

## 2019-02-03 DIAGNOSIS — Z87891 Personal history of nicotine dependence: Secondary | ICD-10-CM | POA: Insufficient documentation

## 2019-02-03 DIAGNOSIS — C652 Malignant neoplasm of left renal pelvis: Secondary | ICD-10-CM | POA: Diagnosis not present

## 2019-02-03 DIAGNOSIS — M1990 Unspecified osteoarthritis, unspecified site: Secondary | ICD-10-CM | POA: Insufficient documentation

## 2019-02-03 DIAGNOSIS — N401 Enlarged prostate with lower urinary tract symptoms: Secondary | ICD-10-CM | POA: Insufficient documentation

## 2019-02-03 DIAGNOSIS — I10 Essential (primary) hypertension: Secondary | ICD-10-CM | POA: Diagnosis present

## 2019-02-03 DIAGNOSIS — E785 Hyperlipidemia, unspecified: Secondary | ICD-10-CM | POA: Diagnosis present

## 2019-02-03 DIAGNOSIS — G4730 Sleep apnea, unspecified: Secondary | ICD-10-CM | POA: Diagnosis not present

## 2019-02-03 DIAGNOSIS — Z820 Family history of epilepsy and other diseases of the nervous system: Secondary | ICD-10-CM | POA: Diagnosis not present

## 2019-02-03 DIAGNOSIS — Z87440 Personal history of urinary (tract) infections: Secondary | ICD-10-CM | POA: Insufficient documentation

## 2019-02-03 DIAGNOSIS — Z9841 Cataract extraction status, right eye: Secondary | ICD-10-CM | POA: Diagnosis not present

## 2019-02-03 DIAGNOSIS — I495 Sick sinus syndrome: Secondary | ICD-10-CM | POA: Diagnosis not present

## 2019-02-03 DIAGNOSIS — F5104 Psychophysiologic insomnia: Secondary | ICD-10-CM | POA: Insufficient documentation

## 2019-02-03 DIAGNOSIS — Z9842 Cataract extraction status, left eye: Secondary | ICD-10-CM | POA: Diagnosis not present

## 2019-02-03 DIAGNOSIS — Z809 Family history of malignant neoplasm, unspecified: Secondary | ICD-10-CM | POA: Insufficient documentation

## 2019-02-03 DIAGNOSIS — R569 Unspecified convulsions: Secondary | ICD-10-CM | POA: Diagnosis not present

## 2019-02-03 DIAGNOSIS — Z801 Family history of malignant neoplasm of trachea, bronchus and lung: Secondary | ICD-10-CM | POA: Insufficient documentation

## 2019-02-03 DIAGNOSIS — I714 Abdominal aortic aneurysm, without rupture: Secondary | ICD-10-CM | POA: Diagnosis not present

## 2019-02-03 DIAGNOSIS — J449 Chronic obstructive pulmonary disease, unspecified: Secondary | ICD-10-CM | POA: Diagnosis not present

## 2019-02-03 DIAGNOSIS — I2510 Atherosclerotic heart disease of native coronary artery without angina pectoris: Secondary | ICD-10-CM | POA: Diagnosis not present

## 2019-02-03 DIAGNOSIS — I252 Old myocardial infarction: Secondary | ICD-10-CM | POA: Diagnosis not present

## 2019-02-03 DIAGNOSIS — F329 Major depressive disorder, single episode, unspecified: Secondary | ICD-10-CM | POA: Insufficient documentation

## 2019-02-03 DIAGNOSIS — Z961 Presence of intraocular lens: Secondary | ICD-10-CM | POA: Diagnosis not present

## 2019-02-03 DIAGNOSIS — Z880 Allergy status to penicillin: Secondary | ICD-10-CM | POA: Insufficient documentation

## 2019-02-03 DIAGNOSIS — I69354 Hemiplegia and hemiparesis following cerebral infarction affecting left non-dominant side: Secondary | ICD-10-CM | POA: Insufficient documentation

## 2019-02-03 DIAGNOSIS — H9193 Unspecified hearing loss, bilateral: Secondary | ICD-10-CM | POA: Diagnosis not present

## 2019-02-03 HISTORY — DX: Depression, unspecified: F32.A

## 2019-02-03 HISTORY — PX: CYSTOSCOPY/URETEROSCOPY/HOLMIUM LASER/STENT PLACEMENT: SHX6546

## 2019-02-03 HISTORY — DX: Gastro-esophageal reflux disease without esophagitis: K21.9

## 2019-02-03 HISTORY — DX: Major depressive disorder, single episode, unspecified: F32.9

## 2019-02-03 HISTORY — DX: Malignant (primary) neoplasm, unspecified: C80.1

## 2019-02-03 HISTORY — DX: Unspecified convulsions: R56.9

## 2019-02-03 HISTORY — DX: Sleep apnea, unspecified: G47.30

## 2019-02-03 HISTORY — DX: Unspecified dementia, unspecified severity, without behavioral disturbance, psychotic disturbance, mood disturbance, and anxiety: F03.90

## 2019-02-03 HISTORY — DX: Anxiety disorder, unspecified: F41.9

## 2019-02-03 LAB — BASIC METABOLIC PANEL
Anion gap: 9 (ref 5–15)
BUN: 18 mg/dL (ref 8–23)
CHLORIDE: 103 mmol/L (ref 98–111)
CO2: 24 mmol/L (ref 22–32)
Calcium: 8.7 mg/dL — ABNORMAL LOW (ref 8.9–10.3)
Creatinine, Ser: 0.84 mg/dL (ref 0.61–1.24)
GFR calc Af Amer: >60 mL/min (ref >60)
GFR calc non Af Amer: >60 mL/min (ref >60)
Glucose, Bld: 240 mg/dL — ABNORMAL HIGH (ref 70–99)
Potassium: 3.6 mmol/L (ref 3.5–5.1)
Sodium: 136 mmol/L (ref 135–145)

## 2019-02-03 LAB — GLUCOSE, CAPILLARY
Glucose-Capillary: 232 mg/dL — ABNORMAL HIGH (ref 70–99)
Glucose-Capillary: 210 mg/dL — ABNORMAL HIGH (ref 70–99)

## 2019-02-03 LAB — CBC
HCT: 41.8 % (ref 39.0–52.0)
Hemoglobin: 13.2 g/dL (ref 13.0–17.0)
MCH: 27.4 pg (ref 26.0–34.0)
MCHC: 31.6 g/dL (ref 30.0–36.0)
MCV: 86.7 fL (ref 80.0–100.0)
Platelets: 162 10*3/uL (ref 150–400)
RBC: 4.82 MIL/uL (ref 4.22–5.81)
RDW: 13.7 % (ref 11.5–15.5)
WBC: 6.1 10*3/uL (ref 4.0–10.5)
nRBC: 0 % (ref 0.0–0.2)

## 2019-02-03 SURGERY — CYSTOSCOPY/URETEROSCOPY/HOLMIUM LASER/STENT PLACEMENT
Anesthesia: General | Site: Ureter | Laterality: Left

## 2019-02-03 MED ORDER — ONDANSETRON HCL 4 MG/2ML IJ SOLN
INTRAMUSCULAR | Status: DC | PRN
  Administered 2019-02-03: 4 mg via INTRAVENOUS

## 2019-02-03 MED ORDER — OXYCODONE HCL 5 MG/5ML PO SOLN: 5.0000 mg | Freq: Once | ORAL | Status: AC | PRN

## 2019-02-03 MED ORDER — PHENYLEPHRINE 40 MCG/ML (10ML) SYRINGE FOR IV PUSH (FOR BLOOD PRESSURE SUPPORT)
PREFILLED_SYRINGE | INTRAVENOUS | Status: AC
  Filled 2019-02-03: qty 10

## 2019-02-03 MED ORDER — LIDOCAINE 2% (20 MG/ML) 5 ML SYRINGE
INTRAMUSCULAR | Status: DC | PRN
  Administered 2019-02-03: 80 mg via INTRAVENOUS

## 2019-02-03 MED ORDER — OXYCODONE HCL 5 MG PO TABS
ORAL_TABLET | ORAL | Status: AC
  Filled 2019-02-03: qty 1

## 2019-02-03 MED ORDER — PROPOFOL 10 MG/ML IV BOLUS
INTRAVENOUS | Status: AC
  Filled 2019-02-03: qty 20

## 2019-02-03 MED ORDER — LACTATED RINGERS IV SOLN
INTRAVENOUS | Status: DC
  Administered 2019-02-03: 12:00:00 via INTRAVENOUS

## 2019-02-03 MED ORDER — FENTANYL CITRATE (PF) 100 MCG/2ML IJ SOLN
INTRAMUSCULAR | Status: AC
  Filled 2019-02-03: qty 2

## 2019-02-03 MED ORDER — ACETAMINOPHEN 500 MG PO TABS: 1000.0000 mg | ORAL_TABLET | Freq: Once | ORAL | Status: DC | PRN

## 2019-02-03 MED ORDER — FENTANYL CITRATE (PF) 100 MCG/2ML IJ SOLN
25.0000 ug | INTRAMUSCULAR | Status: DC | PRN
  Administered 2019-02-03 (×4): 50 ug via INTRAVENOUS

## 2019-02-03 MED ORDER — OXYCODONE-ACETAMINOPHEN 5-325 MG PO TABS: 1.0000 | ORAL_TABLET | Freq: Four times a day (QID) | ORAL | 0 refills | Status: DC | PRN

## 2019-02-03 MED ORDER — OXYCODONE HCL 5 MG PO TABS
5.0000 mg | ORAL_TABLET | Freq: Once | ORAL | Status: AC | PRN
  Administered 2019-02-03: 5 mg via ORAL

## 2019-02-03 MED ORDER — HYDROMORPHONE HCL 1 MG/ML IJ SOLN
INTRAMUSCULAR | Status: AC
  Filled 2019-02-03: qty 1

## 2019-02-03 MED ORDER — PROPOFOL 10 MG/ML IV BOLUS
INTRAVENOUS | Status: DC | PRN
  Administered 2019-02-03: 150 mg via INTRAVENOUS
  Administered 2019-02-03: 50 mg via INTRAVENOUS

## 2019-02-03 MED ORDER — ACETAMINOPHEN 10 MG/ML IV SOLN
1000.0000 mg | Freq: Once | INTRAVENOUS | Status: DC | PRN
  Administered 2019-02-03: 1000 mg via INTRAVENOUS

## 2019-02-03 MED ORDER — ONDANSETRON HCL 4 MG/2ML IJ SOLN
INTRAMUSCULAR | Status: AC
  Filled 2019-02-03: qty 2

## 2019-02-03 MED ORDER — MIDAZOLAM HCL 2 MG/2ML IJ SOLN
INTRAMUSCULAR | Status: DC | PRN
  Administered 2019-02-03: 2 mg via INTRAVENOUS

## 2019-02-03 MED ORDER — FENTANYL CITRATE (PF) 100 MCG/2ML IJ SOLN
INTRAMUSCULAR | Status: DC | PRN
  Administered 2019-02-03 (×5): 25 ug via INTRAVENOUS

## 2019-02-03 MED ORDER — SODIUM CHLORIDE 0.9 % IR SOLN
Status: DC | PRN
  Administered 2019-02-03: 6000 mL

## 2019-02-03 MED ORDER — IOHEXOL 300 MG/ML  SOLN
INTRAMUSCULAR | Status: DC | PRN
  Administered 2019-02-03: 11 mL

## 2019-02-03 MED ORDER — MIDAZOLAM HCL 2 MG/2ML IJ SOLN
INTRAMUSCULAR | Status: AC
  Filled 2019-02-03: qty 2

## 2019-02-03 MED ORDER — SUGAMMADEX SODIUM 500 MG/5ML IV SOLN
INTRAVENOUS | Status: AC
  Filled 2019-02-03: qty 5

## 2019-02-03 MED ORDER — LIDOCAINE 2% (20 MG/ML) 5 ML SYRINGE
INTRAMUSCULAR | Status: AC
  Filled 2019-02-03: qty 5

## 2019-02-03 MED ORDER — ACETAMINOPHEN 160 MG/5ML PO SOLN: 1000.0000 mg | Freq: Once | ORAL | Status: DC | PRN

## 2019-02-03 MED ORDER — ACETAMINOPHEN 10 MG/ML IV SOLN
INTRAVENOUS | Status: AC
  Filled 2019-02-03: qty 100

## 2019-02-03 MED ORDER — HYDROMORPHONE HCL 1 MG/ML IJ SOLN
0.2500 mg | INTRAMUSCULAR | Status: DC | PRN
  Administered 2019-02-03 (×2): 0.25 mg via INTRAVENOUS

## 2019-02-03 SURGICAL SUPPLY — 25 items
BAG URO CATCHER STRL LF (MISCELLANEOUS) ×2 IMPLANT
BASKET LASER NITINOL 1.9FR (BASKET) IMPLANT
CATH INTERMIT  6FR 70CM (CATHETERS) ×2 IMPLANT
CLOTH BEACON ORANGE TIMEOUT ST (SAFETY) ×2 IMPLANT
COVER WAND RF STERILE (DRAPES) IMPLANT
EXTRACTOR STONE 1.7FRX115CM (UROLOGICAL SUPPLIES) IMPLANT
FIBER LASER FLEXIVA 1000 (UROLOGICAL SUPPLIES) IMPLANT
FIBER LASER FLEXIVA 365 (UROLOGICAL SUPPLIES) IMPLANT
FIBER LASER FLEXIVA 550 (UROLOGICAL SUPPLIES) IMPLANT
FIBER LASER TRAC TIP (UROLOGICAL SUPPLIES) ×2 IMPLANT
GLOVE BIOGEL M STRL SZ7.5 (GLOVE) ×2 IMPLANT
GOWN STRL REUS W/TWL LRG LVL3 (GOWN DISPOSABLE) ×4 IMPLANT
GUIDEWIRE ANG ZIPWIRE 038X150 (WIRE) ×2 IMPLANT
GUIDEWIRE STR DUAL SENSOR (WIRE) ×2 IMPLANT
IV NS 1000ML (IV SOLUTION) ×2
IV NS 1000ML BAXH (IV SOLUTION) ×1 IMPLANT
MANIFOLD NEPTUNE II (INSTRUMENTS) ×2 IMPLANT
PACK CYSTO (CUSTOM PROCEDURE TRAY) ×2 IMPLANT
SHEATH URETERAL 12FRX28CM (UROLOGICAL SUPPLIES) IMPLANT
SHEATH URETERAL 12FRX35CM (MISCELLANEOUS) ×4 IMPLANT
STENT POLARIS 5FRX26 (STENTS) ×2 IMPLANT
SYR CONTROL 10ML LL (SYRINGE) ×2 IMPLANT
TUBE FEEDING 8FR 16IN STR KANG (MISCELLANEOUS) ×2 IMPLANT
TUBING CONNECTING 10 (TUBING) ×2 IMPLANT
TUBING UROLOGY SET (TUBING) ×2 IMPLANT
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
## 2019-02-03 NOTE — Brief Op Note (Signed)
02/03/2019  2:04 PM  PATIENT:  Wesley [name]  63 y.o. male  PRE-OPERATIVE DIAGNOSIS:  LEFT RENAL PELVIS CANCER  POST-OPERATIVE DIAGNOSIS:  LEFT RENAL PELVIS CANCER  PROCEDURE:  Procedure(s) with comments: CYSTOSCOPY/RETROGRADE PYELOGRAM/ URETEROSCOPY/HOLMIUM LASER/STENT PLACEMENT LASER ABLATION OF RENAL PELVIS CANCER (Left) - 75 MINS  SURGEON:  Surgeon(s) and Role:    Alexis Frock, MD - Primary  PHYSICIAN ASSISTANT:   ASSISTANTS: none   ANESTHESIA:   general  EBL:  minimal   BLOOD ADMINISTERED:none  DRAINS: none   LOCAL MEDICATIONS USED:  NONE  SPECIMEN:  No Specimen  DISPOSITION OF SPECIMEN:  N/A  COUNTS:  YES  TOURNIQUET:  * No tourniquets in log *  DICTATION: .Other Dictation: Dictation Number [id number]  PLAN OF CARE: Discharge to home after PACU  PATIENT DISPOSITION:  PACU - hemodynamically stable.   Delay start of Pharmacological VTE agent (>24hrs) due to surgical blood loss or risk of bleeding: yes

## 2019-02-03 NOTE — Op Note (Signed)
NAME: Wesley [name], EISNER MEDICAL RECORD [id number] ACCOUNT [id number] DATE OF BIRTH:1956-01-29 FAC[facility] LOCATION: [facility] PHYSICIAN:Wardell Pokorski, MD  OPERATIVE REPORT  DATE OF PROCEDURE:  02/03/2019  PREOPERATIVE DIAGNOSIS:  Left renal pelvis cancer.  PROCEDURE:  First stage laser ablation of renal pelvis cancer.  ESTIMATED BLOOD LOSS:  Nil.  COMPLICATIONS:  None.  SPECIMENS:  None.  FINDINGS: 1.  Stable left extreme upper pole urothelial carcinoma, papillary type.  Estimated volume approximately 2 cm2. 2.  Ablation of approximately 70% of the renal pelvis tumor visible today by laser ablation technique. 3.  Successful replacement of left ureteral stent, proximal end renal pelvis, distal end in urinary bladder 5 x 26.  INDICATIONS:  The patient is a pleasant but significantly comorbid 63 year old man who was found on workup of hematuria to have a left renal pelvis filling defect in the upper pole.  This was somewhat concerning for intraluminal neoplasm.  He underwent a  diagnostic ureteroscopy and biopsy last month, which did corroborate urothelial carcinoma of the left upper pole renal pelvis.  He also has extensive surgical history.  Options were discussed for management including nephroureterectomy versus staged  ablation.  Given the low-grade nature of the tumor and his comorbidity, it was medically and surgically was felt that laser ablation with a staged technique would be most prudent and he wished to proceed.  Informed consent was then placed in medical  record.  DESCRIPTION OF PROCEDURE:  The patient was identified.  The procedure being left first stage ureteroscopic laser ablation of left renal pelvis tumor was confirmed.  Procedure timeout was performed.  IV antibiotics administered.  General LMA anesthesia  induced.  The patient was placed into a low lithotomy position, sterile field was created prepped and draped his penis, perineum and proximal thighs using  iodine.  Cystourethroscopy was performed using 22-French rigid cystoscope with offset lens.   Inspection of anterior and posterior urethra were unremarkable.  Inspection of bladder revealed distal end of left ureteral stent in situ.  It was grasped, brought to the level of the urethral meatus.  A 0.038 ZIPwire was advanced to the lower pole and  set aside as a safety wire and the stent was exchanged for an open-ended catheter and left retrograde pyelogram was obtained.  Left retrograde pyelogram demonstrated a single left ureter, single system left kidney.  No evidence of renal perforation noted.  There was a slight mucosal irregularity noted in the upper pole consistent with known tumor, no large filling defects.  The  ZIPwire was once again advanced and a second wire being a sensor type was placed to the level of the left renal pelvis via the open-ended catheter and ureteroscope acting as a working wire over which a 12/14, 35 cm ureteral access sheath was carefully  placed at the level of the proximal ureter and flexible digital ureteroscopy performed the proximal left ureter and systematic inspection of the left kidney and as suspected there is multifocal papillary tumor in the upper pole approximately 2 cm surface  area involved.  Holmium laser energy applied to the tumor using settings of 1 joule and 10 Hz and using a defocused technique laser energy was applied to the papillary tumor down to what appeared to be the superficial fibromuscular stroma of the renal  pelvis tissue.  Using this technique, it was felt that approximately 70% of the tumor was ablated today.  Given the inherent amount of debris, mucosal irritation and oozing with this technique, visualization became somewhat poor.  It was felt that we  should stop the first stage procedure today as planned.  Additional retrograde pyelogram was obtained which revealed no large evidence of renal perforation.  Hemostasis appeared acceptable.  The  access sheath was removed under continuous vision.  No  significant mucosal abnormalities were found, and finally a new 5 x 26 Polaris-type stent was placed with remaining safety wire using fluoroscopic guidance.  Good proximal and distal plane were noted, and the procedure was terminated.  The patient  tolerated the procedure well.  No immediate perioperative complications.  The patient was taken to postanesthesia care in stable condition with plan for discharge home and proceeding with a second-stage procedure in several weeks.  TN/NUANCE  D:02/03/2019 T:02/03/2019 JOB:[id number]

## 2019-02-03 NOTE — Discharge Instructions (Signed)
1 - You may have urinary urgency (bladder spasms) and bloody urine on / off with stent in place. This is normal. ° °2 - Call MD or go to ER for fever >102, severe pain / nausea / vomiting not relieved by medications, or acute change in medical status ° °

## 2019-02-03 NOTE — Progress Notes (Signed)
Spoke with Dr. Ermalene Postin notified of NO pacemaker orders in chart. Referred to Note in chart (01/03/19) that stated his pacer was interrogated on 11/02/18- and seen Cardiologist same day.    Dr. Ermalene Postin of [facility].

## 2019-02-03 NOTE — Anesthesia Preprocedure Evaluation (Addendum)
Anesthesia Evaluation  Patient identified by MRN, date of birth, ID band Patient awake    Reviewed: Allergy & Precautions, NPO status , Patient's Chart, lab work & pertinent test results  History of Anesthesia Complications Negative for: history of anesthetic complications  Airway Mallampati: II  TM Distance: >3 FB Neck ROM: Full    Dental  (+) Teeth Intact   Pulmonary shortness of breath, sleep apnea , COPD,  COPD inhaler, neg recent URI, former smoker,    breath sounds clear to auscultation       Cardiovascular hypertension, + CAD, + Past MI and + Peripheral Vascular Disease  + pacemaker  Rhythm:Regular      Neuro/Psych Seizures -,  PSYCHIATRIC DISORDERS Anxiety Depression Dementia Left weakness  Neuromuscular disease CVA, Residual Symptoms    GI/Hepatic GERD  ,(+)     substance abuse  cocaine use, Not acutely toxic   Endo/Other  diabetes  Renal/GU      Musculoskeletal  (+) Arthritis ,   Abdominal   Peds  Hematology   Anesthesia Other Findings   Reproductive/Obstetrics                            Anesthesia Physical Anesthesia Plan  ASA: III  Anesthesia Plan: General   Post-op Pain Management:    Induction: Intravenous  PONV Risk Score and Plan: 2 and Ondansetron and Dexamethasone  Airway Management Planned: LMA and Oral ETT  Additional Equipment: None  Intra-op Plan:   Post-operative Plan: Extubation in OR  Informed Consent: I have reviewed the patients History and Physical, chart, labs and discussed the procedure including the risks, benefits and alternatives for the proposed anesthesia with the patient or authorized representative who has indicated his/her understanding and acceptance.     Dental advisory given  Plan Discussed with: CRNA and Surgeon  Anesthesia Plan Comments:         Anesthesia Quick Evaluation

## 2019-02-03 NOTE — Anesthesia Procedure Notes (Signed)
Procedure Name: LMA Insertion Date/Time: 02/03/2019 1:18 PM Performed by: Niel Hummer, CRNA Pre-anesthesia Checklist: Patient identified, Emergency Drugs available, Suction available and Patient being monitored Patient Re-evaluated:Patient Re-evaluated prior to induction Oxygen Delivery Method: Circle system utilized Preoxygenation: Pre-oxygenation with 100% oxygen Induction Type: IV induction Ventilation: Mask ventilation without difficulty LMA: LMA inserted LMA Size: 5.0 Dental Injury: Teeth and Oropharynx as per pre-operative assessment

## 2019-02-03 NOTE — Progress Notes (Signed)
Dr. Ermalene Postin notified that pt usually does NOT cover high blood sugars at home with insulin.  He usually drinks lots of fluids and "walks around" to lower his blood sugar.  No new orders at this time from Dr. Ermalene Postin.

## 2019-02-03 NOTE — Transfer of Care (Signed)
Immediate Anesthesia Transfer of Care Note  Patient: Wesley [name]  Procedure(s) Performed: CYSTOSCOPY/RETROGRADE PYELOGRAM/ URETEROSCOPY/HOLMIUM LASER/STENT PLACEMENT LASER ABLATION OF RENAL PELVIS CANCER (Left Ureter)  Patient Location: PACU  Anesthesia Type:General  Level of Consciousness: drowsy  Airway & Oxygen Therapy: Patient Spontanous Breathing and Patient connected to face mask oxygen  Post-op Assessment: Report given to RN and Post -op Vital signs reviewed and stable  Post vital signs: Reviewed and stable  Last Vitals:  Vitals Value Taken Time  BP    Temp    Pulse 59 02/03/2019  2:15 PM  Resp 15 02/03/2019  2:15 PM  SpO2 100 % 02/03/2019  2:15 PM  Vitals shown include unvalidated device data.  Last Pain:  Vitals:   02/03/19 1129  TempSrc:   PainSc: 0-No pain      Patients Stated Pain Goal: 3 (20/03/79 4446)  Complications: No apparent anesthesia complications

## 2019-02-04 ENCOUNTER — Encounter (HOSPITAL_COMMUNITY): Payer: Self-pay | Admitting: Urology

## 2019-02-04 LAB — HEMOGLOBIN A1C
Hgb A1c MFr Bld: 9.1 % — ABNORMAL HIGH (ref 4.8–5.6)
Mean Plasma Glucose: 214 mg/dL

## 2019-02-08 NOTE — Anesthesia Postprocedure Evaluation (Signed)
Anesthesia Post Note  Patient: Wesley [name]  Procedure(s) Performed: CYSTOSCOPY/RETROGRADE PYELOGRAM/ URETEROSCOPY/HOLMIUM LASER/STENT PLACEMENT LASER ABLATION OF RENAL PELVIS CANCER (Left Ureter)     Patient location during evaluation: PACU Anesthesia Type: General Level of consciousness: awake and alert Pain management: pain level controlled Vital Signs Assessment: post-procedure vital signs reviewed and stable Respiratory status: spontaneous breathing, nonlabored ventilation, respiratory function stable and patient connected to nasal cannula oxygen Cardiovascular status: blood pressure returned to baseline and stable Postop Assessment: no apparent nausea or vomiting Anesthetic complications: no    Last Vitals:  Vitals:   02/03/19 1615 02/03/19 1625  BP: (!) 141/87 138/87  Pulse: 65 60  Resp: 16 18  Temp: 36.6 C 36.6 C  SpO2: 92% 92%    Last Pain:  Vitals:   02/03/19 1615  TempSrc:   PainSc: 5                  Mally Gavina

## 2019-02-11 NOTE — Patient Instructions (Addendum)
Wesley [name]  02/11/2019   Your procedure is scheduled on: 02-18-19     Report to [facility] Main  Entrance     Report to Admitting at 11:45 AM    Call this number if you have problems the morning of surgery [phone]    Remember: Do not eat food or drink liquids :After Midnight.  You may have a Clear Liquid Diet from Midnight until 7:45 AM. After 7:45 AM, nothing until after surgery.    CLEAR LIQUID DIET   Foods Allowed                                                                     Foods Excluded  Coffee and tea, regular and decaf                             liquids that you cannot  Plain Jell-O in any flavor                                             see through such as: Fruit ices (not with fruit pulp)                                     milk, soups, orange juice  Iced Popsicles                                    All solid food Carbonated beverages, regular and diet                                    Cranberry, grape and apple juices Sports drinks like Gatorade Lightly seasoned clear broth or consume(fat free) Sugar, honey syrup  Sample Menu Breakfast                                Lunch                                     Supper Cranberry juice                    Beef broth                            Chicken broth Jell-O                                     Grape juice  Apple juice Coffee or tea                        Jell-O                                      Popsicle                                                Coffee or tea                        Coffee or tea  _____________________________________________________________________      BRUSH YOUR TEETH MORNING OF SURGERY AND RINSE YOUR MOUTH OUT, NO CHEWING GUM CANDY OR MINTS.     Take these medicines the morning of surgery with A SIP OF WATER: Cetirizine (Zyrtec), prn  Diltiazem (Cardizem), Levetiracetam (Keppra), Gabapentin (Neuontin), and  Pantoprazole (Protonix). You may also bring and use your eyedrops, nasal spray, and eyedrops   DO NOT TAKE ANY DIABETIC MEDICATIONS DAY OF YOUR SURGERY                               You may not have any metal on your body including hair pins and              piercings  Do not wear jewelry, colgone, lotions, powders or perfumes, deodorant             Men may shave face and neck.   Do not bring valuables to the hospital. [facility].  Contacts, dentures or bridgework may not be worn into surgery.       Patients discharged the day of surgery will not be allowed to drive home. IF YOU ARE HAVING SURGERY AND GOING HOME THE SAME DAY, YOU MUST HAVE AN ADULT TO DRIVE YOU HOME AND BE WITH YOU FOR 24 HOURS. YOU MAY GO HOME BY TAXI OR UBER OR ORTHERWISE, BUT AN ADULT MUST ACCOMPANY YOU HOME AND STAY WITH YOU FOR 24 HOURS.    Special Instructions: Wesley [name] [phone]              Please read over the following fact sheets you were given: _____________________________________________________________________  How to Manage Your Diabetes Before and After Surgery  Why is it important to control my blood sugar before and after surgery? . Improving blood sugar levels before and after surgery helps healing and can limit problems. . A way of improving blood sugar control is eating a healthy diet by: o  Eating less sugar and carbohydrates o  Increasing activity/exercise o  Talking with your doctor about reaching your blood sugar goals . High blood sugars (greater than 180 mg/dL) can raise your risk of infections and slow your recovery, so you will need to focus on controlling your diabetes during the weeks before surgery. . Make sure that the doctor who takes care of your diabetes knows about your planned surgery including the date and location.  How do I manage my blood sugar before surgery? . Check your blood sugar at least 4 times  a day, starting 2  days before surgery, to make sure that the level is not too high or low. o Check your blood sugar the morning of your surgery when you wake up and every 2 hours until you get to the Short Stay unit. . If your blood sugar is less than 70 mg/dL, you will need to treat for low blood sugar: o Do not take insulin. o Treat a low blood sugar (less than 70 mg/dL) with  cup of clear juice (cranberry or apple), 4 glucose tablets, OR glucose gel. o Recheck blood sugar in 15 minutes after treatment (to make sure it is greater than 70 mg/dL). If your blood sugar is not greater than 70 mg/dL on recheck, call [phone] for further instructions. . Report your blood sugar to the short stay nurse when you get to Short Stay.  . If you are admitted to the hospital after surgery: o Your blood sugar will be checked by the staff and you will probably be given insulin after surgery (instead of oral diabetes medicines) to make sure you have good blood sugar levels. o The goal for blood sugar control after surgery is 80-180 mg/dL.   WHAT DO I DO ABOUT MY DIABETES MEDICATION?  Marland Kitchen Do not take oral diabetes medicines (pills) the morning of surgery.  . THE DAY BEFORE SURGERY, take your usual Metformin and only 10 units of Lantus insulin at bedtime.         Reviewed and Endorsed by [facility] Patient Education Committee, August 2015           [facility] - Preparing for Surgery Before surgery, you can play an important role.  Because skin is not sterile, your skin needs to be as free of germs as possible.  You can reduce the number of germs on your skin by washing with CHG (chlorahexidine gluconate) soap before surgery.  CHG is an antiseptic cleaner which kills germs and bonds with the skin to continue killing germs even after washing. Please DO NOT use if you have an allergy to CHG or antibacterial soaps.  If your skin becomes reddened/irritated stop using the CHG and inform your nurse when you arrive at Short  Stay. Do not shave (including legs and underarms) for at least 48 hours prior to the first CHG shower.  You may shave your face/neck. Please follow these instructions carefully:  1.  Shower with CHG Soap the night before surgery and the  morning of Surgery.  2.  If you choose to wash your hair, wash your hair first as usual with your  normal  shampoo.  3.  After you shampoo, rinse your hair and body thoroughly to remove the  shampoo.                           4.  Use CHG as you would any other liquid soap.  You can apply chg directly  to the skin and wash                       Gently with a scrungie or clean washcloth.  5.  Apply the CHG Soap to your body ONLY FROM THE NECK DOWN.   Do not use on face/ open                           Wound or open sores. Avoid contact with eyes,  ears mouth and genitals (private parts).                       Wash face,  Genitals (private parts) with your normal soap.             6.  Wash thoroughly, paying special attention to the area where your surgery  will be performed.  7.  Thoroughly rinse your body with warm water from the neck down.  8.  DO NOT shower/wash with your normal soap after using and rinsing off  the CHG Soap.                9.  Pat yourself dry with a clean towel.            10.  Wear clean pajamas.            11.  Place clean sheets on your bed the night of your first shower and do not  sleep with pets. Day of Surgery : Do not apply any lotions/deodorants the morning of surgery.  Please wear clean clothes to the hospital/surgery center.  FAILURE TO FOLLOW THESE INSTRUCTIONS MAY RESULT IN THE CANCELLATION OF YOUR SURGERY PATIENT SIGNATURE_________________________________  NURSE SIGNATURE__________________________________  ________________________________________________________________________

## 2019-02-13 DIAGNOSIS — A419 Sepsis, unspecified organism: Secondary | ICD-10-CM

## 2019-02-13 HISTORY — DX: Sepsis, unspecified organism: A41.9

## 2019-02-14 ENCOUNTER — Encounter (HOSPITAL_COMMUNITY)
Admission: RE | Admit: 2019-02-14 | Discharge: 2019-02-14 | Disposition: A | Discharge status: Home / Self Care | Payer: No Typology Code available for payment source | Source: Ambulatory Visit | Attending: Urology | Admitting: Urology

## 2019-02-14 ENCOUNTER — Other Ambulatory Visit: Payer: Self-pay

## 2019-02-14 ENCOUNTER — Encounter (HOSPITAL_COMMUNITY): Payer: Self-pay

## 2019-02-14 DIAGNOSIS — Z01812 Encounter for preprocedural laboratory examination: Secondary | ICD-10-CM | POA: Insufficient documentation

## 2019-02-14 LAB — BASIC METABOLIC PANEL
Anion gap: 11 (ref 5–15)
BUN: 12 mg/dL (ref 8–23)
CO2: 25 mmol/L (ref 22–32)
Calcium: 9.6 mg/dL (ref 8.9–10.3)
Chloride: 101 mmol/L (ref 98–111)
Creatinine, Ser: 0.9 mg/dL (ref 0.61–1.24)
GFR calc Af Amer: >60 mL/min (ref >60)
GFR calc non Af Amer: >60 mL/min (ref >60)
Glucose, Bld: 316 mg/dL — ABNORMAL HIGH (ref 70–99)
POTASSIUM: 4.1 mmol/L (ref 3.5–5.1)
Sodium: 137 mmol/L (ref 135–145)

## 2019-02-14 LAB — CBC
HCT: 45.2 % (ref 39.0–52.0)
Hemoglobin: 13.6 g/dL (ref 13.0–17.0)
MCH: 26.4 pg (ref 26.0–34.0)
MCHC: 30.1 g/dL (ref 30.0–36.0)
MCV: 87.6 fL (ref 80.0–100.0)
Platelets: 218 10*3/uL (ref 150–400)
RBC: 5.16 MIL/uL (ref 4.22–5.81)
RDW: 13.7 % (ref 11.5–15.5)
WBC: 6.4 10*3/uL (ref 4.0–10.5)
nRBC: 0 % (ref 0.0–0.2)

## 2019-02-14 LAB — GLUCOSE, CAPILLARY: Glucose-Capillary: 355 mg/dL — ABNORMAL HIGH (ref 70–99)

## 2019-02-14 LAB — HEMOGLOBIN A1C
Hgb A1c MFr Bld: 9.7 % — ABNORMAL HIGH (ref 4.8–5.6)
Mean Plasma Glucose: 231.69 mg/dL

## 2019-02-14 NOTE — Progress Notes (Addendum)
11-24-18 (Epic) EKG  11-23-18 (Epic)CXR  06-13-18 (Epic) ECHO    02-14-19 HGA1C routed to Dr. Tresa Moore for review
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
## 2019-02-16 NOTE — Progress Notes (Signed)
Due to increase in HGBA1C Diabetic Coordinator was contacted per request of Glennis Brink ( Anesthesia) to see patient.  Diabetic Coordinator, Morrison Old will see patient at 1230pm on 02/18/2019 in Short Stay prior to surgery.

## 2019-02-16 NOTE — Anesthesia Preprocedure Evaluation (Deleted)
Anesthesia Evaluation    Airway        Dental   Pulmonary former smoker,           Cardiovascular hypertension,      Neuro/Psych    GI/Hepatic   Endo/Other  diabetes  Renal/GU      Musculoskeletal   Abdominal   Peds  Hematology   Anesthesia Other Findings   Reproductive/Obstetrics                             Anesthesia Physical Anesthesia Plan  ASA:   Anesthesia Plan:    Post-op Pain Management:    Induction:   PONV Risk Score and Plan:   Airway Management Planned:   Additional Equipment:   Intra-op Plan:   Post-operative Plan:   Informed Consent:   Plan Discussed with:   Anesthesia Plan Comments: (See PAT note 02/14/19, Konrad Felix, PA-C)        Anesthesia Quick Evaluation

## 2019-02-17 MED ORDER — GENTAMICIN SULFATE 40 MG/ML IJ SOLN
5.0000 mg/kg | INTRAVENOUS | Status: AC
  Administered 2019-02-18: 440 mg via INTRAVENOUS
  Filled 2019-02-17: qty 11

## 2019-02-18 ENCOUNTER — Encounter (HOSPITAL_COMMUNITY): Payer: Self-pay | Admitting: *Deleted

## 2019-02-18 ENCOUNTER — Ambulatory Visit (HOSPITAL_COMMUNITY): Payer: No Typology Code available for payment source

## 2019-02-18 ENCOUNTER — Ambulatory Visit (HOSPITAL_COMMUNITY): Payer: No Typology Code available for payment source | Admitting: Certified Registered"

## 2019-02-18 ENCOUNTER — Ambulatory Visit (HOSPITAL_COMMUNITY): Payer: No Typology Code available for payment source | Admitting: Physician Assistant

## 2019-02-18 ENCOUNTER — Ambulatory Visit (HOSPITAL_COMMUNITY)
Admission: RE | Admit: 2019-02-18 | Discharge: 2019-02-18 | Disposition: A | Discharge status: Home / Self Care | Payer: No Typology Code available for payment source | Attending: Urology | Admitting: Urology

## 2019-02-18 ENCOUNTER — Encounter (INDEPENDENT_AMBULATORY_CARE_PROVIDER_SITE_OTHER): Payer: Self-pay

## 2019-02-18 ENCOUNTER — Encounter (HOSPITAL_COMMUNITY)
Admission: RE | Disposition: A | Discharge status: Home / Self Care | Payer: Self-pay | Source: Home / Self Care | Attending: Urology

## 2019-02-18 DIAGNOSIS — I252 Old myocardial infarction: Secondary | ICD-10-CM | POA: Diagnosis not present

## 2019-02-18 DIAGNOSIS — C652 Malignant neoplasm of left renal pelvis: Secondary | ICD-10-CM | POA: Diagnosis not present

## 2019-02-18 DIAGNOSIS — Z8673 Personal history of transient ischemic attack (TIA), and cerebral infarction without residual deficits: Secondary | ICD-10-CM | POA: Diagnosis not present

## 2019-02-18 DIAGNOSIS — Z950 Presence of cardiac pacemaker: Secondary | ICD-10-CM | POA: Diagnosis not present

## 2019-02-18 DIAGNOSIS — I10 Essential (primary) hypertension: Secondary | ICD-10-CM | POA: Insufficient documentation

## 2019-02-18 DIAGNOSIS — I2510 Atherosclerotic heart disease of native coronary artery without angina pectoris: Secondary | ICD-10-CM | POA: Insufficient documentation

## 2019-02-18 DIAGNOSIS — E1151 Type 2 diabetes mellitus with diabetic peripheral angiopathy without gangrene: Secondary | ICD-10-CM | POA: Diagnosis not present

## 2019-02-18 DIAGNOSIS — G4730 Sleep apnea, unspecified: Secondary | ICD-10-CM | POA: Insufficient documentation

## 2019-02-18 DIAGNOSIS — Z87891 Personal history of nicotine dependence: Secondary | ICD-10-CM | POA: Diagnosis not present

## 2019-02-18 DIAGNOSIS — I495 Sick sinus syndrome: Secondary | ICD-10-CM | POA: Insufficient documentation

## 2019-02-18 DIAGNOSIS — F0390 Unspecified dementia without behavioral disturbance: Secondary | ICD-10-CM | POA: Insufficient documentation

## 2019-02-18 DIAGNOSIS — J439 Emphysema, unspecified: Secondary | ICD-10-CM | POA: Diagnosis not present

## 2019-02-18 HISTORY — PX: CYSTOSCOPY/URETEROSCOPY/HOLMIUM LASER/STENT PLACEMENT: SHX6546

## 2019-02-18 LAB — GLUCOSE, CAPILLARY: Glucose-Capillary: 232 mg/dL — ABNORMAL HIGH (ref 70–99)

## 2019-02-18 SURGERY — CYSTOSCOPY/URETEROSCOPY/HOLMIUM LASER/STENT PLACEMENT
Anesthesia: General | Laterality: Left

## 2019-02-18 MED ORDER — LIDOCAINE 2% (20 MG/ML) 5 ML SYRINGE
INTRAMUSCULAR | Status: DC | PRN
  Administered 2019-02-18: 100 mg via INTRAVENOUS

## 2019-02-18 MED ORDER — HYDRALAZINE HCL 20 MG/ML IJ SOLN
INTRAMUSCULAR | Status: AC
  Administered 2019-02-18: 5 mg
  Filled 2019-02-18: qty 1

## 2019-02-18 MED ORDER — OXYCODONE-ACETAMINOPHEN 5-325 MG PO TABS: 1.0000 | ORAL_TABLET | Freq: Four times a day (QID) | ORAL | 0 refills | Status: DC | PRN

## 2019-02-18 MED ORDER — MIDAZOLAM HCL 5 MG/5ML IJ SOLN
INTRAMUSCULAR | Status: DC | PRN
  Administered 2019-02-18: 2 mg via INTRAVENOUS

## 2019-02-18 MED ORDER — LIDOCAINE 2% (20 MG/ML) 5 ML SYRINGE
INTRAMUSCULAR | Status: AC
  Filled 2019-02-18: qty 5

## 2019-02-18 MED ORDER — PROPOFOL 10 MG/ML IV BOLUS
INTRAVENOUS | Status: DC | PRN
  Administered 2019-02-18: 180 mg via INTRAVENOUS

## 2019-02-18 MED ORDER — PROPOFOL 10 MG/ML IV BOLUS
INTRAVENOUS | Status: AC
  Filled 2019-02-18: qty 20

## 2019-02-18 MED ORDER — DEXAMETHASONE SODIUM PHOSPHATE 10 MG/ML IJ SOLN
INTRAMUSCULAR | Status: DC | PRN
  Administered 2019-02-18: 10 mg via INTRAVENOUS

## 2019-02-18 MED ORDER — SULFAMETHOXAZOLE-TRIMETHOPRIM 800-160 MG PO TABS: 1.0000 | ORAL_TABLET | Freq: Two times a day (BID) | ORAL | 0 refills | Status: DC

## 2019-02-18 MED ORDER — FENTANYL CITRATE (PF) 100 MCG/2ML IJ SOLN
INTRAMUSCULAR | Status: AC
  Filled 2019-02-18: qty 2

## 2019-02-18 MED ORDER — SODIUM CHLORIDE 0.9 % IR SOLN
Status: DC | PRN
  Administered 2019-02-18: 3000 mL via INTRAVESICAL

## 2019-02-18 MED ORDER — FENTANYL CITRATE (PF) 100 MCG/2ML IJ SOLN
25.0000 ug | INTRAMUSCULAR | Status: DC | PRN
  Administered 2019-02-18 (×2): 50 ug via INTRAVENOUS

## 2019-02-18 MED ORDER — FENTANYL CITRATE (PF) 100 MCG/2ML IJ SOLN
INTRAMUSCULAR | Status: DC | PRN
  Administered 2019-02-18: 50 ug via INTRAVENOUS
  Administered 2019-02-18 (×2): 25 ug via INTRAVENOUS

## 2019-02-18 MED ORDER — OXYCODONE HCL 5 MG/5ML PO SOLN: 5.0000 mg | Freq: Once | ORAL | Status: DC | PRN

## 2019-02-18 MED ORDER — OXYCODONE HCL 5 MG PO TABS: 5.0000 mg | ORAL_TABLET | Freq: Once | ORAL | Status: DC | PRN

## 2019-02-18 MED ORDER — ONDANSETRON HCL 4 MG/2ML IJ SOLN
INTRAMUSCULAR | Status: AC
  Filled 2019-02-18: qty 2

## 2019-02-18 MED ORDER — ONDANSETRON HCL 4 MG/2ML IJ SOLN: 4.0000 mg | Freq: Once | INTRAMUSCULAR | Status: DC | PRN

## 2019-02-18 MED ORDER — SODIUM CHLORIDE 0.9 % IV SOLN
INTRAVENOUS | Status: DC | PRN
  Administered 2019-02-18: 23 mL
  Administered 2019-02-18: 10 mL

## 2019-02-18 MED ORDER — MIDAZOLAM HCL 2 MG/2ML IJ SOLN
INTRAMUSCULAR | Status: AC
  Filled 2019-02-18: qty 2

## 2019-02-18 MED ORDER — ONDANSETRON HCL 4 MG/2ML IJ SOLN
INTRAMUSCULAR | Status: DC | PRN
  Administered 2019-02-18: 4 mg via INTRAVENOUS

## 2019-02-18 MED ORDER — DEXAMETHASONE SODIUM PHOSPHATE 10 MG/ML IJ SOLN
INTRAMUSCULAR | Status: AC
  Filled 2019-02-18: qty 1

## 2019-02-18 MED ORDER — LACTATED RINGERS IV SOLN
INTRAVENOUS | Status: DC
  Administered 2019-02-18: 12:00:00 via INTRAVENOUS

## 2019-02-18 SURGICAL SUPPLY — 25 items
BAG URO CATCHER STRL LF (MISCELLANEOUS) ×3 IMPLANT
BASKET LASER NITINOL 1.9FR (BASKET) IMPLANT
CATH INTERMIT  6FR 70CM (CATHETERS) ×3 IMPLANT
CLOTH BEACON ORANGE TIMEOUT ST (SAFETY) ×3 IMPLANT
COVER WAND RF STERILE (DRAPES) IMPLANT
EXTRACTOR STONE 1.7FRX115CM (UROLOGICAL SUPPLIES) IMPLANT
FIBER LASER FLEXIVA 1000 (UROLOGICAL SUPPLIES) IMPLANT
FIBER LASER FLEXIVA 365 (UROLOGICAL SUPPLIES) IMPLANT
FIBER LASER FLEXIVA 550 (UROLOGICAL SUPPLIES) IMPLANT
FIBER LASER TRAC TIP (UROLOGICAL SUPPLIES) ×3 IMPLANT
GLOVE BIOGEL M STRL SZ7.5 (GLOVE) ×3 IMPLANT
GOWN STRL REUS W/TWL LRG LVL3 (GOWN DISPOSABLE) ×6 IMPLANT
GUIDEWIRE ANG ZIPWIRE 038X150 (WIRE) ×3 IMPLANT
GUIDEWIRE STR DUAL SENSOR (WIRE) ×3 IMPLANT
IV NS 1000ML (IV SOLUTION) ×2
IV NS 1000ML BAXH (IV SOLUTION) ×1 IMPLANT
MANIFOLD NEPTUNE II (INSTRUMENTS) ×3 IMPLANT
PACK CYSTO (CUSTOM PROCEDURE TRAY) ×3 IMPLANT
SHEATH URETERAL 12FRX28CM (UROLOGICAL SUPPLIES) ×3 IMPLANT
SHEATH URETERAL 12FRX35CM (MISCELLANEOUS) IMPLANT
STENT POLARIS 5FRX26 (STENTS) ×3 IMPLANT
SYR CONTROL 10ML LL (SYRINGE) ×3 IMPLANT
TUBE FEEDING 8FR 16IN STR KANG (MISCELLANEOUS) ×3 IMPLANT
TUBING CONNECTING 10 (TUBING) ×2 IMPLANT
TUBING CONNECTING 10' (TUBING) ×1
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
## 2019-02-18 NOTE — Transfer of Care (Signed)
Immediate Anesthesia Transfer of Care Note  Patient: Wesley [name]  Procedure(s) Performed: CYSTOSCOPY/RETROGRADE PYELOGRAM/URETEROSCOPY/HOLMIUM LASER/STENT PLACEMENT (Left )  Patient Location: PACU  Anesthesia Type:General  Level of Consciousness: awake, alert  and oriented  Airway & Oxygen Therapy: Patient Spontanous Breathing and Patient connected to face mask oxygen  Post-op Assessment: Report given to RN and Post -op Vital signs reviewed and stable  Post vital signs: Reviewed and stable  Last Vitals:  Vitals Value Taken Time  BP 175/105 02/18/2019  2:48 PM  Temp    Pulse 59 02/18/2019  2:49 PM  Resp    SpO2 100 % 02/18/2019  2:49 PM  Vitals shown include unvalidated device data.  Last Pain:  Vitals:   02/18/19 1212  TempSrc: Oral         Complications: No apparent anesthesia complications

## 2019-02-18 NOTE — Anesthesia Preprocedure Evaluation (Signed)
Anesthesia Evaluation  Patient identified by MRN, date of birth, ID band Patient awake    Reviewed: Allergy & Precautions, NPO status , Patient's Chart, lab work & pertinent test results  History of Anesthesia Complications Negative for: history of anesthetic complications  Airway Mallampati: II  TM Distance: >3 FB Neck ROM: Full    Dental  (+) Missing,    Pulmonary shortness of breath, sleep apnea , COPD,  COPD inhaler, neg recent URI, former smoker,    Pulmonary exam normal        Cardiovascular hypertension, + CAD, + Past MI and + Peripheral Vascular Disease  Normal cardiovascular exam+ pacemaker      Neuro/Psych Seizures -,  PSYCHIATRIC DISORDERS Anxiety Depression Dementia Left weakness  Neuromuscular disease CVA, Residual Symptoms    GI/Hepatic GERD  ,(+)     substance abuse  cocaine use, Not acutely toxic   Endo/Other  diabetes  Renal/GU      Musculoskeletal  (+) Arthritis ,   Abdominal   Peds  Hematology   Anesthesia Other Findings Pt returns for repeat procedure. Had prior procedure 2 weeks ago. No interval change in health or medical history.   Reproductive/Obstetrics                             Anesthesia Physical  Anesthesia Plan  ASA: III  Anesthesia Plan: General   Post-op Pain Management:    Induction: Intravenous  PONV Risk Score and Plan: 2 and Ondansetron and Dexamethasone  Airway Management Planned: LMA  Additional Equipment: None  Intra-op Plan:   Post-operative Plan: Extubation in OR  Informed Consent: I have reviewed the patients History and Physical, chart, labs and discussed the procedure including the risks, benefits and alternatives for the proposed anesthesia with the patient or authorized representative who has indicated his/her understanding and acceptance.     Dental advisory given  Plan Discussed with:   Anesthesia Plan Comments:          Anesthesia Quick Evaluation

## 2019-02-18 NOTE — Discharge Instructions (Signed)
1 - You may have urinary urgency (bladder spasms) and bloody urine on / off with stent in place. This is normal. ° °2 - Call MD or go to ER for fever >102, severe pain / nausea / vomiting not relieved by medications, or acute change in medical status ° °

## 2019-02-18 NOTE — Anesthesia Postprocedure Evaluation (Signed)
Anesthesia Post Note  Patient: Wesley [name]  Procedure(s) Performed: CYSTOSCOPY/RETROGRADE PYELOGRAM/URETEROSCOPY/HOLMIUM LASER/STENT PLACEMENT (Left )     Patient location during evaluation: PACU Anesthesia Type: General Level of consciousness: sedated Pain management: pain level controlled Vital Signs Assessment: post-procedure vital signs reviewed and stable Respiratory status: spontaneous breathing and respiratory function stable Cardiovascular status: stable Postop Assessment: no apparent nausea or vomiting Anesthetic complications: no    Last Vitals:  Vitals:   02/18/19 1559 02/18/19 1600  BP: (!) 159/92   Pulse:  67  Resp:  18  Temp:    SpO2: 94% 92%    Last Pain:  Vitals:   02/18/19 1545  TempSrc:   PainSc: 4                  [name] DANIEL

## 2019-02-18 NOTE — Anesthesia Procedure Notes (Signed)
Procedure Name: LMA Insertion Date/Time: 02/18/2019 1:45 PM Performed by: Azaria Stegman D, CRNA Pre-anesthesia Checklist: Patient identified, Emergency Drugs available, Suction available and Patient being monitored Patient Re-evaluated:Patient Re-evaluated prior to induction Oxygen Delivery Method: Circle system utilized Preoxygenation: Pre-oxygenation with 100% oxygen Induction Type: IV induction Ventilation: Mask ventilation without difficulty LMA: LMA inserted LMA Size: 4.0 Tube type: Oral Number of attempts: 1 Placement Confirmation: positive ETCO2 and breath sounds checked- equal and bilateral Tube secured with: Tape Dental Injury: Teeth and Oropharynx as per pre-operative assessment

## 2019-02-18 NOTE — Brief Op Note (Signed)
02/18/2019  2:35 PM  PATIENT:  Wesley [name]  63 y.o. male  PRE-OPERATIVE DIAGNOSIS:  LEFT RENAL PELVIS CANCER  POST-OPERATIVE DIAGNOSIS:  LEFT RENAL PELVIS CANCER  PROCEDURE:  Procedure(s): CYSTOSCOPY/RETROGRADE PYELOGRAM/URETEROSCOPY/HOLMIUM LASER/STENT PLACEMENT (Left)  SURGEON:  Surgeon(s) and Role:    Alexis Frock, MD - Primary  PHYSICIAN ASSISTANT:   ASSISTANTS: none   ANESTHESIA:   general  EBL:  minimal   BLOOD ADMINISTERED:none  DRAINS: none   LOCAL MEDICATIONS USED:  NONE  SPECIMEN:  No Specimen  DISPOSITION OF SPECIMEN:  N/A  COUNTS:  YES  TOURNIQUET:  * No tourniquets in log *  DICTATION: .Other Dictation: Dictation Number [id number]  PLAN OF CARE: Discharge to home after PACU  PATIENT DISPOSITION:  PACU - hemodynamically stable.   Delay start of Pharmacological VTE agent (>24hrs) due to surgical blood loss or risk of bleeding: not applicable

## 2019-02-19 NOTE — Op Note (Signed)
NAME: Wesley [name], PERROTT MEDICAL RECORD [id number] ACCOUNT [id number] DATE OF BIRTH:03/02/1956 FACILITY: [facility] LOCATION: [facility] PHYSICIAN:Ashtian Villacis, MD  OPERATIVE REPORT  DATE OF PROCEDURE:  02/18/2019  PREOPERATIVE DIAGNOSIS:  Left renal pelvis cancer.  PROCEDURE: 1.  Second stage left ureteroscopy with laser ablation of tumor. 2.  Left retrograde pyelogram, interpretation. 3.  Exchange of left ureteral stent 5 x 26 Polaris, no tether.  ESTIMATED BLOOD LOSS:  Nil.  COMPLICATIONS:  None.  SPECIMENS:  None.  FINDINGS: 1.  Minimal volume residual left renal pelvis tumor approximately a 8 sq mm. 2.  Complete resolution of all accessible visible papillary tumor within the renal pelvis and ureter following a second-stage laser ablation. 3.  Successful replacement of left ureteral stent proximal in the renal pelvis, distal end in urinary bladder.  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  Fortunately,  biopsy corroborated low-grade disease.  He does have significant medical and surgical comorbidities.  Options were discussed including recommended path of staged ureteroscopic ablation.  He wished to proceed.  He underwent first-stage procedure several  weeks ago, which was endoscopically quite successful.  He presents for a second-stage procedure today.  Informed consent was obtained and placed in the medical record.  PROCEDURE IN DETAIL:  The patient being identified and the procedure being left second-stage ureteroscopy, laser ablation of tumor was confirmed.  Procedure timeout was performed.  Intravenous antibiotics administered.  General anesthesia induced.  The  patient was placed into a low lithotomy position.  A sterile field was created by prepping and draping his penis, perineum  and proximal thighs using iodine.  Cystourethroscopy was performed with a 22-French rigid cystoscope with offset lens.  Inspection  of the anterior and posterior urethra was unremarkable.  Inspection of the urinary bladder revealed the distal end of the ureteral stent in situ.  It was grasped and brought out to the level of the urethral meatus with a 0.038 ZIPwire.  It was advanced  to the lower pole and exchanged for an open-ended catheter and left retrograde pyelogram was obtained.  Left retrograde pyelogram demonstrated a single left ureter with single-system left kidney.  A 0.038 ZIPwire was advanced to the lower pole and set aside as a safety wire.  An 8-French sheath feeding tube was placed in the urinary bladder for pressure  release, and semirigid ureteroscopy was performed of the distal 1/2 of the left ureter alongside a separate sensor working wire.  No mucosal abnormalities were found.  This semirigid scope was exchanged for a 12/14, 35 cm ureteral access sheath to the  level of the midureter using continuous fluoroscopic guidance, taking care not to pass the sheath more proximal than the previously visualized segment.  Next, flexible digital ureteroscopy was performed with the proximal left ureter, and systematic  inspection of the left kidney, including all calices x2.  In the upper pole, there was a prior ablation site with some shaggy urothelium consistent with ongoing healing and scarring.  There was only 1 small foci in the upper pole infundibulum of residual  papillary tumor.  This appeared to be amenable to laser ablation, and holmium laser energy applied at settings of 1 joule and 10 Hz and using a noncontact technique.  The area of visible papillary tumor was completely ablated down to the superficial  fibromuscular stroma of the inner lining of the kidney.  All calices were once again inspected.  There was excellent hemostasis.  No evidence of renal perforation.  There was complete  resolution of all visible excess of papillary tumor.  The access  sheath was removed under continuous vision.  No significant mucosal abnormalities were found, and a new 5 x 26 Polaris stent was placed using cystoscopic and fluoroscopic guidance.  Good proximal and distal plane was noted.  Bladder was empty per  cystoscope.  Procedure was then terminated.  The patient tolerated the procedure well.  No immediate perioperative complications.  The patient was taken to postanesthesia care in stable condition.  LN/NUANCE  D:02/18/2019 T:02/19/2019 JOB:[id number]

## 2019-02-20 ENCOUNTER — Encounter (HOSPITAL_COMMUNITY): Payer: Self-pay | Admitting: Urology

## 2019-02-21 ENCOUNTER — Encounter (HOSPITAL_COMMUNITY): Payer: Self-pay | Admitting: Emergency Medicine

## 2019-02-21 ENCOUNTER — Emergency Department (HOSPITAL_COMMUNITY): Payer: No Typology Code available for payment source

## 2019-02-21 ENCOUNTER — Inpatient Hospital Stay (HOSPITAL_COMMUNITY)
Admission: EM | Admit: 2019-02-21 | Discharge: 2019-02-28 | DRG: 871 | Disposition: A | Discharge status: Home Health | Payer: No Typology Code available for payment source | Attending: Internal Medicine | Admitting: Internal Medicine

## 2019-02-21 ENCOUNTER — Other Ambulatory Visit: Payer: Self-pay

## 2019-02-21 DIAGNOSIS — F32A Depression, unspecified: Secondary | ICD-10-CM | POA: Diagnosis present

## 2019-02-21 DIAGNOSIS — R6520 Severe sepsis without septic shock: Secondary | ICD-10-CM

## 2019-02-21 DIAGNOSIS — G4733 Obstructive sleep apnea (adult) (pediatric): Secondary | ICD-10-CM | POA: Diagnosis present

## 2019-02-21 DIAGNOSIS — I2510 Atherosclerotic heart disease of native coronary artery without angina pectoris: Secondary | ICD-10-CM | POA: Diagnosis present

## 2019-02-21 DIAGNOSIS — A419 Sepsis, unspecified organism: Secondary | ICD-10-CM | POA: Diagnosis not present

## 2019-02-21 DIAGNOSIS — E1142 Type 2 diabetes mellitus with diabetic polyneuropathy: Secondary | ICD-10-CM | POA: Diagnosis present

## 2019-02-21 DIAGNOSIS — I639 Cerebral infarction, unspecified: Secondary | ICD-10-CM | POA: Diagnosis not present

## 2019-02-21 DIAGNOSIS — Z974 Presence of external hearing-aid: Secondary | ICD-10-CM

## 2019-02-21 DIAGNOSIS — Z961 Presence of intraocular lens: Secondary | ICD-10-CM | POA: Diagnosis present

## 2019-02-21 DIAGNOSIS — I714 Abdominal aortic aneurysm, without rupture: Secondary | ICD-10-CM | POA: Diagnosis present

## 2019-02-21 DIAGNOSIS — Z87891 Personal history of nicotine dependence: Secondary | ICD-10-CM

## 2019-02-21 DIAGNOSIS — R509 Fever, unspecified: Secondary | ICD-10-CM

## 2019-02-21 DIAGNOSIS — Z8673 Personal history of transient ischemic attack (TIA), and cerebral infarction without residual deficits: Secondary | ICD-10-CM | POA: Diagnosis not present

## 2019-02-21 DIAGNOSIS — F17200 Nicotine dependence, unspecified, uncomplicated: Secondary | ICD-10-CM | POA: Diagnosis present

## 2019-02-21 DIAGNOSIS — I252 Old myocardial infarction: Secondary | ICD-10-CM | POA: Diagnosis not present

## 2019-02-21 DIAGNOSIS — A411 Sepsis due to other specified staphylococcus: Secondary | ICD-10-CM

## 2019-02-21 DIAGNOSIS — Z9842 Cataract extraction status, left eye: Secondary | ICD-10-CM

## 2019-02-21 DIAGNOSIS — F1911 Other psychoactive substance abuse, in remission: Secondary | ICD-10-CM | POA: Diagnosis present

## 2019-02-21 DIAGNOSIS — E118 Type 2 diabetes mellitus with unspecified complications: Secondary | ICD-10-CM | POA: Diagnosis not present

## 2019-02-21 DIAGNOSIS — F329 Major depressive disorder, single episode, unspecified: Secondary | ICD-10-CM | POA: Diagnosis not present

## 2019-02-21 DIAGNOSIS — F17211 Nicotine dependence, cigarettes, in remission: Secondary | ICD-10-CM | POA: Diagnosis not present

## 2019-02-21 DIAGNOSIS — Z83511 Family history of glaucoma: Secondary | ICD-10-CM

## 2019-02-21 DIAGNOSIS — F419 Anxiety disorder, unspecified: Secondary | ICD-10-CM | POA: Diagnosis present

## 2019-02-21 DIAGNOSIS — Z950 Presence of cardiac pacemaker: Secondary | ICD-10-CM | POA: Diagnosis not present

## 2019-02-21 DIAGNOSIS — E119 Type 2 diabetes mellitus without complications: Secondary | ICD-10-CM | POA: Diagnosis present

## 2019-02-21 DIAGNOSIS — I495 Sick sinus syndrome: Secondary | ICD-10-CM | POA: Diagnosis present

## 2019-02-21 DIAGNOSIS — R7881 Bacteremia: Secondary | ICD-10-CM

## 2019-02-21 DIAGNOSIS — Z6827 Body mass index (BMI) 27.0-27.9, adult: Secondary | ICD-10-CM

## 2019-02-21 DIAGNOSIS — K5900 Constipation, unspecified: Secondary | ICD-10-CM | POA: Diagnosis present

## 2019-02-21 DIAGNOSIS — Z794 Long term (current) use of insulin: Secondary | ICD-10-CM

## 2019-02-21 DIAGNOSIS — E1169 Type 2 diabetes mellitus with other specified complication: Secondary | ICD-10-CM | POA: Diagnosis present

## 2019-02-21 DIAGNOSIS — E1151 Type 2 diabetes mellitus with diabetic peripheral angiopathy without gangrene: Secondary | ICD-10-CM | POA: Diagnosis present

## 2019-02-21 DIAGNOSIS — E1165 Type 2 diabetes mellitus with hyperglycemia: Secondary | ICD-10-CM | POA: Diagnosis present

## 2019-02-21 DIAGNOSIS — N151 Renal and perinephric abscess: Secondary | ICD-10-CM | POA: Diagnosis present

## 2019-02-21 DIAGNOSIS — G9341 Metabolic encephalopathy: Secondary | ICD-10-CM | POA: Diagnosis present

## 2019-02-21 DIAGNOSIS — Z1630 Resistance to unspecified antimicrobial drugs: Secondary | ICD-10-CM | POA: Diagnosis present

## 2019-02-21 DIAGNOSIS — E669 Obesity, unspecified: Secondary | ICD-10-CM | POA: Diagnosis not present

## 2019-02-21 DIAGNOSIS — R569 Unspecified convulsions: Secondary | ICD-10-CM | POA: Diagnosis present

## 2019-02-21 DIAGNOSIS — Z960 Presence of urogenital implants: Secondary | ICD-10-CM | POA: Diagnosis not present

## 2019-02-21 DIAGNOSIS — C652 Malignant neoplasm of left renal pelvis: Secondary | ICD-10-CM | POA: Diagnosis present

## 2019-02-21 DIAGNOSIS — E785 Hyperlipidemia, unspecified: Secondary | ICD-10-CM | POA: Diagnosis present

## 2019-02-21 DIAGNOSIS — Z8603 Personal history of neoplasm of uncertain behavior: Secondary | ICD-10-CM | POA: Diagnosis not present

## 2019-02-21 DIAGNOSIS — I69354 Hemiplegia and hemiparesis following cerebral infarction affecting left non-dominant side: Secondary | ICD-10-CM | POA: Diagnosis not present

## 2019-02-21 DIAGNOSIS — Z79899 Other long term (current) drug therapy: Secondary | ICD-10-CM

## 2019-02-21 DIAGNOSIS — Z7951 Long term (current) use of inhaled steroids: Secondary | ICD-10-CM

## 2019-02-21 DIAGNOSIS — J439 Emphysema, unspecified: Secondary | ICD-10-CM | POA: Diagnosis present

## 2019-02-21 DIAGNOSIS — D49512 Neoplasm of unspecified behavior of left kidney: Secondary | ICD-10-CM | POA: Diagnosis not present

## 2019-02-21 DIAGNOSIS — Z880 Allergy status to penicillin: Secondary | ICD-10-CM | POA: Diagnosis not present

## 2019-02-21 DIAGNOSIS — J449 Chronic obstructive pulmonary disease, unspecified: Secondary | ICD-10-CM | POA: Diagnosis not present

## 2019-02-21 DIAGNOSIS — N10 Acute tubulo-interstitial nephritis: Secondary | ICD-10-CM | POA: Diagnosis present

## 2019-02-21 DIAGNOSIS — B9562 Methicillin resistant Staphylococcus aureus infection as the cause of diseases classified elsewhere: Secondary | ICD-10-CM | POA: Diagnosis not present

## 2019-02-21 DIAGNOSIS — N400 Enlarged prostate without lower urinary tract symptoms: Secondary | ICD-10-CM | POA: Diagnosis present

## 2019-02-21 DIAGNOSIS — Z801 Family history of malignant neoplasm of trachea, bronchus and lung: Secondary | ICD-10-CM

## 2019-02-21 DIAGNOSIS — K219 Gastro-esophageal reflux disease without esophagitis: Secondary | ICD-10-CM | POA: Diagnosis present

## 2019-02-21 DIAGNOSIS — Z885 Allergy status to narcotic agent status: Secondary | ICD-10-CM | POA: Diagnosis not present

## 2019-02-21 DIAGNOSIS — F0390 Unspecified dementia without behavioral disturbance: Secondary | ICD-10-CM | POA: Diagnosis present

## 2019-02-21 DIAGNOSIS — Z9841 Cataract extraction status, right eye: Secondary | ICD-10-CM

## 2019-02-21 DIAGNOSIS — I69392 Facial weakness following cerebral infarction: Secondary | ICD-10-CM | POA: Diagnosis not present

## 2019-02-21 DIAGNOSIS — Z8611 Personal history of tuberculosis: Secondary | ICD-10-CM

## 2019-02-21 DIAGNOSIS — Z87440 Personal history of urinary (tract) infections: Secondary | ICD-10-CM

## 2019-02-21 DIAGNOSIS — R410 Disorientation, unspecified: Secondary | ICD-10-CM

## 2019-02-21 DIAGNOSIS — Z881 Allergy status to other antibiotic agents status: Secondary | ICD-10-CM

## 2019-02-21 DIAGNOSIS — I10 Essential (primary) hypertension: Secondary | ICD-10-CM | POA: Diagnosis present

## 2019-02-21 DIAGNOSIS — Z791 Long term (current) use of non-steroidal anti-inflammatories (NSAID): Secondary | ICD-10-CM

## 2019-02-21 DIAGNOSIS — I361 Nonrheumatic tricuspid (valve) insufficiency: Secondary | ICD-10-CM | POA: Diagnosis not present

## 2019-02-21 DIAGNOSIS — Z7982 Long term (current) use of aspirin: Secondary | ICD-10-CM

## 2019-02-21 DIAGNOSIS — G250 Essential tremor: Secondary | ICD-10-CM | POA: Diagnosis present

## 2019-02-21 HISTORY — DX: Malignant neoplasm of left renal pelvis: C65.2

## 2019-02-21 LAB — URINALYSIS, ROUTINE W REFLEX MICROSCOPIC
BILIRUBIN URINE: NEGATIVE
Glucose, UA: >=500 mg/dL — AB
Ketones, ur: 5 mg/dL — AB
Nitrite: NEGATIVE
Protein, ur: 100 mg/dL — AB
RBC / HPF: >50 RBC/hpf — ABNORMAL HIGH (ref 0–5)
Specific Gravity, Urine: 1.025 (ref 1.005–1.030)
WBC, UA: >50 WBC/hpf — ABNORMAL HIGH (ref 0–5)
pH: 7 (ref 5.0–8.0)

## 2019-02-21 LAB — PROTIME-INR
INR: 1 (ref 0.8–1.2)
Prothrombin Time: 13.4 seconds (ref 11.4–15.2)

## 2019-02-21 LAB — COMPREHENSIVE METABOLIC PANEL
ALT: 25 U/L (ref 0–44)
AST: 10 U/L — ABNORMAL LOW (ref 15–41)
Albumin: 4 g/dL (ref 3.5–5.0)
Alkaline Phosphatase: 137 U/L — ABNORMAL HIGH (ref 38–126)
Anion gap: 10 (ref 5–15)
BILIRUBIN TOTAL: 1 mg/dL (ref 0.3–1.2)
BUN: 16 mg/dL (ref 8–23)
CO2: 25 mmol/L (ref 22–32)
Calcium: 9.1 mg/dL (ref 8.9–10.3)
Chloride: 102 mmol/L (ref 98–111)
Creatinine, Ser: 1.04 mg/dL (ref 0.61–1.24)
GFR calc Af Amer: >60 mL/min (ref >60)
GFR calc non Af Amer: >60 mL/min (ref >60)
GLUCOSE: 286 mg/dL — AB (ref 70–99)
Potassium: 3.7 mmol/L (ref 3.5–5.1)
Sodium: 137 mmol/L (ref 135–145)
TOTAL PROTEIN: 7.6 g/dL (ref 6.5–8.1)

## 2019-02-21 LAB — CBC WITH DIFFERENTIAL/PLATELET
Abs Immature Granulocytes: 0.07 10*3/uL (ref 0.00–0.07)
Basophils Absolute: 0 10*3/uL (ref 0.0–0.1)
Basophils Relative: 0 %
Eosinophils Absolute: 0 10*3/uL (ref 0.0–0.5)
Eosinophils Relative: 0 %
HCT: 42.8 % (ref 39.0–52.0)
Hemoglobin: 14 g/dL (ref 13.0–17.0)
Immature Granulocytes: 1 %
Lymphocytes Relative: 17 %
Lymphs Abs: 2 10*3/uL (ref 0.7–4.0)
MCH: 28.2 pg (ref 26.0–34.0)
MCHC: 32.7 g/dL (ref 30.0–36.0)
MCV: 86.1 fL (ref 80.0–100.0)
MONO ABS: 1.1 10*3/uL — AB (ref 0.1–1.0)
Monocytes Relative: 10 %
Neutro Abs: 8.3 10*3/uL — ABNORMAL HIGH (ref 1.7–7.7)
Neutrophils Relative %: 72 %
Platelets: 205 10*3/uL (ref 150–400)
RBC: 4.97 MIL/uL (ref 4.22–5.81)
RDW: 13.9 % (ref 11.5–15.5)
WBC: 11.6 10*3/uL — ABNORMAL HIGH (ref 4.0–10.5)
nRBC: 0 % (ref 0.0–0.2)

## 2019-02-21 LAB — INFLUENZA PANEL BY PCR (TYPE A & B)
Influenza A By PCR: NEGATIVE
Influenza B By PCR: NEGATIVE

## 2019-02-21 LAB — GLUCOSE, CAPILLARY: Glucose-Capillary: 155 mg/dL — ABNORMAL HIGH (ref 70–99)

## 2019-02-21 LAB — LACTIC ACID, PLASMA
Lactic Acid, Venous: 1.1 mmol/L (ref 0.5–1.9)
Lactic Acid, Venous: 1 mmol/L (ref 0.5–1.9)

## 2019-02-21 LAB — LIPASE, BLOOD: Lipase: 27 U/L (ref 11–51)

## 2019-02-21 MED ORDER — METRONIDAZOLE IN NACL 5-0.79 MG/ML-% IV SOLN
500.0000 mg | Freq: Three times a day (TID) | INTRAVENOUS | Status: DC
  Administered 2019-02-21 – 2019-02-22 (×4): 500 mg via INTRAVENOUS
  Filled 2019-02-21 (×4): qty 100

## 2019-02-21 MED ORDER — SODIUM CHLORIDE 0.9% FLUSH
3.0000 mL | Freq: Once | INTRAVENOUS | Status: AC
  Administered 2019-02-21: 3 mL via INTRAVENOUS

## 2019-02-21 MED ORDER — ENSURE ENLIVE PO LIQD: 237.0000 mL | Freq: Every day | ORAL | Status: DC | PRN

## 2019-02-21 MED ORDER — MIRABEGRON ER 25 MG PO TB24
25.0000 mg | ORAL_TABLET | Freq: Every day | ORAL | Status: DC
  Administered 2019-02-21 – 2019-02-28 (×8): 25 mg via ORAL
  Filled 2019-02-21 (×8): qty 1

## 2019-02-21 MED ORDER — ENSURE PLUS PO LIQD: 237.0000 mL | ORAL | Status: DC

## 2019-02-21 MED ORDER — ONDANSETRON HCL 4 MG/2ML IJ SOLN
4.0000 mg | Freq: Once | INTRAMUSCULAR | Status: AC
  Administered 2019-02-21: 4 mg via INTRAVENOUS
  Filled 2019-02-21: qty 2

## 2019-02-21 MED ORDER — OXYCODONE-ACETAMINOPHEN 5-325 MG PO TABS
1.0000 | ORAL_TABLET | Freq: Four times a day (QID) | ORAL | Status: DC | PRN
  Administered 2019-02-21 – 2019-02-23 (×6): 2 via ORAL
  Filled 2019-02-21 (×6): qty 2

## 2019-02-21 MED ORDER — LEVETIRACETAM 500 MG PO TABS
750.0000 mg | ORAL_TABLET | Freq: Two times a day (BID) | ORAL | Status: DC
  Administered 2019-02-21 – 2019-02-28 (×16): 750 mg via ORAL
  Filled 2019-02-21 (×16): qty 1

## 2019-02-21 MED ORDER — DILTIAZEM HCL ER 180 MG PO CP24: 180.0000 mg | ORAL_CAPSULE | Freq: Every day | ORAL | Status: DC

## 2019-02-21 MED ORDER — BISACODYL 10 MG RE SUPP: 10.0000 mg | RECTAL | Status: DC | PRN

## 2019-02-21 MED ORDER — BUDESONIDE 0.25 MG/2ML IN SUSP: 0.2500 mg | Freq: Two times a day (BID) | RESPIRATORY_TRACT | Status: DC

## 2019-02-21 MED ORDER — SODIUM CHLORIDE 0.9 % IV SOLN
INTRAVENOUS | Status: DC
  Administered 2019-02-21 – 2019-02-28 (×6): via INTRAVENOUS

## 2019-02-21 MED ORDER — ALBUTEROL SULFATE HFA 108 (90 BASE) MCG/ACT IN AERS: 2.0000 | INHALATION_SPRAY | Freq: Four times a day (QID) | RESPIRATORY_TRACT | Status: DC | PRN

## 2019-02-21 MED ORDER — BRIMONIDINE TARTRATE 0.2 % OP SOLN
1.0000 [drp] | Freq: Every day | OPHTHALMIC | Status: DC
  Administered 2019-02-21 – 2019-02-28 (×8): 1 [drp] via OPHTHALMIC
  Filled 2019-02-21: qty 5

## 2019-02-21 MED ORDER — PANTOPRAZOLE SODIUM 40 MG PO TBEC
40.0000 mg | DELAYED_RELEASE_TABLET | Freq: Two times a day (BID) | ORAL | Status: DC
  Administered 2019-02-21 – 2019-02-28 (×15): 40 mg via ORAL
  Filled 2019-02-21 (×15): qty 1

## 2019-02-21 MED ORDER — POLYETHYLENE GLYCOL 3350 17 G PO PACK
17.0000 g | PACK | Freq: Every day | ORAL | Status: DC
  Administered 2019-02-22 – 2019-02-27 (×6): 17 g via ORAL
  Filled 2019-02-21 (×7): qty 1

## 2019-02-21 MED ORDER — IOPAMIDOL (ISOVUE-300) INJECTION 61%
100.0000 mL | Freq: Once | INTRAVENOUS | Status: AC | PRN
  Administered 2019-02-21: 100 mL via INTRAVENOUS

## 2019-02-21 MED ORDER — FLUTICASONE PROPIONATE 50 MCG/ACT NA SUSP
2.0000 | Freq: Every day | NASAL | Status: DC | PRN
  Filled 2019-02-21: qty 16

## 2019-02-21 MED ORDER — LURASIDONE HCL 20 MG PO TABS
20.0000 mg | ORAL_TABLET | Freq: Every day | ORAL | Status: DC
  Administered 2019-02-21 – 2019-02-28 (×8): 20 mg via ORAL
  Filled 2019-02-21 (×8): qty 1

## 2019-02-21 MED ORDER — DILTIAZEM HCL ER COATED BEADS 180 MG PO CP24
180.0000 mg | ORAL_CAPSULE | Freq: Every day | ORAL | Status: DC
  Administered 2019-02-21 – 2019-02-28 (×8): 180 mg via ORAL
  Filled 2019-02-21 (×9): qty 1

## 2019-02-21 MED ORDER — VANCOMYCIN HCL 10 G IV SOLR
2000.0000 mg | Freq: Once | INTRAVENOUS | Status: AC
  Administered 2019-02-21: 2000 mg via INTRAVENOUS
  Filled 2019-02-21: qty 2000

## 2019-02-21 MED ORDER — SODIUM CHLORIDE 0.9 % IV SOLN
2.0000 g | Freq: Three times a day (TID) | INTRAVENOUS | Status: DC
  Administered 2019-02-21 – 2019-02-23 (×6): 2 g via INTRAVENOUS
  Filled 2019-02-21 (×6): qty 2

## 2019-02-21 MED ORDER — SENNA 8.6 MG PO TABS
1.0000 | ORAL_TABLET | Freq: Every day | ORAL | Status: DC
  Administered 2019-02-21 – 2019-02-23 (×3): 8.6 mg via ORAL
  Filled 2019-02-21 (×3): qty 1

## 2019-02-21 MED ORDER — BUDESONIDE 0.25 MG/2ML IN SUSP
0.2500 mg | Freq: Two times a day (BID) | RESPIRATORY_TRACT | Status: DC
  Administered 2019-02-21 – 2019-02-28 (×15): 0.25 mg via RESPIRATORY_TRACT
  Filled 2019-02-21 (×15): qty 2

## 2019-02-21 MED ORDER — ENSURE ENLIVE PO LIQD
237.0000 mL | ORAL | Status: DC
  Administered 2019-02-22 – 2019-02-26 (×5): 237 mL via ORAL

## 2019-02-21 MED ORDER — CYCLOBENZAPRINE HCL 10 MG PO TABS: 10.0000 mg | ORAL_TABLET | Freq: Every evening | ORAL | Status: DC | PRN

## 2019-02-21 MED ORDER — DICLOFENAC SODIUM 1 % TD GEL
4.0000 g | Freq: Four times a day (QID) | TRANSDERMAL | Status: DC | PRN
  Filled 2019-02-21: qty 100

## 2019-02-21 MED ORDER — GABAPENTIN 300 MG PO CAPS
300.0000 mg | ORAL_CAPSULE | Freq: Three times a day (TID) | ORAL | Status: DC
  Administered 2019-02-21 – 2019-02-28 (×22): 300 mg via ORAL
  Filled 2019-02-21 (×22): qty 1

## 2019-02-21 MED ORDER — ALBUTEROL SULFATE (2.5 MG/3ML) 0.083% IN NEBU
2.5000 mg | INHALATION_SOLUTION | Freq: Two times a day (BID) | RESPIRATORY_TRACT | Status: DC
  Administered 2019-02-21 – 2019-02-28 (×15): 2.5 mg via RESPIRATORY_TRACT
  Filled 2019-02-21 (×15): qty 3

## 2019-02-21 MED ORDER — CARBOXYMETHYLCELLULOSE SODIUM 0.5 % OP SOLN: 1.0000 [drp] | Freq: Every day | OPHTHALMIC | Status: DC

## 2019-02-21 MED ORDER — IOPAMIDOL (ISOVUE-300) INJECTION 61%
INTRAVENOUS | Status: AC
  Filled 2019-02-21: qty 100

## 2019-02-21 MED ORDER — SODIUM CHLORIDE 0.9 % IV SOLN
2.0000 g | INTRAVENOUS | Status: AC
  Administered 2019-02-21: 2 g via INTRAVENOUS
  Filled 2019-02-21: qty 2

## 2019-02-21 MED ORDER — ATORVASTATIN CALCIUM 40 MG PO TABS
40.0000 mg | ORAL_TABLET | Freq: Every day | ORAL | Status: DC
  Administered 2019-02-21 – 2019-02-28 (×8): 40 mg via ORAL
  Filled 2019-02-21 (×8): qty 1

## 2019-02-21 MED ORDER — MORPHINE SULFATE (PF) 4 MG/ML IV SOLN
4.0000 mg | Freq: Once | INTRAVENOUS | Status: AC
  Administered 2019-02-21: 4 mg via INTRAVENOUS
  Filled 2019-02-21: qty 1

## 2019-02-21 MED ORDER — ACETAMINOPHEN 325 MG PO TABS
650.0000 mg | ORAL_TABLET | Freq: Four times a day (QID) | ORAL | Status: DC | PRN
  Administered 2019-02-21: 650 mg via ORAL
  Filled 2019-02-21: qty 2

## 2019-02-21 MED ORDER — MIRTAZAPINE 15 MG PO TABS
7.5000 mg | ORAL_TABLET | Freq: Every day | ORAL | Status: DC
  Administered 2019-02-21 – 2019-02-28 (×8): 7.5 mg via ORAL
  Filled 2019-02-21 (×8): qty 1

## 2019-02-21 MED ORDER — MELOXICAM 15 MG PO TABS
15.0000 mg | ORAL_TABLET | Freq: Every day | ORAL | Status: DC
  Administered 2019-02-21 – 2019-02-22 (×2): 15 mg via ORAL
  Filled 2019-02-21 (×3): qty 1

## 2019-02-21 MED ORDER — ONDANSETRON HCL 4 MG/2ML IJ SOLN: 4.0000 mg | Freq: Four times a day (QID) | INTRAMUSCULAR | Status: DC | PRN

## 2019-02-21 MED ORDER — AZTREONAM 2 G IJ SOLR: 2.0000 g | Freq: Three times a day (TID) | INTRAMUSCULAR | Status: DC

## 2019-02-21 MED ORDER — ALBUTEROL SULFATE (2.5 MG/3ML) 0.083% IN NEBU: 2.5000 mg | INHALATION_SOLUTION | Freq: Two times a day (BID) | RESPIRATORY_TRACT | Status: DC

## 2019-02-21 MED ORDER — INSULIN GLARGINE 100 UNIT/ML ~~LOC~~ SOLN
33.0000 [IU] | Freq: Every day | SUBCUTANEOUS | Status: DC
  Administered 2019-02-21 – 2019-02-28 (×8): 33 [IU] via SUBCUTANEOUS
  Filled 2019-02-21 (×8): qty 0.33

## 2019-02-21 MED ORDER — POLYVINYL ALCOHOL 1.4 % OP SOLN
1.0000 [drp] | Freq: Every day | OPHTHALMIC | Status: DC
  Administered 2019-02-21 – 2019-02-28 (×8): 1 [drp] via OPHTHALMIC
  Filled 2019-02-21: qty 15

## 2019-02-21 MED ORDER — ONDANSETRON HCL 4 MG PO TABS: 4.0000 mg | ORAL_TABLET | Freq: Four times a day (QID) | ORAL | Status: DC | PRN

## 2019-02-21 MED ORDER — VENLAFAXINE HCL ER 150 MG PO CP24
150.0000 mg | ORAL_CAPSULE | Freq: Every day | ORAL | Status: DC
  Administered 2019-02-21 – 2019-02-28 (×8): 150 mg via ORAL
  Filled 2019-02-21 (×8): qty 1

## 2019-02-21 MED ORDER — POLYETHYLENE GLYCOL 3350 17 G PO PACK: 17.0000 g | PACK | Freq: Every day | ORAL | Status: DC | PRN

## 2019-02-21 MED ORDER — SODIUM CHLORIDE 0.9 % IV BOLUS
1000.0000 mL | Freq: Once | INTRAVENOUS | Status: AC
  Administered 2019-02-21: 1000 mL via INTRAVENOUS

## 2019-02-21 MED ORDER — NICOTINE 14 MG/24HR TD PT24
14.0000 mg | MEDICATED_PATCH | Freq: Every day | TRANSDERMAL | Status: DC
  Administered 2019-02-21 – 2019-02-28 (×8): 14 mg via TRANSDERMAL
  Filled 2019-02-21 (×8): qty 1

## 2019-02-21 MED ORDER — ALBUTEROL SULFATE (2.5 MG/3ML) 0.083% IN NEBU: 2.5000 mg | INHALATION_SOLUTION | Freq: Four times a day (QID) | RESPIRATORY_TRACT | Status: DC | PRN

## 2019-02-21 MED ORDER — LATANOPROST 0.005 % OP SOLN
1.0000 [drp] | Freq: Every day | OPHTHALMIC | Status: DC
  Administered 2019-02-21 – 2019-02-28 (×8): 1 [drp] via OPHTHALMIC
  Filled 2019-02-21: qty 2.5

## 2019-02-21 MED ORDER — ACETAMINOPHEN 650 MG RE SUPP
650.0000 mg | Freq: Four times a day (QID) | RECTAL | Status: DC | PRN
  Administered 2019-02-21: 650 mg via RECTAL
  Filled 2019-02-21: qty 1

## 2019-02-21 MED ORDER — ACETAMINOPHEN 325 MG PO TABS
650.0000 mg | ORAL_TABLET | Freq: Once | ORAL | Status: AC
  Administered 2019-02-21: 650 mg via ORAL
  Filled 2019-02-21: qty 2

## 2019-02-21 MED ORDER — SORBITOL 70 % SOLN
30.0000 mL | Freq: Every day | Status: DC | PRN
  Filled 2019-02-21: qty 30

## 2019-02-21 MED ORDER — SODIUM CHLORIDE (PF) 0.9 % IJ SOLN
INTRAMUSCULAR | Status: AC
  Filled 2019-02-21: qty 50

## 2019-02-21 MED ORDER — FINASTERIDE 5 MG PO TABS
5.0000 mg | ORAL_TABLET | Freq: Every day | ORAL | Status: DC
  Administered 2019-02-21 – 2019-02-28 (×8): 5 mg via ORAL
  Filled 2019-02-21 (×8): qty 1

## 2019-02-21 NOTE — ED Notes (Signed)
Dr.Molpus at bedside after being made aware of poss code sepsis.

## 2019-02-21 NOTE — ED Notes (Signed)
Shawn, Pa at bedside.  

## 2019-02-21 NOTE — Progress Notes (Signed)
A consult was received from an ED physician for vancomycin and aztreonam per pharmacy dosing.  The patient's profile has been reviewed for ht/wt/allergies/indication/available labs.    Pt has PCN allergy (reaction: general swelling).  A one time order has been placed for vancomycin 2000 mg IV once and aztreonam 2 g IV once.    Further antibiotics/pharmacy consults should be ordered by admitting physician if indicated.                       Thank you, Lenis Noon, PharmD 02/21/2019  7:01 AM
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
## 2019-02-21 NOTE — ED Triage Notes (Signed)
Pt presents with spouse for evaluation of slurred speech, fever, and frequent urination.

## 2019-02-21 NOTE — ED Notes (Signed)
Report given to Kimberly, RN.

## 2019-02-21 NOTE — ED Notes (Signed)
Patient transported to CT 

## 2019-02-21 NOTE — ED Provider Notes (Signed)
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  Clinical Course as of Feb 20 1025  Mon Feb 21, 2019  0756 Patient reassessed.  He seems to be more oriented.  He now complains of left-sided abdominal pain beginning yesterday, severe, vague description.  Adds he has had multiple episodes of nonbilious nonbloody emesis beginning yesterday.  He has not had a bowel movement in 2 to 3 days, but continues to have flatus. He also notes cough, nasal congestion, and rhinorrhea.   [SJ]  0850 Pain has improved. Now rates his abdominal pain 5/10.   [SJ]  [id number] Spoke with Dr. Jeffie Pollock, urologist.  Recommends placing a Foley catheter.  Admit via  hospitalist.  He will come see the patient later this morning and decide on next steps.   [SJ]  0945 SPO2 noted to be 96% on room air with good waveform.  SpO2: 90 % [SJ]  1013 Spoke with hospitalist. Agrees to admit the patient.   [SJ]    Clinical Course User Index [SJ] Mong Neal C, PA-C       Patient presents with fever and confusion.  Mental status improved with fever control and IV fluids.  Suspect the source is urinary, but coverage was broadened early in the ED course will be explored other possible sources. Febrile, tachypneic, and mild leukocytosis; code sepsis initiated.  No lactic acidosis or hypotension.  Influenza negative.  Chest x-ray without acute abnormality. UA shows large IMA globin, large leukocytes, rare bacteria, and mild ketonuria.  CT abdomen/pelvis shows left side perinephric fluid collection. Urology will come evaluate the patient.  Patient admitted via hospitalist.  Findings and plan of care discussed with Dr. Florina Ou. Dr. Florina Ou personally evaluated and examined this patient.  Vitals:   02/21/19 0800 02/21/19 0849 02/21/19 0857 02/21/19 0938  BP: (!) 155/94  133/78 (!) 155/100  Pulse: 85  84 95  Resp: (!) 23  17 (!) 21  Temp:  (!) 100.9 F (38.3 C)    TempSrc:  Rectal    SpO2: 94%  (!) 88% 90%  Weight:      Height:         Final Clinical Impressions(s) / ED Diagnoses   Final diagnoses:  Fever, unspecified fever cause  Confusion    ED Discharge Orders    None       Layla Maw 02/21/19 1026    Molpus, John, MD 02/21/19 2241

## 2019-02-22 DIAGNOSIS — F17200 Nicotine dependence, unspecified, uncomplicated: Secondary | ICD-10-CM

## 2019-02-22 DIAGNOSIS — J449 Chronic obstructive pulmonary disease, unspecified: Secondary | ICD-10-CM

## 2019-02-22 DIAGNOSIS — Z8673 Personal history of transient ischemic attack (TIA), and cerebral infarction without residual deficits: Secondary | ICD-10-CM

## 2019-02-22 DIAGNOSIS — I10 Essential (primary) hypertension: Secondary | ICD-10-CM

## 2019-02-22 DIAGNOSIS — F329 Major depressive disorder, single episode, unspecified: Secondary | ICD-10-CM

## 2019-02-22 DIAGNOSIS — F419 Anxiety disorder, unspecified: Secondary | ICD-10-CM

## 2019-02-22 DIAGNOSIS — N151 Renal and perinephric abscess: Secondary | ICD-10-CM

## 2019-02-22 DIAGNOSIS — R6520 Severe sepsis without septic shock: Secondary | ICD-10-CM

## 2019-02-22 DIAGNOSIS — N10 Acute tubulo-interstitial nephritis: Secondary | ICD-10-CM

## 2019-02-22 DIAGNOSIS — N400 Enlarged prostate without lower urinary tract symptoms: Secondary | ICD-10-CM

## 2019-02-22 DIAGNOSIS — A419 Sepsis, unspecified organism: Secondary | ICD-10-CM

## 2019-02-22 DIAGNOSIS — G4733 Obstructive sleep apnea (adult) (pediatric): Secondary | ICD-10-CM

## 2019-02-22 LAB — BLOOD CULTURE ID PANEL (REFLEXED)
Acinetobacter baumannii: NOT DETECTED
Candida albicans: NOT DETECTED
Candida glabrata: NOT DETECTED
Candida krusei: NOT DETECTED
Candida parapsilosis: NOT DETECTED
Candida tropicalis: NOT DETECTED
ENTEROCOCCUS SPECIES: NOT DETECTED
ESCHERICHIA COLI: NOT DETECTED
Enterobacter cloacae complex: NOT DETECTED
Enterobacteriaceae species: NOT DETECTED
Haemophilus influenzae: NOT DETECTED
Klebsiella oxytoca: NOT DETECTED
Klebsiella pneumoniae: NOT DETECTED
Listeria monocytogenes: NOT DETECTED
Methicillin resistance: DETECTED — AB
Neisseria meningitidis: NOT DETECTED
Proteus species: NOT DETECTED
Pseudomonas aeruginosa: NOT DETECTED
Serratia marcescens: NOT DETECTED
Staphylococcus aureus (BCID): NOT DETECTED
Staphylococcus species: DETECTED — AB
Streptococcus agalactiae: NOT DETECTED
Streptococcus pneumoniae: NOT DETECTED
Streptococcus pyogenes: NOT DETECTED
Streptococcus species: NOT DETECTED

## 2019-02-22 LAB — CBC
HCT: 38.9 % — ABNORMAL LOW (ref 39.0–52.0)
Hemoglobin: 11.6 g/dL — ABNORMAL LOW (ref 13.0–17.0)
MCH: 26.5 pg (ref 26.0–34.0)
MCHC: 29.8 g/dL — ABNORMAL LOW (ref 30.0–36.0)
MCV: 88.8 fL (ref 80.0–100.0)
NRBC: 0 % (ref 0.0–0.2)
PLATELETS: 177 10*3/uL (ref 150–400)
RBC: 4.38 MIL/uL (ref 4.22–5.81)
RDW: 13.8 % (ref 11.5–15.5)
WBC: 11.6 10*3/uL — ABNORMAL HIGH (ref 4.0–10.5)

## 2019-02-22 LAB — BASIC METABOLIC PANEL
Anion gap: 9 (ref 5–15)
BUN: 18 mg/dL (ref 8–23)
CALCIUM: 8 mg/dL — AB (ref 8.9–10.3)
CO2: 24 mmol/L (ref 22–32)
Chloride: 106 mmol/L (ref 98–111)
Creatinine, Ser: 0.8 mg/dL (ref 0.61–1.24)
GFR calc non Af Amer: >60 mL/min (ref >60)
Glucose, Bld: 166 mg/dL — ABNORMAL HIGH (ref 70–99)
Potassium: 3.7 mmol/L (ref 3.5–5.1)
Sodium: 139 mmol/L (ref 135–145)

## 2019-02-22 LAB — GLUCOSE, CAPILLARY
GLUCOSE-CAPILLARY: 307 mg/dL — AB (ref 70–99)
Glucose-Capillary: 230 mg/dL — ABNORMAL HIGH (ref 70–99)
Glucose-Capillary: 249 mg/dL — ABNORMAL HIGH (ref 70–99)

## 2019-02-22 MED ORDER — ALUM & MAG HYDROXIDE-SIMETH 200-200-20 MG/5ML PO SUSP: 30.0000 mL | ORAL | Status: DC | PRN

## 2019-02-22 MED ORDER — SALINE SPRAY 0.65 % NA SOLN: 1.0000 | NASAL | Status: DC | PRN

## 2019-02-22 MED ORDER — HYDROCORTISONE 2.5 % RE CREA: 1.0000 "application " | TOPICAL_CREAM | Freq: Four times a day (QID) | RECTAL | Status: DC | PRN

## 2019-02-22 MED ORDER — ASPIRIN 325 MG PO TABS
325.0000 mg | ORAL_TABLET | Freq: Every day | ORAL | Status: DC
  Administered 2019-02-22 – 2019-02-28 (×7): 325 mg via ORAL
  Filled 2019-02-22 (×7): qty 1

## 2019-02-22 MED ORDER — ARFORMOTEROL TARTRATE 15 MCG/2ML IN NEBU
15.0000 ug | INHALATION_SOLUTION | Freq: Two times a day (BID) | RESPIRATORY_TRACT | Status: DC
  Administered 2019-02-22 – 2019-02-28 (×13): 15 ug via RESPIRATORY_TRACT
  Filled 2019-02-22 (×12): qty 2

## 2019-02-22 MED ORDER — INSULIN ASPART 100 UNIT/ML ~~LOC~~ SOLN
0.0000 [IU] | Freq: Three times a day (TID) | SUBCUTANEOUS | Status: DC
  Administered 2019-02-22 (×2): 5 [IU] via SUBCUTANEOUS
  Administered 2019-02-23: 3 [IU] via SUBCUTANEOUS

## 2019-02-22 MED ORDER — UMECLIDINIUM BROMIDE 62.5 MCG/INH IN AEPB
1.0000 | INHALATION_SPRAY | Freq: Every day | RESPIRATORY_TRACT | Status: DC
  Administered 2019-02-23 – 2019-02-28 (×5): 1 via RESPIRATORY_TRACT
  Filled 2019-02-22: qty 7

## 2019-02-22 MED ORDER — INSULIN ASPART 100 UNIT/ML ~~LOC~~ SOLN
0.0000 [IU] | Freq: Every day | SUBCUTANEOUS | Status: DC
  Administered 2019-02-22: 4 [IU] via SUBCUTANEOUS

## 2019-02-22 MED ORDER — LORATADINE 10 MG PO TABS: 10.0000 mg | ORAL_TABLET | Freq: Every day | ORAL | Status: DC | PRN

## 2019-02-22 MED ORDER — HYDRALAZINE HCL 20 MG/ML IJ SOLN: 10.0000 mg | INTRAMUSCULAR | Status: DC | PRN

## 2019-02-22 MED ORDER — MUSCLE RUB 10-15 % EX CREA: 1.0000 "application " | TOPICAL_CREAM | CUTANEOUS | Status: DC | PRN

## 2019-02-22 MED ORDER — VANCOMYCIN HCL 10 G IV SOLR
1750.0000 mg | Freq: Two times a day (BID) | INTRAVENOUS | Status: DC
  Administered 2019-02-22 – 2019-02-24 (×6): 1750 mg via INTRAVENOUS
  Filled 2019-02-22 (×8): qty 1750

## 2019-02-22 MED ORDER — LORATADINE 10 MG PO TABS
10.0000 mg | ORAL_TABLET | Freq: Every day | ORAL | Status: DC
  Administered 2019-02-22 – 2019-02-28 (×7): 10 mg via ORAL
  Filled 2019-02-22 (×7): qty 1

## 2019-02-22 MED ORDER — LIP MEDEX EX OINT: 1.0000 "application " | TOPICAL_OINTMENT | CUTANEOUS | Status: DC | PRN

## 2019-02-22 MED ORDER — POLYETHYLENE GLYCOL 3350 17 G PO PACK: 17.0000 g | PACK | Freq: Every day | ORAL | Status: DC | PRN

## 2019-02-22 MED ORDER — VITAMIN D 25 MCG (1000 UNIT) PO TABS
2000.0000 [IU] | ORAL_TABLET | Freq: Every day | ORAL | Status: DC
  Administered 2019-02-22 – 2019-02-28 (×6): 2000 [IU] via ORAL
  Filled 2019-02-22 (×6): qty 2

## 2019-02-22 MED ORDER — POLYVINYL ALCOHOL 1.4 % OP SOLN: 1.0000 [drp] | OPHTHALMIC | Status: DC | PRN

## 2019-02-22 MED ORDER — HYDROCORTISONE 1 % EX CREA: 1.0000 "application " | TOPICAL_CREAM | Freq: Three times a day (TID) | CUTANEOUS | Status: DC | PRN

## 2019-02-22 MED ORDER — SENNOSIDES-DOCUSATE SODIUM 8.6-50 MG PO TABS: 2.0000 | ORAL_TABLET | Freq: Every evening | ORAL | Status: DC | PRN

## 2019-02-22 MED ORDER — PHENOL 1.4 % MT LIQD: 1.0000 | OROMUCOSAL | Status: DC | PRN

## 2019-02-22 MED ORDER — NICOTINE 7 MG/24HR TD PT24: 7.0000 mg | MEDICATED_PATCH | Freq: Every day | TRANSDERMAL | Status: DC

## 2019-02-22 MED ORDER — TAMSULOSIN HCL 0.4 MG PO CAPS
0.4000 mg | ORAL_CAPSULE | Freq: Every day | ORAL | Status: DC
  Administered 2019-02-22 – 2019-02-28 (×7): 0.4 mg via ORAL
  Filled 2019-02-22 (×7): qty 1

## 2019-02-22 MED ORDER — GUAIFENESIN-DM 100-10 MG/5ML PO SYRP: 5.0000 mL | ORAL_SOLUTION | ORAL | Status: DC | PRN

## 2019-02-22 NOTE — Progress Notes (Signed)
PHARMACY - PHYSICIAN COMMUNICATION CRITICAL VALUE ALERT - BLOOD CULTURE IDENTIFICATION (BCID)  Wesley [name] is an 63 y.o. male who presented to [facility] on 02/21/2019 with a chief complaint of pyelonephritis  Assessment: Staph epiderimidis growing in urine and Staph species (not aureus) with methicillin resistance in one bottle of both sets of blood culture  Name of physician (or Provider) ContactedReesa Chew  Current antibiotics: vancomycin and cefepime  Changes to prescribed antibiotics recommended:  Currently on vancomycin, TRH wants to continue cefepime as well for now  Results for orders placed or performed during the hospital encounter of 02/21/19  Blood Culture ID Panel (Reflexed) (Collected: 02/21/2019  6:26 AM)  Result Value Ref Range   Enterococcus species NOT DETECTED NOT DETECTED   Listeria monocytogenes NOT DETECTED NOT DETECTED   Staphylococcus species DETECTED (A) NOT DETECTED   Staphylococcus aureus (BCID) NOT DETECTED NOT DETECTED   Methicillin resistance DETECTED (A) NOT DETECTED   Streptococcus species NOT DETECTED NOT DETECTED   Streptococcus agalactiae NOT DETECTED NOT DETECTED   Streptococcus pneumoniae NOT DETECTED NOT DETECTED   Streptococcus pyogenes NOT DETECTED NOT DETECTED   Acinetobacter baumannii NOT DETECTED NOT DETECTED   Enterobacteriaceae species NOT DETECTED NOT DETECTED   Enterobacter cloacae complex NOT DETECTED NOT DETECTED   Escherichia coli NOT DETECTED NOT DETECTED   Klebsiella oxytoca NOT DETECTED NOT DETECTED   Klebsiella pneumoniae NOT DETECTED NOT DETECTED   Proteus species NOT DETECTED NOT DETECTED   Serratia marcescens NOT DETECTED NOT DETECTED   Haemophilus influenzae NOT DETECTED NOT DETECTED   Neisseria meningitidis NOT DETECTED NOT DETECTED   Pseudomonas aeruginosa NOT DETECTED NOT DETECTED   Candida albicans NOT DETECTED NOT DETECTED   Candida glabrata NOT DETECTED NOT DETECTED   Candida krusei NOT DETECTED NOT DETECTED   Candida parapsilosis NOT DETECTED NOT DETECTED   Candida tropicalis NOT DETECTED NOT DETECTED    Napoleon Form 02/22/2019  4:32 PM

## 2019-02-22 NOTE — Progress Notes (Signed)
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 BX 12/2018 confirms low grade. He underwent 1st stage ablation on 02/03/19, 2nd stage and stent exchange 02/18/19.  2 - Urosepsis - new fevers to 102, confusion, tachycardia 2 days after ureteroscopy for renal pelvis tumor. CT with stent in good position, some perinephric fluid (expected). Lenna Gilford 3/9 Staph / pending. Placed on empiric Vanc + Cefipime. A1c 9's.    Today " Wesley [name] " is improving. Fever curve trending down and much less malaise.    Objective: Vital signs in last 24 hours: Temp:  [99.3 F (37.4 C)-102.1 F (38.9 C)] 100.7 F (38.2 C) (03/10 0657) Pulse Rate:  [65-87] 87 (03/10 0500) Resp:  [18] 18 (03/10 0445) BP: (139-158)/(73-88) 158/82 (03/10 0445) SpO2:  [95 %-98 %] 95 % (03/10 0445) Last BM Date: 02/17/19  Intake/Output from previous day: 03/09 0701 - 03/10 0700 In: 3473.5 [P.O.:90; I.V.:1783.5; IV Piggyback:1600] Out: 1550 [Urine:1550] Intake/Output this shift: Total I/O In: 240 [P.O.:240] Out: 100 [Urine:100]  General appearance: alert, cooperative and at baseline.  Head: Normocephalic, without obvious abnormality, atraumatic Nose: Nares normal. Septum midline. Mucosa normal. No drainage or sinus tenderness. Throat: lips, mucosa, and tongue normal; teeth and gums normal Neck: supple, symmetrical, trachea midline Back: symmetric, no curvature. ROM normal. No CVA tenderness. Resp: non-labored on minimal [place] O2 Cardio: Nl rate at present.  Male genitalia: normal, foley in place with yellow urine that is non-foul.  Extremities: extremities normal, atraumatic, no cyanosis or edema Pulses: 2+ and symmetric Skin: Skin color, texture, turgor normal. No rashes or lesions Lymph nodes: Cervical, supraclavicular, and axillary nodes normal. Neurologic: Grossly normal  Lab Results:  Recent Labs    02/21/19 0626 02/22/19 0554   WBC 11.6* 11.6*  HGB 14.0 11.6*  HCT 42.8 38.9*  PLT 205 177   BMET Recent Labs    02/21/19 0626 02/22/19 0554  NA 137 139  K 3.7 3.7  CL 102 106  CO2 25 24  GLUCOSE 286* 166*  BUN 16 18  CREATININE 1.04 0.80  CALCIUM 9.1 8.0*   PT/INR Recent Labs    02/21/19 0626  LABPROT 13.4  INR 1.0   ABG No results for input(s): PHART, HCO3 in the last 72 hours.  Invalid input(s): PCO2, PO2  Studies/Results: Dg Chest 2 View  Result Date: 02/21/2019 CLINICAL DATA:  Slurred speech, possible sepsis. EXAM: CHEST - 2 VIEW COMPARISON:  Chest radiograph November 23, 2018 FINDINGS: Cardiac silhouette is upper limits of normal. Calcified aortic arch. RIGHT apical bullous changes. Thickening of the RIGHT major fissure. No pleural effusion or focal consolidations. No pneumothorax. Dual lead LEFT cardiac pacemaker in situ. Soft tissue planes and included osseous structures are unchanged. IMPRESSION: 1. Borderline cardiomegaly.  No acute pulmonary process. Electronically Signed   By: Elon Alas M.D.   On: 02/21/2019 06:57   Ct Head Wo Contrast  Result Date: 02/21/2019 CLINICAL DATA:  Slurred speech, fever and frequent urination. EXAM: CT HEAD WITHOUT CONTRAST TECHNIQUE: Contiguous axial images were obtained from the base of the skull through the vertex without intravenous contrast. COMPARISON:  11/23/2018 FINDINGS: Brain: No evidence of acute infarction, hemorrhage, hydrocephalus, extra-axial collection or mass lesion/mass effect. Moderate brain parenchymal volume loss and deep white matter microangiopathy. Vascular: Calcific atherosclerotic disease of the intra cavernous carotid arteries. Skull: Normal. Negative for fracture or focal lesion. Sinuses/Orbits: Mucosal thickening of the left ethmoid sinus and  left nasal mucosal passages. Other: None. IMPRESSION: 1. No acute intracranial abnormality. 2. Atrophy, chronic microvascular disease. Electronically Signed   By: Fidela Salisbury M.D.   On:  02/21/2019 08:50   Ct Abdomen Pelvis W Contrast  Result Date: 02/21/2019 CLINICAL DATA:  63 year old male with a history of urothelial carcinoma now 3 days status post left ureteroscopy with laser ablation of calyceal tumor and placement of a left ureteral stent. Patient presents with slurred speech, fever and urinary frequency EXAM: CT ABDOMEN AND PELVIS WITH CONTRAST TECHNIQUE: Multidetector CT imaging of the abdomen and pelvis was performed using the standard protocol following bolus administration of intravenous contrast. CONTRAST:  185mL ISOVUE-300 IOPAMIDOL (ISOVUE-300) INJECTION 61% COMPARISON:  Intraoperative images [id number]; prior CT scan of the abdomen and pelvis 11/23/2018 FINDINGS: Lower chest: Minimal dependent atelectasis. No evidence of pneumonia or pleural effusion. Incompletely imaged cardiac rhythm maintenance device with leads in the right atrium and right ventricle. Calcifications visualized along the coronary arteries. The heart is normal in size. No pericardial effusion. Unremarkable distal thoracic esophagus. Hepatobiliary: Diffuse low attenuation of the hepatic parenchyma with relative sparing at the margin of the gallbladder most consistent with hepatic steatosis. No cirrhotic change or solid lesion visualized. Gallbladder is unremarkable. No intra or extrahepatic biliary ductal dilatation. Pancreas: Unremarkable. No pancreatic ductal dilatation or surrounding inflammatory changes. Spleen: Punctate calcification within the mid splenic parenchyma consistent with old granulomatous disease. Adrenals/Urinary Tract: The adrenal glands are normal. The right kidney is normal in appearance. No nephrolithiasis or enhancing renal mass. There is a crescent shaped low-attenuation fluid collection extending from 4:00 to 10:00 within the subcapsular space along the posterior and medial aspect of the right kidney extending from the upper pole through the interpolar region. The collection is low to  intermediate in attenuation and measures 4.3 x 8.9 x 1.9 cm (volume = 38 cm^3). Mildly striated nephrogram in the posterior aspect of the left upper pole. A left-sided ureteral stent is present. The proximal loop is appropriately reconstituted and within an interpolar calyx. The distal end is just within the bladder. No evidence of hydronephrosis. Mild periureteric stranding. Query some mild eccentric bladder wall thickening at the left UVJ. Stomach/Bowel: There are a few small sigmoid colonic diverticula but no evidence of active inflammation. No focal bowel wall thickening or evidence of obstruction. Vascular/Lymphatic: Calcifications visualized throughout the abdominal aorta. The infrarenal abdominal aorta is mildly aneurysmal at 3.1 cm. No suspicious lymphadenopathy. Reproductive: Prostate is unremarkable. Other: No abdominal wall hernia or abnormality. No abdominopelvic ascites. Musculoskeletal: No acute fracture or aggressive appearing lytic or blastic osseous lesion. Lower lumbar degenerative disc disease. IMPRESSION: 1. Small to moderate intermediate density left subcapsular fluid collection with an approximate volume of 38 mL. Additionally, there is striation and hypoenhancement of the renal parenchyma on the delayed phase imaging in the posterior aspect of the left upper pole. This combination of findings likely represents postoperative edema and a small subacute perinephric hemorrhage. However, given the clinical history of fever, pyelonephritis or infection of the subcapsular fluid are difficult to exclude entirely by imaging alone. If there is clinical concern for infection of the subcapsular fluid, percutaneous aspiration could be considered. 2. Left ureteral stent without evidence of hydronephrosis. 3. Mild left periureteral stranding and slight eccentric bladder wall thickening in the region of the left UVJ. Findings likely represent reactive changes/inflammation related to the recently placed  ureteral stent. 4. 3.1 cm infrarenal abdominal aortic aneurysm. Recommend followup by ultrasound in 3 years. This recommendation follows ACR  consensus guidelines: White Paper of the ACR Incidental Findings Committee II on Vascular Findings. J Am Coll Radiol 2013; 10:789-794 Aortic aneurysm NOS (ICD10-I71.9); Aortic Atherosclerosis (ICD10-170.0) 5. Coronary artery calcifications. 6. Mild sigmoid colonic diverticulosis without evidence of active diverticulitis. 7. Hepatic steatosis. Electronically Signed   By: Jacqulynn Cadet M.D.   On: 02/21/2019 09:13    Anti-infectives: Anti-infectives (From admission, onward)   Start     Dose/Rate Route Frequency Ordered Stop   02/22/19 1200  vancomycin (VANCOCIN) 1,750 mg in sodium chloride 0.9 % 500 mL IVPB     1,750 mg 250 mL/hr over 120 Minutes Intravenous Every 12 hours 02/22/19 1054     02/21/19 1600  ceFEPIme (MAXIPIME) 2 g in sodium chloride 0.9 % 100 mL IVPB     2 g 200 mL/hr over 30 Minutes Intravenous Every 8 hours 02/21/19 1223     02/21/19 1400  aztreonam (AZACTAM) injection 2 g  Status:  Discontinued     2 g Intramuscular Every 8 hours 02/21/19 1036 02/21/19 1222   02/21/19 0715  vancomycin (VANCOCIN) 2,000 mg in sodium chloride 0.9 % 500 mL IVPB     2,000 mg 250 mL/hr over 120 Minutes Intravenous  Once 02/21/19 0659 02/22/19 1058   02/21/19 0715  aztreonam (AZACTAM) 2 g in sodium chloride 0.9 % 100 mL IVPB     2 g 200 mL/hr over 30 Minutes Intravenous STAT 02/21/19 0700 02/21/19 0947   02/21/19 0700  metroNIDAZOLE (FLAGYL) IVPB 500 mg  Status:  Discontinued     500 mg 100 mL/hr over 60 Minutes Intravenous Every 8 hours 02/21/19 0654 02/22/19 1004      Assessment/Plan:   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 Stent in good postioin, will NOT manipulation in setting of infection.  2 - Urosepsis - agree with current ABX that have gram - and gram + MRSE coverage pending further data. Rec fever free x 24  hours and DC on PO ABX to give 10-14 day total course.   Do not favor addiitonal drianage / procedures unless deteriorates, then consider perinerphic drain / aspiration. OK do DC foley when afebrile x 24 hours.   Greatly appreciate medical team comanagement.   He has GU follow up arranged.   Please call me directly anytime with questions.    Alexis Frock 02/22/2019

## 2019-02-22 NOTE — Progress Notes (Addendum)
PROGRESS NOTE    Wesley [name]  BRA:[id number] DOB: 10-10-1956 DOA: 02/21/2019 PCP: [facility]   Brief Narrative:  63 year old with a history of AAA, anxiety, BPH, left renal pelvis carcinoma, COPD with emphysema, CAD, dementia, diabetes mellitus type 2, hyperlipidemia, obstructive sleep apnea, CVA with residual left-sided weakness, depression came to the hospital for evaluation of lethargy and confusion.  He was also found to have fevers and chills.  Recently had a left-sided ureteral stent placed on 02/18/2019.  Urology was consulted.  Admitted for pyelonephritis treatment.   Assessment & Plan:   Principal Problem:   Sepsis ([place]) Active Problems:   Cardiac pacemaker in situ   Essential hypertension   COPD mixed type ([place])   Tobacco use disorder   Anxiety and depression   BPH (benign prostatic hyperplasia)   History of substance abuse ([place])   SSS (sick sinus syndrome) (HCC)   History of stroke   Diabetes mellitus type 2 in obese (HCC)   Obstructive sleep apnea   Metabolic encephalopathy   Renal and perinephric abscess   Acute pyelonephritis  Sepsis secondary to left-sided abdominal pain secondary to acute pyelonephritis, sepsis present prior to admission - CT of the abdomen pelvis is consistent with pyelonephritis versus concerns for perinephric abscess hemorrhage.  Seen by urology who is planning on continuing antibiotics and monitoring him. -Follow-up culture data, continue IV vancomycin.  Switch aztreonam to cefepime.  Discontinue Flagyl. -Continue Foley catheter, monitor urine output. -We will likely need repeat renal ultrasound in next 24-48 hours  Essential hypertension - Continue Cardizem 180 mg daily.  Supportive care  History of COPD, stable -Continue home bronchodilators or its equivalent here.  Currently stable  Diabetes mellitus type 2, insulin-dependent Peripheral neuropathy secondary diabetes mellitus type 2 -Continue Lantus 33 units daily,  insulin sliding scale and Accu-Chek. -Continue gabapentin  Tobacco use -Nicotine patch if necessary  History of depression and substance abuse -Resume home medications  History of sick sinus syndrome with pacemaker in place - On Cardizem.  Continue to monitor.  History of seizure -Continue home medications including Keppra  History of CVA with mild left-sided residual defect, nonhemorrhagic -Continue aspirin and statin  BPH -On Flomax and finasteride  DVT prophylaxis: SCDs Code Status: Full code Family Communication: Wife at bedside Disposition Plan: Maintain in hospital stay until his infection has improved and urologic condition has been taking care of.  urology team following  Consultants:   Urology  Procedures:   None  Antimicrobials:   Aztreonam stopped 3/10, Flagyl stopped 3/10  Vancomycin 3/9 >  Cefepime 3/10>   Subjective: States he feels slightly better after getting antibiotics and IV fluids. Placed on 2 L nasal cannula here but tells me is not on any home oxygen therefore I advised nursing staff to attempt to remove this and wean him off  Review of Systems Otherwise negative except as per HPI, including: General: Denies fever, chills, night sweats or unintended weight loss. Resp: Denies cough, wheezing, shortness of breath. Cardiac: Denies chest pain, palpitations, orthopnea, paroxysmal nocturnal dyspnea. GI: Denies abdominal pain, nausea, vomiting, diarrhea or constipation GU: Denies dysuria, frequency, hesitancy or incontinence MS: Denies muscle aches, joint pain or swelling Neuro: Denies headache, neurologic deficits (focal weakness, numbness, tingling), abnormal gait Psych: Denies anxiety, depression, SI/HI/AVH Skin: Denies new rashes or lesions ID: Denies sick contacts, exotic exposures, travel  Objective: Vitals:   02/21/19 2324 02/22/19 0445 02/22/19 0500 02/22/19 0657  BP:  (!) 158/82    Pulse:  65  87   Resp:  18    Temp: 99.3 F  (37.4 C)  (!) 102.1 F (38.9 C) (!) 100.7 F (38.2 C)  TempSrc: Rectal  Rectal Oral  SpO2:  95%    Weight:      Height:        Intake/Output Summary (Last 24 hours) at 02/22/2019 1231 Last data filed at 02/22/2019 0941 Gross per 24 hour  Intake 2513.52 ml  Output 1650 ml  Net 863.52 ml   Filed Weights   02/21/19 0615  Weight: 97.5 kg    Examination:  General exam: Appears calm and comfortable, on 2 L nasal cannula Respiratory system: Clear to auscultation. Respiratory effort normal. Cardiovascular system: S1 & S2 heard, RRR. No JVD, murmurs, rubs, gallops or clicks. No pedal edema. Gastrointestinal system: Abdomen is nondistended, soft and nontender. No organomegaly or masses felt. Normal bowel sounds heard. Central nervous system: Alert and oriented. No focal neurological deficits. Extremities: Symmetric 5 x 5 power. Skin: No rashes, lesions or ulcers Psychiatry: Judgement and insight appear normal. Mood & affect appropriate.  Foley catheter in place with slightly dark urine   Data Reviewed:   CBC: Recent Labs  Lab 02/21/19 0626 02/22/19 0554  WBC 11.6* 11.6*  NEUTROABS 8.3*  --   HGB 14.0 11.6*  HCT 42.8 38.9*  MCV 86.1 88.8  PLT 205 696   Basic Metabolic Panel: Recent Labs  Lab 02/21/19 0626 02/22/19 0554  NA 137 139  K 3.7 3.7  CL 102 106  CO2 25 24  GLUCOSE 286* 166*  BUN 16 18  CREATININE 1.04 0.80  CALCIUM 9.1 8.0*   GFR: Estimated Creatinine Clearance: 111.3 mL/min (by C-G formula based on SCr of 0.8 mg/dL). Liver Function Tests: Recent Labs  Lab 02/21/19 0626  AST 10*  ALT 25  ALKPHOS 137*  BILITOT 1.0  PROT 7.6  ALBUMIN 4.0   Recent Labs  Lab 02/21/19 0626  LIPASE 27   No results for input(s): AMMONIA in the last 168 hours. Coagulation Profile: Recent Labs  Lab 02/21/19 0626  INR 1.0   Cardiac Enzymes: No results for input(s): CKTOTAL, CKMB, CKMBINDEX, TROPONINI in the last 168 hours. BNP (last 3 results) No results  for input(s): PROBNP in the last 8760 hours. HbA1C: No results for input(s): HGBA1C in the last 72 hours. CBG: Recent Labs  Lab 02/18/19 1214 02/21/19 2156 02/22/19 1132  GLUCAP 232* 155* 230*   Lipid Profile: No results for input(s): CHOL, HDL, LDLCALC, TRIG, CHOLHDL, LDLDIRECT in the last 72 hours. Thyroid Function Tests: No results for input(s): TSH, T4TOTAL, FREET4, T3FREE, THYROIDAB in the last 72 hours. Anemia Panel: No results for input(s): VITAMINB12, FOLATE, FERRITIN, TIBC, IRON, RETICCTPCT in the last 72 hours. Sepsis Labs: Recent Labs  Lab 02/21/19 0626 02/21/19 0800  LATICACIDVEN 1.1 1.0    Recent Results (from the past 240 hour(s))  Culture, blood (Routine x 2)     Status: None (Preliminary result)   Collection Time: 02/21/19  6:26 AM  Result Value Ref Range Status   Specimen Description   Final    BLOOD RIGHT HAND Performed at [facility] [address] [place], [place] [address]    Special Requests   Final    BOTTLES DRAWN AEROBIC AND ANAEROBIC Blood Culture results may not be optimal due to an excessive volume of blood received in culture bottles Performed at [facility] [address] [place], [place] [address]    Culture  Final    NO GROWTH < 24 HOURS Performed at [facility], [place] [address] [place], [place] [address]    Report Status PENDING  Incomplete  Culture, blood (Routine x 2)     Status: None (Preliminary result)   Collection Time: 02/21/19  6:26 AM  Result Value Ref Range Status   Specimen Description   Final    BLOOD LEFT FOREARM Performed at [facility], [place] [address], [place], [place] [address]    Special Requests   Final    BOTTLES DRAWN AEROBIC AND ANAEROBIC Blood Culture results may not be optimal due to an excessive volume of blood received in culture bottles Performed at [facility] [address], [place], [place] [address]    Culture   Final     NO GROWTH < 24 HOURS Performed at [facility] [address], [place], [place] [address]    Report Status PENDING  Incomplete  Urine culture     Status: Abnormal (Preliminary result)   Collection Time: 02/21/19  6:26 AM  Result Value Ref Range Status   Specimen Description   Final    URINE, CLEAN CATCH Performed at [facility], [place] [address], [place], [place] [address]    Special Requests   Final    NONE Performed at [facility], [place] [address] [place], [place] [address]    Culture >=100,000 COLONIES/mL STAPHYLOCOCCUS EPIDERMIDIS (A)  Final   Report Status PENDING  Incomplete         Radiology Studies: Dg Chest 2 View  Result Date: 02/21/2019 CLINICAL DATA:  Slurred speech, possible sepsis. EXAM: CHEST - 2 VIEW COMPARISON:  Chest radiograph November 23, 2018 FINDINGS: Cardiac silhouette is upper limits of normal. Calcified aortic arch. RIGHT apical bullous changes. Thickening of the RIGHT major fissure. No pleural effusion or focal consolidations. No pneumothorax. Dual lead LEFT cardiac pacemaker in situ. Soft tissue planes and included osseous structures are unchanged. IMPRESSION: 1. Borderline cardiomegaly.  No acute pulmonary process. Electronically Signed   By: Elon Alas M.D.   On: 02/21/2019 06:57   Ct Head Wo Contrast  Result Date: 02/21/2019 CLINICAL DATA:  Slurred speech, fever and frequent urination. EXAM: CT HEAD WITHOUT CONTRAST TECHNIQUE: Contiguous axial images were obtained from the base of the skull through the vertex without intravenous contrast. COMPARISON:  11/23/2018 FINDINGS: Brain: No evidence of acute infarction, hemorrhage, hydrocephalus, extra-axial collection or mass lesion/mass effect. Moderate brain parenchymal volume loss and deep white matter microangiopathy. Vascular: Calcific atherosclerotic disease of the intra cavernous carotid arteries. Skull: Normal. Negative for fracture or focal lesion.  Sinuses/Orbits: Mucosal thickening of the left ethmoid sinus and left nasal mucosal passages. Other: None. IMPRESSION: 1. No acute intracranial abnormality. 2. Atrophy, chronic microvascular disease. Electronically Signed   By: Fidela Salisbury M.D.   On: 02/21/2019 08:50   Ct Abdomen Pelvis W Contrast  Result Date: 02/21/2019 CLINICAL DATA:  63 year old male with a history of urothelial carcinoma now 3 days status post left ureteroscopy with laser ablation of calyceal tumor and placement of a left ureteral stent. Patient presents with slurred speech, fever and urinary frequency EXAM: CT ABDOMEN AND PELVIS WITH CONTRAST TECHNIQUE: Multidetector CT imaging of the abdomen and pelvis was performed using the standard protocol following bolus administration of intravenous contrast. CONTRAST:  171mL ISOVUE-300 IOPAMIDOL (ISOVUE-300) INJECTION 61% COMPARISON:  Intraoperative images [id number]; prior CT scan of the abdomen and pelvis 11/23/2018 FINDINGS: Lower chest:  Minimal dependent atelectasis. No evidence of pneumonia or pleural effusion. Incompletely imaged cardiac rhythm maintenance device with leads in the right atrium and right ventricle. Calcifications visualized along the coronary arteries. The heart is normal in size. No pericardial effusion. Unremarkable distal thoracic esophagus. Hepatobiliary: Diffuse low attenuation of the hepatic parenchyma with relative sparing at the margin of the gallbladder most consistent with hepatic steatosis. No cirrhotic change or solid lesion visualized. Gallbladder is unremarkable. No intra or extrahepatic biliary ductal dilatation. Pancreas: Unremarkable. No pancreatic ductal dilatation or surrounding inflammatory changes. Spleen: Punctate calcification within the mid splenic parenchyma consistent with old granulomatous disease. Adrenals/Urinary Tract: The adrenal glands are normal. The right kidney is normal in appearance. No nephrolithiasis or enhancing renal mass. There is a  crescent shaped low-attenuation fluid collection extending from 4:00 to 10:00 within the subcapsular space along the posterior and medial aspect of the right kidney extending from the upper pole through the interpolar region. The collection is low to intermediate in attenuation and measures 4.3 x 8.9 x 1.9 cm (volume = 38 cm^3). Mildly striated nephrogram in the posterior aspect of the left upper pole. A left-sided ureteral stent is present. The proximal loop is appropriately reconstituted and within an interpolar calyx. The distal end is just within the bladder. No evidence of hydronephrosis. Mild periureteric stranding. Query some mild eccentric bladder wall thickening at the left UVJ. Stomach/Bowel: There are a few small sigmoid colonic diverticula but no evidence of active inflammation. No focal bowel wall thickening or evidence of obstruction. Vascular/Lymphatic: Calcifications visualized throughout the abdominal aorta. The infrarenal abdominal aorta is mildly aneurysmal at 3.1 cm. No suspicious lymphadenopathy. Reproductive: Prostate is unremarkable. Other: No abdominal wall hernia or abnormality. No abdominopelvic ascites. Musculoskeletal: No acute fracture or aggressive appearing lytic or blastic osseous lesion. Lower lumbar degenerative disc disease. IMPRESSION: 1. Small to moderate intermediate density left subcapsular fluid collection with an approximate volume of 38 mL. Additionally, there is striation and hypoenhancement of the renal parenchyma on the delayed phase imaging in the posterior aspect of the left upper pole. This combination of findings likely represents postoperative edema and a small subacute perinephric hemorrhage. However, given the clinical history of fever, pyelonephritis or infection of the subcapsular fluid are difficult to exclude entirely by imaging alone. If there is clinical concern for infection of the subcapsular fluid, percutaneous aspiration could be considered. 2. Left  ureteral stent without evidence of hydronephrosis. 3. Mild left periureteral stranding and slight eccentric bladder wall thickening in the region of the left UVJ. Findings likely represent reactive changes/inflammation related to the recently placed ureteral stent. 4. 3.1 cm infrarenal abdominal aortic aneurysm. Recommend followup by ultrasound in 3 years. This recommendation follows ACR consensus guidelines: White Paper of the ACR Incidental Findings Committee II on Vascular Findings. J Am Coll Radiol 2013; 10:789-794 Aortic aneurysm NOS (ICD10-I71.9); Aortic Atherosclerosis (ICD10-170.0) 5. Coronary artery calcifications. 6. Mild sigmoid colonic diverticulosis without evidence of active diverticulitis. 7. Hepatic steatosis. Electronically Signed   By: Jacqulynn Cadet M.D.   On: 02/21/2019 09:13        Scheduled Meds: . albuterol  2.5 mg Nebulization BID  . atorvastatin  40 mg Oral QHS  . brimonidine  1 drop Both Eyes QHS  . budesonide  0.25 mg Nebulization BID  . diltiazem  180 mg Oral Daily  . feeding supplement (ENSURE ENLIVE)  237 mL Oral Q24H  . finasteride  5 mg Oral Daily  . gabapentin  300 mg Oral TID  .  insulin aspart  0-15 Units Subcutaneous TID WC  . insulin aspart  0-5 Units Subcutaneous QHS  . insulin glargine  33 Units Subcutaneous QHS  . latanoprost  1 drop Both Eyes QHS  . levETIRAcetam  750 mg Oral BID  . lurasidone  20 mg Oral QHS  . meloxicam  15 mg Oral Daily  . mirabegron ER  25 mg Oral Daily  . mirtazapine  7.5 mg Oral QHS  . nicotine  14 mg Transdermal Daily  . pantoprazole  40 mg Oral BID AC  . polyethylene glycol  17 g Oral Daily  . polyvinyl alcohol  1 drop Both Eyes QHS  . senna  1 tablet Oral QHS  . venlafaxine XR  150 mg Oral Q breakfast   Continuous Infusions: . sodium chloride 125 mL/hr at 02/22/19 0941  . ceFEPime (MAXIPIME) IV 2 g (02/22/19 0900)  . vancomycin 1,750 mg (02/22/19 1145)     LOS: 1 day   Time spent= 35 mins     Arsenio Loader, MD [facility]  If 7PM-7AM, please contact night-coverage [email or web address] 02/22/2019, 12:31 PM

## 2019-02-22 NOTE — Progress Notes (Signed)
Per MD instructions, reduced patient oxygen until he is no longer requiring oxygen; patient maintaining oxygen level in mid 90s to 96.

## 2019-02-22 NOTE — Progress Notes (Signed)
Pharmacy Antibiotic Note  Wesley [name] is a 63 y.o. male admitted on 02/21/2019 with pyelonephritis vs post L ureteral stent placement infection.  Pharmacy has been consulted for Vancomycin dosing.  Today 02/22/19:  Still with low-grade fevers this AM  WBC slightly elevated, stable  SCr stable at baseline  Day 1 of Vancomycin  Day 2 of Cefepime  Plan:  Vancomycin 1750 mg IV Q12H  Estimated AUC: 515.1 (SCr 0.8; Vd 0.72)  Estimated trough: 13.8  Monitor SCr Q48H while on vanc  Cefepime per MD; dosing is reasonable: may be able to narrow soon with GPCs growing in urine  Flagyl discontinued   Height: 6\' 2"  (188 cm) Weight: 215 lb (97.5 kg) IBW/kg (Calculated) : 82.2  Temp (24hrs), Avg:100.9 F (38.3 C), Min:99.3 F (37.4 C), Max:102.1 F (38.9 C)  Recent Labs  Lab 02/21/19 0626 02/21/19 0800 02/22/19 0554  WBC 11.6*  --  11.6*  CREATININE 1.04  --  0.80  LATICACIDVEN 1.1 1.0  --     Estimated Creatinine Clearance: 111.3 mL/min (by C-G formula based on SCr of 0.8 mg/dL).    Allergies  Allergen Reactions  . Penicillins Swelling    "General swelling" Tolerated cefepime & ceftriaxone previously. Has patient had a PCN reaction causing immediate rash, facial/tongue/throat swelling, SOB or lightheadedness with hypotension: Yes Has patient had a PCN reaction causing severe rash involving mucus membranes or skin necrosis: Unk Has patient had a PCN reaction that required hospitalization: Yes Has patient had a PCN reaction occurring within the last 10 years: No If all of the above answers are "NO", then may proceed with Cephalosporin  . Percocet [Oxycodone-Acetaminophen]     Itching  . Levaquin [Levofloxacin In D5w] Hives    Antimicrobials this admission: Vanc 3/10 >> Cefepime 3/9 >> Vanc/Aztreonam/Flagyl x 1 on 3/9   Microbiology results: 3/9 BCx: NG x <24H 3/9 UCx: >100K staphylococcus epidermidis  Thank you for allowing pharmacy to be a part of this  patient's care.  Knox Saliva, PharmD Candidate 02/22/2019 11:21 AM

## 2019-02-23 ENCOUNTER — Other Ambulatory Visit (HOSPITAL_COMMUNITY): Payer: Non-veteran care

## 2019-02-23 DIAGNOSIS — Z950 Presence of cardiac pacemaker: Secondary | ICD-10-CM

## 2019-02-23 DIAGNOSIS — A411 Sepsis due to other specified staphylococcus: Principal | ICD-10-CM

## 2019-02-23 DIAGNOSIS — Z8603 Personal history of neoplasm of uncertain behavior: Secondary | ICD-10-CM

## 2019-02-23 DIAGNOSIS — K5900 Constipation, unspecified: Secondary | ICD-10-CM

## 2019-02-23 DIAGNOSIS — N10 Acute tubulo-interstitial nephritis: Secondary | ICD-10-CM

## 2019-02-23 DIAGNOSIS — F17211 Nicotine dependence, cigarettes, in remission: Secondary | ICD-10-CM

## 2019-02-23 DIAGNOSIS — I252 Old myocardial infarction: Secondary | ICD-10-CM

## 2019-02-23 DIAGNOSIS — E118 Type 2 diabetes mellitus with unspecified complications: Secondary | ICD-10-CM

## 2019-02-23 DIAGNOSIS — I69354 Hemiplegia and hemiparesis following cerebral infarction affecting left non-dominant side: Secondary | ICD-10-CM

## 2019-02-23 DIAGNOSIS — I2510 Atherosclerotic heart disease of native coronary artery without angina pectoris: Secondary | ICD-10-CM

## 2019-02-23 DIAGNOSIS — R7881 Bacteremia: Secondary | ICD-10-CM

## 2019-02-23 DIAGNOSIS — I69392 Facial weakness following cerebral infarction: Secondary | ICD-10-CM

## 2019-02-23 LAB — BASIC METABOLIC PANEL
Anion gap: 6 (ref 5–15)
BUN: 15 mg/dL (ref 8–23)
CO2: 25 mmol/L (ref 22–32)
Calcium: 8.3 mg/dL — ABNORMAL LOW (ref 8.9–10.3)
Chloride: 107 mmol/L (ref 98–111)
Creatinine, Ser: 0.86 mg/dL (ref 0.61–1.24)
GFR calc Af Amer: >60 mL/min (ref >60)
GFR calc non Af Amer: >60 mL/min (ref >60)
Glucose, Bld: 200 mg/dL — ABNORMAL HIGH (ref 70–99)
Potassium: 3.7 mmol/L (ref 3.5–5.1)
Sodium: 138 mmol/L (ref 135–145)

## 2019-02-23 LAB — URINE CULTURE: Culture: >=100000 — AB

## 2019-02-23 LAB — CBC
HCT: 35.5 % — ABNORMAL LOW (ref 39.0–52.0)
Hemoglobin: 10.9 g/dL — ABNORMAL LOW (ref 13.0–17.0)
MCH: 26.8 pg (ref 26.0–34.0)
MCHC: 30.7 g/dL (ref 30.0–36.0)
MCV: 87.2 fL (ref 80.0–100.0)
Platelets: 169 10*3/uL (ref 150–400)
RBC: 4.07 MIL/uL — ABNORMAL LOW (ref 4.22–5.81)
RDW: 13.8 % (ref 11.5–15.5)
WBC: 8.6 10*3/uL (ref 4.0–10.5)
nRBC: 0 % (ref 0.0–0.2)

## 2019-02-23 LAB — GLUCOSE, CAPILLARY
GLUCOSE-CAPILLARY: 153 mg/dL — AB (ref 70–99)
Glucose-Capillary: 213 mg/dL — ABNORMAL HIGH (ref 70–99)
Glucose-Capillary: 164 mg/dL — ABNORMAL HIGH (ref 70–99)
Glucose-Capillary: 236 mg/dL — ABNORMAL HIGH (ref 70–99)

## 2019-02-23 LAB — MAGNESIUM: Magnesium: 1.9 mg/dL (ref 1.7–2.4)

## 2019-02-23 MED ORDER — SENNOSIDES-DOCUSATE SODIUM 8.6-50 MG PO TABS: 2.0000 | ORAL_TABLET | Freq: Every evening | ORAL | Status: DC | PRN

## 2019-02-23 MED ORDER — SORBITOL 70 % SOLN
30.0000 mL | Freq: Every day | Status: DC | PRN
  Filled 2019-02-23: qty 30

## 2019-02-23 MED ORDER — BISACODYL 10 MG RE SUPP: 10.0000 mg | RECTAL | Status: DC | PRN

## 2019-02-23 MED ORDER — MORPHINE SULFATE (PF) 2 MG/ML IV SOLN
2.0000 mg | Freq: Once | INTRAVENOUS | Status: AC
  Administered 2019-02-23: 2 mg via INTRAVENOUS
  Filled 2019-02-23: qty 1

## 2019-02-23 MED ORDER — HYDROCODONE-ACETAMINOPHEN 10-325 MG PO TABS
1.0000 | ORAL_TABLET | ORAL | Status: DC | PRN
  Administered 2019-02-23 – 2019-02-27 (×12): 1 via ORAL
  Filled 2019-02-23 (×12): qty 1

## 2019-02-23 MED ORDER — INSULIN ASPART 100 UNIT/ML ~~LOC~~ SOLN
0.0000 [IU] | Freq: Three times a day (TID) | SUBCUTANEOUS | Status: DC
  Administered 2019-02-23: 7 [IU] via SUBCUTANEOUS
  Administered 2019-02-23: 4 [IU] via SUBCUTANEOUS
  Administered 2019-02-24: 11 [IU] via SUBCUTANEOUS
  Administered 2019-02-24: 4 [IU] via SUBCUTANEOUS
  Administered 2019-02-25: 3 [IU] via SUBCUTANEOUS
  Administered 2019-02-25: 7 [IU] via SUBCUTANEOUS
  Administered 2019-02-26: 3 [IU] via SUBCUTANEOUS
  Administered 2019-02-26: 4 [IU] via SUBCUTANEOUS
  Administered 2019-02-26: 7 [IU] via SUBCUTANEOUS
  Administered 2019-02-27 (×3): 4 [IU] via SUBCUTANEOUS
  Administered 2019-02-28: 7 [IU] via SUBCUTANEOUS
  Administered 2019-02-28: 11 [IU] via SUBCUTANEOUS

## 2019-02-23 MED ORDER — INSULIN ASPART 100 UNIT/ML ~~LOC~~ SOLN
0.0000 [IU] | Freq: Every day | SUBCUTANEOUS | Status: DC
  Administered 2019-02-23 – 2019-02-24 (×2): 2 [IU] via SUBCUTANEOUS
  Administered 2019-02-26: 3 [IU] via SUBCUTANEOUS

## 2019-02-23 MED ORDER — RIFAMPIN 300 MG PO CAPS
300.0000 mg | ORAL_CAPSULE | Freq: Two times a day (BID) | ORAL | Status: DC
  Filled 2019-02-23: qty 1

## 2019-02-23 MED ORDER — POLYETHYLENE GLYCOL 3350 17 G PO PACK: 17.0000 g | PACK | Freq: Every day | ORAL | Status: DC | PRN

## 2019-02-23 NOTE — TOC Initial Note (Signed)
Transition of Care [facility]) - Initial/Assessment Note    Patient Details  Name: Wesley [name] MRN: [id number] Date of Birth: May 11, 1956  [facility] CM/SW Contact:    Dessa Phi, RN Phone Number: 02/23/2019, 4:05 PM  Clinical Narrative:  Sepsis. From home w/spouse. Spoke to spouse & patient about d/c plans. Will continue to follow for d/c needs.                 Expected Discharge Plan: Home/Self Care Barriers to Discharge: No Barriers Identified   Patient Goals and CMS Choice Patient states their goals for this hospitalization and ongoing recovery are:: (get better)   Choice offered to / list presented to : NA  Expected Discharge Plan and Services Expected Discharge Plan: Home/Self Care Discharge Planning Services: CM Consult     Expected Discharge Date: (unknown)                        Prior Living Arrangements/Services   Lives with:: Spouse Patient language and need for interpreter reviewed:: Yes Do you feel safe going back to the place where you live?: Yes      Need for Family Participation in Patient Care: No (Comment) Care giver support system in place?: Yes (comment)   Criminal Activity/Legal Involvement Pertinent to Current Situation/Hospitalization: No - Comment as needed  Activities of Daily Living Home Assistive Devices/Equipment: Eyeglasses, Cane (specify quad or straight), Walker (specify type), CBG Meter(bilateral hearing aids, front wheeled walker, single point cane) ADL Screening (condition at time of admission) Patient's cognitive ability adequate to safely complete daily activities?: Yes Is the patient deaf or have difficulty hearing?: Yes(wears bilateral hearing aids) Does the patient have difficulty seeing, even when wearing glasses/contacts?: No Does the patient have difficulty concentrating, remembering, or making decisions?: Yes Patient able to express need for assistance with ADLs?: Yes Does the patient have difficulty  dressing or bathing?: No Independently performs ADLs?: Yes (appropriate for developmental age) Does the patient have difficulty walking or climbing stairs?: Yes Weakness of Legs: Both Weakness of Arms/Hands: Both  Permission Sought/Granted Permission sought to share information with : Case Manager Permission granted to share information with : Yes, Verbal Permission Granted(spouse-Linda Negro)              Emotional Assessment Appearance:: Well-Groomed Attitude/Demeanor/Rapport: Gracious Affect (typically observed): Accepting Orientation: : Oriented to Self, Oriented to Place, Oriented to  Time, Oriented to Situation Alcohol / Substance Use: Never Used Psych Involvement: No (comment)  Admission diagnosis:  Confusion [R41.0] Fever, unspecified fever cause [R50.9] Patient Active Problem List   Diagnosis Date Noted  . Renal and perinephric abscess 02/21/2019  . Acute pyelonephritis 02/21/2019  . Abdominal distension (gaseous)   . Gastroesophageal reflux disease   . Constipation   . Ileus ([place]) 11/26/2018  . Coronary artery calcification seen on CT scan   . Metabolic encephalopathy   . CAP (community acquired pneumonia) 11/23/2018  . Hematuria 11/23/2018  . Right sided weakness 06/12/2018  . UTI (urinary tract infection) 04/24/2018  . Sepsis ([place]) 02/20/2018  . Acute lower UTI 02/20/2018  . Seizures ([place]) 02/20/2018  . Pneumonia of right middle lobe due to infectious organism ([place]) 07/07/2017  . Neck muscle spasm 07/07/2017  . Acute eczematoid otitis externa of right ear 07/07/2017  . Acute encephalopathy 05/20/2017  . Osteoarthritis 03/30/2017  . Dysconjugate gaze   . Obstructive sleep apnea 09/23/2016  . Diabetes mellitus type 2 in obese ([place]) 08/31/2016  . History  of stroke   . Left sided numbness 08/28/2016  . Aphasia 08/28/2016  . Erectile dysfunction 07/22/2016  . Carotid stenosis 05/22/2016  . SSS (sick sinus syndrome) ([place]) 05/22/2016  . Mechanical low  back pain 04/18/2016  . Hearing loss 04/15/2016  . Palpitations 04/05/2016  . Syncope and collapse 04/05/2016  . Syncope 04/05/2016  . Tremor, essential 03/31/2016  . Chronic insomnia 03/31/2016  . Chronic pain in right foot 03/17/2016  . Intention tremor 03/17/2016  . Pain in both wrists 03/17/2016  . Branch retinal vein occlusion of right eye 02/14/2016  . HLD (hyperlipidemia) 11/30/2015  . BPH (benign prostatic hyperplasia) 11/29/2015  . History of substance abuse ([place]) 11/29/2015  . Memory loss 11/29/2015  . Arthralgia 11/29/2015  . Vitamin D insufficiency 11/05/2015  . Pain of molar 11/01/2015  . Anxiety and depression 11/01/2015  . Tingling in extremities 11/01/2015  . Cardiac pacemaker in situ 10/04/2015  . Essential hypertension 10/04/2015  . COPD mixed type ([place]) 10/04/2015  . Tobacco use disorder 10/04/2015   PCP:  Clinic, [place]:   [id number] [place], [place] [address] [place] [address] Phone: [phone] Fax: [phone]     Social Determinants of Health (SDOH) Interventions    Readmission Risk Interventions 30 Day Unplanned Readmission Risk Score     ED to Hosp-Admission (Current) from 02/21/2019 in [place]  30 Day Unplanned Readmission Risk Score (%)  31 Filed at 02/23/2019 1600     This score is the patient's risk of an unplanned readmission within 30 days of being discharged (0 -100%). The score is based on dignosis, age, lab data, medications, orders, and past utilization.   Low:  0-14.9   Medium: 15-21.9   High: 22-29.9   Extreme: 30 and above       No flowsheet data found.

## 2019-02-23 NOTE — Consult Note (Addendum)
[facility]    Date of Admission:  02/21/2019   Total days of antibiotics: 2 vanco               Reason for Consult: Staph bacteremia, Pacer   Referring Provider: Elgergawy   Assessment: Sepsis MRSE bacteremia and UCx CVA Pyelonephritis after recent procedure Uncontrolled DM (A1C 9.7%)  Plan: 1. Continue vanco 2. Hold rifampin (due to prosthetic). He has several drug interactions  3. Repeat BCx sent this Am.  4. Await TEE.  5. Renal u/s 3-13 to f/u collection 6. Cr normal on vanco. 7. Defer to urology if stent needs removal.  8. My great appreciation to pharmacy for their monitoring.   Thank you so much for this interesting consult,  Principal Problem:   Sepsis ([place]) Active Problems:   Cardiac pacemaker in situ   Essential hypertension   COPD mixed type ([place])   Tobacco use disorder   Anxiety and depression   BPH (benign prostatic hyperplasia)   History of substance abuse ([place])   SSS (sick sinus syndrome) ([place])   History of stroke   Diabetes mellitus type 2 in obese ([place])   Obstructive sleep apnea   Metabolic encephalopathy   Renal and perinephric abscess   Acute pyelonephritis   . albuterol  2.5 mg Nebulization BID  . arformoterol  15 mcg Nebulization BID   And  . umeclidinium bromide  1 puff Inhalation Daily  . aspirin  325 mg Oral Daily  . atorvastatin  40 mg Oral QHS  . brimonidine  1 drop Both Eyes QHS  . budesonide  0.25 mg Nebulization BID  . cholecalciferol  2,000 Units Oral Daily  . diltiazem  180 mg Oral Daily  . feeding supplement (ENSURE ENLIVE)  237 mL Oral Q24H  . finasteride  5 mg Oral Daily  . gabapentin  300 mg Oral TID  . insulin aspart  0-20 Units Subcutaneous TID WC  . insulin aspart  0-5 Units Subcutaneous QHS  . insulin glargine  33 Units Subcutaneous QHS  . latanoprost  1 drop Both Eyes QHS  . levETIRAcetam  750 mg Oral BID  . loratadine  10 mg Oral Daily  . lurasidone  20 mg Oral QHS  .  mirabegron ER  25 mg Oral Daily  . mirtazapine  7.5 mg Oral QHS  . nicotine  14 mg Transdermal Daily  . pantoprazole  40 mg Oral BID AC  . polyethylene glycol  17 g Oral Daily  . polyvinyl alcohol  1 drop Both Eyes QHS  . senna  1 tablet Oral QHS  . tamsulosin  0.4 mg Oral QPC supper  . venlafaxine XR  150 mg Oral Q breakfast    HPI: Wesley [name] is a 63 y.o. 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  He was brought to ED on 3-9 with temp 102, chills, lethargy, myalgias, and weakness. His CT showed pyelo with question of perinephric abscess. He was started on vanco/cefepime, his BCx have since resulted S epidermidis (MRSE).   Review of Systems: Review of Systems  Constitutional: Positive for chills, diaphoresis and fever.  Gastrointestinal: Positive for constipation. Negative for diarrhea.  Genitourinary: Positive for flank pain, frequency and urgency.  Neurological: Positive for sensory change.  Please see HPI. All other systems reviewed and negative.  Past Medical History:  Diagnosis Date  . Aneurysm of infrarenal abdominal aorta ([place])    CT 11-23-2018 , 3.2cm  . Anxiety   . Benign localized prostatic hyperplasia with lower urinary tract symptoms (LUTS)   . Cancer [facility])    kidney cancer  . 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 CAP (community acquired pneumonia) 11/23/2018   per pt had follow up by pcp at [facility] with CXR done after christmas  . Chronic insomnia   . COPD with emphysema ([place])   . Coronary artery disease    followed by cardiologist @ [facility]---  per cardiac cath 07-11-2015 mild plaquing of the CFx and RCA, normal LVF [facility] copy in epic)  . Dementia ([place])    "some per wife"   . Depression   . Diabetes mellitus without complication ([place])    type 2   . Diverticulosis of colon   . GERD (gastroesophageal reflux disease)    . Hematuria   . 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 History of diverticulitis of colon   . History of gout   . History of recurrent UTIs   . History of syncope    multiple episodes  . History of treatment for tuberculosis    per pt approx. 2001  . History of urinary retention   . Hyperlipidemia   . Hypertension   . Incomplete emptying of bladder   . Left-sided weakness 08/27/2016   CVA residual  . Myocardial infarction ([place]) 2015  . OA (osteoarthritis)    knees, feet, wrists, back  . Pacemaker   . Polysubstance abuse ([place])    per pt from 2001 to 2016 has had couple of relapses since--  12-31-2018 per pt last relapse with cocaine approx. Oct 2019  . Renal mass, left    pelvic  . Retinal vein occlusion 01/2016   right eye, secondary to hypertension  . S/P placement of cardiac pacemaker 12/11/2014   St Jude dual chamber (followed by [employer])  . Seizures [facility])    wife reported last one few months ago on preop call of 02/01/2019  . Shortness of breath   . Sleep apnea    cpap   . SSS (sick sinus syndrome) ([facility])    treatment pacemaker placement  . Stroke [facility])    had therapy  slow movement now ( 02/01/2019)  . Tremor, essential 03/31/2016  . Tuberculosis    1990s   . Wears glasses   . Wears hearing aid in both ears     Social History   Tobacco Use  . Smoking status: Former Smoker    Years: 40.00    Types: Cigarettes    Last attempt to quit: 11/25/2018    Years since quitting: 0.2  . Smokeless tobacco: Never Used  . Tobacco comment: per trying to quit with nicotine patch,  12-31-2017 per pt 1ppwk  Substance Use Topics  . Alcohol use: Not Currently    Alcohol/week: 0.0 standard drinks    Comment: hx ETOH abuse from 2001-2016 , has relapsed  . Drug use: Not Currently    Comment: Hx crack/street drugs and other substances from 2001-2016,has  relapsed since-last relapse-cocaine Oct.2019 per patient-none since then    Family History  Problem Relation Age of Onset  . Cancer Mother   . Cancer Father   . Cancer Sister  lung  . Glaucoma Brother   . Cancer Brother   . Colon cancer Neg Hx   . Dementia Neg Hx   . Tremor Neg Hx      Medications:  Scheduled: . albuterol  2.5 mg Nebulization BID  . arformoterol  15 mcg Nebulization BID   And  . umeclidinium bromide  1 puff Inhalation Daily  . aspirin  325 mg Oral Daily  . atorvastatin  40 mg Oral QHS  . brimonidine  1 drop Both Eyes QHS  . budesonide  0.25 mg Nebulization BID  . cholecalciferol  2,000 Units Oral Daily  . diltiazem  180 mg Oral Daily  . feeding supplement (ENSURE ENLIVE)  237 mL Oral Q24H  . finasteride  5 mg Oral Daily  . gabapentin  300 mg Oral TID  . insulin aspart  0-20 Units Subcutaneous TID WC  . insulin aspart  0-5 Units Subcutaneous QHS  . insulin glargine  33 Units Subcutaneous QHS  . latanoprost  1 drop Both Eyes QHS  . levETIRAcetam  750 mg Oral BID  . loratadine  10 mg Oral Daily  . lurasidone  20 mg Oral QHS  . mirabegron ER  25 mg Oral Daily  . mirtazapine  7.5 mg Oral QHS  . nicotine  14 mg Transdermal Daily  . pantoprazole  40 mg Oral BID AC  . polyethylene glycol  17 g Oral Daily  . polyvinyl alcohol  1 drop Both Eyes QHS  . senna  1 tablet Oral QHS  . tamsulosin  0.4 mg Oral QPC supper  . venlafaxine XR  150 mg Oral Q breakfast    Abtx:  Anti-infectives (From admission, onward)   Start     Dose/Rate Route Frequency Ordered Stop   02/22/19 1200  vancomycin (VANCOCIN) 1,750 mg in sodium chloride 0.9 % 500 mL IVPB     1,750 mg 250 mL/hr over 120 Minutes Intravenous Every 12 hours 02/22/19 1054     02/21/19 1600  ceFEPIme (MAXIPIME) 2 g in sodium chloride 0.9 % 100 mL IVPB  Status:  Discontinued     2 g 200 mL/hr over 30 Minutes Intravenous Every 8 hours 02/21/19 1223 02/23/19 1038   02/21/19 1400  aztreonam (AZACTAM)  injection 2 g  Status:  Discontinued     2 g Intramuscular Every 8 hours 02/21/19 1036 02/21/19 1222   02/21/19 0715  vancomycin (VANCOCIN) 2,000 mg in sodium chloride 0.9 % 500 mL IVPB     2,000 mg 250 mL/hr over 120 Minutes Intravenous  Once 02/21/19 0659 02/22/19 1058   02/21/19 0715  aztreonam (AZACTAM) 2 g in sodium chloride 0.9 % 100 mL IVPB     2 g 200 mL/hr over 30 Minutes Intravenous STAT 02/21/19 0700 02/21/19 0947   02/21/19 0700  metroNIDAZOLE (FLAGYL) IVPB 500 mg  Status:  Discontinued     500 mg 100 mL/hr over 60 Minutes Intravenous Every 8 hours 02/21/19 0654 02/22/19 1004        OBJECTIVE: Blood pressure (!) 163/81, pulse 65, temperature 98.9 F (37.2 C), temperature source Oral, resp. rate 16, height 6\' 2"  (1.88 m), weight 97.5 kg, SpO2 95 %.  Physical Exam Constitutional:      General: He is not in acute distress. HENT:     Mouth/Throat:     Mouth: Mucous membranes are moist.     Pharynx: No oropharyngeal exudate.  Eyes:     Extraocular Movements: Extraocular movements intact.     Pupils:  Pupils are equal, round, and reactive to light.  Neck:     Musculoskeletal: Normal range of motion and neck supple. No neck rigidity.  Cardiovascular:     Rate and Rhythm: Normal rate and regular rhythm.  Pulmonary:     Effort: Pulmonary effort is normal.     Breath sounds: Normal breath sounds.  Abdominal:     General: Bowel sounds are normal. There is no distension.     Palpations: Abdomen is soft.     Tenderness: There is left CVA tenderness.  Musculoskeletal:        General: No swelling.     Right lower leg: No edema.     Left lower leg: No edema.  Neurological:     Mental Status: He is alert.     Sensory: No sensory deficit.     Comments: Mild L facial droop.      Lab Results Results for orders placed or performed during the hospital encounter of 02/21/19 (from the past 48 hour(s))  Glucose, capillary     Status: Abnormal   Collection Time: 02/21/19  9:56  PM  Result Value Ref Range   Glucose-Capillary 155 (H) 70 - 99 mg/dL  Basic metabolic panel     Status: Abnormal   Collection Time: 02/22/19  5:54 AM  Result Value Ref Range   Sodium 139 135 - 145 mmol/L   Potassium 3.7 3.5 - 5.1 mmol/L   Chloride 106 98 - 111 mmol/L   CO2 24 22 - 32 mmol/L   Glucose, Bld 166 (H) 70 - 99 mg/dL   BUN 18 8 - 23 mg/dL   Creatinine, Ser 0.80 0.61 - 1.24 mg/dL   Calcium 8.0 (L) 8.9 - 10.3 mg/dL   GFR calc non Af Amer >60 >60 mL/min   GFR calc Af Amer >60 >60 mL/min   Anion gap 9 5 - 15    Comment: Performed at [facility], [place] [address], [place], [place] [address]  CBC     Status: Abnormal   Collection Time: 02/22/19  5:54 AM  Result Value Ref Range   WBC 11.6 (H) 4.0 - 10.5 K/uL   RBC 4.38 4.22 - 5.81 MIL/uL   Hemoglobin 11.6 (L) 13.0 - 17.0 g/dL   HCT 38.9 (L) 39.0 - 52.0 %   MCV 88.8 80.0 - 100.0 fL   MCH 26.5 26.0 - 34.0 pg   MCHC 29.8 (L) 30.0 - 36.0 g/dL   RDW 13.8 11.5 - 15.5 %   Platelets 177 150 - 400 K/uL   nRBC 0.0 0.0 - 0.2 %    Comment: Performed at [facility], [place] [address], [place], [place] [address]  Glucose, capillary     Status: Abnormal   Collection Time: 02/22/19 11:32 AM  Result Value Ref Range   Glucose-Capillary 230 (H) 70 - 99 mg/dL   Comment 1 Notify RN    Comment 2 Document in Chart   Glucose, capillary     Status: Abnormal   Collection Time: 02/22/19  4:24 PM  Result Value Ref Range   Glucose-Capillary 249 (H) 70 - 99 mg/dL  Glucose, capillary     Status: Abnormal   Collection Time: 02/22/19  9:26 PM  Result Value Ref Range   Glucose-Capillary 307 (H) 70 - 99 mg/dL  Basic metabolic panel     Status: Abnormal   Collection Time: 02/23/19  5:58 AM  Result Value Ref Range   Sodium 138 135 - 145  mmol/L   Potassium 3.7 3.5 - 5.1 mmol/L   Chloride 107 98 - 111 mmol/L   CO2 25 22 - 32 mmol/L   Glucose, Bld 200 (H) 70 - 99 mg/dL   BUN 15 8 - 23 mg/dL   Creatinine, Ser 0.86  0.61 - 1.24 mg/dL   Calcium 8.3 (L) 8.9 - 10.3 mg/dL   GFR calc non Af Amer >60 >60 mL/min   GFR calc Af Amer >60 >60 mL/min   Anion gap 6 5 - 15    Comment: Performed at [facility], [place] [address], [place], [place] [address]  CBC     Status: Abnormal   Collection Time: 02/23/19  5:58 AM  Result Value Ref Range   WBC 8.6 4.0 - 10.5 K/uL   RBC 4.07 (L) 4.22 - 5.81 MIL/uL   Hemoglobin 10.9 (L) 13.0 - 17.0 g/dL   HCT 35.5 (L) 39.0 - 52.0 %   MCV 87.2 80.0 - 100.0 fL   MCH 26.8 26.0 - 34.0 pg   MCHC 30.7 30.0 - 36.0 g/dL   RDW 13.8 11.5 - 15.5 %   Platelets 169 150 - 400 K/uL   nRBC 0.0 0.0 - 0.2 %    Comment: Performed at [facility], [place] [address] [place], [place] [address]  Magnesium     Status: None   Collection Time: 02/23/19  5:58 AM  Result Value Ref Range   Magnesium 1.9 1.7 - 2.4 mg/dL    Comment: Performed at [facility], [place] [address] [place], [place] [address]  Glucose, capillary     Status: Abnormal   Collection Time: 02/23/19  7:35 AM  Result Value Ref Range   Glucose-Capillary 153 (H) 70 - 99 mg/dL  Glucose, capillary     Status: Abnormal   Collection Time: 02/23/19 12:11 PM  Result Value Ref Range   Glucose-Capillary 213 (H) 70 - 99 mg/dL      Component Value Date/Time   SDES  02/21/2019 0626    BLOOD RIGHT HAND Performed at [facility], [place] [address] [place], [place] [address]    SDES  02/21/2019 [phone]    BLOOD LEFT FOREARM Performed at [facility], [place] [address], [place], [place] [address]    SDES  02/21/2019 7106    URINE, CLEAN CATCH Performed at [facility], [place] [address], [place], [place] [address]    SPECREQUEST  02/21/2019 [id number]    BOTTLES DRAWN AEROBIC AND ANAEROBIC Blood Culture results may not be optimal due to an excessive volume of blood received in culture bottles Performed at [facility], [place] [address], [place], [place] [address]    SPECREQUEST  02/21/2019 [id number]    BOTTLES DRAWN AEROBIC AND ANAEROBIC Blood Culture results may not be optimal due to an excessive volume of blood received in culture bottles Performed at [facility], [place] [address] [place],  [address]    SPECREQUEST  02/21/2019 3716    NONE Performed at [facility], [place] [address], [place], [place] [address]    CULT STAPHYLOCOCCUS EPIDERMIDIS (A) 02/21/2019 0626   CULT STAPHYLOCOCCUS EPIDERMIDIS (A) 02/21/2019 0626   CULT >=100,000 COLONIES/mL STAPHYLOCOCCUS EPIDERMIDIS (A) 02/21/2019 0626   REPTSTATUS PENDING 02/21/2019 0626   REPTSTATUS PENDING 02/21/2019 0626   REPTSTATUS 02/23/2019 FINAL 02/21/2019 0626   No results found. Recent Results (from the past 240 hour(s))  Culture, blood (Routine x 2)  Status: Abnormal (Preliminary result)   Collection Time: 02/21/19  6:26 AM  Result Value Ref Range Status   Specimen Description   Final    BLOOD RIGHT HAND Performed at [place] [address] [place], [place] [address]    Special Requests   Final    BOTTLES DRAWN AEROBIC AND ANAEROBIC Blood Culture results may not be optimal due to an excessive volume of blood received in culture bottles Performed at [place] [address], [place], [place] [address]    Culture  Setup Time   Final    GRAM POSITIVE COCCI CRITICAL RESULT CALLED TO, READ BACK BY AND VERIFIED WITH: Elenore Paddy PharmD 16:15 02/22/19 ([email or web address]) Performed at [facility], [place] [address] [place], [place] [address]    Culture STAPHYLOCOCCUS EPIDERMIDIS (A)  Final   Report Status PENDING  Incomplete  Culture, blood (Routine x 2)     Status: Abnormal (Preliminary result)   Collection Time: 02/21/19  6:26 AM  Result Value Ref Range Status   Specimen Description   Final    BLOOD LEFT FOREARM Performed at [facility], [place] [address],  [place], [place] [address]    Special Requests   Final    BOTTLES DRAWN AEROBIC AND ANAEROBIC Blood Culture results may not be optimal due to an excessive volume of blood received in culture bottles Performed at [place] [address] [place], [place] [address]    Culture  Setup Time   Final    GRAM POSITIVE COCCI CRITICAL VALUE NOTED.  VALUE IS CONSISTENT WITH PREVIOUSLY REPORTED AND CALLED VALUE. Performed at [facility], [place] [address] [place], [place] [address]    Culture STAPHYLOCOCCUS EPIDERMIDIS (A)  Final   Report Status PENDING  Incomplete  Urine culture     Status: Abnormal   Collection Time: 02/21/19  6:26 AM  Result Value Ref Range Status   Specimen Description   Final    URINE, CLEAN CATCH Performed at [facility], [place] [address] [place], [place] [address]    Special Requests   Final    NONE Performed at [facility], [place] [address] [place],  [address]    Culture >=100,000 COLONIES/mL STAPHYLOCOCCUS EPIDERMIDIS (A)  Final   Report Status 02/23/2019 FINAL  Final   Organism ID, Bacteria STAPHYLOCOCCUS EPIDERMIDIS (A)  Final      Susceptibility   Staphylococcus epidermidis - MIC*    CIPROFLOXACIN >=8 RESISTANT Resistant     GENTAMICIN >=16 RESISTANT Resistant     NITROFURANTOIN <=16 SENSITIVE Sensitive     OXACILLIN >=4 RESISTANT Resistant     TETRACYCLINE 2 SENSITIVE Sensitive     VANCOMYCIN 1 SENSITIVE Sensitive     TRIMETH/SULFA 80 RESISTANT Resistant     CLINDAMYCIN >=8 RESISTANT Resistant     RIFAMPIN <=0.5 SENSITIVE Sensitive     Inducible Clindamycin NEGATIVE Sensitive     * >=100,000 COLONIES/mL STAPHYLOCOCCUS EPIDERMIDIS  Blood Culture ID Panel (Reflexed)     Status: Abnormal   Collection Time: 02/21/19  6:26 AM  Result Value Ref Range Status   Enterococcus species NOT DETECTED NOT DETECTED Final   Listeria monocytogenes NOT DETECTED NOT DETECTED Final   Staphylococcus species  DETECTED (A) NOT DETECTED Final    Comment: Methicillin (oxacillin) resistant coagulase negative staphylococcus. Possible blood culture contaminant (unless isolated from more than one blood culture draw or clinical case suggests pathogenicity). No antibiotic treatment  is indicated for blood  culture contaminants. CRITICAL RESULT CALLED TO, READ BACK BY AND VERIFIED WITH: Elenore Paddy PharmD 16:15 02/22/19 ([email or web address])    Staphylococcus aureus (BCID) NOT DETECTED NOT DETECTED Final   Methicillin resistance DETECTED (A) NOT DETECTED Final    Comment: CRITICAL RESULT CALLED TO, READ BACK BY AND VERIFIED WITH: Elenore Paddy PharmD 16:15 02/22/19 ([email or web address])    Streptococcus species NOT DETECTED NOT DETECTED Final   Streptococcus agalactiae NOT DETECTED NOT DETECTED Final   Streptococcus pneumoniae NOT DETECTED NOT DETECTED Final   Streptococcus pyogenes NOT DETECTED NOT DETECTED Final   Acinetobacter baumannii NOT DETECTED NOT DETECTED Final   Enterobacteriaceae species NOT DETECTED NOT DETECTED Final   Enterobacter cloacae complex NOT DETECTED NOT DETECTED Final   Escherichia coli NOT DETECTED NOT DETECTED Final   Klebsiella oxytoca NOT DETECTED NOT DETECTED Final   Klebsiella pneumoniae NOT DETECTED NOT DETECTED Final   Proteus species NOT DETECTED NOT DETECTED Final   Serratia marcescens NOT DETECTED NOT DETECTED Final   Haemophilus influenzae NOT DETECTED NOT DETECTED Final   Neisseria meningitidis NOT DETECTED NOT DETECTED Final   Pseudomonas aeruginosa NOT DETECTED NOT DETECTED Final   Candida albicans NOT DETECTED NOT DETECTED Final   Candida glabrata NOT DETECTED NOT DETECTED Final   Candida krusei NOT DETECTED NOT DETECTED Final   Candida parapsilosis NOT DETECTED NOT DETECTED Final   Candida tropicalis NOT DETECTED NOT DETECTED Final    Comment: Performed at [facility], [address] [place], [place] [address]    Microbiology: Recent Results (from the past 240 hour(s))   Culture, blood (Routine x 2)     Status: Abnormal (Preliminary result)   Collection Time: 02/21/19  6:26 AM  Result Value Ref Range Status   Specimen Description   Final    BLOOD RIGHT HAND Performed at [place] [address] [place], [place] [address]    Special Requests   Final    BOTTLES DRAWN AEROBIC AND ANAEROBIC Blood Culture results may not be optimal due to an excessive volume of blood received in culture bottles Performed at [place] [address] [place], [place] [address]    Culture  Setup Time   Final    GRAM POSITIVE COCCI CRITICAL RESULT CALLED TO, READ BACK BY AND VERIFIED WITH: Elenore Paddy PharmD 16:15 02/22/19 ([email or web address]) Performed at [facility], [place] [address] [place], [place] [address]    Culture STAPHYLOCOCCUS EPIDERMIDIS (A)  Final   Report Status PENDING  Incomplete  Culture, blood (Routine x 2)     Status: Abnormal (Preliminary result)   Collection Time: 02/21/19  6:26 AM  Result Value Ref Range Status   Specimen Description   Final    BLOOD LEFT FOREARM Performed at [facility], [place] [address], [place], [place] [address]    Special Requests   Final    BOTTLES DRAWN AEROBIC AND ANAEROBIC Blood Culture results may not be optimal due to an excessive volume of blood received in culture bottles Performed at [place] [address], [place], [place] [address]    Culture  Setup Time   Final    GRAM POSITIVE COCCI CRITICAL VALUE NOTED.  VALUE IS CONSISTENT WITH PREVIOUSLY REPORTED AND CALLED VALUE. Performed at [facility], [place] [address], [place], [place] [address]    Culture STAPHYLOCOCCUS EPIDERMIDIS (A)  Final   Report Status PENDING  Incomplete  Urine culture  Status: Abnormal   Collection Time: 02/21/19  6:26 AM  Result Value Ref Range Status   Specimen Description   Final    URINE, CLEAN CATCH Performed at [facility], [place]  [address] [place], [place] [address]    Special Requests   Final    NONE Performed at [facility], [place] [address], [place], [place] [address]    Culture >=100,000 COLONIES/mL STAPHYLOCOCCUS EPIDERMIDIS (A)  Final   Report Status 02/23/2019 FINAL  Final   Organism ID, Bacteria STAPHYLOCOCCUS EPIDERMIDIS (A)  Final      Susceptibility   Staphylococcus epidermidis - MIC*    CIPROFLOXACIN >=8 RESISTANT Resistant     GENTAMICIN >=16 RESISTANT Resistant     NITROFURANTOIN <=16 SENSITIVE Sensitive     OXACILLIN >=4 RESISTANT Resistant     TETRACYCLINE 2 SENSITIVE Sensitive     VANCOMYCIN 1 SENSITIVE Sensitive     TRIMETH/SULFA 80 RESISTANT Resistant     CLINDAMYCIN >=8 RESISTANT Resistant     RIFAMPIN <=0.5 SENSITIVE Sensitive     Inducible Clindamycin NEGATIVE Sensitive     * >=100,000 COLONIES/mL STAPHYLOCOCCUS EPIDERMIDIS  Blood Culture ID Panel (Reflexed)     Status: Abnormal   Collection Time: 02/21/19  6:26 AM  Result Value Ref Range Status   Enterococcus species NOT DETECTED NOT DETECTED Final   Listeria monocytogenes NOT DETECTED NOT DETECTED Final   Staphylococcus species DETECTED (A) NOT DETECTED Final    Comment: Methicillin (oxacillin) resistant coagulase negative staphylococcus. Possible blood culture contaminant (unless isolated from more than one blood culture draw or clinical case suggests pathogenicity). No antibiotic treatment is indicated for blood  culture contaminants. CRITICAL RESULT CALLED TO, READ BACK BY AND VERIFIED WITH: Elenore Paddy PharmD 16:15 02/22/19 (wilsonm)    Staphylococcus aureus (BCID) NOT DETECTED NOT DETECTED Final   Methicillin resistance DETECTED (A) NOT DETECTED Final    Comment: CRITICAL RESULT CALLED TO, READ BACK BY AND VERIFIED WITH: Elenore Paddy PharmD 16:15 02/22/19 (wilsonm)    Streptococcus species NOT DETECTED NOT DETECTED Final   Streptococcus agalactiae NOT DETECTED NOT DETECTED Final   Streptococcus pneumoniae  NOT DETECTED NOT DETECTED Final   Streptococcus pyogenes NOT DETECTED NOT DETECTED Final   Acinetobacter baumannii NOT DETECTED NOT DETECTED Final   Enterobacteriaceae species NOT DETECTED NOT DETECTED Final   Enterobacter cloacae complex NOT DETECTED NOT DETECTED Final   Escherichia coli NOT DETECTED NOT DETECTED Final   Klebsiella oxytoca NOT DETECTED NOT DETECTED Final   Klebsiella pneumoniae NOT DETECTED NOT DETECTED Final   Proteus species NOT DETECTED NOT DETECTED Final   Serratia marcescens NOT DETECTED NOT DETECTED Final   Haemophilus influenzae NOT DETECTED NOT DETECTED Final   Neisseria meningitidis NOT DETECTED NOT DETECTED Final   Pseudomonas aeruginosa NOT DETECTED NOT DETECTED Final   Candida albicans NOT DETECTED NOT DETECTED Final   Candida glabrata NOT DETECTED NOT DETECTED Final   Candida krusei NOT DETECTED NOT DETECTED Final   Candida parapsilosis NOT DETECTED NOT DETECTED Final   Candida tropicalis NOT DETECTED NOT DETECTED Final    Comment: Performed at [facility], [address], [place], [place] [address]    Radiographs and labs were personally reviewed by me.   Bobby Rumpf, MD [facility] [place] [facility] [phone] 02/23/2019, 12:19 PM

## 2019-02-23 NOTE — Progress Notes (Signed)
PROGRESS NOTE    Wesley [name]  JOA:[id number] DOB: [birth year] DOA: 02/21/2019 PCP: [facility]   Brief Narrative:  63 year old with a history of AAA, anxiety, BPH, left renal pelvis carcinoma, COPD with emphysema, CAD, dementia, diabetes mellitus type 2, hyperlipidemia, obstructive sleep apnea, CVA with residual left-sided weakness, depression came to the hospital for evaluation of lethargy and confusion.  He was also found to have fevers and chills.  Recently had a left-sided ureteral stent placed on 02/18/2019.  Urology was consulted.  Admitted for pyelonephritis treatment.  Subjective:  Patient reports he is feeling better, but still reports generalized weakness, left abdominal pain, afebrile over last 24 hours.  Assessment & Plan:   Principal Problem:   Sepsis ([place]) Active Problems:   Cardiac pacemaker in situ   Essential hypertension   COPD mixed type ([place])   Tobacco use disorder   Anxiety and depression   BPH (benign prostatic hyperplasia)   History of substance abuse ([place])   SSS (sick sinus syndrome) ([facility])   History of stroke   Diabetes mellitus type 2 in obese ([facility])   Obstructive sleep apnea   Metabolic encephalopathy   Renal and perinephric abscess   Acute pyelonephritis  Sepsis secondary to acute pyelonephritis, collection, and Staphylococcus  epidermidis bacteremia - sepsis present prior to admission - CT of the abdomen pelvis is consistent with pyelonephritis with questionable surrounding fluid collection, suspicious for abscess . - some  concerns for perinephric abscess hemorrhage.  Seen by urology who is planning on continuing antibiotics and monitoring him.  Likely we will need repeat renal ultrasound once appropriate per renal -Empirically on vancomycin and cefepime, stop cefepime 02/23/2019 . -Continue Foley catheter, monitor urine output. -We will likely need repeat renal ultrasound in next 24-48 hours -Patient blood cultures growing resistant  Staphylococcus epidermidis, continue with IV vancomycin, I have discussed with ID, who will evaluate the patient, but the recommendation is to proceed with TEE, I will obtain TTE, and consult cardiology to arrange for TEE.   Essential hypertension - Continue Cardizem 180 mg daily.  Remains elevated, will start on PRN hydralazine  History of COPD, stable -Continue home bronchodilators or its equivalent here.  Currently stable  Diabetes mellitus type 2, insulin-dependent Peripheral neuropathy secondary diabetes mellitus type 2 -Continue Lantus 33 units daily, insulin sliding scale and Accu-Chek. -Continue gabapentin  Tobacco use -Nicotine patch if necessary  History of depression and substance abuse -Resume home medications  History of sick sinus syndrome with pacemaker in place - On Cardizem.  Continue to monitor.  History of seizure -Continue home medications including Keppra  History of CVA with mild left-sided residual defect, nonhemorrhagic -Continue aspirin and statin  BPH -On Flomax and finasteride  DVT prophylaxis: SCDs, will start subcu Lovenox next 24 hours as long as no  hematuria Code Status: Full code Family Communication: Wife at bedside Disposition Plan: Mains on IV antibiotics with need of further work-up  Consultants:   Urology  Procedures:   None  Antimicrobials:   Aztreonam stopped 3/10, Flagyl stopped 3/10  Vancomycin 3/9 >  Cefepime 3/10>3/11   Objective: Vitals:   02/22/19 2123 02/23/19 0447 02/23/19 0918 02/23/19 1046  BP: (!) 155/73 (!) 166/89  (!) 163/81  Pulse: (!) 59 71  65  Resp: 16 16    Temp: 99.3 F (37.4 C) 98.9 F (37.2 C)    TempSrc: Oral Oral    SpO2: 91% 91% 93% 95%  Weight:      Height:  Intake/Output Summary (Last 24 hours) at 02/23/2019 1124 Last data filed at 02/23/2019 0452 Gross per 24 hour  Intake 2753.66 ml  Output 2800 ml  Net -46.34 ml   Filed Weights   02/21/19 0615  Weight: 97.5 kg     Examination:  Awake Alert, Oriented X 3, No new F.N deficits, Normal affect Symmetrical Chest wall movement, Good air movement bilaterally, CTAB RRR,No Gallops,Rubs or new Murmurs, No Parasternal Heave +ve B.Sounds, Abd Soft, mild left abdomen tenderness, no rebound - guarding or rigidity. No Cyanosis, Clubbing or edema, No new Rash or bruise    Foley catheter in place with clear color urine   Data Reviewed:   CBC: Recent Labs  Lab 02/21/19 0626 02/22/19 0554 02/23/19 0558  WBC 11.6* 11.6* 8.6  NEUTROABS 8.3*  --   --   HGB 14.0 11.6* 10.9*  HCT 42.8 38.9* 35.5*  MCV 86.1 88.8 87.2  PLT 205 177 277   Basic Metabolic Panel: Recent Labs  Lab 02/21/19 0626 02/22/19 0554 02/23/19 0558  NA 137 139 138  K 3.7 3.7 3.7  CL 102 106 107  CO2 25 24 25   GLUCOSE 286* 166* 200*  BUN 16 18 15   CREATININE 1.04 0.80 0.86  CALCIUM 9.1 8.0* 8.3*  MG  --   --  1.9   GFR: Estimated Creatinine Clearance: 103.5 mL/min (by C-G formula based on SCr of 0.86 mg/dL). Liver Function Tests: Recent Labs  Lab 02/21/19 0626  AST 10*  ALT 25  ALKPHOS 137*  BILITOT 1.0  PROT 7.6  ALBUMIN 4.0   Recent Labs  Lab 02/21/19 0626  LIPASE 27   No results for input(s): AMMONIA in the last 168 hours. Coagulation Profile: Recent Labs  Lab 02/21/19 0626  INR 1.0   Cardiac Enzymes: No results for input(s): CKTOTAL, CKMB, CKMBINDEX, TROPONINI in the last 168 hours. BNP (last 3 results) No results for input(s): PROBNP in the last 8760 hours. HbA1C: No results for input(s): HGBA1C in the last 72 hours. CBG: Recent Labs  Lab 02/21/19 2156 02/22/19 1132 02/22/19 1624 02/22/19 2126 02/23/19 0735  GLUCAP 155* 230* 249* 307* 153*   Lipid Profile: No results for input(s): CHOL, HDL, LDLCALC, TRIG, CHOLHDL, LDLDIRECT in the last 72 hours. Thyroid Function Tests: No results for input(s): TSH, T4TOTAL, FREET4, T3FREE, THYROIDAB in the last 72 hours. Anemia Panel: No results for  input(s): VITAMINB12, FOLATE, FERRITIN, TIBC, IRON, RETICCTPCT in the last 72 hours. Sepsis Labs: Recent Labs  Lab 02/21/19 0626 02/21/19 0800  LATICACIDVEN 1.1 1.0    Recent Results (from the past 240 hour(s))  Culture, blood (Routine x 2)     Status: Abnormal (Preliminary result)   Collection Time: 02/21/19  6:26 AM  Result Value Ref Range Status   Specimen Description   Final    BLOOD RIGHT HAND Performed at [facility] [address], [place], [place] [address]    Special Requests   Final    BOTTLES DRAWN AEROBIC AND ANAEROBIC Blood Culture results may not be optimal due to an excessive volume of blood received in culture bottles Performed at [facility] [address], [place], [place] [address]    Culture  Setup Time   Final    GRAM POSITIVE COCCI CRITICAL RESULT CALLED TO, READ BACK BY AND VERIFIED WITH: Elenore Paddy PharmD 16:15 02/22/19 ([email or web address]) Performed at [facility], [place] [address] [place], [place] [address]    Culture STAPHYLOCOCCUS EPIDERMIDIS (A)  Final   Report Status PENDING  Incomplete  Culture, blood (Routine x 2)     Status: Abnormal (Preliminary result)   Collection Time: 02/21/19  6:26 AM  Result Value Ref Range Status   Specimen Description   Final    BLOOD LEFT FOREARM Performed at [facility], [place] [address] [place], [place] [address]    Special Requests   Final    BOTTLES DRAWN AEROBIC AND ANAEROBIC Blood Culture results may not be optimal due to an excessive volume of blood received in culture bottles Performed at [facility] [address] [place], [place] [address]    Culture  Setup Time   Final    GRAM POSITIVE COCCI CRITICAL VALUE NOTED.  VALUE IS CONSISTENT WITH PREVIOUSLY REPORTED AND CALLED VALUE. Performed at [facility], [place] [address] [place], [place] [address]    Culture STAPHYLOCOCCUS EPIDERMIDIS (A)  Final   Report Status PENDING  Incomplete   Urine culture     Status: Abnormal   Collection Time: 02/21/19  6:26 AM  Result Value Ref Range Status   Specimen Description   Final    URINE, CLEAN CATCH Performed at [facility], [place] [address], [place], [place] [address]    Special Requests   Final    NONE Performed at [facility], [place] [address] [place],  [address]    Culture >=100,000 COLONIES/mL STAPHYLOCOCCUS EPIDERMIDIS (A)  Final   Report Status 02/23/2019 FINAL  Final   Organism ID, Bacteria STAPHYLOCOCCUS EPIDERMIDIS (A)  Final      Susceptibility   Staphylococcus epidermidis - MIC*    CIPROFLOXACIN >=8 RESISTANT Resistant     GENTAMICIN >=16 RESISTANT Resistant     NITROFURANTOIN <=16 SENSITIVE Sensitive     OXACILLIN >=4 RESISTANT Resistant     TETRACYCLINE 2 SENSITIVE Sensitive     VANCOMYCIN 1 SENSITIVE Sensitive     TRIMETH/SULFA 80 RESISTANT Resistant     CLINDAMYCIN >=8 RESISTANT Resistant     RIFAMPIN <=0.5 SENSITIVE Sensitive     Inducible Clindamycin NEGATIVE Sensitive     * >=100,000 COLONIES/mL STAPHYLOCOCCUS EPIDERMIDIS  Blood Culture ID Panel (Reflexed)     Status: Abnormal   Collection Time: 02/21/19  6:26 AM  Result Value Ref Range Status   Enterococcus species NOT DETECTED NOT DETECTED Final   Listeria monocytogenes NOT DETECTED NOT DETECTED Final   Staphylococcus species DETECTED (A) NOT DETECTED Final    Comment: Methicillin (oxacillin) resistant coagulase negative staphylococcus. Possible blood culture contaminant (unless isolated from more than one blood culture draw or clinical case suggests pathogenicity). No antibiotic treatment is indicated for blood  culture contaminants. CRITICAL RESULT CALLED TO, READ BACK BY AND VERIFIED WITH: Elenore Paddy PharmD 16:15 02/22/19 ([email or web address])    Staphylococcus aureus (BCID) NOT DETECTED NOT DETECTED Final   Methicillin resistance DETECTED (A) NOT DETECTED Final    Comment: CRITICAL RESULT CALLED TO, READ BACK  BY AND VERIFIED WITH: Elenore Paddy PharmD 16:15 02/22/19 ([email or web address])    Streptococcus species NOT DETECTED NOT DETECTED Final   Streptococcus agalactiae NOT DETECTED NOT DETECTED Final   Streptococcus pneumoniae NOT DETECTED NOT DETECTED Final   Streptococcus pyogenes NOT DETECTED NOT DETECTED Final   Acinetobacter baumannii NOT DETECTED NOT DETECTED Final   Enterobacteriaceae species NOT DETECTED NOT DETECTED Final   Enterobacter cloacae complex NOT DETECTED NOT DETECTED Final   Escherichia coli NOT DETECTED NOT DETECTED Final   Klebsiella oxytoca NOT DETECTED NOT  DETECTED Final   Klebsiella pneumoniae NOT DETECTED NOT DETECTED Final   Proteus species NOT DETECTED NOT DETECTED Final   Serratia marcescens NOT DETECTED NOT DETECTED Final   Haemophilus influenzae NOT DETECTED NOT DETECTED Final   Neisseria meningitidis NOT DETECTED NOT DETECTED Final   Pseudomonas aeruginosa NOT DETECTED NOT DETECTED Final   Candida albicans NOT DETECTED NOT DETECTED Final   Candida glabrata NOT DETECTED NOT DETECTED Final   Candida krusei NOT DETECTED NOT DETECTED Final   Candida parapsilosis NOT DETECTED NOT DETECTED Final   Candida tropicalis NOT DETECTED NOT DETECTED Final    Comment: Performed at [facility], [place] [address] [place], [place] [address]         Radiology Studies: No results found.      Scheduled Meds: . albuterol  2.5 mg Nebulization BID  . arformoterol  15 mcg Nebulization BID   And  . umeclidinium bromide  1 puff Inhalation Daily  . aspirin  325 mg Oral Daily  . atorvastatin  40 mg Oral QHS  . brimonidine  1 drop Both Eyes QHS  . budesonide  0.25 mg Nebulization BID  . cholecalciferol  2,000 Units Oral Daily  . diltiazem  180 mg Oral Daily  . feeding supplement (ENSURE ENLIVE)  237 mL Oral Q24H  . finasteride  5 mg Oral Daily  . gabapentin  300 mg Oral TID  . insulin aspart  0-20 Units Subcutaneous TID WC  . insulin aspart  0-5 Units Subcutaneous QHS  .  insulin glargine  33 Units Subcutaneous QHS  . latanoprost  1 drop Both Eyes QHS  . levETIRAcetam  750 mg Oral BID  . loratadine  10 mg Oral Daily  . lurasidone  20 mg Oral QHS  . mirabegron ER  25 mg Oral Daily  . mirtazapine  7.5 mg Oral QHS  . nicotine  14 mg Transdermal Daily  . pantoprazole  40 mg Oral BID AC  . polyethylene glycol  17 g Oral Daily  . polyvinyl alcohol  1 drop Both Eyes QHS  . senna  1 tablet Oral QHS  . tamsulosin  0.4 mg Oral QPC supper  . venlafaxine XR  150 mg Oral Q breakfast   Continuous Infusions: . sodium chloride Stopped (02/23/19 0011)  . vancomycin 250 mL/hr at 02/23/19 0200     LOS: 2 days      Phillips Climes, MD [facility]  If 7PM-7AM, please contact night-coverage [email or web address] 02/23/2019, 11:24 AM

## 2019-02-24 ENCOUNTER — Inpatient Hospital Stay (HOSPITAL_COMMUNITY): Payer: No Typology Code available for payment source

## 2019-02-24 DIAGNOSIS — R7881 Bacteremia: Secondary | ICD-10-CM

## 2019-02-24 DIAGNOSIS — B9562 Methicillin resistant Staphylococcus aureus infection as the cause of diseases classified elsewhere: Secondary | ICD-10-CM

## 2019-02-24 DIAGNOSIS — Z960 Presence of urogenital implants: Secondary | ICD-10-CM

## 2019-02-24 DIAGNOSIS — E1169 Type 2 diabetes mellitus with other specified complication: Secondary | ICD-10-CM

## 2019-02-24 DIAGNOSIS — E669 Obesity, unspecified: Secondary | ICD-10-CM

## 2019-02-24 DIAGNOSIS — D49512 Neoplasm of unspecified behavior of left kidney: Secondary | ICD-10-CM

## 2019-02-24 DIAGNOSIS — E118 Type 2 diabetes mellitus with unspecified complications: Secondary | ICD-10-CM

## 2019-02-24 DIAGNOSIS — I639 Cerebral infarction, unspecified: Secondary | ICD-10-CM

## 2019-02-24 DIAGNOSIS — I252 Old myocardial infarction: Secondary | ICD-10-CM

## 2019-02-24 LAB — BASIC METABOLIC PANEL
Anion gap: 6 (ref 5–15)
BUN: 13 mg/dL (ref 8–23)
CO2: 25 mmol/L (ref 22–32)
Calcium: 8.6 mg/dL — ABNORMAL LOW (ref 8.9–10.3)
Chloride: 109 mmol/L (ref 98–111)
Creatinine, Ser: 0.75 mg/dL (ref 0.61–1.24)
GFR calc Af Amer: >60 mL/min (ref >60)
GFR calc non Af Amer: >60 mL/min (ref >60)
Glucose, Bld: 154 mg/dL — ABNORMAL HIGH (ref 70–99)
Potassium: 3.6 mmol/L (ref 3.5–5.1)
Sodium: 140 mmol/L (ref 135–145)

## 2019-02-24 LAB — CULTURE, BLOOD (ROUTINE X 2)

## 2019-02-24 LAB — GLUCOSE, CAPILLARY
GLUCOSE-CAPILLARY: 268 mg/dL — AB (ref 70–99)
Glucose-Capillary: 118 mg/dL — ABNORMAL HIGH (ref 70–99)
Glucose-Capillary: 163 mg/dL — ABNORMAL HIGH (ref 70–99)
Glucose-Capillary: 204 mg/dL — ABNORMAL HIGH (ref 70–99)

## 2019-02-24 LAB — ECHOCARDIOGRAM COMPLETE
Height: 74 in
Weight: 3440 oz

## 2019-02-24 LAB — CBC
HCT: 36.1 % — ABNORMAL LOW (ref 39.0–52.0)
Hemoglobin: 11.1 g/dL — ABNORMAL LOW (ref 13.0–17.0)
MCH: 26.5 pg (ref 26.0–34.0)
MCHC: 30.7 g/dL (ref 30.0–36.0)
MCV: 86.2 fL (ref 80.0–100.0)
Platelets: 193 10*3/uL (ref 150–400)
RBC: 4.19 MIL/uL — ABNORMAL LOW (ref 4.22–5.81)
RDW: 13.8 % (ref 11.5–15.5)
WBC: 7 10*3/uL (ref 4.0–10.5)
nRBC: 0 % (ref 0.0–0.2)

## 2019-02-24 LAB — MAGNESIUM: Magnesium: 1.9 mg/dL (ref 1.7–2.4)

## 2019-02-24 MED ORDER — ENOXAPARIN SODIUM 40 MG/0.4ML ~~LOC~~ SOLN
40.0000 mg | SUBCUTANEOUS | Status: DC
  Administered 2019-02-25 – 2019-02-28 (×4): 40 mg via SUBCUTANEOUS
  Filled 2019-02-24 (×4): qty 0.4

## 2019-02-24 MED ORDER — SENNOSIDES-DOCUSATE SODIUM 8.6-50 MG PO TABS
3.0000 | ORAL_TABLET | Freq: Two times a day (BID) | ORAL | Status: DC
  Administered 2019-02-24 – 2019-02-28 (×10): 3 via ORAL
  Filled 2019-02-24 (×10): qty 3

## 2019-02-24 MED ORDER — POLYETHYLENE GLYCOL 3350 17 G PO PACK
34.0000 g | PACK | Freq: Once | ORAL | Status: AC
  Administered 2019-02-24: 34 g via ORAL
  Filled 2019-02-24: qty 2

## 2019-02-24 NOTE — Plan of Care (Signed)
  Problem: Clinical Measurements: Goal: Ability to maintain clinical measurements within normal limits will improve Outcome: Progressing Goal: Diagnostic test results will improve Outcome: Progressing   Problem: Education: Goal: Knowledge of General Education information will improve Description: Including pain rating scale, medication(s)/side effects and non-pharmacologic comfort measures Outcome: Adequate for Discharge   Problem: Health Behavior/Discharge Planning: Goal: Ability to manage health-related needs will improve Outcome: Adequate for Discharge   Problem: Clinical Measurements: Goal: Will remain free from infection Outcome: Adequate for Discharge Goal: Respiratory complications will improve Outcome: Adequate for Discharge Goal: Cardiovascular complication will be avoided Outcome: Adequate for Discharge   Problem: Activity: Goal: Risk for activity intolerance will decrease Outcome: Adequate for Discharge   Problem: Nutrition: Goal: Adequate nutrition will be maintained Outcome: Adequate for Discharge   Problem: Coping: Goal: Level of anxiety will decrease Outcome: Adequate for Discharge   Problem: Elimination: Goal: Will not experience complications related to bowel motility Outcome: Adequate for Discharge Goal: Will not experience complications related to urinary retention Outcome: Adequate for Discharge   Problem: Pain Managment: Goal: General experience of comfort will improve Outcome: Adequate for Discharge   Problem: Safety: Goal: Ability to remain free from injury will improve Outcome: Adequate for Discharge   Problem: Skin Integrity: Goal: Risk for impaired skin integrity will decrease Outcome: Adequate for Discharge   

## 2019-02-24 NOTE — H&P (View-Only) (Signed)
    [facility] has been requested to perform a transesophageal echocardiogram on 02/25/2019 for Bacteremia.  After careful review of history and examination, the risks and benefits of transesophageal echocardiogram have been explained including risks of esophageal damage, perforation (1:10,000 risk), bleeding, pharyngeal hematoma as well as other potential complications associated with conscious sedation including aspiration, arrhythmia, respiratory failure and death. Alternatives to treatment were discussed, questions were answered. Patient is willing to proceed. This was discussed with patient over the phone.   Reino Bellis, NP-C 02/24/2019 5:01 PM

## 2019-02-24 NOTE — Progress Notes (Signed)
INFECTIOUS DISEASE PROGRESS NOTE  ID: Wesley [name] is a 63 y.o. male with  Principal Problem:   Sepsis ([place]) Active Problems:   Cardiac pacemaker in situ   Essential hypertension   COPD mixed type ([place])   Tobacco use disorder   Anxiety and depression   BPH (benign prostatic hyperplasia)   History of substance abuse ([place])   SSS (sick sinus syndrome) ([place])   History of stroke   Diabetes mellitus type 2 in obese ([place])   Obstructive sleep apnea   Metabolic encephalopathy   Renal and perinephric abscess   Acute pyelonephritis  Subjective: No complaints.   Abtx:  Anti-infectives (From admission, onward)   Start     Dose/Rate Route Frequency Ordered Stop   02/23/19 1600  rifampin (RIFADIN) capsule 300 mg  Status:  Discontinued     300 mg Oral Every 12 hours 02/23/19 1238 02/23/19 1250   02/22/19 1200  vancomycin (VANCOCIN) 1,750 mg in sodium chloride 0.9 % 500 mL IVPB     1,750 mg 250 mL/hr over 120 Minutes Intravenous Every 12 hours 02/22/19 1054     02/21/19 1600  ceFEPIme (MAXIPIME) 2 g in sodium chloride 0.9 % 100 mL IVPB  Status:  Discontinued     2 g 200 mL/hr over 30 Minutes Intravenous Every 8 hours 02/21/19 1223 02/23/19 1038   02/21/19 1400  aztreonam (AZACTAM) injection 2 g  Status:  Discontinued     2 g Intramuscular Every 8 hours 02/21/19 1036 02/21/19 1222   02/21/19 0715  vancomycin (VANCOCIN) 2,000 mg in sodium chloride 0.9 % 500 mL IVPB     2,000 mg 250 mL/hr over 120 Minutes Intravenous  Once 02/21/19 0659 02/22/19 1058   02/21/19 0715  aztreonam (AZACTAM) 2 g in sodium chloride 0.9 % 100 mL IVPB     2 g 200 mL/hr over 30 Minutes Intravenous STAT 02/21/19 0700 02/21/19 0947   02/21/19 0700  metroNIDAZOLE (FLAGYL) IVPB 500 mg  Status:  Discontinued     500 mg 100 mL/hr over 60 Minutes Intravenous Every 8 hours 02/21/19 0654 02/22/19 1004      Medications:  Scheduled:  albuterol  2.5 mg Nebulization BID   arformoterol  15 mcg Nebulization BID    And   umeclidinium bromide  1 puff Inhalation Daily   aspirin  325 mg Oral Daily   atorvastatin  40 mg Oral QHS   brimonidine  1 drop Both Eyes QHS   budesonide  0.25 mg Nebulization BID   cholecalciferol  2,000 Units Oral Daily   diltiazem  180 mg Oral Daily   enoxaparin (LOVENOX) injection  40 mg Subcutaneous Q24H   feeding supplement (ENSURE ENLIVE)  237 mL Oral Q24H   finasteride  5 mg Oral Daily   gabapentin  300 mg Oral TID   insulin aspart  0-20 Units Subcutaneous TID WC   insulin aspart  0-5 Units Subcutaneous QHS   insulin glargine  33 Units Subcutaneous QHS   latanoprost  1 drop Both Eyes QHS   levETIRAcetam  750 mg Oral BID   loratadine  10 mg Oral Daily   lurasidone  20 mg Oral QHS   mirabegron ER  25 mg Oral Daily   mirtazapine  7.5 mg Oral QHS   nicotine  14 mg Transdermal Daily   pantoprazole  40 mg Oral BID AC   polyethylene glycol  17 g Oral Daily   polyvinyl alcohol  1 drop Both Eyes QHS  senna-docusate  3 tablet Oral BID   tamsulosin  0.4 mg Oral QPC supper   venlafaxine XR  150 mg Oral Q breakfast    Objective: Vital signs in last 24 hours: Temp:  [98.3 F (36.8 C)-98.5 F (36.9 C)] 98.5 F (36.9 C) (03/12 1440) Pulse Rate:  [59-60] 59 (03/12 1440) Resp:  [17-20] 19 (03/12 1440) BP: (173-177)/(87-99) 177/87 (03/12 1440) SpO2:  [91 %-97 %] 91 % (03/12 1440)   General appearance: alert, cooperative and no distress Resp: clear to auscultation bilaterally Chest wall: no tenderness, pacer non-tender, no fluctuance.  Cardio: regular rate and rhythm GI: normal findings: bowel sounds normal and soft, non-tender  Lab Results Recent Labs    02/23/19 0558 02/24/19 0555  WBC 8.6 7.0  HGB 10.9* 11.1*  HCT 35.5* 36.1*  NA 138 140  K 3.7 3.6  CL 107 109  CO2 25 25  BUN 15 13  CREATININE 0.86 0.75   Liver Panel No results for input(s): PROT, ALBUMIN, AST, ALT, ALKPHOS, BILITOT, BILIDIR, IBILI in the last 72  hours. Sedimentation Rate No results for input(s): ESRSEDRATE in the last 72 hours. C-Reactive Protein No results for input(s): CRP in the last 72 hours.  Microbiology: Recent Results (from the past 240 hour(s))  Culture, blood (Routine x 2)     Status: Abnormal   Collection Time: 02/21/19  6:26 AM  Result Value Ref Range Status   Specimen Description   Final    BLOOD RIGHT HAND Performed at [facility] [address] [place], [place] [address]    Special Requests   Final    BOTTLES DRAWN AEROBIC AND ANAEROBIC Blood Culture results may not be optimal due to an excessive volume of blood received in culture bottles Performed at [facility] [address] [place], [place] [address]    Culture  Setup Time   Final    GRAM POSITIVE COCCI CRITICAL RESULT CALLED TO, READ BACK BY AND VERIFIED WITH: Elenore Paddy PharmD 16:15 02/22/19 ([email or web address]) AEROBIC BOTTLE ONLY Performed at [facility], [place] [address] [place], [place] [address]    Culture STAPHYLOCOCCUS EPIDERMIDIS (A)  Final   Report Status 02/24/2019 FINAL  Final   Organism ID, Bacteria STAPHYLOCOCCUS EPIDERMIDIS  Final      Susceptibility   Staphylococcus epidermidis - MIC*    CIPROFLOXACIN >=8 RESISTANT Resistant     ERYTHROMYCIN >=8 RESISTANT Resistant     GENTAMICIN >=16 RESISTANT Resistant     OXACILLIN >=4 RESISTANT Resistant     TETRACYCLINE 2 SENSITIVE Sensitive     VANCOMYCIN 1 SENSITIVE Sensitive     TRIMETH/SULFA 160 RESISTANT Resistant     CLINDAMYCIN >=8 RESISTANT Resistant     RIFAMPIN <=0.5 SENSITIVE Sensitive     Inducible Clindamycin NEGATIVE Sensitive     * STAPHYLOCOCCUS EPIDERMIDIS  Culture, blood (Routine x 2)     Status: Abnormal   Collection Time: 02/21/19  6:26 AM  Result Value Ref Range Status   Specimen Description   Final    BLOOD LEFT FOREARM Performed at [facility], [address] [place], [place] [address]    Special Requests   Final     BOTTLES DRAWN AEROBIC AND ANAEROBIC Blood Culture results may not be optimal due to an excessive volume of blood received in culture bottles Performed at [facility] [address] [place], [place] [address]    Culture  Setup Time   Final  GRAM POSITIVE COCCI CRITICAL VALUE NOTED.  VALUE IS CONSISTENT WITH PREVIOUSLY REPORTED AND CALLED VALUE. AEROBIC BOTTLE ONLY    Culture (A)  Final    STAPHYLOCOCCUS EPIDERMIDIS SUSCEPTIBILITIES PERFORMED ON PREVIOUS CULTURE WITHIN THE LAST 5 DAYS. Performed at [facility], [place] [address], [place], [place] [address]    Report Status 02/24/2019 FINAL  Final  Urine culture     Status: Abnormal   Collection Time: 02/21/19  6:26 AM  Result Value Ref Range Status   Specimen Description   Final    URINE, CLEAN CATCH Performed at [facility], [place] [address], [place], [place] [address]    Special Requests   Final    NONE Performed at [facility], [place] [address], [place],  [address]    Culture >=100,000 COLONIES/mL STAPHYLOCOCCUS EPIDERMIDIS (A)  Final   Report Status 02/23/2019 FINAL  Final   Organism ID, Bacteria STAPHYLOCOCCUS EPIDERMIDIS (A)  Final      Susceptibility   Staphylococcus epidermidis - MIC*    CIPROFLOXACIN >=8 RESISTANT Resistant     GENTAMICIN >=16 RESISTANT Resistant     NITROFURANTOIN <=16 SENSITIVE Sensitive     OXACILLIN >=4 RESISTANT Resistant     TETRACYCLINE 2 SENSITIVE Sensitive     VANCOMYCIN 1 SENSITIVE Sensitive     TRIMETH/SULFA 80 RESISTANT Resistant     CLINDAMYCIN >=8 RESISTANT Resistant     RIFAMPIN <=0.5 SENSITIVE Sensitive     Inducible Clindamycin NEGATIVE Sensitive     * >=100,000 COLONIES/mL STAPHYLOCOCCUS EPIDERMIDIS  Blood Culture ID Panel (Reflexed)     Status: Abnormal   Collection Time: 02/21/19  6:26 AM  Result Value Ref Range Status   Enterococcus species NOT DETECTED NOT DETECTED Final   Listeria monocytogenes NOT  DETECTED NOT DETECTED Final   Staphylococcus species DETECTED (A) NOT DETECTED Final    Comment: Methicillin (oxacillin) resistant coagulase negative staphylococcus. Possible blood culture contaminant (unless isolated from more than one blood culture draw or clinical case suggests pathogenicity). No antibiotic treatment is indicated for blood  culture contaminants. CRITICAL RESULT CALLED TO, READ BACK BY AND VERIFIED WITH: Elenore Paddy PharmD 16:15 02/22/19 ([email or web address])    Staphylococcus aureus (BCID) NOT DETECTED NOT DETECTED Final   Methicillin resistance DETECTED (A) NOT DETECTED Final    Comment: CRITICAL RESULT CALLED TO, READ BACK BY AND VERIFIED WITH: Elenore Paddy PharmD 16:15 02/22/19 ([email or web address])    Streptococcus species NOT DETECTED NOT DETECTED Final   Streptococcus agalactiae NOT DETECTED NOT DETECTED Final   Streptococcus pneumoniae NOT DETECTED NOT DETECTED Final   Streptococcus pyogenes NOT DETECTED NOT DETECTED Final   Acinetobacter baumannii NOT DETECTED NOT DETECTED Final   Enterobacteriaceae species NOT DETECTED NOT DETECTED Final   Enterobacter cloacae complex NOT DETECTED NOT DETECTED Final   Escherichia coli NOT DETECTED NOT DETECTED Final   Klebsiella oxytoca NOT DETECTED NOT DETECTED Final   Klebsiella pneumoniae NOT DETECTED NOT DETECTED Final   Proteus species NOT DETECTED NOT DETECTED Final   Serratia marcescens NOT DETECTED NOT DETECTED Final   Haemophilus influenzae NOT DETECTED NOT DETECTED Final   Neisseria meningitidis NOT DETECTED NOT DETECTED Final   Pseudomonas aeruginosa NOT DETECTED NOT DETECTED Final   Candida albicans NOT DETECTED NOT DETECTED Final   Candida glabrata NOT DETECTED NOT DETECTED Final   Candida krusei NOT DETECTED NOT DETECTED Final   Candida parapsilosis NOT DETECTED NOT DETECTED Final   Candida tropicalis NOT DETECTED NOT DETECTED Final    Comment:  Performed at [facility], [place] [address], [place], [place] [address]  Culture,  blood (Routine X 2) w Reflex to ID Panel     Status: None (Preliminary result)   Collection Time: 02/23/19 11:43 AM  Result Value Ref Range Status   Specimen Description   Final    BLOOD RIGHT ANTECUBITAL Performed at [facility] [address] [place], [place] [address]    Special Requests   Final    BOTTLES DRAWN AEROBIC AND ANAEROBIC Blood Culture adequate volume Performed at [facility] [address] [place], [place] [address]    Culture   Final    NO GROWTH 1 DAY Performed at [facility], [place] [address], [place], [place] [address]    Report Status PENDING  Incomplete  Culture, blood (Routine X 2) w Reflex to ID Panel     Status: None (Preliminary result)   Collection Time: 02/23/19 11:45 AM  Result Value Ref Range Status   Specimen Description   Final    BLOOD RIGHT ARM Performed at [facility] [address], [place], [place] [address]    Special Requests   Final    BOTTLES DRAWN AEROBIC AND ANAEROBIC Blood Culture adequate volume Performed at [facility] [address], [place], [place] [address]    Culture   Final    NO GROWTH 1 DAY Performed at [facility], [place] [address], [place], [place] [address]    Report Status PENDING  Incomplete    Studies/Results: No results found.   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  Uncontrolled DM (A1C 9.7%) Pacer, prev MI  Total days of antibiotics: 3 vanco  TEE pending tomorrow WBC normal. Cr normal.  Repeat BCx sent 3-11, ngtd.  Repeat renal u/s tomorrow to f/u fluid collection.  Appreciate pharm monitoring.          Bobby Rumpf MD, FACP Infectious Diseases (pager) [phone] [email or web address] 02/24/2019, 3:51 PM  LOS: 3 days

## 2019-02-24 NOTE — Addendum Note (Signed)
Addendum  created 02/24/19 1459 by Duane Boston, MD   Intraprocedure Staff edited

## 2019-02-24 NOTE — Progress Notes (Signed)
    [facility] has been requested to perform a transesophageal echocardiogram on 02/25/2019 for Bacteremia.  After careful review of history and examination, the risks and benefits of transesophageal echocardiogram have been explained including risks of esophageal damage, perforation (1:10,000 risk), bleeding, pharyngeal hematoma as well as other potential complications associated with conscious sedation including aspiration, arrhythmia, respiratory failure and death. Alternatives to treatment were discussed, questions were answered. Patient is willing to proceed. This was discussed with patient over the phone.   Reino Bellis, NP-C 02/24/2019 5:01 PM

## 2019-02-24 NOTE — Evaluation (Signed)
Occupational Therapy Evaluation Patient Details Name: Wesley [name] MRN: [id number] DOB: July 14, 1956 Today's Date: 02/24/2019    History of Present Illness 63 year old with a history of AAA, anxiety, BPH, left renal pelvis carcinoma, COPD with emphysema, CAD, dementia, diabetes mellitus type 2, hyperlipidemia, obstructive sleep apnea, CVA with residual left-sided weakness, depression came to the hospital for evaluation of lethargy and confusion.  He was also found to have fevers and chills.  Recently had a left-sided ureteral stent placed on 02/18/2019.  Urology was consulted.  Admitted for pyelonephritis treatment.   Clinical Impression   Pt admitted with the above diagnoses and presents with below problem list. Pt will benefit from continued acute OT to address the below listed deficits and maximize independence with basic ADLs prior to d/c back home. PTA pt was mod I - supervision with ADLs. He does endorse h/o falls due to syncope/near syncopal events. He is from home where he resides with his spouse. Pt is currently min guard with LB ADLs and functional transfers (toilet, tub shower). Of note, pt reports h/o issues with his RLE that causes his previous PT sessions to be put on hold about two months ago by his PCP.      Follow Up Recommendations  Home health OT;Supervision/Assistance - 24 hour    Equipment Recommendations  None recommended by OT    Recommendations for Other Services PT consult     Precautions / Restrictions Precautions Precautions: Fall Restrictions Weight Bearing Restrictions: No      Mobility Bed Mobility Overal bed mobility: Needs Assistance Bed Mobility: Sit to Supine       Sit to supine: Supervision   General bed mobility comments: supervision for safety  Transfers Overall transfer level: Needs assistance Equipment used: Rolling walker (2 wheeled) Transfers: Sit to/from Stand Sit to Stand: Supervision;Min guard         General transfer  comment: from recliner to EOB    Balance Overall balance assessment: Needs assistance   Sitting balance-Leahy Scale: Good     Standing balance support: Bilateral upper extremity supported;Single extremity supported Standing balance-Leahy Scale: Poor                             ADL either performed or assessed with clinical judgement   ADL Overall ADL's : Needs assistance/impaired Eating/Feeding: Set up;Sitting   Grooming: Set up;Sitting   Upper Body Bathing: Set up;Sitting   Lower Body Bathing: Min guard;Sit to/from stand   Upper Body Dressing : Set up;Sitting   Lower Body Dressing: Min guard;Sit to/from stand   Toilet Transfer: Min guard;Ambulation;RW   Toileting- Water quality scientist and Hygiene: Min guard;Sit to/from stand   Tub/ Shower Transfer: Tub transfer;Min guard;Ambulation;Shower seat;Rolling walker   Functional mobility during ADLs: Min guard;Rolling walker General ADL Comments: Pt completed in room mobility and bed mobility. Reports recently walked a bit in the halls.      Vision         Perception     Praxis      Pertinent Vitals/Pain Pain Assessment: Faces Faces Pain Scale: Hurts little more Pain Location: L flank Pain Descriptors / Indicators: Discomfort Pain Intervention(s): Monitored during session;Repositioned     Hand Dominance Right   Extremity/Trunk Assessment Upper Extremity Assessment Upper Extremity Assessment: Overall WFL for tasks assessed;Generalized weakness(h/o rotator cuff injuries B shoulders, arthritis)   Lower Extremity Assessment Lower Extremity Assessment: Defer to PT evaluation   Cervical / Trunk Assessment Cervical /  Trunk Assessment: Normal   Communication Communication Communication: No difficulties   Cognition Arousal/Alertness: Awake/alert Behavior During Therapy: WFL for tasks assessed/performed Overall Cognitive Status: Within Functional Limits for tasks assessed                                      General Comments  Some wooziness on initial stand, pt reports this is baseline for him    Exercises     Shoulder Instructions      Home Living Family/patient expects to be discharged to:: Private residence Living Arrangements: Spouse/significant other Available Help at Discharge: Family Type of Home: House Home Access: Stairs to enter Technical brewer of Steps: 3 Entrance Stairs-Rails: Right Home Layout: One level     Bathroom Shower/Tub: Teacher, early years/pre: [place]: Mining engineer - 4 wheels;Shower seat          Prior Functioning/Environment Level of Independence: Needs assistance  Gait / Transfers Assistance Needed: quad cane for short distances, rollator for longer distances (checking the mail) ADL's / Homemaking Assistance Needed: pt reports he does not need assist with b/d   Comments: h/o falls in the shower due to near/syncope        OT Problem List: Decreased strength;Decreased activity tolerance;Impaired balance (sitting and/or standing);Decreased knowledge of use of DME or AE;Decreased knowledge of precautions;Pain      OT Treatment/Interventions: Self-care/ADL training;Energy conservation;DME and/or AE instruction;Therapeutic activities;Patient/family education;Balance training    OT Goals(Current goals can be found in the care plan section) Acute Rehab OT Goals Patient Stated Goal: feel better, be independent OT Goal Formulation: With patient Time For Goal Achievement: 03/10/19 Potential to Achieve Goals: Good ADL Goals Pt Will Perform Grooming: with modified independence;standing Pt Will Perform Upper Body Bathing: with modified independence;sitting Pt Will Perform Lower Body Bathing: with modified independence;sit to/from stand Pt Will Perform Upper Body Dressing: with modified independence;sitting Pt Will Perform Lower Body Dressing: with modified independence;sit to/from  stand Pt Will Transfer to Toilet: with modified independence;ambulating Pt Will Perform Toileting - Clothing Manipulation and hygiene: with modified independence;sit to/from stand Pt Will Perform Tub/Shower Transfer: Tub transfer;with modified independence;shower seat;rolling walker  OT Frequency: Min 2X/week   Barriers to D/C:            Co-evaluation              AM-PAC OT "6 Clicks" Daily Activity     Outcome Measure Help from another person eating meals?: None Help from another person taking care of personal grooming?: None Help from another person toileting, which includes using toliet, bedpan, or urinal?: A Little Help from another person bathing (including washing, rinsing, drying)?: A Little Help from another person to put on and taking off regular upper body clothing?: None Help from another person to put on and taking off regular lower body clothing?: A Little 6 Click Score: 21   End of Session Equipment Utilized During Treatment: Rolling walker  Activity Tolerance: Patient tolerated treatment well;Patient limited by fatigue(recently walked in the halls a bit) Patient left: in bed;with call bell/phone within reach;with bed alarm set  OT Visit Diagnosis: Unsteadiness on feet (R26.81);Pain;Muscle weakness (generalized) (M62.81);History of falling (Z91.81)                Time: 8182-9937 OT Time Calculation (min): 12 min Charges:  OT General Charges $OT Visit: 1 Visit OT Evaluation $  OT Eval Low Complexity: [place], OT Acute Rehabilitation Services Pager: [phone] Office: [phone]   Hortencia Pilar 02/24/2019, 11:15 AM

## 2019-02-24 NOTE — Evaluation (Signed)
Physical Therapy Evaluation Patient Details Name: Wesley [name] MRN: [id number] DOB: 01-13-1956 Today's Date: 02/24/2019   History of Present Illness  63 year old with a history of AAA, anxiety, BPH, left renal pelvis carcinoma, COPD with emphysema, CAD, dementia, diabetes mellitus type 2, hyperlipidemia, obstructive sleep apnea, CVA with residual left-sided weakness, depression came to the hospital for evaluation of lethargy and confusion. Recently had a left-sided ureteral stent placed on 02/18/2019.  Admitted for pyelonephritis, sepsis.     Clinical Impression  On eval, pt required Min guard-Min assist for mobility. He walked ~60 feet while pushing an IV pole for support. He is unsteady and fatigues fairly easily. Will follow and progress activity as tolerated. Recommend HHPT f/u if pt is agreeable.     Follow Up Recommendations Home health PT    Equipment Recommendations  None recommended by PT    Recommendations for Other Services       Precautions / Restrictions Precautions Precautions: Fall Restrictions Weight Bearing Restrictions: No      Mobility  Bed Mobility Overal bed mobility: Needs Assistance Bed Mobility: Sit to Supine       Sit to supine: Supervision   General bed mobility comments: oob in recliner  Transfers Overall transfer level: Needs assistance Equipment used: None Transfers: Sit to/from Stand Sit to Stand: Min guard         General transfer comment: close guard for safety. Increased time.   Ambulation/Gait Ambulation/Gait assistance: Min assist Gait Distance (Feet): 60 Feet Assistive device: IV Pole Gait Pattern/deviations: Step-through pattern;Decreased stride length     General Gait Details: No assist given until last 15 feet of ambulation distance. Less steady 2* fatigue.   Stairs            Wheelchair Mobility    Modified Rankin (Stroke Patients Only)       Balance Overall balance assessment: Needs assistance    Sitting balance-Leahy Scale: Good     Standing balance support: Single extremity supported Standing balance-Leahy Scale: Fair                               Pertinent Vitals/Pain Pain Assessment: Faces Faces Pain Scale: Hurts little more Pain Location: L flank, feet with ambulation Pain Descriptors / Indicators: Discomfort Pain Intervention(s): Monitored during session    Home Living Family/patient expects to be discharged to:: Private residence Living Arrangements: Spouse/significant other Available Help at Discharge: Family Type of Home: House Home Access: Stairs to enter Entrance Stairs-Rails: Right Entrance Stairs-Number of Steps: 3 Home Layout: One level Home Equipment: Kasandra Knudsen - quad;Walker - 4 wheels;Shower seat      Prior Function Level of Independence: Needs assistance   Gait / Transfers Assistance Needed: quad cane for short distances, rollator for longer distances (checking the mail)  ADL's / Homemaking Assistance Needed: pt reports he does not need assist  Comments: h/o falls in the shower due to near/syncope     Hand Dominance   Dominant Hand: Right    Extremity/Trunk Assessment   Upper Extremity Assessment Upper Extremity Assessment: Defer to OT evaluation    Lower Extremity Assessment Lower Extremity Assessment: Generalized weakness(Hx of L side residual weakness after CVA)    Cervical / Trunk Assessment Cervical / Trunk Assessment: Normal  Communication   Communication: No difficulties  Cognition Arousal/Alertness: Awake/alert Behavior During Therapy: WFL for tasks assessed/performed Overall Cognitive Status: Within Functional Limits for tasks assessed  General Comments General comments (skin integrity, edema, etc.): Some wooziness on initial stand, pt reports this is baseline for him    Exercises     Assessment/Plan    PT Assessment Patient needs continued PT services   PT Problem List Decreased strength;Decreased balance;Decreased mobility;Decreased activity tolerance;Decreased knowledge of use of DME       PT Treatment Interventions DME instruction;Gait training;Therapeutic activities;Functional mobility training;Balance training;Patient/family education;Therapeutic exercise    PT Goals (Current goals can be found in the Care Plan section)  Acute Rehab PT Goals Patient Stated Goal: feel better, be independent PT Goal Formulation: With patient Time For Goal Achievement: 03/10/19 Potential to Achieve Goals: Good    Frequency Min 3X/week   Barriers to discharge        Co-evaluation               AM-PAC PT "6 Clicks" Mobility  Outcome Measure Help needed turning from your back to your side while in a flat bed without using bedrails?: A Little Help needed moving from lying on your back to sitting on the side of a flat bed without using bedrails?: A Little Help needed moving to and from a bed to a chair (including a wheelchair)?: A Little Help needed standing up from a chair using your arms (e.g., wheelchair or bedside chair)?: A Little Help needed to walk in hospital room?: A Little Help needed climbing 3-5 steps with a railing? : A Lot 6 Click Score: 17    End of Session Equipment Utilized During Treatment: Gait belt Activity Tolerance: Patient limited by fatigue Patient left: in chair;with call bell/phone within reach;with chair alarm set   PT Visit Diagnosis: Unsteadiness on feet (R26.81);Muscle weakness (generalized) (M62.81)    Time: 8372-9021 PT Time Calculation (min) (ACUTE ONLY): 10 min   Charges:   PT Evaluation $PT Eval Moderate Complexity: [place], PT Acute Rehabilitation Services Pager: [phone] Office: [phone]

## 2019-02-24 NOTE — Progress Notes (Signed)
  Echocardiogram 2D Echocardiogram has been performed.  [name] M 02/24/2019, 2:09 PM

## 2019-02-24 NOTE — Progress Notes (Signed)
PROGRESS NOTE    Wesley [name]  IEP:[id number] DOB: 11/30/1956 DOA: 02/21/2019 PCP: [facility]   Brief Narrative:  63 year old with a history of AAA, anxiety, BPH, left renal pelvis carcinoma, COPD with emphysema, CAD, dementia, diabetes mellitus type 2, hyperlipidemia, obstructive sleep apnea, CVA with residual left-sided weakness, depression came to the hospital for evaluation of lethargy and confusion.  He was also found to have fevers and chills.  Recently had a left-sided ureteral stent placed on 02/18/2019.  Urology was consulted.  Admitted for pyelonephritis treatment.  His work-up was significant for Staphylococcus epidermidis bacteremia.  Subjective:  Report he is feeling better today, left abdominal pain has improved, but still present, he is afebrile over last 24 hours, was able to ambulate in the hallway with PT today .  He does report some constipation   Assessment & Plan:   Principal Problem:   Sepsis ([place]) Active Problems:   Cardiac pacemaker in situ   Essential hypertension   COPD mixed type ([place])   Tobacco use disorder   Anxiety and depression   BPH (benign prostatic hyperplasia)   History of substance abuse ([place])   SSS (sick sinus syndrome) ([facility])   History of stroke   Diabetes mellitus type 2 in obese ([facility])   Obstructive sleep apnea   Metabolic encephalopathy   Renal and perinephric abscess   Acute pyelonephritis  Sepsis secondary to acute pyelonephritis, collection, and Staphylococcus  epidermidis bacteremia - sepsis present prior to admission - CT of the abdomen pelvis is consistent with pyelonephritis with questionable surrounding fluid collection, suspicious for abscess . - some  concerns for perinephric abscess hemorrhage.  Culture growing Staphylococcus epidermidis as well blood cultures, I have discussed with urology, there is no indication to repeat imaging as he is nontoxic-appearing and bonding therapy and he is afebrile. -Empirically on  vancomycin and cefepime, stop cefepime 02/23/2019 . -Continue Foley catheter, monitor urine output.  Staphylococcus epidermidis bacteremia -Continue with IV vancomycin, ID input greatly appreciated, cardiology consulted, plan for TEE tomorrow today in the setting of known pacemaker.  TTE was performed today, but reading still pending -Venous blood cultures remain negative -Discussed with urology, will need to continue with his  stent, but plan to remove it by the end of the month   Essential hypertension - remain elevated, but better controlled, continue with Cardizem 180 mg daily, continue with PRN hydralazine   History of COPD, stable -Continue home bronchodilators or its equivalent here.  Currently stable  Diabetes mellitus type 2, insulin-dependent Peripheral neuropathy secondary diabetes mellitus type 2 -Continue Lantus 33 units daily, insulin sliding scale and Accu-Chek. -Continue gabapentin  Tobacco use -Nicotine patch if necessary  History of depression and substance abuse -Resume home medications  History of sick sinus syndrome with pacemaker in place - On Cardizem.  Continue to monitor.  History of seizure -Continue home medications including Keppra  History of CVA with mild left-sided residual defect, nonhemorrhagic -Continue aspirin and statin  BPH -On Flomax and finasteride  Reports some diarrhea, will start on stool softeners  DVT prophylaxis: SCDs,subcu Lovenox  Code Status: Full code Family Communication: Wife at bedside Disposition Plan: Mains on IV antibiotics with need of further work-up  Consultants:   Urology  ID  Procedures:   None  Antimicrobials:   Aztreonam stopped 3/10, Flagyl stopped 3/10  Vancomycin 3/9 >  Cefepime 3/10>3/11   Objective: Vitals:   02/23/19 1941 02/23/19 2100 02/24/19 0535 02/24/19 0828  BP:  (!) 173/87 (!) 177/99  Pulse:  60 60   Resp:  17 20   Temp:  98.5 F (36.9 C) 98.3 F (36.8 C)   TempSrc:   Oral Oral   SpO2: 96% 97% 95% 94%  Weight:      Height:        Intake/Output Summary (Last 24 hours) at 02/24/2019 1355 Last data filed at 02/24/2019 0500 Gross per 24 hour  Intake -  Output 4300 ml  Net -4300 ml   Filed Weights   02/21/19 0615  Weight: 97.5 kg    Examination:  Awake Alert, Oriented X 3, No new F.N deficits, Normal affect Symmetrical Chest wall movement, Good air movement bilaterally, CTAB RRR,No Gallops,Rubs or new Murmurs, No Parasternal Heave +ve B.Sounds, Abd Soft, mild left abdomen tenderness to palpation, No rebound - guarding or rigidity. No Cyanosis, Clubbing or edema, No new Rash or bruise     Foley catheter in place with clear color urine   Data Reviewed:   CBC: Recent Labs  Lab 02/21/19 0626 02/22/19 0554 02/23/19 0558 02/24/19 0555  WBC 11.6* 11.6* 8.6 7.0  NEUTROABS 8.3*  --   --   --   HGB 14.0 11.6* 10.9* 11.1*  HCT 42.8 38.9* 35.5* 36.1*  MCV 86.1 88.8 87.2 86.2  PLT 205 177 169 481   Basic Metabolic Panel: Recent Labs  Lab 02/21/19 0626 02/22/19 0554 02/23/19 0558 02/24/19 0555  NA 137 139 138 140  K 3.7 3.7 3.7 3.6  CL 102 106 107 109  CO2 25 24 25 25   GLUCOSE 286* 166* 200* 154*  BUN 16 18 15 13   CREATININE 1.04 0.80 0.86 0.75  CALCIUM 9.1 8.0* 8.3* 8.6*  MG  --   --  1.9 1.9   GFR: Estimated Creatinine Clearance: 111.3 mL/min (by C-G formula based on SCr of 0.75 mg/dL). Liver Function Tests: Recent Labs  Lab 02/21/19 0626  AST 10*  ALT 25  ALKPHOS 137*  BILITOT 1.0  PROT 7.6  ALBUMIN 4.0   Recent Labs  Lab 02/21/19 0626  LIPASE 27   No results for input(s): AMMONIA in the last 168 hours. Coagulation Profile: Recent Labs  Lab 02/21/19 0626  INR 1.0   Cardiac Enzymes: No results for input(s): CKTOTAL, CKMB, CKMBINDEX, TROPONINI in the last 168 hours. BNP (last 3 results) No results for input(s): PROBNP in the last 8760 hours. HbA1C: No results for input(s): HGBA1C in the last 72 hours. CBG:  Recent Labs  Lab 02/23/19 1211 02/23/19 1616 02/23/19 2112 02/24/19 0743 02/24/19 1127  GLUCAP 213* 164* 236* 118* 163*   Lipid Profile: No results for input(s): CHOL, HDL, LDLCALC, TRIG, CHOLHDL, LDLDIRECT in the last 72 hours. Thyroid Function Tests: No results for input(s): TSH, T4TOTAL, FREET4, T3FREE, THYROIDAB in the last 72 hours. Anemia Panel: No results for input(s): VITAMINB12, FOLATE, FERRITIN, TIBC, IRON, RETICCTPCT in the last 72 hours. Sepsis Labs: Recent Labs  Lab 02/21/19 0626 02/21/19 0800  LATICACIDVEN 1.1 1.0    Recent Results (from the past 240 hour(s))  Culture, blood (Routine x 2)     Status: Abnormal   Collection Time: 02/21/19  6:26 AM  Result Value Ref Range Status   Specimen Description   Final    BLOOD RIGHT HAND Performed at [facility] [address] [place], [place] [address]    Special Requests   Final    BOTTLES DRAWN AEROBIC AND ANAEROBIC Blood Culture results may not be optimal due to an excessive volume of  blood received in culture bottles Performed at [facility], [place] [address] [place], [place] [address]    Culture  Setup Time   Final    GRAM POSITIVE COCCI CRITICAL RESULT CALLED TO, READ BACK BY AND VERIFIED WITH: Elenore Paddy PharmD 16:15 02/22/19 ([email or web address]) AEROBIC BOTTLE ONLY Performed at [facility], [place] [address] [place], [place] [address]    Culture STAPHYLOCOCCUS EPIDERMIDIS (A)  Final   Report Status 02/24/2019 FINAL  Final   Organism ID, Bacteria STAPHYLOCOCCUS EPIDERMIDIS  Final      Susceptibility   Staphylococcus epidermidis - MIC*    CIPROFLOXACIN >=8 RESISTANT Resistant     ERYTHROMYCIN >=8 RESISTANT Resistant     GENTAMICIN >=16 RESISTANT Resistant     OXACILLIN >=4 RESISTANT Resistant     TETRACYCLINE 2 SENSITIVE Sensitive     VANCOMYCIN 1 SENSITIVE Sensitive     TRIMETH/SULFA 160 RESISTANT Resistant     CLINDAMYCIN >=8 RESISTANT Resistant     RIFAMPIN <=0.5  SENSITIVE Sensitive     Inducible Clindamycin NEGATIVE Sensitive     * STAPHYLOCOCCUS EPIDERMIDIS  Culture, blood (Routine x 2)     Status: Abnormal   Collection Time: 02/21/19  6:26 AM  Result Value Ref Range Status   Specimen Description   Final    BLOOD LEFT FOREARM Performed at [facility], [address], [place], [place] [address]    Special Requests   Final    BOTTLES DRAWN AEROBIC AND ANAEROBIC Blood Culture results may not be optimal due to an excessive volume of blood received in culture bottles Performed at [facility] [address], [place], [place] [address]    Culture  Setup Time   Final    GRAM POSITIVE COCCI CRITICAL VALUE NOTED.  VALUE IS CONSISTENT WITH PREVIOUSLY REPORTED AND CALLED VALUE. AEROBIC BOTTLE ONLY    Culture (A)  Final    STAPHYLOCOCCUS EPIDERMIDIS SUSCEPTIBILITIES PERFORMED ON PREVIOUS CULTURE WITHIN THE LAST 5 DAYS. Performed at [facility], [place] [address] [place], [place] [address]    Report Status 02/24/2019 FINAL  Final  Urine culture     Status: Abnormal   Collection Time: 02/21/19  6:26 AM  Result Value Ref Range Status   Specimen Description   Final    URINE, CLEAN CATCH Performed at [facility], [place] [address], [place], [place] [address]    Special Requests   Final    NONE Performed at [facility], [place] [address] [place], [place] [address]    Culture >=100,000 COLONIES/mL STAPHYLOCOCCUS EPIDERMIDIS (A)  Final   Report Status 02/23/2019 FINAL  Final   Organism ID, Bacteria STAPHYLOCOCCUS EPIDERMIDIS (A)  Final      Susceptibility   Staphylococcus epidermidis - MIC*    CIPROFLOXACIN >=8 RESISTANT Resistant     GENTAMICIN >=16 RESISTANT Resistant     NITROFURANTOIN <=16 SENSITIVE Sensitive     OXACILLIN >=4 RESISTANT Resistant     TETRACYCLINE 2 SENSITIVE Sensitive     VANCOMYCIN 1 SENSITIVE Sensitive     TRIMETH/SULFA 80 RESISTANT Resistant      CLINDAMYCIN >=8 RESISTANT Resistant     RIFAMPIN <=0.5 SENSITIVE Sensitive     Inducible Clindamycin NEGATIVE Sensitive     * >=100,000 COLONIES/mL STAPHYLOCOCCUS EPIDERMIDIS  Blood Culture ID Panel (Reflexed)     Status: Abnormal   Collection Time: 02/21/19  6:26 AM  Result Value Ref Range Status   Enterococcus species  NOT DETECTED NOT DETECTED Final   Listeria monocytogenes NOT DETECTED NOT DETECTED Final   Staphylococcus species DETECTED (A) NOT DETECTED Final    Comment: Methicillin (oxacillin) resistant coagulase negative staphylococcus. Possible blood culture contaminant (unless isolated from more than one blood culture draw or clinical case suggests pathogenicity). No antibiotic treatment is indicated for blood  culture contaminants. CRITICAL RESULT CALLED TO, READ BACK BY AND VERIFIED WITH: Elenore Paddy PharmD 16:15 02/22/19 ([email or web address])    Staphylococcus aureus (BCID) NOT DETECTED NOT DETECTED Final   Methicillin resistance DETECTED (A) NOT DETECTED Final    Comment: CRITICAL RESULT CALLED TO, READ BACK BY AND VERIFIED WITH: Elenore Paddy PharmD 16:15 02/22/19 ([email or web address])    Streptococcus species NOT DETECTED NOT DETECTED Final   Streptococcus agalactiae NOT DETECTED NOT DETECTED Final   Streptococcus pneumoniae NOT DETECTED NOT DETECTED Final   Streptococcus pyogenes NOT DETECTED NOT DETECTED Final   Acinetobacter baumannii NOT DETECTED NOT DETECTED Final   Enterobacteriaceae species NOT DETECTED NOT DETECTED Final   Enterobacter cloacae complex NOT DETECTED NOT DETECTED Final   Escherichia coli NOT DETECTED NOT DETECTED Final   Klebsiella oxytoca NOT DETECTED NOT DETECTED Final   Klebsiella pneumoniae NOT DETECTED NOT DETECTED Final   Proteus species NOT DETECTED NOT DETECTED Final   Serratia marcescens NOT DETECTED NOT DETECTED Final   Haemophilus influenzae NOT DETECTED NOT DETECTED Final   Neisseria meningitidis NOT DETECTED NOT DETECTED Final   Pseudomonas aeruginosa NOT  DETECTED NOT DETECTED Final   Candida albicans NOT DETECTED NOT DETECTED Final   Candida glabrata NOT DETECTED NOT DETECTED Final   Candida krusei NOT DETECTED NOT DETECTED Final   Candida parapsilosis NOT DETECTED NOT DETECTED Final   Candida tropicalis NOT DETECTED NOT DETECTED Final    Comment: Performed at [facility], [address], [place], [place] [address]  Culture, blood (Routine X 2) w Reflex to ID Panel     Status: None (Preliminary result)   Collection Time: 02/23/19 11:43 AM  Result Value Ref Range Status   Specimen Description   Final    BLOOD RIGHT ANTECUBITAL Performed at [facility] [address], [place], [place] [address]    Special Requests   Final    BOTTLES DRAWN AEROBIC AND ANAEROBIC Blood Culture adequate volume Performed at [facility] [address] [place], [place] [address]    Culture   Final    NO GROWTH < 24 HOURS Performed at [facility] [address] [place], [place] [address]    Report Status PENDING  Incomplete  Culture, blood (Routine X 2) w Reflex to ID Panel     Status: None (Preliminary result)   Collection Time: 02/23/19 11:45 AM  Result Value Ref Range Status   Specimen Description   Final    BLOOD RIGHT ARM Performed at [facility] [address] [place], [place] [address]    Special Requests   Final    BOTTLES DRAWN AEROBIC AND ANAEROBIC Blood Culture adequate volume Performed at [facility] [address] [place],  [address]    Culture   Final    NO GROWTH < 24 HOURS Performed at [facility] [address], [place],  [address]    Report Status PENDING  Incomplete         Radiology Studies: No results found.      Scheduled Meds: . albuterol  2.5 mg Nebulization BID  .  arformoterol  15 mcg Nebulization BID   And  . umeclidinium bromide  1 puff Inhalation Daily  . aspirin  325 mg Oral Daily  .  atorvastatin  40 mg Oral QHS  . brimonidine  1 drop Both Eyes QHS  . budesonide  0.25 mg Nebulization BID  . cholecalciferol  2,000 Units Oral Daily  . diltiazem  180 mg Oral Daily  . feeding supplement (ENSURE ENLIVE)  237 mL Oral Q24H  . finasteride  5 mg Oral Daily  . gabapentin  300 mg Oral TID  . insulin aspart  0-20 Units Subcutaneous TID WC  . insulin aspart  0-5 Units Subcutaneous QHS  . insulin glargine  33 Units Subcutaneous QHS  . latanoprost  1 drop Both Eyes QHS  . levETIRAcetam  750 mg Oral BID  . loratadine  10 mg Oral Daily  . lurasidone  20 mg Oral QHS  . mirabegron ER  25 mg Oral Daily  . mirtazapine  7.5 mg Oral QHS  . nicotine  14 mg Transdermal Daily  . pantoprazole  40 mg Oral BID AC  . polyethylene glycol  17 g Oral Daily  . polyvinyl alcohol  1 drop Both Eyes QHS  . senna-docusate  3 tablet Oral BID  . tamsulosin  0.4 mg Oral QPC supper  . venlafaxine XR  150 mg Oral Q breakfast   Continuous Infusions: . sodium chloride 50 mL/hr at 02/23/19 1145  . vancomycin 1,750 mg (02/24/19 1221)     LOS: 3 days      Phillips Climes, MD [facility]  If 7PM-7AM, please contact night-coverage [email or web address] 02/24/2019, 1:55 PM

## 2019-02-24 NOTE — Progress Notes (Signed)
Patient scheduled for TEE tomorrow at [facility] at 1245.  Spoke with Abby, bedside RN, to get [facility] transport setup for 1115-1130 arrival.    Vista Lawman, RN

## 2019-02-25 ENCOUNTER — Inpatient Hospital Stay (HOSPITAL_COMMUNITY): Payer: No Typology Code available for payment source

## 2019-02-25 ENCOUNTER — Inpatient Hospital Stay (HOSPITAL_COMMUNITY): Payer: No Typology Code available for payment source | Admitting: Certified Registered"

## 2019-02-25 ENCOUNTER — Encounter (HOSPITAL_COMMUNITY): Payer: Self-pay

## 2019-02-25 ENCOUNTER — Encounter (HOSPITAL_COMMUNITY)
Admission: EM | Disposition: A | Discharge status: Home Health | Payer: Self-pay | Source: Home / Self Care | Attending: Internal Medicine

## 2019-02-25 DIAGNOSIS — I361 Nonrheumatic tricuspid (valve) insufficiency: Secondary | ICD-10-CM

## 2019-02-25 DIAGNOSIS — Z881 Allergy status to other antibiotic agents status: Secondary | ICD-10-CM

## 2019-02-25 DIAGNOSIS — A411 Sepsis due to other specified staphylococcus: Secondary | ICD-10-CM

## 2019-02-25 DIAGNOSIS — Z885 Allergy status to narcotic agent status: Secondary | ICD-10-CM

## 2019-02-25 DIAGNOSIS — Z880 Allergy status to penicillin: Secondary | ICD-10-CM

## 2019-02-25 HISTORY — PX: TEE WITHOUT CARDIOVERSION: SHX5443

## 2019-02-25 LAB — BASIC METABOLIC PANEL
Anion gap: 10 (ref 5–15)
BUN: 13 mg/dL (ref 8–23)
CO2: 24 mmol/L (ref 22–32)
Calcium: 8.8 mg/dL — ABNORMAL LOW (ref 8.9–10.3)
Chloride: 105 mmol/L (ref 98–111)
Creatinine, Ser: 0.77 mg/dL (ref 0.61–1.24)
GFR calc Af Amer: >60 mL/min (ref >60)
GFR calc non Af Amer: >60 mL/min (ref >60)
Glucose, Bld: 191 mg/dL — ABNORMAL HIGH (ref 70–99)
Potassium: 3.6 mmol/L (ref 3.5–5.1)
Sodium: 139 mmol/L (ref 135–145)

## 2019-02-25 LAB — MAGNESIUM: Magnesium: 1.9 mg/dL (ref 1.7–2.4)

## 2019-02-25 LAB — VANCOMYCIN, PEAK: Vancomycin Pk: 36 ug/mL (ref 30–40)

## 2019-02-25 LAB — GLUCOSE, CAPILLARY
Glucose-Capillary: 131 mg/dL — ABNORMAL HIGH (ref 70–99)
Glucose-Capillary: 105 mg/dL — ABNORMAL HIGH (ref 70–99)
Glucose-Capillary: 205 mg/dL — ABNORMAL HIGH (ref 70–99)
Glucose-Capillary: 193 mg/dL — ABNORMAL HIGH (ref 70–99)

## 2019-02-25 LAB — CBC
HCT: 36.9 % — ABNORMAL LOW (ref 39.0–52.0)
Hemoglobin: 11.1 g/dL — ABNORMAL LOW (ref 13.0–17.0)
MCH: 26.1 pg (ref 26.0–34.0)
MCHC: 30.1 g/dL (ref 30.0–36.0)
MCV: 86.6 fL (ref 80.0–100.0)
Platelets: 178 10*3/uL (ref 150–400)
RBC: 4.26 MIL/uL (ref 4.22–5.81)
RDW: 13.7 % (ref 11.5–15.5)
WBC: 6.7 10*3/uL (ref 4.0–10.5)
nRBC: 0 % (ref 0.0–0.2)

## 2019-02-25 LAB — VANCOMYCIN, TROUGH: Vancomycin Tr: 18 ug/mL (ref 15–20)

## 2019-02-25 SURGERY — ECHOCARDIOGRAM, TRANSESOPHAGEAL
Anesthesia: Monitor Anesthesia Care

## 2019-02-25 MED ORDER — VANCOMYCIN HCL 10 G IV SOLR
1500.0000 mg | Freq: Two times a day (BID) | INTRAVENOUS | Status: DC
  Administered 2019-02-25 – 2019-02-28 (×8): 1500 mg via INTRAVENOUS
  Filled 2019-02-25 (×8): qty 1500

## 2019-02-25 MED ORDER — HYDRALAZINE HCL 20 MG/ML IJ SOLN
10.0000 mg | INTRAMUSCULAR | Status: DC | PRN
  Administered 2019-02-25: 10 mg via INTRAVENOUS
  Filled 2019-02-25: qty 1

## 2019-02-25 MED ORDER — FENTANYL CITRATE (PF) 100 MCG/2ML IJ SOLN
INTRAMUSCULAR | Status: DC | PRN
  Administered 2019-02-25: 25 ug via INTRAVENOUS

## 2019-02-25 MED ORDER — HYDRALAZINE HCL 25 MG PO TABS
25.0000 mg | ORAL_TABLET | Freq: Three times a day (TID) | ORAL | Status: DC
  Administered 2019-02-25 – 2019-02-28 (×11): 25 mg via ORAL
  Filled 2019-02-25 (×11): qty 1

## 2019-02-25 MED ORDER — LACTATED RINGERS IV SOLN
INTRAVENOUS | Status: DC
  Administered 2019-02-25 (×2): via INTRAVENOUS

## 2019-02-25 MED ORDER — HYDROCHLOROTHIAZIDE 25 MG PO TABS
25.0000 mg | ORAL_TABLET | Freq: Every day | ORAL | Status: DC
  Administered 2019-02-25 – 2019-02-28 (×4): 25 mg via ORAL
  Filled 2019-02-25 (×4): qty 1

## 2019-02-25 MED ORDER — PROPOFOL 500 MG/50ML IV EMUL
INTRAVENOUS | Status: DC | PRN
  Administered 2019-02-25: 100 ug/kg/min via INTRAVENOUS

## 2019-02-25 MED ORDER — VANCOMYCIN HCL 10 G IV SOLR
1500.0000 mg | Freq: Two times a day (BID) | INTRAVENOUS | Status: DC
  Filled 2019-02-25: qty 1500

## 2019-02-25 MED ORDER — POLYETHYLENE GLYCOL 3350 17 G PO PACK
34.0000 g | PACK | Freq: Once | ORAL | Status: AC
  Administered 2019-02-25: 34 g via ORAL
  Filled 2019-02-25: qty 2

## 2019-02-25 MED ORDER — PROPOFOL 10 MG/ML IV BOLUS
INTRAVENOUS | Status: DC | PRN
  Administered 2019-02-25 (×2): 20 mg via INTRAVENOUS

## 2019-02-25 NOTE — Progress Notes (Signed)
PT Cancellation Note  Patient Details Name: Wesley [name] MRN: [id number] DOB: Jan 29, 1956   Cancelled Treatment:    Reason Eval/Treat Not Completed: Other (comment) Pt politely declined.  Pt just back from [facility] for test and received lunch tray.   [name],[name] 02/25/2019, 3:27 PM [name], PT, DPT Acute Rehabilitation Services Office: [phone] Pager: [phone]

## 2019-02-25 NOTE — Anesthesia Postprocedure Evaluation (Signed)
Anesthesia Post Note  Patient: Wesley [name]  Procedure(s) Performed: TRANSESOPHAGEAL ECHOCARDIOGRAM (TEE) (N/A )     Patient location during evaluation: Endoscopy Anesthesia Type: MAC Level of consciousness: awake and alert Pain management: pain level controlled Vital Signs Assessment: post-procedure vital signs reviewed and stable Respiratory status: spontaneous breathing, nonlabored ventilation, respiratory function stable and patient connected to nasal cannula oxygen Cardiovascular status: blood pressure returned to baseline and stable Postop Assessment: no apparent nausea or vomiting Anesthetic complications: no    Last Vitals:  Vitals:   02/25/19 1130 02/25/19 1245  BP: (!) 187/105 124/67  Pulse: 68   Resp: 14 18  Temp: 36.8 C   SpO2: 98% 98%    Last Pain:  Vitals:   02/25/19 1245  TempSrc:   PainSc: 0-No pain                 Barnet Glasgow

## 2019-02-25 NOTE — Progress Notes (Signed)
  Echocardiogram Echocardiogram Transesophageal has been performed.  Wesley [name] 02/25/2019, 12:47 PM

## 2019-02-25 NOTE — Progress Notes (Signed)
PROGRESS NOTE    Wesley [name]  WCH:[id number] DOB: Nov 04, 1956 DOA: 02/21/2019 PCP: [facility]   Brief Narrative:  63 year old with a history of AAA, anxiety, BPH, left renal pelvis carcinoma, COPD with emphysema, CAD, dementia, diabetes mellitus type 2, hyperlipidemia, obstructive sleep apnea, CVA with residual left-sided weakness, depression came to the hospital for evaluation of lethargy and confusion.  He was also found to have fevers and chills.  Recently had a left-sided ureteral stent placed on 02/18/2019.  Urology was consulted.  Admitted for pyelonephritis treatment.  His work-up was significant for Staphylococcus epidermidis bacteremia.  Subjective:  Report he is feeling better today, reports only small bowel movements, want something more for constipation, he is n.p.o. for TEE, reports abdominal pain significantly improved .  Assessment & Plan:   Principal Problem:   Sepsis ([place]) Active Problems:   Cardiac pacemaker in situ   Essential hypertension   COPD mixed type ([place])   Tobacco use disorder   Anxiety and depression   BPH (benign prostatic hyperplasia)   History of substance abuse ([place])   SSS (sick sinus syndrome) ([facility])   History of stroke   Diabetes mellitus type 2 in obese ([facility])   Obstructive sleep apnea   Metabolic encephalopathy   Renal and perinephric abscess   Acute pyelonephritis   Bacteremia  Sepsis secondary to acute pyelonephritis, collection, and Staphylococcus  epidermidis bacteremia - sepsis present prior to admission - CT of the abdomen pelvis is consistent with pyelonephritis with questionable surrounding fluid collection, suspicious for abscess . - some  concerns for perinephric abscess hemorrhage.  Culture growing Staphylococcus epidermidis as well blood cultures, I have discussed with urology, there is no indication to repeat imaging as he is nontoxic-appearing and responding well to IV antibiotic regimen, and he is afebrile .  -Empirically on vancomycin and cefepime, stop cefepime 02/23/2019 . -Continue Foley catheter, monitor urine output.  Staphylococcus epidermidis bacteremia -Continue with IV vancomycin, ID input greatly appreciated, TEE performed today, with no evidence of endocarditis. -We will discuss with ID about length of treatment, we will need a PICC line.   Essential hypertension - remain elevated,continue with Cardizem 180 mg daily, will start on hydrochlorothiazide continue with PRN hydralazine   History of COPD, stable -Continue home bronchodilators or its equivalent here.  Currently stable  Diabetes mellitus type 2, insulin-dependent Peripheral neuropathy secondary diabetes mellitus type 2 -Continue Lantus 33 units daily, insulin sliding scale and Accu-Chek. -Continue gabapentin  Tobacco use -Nicotine patch if necessary  History of depression and substance abuse -Resume home medications  History of sick sinus syndrome with pacemaker in place - On Cardizem.  Continue to monitor.  History of seizure -Continue home medications including Keppra  History of nonhemorrhagic CVA with mild left-sided residual defect, nonhemorrhagic -Continue aspirin and statin  BPH -On Flomax and finasteride  Reports some constipation, will start on stool softeners  DVT prophylaxis: SCDs,subcu Lovenox  Code Status: Full code Family Communication: None at bedside Disposition Plan: Mains on IV antibiotics with need of further work-up  Consultants:   Urology  ID  Procedures:   None  Antimicrobials:   Aztreonam stopped 3/10, Flagyl stopped 3/10  Vancomycin 3/9 >  Cefepime 3/10>3/11   Objective: Vitals:   02/25/19 1245 02/25/19 1300 02/25/19 1315 02/25/19 1443  BP: 124/67 (!) 159/69 (!) 146/72 (!) 170/90  Pulse:  61 60   Resp: 18 13 16    Temp:      TempSrc:      SpO2: 98%  97% 97%   Weight:      Height:        Intake/Output Summary (Last 24 hours) at 02/25/2019 1457 Last data  filed at 02/25/2019 1248 Gross per 24 hour  Intake 3222.69 ml  Output 2625 ml  Net 597.69 ml   Filed Weights   02/21/19 0615  Weight: 97.5 kg    Examination:  Awake Alert, Oriented X 3, No new F.N deficits, Normal affect Symmetrical Chest wall movement, Good air movement bilaterally, CTAB RRR,No Gallops,Rubs or new Murmurs, No Parasternal Heave +ve B.Sounds, Abd Soft, abdominal tenderness significantly improved, No rebound - guarding or rigidity. No Cyanosis, Clubbing or edema, No new Rash or bruise       Data Reviewed:   CBC: Recent Labs  Lab 02/21/19 0626 02/22/19 0554 02/23/19 0558 02/24/19 0555 02/25/19 0145  WBC 11.6* 11.6* 8.6 7.0 6.7  NEUTROABS 8.3*  --   --   --   --   HGB 14.0 11.6* 10.9* 11.1* 11.1*  HCT 42.8 38.9* 35.5* 36.1* 36.9*  MCV 86.1 88.8 87.2 86.2 86.6  PLT 205 177 169 193 798   Basic Metabolic Panel: Recent Labs  Lab 02/21/19 0626 02/22/19 0554 02/23/19 0558 02/24/19 0555 02/25/19 0145  NA 137 139 138 140 139  K 3.7 3.7 3.7 3.6 3.6  CL 102 106 107 109 105  CO2 25 24 25 25 24   GLUCOSE 286* 166* 200* 154* 191*  BUN 16 18 15 13 13   CREATININE 1.04 0.80 0.86 0.75 0.77  CALCIUM 9.1 8.0* 8.3* 8.6* 8.8*  MG  --   --  1.9 1.9 1.9   GFR: Estimated Creatinine Clearance: 111.3 mL/min (by C-G formula based on SCr of 0.77 mg/dL). Liver Function Tests: Recent Labs  Lab 02/21/19 0626  AST 10*  ALT 25  ALKPHOS 137*  BILITOT 1.0  PROT 7.6  ALBUMIN 4.0   Recent Labs  Lab 02/21/19 0626  LIPASE 27   No results for input(s): AMMONIA in the last 168 hours. Coagulation Profile: Recent Labs  Lab 02/21/19 0626  INR 1.0   Cardiac Enzymes: No results for input(s): CKTOTAL, CKMB, CKMBINDEX, TROPONINI in the last 168 hours. BNP (last 3 results) No results for input(s): PROBNP in the last 8760 hours. HbA1C: No results for input(s): HGBA1C in the last 72 hours. CBG: Recent Labs  Lab 02/24/19 1127 02/24/19 1637 02/24/19 2022 02/25/19  0740 02/25/19 1425  GLUCAP 163* 268* 204* 131* 105*   Lipid Profile: No results for input(s): CHOL, HDL, LDLCALC, TRIG, CHOLHDL, LDLDIRECT in the last 72 hours. Thyroid Function Tests: No results for input(s): TSH, T4TOTAL, FREET4, T3FREE, THYROIDAB in the last 72 hours. Anemia Panel: No results for input(s): VITAMINB12, FOLATE, FERRITIN, TIBC, IRON, RETICCTPCT in the last 72 hours. Sepsis Labs: Recent Labs  Lab 02/21/19 0626 02/21/19 0800  LATICACIDVEN 1.1 1.0    Recent Results (from the past 240 hour(s))  Culture, blood (Routine x 2)     Status: Abnormal   Collection Time: 02/21/19  6:26 AM  Result Value Ref Range Status   Specimen Description   Final    BLOOD RIGHT HAND Performed at [facility] [address], [place], [place] [address]    Special Requests   Final    BOTTLES DRAWN AEROBIC AND ANAEROBIC Blood Culture results may not be optimal due to an excessive volume of blood received in culture bottles Performed at [facility] [address] [place], [place] [address]  Culture  Setup Time   Final    GRAM POSITIVE COCCI CRITICAL RESULT CALLED TO, READ BACK BY AND VERIFIED WITH: Elenore Paddy PharmD 16:15 02/22/19 ([email or web address]) AEROBIC BOTTLE ONLY Performed at [facility], [place] [address] [place], [place] [address]    Culture STAPHYLOCOCCUS EPIDERMIDIS (A)  Final   Report Status 02/24/2019 FINAL  Final   Organism ID, Bacteria STAPHYLOCOCCUS EPIDERMIDIS  Final      Susceptibility   Staphylococcus epidermidis - MIC*    CIPROFLOXACIN >=8 RESISTANT Resistant     ERYTHROMYCIN >=8 RESISTANT Resistant     GENTAMICIN >=16 RESISTANT Resistant     OXACILLIN >=4 RESISTANT Resistant     TETRACYCLINE 2 SENSITIVE Sensitive     VANCOMYCIN 1 SENSITIVE Sensitive     TRIMETH/SULFA 160 RESISTANT Resistant     CLINDAMYCIN >=8 RESISTANT Resistant     RIFAMPIN <=0.5 SENSITIVE Sensitive     Inducible Clindamycin NEGATIVE Sensitive     *  STAPHYLOCOCCUS EPIDERMIDIS  Culture, blood (Routine x 2)     Status: Abnormal   Collection Time: 02/21/19  6:26 AM  Result Value Ref Range Status   Specimen Description   Final    BLOOD LEFT FOREARM Performed at [facility], [address], [place], [place] [address]    Special Requests   Final    BOTTLES DRAWN AEROBIC AND ANAEROBIC Blood Culture results may not be optimal due to an excessive volume of blood received in culture bottles Performed at [facility] [address] [place], [place] [address]    Culture  Setup Time   Final    GRAM POSITIVE COCCI CRITICAL VALUE NOTED.  VALUE IS CONSISTENT WITH PREVIOUSLY REPORTED AND CALLED VALUE. AEROBIC BOTTLE ONLY    Culture (A)  Final    STAPHYLOCOCCUS EPIDERMIDIS SUSCEPTIBILITIES PERFORMED ON PREVIOUS CULTURE WITHIN THE LAST 5 DAYS. Performed at [facility], [place] [address], [place], [place] [address]    Report Status 02/24/2019 FINAL  Final  Urine culture     Status: Abnormal   Collection Time: 02/21/19  6:26 AM  Result Value Ref Range Status   Specimen Description   Final    URINE, CLEAN CATCH Performed at [facility], [place] [address] [place], [place] [address]    Special Requests   Final    NONE Performed at [facility], [place] [address], [place], [place] [address]    Culture >=100,000 COLONIES/mL STAPHYLOCOCCUS EPIDERMIDIS (A)  Final   Report Status 02/23/2019 FINAL  Final   Organism ID, Bacteria STAPHYLOCOCCUS EPIDERMIDIS (A)  Final      Susceptibility   Staphylococcus epidermidis - MIC*    CIPROFLOXACIN >=8 RESISTANT Resistant     GENTAMICIN >=16 RESISTANT Resistant     NITROFURANTOIN <=16 SENSITIVE Sensitive     OXACILLIN >=4 RESISTANT Resistant     TETRACYCLINE 2 SENSITIVE Sensitive     VANCOMYCIN 1 SENSITIVE Sensitive     TRIMETH/SULFA 80 RESISTANT Resistant     CLINDAMYCIN >=8 RESISTANT Resistant     RIFAMPIN <=0.5 SENSITIVE Sensitive      Inducible Clindamycin NEGATIVE Sensitive     * >=100,000 COLONIES/mL STAPHYLOCOCCUS EPIDERMIDIS  Blood Culture ID Panel (Reflexed)     Status: Abnormal   Collection Time: 02/21/19  6:26 AM  Result Value Ref Range Status   Enterococcus species NOT DETECTED NOT DETECTED Final   Listeria monocytogenes NOT DETECTED NOT DETECTED Final   Staphylococcus species DETECTED (A) NOT  DETECTED Final    Comment: Methicillin (oxacillin) resistant coagulase negative staphylococcus. Possible blood culture contaminant (unless isolated from more than one blood culture draw or clinical case suggests pathogenicity). No antibiotic treatment is indicated for blood  culture contaminants. CRITICAL RESULT CALLED TO, READ BACK BY AND VERIFIED WITH: Elenore Paddy PharmD 16:15 02/22/19 ([email or web address])    Staphylococcus aureus (BCID) NOT DETECTED NOT DETECTED Final   Methicillin resistance DETECTED (A) NOT DETECTED Final    Comment: CRITICAL RESULT CALLED TO, READ BACK BY AND VERIFIED WITH: Elenore Paddy PharmD 16:15 02/22/19 ([email or web address])    Streptococcus species NOT DETECTED NOT DETECTED Final   Streptococcus agalactiae NOT DETECTED NOT DETECTED Final   Streptococcus pneumoniae NOT DETECTED NOT DETECTED Final   Streptococcus pyogenes NOT DETECTED NOT DETECTED Final   Acinetobacter baumannii NOT DETECTED NOT DETECTED Final   Enterobacteriaceae species NOT DETECTED NOT DETECTED Final   Enterobacter cloacae complex NOT DETECTED NOT DETECTED Final   Escherichia coli NOT DETECTED NOT DETECTED Final   Klebsiella oxytoca NOT DETECTED NOT DETECTED Final   Klebsiella pneumoniae NOT DETECTED NOT DETECTED Final   Proteus species NOT DETECTED NOT DETECTED Final   Serratia marcescens NOT DETECTED NOT DETECTED Final   Haemophilus influenzae NOT DETECTED NOT DETECTED Final   Neisseria meningitidis NOT DETECTED NOT DETECTED Final   Pseudomonas aeruginosa NOT DETECTED NOT DETECTED Final   Candida albicans NOT DETECTED NOT DETECTED Final    Candida glabrata NOT DETECTED NOT DETECTED Final   Candida krusei NOT DETECTED NOT DETECTED Final   Candida parapsilosis NOT DETECTED NOT DETECTED Final   Candida tropicalis NOT DETECTED NOT DETECTED Final    Comment: Performed at [facility], [address], [place], [place] [address]  Culture, blood (Routine X 2) w Reflex to ID Panel     Status: None (Preliminary result)   Collection Time: 02/23/19 11:43 AM  Result Value Ref Range Status   Specimen Description   Final    BLOOD RIGHT ANTECUBITAL Performed at [facility] [address], [place], [place] [address]    Special Requests   Final    BOTTLES DRAWN AEROBIC AND ANAEROBIC Blood Culture adequate volume Performed at [facility] [address], [place], [place] [address]    Culture   Final    NO GROWTH 1 DAY Performed at [facility], [place] [address], [place], [place] [address]    Report Status PENDING  Incomplete  Culture, blood (Routine X 2) w Reflex to ID Panel     Status: None (Preliminary result)   Collection Time: 02/23/19 11:45 AM  Result Value Ref Range Status   Specimen Description   Final    BLOOD RIGHT ARM Performed at [facility] [address], [place], [place] [address]    Special Requests   Final    BOTTLES DRAWN AEROBIC AND ANAEROBIC Blood Culture adequate volume Performed at [facility] [address] [place], [place] [address]    Culture   Final    NO GROWTH 1 DAY Performed at [facility], [place] [address] [place], [place] [address]    Report Status PENDING  Incomplete         Radiology Studies: No results found.      Scheduled Meds: . albuterol  2.5 mg Nebulization BID  . arformoterol  15 mcg Nebulization BID   And  . umeclidinium bromide  1 puff Inhalation Daily  . aspirin  325  mg Oral Daily  . atorvastatin  40 mg Oral QHS  . brimonidine  1 drop Both Eyes QHS  . budesonide  0.25 mg  Nebulization BID  . cholecalciferol  2,000 Units Oral Daily  . diltiazem  180 mg Oral Daily  . enoxaparin (LOVENOX) injection  40 mg Subcutaneous Q24H  . feeding supplement (ENSURE ENLIVE)  237 mL Oral Q24H  . finasteride  5 mg Oral Daily  . gabapentin  300 mg Oral TID  . hydrochlorothiazide  25 mg Oral Daily  . insulin aspart  0-20 Units Subcutaneous TID WC  . insulin aspart  0-5 Units Subcutaneous QHS  . insulin glargine  33 Units Subcutaneous QHS  . latanoprost  1 drop Both Eyes QHS  . levETIRAcetam  750 mg Oral BID  . loratadine  10 mg Oral Daily  . lurasidone  20 mg Oral QHS  . mirabegron ER  25 mg Oral Daily  . mirtazapine  7.5 mg Oral QHS  . nicotine  14 mg Transdermal Daily  . pantoprazole  40 mg Oral BID AC  . polyethylene glycol  17 g Oral Daily  . polyvinyl alcohol  1 drop Both Eyes QHS  . senna-docusate  3 tablet Oral BID  . tamsulosin  0.4 mg Oral QPC supper  . venlafaxine XR  150 mg Oral Q breakfast   Continuous Infusions: . sodium chloride 50 mL/hr at 02/25/19 0600  . vancomycin       LOS: 4 days      Phillips Climes, MD [facility]  If 7PM-7AM, please contact night-coverage [email or web address] 02/25/2019, 2:57 PM

## 2019-02-25 NOTE — Progress Notes (Signed)
PHARMACY CONSULT NOTE FOR:  OUTPATIENT  PARENTERAL ANTIBIOTIC THERAPY (OPAT)  Indication: MRSE bacteremia Regimen: Vancomycin 1500mg  IV q12h End date: 03/07/19  IV antibiotic discharge orders are pended. To discharging provider:  please sign these orders via discharge navigator,  Select New Orders & click on the button choice - Manage This Unsigned Work.     Thank you for allowing pharmacy to be a part of this patient's care.  Peggyann Juba, PharmD, BCPS Pager: [phone] 02/25/2019, 8:57 PM

## 2019-02-25 NOTE — Progress Notes (Signed)
Endoscopy recovery complete following TEE. Patient remains in endoscopy at this time awaiting care link transfer back to [facility].

## 2019-02-25 NOTE — Progress Notes (Signed)
Pharmacy Antibiotic Note  Wesley [name] is a 63 y.o. male admitted on 02/21/2019 with pyelonephritis vs post L ureteral stent placement infection.  Pharmacy has been consulted for Vancomycin dosing.  Today 02/25/19:  No fevers since 3/10  WBC WNL  SCr stable at/below baseline  Day 4 of Vancomycin  Vanc trough (3/13 @0901 ): 18  Vanc peak (3/13 @0145 ): 36  Calculated AUC: 638.6 - note d/t schedule change, interval between doses was shorted by 2 hours prior to drawing the levels   Goals of Tx:  AUC 400-550  Plan:  Reduce vancomycin to 1500 mg IV Q12H  Calculated AUC 546.7  Estimated trough 13.1  AUC is on the higher end but based on timing issues  noted above, probably an overestimate  Monitor SCr Q48H while on vanc   Height: 6\' 2"  (188 cm) Weight: 215 lb (97.5 kg) IBW/kg (Calculated) : 82.2  Temp (24hrs), Avg:98.5 F (36.9 C), Min:98.4 F (36.9 C), Max:98.6 F (37 C)  Recent Labs  Lab 02/21/19 0626 02/21/19 0800 02/22/19 0554 02/23/19 0558 02/24/19 0555 02/25/19 0145 02/25/19 0901  WBC 11.6*  --  11.6* 8.6 7.0 6.7  --   CREATININE 1.04  --  0.80 0.86 0.75 0.77  --   LATICACIDVEN 1.1 1.0  --   --   --   --   --   VANCOTROUGH  --   --   --   --   --   --  18  VANCOPEAK  --   --   --   --   --  36  --     Estimated Creatinine Clearance: 111.3 mL/min (by C-G formula based on SCr of 0.77 mg/dL).    Allergies  Allergen Reactions  . Penicillins Swelling    "General swelling" Tolerated cefepime & ceftriaxone previously. Has patient had a PCN reaction causing immediate rash, facial/tongue/throat swelling, SOB or lightheadedness with hypotension: Yes Has patient had a PCN reaction causing severe rash involving mucus membranes or skin necrosis: Unk Has patient had a PCN reaction that required hospitalization: Yes Has patient had a PCN reaction occurring within the last 10 years: No If all of the above answers are "NO", then may proceed with Cephalosporin  .  Percocet [Oxycodone-Acetaminophen]     Itching  . Levaquin [Levofloxacin In D5w] Hives    Antimicrobials this admission: Vanc 3/10 >> Cefepime 3/9 >> 3/11 Vanc/Aztreonam/Flagyl x 1 on 3/9   Microbiology results: 3/11 BCx: NG x 1 day 3/9 BCx: 2/2 (2/4 bottles) MRSE 3/9 UCx: >100K MRSE  Thank you for allowing pharmacy to be a part of this patient's care.  Knox Saliva, PharmD Candidate 02/25/2019 10:41 AM

## 2019-02-25 NOTE — Progress Notes (Deleted)
PHARMACY CONSULT NOTE FOR:  OUTPATIENT  PARENTERAL ANTIBIOTIC THERAPY (OPAT)  Indication: MRSE bacteremia  Regimen: Vancomycin 1750mg  IV q12h End date: 03/07/19  IV antibiotic discharge orders are pended. To discharging provider:  please sign these orders via discharge navigator,  Select New Orders & click on the button choice - Manage This Unsigned Work.     Thank you for allowing pharmacy to be a part of this patient's care.  Peggyann Juba, PharmD, BCPS Pager: [phone] 02/25/2019, 4:09 PM

## 2019-02-25 NOTE — Progress Notes (Addendum)
INFECTIOUS DISEASE PROGRESS NOTE  ID: Wesley [name] is a 63 y.o. male with  Principal Problem:   Sepsis ([place]) Active Problems:   Cardiac pacemaker in situ   Essential hypertension   COPD mixed type ([place])   Tobacco use disorder   Anxiety and depression   BPH (benign prostatic hyperplasia)   History of substance abuse ([place])   SSS (sick sinus syndrome) ([place])   History of stroke   Diabetes mellitus type 2 in obese ([place])   Obstructive sleep apnea   Metabolic encephalopathy   Renal and perinephric abscess   Acute pyelonephritis   Bacteremia  Subjective: No complaints.   Abtx:  Anti-infectives (From admission, onward)   Start     Dose/Rate Route Frequency Ordered Stop   02/23/19 1600  rifampin (RIFADIN) capsule 300 mg  Status:  Discontinued     300 mg Oral Every 12 hours 02/23/19 1238 02/23/19 1250   02/22/19 1200  [MAR Hold]  vancomycin (VANCOCIN) 1,750 mg in sodium chloride 0.9 % 500 mL IVPB     (MAR Hold since Fri 02/25/2019 at 1128. Reason: Transfer to a Procedural area.)   1,750 mg 250 mL/hr over 120 Minutes Intravenous Every 12 hours 02/22/19 1054     02/21/19 1600  ceFEPIme (MAXIPIME) 2 g in sodium chloride 0.9 % 100 mL IVPB  Status:  Discontinued     2 g 200 mL/hr over 30 Minutes Intravenous Every 8 hours 02/21/19 1223 02/23/19 1038   02/21/19 1400  aztreonam (AZACTAM) injection 2 g  Status:  Discontinued     2 g Intramuscular Every 8 hours 02/21/19 1036 02/21/19 1222   02/21/19 0715  vancomycin (VANCOCIN) 2,000 mg in sodium chloride 0.9 % 500 mL IVPB     2,000 mg 250 mL/hr over 120 Minutes Intravenous  Once 02/21/19 0659 02/22/19 1058   02/21/19 0715  aztreonam (AZACTAM) 2 g in sodium chloride 0.9 % 100 mL IVPB     2 g 200 mL/hr over 30 Minutes Intravenous STAT 02/21/19 0700 02/21/19 0947   02/21/19 0700  metroNIDAZOLE (FLAGYL) IVPB 500 mg  Status:  Discontinued     500 mg 100 mL/hr over 60 Minutes Intravenous Every 8 hours 02/21/19 0654 02/22/19 1004       Medications:  Scheduled: . albuterol  2.5 mg Nebulization BID  . arformoterol  15 mcg Nebulization BID   And  . umeclidinium bromide  1 puff Inhalation Daily  . aspirin  325 mg Oral Daily  . atorvastatin  40 mg Oral QHS  . brimonidine  1 drop Both Eyes QHS  . budesonide  0.25 mg Nebulization BID  . cholecalciferol  2,000 Units Oral Daily  . diltiazem  180 mg Oral Daily  . enoxaparin (LOVENOX) injection  40 mg Subcutaneous Q24H  . feeding supplement (ENSURE ENLIVE)  237 mL Oral Q24H  . finasteride  5 mg Oral Daily  . gabapentin  300 mg Oral TID  . hydrALAZINE  25 mg Oral Q8H  . hydrochlorothiazide  25 mg Oral Daily  . insulin aspart  0-20 Units Subcutaneous TID WC  . insulin aspart  0-5 Units Subcutaneous QHS  . insulin glargine  33 Units Subcutaneous QHS  . latanoprost  1 drop Both Eyes QHS  . levETIRAcetam  750 mg Oral BID  . loratadine  10 mg Oral Daily  . lurasidone  20 mg Oral QHS  . mirabegron ER  25 mg Oral Daily  . mirtazapine  7.5 mg Oral QHS  .  nicotine  14 mg Transdermal Daily  . pantoprazole  40 mg Oral BID AC  . polyethylene glycol  17 g Oral Daily  . polyethylene glycol  34 g Oral Once  . polyvinyl alcohol  1 drop Both Eyes QHS  . senna-docusate  3 tablet Oral BID  . tamsulosin  0.4 mg Oral QPC supper  . venlafaxine XR  150 mg Oral Q breakfast    Objective: Vital signs in last 24 hours: Temp:  [98.2 F (36.8 C)-98.6 F (37 C)] 98.2 F (36.8 C) (03/13 1130) Pulse Rate:  [59-68] 68 (03/13 1130) Resp:  [14-19] 14 (03/13 1130) BP: (164-187)/(81-114) 187/105 (03/13 1130) SpO2:  [90 %-98 %] 98 % (03/13 1130)   General appearance: alert, cooperative and no distress Resp: clear to auscultation bilaterally Cardio: regular rate and rhythm GI: normal findings: bowel sounds normal and soft, non-tender  Lab Results Recent Labs    02/24/19 0555 02/25/19 0145  WBC 7.0 6.7  HGB 11.1* 11.1*  HCT 36.1* 36.9*  NA 140 139  K 3.6 3.6  CL 109 105  CO2 25 24   BUN 13 13  CREATININE 0.75 0.77   Liver Panel No results for input(s): PROT, ALBUMIN, AST, ALT, ALKPHOS, BILITOT, BILIDIR, IBILI in the last 72 hours. Sedimentation Rate No results for input(s): ESRSEDRATE in the last 72 hours. C-Reactive Protein No results for input(s): CRP in the last 72 hours.  Microbiology: Recent Results (from the past 240 hour(s))  Culture, blood (Routine x 2)     Status: Abnormal   Collection Time: 02/21/19  6:26 AM  Result Value Ref Range Status   Specimen Description   Final    BLOOD RIGHT HAND Performed at [facility] [address], [place], [place] [address]    Special Requests   Final    BOTTLES DRAWN AEROBIC AND ANAEROBIC Blood Culture results may not be optimal due to an excessive volume of blood received in culture bottles Performed at [facility] [address], [place], [place] [address]    Culture  Setup Time   Final    GRAM POSITIVE COCCI CRITICAL RESULT CALLED TO, READ BACK BY AND VERIFIED WITH: Elenore Paddy PharmD 16:15 02/22/19 ([email or web address]) AEROBIC BOTTLE ONLY Performed at [facility], [place] [address], [place], [place] [address]    Culture STAPHYLOCOCCUS EPIDERMIDIS (A)  Final   Report Status 02/24/2019 FINAL  Final   Organism ID, Bacteria STAPHYLOCOCCUS EPIDERMIDIS  Final      Susceptibility   Staphylococcus epidermidis - MIC*    CIPROFLOXACIN >=8 RESISTANT Resistant     ERYTHROMYCIN >=8 RESISTANT Resistant     GENTAMICIN >=16 RESISTANT Resistant     OXACILLIN >=4 RESISTANT Resistant     TETRACYCLINE 2 SENSITIVE Sensitive     VANCOMYCIN 1 SENSITIVE Sensitive     TRIMETH/SULFA 160 RESISTANT Resistant     CLINDAMYCIN >=8 RESISTANT Resistant     RIFAMPIN <=0.5 SENSITIVE Sensitive     Inducible Clindamycin NEGATIVE Sensitive     * STAPHYLOCOCCUS EPIDERMIDIS  Culture, blood (Routine x 2)     Status: Abnormal   Collection Time: 02/21/19  6:26 AM  Result Value Ref Range Status   Specimen  Description   Final    BLOOD LEFT FOREARM Performed at [facility], [address], [place], [place] [address]    Special Requests   Final    BOTTLES DRAWN AEROBIC AND ANAEROBIC Blood Culture results may not be optimal  due to an excessive volume of blood received in culture bottles Performed at [facility] [address], [place], [place] [address]    Culture  Setup Time   Final    GRAM POSITIVE COCCI CRITICAL VALUE NOTED.  VALUE IS CONSISTENT WITH PREVIOUSLY REPORTED AND CALLED VALUE. AEROBIC BOTTLE ONLY    Culture (A)  Final    STAPHYLOCOCCUS EPIDERMIDIS SUSCEPTIBILITIES PERFORMED ON PREVIOUS CULTURE WITHIN THE LAST 5 DAYS. Performed at [facility], [place] [address] [place], [place] [address]    Report Status 02/24/2019 FINAL  Final  Urine culture     Status: Abnormal   Collection Time: 02/21/19  6:26 AM  Result Value Ref Range Status   Specimen Description   Final    URINE, CLEAN CATCH Performed at [facility], [place] [address] [place], [place] [address]    Special Requests   Final    NONE Performed at [facility], [place] [address] [place], [place] [address]    Culture >=100,000 COLONIES/mL STAPHYLOCOCCUS EPIDERMIDIS (A)  Final   Report Status 02/23/2019 FINAL  Final   Organism ID, Bacteria STAPHYLOCOCCUS EPIDERMIDIS (A)  Final      Susceptibility   Staphylococcus epidermidis - MIC*    CIPROFLOXACIN >=8 RESISTANT Resistant     GENTAMICIN >=16 RESISTANT Resistant     NITROFURANTOIN <=16 SENSITIVE Sensitive     OXACILLIN >=4 RESISTANT Resistant     TETRACYCLINE 2 SENSITIVE Sensitive     VANCOMYCIN 1 SENSITIVE Sensitive     TRIMETH/SULFA 80 RESISTANT Resistant     CLINDAMYCIN >=8 RESISTANT Resistant     RIFAMPIN <=0.5 SENSITIVE Sensitive     Inducible Clindamycin NEGATIVE Sensitive     * >=100,000 COLONIES/mL STAPHYLOCOCCUS EPIDERMIDIS  Blood Culture ID Panel (Reflexed)     Status: Abnormal    Collection Time: 02/21/19  6:26 AM  Result Value Ref Range Status   Enterococcus species NOT DETECTED NOT DETECTED Final   Listeria monocytogenes NOT DETECTED NOT DETECTED Final   Staphylococcus species DETECTED (A) NOT DETECTED Final    Comment: Methicillin (oxacillin) resistant coagulase negative staphylococcus. Possible blood culture contaminant (unless isolated from more than one blood culture draw or clinical case suggests pathogenicity). No antibiotic treatment is indicated for blood  culture contaminants. CRITICAL RESULT CALLED TO, READ BACK BY AND VERIFIED WITH: Elenore Paddy PharmD 16:15 02/22/19 ([email or web address])    Staphylococcus aureus (BCID) NOT DETECTED NOT DETECTED Final   Methicillin resistance DETECTED (A) NOT DETECTED Final    Comment: CRITICAL RESULT CALLED TO, READ BACK BY AND VERIFIED WITH: Elenore Paddy PharmD 16:15 02/22/19 ([email or web address])    Streptococcus species NOT DETECTED NOT DETECTED Final   Streptococcus agalactiae NOT DETECTED NOT DETECTED Final   Streptococcus pneumoniae NOT DETECTED NOT DETECTED Final   Streptococcus pyogenes NOT DETECTED NOT DETECTED Final   Acinetobacter baumannii NOT DETECTED NOT DETECTED Final   Enterobacteriaceae species NOT DETECTED NOT DETECTED Final   Enterobacter cloacae complex NOT DETECTED NOT DETECTED Final   Escherichia coli NOT DETECTED NOT DETECTED Final   Klebsiella oxytoca NOT DETECTED NOT DETECTED Final   Klebsiella pneumoniae NOT DETECTED NOT DETECTED Final   Proteus species NOT DETECTED NOT DETECTED Final   Serratia marcescens NOT DETECTED NOT DETECTED Final   Haemophilus influenzae NOT DETECTED NOT DETECTED Final   Neisseria meningitidis NOT DETECTED NOT DETECTED Final   Pseudomonas aeruginosa NOT DETECTED NOT DETECTED Final   Candida albicans NOT DETECTED NOT DETECTED Final   Candida  glabrata NOT DETECTED NOT DETECTED Final   Candida krusei NOT DETECTED NOT DETECTED Final   Candida parapsilosis NOT DETECTED NOT DETECTED Final    Candida tropicalis NOT DETECTED NOT DETECTED Final    Comment: Performed at [facility], [place] [address] [place], [place] [address]  Culture, blood (Routine X 2) w Reflex to ID Panel     Status: None (Preliminary result)   Collection Time: 02/23/19 11:43 AM  Result Value Ref Range Status   Specimen Description   Final    BLOOD RIGHT ANTECUBITAL Performed at [facility] [address] [place], [place] [address]    Special Requests   Final    BOTTLES DRAWN AEROBIC AND ANAEROBIC Blood Culture adequate volume Performed at [facility] [address], [place], [place] [address]    Culture   Final    NO GROWTH 1 DAY Performed at [facility], [place] [address], [place], [place] [address]    Report Status PENDING  Incomplete  Culture, blood (Routine X 2) w Reflex to ID Panel     Status: None (Preliminary result)   Collection Time: 02/23/19 11:45 AM  Result Value Ref Range Status   Specimen Description   Final    BLOOD RIGHT ARM Performed at [facility] [address] [place], [place] [address]    Special Requests   Final    BOTTLES DRAWN AEROBIC AND ANAEROBIC Blood Culture adequate volume Performed at [facility] [address] [place], [place] [address]    Culture   Final    NO GROWTH 1 DAY Performed at [facility], [place] [address], [place], [place] [address]    Report Status PENDING  Incomplete    Studies/Results: No results found.   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  Uncontrolled DM (A1C 9.7%) Pacer, prev MI  Total days of antibiotics: 4 vanco  TEE (-) Would aim for 14 days of vanco Will need repeat BCx 1 week after completing vanco.  Repeat BCx 3-11 are ngtd Available as needed.   Allergies  Allergen Reactions  . Penicillins Swelling    "General  swelling" Tolerated cefepime & ceftriaxone previously. Has patient had a PCN reaction causing immediate rash, facial/tongue/throat swelling, SOB or lightheadedness with hypotension: Yes Has patient had a PCN reaction causing severe rash involving mucus membranes or skin necrosis: Unk Has patient had a PCN reaction that required hospitalization: Yes Has patient had a PCN reaction occurring within the last 10 years: No If all of the above answers are "NO", then may proceed with Cephalosporin  . Percocet [Oxycodone-Acetaminophen]     Itching  . Levaquin [Levofloxacin In D5w] Hives    OPAT Orders Discharge antibiotics: Per pharmacy protocol vanco Aim for Vancomycin trough 15-20 (unless otherwise indicated) Duration: 14 days End Date: 03-07-19  [facility] Care Per Protocol: please  Labs weekly while on IV antibiotics: _x_ CBC with differential __ BMP x__ CMP __ CRP __ ESR _x_ Vancomycin trough __ CK  _x_ Please pull PIC at completion of IV antibiotics __ Please leave PIC in place until doctor has seen patient or been notified  Fax weekly labs to [phone]  Clinic Follow Up Appt: PCP         Bobby Rumpf MD, FACP Infectious Diseases (pager) [phone] [email or web address] 02/25/2019, 11:33 AM  LOS: 4 days

## 2019-02-25 NOTE — CV Procedure (Signed)
     Transesophageal Echocardiogram Note  Wesley [name] [id number] [birth year]  Procedure: Transesophageal Echocardiogram Indications: bacteremia  Procedure Details Consent: Obtained Time Out: Verified patient identification, verified procedure, site/side was marked, verified correct patient position, special equipment/implants available, Radiology Safety Procedures followed,  medications/allergies/relevent history reviewed, required imaging and test results available.  Performed  Medications: IV Propofol   No endocarditis.   Complications:  Patient did tolerate procedure well.  Ena Dawley, MD, [facility] 02/25/2019, 12:47 PM

## 2019-02-25 NOTE — Transfer of Care (Signed)
Immediate Anesthesia Transfer of Care Note  Patient: Wesley [name]  Procedure(s) Performed: TRANSESOPHAGEAL ECHOCARDIOGRAM (TEE) (N/A )  Patient Location: PACU and Endoscopy Unit  Anesthesia Type:MAC  Level of Consciousness: drowsy  Airway & Oxygen Therapy: Patient Spontanous Breathing and Patient connected to nasal cannula oxygen  Post-op Assessment: Report given to RN and Post -op Vital signs reviewed and stable  Post vital signs: Reviewed and stable  Last Vitals:  Vitals Value Taken Time  BP 124/67 02/25/2019 12:45 PM  Temp    Pulse 68 02/25/2019 12:48 PM  Resp 14 02/25/2019 12:48 PM  SpO2 96 % 02/25/2019 12:48 PM  Vitals shown include unvalidated device data.  Last Pain:  Vitals:   02/25/19 1245  TempSrc:   PainSc: 0-No pain      Patients Stated Pain Goal: 1 (12/05/40 1464)  Complications: No apparent anesthesia complications

## 2019-02-25 NOTE — Interval H&P Note (Signed)
History and Physical Interval Note:  02/25/2019 11:32 AM  Wesley [name]  has presented today for surgery, with the diagnosis of BACTEREMIA, RENAL TUMOR.  The various methods of treatment have been discussed with the patient and family. After consideration of risks, benefits and other options for treatment, the patient has consented to  Procedure(s): TRANSESOPHAGEAL ECHOCARDIOGRAM (TEE) (N/A) as a surgical intervention.  The patient's history has been reviewed, patient examined, no change in status, stable for surgery.  I have reviewed the patient's chart and labs.  Questions were answered to the patient's satisfaction.     Ena Dawley

## 2019-02-25 NOTE — Anesthesia Preprocedure Evaluation (Addendum)
Anesthesia Evaluation  Patient identified by MRN, date of birth, ID band Patient awake    Reviewed: Allergy & Precautions, NPO status , Patient's Chart, lab work & pertinent test results  Airway Mallampati: II  TM Distance: >3 FB Neck ROM: Full    Dental no notable dental hx. (+) Teeth Intact   Pulmonary shortness of breath, sleep apnea , COPD, former smoker,    Pulmonary exam normal breath sounds clear to auscultation       Cardiovascular hypertension, + CAD and + Past MI  Normal cardiovascular exam+ pacemaker  Rhythm:Regular Rate:Normal  3/12?2020 Echo 1. The left ventricle has normal systolic function, with an ejection fraction of 55-60%. The cavity size was normal. Left ventricular diastolic Doppler parameters are consistent with impaired relaxation.  2. The right ventricle has normal systolic function. The cavity was normal. There is no increase in right ventricular wall thickness.   Neuro/Psych Anxiety    GI/Hepatic GERD  ,  Endo/Other  diabetes  Renal/GU      Musculoskeletal   Abdominal   Peds  Hematology   Anesthesia Other Findings   Reproductive/Obstetrics                            Anesthesia Physical Anesthesia Plan  ASA: III  Anesthesia Plan: MAC   Post-op Pain Management:    Induction: Intravenous  PONV Risk Score and Plan: Treatment may vary due to age or medical condition  Airway Management Planned: Nasal Cannula and Natural Airway  Additional Equipment:   Intra-op Plan:   Post-operative Plan:   Informed Consent: I have reviewed the patients History and Physical, chart, labs and discussed the procedure including the risks, benefits and alternatives for the proposed anesthesia with the patient or authorized representative who has indicated his/her understanding and acceptance.     Dental advisory given  Plan Discussed with:   Anesthesia Plan Comments:          Anesthesia Quick Evaluation

## 2019-02-26 ENCOUNTER — Encounter (HOSPITAL_COMMUNITY): Payer: Self-pay | Admitting: Cardiology

## 2019-02-26 ENCOUNTER — Inpatient Hospital Stay: Payer: Self-pay

## 2019-02-26 LAB — CBC
HCT: 38.2 % — ABNORMAL LOW (ref 39.0–52.0)
Hemoglobin: 11.9 g/dL — ABNORMAL LOW (ref 13.0–17.0)
MCH: 26.4 pg (ref 26.0–34.0)
MCHC: 31.2 g/dL (ref 30.0–36.0)
MCV: 84.7 fL (ref 80.0–100.0)
Platelets: 234 10*3/uL (ref 150–400)
RBC: 4.51 MIL/uL (ref 4.22–5.81)
RDW: 13.9 % (ref 11.5–15.5)
WBC: 7.5 10*3/uL (ref 4.0–10.5)
nRBC: 0 % (ref 0.0–0.2)

## 2019-02-26 LAB — BASIC METABOLIC PANEL
Anion gap: 9 (ref 5–15)
BUN: 14 mg/dL (ref 8–23)
CO2: 24 mmol/L (ref 22–32)
Calcium: 8.9 mg/dL (ref 8.9–10.3)
Chloride: 103 mmol/L (ref 98–111)
Creatinine, Ser: 0.82 mg/dL (ref 0.61–1.24)
GFR calc Af Amer: >60 mL/min (ref >60)
GFR calc non Af Amer: >60 mL/min (ref >60)
Glucose, Bld: 182 mg/dL — ABNORMAL HIGH (ref 70–99)
Potassium: 3.6 mmol/L (ref 3.5–5.1)
Sodium: 136 mmol/L (ref 135–145)

## 2019-02-26 LAB — GLUCOSE, CAPILLARY
GLUCOSE-CAPILLARY: 219 mg/dL — AB (ref 70–99)
Glucose-Capillary: 144 mg/dL — ABNORMAL HIGH (ref 70–99)
Glucose-Capillary: 200 mg/dL — ABNORMAL HIGH (ref 70–99)
Glucose-Capillary: 296 mg/dL — ABNORMAL HIGH (ref 70–99)

## 2019-02-26 LAB — MAGNESIUM: Magnesium: 2 mg/dL (ref 1.7–2.4)

## 2019-02-26 MED ORDER — MAGNESIUM CITRATE PO SOLN
1.0000 | Freq: Once | ORAL | Status: AC
  Administered 2019-02-26: 1 via ORAL
  Filled 2019-02-26: qty 296

## 2019-02-26 MED ORDER — VANCOMYCIN IV (FOR PTA / DISCHARGE USE ONLY): 1500.0000 mg | Freq: Two times a day (BID) | INTRAVENOUS | 0 refills | Status: DC

## 2019-02-26 NOTE — Progress Notes (Signed)
Spoke with Jefferey Pica RN re PICC.  Notified PICC to be done in am 02-27-2019.

## 2019-02-26 NOTE — Progress Notes (Signed)
PROGRESS NOTE    Wesley [name]  NWG:[id number] DOB: 1956-04-09 DOA: 02/21/2019 PCP: [facility]   Brief Narrative:   63 year old with a history of AAA, anxiety, BPH, left renal pelvis carcinoma, COPD with emphysema, CAD, dementia, diabetes mellitus type 2, hyperlipidemia, obstructive sleep apnea, CVA with residual left-sided weakness, depression came to the hospital for evaluation of lethargy and confusion.  He was also found to have fevers and chills.  Recently had a left-sided ureteral stent placed on 02/18/2019.  Urology was consulted.  Admitted for pyelonephritis treatment.  His work-up was significant for Staphylococcus epidermidis bacteremia, and pathogen was growing from his urine as well, his TEE was negative for any vegetation.  Subjective:  Report he is feeling better today, still reports some constipation with no bowel movements, reports abdominal pain has significantly subsided.   Assessment & Plan:   Principal Problem:   Sepsis ([place]) Active Problems:   Cardiac pacemaker in situ   Essential hypertension   COPD mixed type ([place])   Tobacco use disorder   Anxiety and depression   BPH (benign prostatic hyperplasia)   History of substance abuse ([place])   SSS (sick sinus syndrome) (H[facility])   History of stroke   Diabetes mellitus type 2 in obese ([facility])   Obstructive sleep apnea   Metabolic encephalopathy   Renal and perinephric abscess   Acute pyelonephritis   Bacteremia   Staphylococcus epidermidis sepsis ([facility])  Sepsis secondary to acute pyelonephritis, collection, and Staphylococcus  epidermidis bacteremia - sepsis present prior to admission - CT of the abdomen pelvis is consistent with pyelonephritis with questionable surrounding fluid collection - some  concerns for perinephric abscess hemorrhage.  Culture growing Staphylococcus epidermidis as well blood cultures, I have discussed with urology, there is no indication to repeat imaging as he is nontoxic-appearing  and responding well to IV antibiotic regimen, and he is afebrile . -Empirically on vancomycin and cefepime, stopped cefepime 02/23/2019 . -Have discussed with urology, continue with IV antibiotics regimen, he will follow as an outpatient regarding vent removal  Staphylococcus epidermidis bacteremia -Continue with IV vancomycin, ID input greatly appreciated, TEE with no evidence of endocarditis, will need total of 2 weeks treatment of IV vancomycin with stop date 03/07/2019, surveillance blood culture has been negative, so we will place  PICC line. - ID input greatly appreciated   Essential hypertension -continue with Cardizem 180 mg daily, better controlled after starting hydrochlorothiazide continue with PRN hydralazine   History of COPD, stable -Continue home bronchodilators or its equivalent here.  Currently stable  Diabetes mellitus type 2, insulin-dependent Peripheral neuropathy secondary diabetes mellitus type 2 -Continue Lantus 33 units daily, insulin sliding scale and Accu-Chek. -Continue gabapentin  Tobacco use -Nicotine patch if necessary  History of depression and substance abuse -Resume home medications  History of sick sinus syndrome with pacemaker in place - On Cardizem.  Continue to monitor.  History of seizure -Continue home medications including Keppra  History of nonhemorrhagic CVA with mild left-sided residual defect, nonhemorrhagic -Continue aspirin and statin  BPH -On Flomax and finasteride  Reports some constipation, will start on stool softeners  DVT prophylaxis: SCDs,subcu Lovenox  Code Status: Full code Family Communication: None at bedside Disposition Plan: Mains on IV antibiotics with need of further work-up  Consultants:   Urology  ID  Procedures:   None  Antimicrobials:   Aztreonam stopped 3/10, Flagyl stopped 3/10  Vancomycin 3/9 >  Cefepime 3/10>3/11   Objective: Vitals:   02/25/19 2246 02/26/19 0408 02/26/19  0742 02/26/19  0954  BP:  (!) 179/95  (!) 168/88  Pulse:  67    Resp:  18    Temp:  98.7 F (37.1 C)    TempSrc:  Oral    SpO2: 96% 97% 96%   Weight:      Height:        Intake/Output Summary (Last 24 hours) at 02/26/2019 1309 Last data filed at 02/26/2019 1200 Gross per 24 hour  Intake 3009.97 ml  Output 4100 ml  Net -1090.03 ml   Filed Weights   02/21/19 0615  Weight: 97.5 kg    Examination:  Awake Alert, Oriented X 3, No new F.N deficits, Normal affect Symmetrical Chest wall movement, Good air movement bilaterally, CTAB RRR,No Gallops,Rubs or new Murmurs, No Parasternal Heave +ve B.Sounds, Abd Soft, No tenderness, No rebound - guarding or rigidity. No Cyanosis, Clubbing or edema, No new Rash or bruise        Data Reviewed:   CBC: Recent Labs  Lab 02/21/19 0626 02/22/19 0554 02/23/19 0558 02/24/19 0555 02/25/19 0145 02/26/19 0538  WBC 11.6* 11.6* 8.6 7.0 6.7 7.5  NEUTROABS 8.3*  --   --   --   --   --   HGB 14.0 11.6* 10.9* 11.1* 11.1* 11.9*  HCT 42.8 38.9* 35.5* 36.1* 36.9* 38.2*  MCV 86.1 88.8 87.2 86.2 86.6 84.7  PLT 205 177 169 193 178 578   Basic Metabolic Panel: Recent Labs  Lab 02/22/19 0554 02/23/19 0558 02/24/19 0555 02/25/19 0145 02/26/19 0538  NA 139 138 140 139 136  K 3.7 3.7 3.6 3.6 3.6  CL 106 107 109 105 103  CO2 24 25 25 24 24   GLUCOSE 166* 200* 154* 191* 182*  BUN 18 15 13 13 14   CREATININE 0.80 0.86 0.75 0.77 0.82  CALCIUM 8.0* 8.3* 8.6* 8.8* 8.9  MG  --  1.9 1.9 1.9 2.0   GFR: Estimated Creatinine Clearance: 108.6 mL/min (by C-G formula based on SCr of 0.82 mg/dL). Liver Function Tests: Recent Labs  Lab 02/21/19 0626  AST 10*  ALT 25  ALKPHOS 137*  BILITOT 1.0  PROT 7.6  ALBUMIN 4.0   Recent Labs  Lab 02/21/19 0626  LIPASE 27   No results for input(s): AMMONIA in the last 168 hours. Coagulation Profile: Recent Labs  Lab 02/21/19 0626  INR 1.0   Cardiac Enzymes: No results for input(s): CKTOTAL, CKMB, CKMBINDEX,  TROPONINI in the last 168 hours. BNP (last 3 results) No results for input(s): PROBNP in the last 8760 hours. HbA1C: No results for input(s): HGBA1C in the last 72 hours. CBG: Recent Labs  Lab 02/25/19 1425 02/25/19 1651 02/25/19 2115 02/26/19 0745 02/26/19 1148  GLUCAP 105* 205* 193* 144* 219*   Lipid Profile: No results for input(s): CHOL, HDL, LDLCALC, TRIG, CHOLHDL, LDLDIRECT in the last 72 hours. Thyroid Function Tests: No results for input(s): TSH, T4TOTAL, FREET4, T3FREE, THYROIDAB in the last 72 hours. Anemia Panel: No results for input(s): VITAMINB12, FOLATE, FERRITIN, TIBC, IRON, RETICCTPCT in the last 72 hours. Sepsis Labs: Recent Labs  Lab 02/21/19 0626 02/21/19 0800  LATICACIDVEN 1.1 1.0    Recent Results (from the past 240 hour(s))  Culture, blood (Routine x 2)     Status: Abnormal   Collection Time: 02/21/19  6:26 AM  Result Value Ref Range Status   Specimen Description   Final    BLOOD RIGHT HAND Performed at [facility] [address] [place], [place] [address]  Special Requests   Final    BOTTLES DRAWN AEROBIC AND ANAEROBIC Blood Culture results may not be optimal due to an excessive volume of blood received in culture bottles Performed at [facility] [address], [place], [place] [address]    Culture  Setup Time   Final    GRAM POSITIVE COCCI CRITICAL RESULT CALLED TO, READ BACK BY AND VERIFIED WITH: Elenore Paddy PharmD 16:15 02/22/19 ([email or web address]) AEROBIC BOTTLE ONLY Performed at [facility], [place] [address], [place], [place] [address]    Culture STAPHYLOCOCCUS EPIDERMIDIS (A)  Final   Report Status 02/24/2019 FINAL  Final   Organism ID, Bacteria STAPHYLOCOCCUS EPIDERMIDIS  Final      Susceptibility   Staphylococcus epidermidis - MIC*    CIPROFLOXACIN >=8 RESISTANT Resistant     ERYTHROMYCIN >=8 RESISTANT Resistant     GENTAMICIN >=16 RESISTANT Resistant     OXACILLIN >=4 RESISTANT Resistant      TETRACYCLINE 2 SENSITIVE Sensitive     VANCOMYCIN 1 SENSITIVE Sensitive     TRIMETH/SULFA 160 RESISTANT Resistant     CLINDAMYCIN >=8 RESISTANT Resistant     RIFAMPIN <=0.5 SENSITIVE Sensitive     Inducible Clindamycin NEGATIVE Sensitive     * STAPHYLOCOCCUS EPIDERMIDIS  Culture, blood (Routine x 2)     Status: Abnormal   Collection Time: 02/21/19  6:26 AM  Result Value Ref Range Status   Specimen Description   Final    BLOOD LEFT FOREARM Performed at [facility], [address], [place], [place] [address]    Special Requests   Final    BOTTLES DRAWN AEROBIC AND ANAEROBIC Blood Culture results may not be optimal due to an excessive volume of blood received in culture bottles Performed at [address] [place], [place] [address]    Culture  Setup Time   Final    GRAM POSITIVE COCCI CRITICAL VALUE NOTED.  VALUE IS CONSISTENT WITH PREVIOUSLY REPORTED AND CALLED VALUE. AEROBIC BOTTLE ONLY    Culture (A)  Final    STAPHYLOCOCCUS EPIDERMIDIS SUSCEPTIBILITIES PERFORMED ON PREVIOUS CULTURE WITHIN THE LAST 5 DAYS. Performed at [facility], [place] [address], [place], [place] [address]    Report Status 02/24/2019 FINAL  Final  Urine culture     Status: Abnormal   Collection Time: 02/21/19  6:26 AM  Result Value Ref Range Status   Specimen Description   Final    URINE, CLEAN CATCH Performed at [facility], [place] [address], [place], [place] [address]    Special Requests   Final    NONE Performed at [facility], [place] [address] [place], [place] [address]    Culture >=100,000 COLONIES/mL STAPHYLOCOCCUS EPIDERMIDIS (A)  Final   Report Status 02/23/2019 FINAL  Final   Organism ID, Bacteria STAPHYLOCOCCUS EPIDERMIDIS (A)  Final      Susceptibility   Staphylococcus epidermidis - MIC*    CIPROFLOXACIN >=8 RESISTANT Resistant     GENTAMICIN >=16 RESISTANT Resistant     NITROFURANTOIN <=16 SENSITIVE  Sensitive     OXACILLIN >=4 RESISTANT Resistant     TETRACYCLINE 2 SENSITIVE Sensitive     VANCOMYCIN 1 SENSITIVE Sensitive     TRIMETH/SULFA 80 RESISTANT Resistant     CLINDAMYCIN >=8 RESISTANT Resistant     RIFAMPIN <=0.5 SENSITIVE Sensitive     Inducible Clindamycin NEGATIVE Sensitive     * >=100,000 COLONIES/mL STAPHYLOCOCCUS EPIDERMIDIS  Blood Culture ID  Panel (Reflexed)     Status: Abnormal   Collection Time: 02/21/19  6:26 AM  Result Value Ref Range Status   Enterococcus species NOT DETECTED NOT DETECTED Final   Listeria monocytogenes NOT DETECTED NOT DETECTED Final   Staphylococcus species DETECTED (A) NOT DETECTED Final    Comment: Methicillin (oxacillin) resistant coagulase negative staphylococcus. Possible blood culture contaminant (unless isolated from more than one blood culture draw or clinical case suggests pathogenicity). No antibiotic treatment is indicated for blood  culture contaminants. CRITICAL RESULT CALLED TO, READ BACK BY AND VERIFIED WITH: Elenore Paddy PharmD 16:15 02/22/19 ([email or web address])    Staphylococcus aureus (BCID) NOT DETECTED NOT DETECTED Final   Methicillin resistance DETECTED (A) NOT DETECTED Final    Comment: CRITICAL RESULT CALLED TO, READ BACK BY AND VERIFIED WITH: Elenore Paddy PharmD 16:15 02/22/19 ([email or web address])    Streptococcus species NOT DETECTED NOT DETECTED Final   Streptococcus agalactiae NOT DETECTED NOT DETECTED Final   Streptococcus pneumoniae NOT DETECTED NOT DETECTED Final   Streptococcus pyogenes NOT DETECTED NOT DETECTED Final   Acinetobacter baumannii NOT DETECTED NOT DETECTED Final   Enterobacteriaceae species NOT DETECTED NOT DETECTED Final   Enterobacter cloacae complex NOT DETECTED NOT DETECTED Final   Escherichia coli NOT DETECTED NOT DETECTED Final   Klebsiella oxytoca NOT DETECTED NOT DETECTED Final   Klebsiella pneumoniae NOT DETECTED NOT DETECTED Final   Proteus species NOT DETECTED NOT DETECTED Final   Serratia marcescens NOT  DETECTED NOT DETECTED Final   Haemophilus influenzae NOT DETECTED NOT DETECTED Final   Neisseria meningitidis NOT DETECTED NOT DETECTED Final   Pseudomonas aeruginosa NOT DETECTED NOT DETECTED Final   Candida albicans NOT DETECTED NOT DETECTED Final   Candida glabrata NOT DETECTED NOT DETECTED Final   Candida krusei NOT DETECTED NOT DETECTED Final   Candida parapsilosis NOT DETECTED NOT DETECTED Final   Candida tropicalis NOT DETECTED NOT DETECTED Final    Comment: Performed at [facility], [address], [place], [place] [address]  Culture, blood (Routine X 2) w Reflex to ID Panel     Status: None (Preliminary result)   Collection Time: 02/23/19 11:43 AM  Result Value Ref Range Status   Specimen Description   Final    BLOOD RIGHT ANTECUBITAL Performed at [facility] [address] [place], [place] [address]    Special Requests   Final    BOTTLES DRAWN AEROBIC AND ANAEROBIC Blood Culture adequate volume Performed at [facility] [address] [place], [place] [address]    Culture   Final    NO GROWTH 2 DAYS Performed at [facility] [address], [place], [place] [address]    Report Status PENDING  Incomplete  Culture, blood (Routine X 2) w Reflex to ID Panel     Status: None (Preliminary result)   Collection Time: 02/23/19 11:45 AM  Result Value Ref Range Status   Specimen Description   Final    BLOOD RIGHT ARM Performed at [facility] [address], [place], [place] [address]    Special Requests   Final    BOTTLES DRAWN AEROBIC AND ANAEROBIC Blood Culture adequate volume Performed at [facility] [address] [place], [place] [address]    Culture   Final    NO GROWTH 2 DAYS Performed at [facility] [address] [place], [place] [address]    Report Status PENDING  Incomplete  Radiology Studies: [place] Ekg Site Rite  Result Date: 02/26/2019 If [facility] image not attached, placement could not be confirmed due to current cardiac rhythm.       Scheduled Meds:  albuterol  2.5 mg Nebulization BID   arformoterol  15 mcg Nebulization BID   And   umeclidinium bromide  1 puff Inhalation Daily   aspirin  325 mg Oral Daily   atorvastatin  40 mg Oral QHS   brimonidine  1 drop Both Eyes QHS   budesonide  0.25 mg Nebulization BID   cholecalciferol  2,000 Units Oral Daily   diltiazem  180 mg Oral Daily   enoxaparin (LOVENOX) injection  40 mg Subcutaneous Q24H   feeding supplement (ENSURE ENLIVE)  237 mL Oral Q24H   finasteride  5 mg Oral Daily   gabapentin  300 mg Oral TID   hydrALAZINE  25 mg Oral Q8H   hydrochlorothiazide  25 mg Oral Daily   insulin aspart  0-20 Units Subcutaneous TID WC   insulin aspart  0-5 Units Subcutaneous QHS   insulin glargine  33 Units Subcutaneous QHS   latanoprost  1 drop Both Eyes QHS   levETIRAcetam  750 mg Oral BID   loratadine  10 mg Oral Daily   lurasidone  20 mg Oral QHS   mirabegron ER  25 mg Oral Daily   mirtazapine  7.5 mg Oral QHS   nicotine  14 mg Transdermal Daily   pantoprazole  40 mg Oral BID AC   polyethylene glycol  17 g Oral Daily   polyvinyl alcohol  1 drop Both Eyes QHS   senna-docusate  3 tablet Oral BID   tamsulosin  0.4 mg Oral QPC supper   venlafaxine XR  150 mg Oral Q breakfast   Continuous Infusions:  sodium chloride 50 mL/hr at 02/26/19 0600   vancomycin 1,500 mg (02/26/19 0959)     LOS: 5 days      Phillips Climes, MD [facility]  If 7PM-7AM, please contact night-coverage [email or web address] 02/26/2019, 1:09 PM

## 2019-02-26 NOTE — TOC Progression Note (Addendum)
Transition of Care [facility]) - Progression Note    Patient Details  Name: JOSHWA HEMRIC MRN: [id number] Date of Birth: [birth year]  Transition of [facility]) CM/SW Contact  Leeroy Cha, RN Phone Number: 02/26/2019, 11:54 AM  Clinical Narrative:    [facility] jfor home iv vancomycin message for representative to call back. Per Carolynn Sayers with [facility] will be doing the iv infusion but can not get authorization until Monday from the [facility] /Dr.Elgergawy made aware of this. PiCC line to be placed in the am [id number].    Expected Discharge Plan: Home/Self Care Barriers to Discharge: No Barriers Identified  Expected Discharge Plan and Services Expected Discharge Plan: Home/Self Care Discharge Planning Services: CM Consult     Expected Discharge Date: (unknown)                         Social Determinants of Health (SDOH) Interventions    Readmission Risk Interventions 30 Day Unplanned Readmission Risk Score     ED to Hosp-Admission (Current) from 02/21/2019 in [facility]  30 Day Unplanned Readmission Risk Score (%)  31 Filed at 02/26/2019 0800     This score is the patient's risk of an unplanned readmission within 30 days of being discharged (0 -100%). The score is based on dignosis, age, lab data, medications, orders, and past utilization.   Low:  0-14.9   Medium: 15-21.9   High: 22-29.9   Extreme: 30 and above       Readmission Risk Prevention Plan 02/23/2019  Transportation Screening Complete  Medication Review Press [employer]) Complete  PCP or Specialist appointment within 3-5 days of discharge Complete  HRI or [place] Complete  SW Recovery Care/Counseling Consult Complete  [place] Not Applicable  Some recent data might be hidden

## 2019-02-26 NOTE — Progress Notes (Signed)
Spoke with Jefferey Pica RN re PICC placement in the am.  States no home health has been arranged at this time. Will continue with placement 02/27/19 as per discussion.

## 2019-02-26 NOTE — Progress Notes (Signed)
1 Day Post-Op   Subjective/Chief Complaint:  1 - Low Grade Left Renal Pelvis Tumor - Left upper pole papillary tumor found on eval gross hematuria. BX 12/2018 confirms low grade. He underwent 1st stage ablation on 02/03/19, 2nd stage and stent exchange 02/18/19.  2 - Urosepsis - new fevers to 102, confusion, tachycardia 2 days after ureteroscopy for renal pelvis tumor. CT with stent in good position, some perinephric fluid (expected). UCX, [place] 3/9 MRSE. Placed on empiric Vanc + Cefipime. A1c 9's. Narrowed to IV Vanc with plan to DC on until 3/23.   Today " [name] " is stable. Afebrile now. Cardiac eval w/o vegetations.     Objective: Vital signs in last 24 hours: Temp:  [98.1 F (36.7 C)-98.7 F (37.1 C)] 98.7 F (37.1 C) (03/14 0408) Pulse Rate:  [59-68] 67 (03/14 0408) Resp:  [13-21] 18 (03/14 0408) BP: (124-187)/(67-108) 179/95 (03/14 0408) SpO2:  [96 %-98 %] 96 % (03/14 0742) Last BM Date: 02/17/19  Intake/Output from previous day: 03/13 0701 - 03/14 0700 In: 3510 [P.O.:510; I.V.:1500; IV Piggyback:1500] Out: 2900 [Urine:2900] Intake/Output this shift: No intake/output data recorded.   General appearance: alert, cooperative and at baseline.  Head: Normocephalic, without obvious abnormality, atraumatic Nose: Nares normal. Septum midline. Mucosa normal. No drainage or sinus tenderness. Throat: lips, mucosa, and tongue normal; teeth and gums normal Neck: supple, symmetrical, trachea midline Back: symmetric, no curvature. ROM normal. No CVA tenderness. Resp: non-labored on minimal [place] O2 Cardio: Nl rate at present.  Male genitalia: normal, foley in place with yellow urine that is non-foul.  Extremities: extremities normal, atraumatic, no cyanosis or edema Pulses: 2+ and symmetric Skin: Skin color, texture, turgor normal. No rashes or lesions Lymph nodes: Cervical, supraclavicular, and axillary nodes normal. Neurologic: Grossly normal   Lab Results:  Recent Labs     02/25/19 0145 02/26/19 0538  WBC 6.7 7.5  HGB 11.1* 11.9*  HCT 36.9* 38.2*  PLT 178 234   BMET Recent Labs    02/25/19 0145 02/26/19 0538  NA 139 136  K 3.6 3.6  CL 105 103  CO2 24 24  GLUCOSE 191* 182*  BUN 13 14  CREATININE 0.77 0.82  CALCIUM 8.8* 8.9   PT/INR No results for input(s): LABPROT, INR in the last 72 hours. ABG No results for input(s): PHART, HCO3 in the last 72 hours.  Invalid input(s): PCO2, PO2  Studies/Results: No results found.  Anti-infectives: Anti-infectives (From admission, onward)   Start     Dose/Rate Route Frequency Ordered Stop   02/25/19 2200  vancomycin (VANCOCIN) 1,500 mg in sodium chloride 0.9 % 500 mL IVPB  Status:  Discontinued     1,500 mg 250 mL/hr over 120 Minutes Intravenous Every 12 hours 02/25/19 1335 02/25/19 1359   02/25/19 1400  vancomycin (VANCOCIN) 1,500 mg in sodium chloride 0.9 % 500 mL IVPB     1,500 mg 250 mL/hr over 120 Minutes Intravenous 2 times daily 02/25/19 1359     02/23/19 1600  rifampin (RIFADIN) capsule 300 mg  Status:  Discontinued     300 mg Oral Every 12 hours 02/23/19 1238 02/23/19 1250   02/22/19 1200  vancomycin (VANCOCIN) 1,750 mg in sodium chloride 0.9 % 500 mL IVPB  Status:  Discontinued     1,750 mg 250 mL/hr over 120 Minutes Intravenous Every 12 hours 02/22/19 1054 02/25/19 1335   02/21/19 1600  ceFEPIme (MAXIPIME) 2 g in sodium chloride 0.9 % 100 mL IVPB  Status:  Discontinued  2 g 200 mL/hr over 30 Minutes Intravenous Every 8 hours 02/21/19 1223 02/23/19 1038   02/21/19 1400  aztreonam (AZACTAM) injection 2 g  Status:  Discontinued     2 g Intramuscular Every 8 hours 02/21/19 1036 02/21/19 1222   02/21/19 0715  vancomycin (VANCOCIN) 2,000 mg in sodium chloride 0.9 % 500 mL IVPB     2,000 mg 250 mL/hr over 120 Minutes Intravenous  Once 02/21/19 0659 02/22/19 1058   02/21/19 0715  aztreonam (AZACTAM) 2 g in sodium chloride 0.9 % 100 mL IVPB     2 g 200 mL/hr over 30 Minutes Intravenous STAT  02/21/19 0700 02/21/19 0947   02/21/19 0700  metroNIDAZOLE (FLAGYL) IVPB 500 mg  Status:  Discontinued     500 mg 100 mL/hr over 60 Minutes Intravenous Every 8 hours 02/21/19 0654 02/22/19 1004      Assessment/Plan:  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 Stent in good postioin, will NOT manipulation in setting of infection.  2 - Urosepsis - resolved systemic response. Appreciate ID recs for outpatient IV vanco.   OK to DC foley.   Do not favor addiitonal drianage / procedures unless deteriorates, then consider perinerphic drain / aspiration. OK do DC foley when afebrile x 24 hours.   He has GU follow up arranged.   Please call me directly anytime with questions.   Alexis Frock 02/26/2019

## 2019-02-27 LAB — CBC
HCT: 39.5 % (ref 39.0–52.0)
Hemoglobin: 12.1 g/dL — ABNORMAL LOW (ref 13.0–17.0)
MCH: 26.4 pg (ref 26.0–34.0)
MCHC: 30.6 g/dL (ref 30.0–36.0)
MCV: 86.2 fL (ref 80.0–100.0)
Platelets: 274 10*3/uL (ref 150–400)
RBC: 4.58 MIL/uL (ref 4.22–5.81)
RDW: 13.9 % (ref 11.5–15.5)
WBC: 8.2 10*3/uL (ref 4.0–10.5)
nRBC: 0 % (ref 0.0–0.2)

## 2019-02-27 LAB — BASIC METABOLIC PANEL
Anion gap: 10 (ref 5–15)
BUN: 14 mg/dL (ref 8–23)
CO2: 24 mmol/L (ref 22–32)
Calcium: 9.2 mg/dL (ref 8.9–10.3)
Chloride: 104 mmol/L (ref 98–111)
Creatinine, Ser: 0.91 mg/dL (ref 0.61–1.24)
GFR calc Af Amer: >60 mL/min (ref >60)
GFR calc non Af Amer: >60 mL/min (ref >60)
Glucose, Bld: 201 mg/dL — ABNORMAL HIGH (ref 70–99)
Potassium: 4 mmol/L (ref 3.5–5.1)
Sodium: 138 mmol/L (ref 135–145)

## 2019-02-27 LAB — GLUCOSE, CAPILLARY
GLUCOSE-CAPILLARY: 173 mg/dL — AB (ref 70–99)
Glucose-Capillary: 164 mg/dL — ABNORMAL HIGH (ref 70–99)
Glucose-Capillary: 195 mg/dL — ABNORMAL HIGH (ref 70–99)
Glucose-Capillary: 197 mg/dL — ABNORMAL HIGH (ref 70–99)

## 2019-02-27 LAB — MAGNESIUM: Magnesium: 2.1 mg/dL (ref 1.7–2.4)

## 2019-02-27 MED ORDER — SODIUM CHLORIDE 0.9% FLUSH
10.0000 mL | INTRAVENOUS | Status: DC | PRN
  Administered 2019-02-28: 10 mL
  Filled 2019-02-27: qty 40

## 2019-02-27 MED ORDER — SODIUM CHLORIDE 0.9 % IV SOLN: INTRAVENOUS | Status: DC

## 2019-02-27 MED ORDER — SODIUM CHLORIDE 0.9% FLUSH: 10.0000 mL | Freq: Two times a day (BID) | INTRAVENOUS | Status: DC

## 2019-02-27 NOTE — Progress Notes (Signed)
Occupational Therapy Treatment Patient Details Name: Wesley [name] MRN: [id number] DOB: 13-Mar-1956 Today's Date: 02/27/2019    History of present illness 63 year old with a history of AAA, anxiety, BPH, left renal pelvis carcinoma, COPD with emphysema, CAD, dementia, diabetes mellitus type 2, hyperlipidemia, obstructive sleep apnea, CVA with residual left-sided weakness, depression came to the hospital for evaluation of lethargy and confusion.  He was also found to have fevers and chills.  Recently had a left-sided ureteral stent placed on 02/18/2019.  Urology was consulted.  Admitted for pyelonephritis treatment.  His work-up was significant for Staphylococcus epidermidis bacteremia.   OT comments   PATIENT WAS COOPERATIVE WITH THERAPY AND PERFORMED WELL. PATIENT WORKED ON ADLS AND ADL TRANSFERS. PATIENT WAS ABLE TO STAND FOR 3-4 MIN FOR GROOMING TASKS. PATIENT WAS HAVING PAIN IN ABDOMEN BUT DID NOT WANT PAIN MEDS. ACUTE OT TO FOLLOW.   Follow Up Recommendations       Equipment Recommendations       Recommendations for Other Services      Precautions / Restrictions Precautions Precautions: Fall Restrictions Weight Bearing Restrictions: No       Mobility Bed Mobility           Sit to supine: Supervision      Transfers       Sit to Stand: Supervision         General transfer comment: MIN GUARD ASSIST TO AMB INTO BATHROOM    Balance                                           ADL either performed or assessed with clinical judgement   ADL       Grooming: Wash/dry hands;Wash/dry face;Supervision/safety;Standing               Lower Body Dressing: Supervision/safety;Set up;Sit to/from stand   Toilet Transfer: Min guard;Ambulation   Toileting- Clothing Manipulation and Hygiene: Supervision/safety       Functional mobility during ADLs: Min guard;Rolling walker General ADL Comments: PATIENT WAS S TO MIN GUARD ASSIST FOR ADLS  AND  TRANSFER TO COMMODE.     Vision       Perception     Praxis      Cognition Arousal/Alertness: Awake/alert Behavior During Therapy: WFL for tasks assessed/performed Overall Cognitive Status: Within Functional Limits for tasks assessed                                          Exercises     Shoulder Instructions       General Comments      Pertinent Vitals/ Pain       Pain Assessment: 0-10 Pain Score: 5  Pain Location: stomach Pain Descriptors / Indicators: Aching Pain Intervention(s): Limited activity within patient's tolerance;Monitored during session(patient did not want pain medicine)  Home Living                                          Prior Functioning/Environment              Frequency           Progress Toward Goals  OT Goals(current goals can now be found  in the care plan section)     Acute Rehab OT Goals Patient Stated Goal: to go home  Plan Discharge plan remains appropriate    Co-evaluation                 AM-PAC OT "6 Clicks" Daily Activity     Outcome Measure   Help from another person eating meals?: None Help from another person taking care of personal grooming?: None Help from another person toileting, which includes using toliet, bedpan, or urinal?: A Little Help from another person bathing (including washing, rinsing, drying)?: A Little Help from another person to put on and taking off regular upper body clothing?: None Help from another person to put on and taking off regular lower body clothing?: A Little 6 Click Score: 21    End of Session Equipment Utilized During Treatment: Rolling walker  OT Visit Diagnosis: Unsteadiness on feet (R26.81);Pain;Muscle weakness (generalized) (M62.81);History of falling (Z91.81)   Activity Tolerance Patient tolerated treatment well   Patient Left in bed;with call bell/phone within reach;with bed alarm set   Nurse Communication (OK THERAPY)         Time: 1610-9604 OT Time Calculation (min): 29 min  Charges: OT General Charges $OT Visit: 1 Visit OT Treatments $Self Care/Home Management : 54-09 mins  6 CLICKS   Wesley [name] 02/27/2019, 11:18 AM

## 2019-02-27 NOTE — Progress Notes (Signed)
PROGRESS NOTE    Wesley [name]  TTS:[id number] DOB: 1956/06/11 DOA: 02/21/2019 PCP: [facility]   Brief Narrative:   63 year old with a history of AAA, anxiety, BPH, left renal pelvis carcinoma, COPD with emphysema, CAD, dementia, diabetes mellitus type 2, hyperlipidemia, obstructive sleep apnea, CVA with residual left-sided weakness, depression came to the hospital for evaluation of lethargy and confusion.  He was also found to have fevers and chills.  Recently had a left-sided ureteral stent placed on 02/18/2019.  Urology was consulted.  Admitted for pyelonephritis treatment.  His work-up was significant for Staphylococcus epidermidis bacteremia, and pathogen was growing from his urine as well, his TEE was negative for any vegetation.  Subjective:  Patient denies any complaints today, reports he had a good bowel movement yesterday after receiving magnesium citrate    Assessment & Plan:   Principal Problem:   Sepsis ([place]) Active Problems:   Cardiac pacemaker in situ   Essential hypertension   COPD mixed type ([place])   Tobacco use disorder   Anxiety and depression   BPH (benign prostatic hyperplasia)   History of substance abuse ([place])   SSS (sick sinus syndrome) ([facility])   History of stroke   Diabetes mellitus type 2 in obese ([facility])   Obstructive sleep apnea   Metabolic encephalopathy   Renal and perinephric abscess   Acute pyelonephritis   Bacteremia   Staphylococcus epidermidis sepsis ([facility])  Sepsis secondary to acute pyelonephritis, collection, and Staphylococcus  epidermidis bacteremia - sepsis present prior to admission - CT of the abdomen pelvis is consistent with pyelonephritis with questionable surrounding fluid collection - some  concerns for perinephric abscess hemorrhage.  Culture growing Staphylococcus epidermidis as well blood cultures, I have discussed with urology, there is no indication to repeat imaging as he is nontoxic-appearing and responding well to  IV antibiotic regimen, and he is afebrile . -Empirically on vancomycin and cefepime, stopped cefepime 02/23/2019 . -Have discussed with urology, continue with IV antibiotics regimen, he will follow as an outpatient regarding stent removal  Staphylococcus epidermidis bacteremia -Continue with IV vancomycin, ID input greatly appreciated, TEE with no evidence of endocarditis, will need total of 2 weeks treatment of IV vancomycin with stop date 03/07/2019, surveillance blood culture has been negative, so we will place  PICC line. - ID input greatly appreciated   Essential hypertension -continue with Cardizem 180 mg daily, better controlled after starting hydrochlorothiazide continue with PRN hydralazine   History of COPD, stable -Continue home bronchodilators or its equivalent here.  Currently stable  Diabetes mellitus type 2, insulin-dependent Peripheral neuropathy secondary diabetes mellitus type 2 -Continue Lantus 33 units daily, insulin sliding scale and Accu-Chek. -Continue gabapentin  Tobacco use -Nicotine patch if necessary  History of depression and substance abuse -Resume home medications  History of sick sinus syndrome with pacemaker in place - On Cardizem.  Continue to monitor.  History of seizure -Continue home medications including Keppra  History of nonhemorrhagic CVA with mild left-sided residual defect, nonhemorrhagic -Continue aspirin and statin  BPH -On Flomax and finasteride  Reports some constipation, will start on stool softeners  DVT prophylaxis: SCDs,subcu Lovenox  Code Status: Full code Family Communication: None at bedside Disposition Plan: Mains on IV antibiotics with need of further work-up  Consultants:   Urology  ID  Procedures:   PICC line insertion 02/27/2019  Antimicrobials:   Aztreonam stopped 3/10, Flagyl stopped 3/10  Vancomycin 3/9 >  Cefepime 3/10>3/11   Objective: Vitals:   02/26/19 2300 02/27/19 0422  02/27/19 1208  02/27/19 1313  BP: (!) 158/88 (!) 142/83  138/70  Pulse:  61  64  Resp:  16  18  Temp:  98.8 F (37.1 C)  97.8 F (36.6 C)  TempSrc:  Oral  Oral  SpO2:  94% 95% 95%  Weight:      Height:        Intake/Output Summary (Last 24 hours) at 02/27/2019 1329 Last data filed at 02/27/2019 1313 Gross per 24 hour  Intake 2214.29 ml  Output 826 ml  Net 1388.29 ml   Filed Weights   02/21/19 0615  Weight: 97.5 kg    Examination:  Awake Alert, Oriented X 3, No new F.N deficits, Normal affect Symmetrical Chest wall movement, Good air movement bilaterally, CTAB RRR,No Gallops,Rubs or new Murmurs, No Parasternal Heave +ve B.Sounds, Abd Soft, No tenderness, No rebound - guarding or rigidity. No Cyanosis, Clubbing or edema, No new Rash or bruise         Data Reviewed:   CBC: Recent Labs  Lab 02/21/19 0626  02/23/19 0558 02/24/19 0555 02/25/19 0145 02/26/19 0538 02/27/19 0519  WBC 11.6*   < > 8.6 7.0 6.7 7.5 8.2  NEUTROABS 8.3*  --   --   --   --   --   --   HGB 14.0   < > 10.9* 11.1* 11.1* 11.9* 12.1*  HCT 42.8   < > 35.5* 36.1* 36.9* 38.2* 39.5  MCV 86.1   < > 87.2 86.2 86.6 84.7 86.2  PLT 205   < > 169 193 178 234 274   < > = values in this interval not displayed.   Basic Metabolic Panel: Recent Labs  Lab 02/23/19 0558 02/24/19 0555 02/25/19 0145 02/26/19 0538 02/27/19 0519  NA 138 140 139 136 138  K 3.7 3.6 3.6 3.6 4.0  CL 107 109 105 103 104  CO2 25 25 24 24 24   GLUCOSE 200* 154* 191* 182* 201*  BUN 15 13 13 14 14   CREATININE 0.86 0.75 0.77 0.82 0.91  CALCIUM 8.3* 8.6* 8.8* 8.9 9.2  MG 1.9 1.9 1.9 2.0 2.1   GFR: Estimated Creatinine Clearance: 97.9 mL/min (by C-G formula based on SCr of 0.91 mg/dL). Liver Function Tests: Recent Labs  Lab 02/21/19 0626  AST 10*  ALT 25  ALKPHOS 137*  BILITOT 1.0  PROT 7.6  ALBUMIN 4.0   Recent Labs  Lab 02/21/19 0626  LIPASE 27   No results for input(s): AMMONIA in the last 168 hours. Coagulation  Profile: Recent Labs  Lab 02/21/19 0626  INR 1.0   Cardiac Enzymes: No results for input(s): CKTOTAL, CKMB, CKMBINDEX, TROPONINI in the last 168 hours. BNP (last 3 results) No results for input(s): PROBNP in the last 8760 hours. HbA1C: No results for input(s): HGBA1C in the last 72 hours. CBG: Recent Labs  Lab 02/26/19 1148 02/26/19 1659 02/26/19 2137 02/27/19 0739 02/27/19 1135  GLUCAP 219* 200* 296* 164* 173*   Lipid Profile: No results for input(s): CHOL, HDL, LDLCALC, TRIG, CHOLHDL, LDLDIRECT in the last 72 hours. Thyroid Function Tests: No results for input(s): TSH, T4TOTAL, FREET4, T3FREE, THYROIDAB in the last 72 hours. Anemia Panel: No results for input(s): VITAMINB12, FOLATE, FERRITIN, TIBC, IRON, RETICCTPCT in the last 72 hours. Sepsis Labs: Recent Labs  Lab 02/21/19 0626 02/21/19 0800  LATICACIDVEN 1.1 1.0    Recent Results (from the past 240 hour(s))  Culture, blood (Routine x 2)     Status: Abnormal   Collection  Time: 02/21/19  6:26 AM  Result Value Ref Range Status   Specimen Description   Final    BLOOD RIGHT HAND Performed at [facility] [address], [place], [place] [address]    Special Requests   Final    BOTTLES DRAWN AEROBIC AND ANAEROBIC Blood Culture results may not be optimal due to an excessive volume of blood received in culture bottles Performed at [facility] [address], [place], [place] [address]    Culture  Setup Time   Final    GRAM POSITIVE COCCI CRITICAL RESULT CALLED TO, READ BACK BY AND VERIFIED WITH: Elenore Paddy PharmD 16:15 02/22/19 ([email or web address]) AEROBIC BOTTLE ONLY Performed at [facility], [place] [address] [place], [place] [address]    Culture STAPHYLOCOCCUS EPIDERMIDIS (A)  Final   Report Status 02/24/2019 FINAL  Final   Organism ID, Bacteria STAPHYLOCOCCUS EPIDERMIDIS  Final      Susceptibility   Staphylococcus epidermidis - MIC*    CIPROFLOXACIN >=8 RESISTANT  Resistant     ERYTHROMYCIN >=8 RESISTANT Resistant     GENTAMICIN >=16 RESISTANT Resistant     OXACILLIN >=4 RESISTANT Resistant     TETRACYCLINE 2 SENSITIVE Sensitive     VANCOMYCIN 1 SENSITIVE Sensitive     TRIMETH/SULFA 160 RESISTANT Resistant     CLINDAMYCIN >=8 RESISTANT Resistant     RIFAMPIN <=0.5 SENSITIVE Sensitive     Inducible Clindamycin NEGATIVE Sensitive     * STAPHYLOCOCCUS EPIDERMIDIS  Culture, blood (Routine x 2)     Status: Abnormal   Collection Time: 02/21/19  6:26 AM  Result Value Ref Range Status   Specimen Description   Final    BLOOD LEFT FOREARM Performed at [facility], [address], [place], [place] [address]    Special Requests   Final    BOTTLES DRAWN AEROBIC AND ANAEROBIC Blood Culture results may not be optimal due to an excessive volume of blood received in culture bottles Performed at [facility] [address] [place], [place] [address]    Culture  Setup Time   Final    GRAM POSITIVE COCCI CRITICAL VALUE NOTED.  VALUE IS CONSISTENT WITH PREVIOUSLY REPORTED AND CALLED VALUE. AEROBIC BOTTLE ONLY    Culture (A)  Final    STAPHYLOCOCCUS EPIDERMIDIS SUSCEPTIBILITIES PERFORMED ON PREVIOUS CULTURE WITHIN THE LAST 5 DAYS. Performed at [facility], [place] [address], [place], [place] [address]    Report Status 02/24/2019 FINAL  Final  Urine culture     Status: Abnormal   Collection Time: 02/21/19  6:26 AM  Result Value Ref Range Status   Specimen Description   Final    URINE, CLEAN CATCH Performed at [facility], [place] [address] [place], [place] [address]    Special Requests   Final    NONE Performed at [facility], [place] [address] [place],  [address]    Culture >=100,000 COLONIES/mL STAPHYLOCOCCUS EPIDERMIDIS (A)  Final   Report Status 02/23/2019 FINAL  Final   Organism ID, Bacteria STAPHYLOCOCCUS EPIDERMIDIS (A)  Final      Susceptibility   Staphylococcus  epidermidis - MIC*    CIPROFLOXACIN >=8 RESISTANT Resistant     GENTAMICIN >=16 RESISTANT Resistant     NITROFURANTOIN <=16 SENSITIVE Sensitive     OXACILLIN >=4 RESISTANT Resistant     TETRACYCLINE 2 SENSITIVE Sensitive     VANCOMYCIN 1 SENSITIVE Sensitive     TRIMETH/SULFA  80 RESISTANT Resistant     CLINDAMYCIN >=8 RESISTANT Resistant     RIFAMPIN <=0.5 SENSITIVE Sensitive     Inducible Clindamycin NEGATIVE Sensitive     * >=100,000 COLONIES/mL STAPHYLOCOCCUS EPIDERMIDIS  Blood Culture ID Panel (Reflexed)     Status: Abnormal   Collection Time: 02/21/19  6:26 AM  Result Value Ref Range Status   Enterococcus species NOT DETECTED NOT DETECTED Final   Listeria monocytogenes NOT DETECTED NOT DETECTED Final   Staphylococcus species DETECTED (A) NOT DETECTED Final    Comment: Methicillin (oxacillin) resistant coagulase negative staphylococcus. Possible blood culture contaminant (unless isolated from more than one blood culture draw or clinical case suggests pathogenicity). No antibiotic treatment is indicated for blood  culture contaminants. CRITICAL RESULT CALLED TO, READ BACK BY AND VERIFIED WITH: Elenore Paddy PharmD 16:15 02/22/19 ([email or web address])    Staphylococcus aureus (BCID) NOT DETECTED NOT DETECTED Final   Methicillin resistance DETECTED (A) NOT DETECTED Final    Comment: CRITICAL RESULT CALLED TO, READ BACK BY AND VERIFIED WITH: Elenore Paddy PharmD 16:15 02/22/19 ([email or web address])    Streptococcus species NOT DETECTED NOT DETECTED Final   Streptococcus agalactiae NOT DETECTED NOT DETECTED Final   Streptococcus pneumoniae NOT DETECTED NOT DETECTED Final   Streptococcus pyogenes NOT DETECTED NOT DETECTED Final   Acinetobacter baumannii NOT DETECTED NOT DETECTED Final   Enterobacteriaceae species NOT DETECTED NOT DETECTED Final   Enterobacter cloacae complex NOT DETECTED NOT DETECTED Final   Escherichia coli NOT DETECTED NOT DETECTED Final   Klebsiella oxytoca NOT DETECTED NOT DETECTED Final    Klebsiella pneumoniae NOT DETECTED NOT DETECTED Final   Proteus species NOT DETECTED NOT DETECTED Final   Serratia marcescens NOT DETECTED NOT DETECTED Final   Haemophilus influenzae NOT DETECTED NOT DETECTED Final   Neisseria meningitidis NOT DETECTED NOT DETECTED Final   Pseudomonas aeruginosa NOT DETECTED NOT DETECTED Final   Candida albicans NOT DETECTED NOT DETECTED Final   Candida glabrata NOT DETECTED NOT DETECTED Final   Candida krusei NOT DETECTED NOT DETECTED Final   Candida parapsilosis NOT DETECTED NOT DETECTED Final   Candida tropicalis NOT DETECTED NOT DETECTED Final    Comment: Performed at [facility], [address], [place], [place] [address]  Culture, blood (Routine X 2) w Reflex to ID Panel     Status: None (Preliminary result)   Collection Time: 02/23/19 11:43 AM  Result Value Ref Range Status   Specimen Description   Final    BLOOD RIGHT ANTECUBITAL Performed at [facility] [address] [place] [address] [place], [place] [address]    Special Requests   Final    BOTTLES DRAWN AEROBIC AND ANAEROBIC Blood Culture adequate volume Performed at [facility] [address], [place], [place] [address]    Culture   Final    NO GROWTH 3 DAYS Performed at [facility], [place] [address] [place], [place] [address]    Report Status PENDING  Incomplete  Culture, blood (Routine X 2) w Reflex to ID Panel     Status: None (Preliminary result)   Collection Time: 02/23/19 11:45 AM  Result Value Ref Range Status   Specimen Description   Final    BLOOD RIGHT ARM Performed at [facility], [place] [address] [place], [place] [address]    Special Requests   Final    BOTTLES DRAWN AEROBIC AND ANAEROBIC Blood Culture adequate volume Performed at [facility] [address] [place], [place] [address]  Culture   Final    NO GROWTH 3 DAYS Performed at [facility], [place] [address], [place], [place] [address]     Report Status PENDING  Incomplete         Radiology Studies: [place] Ekg Site Rite  Result Date: 02/26/2019 If Site Rite image not attached, placement could not be confirmed due to current cardiac rhythm.       Scheduled Meds:  albuterol  2.5 mg Nebulization BID   arformoterol  15 mcg Nebulization BID   And   umeclidinium bromide  1 puff Inhalation Daily   aspirin  325 mg Oral Daily   atorvastatin  40 mg Oral QHS   brimonidine  1 drop Both Eyes QHS   budesonide  0.25 mg Nebulization BID   cholecalciferol  2,000 Units Oral Daily   diltiazem  180 mg Oral Daily   enoxaparin (LOVENOX) injection  40 mg Subcutaneous Q24H   feeding supplement (ENSURE ENLIVE)  237 mL Oral Q24H   finasteride  5 mg Oral Daily   gabapentin  300 mg Oral TID   hydrALAZINE  25 mg Oral Q8H   hydrochlorothiazide  25 mg Oral Daily   insulin aspart  0-20 Units Subcutaneous TID WC   insulin aspart  0-5 Units Subcutaneous QHS   insulin glargine  33 Units Subcutaneous QHS   latanoprost  1 drop Both Eyes QHS   levETIRAcetam  750 mg Oral BID   loratadine  10 mg Oral Daily   lurasidone  20 mg Oral QHS   mirabegron ER  25 mg Oral Daily   mirtazapine  7.5 mg Oral QHS   nicotine  14 mg Transdermal Daily   pantoprazole  40 mg Oral BID AC   polyethylene glycol  17 g Oral Daily   polyvinyl alcohol  1 drop Both Eyes QHS   senna-docusate  3 tablet Oral BID   sodium chloride flush  10-40 mL Intracatheter Q12H   tamsulosin  0.4 mg Oral QPC supper   venlafaxine XR  150 mg Oral Q breakfast   Continuous Infusions:  sodium chloride 50 mL/hr at 02/27/19 0418   vancomycin 1,500 mg (02/27/19 0919)     LOS: 6 days      Phillips Climes, MD [facility]  If 7PM-7AM, please contact night-coverage [email or web address] 02/27/2019, 1:29 PM

## 2019-02-27 NOTE — Progress Notes (Signed)
Peripherally Inserted Central Catheter/Midline Placement  The IV Nurse has discussed with the patient and/or persons authorized to consent for the patient, the purpose of this procedure and the potential benefits and risks involved with this procedure.  The benefits include less needle sticks, lab draws from the catheter, and the patient may be discharged home with the catheter. Risks include, but not limited to, infection, bleeding, blood clot (thrombus formation), and puncture of an artery; nerve damage and irregular heartbeat and possibility to perform a PICC exchange if needed/ordered by physician.  Alternatives to this procedure were also discussed.  Bard Power PICC patient education guide, fact sheet on infection prevention and patient information card has been provided to patient /or left at bedside.    PICC/Midline Placement Documentation  PICC Single Lumen 02/27/19 PICC Right Cephalic 41 cm 0 cm (Active)  Indication for Insertion or Continuance of Line Home intravenous therapies (PICC only) 02/27/2019  9:06 AM  Exposed Catheter (cm) 0 cm 02/27/2019  9:06 AM  Site Assessment Clean;Dry;Intact 02/27/2019  9:06 AM  Line Status Flushed;Saline locked;Blood return noted 02/27/2019  9:06 AM  Dressing Type Transparent 02/27/2019  9:06 AM  Dressing Status Clean;Dry;Intact;Antimicrobial disc in place 02/27/2019  9:06 AM  Line Care Connections checked and tightened 02/27/2019  9:06 AM  Line Adjustment (NICU/IV Team Only) No 02/27/2019  9:06 AM  Dressing Intervention New dressing 02/27/2019  9:06 AM  Dressing Change Due 03/06/19 02/27/2019  9:06 AM       Rolena Infante 02/27/2019, 9:06 AM

## 2019-02-27 NOTE — Progress Notes (Signed)
PT undergoing sterile procedure not available for neb tx at this time- RN aware.

## 2019-02-28 LAB — CULTURE, BLOOD (ROUTINE X 2)
CULTURE: NO GROWTH
Culture: NO GROWTH
Special Requests: ADEQUATE
Special Requests: ADEQUATE

## 2019-02-28 LAB — GLUCOSE, CAPILLARY
GLUCOSE-CAPILLARY: 104 mg/dL — AB (ref 70–99)
GLUCOSE-CAPILLARY: 251 mg/dL — AB (ref 70–99)
Glucose-Capillary: 94 mg/dL (ref 70–99)
Glucose-Capillary: 209 mg/dL — ABNORMAL HIGH (ref 70–99)
Glucose-Capillary: 187 mg/dL — ABNORMAL HIGH (ref 70–99)

## 2019-02-28 MED ORDER — INSULIN GLARGINE 100 UNIT/ML ~~LOC~~ SOLN: 33.0000 [IU] | Freq: Every day | SUBCUTANEOUS | Status: DC

## 2019-02-28 MED ORDER — HYDRALAZINE HCL 25 MG PO TABS: 25.0000 mg | ORAL_TABLET | Freq: Three times a day (TID) | ORAL | 0 refills | Status: DC

## 2019-02-28 MED ORDER — HEPARIN SOD (PORK) LOCK FLUSH 100 UNIT/ML IV SOLN
250.0000 [IU] | INTRAVENOUS | Status: AC | PRN
  Administered 2019-02-28: 250 [IU]

## 2019-02-28 MED ORDER — HYDROCHLOROTHIAZIDE 25 MG PO TABS: 25.0000 mg | ORAL_TABLET | Freq: Every day | ORAL | 0 refills | Status: DC

## 2019-02-28 NOTE — Discharge Instructions (Signed)
Follow with Primary MD [facility] in 7 days   Get CBC, CMP, checked  by Primary MD next visit.    Activity: As tolerated with Full fall precautions use walker/cane & assistance as needed   Disposition Home    Diet: Heart Healthy     On your next visit with your primary care physician please Get Medicines reviewed and adjusted.   Please request your Prim.MD to go over all Hospital Tests and Procedure/Radiological results at the follow up, please get all Hospital records sent to your Prim MD by signing hospital release before you go home.   If you experience worsening of your admission symptoms, develop shortness of breath, life threatening emergency, suicidal or homicidal thoughts you must seek medical attention immediately by calling 911 or calling your MD immediately  if symptoms less severe.  You Must read complete instructions/literature along with all the possible adverse reactions/side effects for all the Medicines you take and that have been prescribed to you. Take any new Medicines after you have completely understood and accpet all the possible adverse reactions/side effects.   Do not drive, operating heavy machinery, perform activities at heights, swimming or participation in water activities or provide baby sitting services if your were admitted for syncope or siezures until you have seen by Primary MD or a Neurologist and advised to do so again.  Do not drive when taking Pain medications.    Do not take more than prescribed Pain, Sleep and Anxiety Medications  Special Instructions: If you have smoked or chewed Tobacco  in the last 2 yrs please stop smoking, stop any regular Alcohol  and or any Recreational drug use.  Wear Seat belts while driving.   Please note  You were cared for by a hospitalist during your hospital stay. If you have any questions about your discharge medications or the care you received while you were in the hospital after you are  discharged, you can call the unit and asked to speak with the hospitalist on call if the hospitalist that took care of you is not available. Once you are discharged, your primary care physician will handle any further medical issues. Please note that NO REFILLS for any discharge medications will be authorized once you are discharged, as it is imperative that you return to your primary care physician (or establish a relationship with a primary care physician if you do not have one) for your aftercare needs so that they can reassess your need for medications and monitor your lab values.

## 2019-02-28 NOTE — TOC Progression Note (Addendum)
Transition of Care [facility]) - Progression Note    Patient Details  Name: Wesley [name] MRN: [id number] Date of Birth: 1956/11/06  [facility]) CM/SW Contact  Leeroy Cha, RN Phone Number: 02/28/2019, 10:13 AM  Clinical Narrative:    tct-Luara Flowers-[facility]/my name and number given/[facility] does not have auth. At this time.tct-April with the [place]/will fax information needed to approve home iv infusion/[facility] is [facility] Vonna Drafts and CSW is Kellie Simmering at [phone] ext [phone]/ will await fax and send needed information. Fax with information requested at [phone]. Iv abx is authorizied through [place] and the [facility] will start with the am tomorrow at 10 am,pt states that he prefers to stay for the 10pm dose tonight and then go home. Mickel Baas with [facility] notified.   Expected Discharge Plan: Home/Self Care Barriers to Discharge: Insurance Authorization  Expected Discharge Plan and Services Expected Discharge Plan: Home/Self Care Discharge Planning Services: CM Consult     Expected Discharge Date: (unknown)                         Social Determinants of Health (SDOH) Interventions    Readmission Risk Interventions 30 Day Unplanned Readmission Risk Score     ED to Hosp-Admission (Current) from 02/21/2019 in [place]  30 Day Unplanned Readmission Risk Score (%)  33 Filed at 02/28/2019 0801     This score is the patient's risk of an unplanned readmission within 30 days of being discharged (0 -100%). The score is based on dignosis, age, lab data, medications, orders, and past utilization.   Low:  0-14.9   Medium: 15-21.9   High: 22-29.9   Extreme: 30 and above       Readmission Risk Prevention Plan 02/23/2019  Transportation Screening Complete  Medication Review Press photographer) Complete  PCP or Specialist appointment within 3-5 days of discharge Complete  HRI or [place] Complete  SW Recovery Care/Counseling  Consult Complete  [place] Not Applicable  Some recent data might be hidden

## 2019-02-28 NOTE — Progress Notes (Signed)
Pharmacy Antibiotic Note  Wesley [name] is a 63 y.o. male admitted on 02/21/2019 with pyelonephritis vs post L ureteral stent placement infection.  Pharmacy has been consulted for Vancomycin dosing.  Today 02/25/19:  Day 7/14 vanc  No fevers since 3/10  WBC WNL  SCr rising slightly but still near baseline  Plan:  Continue vancomycin 1500 mg IV q12 hr (calculated AUC 546 but likely overestimate d/t compressed dose interval prior to levels being drawn; est Vt 13.1)  SCr q48 hr  OPAT completed  Pharmacy will follow peripherally, leaving notes only as needed for dose changes or levels   Height: 6\' 2"  (188 cm) Weight: 215 lb (97.5 kg) IBW/kg (Calculated) : 82.2  Temp (24hrs), Avg:98.4 F (36.9 C), Min:97.8 F (36.6 C), Max:98.7 F (37.1 C)  Recent Labs  Lab 02/23/19 0558 02/24/19 0555 02/25/19 0145 02/25/19 0901 02/26/19 0538 02/27/19 0519  WBC 8.6 7.0 6.7  --  7.5 8.2  CREATININE 0.86 0.75 0.77  --  0.82 0.91  VANCOTROUGH  --   --   --  18  --   --   VANCOPEAK  --   --  36  --   --   --     Estimated Creatinine Clearance: 97.9 mL/min (by C-G formula based on SCr of 0.91 mg/dL).    Allergies  Allergen Reactions  . Penicillins Swelling    "General swelling" Tolerated cefepime & ceftriaxone previously. Has patient had a PCN reaction causing immediate rash, facial/tongue/throat swelling, SOB or lightheadedness with hypotension: Yes Has patient had a PCN reaction causing severe rash involving mucus membranes or skin necrosis: Unk Has patient had a PCN reaction that required hospitalization: Yes Has patient had a PCN reaction occurring within the last 10 years: No If all of the above answers are "NO", then may proceed with Cephalosporin  . Percocet [Oxycodone-Acetaminophen]     Itching  . Levaquin [Levofloxacin In D5w] Hives    Antimicrobials this admission: Vanc/Aztreonam/Flagyl x 1 on 3/9 Cefepime 3/9 >> 3/11 Vanc 3/10 >>  Microbiology results: 3/11 BCx:  NG x 1 day 3/9 BCx: 2/2 (2/4 bottles) MRSE 3/9 UCx: >100K MRSE  Thank you for allowing pharmacy to be a part of this patient's care.  Reuel Boom, PharmD, BCPS [phone] 02/28/2019, 12:46 PM

## 2019-02-28 NOTE — TOC Transition Note (Signed)
Transition of Care [facility]) - CM/SW Discharge Note   Patient Details  Name: CORTAVIOUS NIX MRN: [id number] Date of Birth: Jun 22, 1956  Transition [facility]) CM/SW Contact:  Leeroy Cha, RN Phone Number: 02/28/2019, 4:07 PM   Clinical Narrative:    To go home this pm after the 10 pm dose of vaNCOMYCIN/cORAM WILL COME AND DO THE NEXT DOSE IN THE HOME AT 1000 ON [id number].   Final next level of care: [place] Barriers to Discharge: No Barriers Identified   Patient Goals and CMS Choice Patient states their goals for this hospitalization and ongoing recovery are:: i just want to go home CMS Medicare.gov Compare Post Acute Care list provided to:: Other (Comment Required) Choice offered to / list presented to : NA  Discharge Placement                       Discharge Plan and Services Discharge Planning Services: CM Consult Post Acute Care Choice: Durable Medical Equipment, Home Health              HH Arranged: RN, IV Antibiotics HH Agency: Other - See comment   Social Determinants of Health (SDOH) Interventions     Readmission Risk Interventions Readmission Risk Prevention Plan 02/23/2019  Transportation Screening Complete  Medication Review Press photographer) Complete  PCP or Specialist appointment within 3-5 days of discharge Complete  HRI or [place] Complete  SW Recovery Care/Counseling Consult Complete  [place] Not Applicable  Some recent data might be hidden

## 2019-02-28 NOTE — Discharge Summary (Signed)
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 collection extending from 4:00 to 10:00 within the subcapsular space along the posterior and medial aspect of the right kidney extending from the upper pole through the interpolar region. The collection is low to intermediate in attenuation and measures 4.3 x 8.9 x 1.9 cm (volume = 38 cm^3). Mildly striated nephrogram in the posterior aspect of the left upper pole. A left-sided ureteral stent is present. The proximal loop is appropriately reconstituted and within an interpolar calyx. The distal end is just within the bladder. No evidence of hydronephrosis. Mild periureteric  stranding. Query some mild eccentric bladder wall thickening at the left UVJ. Stomach/Bowel: There are a few small sigmoid colonic diverticula but no evidence of active inflammation. No focal bowel wall thickening or evidence of obstruction. Vascular/Lymphatic: Calcifications visualized throughout the abdominal aorta. The infrarenal abdominal aorta is mildly aneurysmal at 3.1 cm. No suspicious lymphadenopathy. Reproductive: Prostate is unremarkable. Other: No abdominal wall hernia or abnormality. No abdominopelvic ascites. Musculoskeletal: No acute fracture or aggressive appearing lytic or blastic osseous lesion. Lower lumbar degenerative disc disease. IMPRESSION: 1. Small to moderate intermediate density left subcapsular fluid collection with an approximate volume of 38 mL. Additionally, there is striation and hypoenhancement of the renal parenchyma on the delayed phase imaging in the posterior aspect of the left upper pole. This combination of findings likely represents postoperative edema and a small subacute perinephric hemorrhage. However, given the clinical history of fever, pyelonephritis or infection of the subcapsular fluid are difficult to exclude entirely by imaging alone. If there is clinical concern for infection of the subcapsular fluid, percutaneous aspiration could be considered. 2. Left ureteral stent without evidence of hydronephrosis. 3. Mild left periureteral stranding and slight eccentric bladder wall thickening in the region of the left UVJ. Findings likely represent reactive changes/inflammation related to the recently placed ureteral stent. 4. 3.1 cm infrarenal abdominal aortic aneurysm. Recommend followup by ultrasound in 3 years. This recommendation follows ACR consensus guidelines: White Paper of the ACR Incidental Findings Committee II on Vascular Findings. J Am Coll Radiol 2013; 10:789-794 Aortic aneurysm NOS (ICD10-I71.9); Aortic Atherosclerosis (ICD10-170.0) 5. Coronary artery  calcifications. 6. Mild sigmoid colonic diverticulosis without evidence of active diverticulitis. 7. Hepatic steatosis. Electronically Signed   By: Jacqulynn Cadet M.D.   On: 02/21/2019 09:13   Dg C-arm 1-60 Min-no Report  Result Date: 02/18/2019 Fluoroscopy was utilized by the requesting physician.  No radiographic interpretation.   Dg C-arm 1-60 Min-no Report  Result Date: 02/03/2019 Fluoroscopy was utilized by the requesting physician.  No radiographic interpretation.   [place] Ekg Site Rite  Result Date: 02/26/2019 If Site Rite image not attached, placement could not be confirmed due to current cardiac rhythm.   Micro Results    Recent Results (from the past 240 hour(s))  Culture, blood (Routine x 2)     Status: Abnormal   Collection Time: 02/21/19  6:26 AM  Result Value Ref Range Status   Specimen Description   Final  BLOOD RIGHT HAND Performed at [facility], [place] [address], [place], [place] [address]    Special Requests   Final    BOTTLES DRAWN AEROBIC AND ANAEROBIC Blood Culture results may not be optimal due to an excessive volume of blood received in culture bottles Performed at [place] [address], [place], [place] [address]    Culture  Setup Time   Final    GRAM POSITIVE COCCI CRITICAL RESULT CALLED TO, READ BACK BY AND VERIFIED WITH: Elenore Paddy PharmD 16:15 02/22/19 ([email or web address]) AEROBIC BOTTLE ONLY Performed at [facility], [place] [address], [place], [place] [address]    Culture STAPHYLOCOCCUS EPIDERMIDIS (A)  Final   Report Status 02/24/2019 FINAL  Final   Organism ID, Bacteria STAPHYLOCOCCUS EPIDERMIDIS  Final      Susceptibility   Staphylococcus epidermidis - MIC*    CIPROFLOXACIN >=8 RESISTANT Resistant     ERYTHROMYCIN >=8 RESISTANT Resistant     GENTAMICIN >=16 RESISTANT Resistant     OXACILLIN >=4 RESISTANT Resistant     TETRACYCLINE 2 SENSITIVE Sensitive     VANCOMYCIN 1 SENSITIVE Sensitive      TRIMETH/SULFA 160 RESISTANT Resistant     CLINDAMYCIN >=8 RESISTANT Resistant     RIFAMPIN <=0.5 SENSITIVE Sensitive     Inducible Clindamycin NEGATIVE Sensitive     * STAPHYLOCOCCUS EPIDERMIDIS  Culture, blood (Routine x 2)     Status: Abnormal   Collection Time: 02/21/19  6:26 AM  Result Value Ref Range Status   Specimen Description   Final    BLOOD LEFT FOREARM Performed at [facility], [address] [place], [place] [address]    Special Requests   Final    BOTTLES DRAWN AEROBIC AND ANAEROBIC Blood Culture results may not be optimal due to an excessive volume of blood received in culture bottles Performed at [facility] [address], [place], [place] [address]    Culture  Setup Time   Final    GRAM POSITIVE COCCI CRITICAL VALUE NOTED.  VALUE IS CONSISTENT WITH PREVIOUSLY REPORTED AND CALLED VALUE. AEROBIC BOTTLE ONLY    Culture (A)  Final    STAPHYLOCOCCUS EPIDERMIDIS SUSCEPTIBILITIES PERFORMED ON PREVIOUS CULTURE WITHIN THE LAST 5 DAYS. Performed at [facility], [place] [address] [place], [place] [address]    Report Status 02/24/2019 FINAL  Final  Urine culture     Status: Abnormal   Collection Time: 02/21/19  6:26 AM  Result Value Ref Range Status   Specimen Description   Final    URINE, CLEAN CATCH Performed at [facility], [place] [address] [place], [place] [address]    Special Requests   Final    NONE Performed at [facility], [place] [address] [place], [place] [address]    Culture >=100,000 COLONIES/mL STAPHYLOCOCCUS EPIDERMIDIS (A)  Final   Report Status 02/23/2019 FINAL  Final   Organism ID, Bacteria STAPHYLOCOCCUS EPIDERMIDIS (A)  Final      Susceptibility   Staphylococcus epidermidis - MIC*    CIPROFLOXACIN >=8 RESISTANT Resistant     GENTAMICIN >=16 RESISTANT Resistant     NITROFURANTOIN <=16 SENSITIVE Sensitive     OXACILLIN >=4 RESISTANT Resistant     TETRACYCLINE 2 SENSITIVE  Sensitive     VANCOMYCIN 1 SENSITIVE Sensitive     TRIMETH/SULFA 80 RESISTANT Resistant     CLINDAMYCIN >=8 RESISTANT Resistant     RIFAMPIN <=0.5 SENSITIVE Sensitive  Inducible Clindamycin NEGATIVE Sensitive     * >=100,000 COLONIES/mL STAPHYLOCOCCUS EPIDERMIDIS  Blood Culture ID Panel (Reflexed)     Status: Abnormal   Collection Time: 02/21/19  6:26 AM  Result Value Ref Range Status   Enterococcus species NOT DETECTED NOT DETECTED Final   Listeria monocytogenes NOT DETECTED NOT DETECTED Final   Staphylococcus species DETECTED (A) NOT DETECTED Final    Comment: Methicillin (oxacillin) resistant coagulase negative staphylococcus. Possible blood culture contaminant (unless isolated from more than one blood culture draw or clinical case suggests pathogenicity). No antibiotic treatment is indicated for blood  culture contaminants. CRITICAL RESULT CALLED TO, READ BACK BY AND VERIFIED WITH: Elenore Paddy PharmD 16:15 02/22/19 ([email or web address])    Staphylococcus aureus (BCID) NOT DETECTED NOT DETECTED Final   Methicillin resistance DETECTED (A) NOT DETECTED Final    Comment: CRITICAL RESULT CALLED TO, READ BACK BY AND VERIFIED WITH: Elenore Paddy PharmD 16:15 02/22/19 ([email or web address])    Streptococcus species NOT DETECTED NOT DETECTED Final   Streptococcus agalactiae NOT DETECTED NOT DETECTED Final   Streptococcus pneumoniae NOT DETECTED NOT DETECTED Final   Streptococcus pyogenes NOT DETECTED NOT DETECTED Final   Acinetobacter baumannii NOT DETECTED NOT DETECTED Final   Enterobacteriaceae species NOT DETECTED NOT DETECTED Final   Enterobacter cloacae complex NOT DETECTED NOT DETECTED Final   Escherichia coli NOT DETECTED NOT DETECTED Final   Klebsiella oxytoca NOT DETECTED NOT DETECTED Final   Klebsiella pneumoniae NOT DETECTED NOT DETECTED Final   Proteus species NOT DETECTED NOT DETECTED Final   Serratia marcescens NOT DETECTED NOT DETECTED Final   Haemophilus influenzae NOT DETECTED NOT DETECTED  Final   Neisseria meningitidis NOT DETECTED NOT DETECTED Final   Pseudomonas aeruginosa NOT DETECTED NOT DETECTED Final   Candida albicans NOT DETECTED NOT DETECTED Final   Candida glabrata NOT DETECTED NOT DETECTED Final   Candida krusei NOT DETECTED NOT DETECTED Final   Candida parapsilosis NOT DETECTED NOT DETECTED Final   Candida tropicalis NOT DETECTED NOT DETECTED Final    Comment: Performed at [facility], [address], [place], [place] [address]  Culture, blood (Routine X 2) w Reflex to ID Panel     Status: None   Collection Time: 02/23/19 11:43 AM  Result Value Ref Range Status   Specimen Description   Final    BLOOD RIGHT ANTECUBITAL Performed at [place] [address] [place], [place] [address]    Special Requests   Final    BOTTLES DRAWN AEROBIC AND ANAEROBIC Blood Culture adequate volume Performed at [facility] [facility] [address], [place], [place] [address]    Culture   Final    NO GROWTH 5 DAYS Performed at [facility], [place] [address], [place], [place] [address]    Report Status 02/28/2019 FINAL  Final  Culture, blood (Routine X 2) w Reflex to ID Panel     Status: None   Collection Time: 02/23/19 11:45 AM  Result Value Ref Range Status   Specimen Description   Final    BLOOD RIGHT ARM Performed at [facility], [place] [address] [place], [place] [address]    Special Requests   Final    BOTTLES DRAWN AEROBIC AND ANAEROBIC Blood Culture adequate volume Performed at [facility] [address], [place],  [address]    Culture   Final    NO GROWTH 5 DAYS Performed at [facility], [place] [address], [place], [place]  [address]    Report Status 02/28/2019 FINAL  Final       Today   Subjective:   Wesley [name] today has no headache,no chest abdominal pain,no new weakness tingling or numbness, feels much better wants to go home today.  Objective:   Blood pressure  (!) 151/88, pulse (!) 59, temperature 98.7 F (37.1 C), temperature source Oral, resp. rate 18, height '6\' 2"'  (1.88 m), weight 97.5 kg, SpO2 98 %.   Intake/Output Summary (Last 24 hours) at 02/28/2019 1208 Last data filed at 02/28/2019 0700 Gross per 24 hour  Intake 1040 ml  Output 2925 ml  Net -1885 ml    Exam Awake Alert, Oriented x 3, No new F.N deficits, Normal affect Symmetrical Chest wall movement, Good air movement bilaterally, CTAB RRR,No Gallops,Rubs or new Murmurs, No Parasternal Heave +ve B.Sounds, Abd Soft, Non tender, No organomegaly appriciated, No rebound -guarding or rigidity. No Cyanosis, Clubbing or edema, No new Rash or bruise  Data Review   CBC w Diff:  Lab Results  Component Value Date   WBC 8.2 02/27/2019   HGB 12.1 (L) 02/27/2019   HCT 39.5 02/27/2019   HCT TEST REQUEST RECEIVED WITHOUT APPROPRIATE SPECIMEN 05/20/2017   PLT 274 02/27/2019   LYMPHOPCT 17 02/21/2019   MONOPCT 10 02/21/2019   EOSPCT 0 02/21/2019   BASOPCT 0 02/21/2019    CMP:  Lab Results  Component Value Date   NA 138 02/27/2019   K 4.0 02/27/2019   CL 104 02/27/2019   CO2 24 02/27/2019   BUN 14 02/27/2019   CREATININE 0.91 02/27/2019   CREATININE 0.85 01/30/2017   PROT 7.6 02/21/2019   ALBUMIN 4.0 02/21/2019   BILITOT 1.0 02/21/2019   ALKPHOS 137 (H) 02/21/2019   AST 10 (L) 02/21/2019   ALT 25 02/21/2019  .   Total Time in preparing paper work, data evaluation and todays exam - 61 minutes  Phillips Climes M.D on 02/28/2019 at 12:08 PM  [facility]   Office  [phone]

## 2019-02-28 NOTE — Progress Notes (Signed)
Spoke with Irfat, RN concerning verification for [facility] that PICC is in proper place. She stated the she printed out Progress Note from T. Rigg, RN about PICC placement. This author informed her of the [place] EKG [employer] confirming proper placement of PICC tip in the SVC in proximity to the CAJ.

## 2019-02-28 NOTE — Progress Notes (Signed)
Physical Therapy Treatment Patient Details Name: ADOLPHUS HANF MRN: [id number] DOB: 1956-07-27 Today's Date: 02/28/2019    History of Present Illness 63 year old with a history of AAA, anxiety, BPH, left renal pelvis carcinoma, COPD with emphysema, CAD, dementia, diabetes mellitus type 2, hyperlipidemia, obstructive sleep apnea, CVA with residual left-sided weakness, depression came to the hospital for evaluation of lethargy and confusion.  He was also found to have fevers and chills.  Recently had a left-sided ureteral stent placed on 02/18/2019.  Urology was consulted.  Admitted for pyelonephritis treatment.  His work-up was significant for Staphylococcus epidermidis bacteremia.    PT Comments    Progressing with mobility.    Follow Up Recommendations  Home health PT     Equipment Recommendations  None recommended by PT    Recommendations for Other Services       Precautions / Restrictions Precautions Precautions: Fall Restrictions Weight Bearing Restrictions: No    Mobility  Bed Mobility           Sit to supine: Supervision   General bed mobility comments: for lines  Transfers Overall transfer level: Needs assistance Equipment used: None Transfers: Sit to/from Stand Sit to Stand: Supervision         General transfer comment: for safety  Ambulation/Gait Ambulation/Gait assistance: Min guard Gait Distance (Feet): 175 Feet Assistive device: IV Pole Gait Pattern/deviations: Step-through pattern     General Gait Details: close guard for safety. No LOB. Pt tolerated distance well. Mildly unsteady.    Stairs             Wheelchair Mobility    Modified Rankin (Stroke Patients Only)       Balance Overall balance assessment: Mild deficits observed, not formally tested                                          Cognition Arousal/Alertness: Awake/alert Behavior During Therapy: WFL for tasks assessed/performed Overall Cognitive  Status: Within Functional Limits for tasks assessed                                        Exercises      General Comments        Pertinent Vitals/Pain Pain Assessment: Faces Faces Pain Scale: Hurts a little bit Pain Location: stomach Pain Intervention(s): Monitored during session    Home Living                      Prior Function            PT Goals (current goals can now be found in the care plan section) Progress towards PT goals: Progressing toward goals    Frequency    Min 3X/week      PT Plan Current plan remains appropriate    Co-evaluation              AM-PAC PT "6 Clicks" Mobility   Outcome Measure  Help needed turning from your back to your side while in a flat bed without using bedrails?: A Little Help needed moving from lying on your back to sitting on the side of a flat bed without using bedrails?: A Little Help needed moving to and from a bed to a chair (including a wheelchair)?: A Little Help needed standing up  from a chair using your arms (e.g., wheelchair or bedside chair)?: A Little Help needed to walk in hospital room?: A Little Help needed climbing 3-5 steps with a railing? : A Little 6 Click Score: 18    End of Session Equipment Utilized During Treatment: Gait belt Activity Tolerance: Patient tolerated treatment well Patient left: in chair;with call bell/phone within reach   PT Visit Diagnosis: Unsteadiness on feet (R26.81)     Time: 7048-8891 PT Time Calculation (min) (ACUTE ONLY): 13 min  Charges:  $Gait Training: 8-22 mins                        Weston Anna, Dana Pager: [phone] Office: [phone]

## 2019-03-03 ENCOUNTER — Encounter (HOSPITAL_COMMUNITY): Admission: RE | Payer: Self-pay | Source: Other Acute Inpatient Hospital

## 2019-03-03 ENCOUNTER — Ambulatory Visit (HOSPITAL_COMMUNITY)
Admission: RE | Admit: 2019-03-03 | Payer: Non-veteran care | Source: Other Acute Inpatient Hospital | Admitting: Urology

## 2019-03-03 SURGERY — CYSTOSCOPY/URETEROSCOPY/HOLMIUM LASER/STENT PLACEMENT
Anesthesia: General | Laterality: Left

## 2019-03-14 ENCOUNTER — Encounter: Payer: Self-pay | Admitting: Infectious Diseases

## 2019-03-14 ENCOUNTER — Other Ambulatory Visit: Payer: Self-pay

## 2019-03-14 ENCOUNTER — Ambulatory Visit (INDEPENDENT_AMBULATORY_CARE_PROVIDER_SITE_OTHER): Payer: No Typology Code available for payment source | Admitting: Infectious Diseases

## 2019-03-14 DIAGNOSIS — N151 Renal and perinephric abscess: Secondary | ICD-10-CM

## 2019-03-14 DIAGNOSIS — E669 Obesity, unspecified: Secondary | ICD-10-CM

## 2019-03-14 DIAGNOSIS — E1169 Type 2 diabetes mellitus with other specified complication: Secondary | ICD-10-CM

## 2019-03-14 LAB — CBC
HCT: 37.6 % — ABNORMAL LOW (ref 38.5–50.0)
Hemoglobin: 12.2 g/dL — ABNORMAL LOW (ref 13.2–17.1)
MCH: 26.2 pg — ABNORMAL LOW (ref 27.0–33.0)
MCHC: 32.4 g/dL (ref 32.0–36.0)
MCV: 80.7 fL (ref 80.0–100.0)
MPV: 11.5 fL (ref 7.5–12.5)
Platelets: 276 10*3/uL (ref 140–400)
RBC: 4.66 10*6/uL (ref 4.20–5.80)
RDW: 13.2 % (ref 11.0–15.0)
WBC: 6.3 10*3/uL (ref 3.8–10.8)

## 2019-03-14 LAB — COMPREHENSIVE METABOLIC PANEL
AG Ratio: 1.4 (calc) (ref 1.0–2.5)
ALT: 27 U/L (ref 9–46)
AST: 11 U/L (ref 10–35)
Albumin: 4 g/dL (ref 3.6–5.1)
Alkaline phosphatase (APISO): 117 U/L (ref 35–144)
BUN: 14 mg/dL (ref 7–25)
CO2: 25 mmol/L (ref 20–32)
Calcium: 9.7 mg/dL (ref 8.6–10.3)
Chloride: 103 mmol/L (ref 98–110)
Creat: 0.96 mg/dL (ref 0.70–1.25)
GLUCOSE: 200 mg/dL — AB (ref 65–99)
Globulin: 2.9 g/dL (calc) (ref 1.9–3.7)
POTASSIUM: 3.8 mmol/L (ref 3.5–5.3)
Sodium: 140 mmol/L (ref 135–146)
TOTAL PROTEIN: 6.9 g/dL (ref 6.1–8.1)
Total Bilirubin: 0.3 mg/dL (ref 0.2–1.2)

## 2019-03-14 NOTE — Assessment & Plan Note (Signed)
He is at day 21 of his anbx. (per up to date 2-3 weeks) Will stop and remove his PIC.  Will check his labs today as well.  He needs repeat BCx, will ask that he comes back in 1 week for lab appt only.  He will f/u with PCP and Uro.

## 2019-03-14 NOTE — Assessment & Plan Note (Signed)
Remains poorly controlled.  Appreciate PCP f/u.

## 2019-03-14 NOTE — Progress Notes (Signed)
   Subjective:    Patient ID: Wesley [name], male    DOB: 15-Oct-1956, 63 y.o.   MRN: [id number]  HPI 63 y.o. male with hx of DM2 (2015, last A1C 9.7%)), CAD (MI and pacer 2015), prev CVA with L hemiparesis, and L renal pelvic tumor with ablation and ureteral stent placed 3-6.  He was brought to ED on 3-9 with temp 102, chills, lethargy, myalgias, and weakness. His CT showed pyelo with question of perinephric abscess. He was started on vanco/cefepime, his BCx have since resulted S epidermidis (MRSE).  His TEE was (-). He was treated with vanco in hospital, d/c home on 02-28-19. Planned end date of vanco (3-23).  His stent was removed on 3-23.  Today feels "so-so". No problems with IV vanco via his PIC. No fever or chills. Has had ringing in his ears, had prior to vanco. Feels like is more frequent now.  Feels like his urine has an odor.  States his FSG have been 240-250.   Review of Systems  Constitutional: Positive for appetite change and unexpected weight change. Negative for chills and fever.  HENT: Positive for tinnitus.   Gastrointestinal: Positive for abdominal pain. Negative for constipation and diarrhea.  Genitourinary: Negative for difficulty urinating and flank pain.  Musculoskeletal: Positive for back pain.  has been having "acid refulx.. food won't come down".      Objective:   Physical Exam Constitutional:      Appearance: Normal appearance.  HENT:     Mouth/Throat:     Mouth: Mucous membranes are moist.     Pharynx: No oropharyngeal exudate.  Eyes:     Extraocular Movements: Extraocular movements intact.     Pupils: Pupils are equal, round, and reactive to light.  Neck:     Musculoskeletal: Normal range of motion and neck supple.  Cardiovascular:     Rate and Rhythm: Normal rate and regular rhythm.  Pulmonary:     Effort: Pulmonary effort is normal.     Breath sounds: Normal breath sounds.  Abdominal:     General: Bowel sounds are normal.     Palpations:  Abdomen is soft.     Tenderness: There is no abdominal tenderness. There is no right CVA tenderness, left CVA tenderness or guarding.  Musculoskeletal:       Arms:     Right lower leg: No edema.     Left lower leg: No edema.  Neurological:     General: No focal deficit present.     Mental Status: He is alert.           Assessment & Plan:

## 2019-03-14 NOTE — Progress Notes (Addendum)
Per verbal order from Dr Johnnye Sima, 41 cm Single Lumen Peripherally Inserted Central Catheter removed from right basilic, tip intact. No sutures present. RN confirmed length per chart. Dressing was clean and dry. Petroleum dressing applied. Pt advised no heavy lifting with this arm, leave dressing for 24 hours and call the office or seek emergent care if dressing becomes soaked with blood or swelling or sharp pain presents. Patient verbalized understanding and agreement.  Patient's questions answered to their satisfaction. Patient tolerated procedure well, RN walked patient to check out.  RN notified Coretta at [facility]. [name], RN

## 2019-03-21 ENCOUNTER — Other Ambulatory Visit: Payer: No Typology Code available for payment source

## 2019-03-21 ENCOUNTER — Emergency Department (HOSPITAL_COMMUNITY): Payer: No Typology Code available for payment source

## 2019-03-21 ENCOUNTER — Emergency Department (HOSPITAL_COMMUNITY)
Admission: EM | Admit: 2019-03-21 | Discharge: 2019-03-21 | Disposition: A | Discharge status: Home / Self Care | Payer: No Typology Code available for payment source | Attending: Emergency Medicine | Admitting: Emergency Medicine

## 2019-03-21 ENCOUNTER — Telehealth: Payer: Self-pay | Admitting: Behavioral Health

## 2019-03-21 ENCOUNTER — Encounter (HOSPITAL_COMMUNITY): Payer: Self-pay

## 2019-03-21 DIAGNOSIS — I2510 Atherosclerotic heart disease of native coronary artery without angina pectoris: Secondary | ICD-10-CM | POA: Insufficient documentation

## 2019-03-21 DIAGNOSIS — Z950 Presence of cardiac pacemaker: Secondary | ICD-10-CM | POA: Insufficient documentation

## 2019-03-21 DIAGNOSIS — Z794 Long term (current) use of insulin: Secondary | ICD-10-CM | POA: Insufficient documentation

## 2019-03-21 DIAGNOSIS — I10 Essential (primary) hypertension: Secondary | ICD-10-CM | POA: Insufficient documentation

## 2019-03-21 DIAGNOSIS — Z79899 Other long term (current) drug therapy: Secondary | ICD-10-CM | POA: Insufficient documentation

## 2019-03-21 DIAGNOSIS — R112 Nausea with vomiting, unspecified: Secondary | ICD-10-CM

## 2019-03-21 DIAGNOSIS — R350 Frequency of micturition: Secondary | ICD-10-CM | POA: Diagnosis not present

## 2019-03-21 DIAGNOSIS — R197 Diarrhea, unspecified: Secondary | ICD-10-CM | POA: Diagnosis not present

## 2019-03-21 DIAGNOSIS — Z87891 Personal history of nicotine dependence: Secondary | ICD-10-CM | POA: Diagnosis not present

## 2019-03-21 DIAGNOSIS — R531 Weakness: Secondary | ICD-10-CM | POA: Diagnosis not present

## 2019-03-21 DIAGNOSIS — Z7982 Long term (current) use of aspirin: Secondary | ICD-10-CM | POA: Insufficient documentation

## 2019-03-21 DIAGNOSIS — J449 Chronic obstructive pulmonary disease, unspecified: Secondary | ICD-10-CM | POA: Insufficient documentation

## 2019-03-21 DIAGNOSIS — E119 Type 2 diabetes mellitus without complications: Secondary | ICD-10-CM | POA: Diagnosis not present

## 2019-03-21 LAB — URINALYSIS, ROUTINE W REFLEX MICROSCOPIC
Bacteria, UA: NONE SEEN
Bilirubin Urine: NEGATIVE
Glucose, UA: >=500 mg/dL — AB
Hgb urine dipstick: NEGATIVE
Ketones, ur: NEGATIVE mg/dL
Leukocytes,Ua: NEGATIVE
Nitrite: NEGATIVE
Protein, ur: NEGATIVE mg/dL
Specific Gravity, Urine: 1.012 (ref 1.005–1.030)
pH: 5 (ref 5.0–8.0)

## 2019-03-21 LAB — CBC
HCT: 44.6 % (ref 39.0–52.0)
Hemoglobin: 13.9 g/dL (ref 13.0–17.0)
MCH: 26.5 pg (ref 26.0–34.0)
MCHC: 31.2 g/dL (ref 30.0–36.0)
MCV: 85.1 fL (ref 80.0–100.0)
Platelets: 241 10*3/uL (ref 150–400)
RBC: 5.24 MIL/uL (ref 4.22–5.81)
RDW: 13.8 % (ref 11.5–15.5)
WBC: 6.2 10*3/uL (ref 4.0–10.5)
nRBC: 0 % (ref 0.0–0.2)

## 2019-03-21 LAB — I-STAT TROPONIN, ED: Troponin i, poc: 0 ng/mL (ref 0.00–0.08)

## 2019-03-21 LAB — COMPREHENSIVE METABOLIC PANEL
ALT: 29 U/L (ref 0–44)
AST: 19 U/L (ref 15–41)
Albumin: 4.4 g/dL (ref 3.5–5.0)
Alkaline Phosphatase: 155 U/L — ABNORMAL HIGH (ref 38–126)
Anion gap: 14 (ref 5–15)
BUN: 17 mg/dL (ref 8–23)
CO2: 22 mmol/L (ref 22–32)
Calcium: 9.5 mg/dL (ref 8.9–10.3)
Chloride: 95 mmol/L — ABNORMAL LOW (ref 98–111)
Creatinine, Ser: 0.94 mg/dL (ref 0.61–1.24)
GFR calc Af Amer: >60 mL/min (ref >60)
GFR calc non Af Amer: >60 mL/min (ref >60)
Glucose, Bld: 271 mg/dL — ABNORMAL HIGH (ref 70–99)
Potassium: 4.1 mmol/L (ref 3.5–5.1)
Sodium: 131 mmol/L — ABNORMAL LOW (ref 135–145)
Total Bilirubin: 0.5 mg/dL (ref 0.3–1.2)
Total Protein: 8.3 g/dL — ABNORMAL HIGH (ref 6.5–8.1)

## 2019-03-21 LAB — LIPASE, BLOOD: Lipase: 30 U/L (ref 11–51)

## 2019-03-21 MED ORDER — SODIUM CHLORIDE 0.9% FLUSH
3.0000 mL | Freq: Once | INTRAVENOUS | Status: AC
  Administered 2019-03-21: 3 mL via INTRAVENOUS

## 2019-03-21 MED ORDER — SODIUM CHLORIDE 0.9 % IV BOLUS
1000.0000 mL | Freq: Once | INTRAVENOUS | Status: AC
  Administered 2019-03-21: 1000 mL via INTRAVENOUS

## 2019-03-21 NOTE — Discharge Instructions (Addendum)
You have been seen today for nausea, diarrhea, generalized weakness, and urinary symptoms. Please read and follow all provided instructions.   1. Medications: usual home medications 2. Treatment: rest, drink plenty of fluids - stay hydrated 3. Follow Up: Please follow up with your primary doctor in 2 days for discussion of your diagnoses and further evaluation after today's visit; if you do not have a primary care doctor use the resource guide provided to find one; Please return to the ER for any new or worsening symptoms. Please obtain all of your results from medical records or have your doctors office obtain the results - share them with your doctor - you should be seen at your doctors office. Call today to arrange your follow up.  ?  You should return to the ER if you develop severe or worsening symptoms.   Emergency Department Resource Guide 1) Find a Doctor and Pay Out of Pocket Although you won't have to find out who is covered by your insurance plan, it is a good idea to ask around and get recommendations. You will then need to call the office and see if the doctor you have chosen will accept you as a new patient and what types of options they offer for patients who are self-pay. Some doctors offer discounts or will set up payment plans for their patients who do not have insurance, but you will need to ask so you aren't surprised when you get to your appointment.  2) Contact Your Local Health Department Not all health departments have doctors that can see patients for sick visits, but many do, so it is worth a call to see if yours does. If you don't know where your local health department is, you can check in your phone book. The CDC also has a tool to help you locate your state's health department, and many state websites also have listings of all of their local health departments.  3) Find a [facility] If your illness is not likely to be very severe or complicated, you may want to try a  walk in clinic. These are popping up all over the country in pharmacies, drugstores, and shopping centers. They're usually staffed by nurse practitioners or physician assistants that have been trained to treat common illnesses and complaints. They're usually fairly quick and inexpensive. However, if you have serious medical issues or chronic medical problems, these are probably not your best option.  No Primary Care Doctor: Call [facility] at  [phone] - they can help you locate a primary care doctor that  accepts your insurance, provides certain services, etc. Physician Referral Service- [phone]  Chronic Pain Problems: Organization         Address  Phone   Notes  [facility]  [phone] Patients need to be referred by their primary care doctor.   Medication Assistance: Organization         Address  Phone   Notes  [facility] Medication [facility] [address] [place], [place] [address] [phone] --Must be a resident of [facility] -- Must have NO insurance coverage whatsoever (no Medicaid/ Medicare, etc.) -- The pt. MUST have a primary care doctor that directs their care regularly and follows them in the community   [facility]  [phone]   [employer]  [phone]    Agencies that provide inexpensive medical care: Organization         Address  Phone   Notes  [facility]  [phone]   [facility]    [phone]   [facility] [place], [place] [address] [phone]   [place] [address], [place] [phone]   [facility]    [phone]   [facility]    [phone]   [place] and [place] [address], [place] Phone:  [phone], Fax:  [phone] Hours of Operation:  9 am - 6 pm, M-F.  Also accepts Medicaid/Medicare and self-pay.  [facility] for [place] [address], [place] Phone: [phone], Fax: [phone]. Hours of Operation:  8:30 am - 5:30 pm, M-F.  Also accepts Medicaid and self-pay.  [facility] [place] [address] [place] Phone: [phone]   [place], [place], [place] [phone], Ext. [phone] Mondays & Thursdays: 7-9 AM.  First 15 patients are seen on a first come, first serve basis.    [place] Providers:  Organization         Address  Phone   Notes  [facility] [address], [place] [phone] Also accepts self-pay patients.  [facility] [address], [place]  [phone]   [place], [address], [place] [phone]   [facility] [address] [place] [phone]   Lucianne Lei [address], [place]   [phone] Only accepts [place] patients after they have their name applied to their card.   Self-Pay (no insurance) in [facility]:  Organization         Address  Phone   Notes  Sickle Cell Patients, [facility] [place] [phone]   [facility] [place] [phone]   [facility] [place]  [place], [place], [place] [phone]   [facility]/Dr. Osei-Bonsu  [address], [place] or [address], [place] [phone] Phone number for both [place] and [place] locations is the same.  [facility] [address], [place] [phone]   [facility] [address] [place] or [address] [phone]   [facility] [address], [place] [phone], phone; [phone], fax Sees  patients 1st and 3rd Saturday of every month.  Must not qualify for public or private insurance (i.e. Medicaid, Medicare, [place] Health Choice, Veterans' Benefits)  Household income should be no more than 200% of the poverty level The clinic cannot treat you if you are pregnant or think you are pregnant  Sexually transmitted diseases are not treated at the clinic.

## 2019-03-21 NOTE — Telephone Encounter (Signed)
Thank you, he should go to ED  Thanks jeff

## 2019-03-21 NOTE — Telephone Encounter (Signed)
Patient's wife called this morning stating Wesley [name] has been sluggish over the weekend.  She states he was not confused but was weak when he tried to do simple task such as taking the trash out.  She states he has been sleeping a lot more as well. She states this is behavior is new for him. She states his blood sugars have been normal and he has been eating as well.  Wesley [name] is supposed to come in today  to have blood cultures drawn.  Mrs. Dispenza states Wesley [name] was still asleep at the moment.  Informed her when he woke up and was still experiencing these symptoms he needs to call his PCP and or may need to go the the ED for evaluations if symptoms persist.  Informed her Dr. Johnnye Sima would be made aware as well. Pricilla Riffle RN

## 2019-03-21 NOTE — Telephone Encounter (Signed)
Wesley [name] [name]'s Wife, informed her per Dr. Johnnye Sima, patient should go to the Emergency Room for further evaluation.  Wesley [name] verified understanding and states he was going to get [name] up and they would be on their way. Pricilla Riffle RN

## 2019-03-21 NOTE — ED Triage Notes (Addendum)
  C/o fatigue and weakness that started Friday and has progressed.   C/o urinary frequency and burning   Wants to be evaluated for slow to respond, "walking around like a snail", dizziness, weakness fatigue.   Also C/O nausea and diarrhea 3 occurences of diarrhea in past 24 hours.   Patient recently had 2 weeks of IV antibiotics at home through [facility] with Home Health Nurse.   Per wife home health nurse refused to do IV antibiotics and wife has too.    Patient recently admitted for same.    Wife- Tahj Njoku) [phone]

## 2019-03-21 NOTE — ED Notes (Signed)
[name]- [phone] (wife)

## 2019-03-21 NOTE — Telephone Encounter (Signed)
Thanks

## 2019-03-21 NOTE — ED Notes (Signed)
Patient transported to X-ray 
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
## 2019-03-22 LAB — URINE CULTURE: Culture: NO GROWTH

## 2019-07-26 ENCOUNTER — Other Ambulatory Visit: Payer: Self-pay | Admitting: Urology

## 2019-07-28 ENCOUNTER — Encounter (HOSPITAL_COMMUNITY): Payer: Self-pay

## 2019-07-28 NOTE — Patient Instructions (Addendum)
YOU NEED TO HAVE A COVID 19 TEST ON 07-30-2019 AT 1125 AM.  THIS TEST MUST BE DONE BEFORE SURGERY, COME  [address], [place] , [address]. ONCE YOUR COVID TEST IS COMPLETED, PLEASE BEGIN THE QUARANTINE INSTRUCTIONS AS OUTLINED IN YOUR HANDOUT.                Wesley [name]    Your procedure is scheduled on: 08-03-2019  Report to [facility] Main  Entrance  Report to admitting at 745 AM   1 [place].    Call this number if you have problems the morning of surgery [phone]    Remember: Do not eat food or drink liquids :After Midnight. BRUSH YOUR TEETH MORNING OF SURGERY AND RINSE YOUR MOUTH OUT, NO CHEWING GUM CANDY OR MINTS.   BRING CPAP MASK AND TUBING   Take these medicines the morning of surgery with A SIP OF WATER: ALBUTEROL INHALER IF NEEDED AND BRING INHALER, ALBUTEROL NEBULIZER, GABAPENTIN, HYDRAZALINE, REFRESH EYE DROP, PANTAPRAZOLE  DO NOT TAKE ANY DIABETIC MEDICATIONS DAY OF YOUR SURGERY         How to Manage Your Diabetes Before and After Surgery  Why is it important to control my blood sugar before and after surgery? . Improving blood sugar levels before and after surgery helps healing and can limit problems. . A way of improving blood sugar control is eating a healthy diet by: o  Eating less sugar and carbohydrates o  Increasing activity/exercise o  Talking with your doctor about reaching your blood sugar goals . High blood sugars (greater than 180 mg/dL) can raise your risk of infections and slow your recovery, so you will need to focus on controlling your diabetes during the weeks before surgery. . Make sure that the doctor who takes care of your diabetes knows about your planned surgery including the date and location.  How do I manage my blood sugar before surgery? . Check your blood sugar at least 4 times a day, starting 2 days before surgery, to make sure that the level is not  too high or low. o Check your blood sugar the morning of your surgery when you wake up and every 2 hours until you get to the Short Stay unit. . If your blood sugar is less than 70 mg/dL, you will need to treat for low blood sugar: o Do not take insulin. o Treat a low blood sugar (less than 70 mg/dL) with  cup of clear juice (cranberry or apple), 4 glucose tablets, OR glucose gel. o Recheck blood sugar in 15 minutes after treatment (to make sure it is greater than 70 mg/dL). If your blood sugar is not greater than 70 mg/dL on recheck, call [phone] for further instructions. . Report your blood sugar to the short stay nurse when you get to Short Stay.  . If you are admitted to the hospital after surgery: o Your blood sugar will be checked by the staff and you will probably be given insulin after surgery (instead of oral diabetes medicines) to make sure you have good blood sugar levels. o The goal for blood sugar control after surgery is 80-180 mg/dL.   WHAT DO I DO ABOUT MY DIABETES MEDICATION?  Marland Kitchen Do not take oral diabetes medicines (pills) the morning of surgery.  . THE DAY NIGHT BEFORE SURGERY TAKE METFORMIN AS USUAL.    . THE MORNING OF SURGERY  DO NOT TAKE METFORMIN..   THE DAY BEFORE SURGERY TAKE NOVOLOG AS USUAL.  .  THE DAY OF SURGERY If your CBG is greater than 220 mg/dL, you may take  of your sliding scale  . (correction) dose of insulin.                 You may not have any metal on your body including hair pins and              piercings  Do not wear jewelry, make-up, lotions, powders or perfumes, deodorant             Do not wear nail polish.  Do not shave  48 hours prior to surgery.              Men may shave face and neck.   Do not bring valuables to the hospital. [place].  Contacts, dentures or bridgework may not be worn into surgery.  Leave suitcase in the car. After surgery it may be brought to your  room.   _____________________________________________________________________             [facility] - Preparing for Surgery Before surgery, you can play an important role.  Because skin is not sterile, your skin needs to be as free of germs as possible.  You can reduce the number of germs on your skin by washing with CHG (chlorahexidine gluconate) soap before surgery.  CHG is an antiseptic cleaner which kills germs and bonds with the skin to continue killing germs even after washing. Please DO NOT use if you have an allergy to CHG or antibacterial soaps.  If your skin becomes reddened/irritated stop using the CHG and inform your nurse when you arrive at Short Stay. Do not shave (including legs and underarms) for at least 48 hours prior to the first CHG shower.  You may shave your face/neck. Please follow these instructions carefully:  1.  Shower with CHG Soap the night before surgery and the  morning of Surgery.  2.  If you choose to wash your hair, wash your hair first as usual with your  normal  shampoo.  3.  After you shampoo, rinse your hair and body thoroughly to remove the  shampoo.                           4.  Use CHG as you would any other liquid soap.  You can apply chg directly  to the skin and wash                       Gently with a scrungie or clean washcloth.  5.  Apply the CHG Soap to your body ONLY FROM THE NECK DOWN.   Do not use on face/ open                           Wound or open sores. Avoid contact with eyes, ears mouth and genitals (private parts).                       Wash face,  Genitals (private parts) with your normal soap.             6.  Wash thoroughly, paying special attention to the  area where your surgery  will be performed.  7.  Thoroughly rinse your body with warm water from the neck down.  8.  DO NOT shower/wash with your normal soap after using and rinsing off  the CHG Soap.                9.  Pat yourself dry with a clean towel.            10.  Wear  clean pajamas.            11.  Place clean sheets on your bed the night of your first shower and do not  sleep with pets. Day of Surgery : Do not apply any lotions/deodorants the morning of surgery.  Please wear clean clothes to the hospital/surgery center.  FAILURE TO FOLLOW THESE INSTRUCTIONS MAY RESULT IN THE CANCELLATION OF YOUR SURGERY PATIENT SIGNATURE_________________________________  NURSE SIGNATURE__________________________________  ________________________________________________________________________

## 2019-07-29 ENCOUNTER — Encounter (HOSPITAL_COMMUNITY)
Admission: RE | Admit: 2019-07-29 | Discharge: 2019-07-29 | Disposition: A | Discharge status: Home / Self Care | Payer: No Typology Code available for payment source | Source: Ambulatory Visit | Attending: Urology | Admitting: Urology

## 2019-07-29 ENCOUNTER — Encounter (HOSPITAL_COMMUNITY): Payer: Self-pay

## 2019-07-29 ENCOUNTER — Other Ambulatory Visit: Payer: Self-pay

## 2019-07-29 DIAGNOSIS — F17200 Nicotine dependence, unspecified, uncomplicated: Secondary | ICD-10-CM | POA: Insufficient documentation

## 2019-07-29 DIAGNOSIS — J449 Chronic obstructive pulmonary disease, unspecified: Secondary | ICD-10-CM | POA: Insufficient documentation

## 2019-07-29 DIAGNOSIS — E785 Hyperlipidemia, unspecified: Secondary | ICD-10-CM | POA: Diagnosis not present

## 2019-07-29 DIAGNOSIS — Z01818 Encounter for other preprocedural examination: Secondary | ICD-10-CM | POA: Diagnosis not present

## 2019-07-29 DIAGNOSIS — E119 Type 2 diabetes mellitus without complications: Secondary | ICD-10-CM | POA: Insufficient documentation

## 2019-07-29 DIAGNOSIS — Z7951 Long term (current) use of inhaled steroids: Secondary | ICD-10-CM | POA: Diagnosis not present

## 2019-07-29 DIAGNOSIS — Z950 Presence of cardiac pacemaker: Secondary | ICD-10-CM | POA: Diagnosis not present

## 2019-07-29 DIAGNOSIS — Z794 Long term (current) use of insulin: Secondary | ICD-10-CM | POA: Insufficient documentation

## 2019-07-29 DIAGNOSIS — G250 Essential tremor: Secondary | ICD-10-CM | POA: Insufficient documentation

## 2019-07-29 DIAGNOSIS — N471 Phimosis: Secondary | ICD-10-CM | POA: Diagnosis not present

## 2019-07-29 DIAGNOSIS — C652 Malignant neoplasm of left renal pelvis: Secondary | ICD-10-CM | POA: Diagnosis not present

## 2019-07-29 DIAGNOSIS — I714 Abdominal aortic aneurysm, without rupture: Secondary | ICD-10-CM | POA: Insufficient documentation

## 2019-07-29 DIAGNOSIS — Z79899 Other long term (current) drug therapy: Secondary | ICD-10-CM | POA: Diagnosis not present

## 2019-07-29 DIAGNOSIS — Z7982 Long term (current) use of aspirin: Secondary | ICD-10-CM | POA: Diagnosis not present

## 2019-07-29 DIAGNOSIS — I69354 Hemiplegia and hemiparesis following cerebral infarction affecting left non-dominant side: Secondary | ICD-10-CM | POA: Diagnosis not present

## 2019-07-29 DIAGNOSIS — I495 Sick sinus syndrome: Secondary | ICD-10-CM | POA: Diagnosis not present

## 2019-07-29 DIAGNOSIS — I2510 Atherosclerotic heart disease of native coronary artery without angina pectoris: Secondary | ICD-10-CM | POA: Diagnosis not present

## 2019-07-29 DIAGNOSIS — Z20828 Contact with and (suspected) exposure to other viral communicable diseases: Secondary | ICD-10-CM | POA: Diagnosis not present

## 2019-07-29 DIAGNOSIS — N400 Enlarged prostate without lower urinary tract symptoms: Secondary | ICD-10-CM | POA: Insufficient documentation

## 2019-07-29 LAB — BASIC METABOLIC PANEL
Anion gap: 11 (ref 5–15)
BUN: 20 mg/dL (ref 8–23)
CO2: 25 mmol/L (ref 22–32)
Calcium: 9.9 mg/dL (ref 8.9–10.3)
Chloride: 100 mmol/L (ref 98–111)
Creatinine, Ser: 1.23 mg/dL (ref 0.61–1.24)
GFR calc Af Amer: >60 mL/min (ref >60)
GFR calc non Af Amer: >60 mL/min (ref >60)
Glucose, Bld: 179 mg/dL — ABNORMAL HIGH (ref 70–99)
Potassium: 4.2 mmol/L (ref 3.5–5.1)
Sodium: 136 mmol/L (ref 135–145)

## 2019-07-29 LAB — HEMOGLOBIN A1C
Hgb A1c MFr Bld: 7.1 % — ABNORMAL HIGH (ref 4.8–5.6)
Mean Plasma Glucose: 157.07 mg/dL

## 2019-07-29 LAB — CBC
HCT: 47.7 % (ref 39.0–52.0)
Hemoglobin: 14.5 g/dL (ref 13.0–17.0)
MCH: 26.6 pg (ref 26.0–34.0)
MCHC: 30.4 g/dL (ref 30.0–36.0)
MCV: 87.4 fL (ref 80.0–100.0)
Platelets: 229 10*3/uL (ref 150–400)
RBC: 5.46 MIL/uL (ref 4.22–5.81)
RDW: 14.6 % (ref 11.5–15.5)
WBC: 6.3 10*3/uL (ref 4.0–10.5)
nRBC: 0 % (ref 0.0–0.2)

## 2019-07-29 LAB — GLUCOSE, CAPILLARY: Glucose-Capillary: 268 mg/dL — ABNORMAL HIGH (ref 70–99)

## 2019-07-29 NOTE — Progress Notes (Signed)
Requested device orders to be filled out and last office visit note, last ekg, last echo and stress test from dr Jeanell Sparrow fax [phone]

## 2019-07-30 ENCOUNTER — Other Ambulatory Visit (HOSPITAL_COMMUNITY)
Admission: RE | Admit: 2019-07-30 | Discharge: 2019-07-30 | Disposition: A | Discharge status: Home / Self Care | Payer: No Typology Code available for payment source | Source: Ambulatory Visit | Attending: Urology | Admitting: Urology

## 2019-07-30 DIAGNOSIS — Z01818 Encounter for other preprocedural examination: Secondary | ICD-10-CM | POA: Diagnosis not present

## 2019-07-30 LAB — SARS CORONAVIRUS 2 (TAT 6-24 HRS): SARS Coronavirus 2: NEGATIVE

## 2019-08-01 NOTE — Progress Notes (Signed)
St jude pacemaker orders on chart no rep needed per dr Joan Mayans orders on chart

## 2019-08-01 NOTE — Progress Notes (Signed)
PCP: Senaida Lange  CARDIOLOGIST:holley humphrey  INFO IN Epic:cbc, bmet, hemaglobin a1c 08-08-19 epic, ekg 03-21-19, echo 02-25-19, chest xray 03-21-19  INFO ON CHART:  BLOOD THINNERS AND LAST DOSES:none ____________________________________  PATIENT SYMPTOMS AT TIME OF PREOP:none  Requested information from [place] 07-29-19 and sent device orders to [facility].

## 2019-08-02 MED ORDER — GENTAMICIN SULFATE 40 MG/ML IJ SOLN
5.0000 mg/kg | INTRAVENOUS | Status: AC
  Administered 2019-08-03: 11:00:00 430 mg via INTRAVENOUS
  Filled 2019-08-02: qty 10.75
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
## 2019-08-03 ENCOUNTER — Encounter (HOSPITAL_COMMUNITY)
Admission: RE | Disposition: A | Discharge status: Home / Self Care | Payer: Self-pay | Source: Home / Self Care | Attending: Urology

## 2019-08-03 ENCOUNTER — Ambulatory Visit (HOSPITAL_COMMUNITY): Payer: No Typology Code available for payment source | Admitting: Physician Assistant

## 2019-08-03 ENCOUNTER — Encounter (HOSPITAL_COMMUNITY): Payer: Self-pay

## 2019-08-03 ENCOUNTER — Other Ambulatory Visit: Payer: Self-pay

## 2019-08-03 ENCOUNTER — Ambulatory Visit (HOSPITAL_COMMUNITY): Payer: No Typology Code available for payment source

## 2019-08-03 ENCOUNTER — Observation Stay (HOSPITAL_COMMUNITY)
Admission: RE | Admit: 2019-08-03 | Discharge: 2019-08-04 | Disposition: A | Discharge status: Home / Self Care | Payer: No Typology Code available for payment source | Attending: Urology | Admitting: Urology

## 2019-08-03 ENCOUNTER — Ambulatory Visit (HOSPITAL_COMMUNITY): Payer: No Typology Code available for payment source | Admitting: Anesthesiology

## 2019-08-03 DIAGNOSIS — Z8611 Personal history of tuberculosis: Secondary | ICD-10-CM | POA: Insufficient documentation

## 2019-08-03 DIAGNOSIS — C652 Malignant neoplasm of left renal pelvis: Secondary | ICD-10-CM | POA: Diagnosis present

## 2019-08-03 DIAGNOSIS — I714 Abdominal aortic aneurysm, without rupture: Secondary | ICD-10-CM | POA: Diagnosis not present

## 2019-08-03 DIAGNOSIS — Z9841 Cataract extraction status, right eye: Secondary | ICD-10-CM | POA: Insufficient documentation

## 2019-08-03 DIAGNOSIS — I2510 Atherosclerotic heart disease of native coronary artery without angina pectoris: Secondary | ICD-10-CM | POA: Insufficient documentation

## 2019-08-03 DIAGNOSIS — E119 Type 2 diabetes mellitus without complications: Secondary | ICD-10-CM | POA: Insufficient documentation

## 2019-08-03 DIAGNOSIS — M19042 Primary osteoarthritis, left hand: Secondary | ICD-10-CM | POA: Insufficient documentation

## 2019-08-03 DIAGNOSIS — M479 Spondylosis, unspecified: Secondary | ICD-10-CM | POA: Diagnosis not present

## 2019-08-03 DIAGNOSIS — Z87440 Personal history of urinary (tract) infections: Secondary | ICD-10-CM | POA: Diagnosis not present

## 2019-08-03 DIAGNOSIS — N481 Balanitis: Secondary | ICD-10-CM | POA: Insufficient documentation

## 2019-08-03 DIAGNOSIS — Z801 Family history of malignant neoplasm of trachea, bronchus and lung: Secondary | ICD-10-CM | POA: Insufficient documentation

## 2019-08-03 DIAGNOSIS — R339 Retention of urine, unspecified: Secondary | ICD-10-CM | POA: Insufficient documentation

## 2019-08-03 DIAGNOSIS — R81 Glycosuria: Secondary | ICD-10-CM | POA: Diagnosis not present

## 2019-08-03 DIAGNOSIS — E785 Hyperlipidemia, unspecified: Secondary | ICD-10-CM | POA: Diagnosis not present

## 2019-08-03 DIAGNOSIS — F5104 Psychophysiologic insomnia: Secondary | ICD-10-CM | POA: Diagnosis not present

## 2019-08-03 DIAGNOSIS — J449 Chronic obstructive pulmonary disease, unspecified: Secondary | ICD-10-CM | POA: Insufficient documentation

## 2019-08-03 DIAGNOSIS — H9193 Unspecified hearing loss, bilateral: Secondary | ICD-10-CM | POA: Insufficient documentation

## 2019-08-03 DIAGNOSIS — F419 Anxiety disorder, unspecified: Secondary | ICD-10-CM | POA: Insufficient documentation

## 2019-08-03 DIAGNOSIS — F329 Major depressive disorder, single episode, unspecified: Secondary | ICD-10-CM | POA: Insufficient documentation

## 2019-08-03 DIAGNOSIS — M19041 Primary osteoarthritis, right hand: Secondary | ICD-10-CM | POA: Diagnosis not present

## 2019-08-03 DIAGNOSIS — M170 Bilateral primary osteoarthritis of knee: Secondary | ICD-10-CM | POA: Diagnosis not present

## 2019-08-03 DIAGNOSIS — I69354 Hemiplegia and hemiparesis following cerebral infarction affecting left non-dominant side: Secondary | ICD-10-CM | POA: Diagnosis not present

## 2019-08-03 DIAGNOSIS — M19072 Primary osteoarthritis, left ankle and foot: Secondary | ICD-10-CM | POA: Insufficient documentation

## 2019-08-03 DIAGNOSIS — Z8553 Personal history of malignant neoplasm of renal pelvis: Secondary | ICD-10-CM | POA: Diagnosis not present

## 2019-08-03 DIAGNOSIS — Z820 Family history of epilepsy and other diseases of the nervous system: Secondary | ICD-10-CM | POA: Insufficient documentation

## 2019-08-03 DIAGNOSIS — G4730 Sleep apnea, unspecified: Secondary | ICD-10-CM | POA: Insufficient documentation

## 2019-08-03 DIAGNOSIS — N400 Enlarged prostate without lower urinary tract symptoms: Secondary | ICD-10-CM | POA: Insufficient documentation

## 2019-08-03 DIAGNOSIS — M19071 Primary osteoarthritis, right ankle and foot: Secondary | ICD-10-CM | POA: Diagnosis not present

## 2019-08-03 DIAGNOSIS — R251 Tremor, unspecified: Secondary | ICD-10-CM | POA: Insufficient documentation

## 2019-08-03 DIAGNOSIS — Z79899 Other long term (current) drug therapy: Secondary | ICD-10-CM | POA: Insufficient documentation

## 2019-08-03 DIAGNOSIS — Z950 Presence of cardiac pacemaker: Secondary | ICD-10-CM | POA: Insufficient documentation

## 2019-08-03 DIAGNOSIS — M109 Gout, unspecified: Secondary | ICD-10-CM | POA: Diagnosis not present

## 2019-08-03 DIAGNOSIS — Z794 Long term (current) use of insulin: Secondary | ICD-10-CM | POA: Insufficient documentation

## 2019-08-03 DIAGNOSIS — I252 Old myocardial infarction: Secondary | ICD-10-CM | POA: Insufficient documentation

## 2019-08-03 DIAGNOSIS — R569 Unspecified convulsions: Secondary | ICD-10-CM | POA: Insufficient documentation

## 2019-08-03 DIAGNOSIS — Z881 Allergy status to other antibiotic agents status: Secondary | ICD-10-CM | POA: Insufficient documentation

## 2019-08-03 DIAGNOSIS — Z9842 Cataract extraction status, left eye: Secondary | ICD-10-CM | POA: Insufficient documentation

## 2019-08-03 DIAGNOSIS — F0390 Unspecified dementia without behavioral disturbance: Secondary | ICD-10-CM | POA: Insufficient documentation

## 2019-08-03 DIAGNOSIS — N471 Phimosis: Secondary | ICD-10-CM | POA: Diagnosis not present

## 2019-08-03 DIAGNOSIS — F17210 Nicotine dependence, cigarettes, uncomplicated: Secondary | ICD-10-CM | POA: Insufficient documentation

## 2019-08-03 DIAGNOSIS — Z885 Allergy status to narcotic agent status: Secondary | ICD-10-CM | POA: Insufficient documentation

## 2019-08-03 DIAGNOSIS — K219 Gastro-esophageal reflux disease without esophagitis: Secondary | ICD-10-CM | POA: Insufficient documentation

## 2019-08-03 DIAGNOSIS — Z809 Family history of malignant neoplasm, unspecified: Secondary | ICD-10-CM | POA: Insufficient documentation

## 2019-08-03 DIAGNOSIS — I495 Sick sinus syndrome: Secondary | ICD-10-CM | POA: Insufficient documentation

## 2019-08-03 DIAGNOSIS — I10 Essential (primary) hypertension: Secondary | ICD-10-CM | POA: Insufficient documentation

## 2019-08-03 HISTORY — PX: CYSTOSCOPY/RETROGRADE/URETEROSCOPY: SHX5316

## 2019-08-03 HISTORY — PX: CIRCUMCISION: SHX1350

## 2019-08-03 HISTORY — PX: TRANSURETHRAL RESECTION OF PROSTATE: SHX73

## 2019-08-03 LAB — GLUCOSE, CAPILLARY
Glucose-Capillary: 161 mg/dL — ABNORMAL HIGH (ref 70–99)
Glucose-Capillary: 115 mg/dL — ABNORMAL HIGH (ref 70–99)
Glucose-Capillary: 196 mg/dL — ABNORMAL HIGH (ref 70–99)
Glucose-Capillary: 175 mg/dL — ABNORMAL HIGH (ref 70–99)

## 2019-08-03 SURGERY — CYSTOSCOPY/RETROGRADE/URETEROSCOPY
Anesthesia: General

## 2019-08-03 MED ORDER — NICOTINE 7 MG/24HR TD PT24
7.0000 mg | MEDICATED_PATCH | Freq: Every day | TRANSDERMAL | Status: DC
  Administered 2019-08-03 – 2019-08-04 (×2): 7 mg via TRANSDERMAL
  Filled 2019-08-03 (×2): qty 1

## 2019-08-03 MED ORDER — FINASTERIDE 5 MG PO TABS
5.0000 mg | ORAL_TABLET | Freq: Every day | ORAL | Status: DC
  Administered 2019-08-03 – 2019-08-04 (×2): 5 mg via ORAL
  Filled 2019-08-03 (×2): qty 1

## 2019-08-03 MED ORDER — LACTATED RINGERS IV SOLN
INTRAVENOUS | Status: DC
  Administered 2019-08-03: 09:00:00 via INTRAVENOUS

## 2019-08-03 MED ORDER — LORATADINE 10 MG PO TABS
10.0000 mg | ORAL_TABLET | Freq: Every day | ORAL | Status: DC
  Administered 2019-08-03 – 2019-08-04 (×2): 10 mg via ORAL
  Filled 2019-08-03 (×2): qty 1

## 2019-08-03 MED ORDER — HYDROCHLOROTHIAZIDE 25 MG PO TABS
25.0000 mg | ORAL_TABLET | Freq: Every day | ORAL | Status: DC
  Administered 2019-08-03 – 2019-08-04 (×2): 25 mg via ORAL
  Filled 2019-08-03 (×2): qty 1

## 2019-08-03 MED ORDER — ATORVASTATIN CALCIUM 40 MG PO TABS
40.0000 mg | ORAL_TABLET | Freq: Every day | ORAL | Status: DC
  Administered 2019-08-03: 40 mg via ORAL
  Filled 2019-08-03: qty 1

## 2019-08-03 MED ORDER — DEXAMETHASONE SODIUM PHOSPHATE 10 MG/ML IJ SOLN
INTRAMUSCULAR | Status: AC
  Filled 2019-08-03: qty 1

## 2019-08-03 MED ORDER — ALBUTEROL SULFATE HFA 108 (90 BASE) MCG/ACT IN AERS: 2.0000 | INHALATION_SPRAY | Freq: Four times a day (QID) | RESPIRATORY_TRACT | Status: DC | PRN

## 2019-08-03 MED ORDER — LIDOCAINE HCL (PF) 1 % IJ SOLN
INTRAMUSCULAR | Status: AC
  Filled 2019-08-03: qty 30

## 2019-08-03 MED ORDER — ACETAMINOPHEN 500 MG PO TABS
1000.0000 mg | ORAL_TABLET | Freq: Three times a day (TID) | ORAL | Status: AC
  Administered 2019-08-03 – 2019-08-04 (×3): 1000 mg via ORAL
  Filled 2019-08-03 (×3): qty 2

## 2019-08-03 MED ORDER — LIDOCAINE HCL (CARDIAC) PF 100 MG/5ML IV SOSY
PREFILLED_SYRINGE | INTRAVENOUS | Status: DC | PRN
  Administered 2019-08-03: 60 mg via INTRAVENOUS

## 2019-08-03 MED ORDER — FENTANYL CITRATE (PF) 250 MCG/5ML IJ SOLN
INTRAMUSCULAR | Status: DC | PRN
  Administered 2019-08-03: 50 ug via INTRAVENOUS
  Administered 2019-08-03 (×2): 25 ug via INTRAVENOUS
  Administered 2019-08-03: 50 ug via INTRAVENOUS

## 2019-08-03 MED ORDER — PROPOFOL 10 MG/ML IV BOLUS
INTRAVENOUS | Status: AC
  Filled 2019-08-03: qty 20

## 2019-08-03 MED ORDER — 0.9 % SODIUM CHLORIDE (POUR BTL) OPTIME
TOPICAL | Status: DC | PRN
  Administered 2019-08-03: 1000 mL

## 2019-08-03 MED ORDER — FENTANYL CITRATE (PF) 250 MCG/5ML IJ SOLN
INTRAMUSCULAR | Status: AC
  Filled 2019-08-03: qty 5

## 2019-08-03 MED ORDER — FENTANYL CITRATE (PF) 100 MCG/2ML IJ SOLN: 25.0000 ug | INTRAMUSCULAR | Status: DC | PRN

## 2019-08-03 MED ORDER — LIDOCAINE HCL (PF) 1 % IJ SOLN
INTRAMUSCULAR | Status: DC | PRN
  Administered 2019-08-03: 21 mL

## 2019-08-03 MED ORDER — DILTIAZEM HCL ER COATED BEADS 180 MG PO CP24
180.0000 mg | ORAL_CAPSULE | Freq: Every day | ORAL | Status: DC
  Administered 2019-08-03: 180 mg via ORAL
  Filled 2019-08-03: qty 1

## 2019-08-03 MED ORDER — PROPOFOL 10 MG/ML IV BOLUS
INTRAVENOUS | Status: DC | PRN
  Administered 2019-08-03: 100 mg via INTRAVENOUS
  Administered 2019-08-03 (×2): 20 mg via INTRAVENOUS

## 2019-08-03 MED ORDER — SODIUM CHLORIDE 0.9 % IV SOLN
INTRAVENOUS | Status: DC
  Administered 2019-08-03 (×2): via INTRAVENOUS

## 2019-08-03 MED ORDER — VENLAFAXINE HCL ER 150 MG PO CP24
150.0000 mg | ORAL_CAPSULE | Freq: Every day | ORAL | Status: DC
  Administered 2019-08-03: 150 mg via ORAL
  Filled 2019-08-03: qty 1

## 2019-08-03 MED ORDER — LEVETIRACETAM 500 MG PO TABS: 500.0000 mg | ORAL_TABLET | Freq: Two times a day (BID) | ORAL | Status: DC

## 2019-08-03 MED ORDER — MIRTAZAPINE 15 MG PO TABS
7.5000 mg | ORAL_TABLET | Freq: Every day | ORAL | Status: DC
  Administered 2019-08-03: 7.5 mg via ORAL
  Filled 2019-08-03: qty 1

## 2019-08-03 MED ORDER — METFORMIN HCL 500 MG PO TABS
1000.0000 mg | ORAL_TABLET | Freq: Two times a day (BID) | ORAL | Status: DC
  Administered 2019-08-03 – 2019-08-04 (×2): 1000 mg via ORAL
  Filled 2019-08-03 (×2): qty 2

## 2019-08-03 MED ORDER — DEXAMETHASONE SODIUM PHOSPHATE 10 MG/ML IJ SOLN
INTRAMUSCULAR | Status: DC | PRN
  Administered 2019-08-03: 5 mg via INTRAVENOUS

## 2019-08-03 MED ORDER — LIDOCAINE 2% (20 MG/ML) 5 ML SYRINGE
INTRAMUSCULAR | Status: AC
  Filled 2019-08-03: qty 5

## 2019-08-03 MED ORDER — LEVETIRACETAM 500 MG PO TABS
1000.0000 mg | ORAL_TABLET | Freq: Every day | ORAL | Status: DC
  Administered 2019-08-03: 1000 mg via ORAL
  Filled 2019-08-03: qty 2

## 2019-08-03 MED ORDER — HYDROMORPHONE HCL 1 MG/ML IJ SOLN
0.5000 mg | INTRAMUSCULAR | Status: DC | PRN
  Administered 2019-08-03: 1 mg via INTRAVENOUS
  Filled 2019-08-03 (×2): qty 1

## 2019-08-03 MED ORDER — BRIMONIDINE TARTRATE 0.2 % OP SOLN
1.0000 [drp] | Freq: Every day | OPHTHALMIC | Status: DC
  Administered 2019-08-03: 1 [drp] via OPHTHALMIC
  Filled 2019-08-03: qty 5

## 2019-08-03 MED ORDER — GABAPENTIN 300 MG PO CAPS
300.0000 mg | ORAL_CAPSULE | Freq: Three times a day (TID) | ORAL | Status: DC
  Administered 2019-08-03 – 2019-08-04 (×3): 300 mg via ORAL
  Filled 2019-08-03 (×3): qty 1

## 2019-08-03 MED ORDER — STERILE WATER FOR IRRIGATION IR SOLN
Status: DC | PRN
  Administered 2019-08-03: 500 mL

## 2019-08-03 MED ORDER — LEVETIRACETAM 500 MG PO TABS
500.0000 mg | ORAL_TABLET | Freq: Every morning | ORAL | Status: DC
  Administered 2019-08-03 – 2019-08-04 (×2): 500 mg via ORAL
  Filled 2019-08-03 (×2): qty 1

## 2019-08-03 MED ORDER — ONDANSETRON HCL 4 MG/2ML IJ SOLN
INTRAMUSCULAR | Status: AC
  Filled 2019-08-03: qty 2

## 2019-08-03 MED ORDER — ONDANSETRON HCL 4 MG/2ML IJ SOLN
INTRAMUSCULAR | Status: DC | PRN
  Administered 2019-08-03: 4 mg via INTRAVENOUS

## 2019-08-03 MED ORDER — SODIUM CHLORIDE 0.9 % IR SOLN
3000.0000 mL | Status: DC
  Administered 2019-08-03: 3000 mL

## 2019-08-03 MED ORDER — SENNOSIDES-DOCUSATE SODIUM 8.6-50 MG PO TABS
1.0000 | ORAL_TABLET | Freq: Two times a day (BID) | ORAL | Status: DC
  Administered 2019-08-03 – 2019-08-04 (×2): 1 via ORAL
  Filled 2019-08-03 (×2): qty 1

## 2019-08-03 MED ORDER — CYCLOBENZAPRINE HCL 10 MG PO TABS: 10.0000 mg | ORAL_TABLET | Freq: Every evening | ORAL | Status: DC | PRN

## 2019-08-03 MED ORDER — LURASIDONE HCL 20 MG PO TABS
20.0000 mg | ORAL_TABLET | Freq: Every day | ORAL | Status: DC
  Administered 2019-08-03: 20 mg via ORAL
  Filled 2019-08-03 (×2): qty 1

## 2019-08-03 MED ORDER — SODIUM CHLORIDE 0.9 % IR SOLN
Status: DC | PRN
  Administered 2019-08-03: 6000 mL

## 2019-08-03 MED ORDER — PHENYLEPHRINE 40 MCG/ML (10ML) SYRINGE FOR IV PUSH (FOR BLOOD PRESSURE SUPPORT)
PREFILLED_SYRINGE | INTRAVENOUS | Status: DC | PRN
  Administered 2019-08-03 (×2): 80 ug via INTRAVENOUS
  Administered 2019-08-03 (×2): 40 ug via INTRAVENOUS
  Administered 2019-08-03 (×5): 80 ug via INTRAVENOUS

## 2019-08-03 MED ORDER — MIDAZOLAM HCL 5 MG/5ML IJ SOLN
INTRAMUSCULAR | Status: DC | PRN
  Administered 2019-08-03 (×2): 1 mg via INTRAVENOUS

## 2019-08-03 MED ORDER — CLINDAMYCIN PHOSPHATE 900 MG/50ML IV SOLN
900.0000 mg | INTRAVENOUS | Status: AC
  Administered 2019-08-03: 900 mg via INTRAVENOUS
  Filled 2019-08-03: qty 50

## 2019-08-03 MED ORDER — MIDAZOLAM HCL 2 MG/2ML IJ SOLN
INTRAMUSCULAR | Status: AC
  Filled 2019-08-03: qty 2

## 2019-08-03 MED ORDER — PANTOPRAZOLE SODIUM 40 MG PO TBEC
40.0000 mg | DELAYED_RELEASE_TABLET | Freq: Two times a day (BID) | ORAL | Status: DC
  Administered 2019-08-03 – 2019-08-04 (×2): 40 mg via ORAL
  Filled 2019-08-03 (×2): qty 1

## 2019-08-03 MED ORDER — OXYCODONE HCL 5 MG PO TABS
5.0000 mg | ORAL_TABLET | ORAL | Status: DC | PRN
  Administered 2019-08-03 – 2019-08-04 (×4): 5 mg via ORAL
  Filled 2019-08-03 (×4): qty 1

## 2019-08-03 MED ORDER — ALBUTEROL SULFATE (2.5 MG/3ML) 0.083% IN NEBU: 2.5000 mg | INHALATION_SOLUTION | Freq: Four times a day (QID) | RESPIRATORY_TRACT | Status: DC | PRN

## 2019-08-03 MED ORDER — IOHEXOL 300 MG/ML  SOLN
INTRAMUSCULAR | Status: DC | PRN
  Administered 2019-08-03: 20 mL

## 2019-08-03 SURGICAL SUPPLY — 55 items
BAG URINE DRAINAGE (UROLOGICAL SUPPLIES) ×4 IMPLANT
BAG URO CATCHER STRL LF (MISCELLANEOUS) ×4 IMPLANT
BASKET LASER NITINOL 1.9FR (BASKET) IMPLANT
BLADE SURG 15 STRL LF DISP TIS (BLADE) ×2 IMPLANT
BLADE SURG 15 STRL SS (BLADE) ×2
BNDG COHESIVE 2X5 TAN STRL LF (GAUZE/BANDAGES/DRESSINGS) ×4 IMPLANT
BNDG CONFORM 2 STRL LF (GAUZE/BANDAGES/DRESSINGS) ×4 IMPLANT
CATH FOLEY 3WAY 30CC 24FR (CATHETERS) ×4
CATH HEMA 3WAY 30CC 22FR COUDE (CATHETERS) IMPLANT
CATH INTERMIT  6FR 70CM (CATHETERS) ×4 IMPLANT
CATH URTH STD 24FR FL 3W 2 (CATHETERS) ×2 IMPLANT
CLOTH BEACON ORANGE TIMEOUT ST (SAFETY) IMPLANT
COVER WAND RF STERILE (DRAPES) IMPLANT
DECANTER SPIKE VIAL GLASS SM (MISCELLANEOUS) IMPLANT
DRAPE LAPAROTOMY T 102X78X121 (DRAPES) IMPLANT
ELECT NEEDLE TIP 2.8 STRL (NEEDLE) IMPLANT
ELECT PENCIL ROCKER SW 15FT (MISCELLANEOUS) ×4 IMPLANT
ELECT REM PT RETURN 15FT ADLT (MISCELLANEOUS) ×4 IMPLANT
EXTRACTOR STONE 1.7FRX115CM (UROLOGICAL SUPPLIES) IMPLANT
FIBER LASER FLEXIVA 1000 (UROLOGICAL SUPPLIES) IMPLANT
FIBER LASER FLEXIVA 365 (UROLOGICAL SUPPLIES) IMPLANT
FIBER LASER FLEXIVA 550 (UROLOGICAL SUPPLIES) IMPLANT
FIBER LASER TRAC TIP (UROLOGICAL SUPPLIES) IMPLANT
GAUZE 4X4 16PLY RFD (DISPOSABLE) ×4 IMPLANT
GAUZE SPONGE 4X4 12PLY STRL (GAUZE/BANDAGES/DRESSINGS) IMPLANT
GAUZE XEROFORM 1X8 LF (GAUZE/BANDAGES/DRESSINGS) ×4 IMPLANT
GLOVE BIOGEL M STRL SZ7.5 (GLOVE) ×4 IMPLANT
GOWN STRL REUS W/TWL LRG LVL3 (GOWN DISPOSABLE) ×8 IMPLANT
GOWN STRL REUS W/TWL XL LVL3 (GOWN DISPOSABLE) ×4 IMPLANT
GUIDEWIRE ANG ZIPWIRE 038X150 (WIRE) ×4 IMPLANT
GUIDEWIRE STR DUAL SENSOR (WIRE) ×4 IMPLANT
HOLDER FOLEY CATH W/STRAP (MISCELLANEOUS) IMPLANT
KIT BASIN OR (CUSTOM PROCEDURE TRAY) ×4 IMPLANT
KIT TURNOVER KIT A (KITS) IMPLANT
LOOP CUT BIPOLAR 24F LRG (ELECTROSURGICAL) ×4 IMPLANT
MANIFOLD NEPTUNE II (INSTRUMENTS) ×4 IMPLANT
NEEDLE HYPO 25X1 1.5 SAFETY (NEEDLE) ×4 IMPLANT
NS IRRIG 1000ML POUR BTL (IV SOLUTION) IMPLANT
PACK BASIC VI WITH GOWN DISP (CUSTOM PROCEDURE TRAY) ×4 IMPLANT
PACK CYSTO (CUSTOM PROCEDURE TRAY) ×4 IMPLANT
SHEATH URETERAL 12FRX28CM (UROLOGICAL SUPPLIES) IMPLANT
SHEATH URETERAL 12FRX35CM (MISCELLANEOUS) ×4 IMPLANT
SUT CHROMIC 4 0 P 3 18 (SUTURE) ×8 IMPLANT
SUT CHROMIC 4 0 PS 2 18 (SUTURE) IMPLANT
SUT VIC AB 3-0 SH 27 (SUTURE) ×6
SUT VIC AB 3-0 SH 27XBRD (SUTURE) ×4 IMPLANT
SYR 30ML LL (SYRINGE) ×4 IMPLANT
SYR CONTROL 10ML LL (SYRINGE) ×4 IMPLANT
SYRINGE IRR TOOMEY STRL 70CC (SYRINGE) ×4 IMPLANT
TOWEL OR 17X26 10 PK STRL BLUE (TOWEL DISPOSABLE) ×4 IMPLANT
TUBE FEEDING 8FR 16IN STR KANG (MISCELLANEOUS) ×4 IMPLANT
TUBING CONNECTING 10 (TUBING) ×3 IMPLANT
TUBING CONNECTING 10' (TUBING) ×1
TUBING UROLOGY SET (TUBING) ×4 IMPLANT
WATER STERILE IRR 1000ML POUR (IV SOLUTION) IMPLANT
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
## 2019-08-03 NOTE — Brief Op Note (Signed)
08/03/2019  12:05 PM  PATIENT:  Wesley [name]  63 y.o. male  PRE-OPERATIVE DIAGNOSIS:  LEFT RENAL PELVIS CANCER, PHIMOSIS, LOWER TRACT SYMPTOMS  POST-OPERATIVE DIAGNOSIS:  LEFT RENAL PELVIS CANCER, PHIMOSIS, LOWER TRACT SYMPTOMS  PROCEDURE:  Procedure(s): CYSTOSCOPY, BILATERAL RETROGRADE PYELOGRAM, LEFT URETEROSCOPY (Bilateral) TRANSURETHRAL RESECTION OF THE PROSTATE (TURP) (N/A) CIRCUMCISION ADULT (N/A)  SURGEON:  Surgeon(s) and Role:    * Alexis Frock, MD - Primary  PHYSICIAN ASSISTANT:   ASSISTANTS: none   ANESTHESIA:   general  EBL:  50 mL   BLOOD ADMINISTERED:none  DRAINS: 3 way foley to NS irrigation   LOCAL MEDICATIONS USED:  MARCAINE     SPECIMEN:  Source of Specimen:  1 -prostate chips - pathology; 2 - foreskin for discard  DISPOSITION OF SPECIMEN:  PATHOLOGY  COUNTS:  YES  TOURNIQUET:  * No tourniquets in log *  DICTATION: .Other Dictation: Dictation Number  [id number]  PLAN OF CARE: Admit for overnight observation  PATIENT DISPOSITION:  PACU - hemodynamically stable.   Delay start of Pharmacological VTE agent (>24hrs) due to surgical blood loss or risk of bleeding: yes

## 2019-08-03 NOTE — Anesthesia Preprocedure Evaluation (Addendum)
Anesthesia Evaluation  Patient identified by MRN, date of birth, ID band Patient awake    Reviewed: Allergy & Precautions, NPO status , Patient's Chart, lab work & pertinent test results  History of Anesthesia Complications Negative for: history of anesthetic complications  Airway Mallampati: II  TM Distance: >3 FB Neck ROM: Full    Dental  (+) Missing,    Pulmonary shortness of breath, sleep apnea , COPD,  COPD inhaler, neg recent URI, Current Smoker, former smoker,    Pulmonary exam normal        Cardiovascular hypertension, + CAD, + Past MI and + Peripheral Vascular Disease  Normal cardiovascular exam+ pacemaker      Neuro/Psych Seizures -,  PSYCHIATRIC DISORDERS Anxiety Depression Dementia Left weakness  Neuromuscular disease CVA, Residual Symptoms    GI/Hepatic GERD  ,(+)     substance abuse  cocaine use, Not acutely toxic   Endo/Other  diabetes, Insulin Dependent  Renal/GU      Musculoskeletal  (+) Arthritis ,   Abdominal   Peds  Hematology   Anesthesia Other Findings   Reproductive/Obstetrics                            Anesthesia Physical  Anesthesia Plan  ASA: III  Anesthesia Plan: General   Post-op Pain Management:    Induction: Intravenous  PONV Risk Score and Plan: 2 and Ondansetron and Dexamethasone  Airway Management Planned: LMA  Additional Equipment: None  Intra-op Plan:   Post-operative Plan: Extubation in OR  Informed Consent: I have reviewed the patients History and Physical, chart, labs and discussed the procedure including the risks, benefits and alternatives for the proposed anesthesia with the patient or authorized representative who has indicated his/her understanding and acceptance.     Dental advisory given  Plan Discussed with:   Anesthesia Plan Comments:         Anesthesia Quick Evaluation

## 2019-08-03 NOTE — Transfer of Care (Signed)
Immediate Anesthesia Transfer of Care Note  Patient: Wesley [name]  Procedure(s) Performed: CYSTOSCOPY, BILATERAL RETROGRADE PYELOGRAM, LEFT URETEROSCOPY (Bilateral ) TRANSURETHRAL RESECTION OF THE PROSTATE (TURP) (N/A ) CIRCUMCISION ADULT (N/A )  Patient Location: PACU  Anesthesia Type:General  Level of Consciousness: drowsy, patient cooperative and responds to stimulation  Airway & Oxygen Therapy: Patient Spontanous Breathing and Patient connected to face mask oxygen  Post-op Assessment: Report given to RN and Post -op Vital signs reviewed and stable  Post vital signs: Reviewed and stable  Last Vitals:  Vitals Value Taken Time  BP 138/88 08/03/19 1215  Temp 36.1 C 08/03/19 1215  Pulse 64 08/03/19 1217  Resp 12 08/03/19 1217  SpO2 100 % 08/03/19 1217  Vitals shown include unvalidated device data.  Last Pain:  Vitals:   08/03/19 0850  TempSrc: Oral  PainSc:          Complications: No apparent anesthesia complications

## 2019-08-03 NOTE — Anesthesia Postprocedure Evaluation (Signed)
Anesthesia Post Note  Patient: MEREDITH MELLS  Procedure(s) Performed: CYSTOSCOPY, BILATERAL RETROGRADE PYELOGRAM, LEFT URETEROSCOPY (Bilateral ) TRANSURETHRAL RESECTION OF THE PROSTATE (TURP) (N/A ) CIRCUMCISION ADULT (N/A )     Patient location during evaluation: PACU Anesthesia Type: General Level of consciousness: awake and alert Pain management: pain level controlled Vital Signs Assessment: post-procedure vital signs reviewed and stable Respiratory status: spontaneous breathing, nonlabored ventilation, respiratory function stable and patient connected to nasal cannula oxygen Cardiovascular status: blood pressure returned to baseline and stable Postop Assessment: no apparent nausea or vomiting Anesthetic complications: no    Last Vitals:  Vitals:   08/03/19 1350 08/03/19 1726  BP: 125/80 134/90  Pulse: 61 60  Resp: 16 15  Temp: 36.6 C 36.5 C  SpO2: 99% 95%    Last Pain:  Vitals:   08/03/19 1726  TempSrc: Oral  PainSc:                  Karon Cotterill L Mosie Angus

## 2019-08-03 NOTE — Anesthesia Procedure Notes (Signed)
Procedure Name: LMA Insertion Date/Time: 08/03/2019 10:27 AM Performed by: Glory Buff, CRNA Pre-anesthesia Checklist: Patient identified, Emergency Drugs available, Suction available and Patient being monitored Patient Re-evaluated:Patient Re-evaluated prior to induction Oxygen Delivery Method: Circle system utilized Preoxygenation: Pre-oxygenation with 100% oxygen Induction Type: IV induction Ventilation: Mask ventilation without difficulty LMA: LMA inserted LMA Size: 4.0 Number of attempts: 1 Placement Confirmation: positive ETCO2 Tube secured with: Tape Dental Injury: Teeth and Oropharynx as per pre-operative assessment

## 2019-08-03 NOTE — Progress Notes (Signed)
Refused cpap.

## 2019-08-04 ENCOUNTER — Encounter (HOSPITAL_COMMUNITY): Payer: Self-pay | Admitting: Urology

## 2019-08-04 DIAGNOSIS — N471 Phimosis: Secondary | ICD-10-CM | POA: Diagnosis not present

## 2019-08-04 LAB — HEMOGLOBIN AND HEMATOCRIT, BLOOD
HCT: 38.4 % — ABNORMAL LOW (ref 39.0–52.0)
Hemoglobin: 12 g/dL — ABNORMAL LOW (ref 13.0–17.0)

## 2019-08-04 LAB — GLUCOSE, CAPILLARY
Glucose-Capillary: 119 mg/dL — ABNORMAL HIGH (ref 70–99)
Glucose-Capillary: 177 mg/dL — ABNORMAL HIGH (ref 70–99)

## 2019-08-04 MED ORDER — HYDROCODONE-ACETAMINOPHEN 5-325 MG PO TABS: 1.0000 | ORAL_TABLET | Freq: Four times a day (QID) | ORAL | 0 refills | Status: AC | PRN

## 2019-08-04 MED ORDER — SENNOSIDES-DOCUSATE SODIUM 8.6-50 MG PO TABS: 1.0000 | ORAL_TABLET | Freq: Two times a day (BID) | ORAL | 0 refills | Status: DC

## 2019-08-04 MED ORDER — NITROFURANTOIN MONOHYD MACRO 100 MG PO CAPS: 100.0000 mg | ORAL_CAPSULE | Freq: Two times a day (BID) | ORAL | 0 refills | Status: AC

## 2019-08-04 NOTE — Plan of Care (Signed)
  Problem: Skin Integrity: Goal: Risk for impaired skin integrity will decrease Outcome: Completed/Met   Problem: Safety: Goal: Ability to remain free from injury will improve Outcome: Completed/Met   Problem: Pain Managment: Goal: General experience of comfort will improve Outcome: Completed/Met   Problem: Elimination: Goal: Will not experience complications related to bowel motility Outcome: Completed/Met Goal: Will not experience complications related to urinary retention Outcome: Completed/Met   Problem: Coping: Goal: Level of anxiety will decrease Outcome: Completed/Met   Problem: Nutrition: Goal: Adequate nutrition will be maintained Outcome: Completed/Met   Problem: Activity: Goal: Risk for activity intolerance will decrease Outcome: Completed/Met   Problem: Clinical Measurements: Goal: Ability to maintain clinical measurements within normal limits will improve Outcome: Completed/Met Goal: Will remain free from infection Outcome: Completed/Met Goal: Diagnostic test results will improve Outcome: Completed/Met Goal: Respiratory complications will improve Outcome: Completed/Met Goal: Cardiovascular complication will be avoided Outcome: Completed/Met   Problem: Health Behavior/Discharge Planning: Goal: Ability to manage health-related needs will improve Outcome: Completed/Met

## 2019-08-04 NOTE — Discharge Summary (Signed)
Physician Discharge Summary  Patient ID: Wesley [name] MRN: [id number] DOB/AGE: Jan 04, 1956 62 y.o.  Admit date: 08/03/2019 Discharge date: 08/04/2019  Admission Diagnoses: Left renal pelvis cancer, enlarged prostate, phimosis  Discharge Diagnoses:   Left renal pelvis cancer, enlarged prostate, phimosis  Discharged Condition: good  Hospital Course: Pt underwent Left diagnostic ureteroscopy for h/o left renal pelvis cancer (no recurrence), TURP, and  Circumcision on 8/19, the day of admission, without acute complications. He was observed overnight on gentle bladder irrigation post-op. By the AM of POD 1, he is ambulatory, pain controlled on PO meds, maintaining PO nutrition, and felt to be adequate to dischage. He will continue foley at discharge and has f/u arranged for removal. Hgb >10 at discharge, pathology pending.   Consults: None  Significant Diagnostic Studies: labs: as per above  Treatments: surgery: as per above  Discharge Exam: Blood pressure 128/79, pulse 66, temperature 97.6 F (36.4 C), temperature source Oral, resp. rate 16, height 6\' 2"  (1.88 m), weight 91.6 kg, SpO2 95 %. General appearance: alert, cooperative and appears stated age Eyes: negative Nose: Nares normal. Septum midline. Mucosa normal. No drainage or sinus tenderness. Throat: lips, mucosa, and tongue normal; teeth and gums normal Neck: supple, symmetrical, trachea midline Back: symmetric, no curvature. ROM normal. No CVA tenderness. Resp: non-labored on room air Cardio: Nl rate GI: soft, non-tender; bowel sounds normal; no masses,  no organomegaly Male genitalia: normal, recent circ incision c/d/i, no hematomas. Dressign takedn down. Foley in palce wtih very light pink urine off irrigation.  Extremities: extremities normal, atraumatic, no cyanosis or edema Pulses: 2+ and symmetric Skin: Skin color, texture, turgor normal. No rashes or lesions Lymph nodes: Cervical, supraclavicular, and axillary  nodes normal. Neurologic: Grossly normal  Disposition:     Follow-up Information    Alexis Frock, MD On 08/08/2019.   Specialty: Urology Why: at 1:30 for MD visit and office catheter removal Contact information: [address] [place] [address] [phone]           Signed: Alexis Frock 08/04/2019, 7:17 AM

## 2019-08-04 NOTE — Op Note (Signed)
NAME: Wesley [name], Wesley [name] MEDICAL RECORD [id number] ACCOUNT [id number] DATE OF BIRTH:1956/02/03 FACILITY: [facility] LOCATION: [facility] PHYSICIAN:Lucretia Pendley, MD  OPERATIVE REPORT  DATE OF PROCEDURE:  08/03/2019  PREOPERATIVE DIAGNOSES: 1.  History of left renal pelvis cancer. 2.  Phimosis. 3.  Refractory lower urinary tract symptoms.  PROCEDURE: 1.  Cystoscopy, bilateral retrograde pyelograms, interpretation. 2.  Left diagnostic ureteroscopy. 3.  Circumcision. 4.  Transurethral resection of the prostate.  5.  Penile block.  ESTIMATED BLOOD LOSS:  100 mL.  COMPLICATIONS:  None.  SPECIMENS: 1.  Foreskin for discard. 2.  Prostate chips for pathology.  FINDINGS: 1.  Trilobar prostatic hypertrophy. 2.  TURP. 3.  Wide open fossa from the bladder neck to verumontanum post-TURP. 4.  Unremarkable bilateral pyelograms. 5.  No evidence of left renal pelvis tumor recurrence. 6.  Phimosis pre-circumcision. 7.  Complete resolution of phimosis post-circumcision.  DRAINS:  A 24-French 3-way Foley catheter to normal saline irrigation, 20 mL of sterile water in the balloon.  Irrigation very light pink.  INDICATIONS:  The patient is a pleasant 63 year old man with history of left renal pelvis cancer.  This was diagnosed at the [place].  He was referred for management.  He underwent staged endoscopic ablation of this approximately 6-8 months ago, and he now  presents for his first surveillance ureteroscopy.  In discussion of this, he admitted to progressive phimosis.  He is diabetic.  It is not presently limiting but making penile hygiene extremely difficult, and he wishes for concomitant circumcision.  This  was felt to be reasonable as long as no significant oncologic contraindications intraoperatively.  He also has significant progression of lower urinary tract symptoms despite maximal medical therapy and desires transurethral resection of the prostate.   Informed consent was obtained and placed  in the medical record.  PROCEDURE IN DETAIL:  The patient being identified and procedures being cystoscopy, retrogrades, left diagnostic ureteroscopy, transection of the prostate, and circumcision were confirmed.  Procedure time-out was performed.  Intravenous antibiotics were  administered.  General anesthesia was induced.  The patient was placed into a low lithotomy position.  A sterile field was created by prepping and draping his penis, perineum, and proximal thighs using iodine.  Cystourethroscopy was performed with a  21-French rigid cystoscope with offset lens.  Inspection of the anterior and posterior urethra revealed trilobar prostatic hypertrophy with kissing lobes and some ball-valving in the median lobe.  There was mild bladder trabeculation.  Ureteral orifices  were singleton bilaterally.  The left ureteral orifice was cannulated with a superficial catheter, and left retrograde pyelogram was obtained.  Left retrograde pyelogram demonstrated a single left ureter with single-system left kidney.  No filling defects or narrowing noted.  Similarly, right retrograde pyelogram was obtained.  Right retrograde pyelogram demonstrated a single right ureter with single-system right kidney.  No filling defects or narrowing noted.  A ZIPwire was then advanced to the level of the left renal pelvis and set aside as a safety wire.  An 8-French feeding  tube was placed in the urinary bladder for pressure release and semirigid ureteroscopy performed the entire length of the left ureter alongside a separate sensor working wire.  No other abnormalities were found.  The semirigid scope was then exchanged  for a 12/14 medium-length ureteral access sheath at the level of the proximal ureter using continuous fluoroscopic guidance, and flexible digital ureteroscopy was performed at the proximal left ureter and systematic inspection including all calices x3.   There was no evidence  of intraluminal papillary lesions  whatsoever.  This is quite favorable.  Access sheath was then removed under continuous vision.  No mucosal abnormalities were found.  Given the minimal manipulation of the left side, it was not felt  that stenting would be warranted.  As there was no obvious evidence of tumor recurrence, it was felt that proceeding with the other 2 procedures today, the transection of the prostate and the circumcision, would be feasible with circumcision next.  The  penis was again prepped with iodine, the distal collar marked with a marking pen which responded to an area 7 mm proximal to the corona of the glans.  The proximal collar was marked with a marking pen corresponding to the unstretched corona glans, and  both collars were developed using a scalpel.  Four snaps placed at the 12 o'clock position, and the redundant preputial tissue was incised and then released from underlying subcutaneous tissue using cautery dissection.  Additional point coagulation  current was applied, which resulted in excellent hemostasis.  The 12 o'clock position was reapproximated using a single interrupted stitch.  The frenular area was reapproximated using a U stitch, and the right and left defects were controlled using 2  separate running suture lines of dyed Vicryl, which resulted in excellent hemostasis and cosmesis.  Attention was then directed at penile block.  Ten mL of 1% lidocaine was injected along the presumed course of the dorsal penile nerve just below the  pubic symphysis.  An additional 10 mL was placed in a ring block type fashion at the base of the penis circumferentially.  As this had gone very well, attention was directed to transection of the prostate.  Cystourethroscopy views were once again  performed using a 26-French rigid cystoscope and using a medium-size resectoscope loop, bipolar energy, transition of the prostate was performed in a top-down orientation, first in the 12 o'clock position, then of the median lobe,  then the right lobe and  left lobe top down, down to the superficial stroma of the prostatic capsule.  Exquisite care was taken to avoid any undermining of the bladder neck, which did not occur, or any injury to the orifices, which did not occur.  Prostate chips were irrigated  and set aside for pathology.  The base of the resection area was fulgurated once again, resulting in excellent hemostasis.  The bladder was once again inspected.  There was no evidence of perforation.  Ureteral orifices again remained uninjured, and a  new 24-French 3-way Foley catheter was placed free to straight drain, 20 mL sterile water in the balloon.  It was placed on gentle catheter strap traction and placed on gentle normal saline irrigation with efflux very light pink.  Then the procedure was  terminated.  The patient tolerated the procedure well.  No immediate perioperative complications.  The patient was taken to postanesthesia care unit in stable condition with plan for observation overnight and discharge home likely tomorrow.  LN/NUANCE  D:08/03/2019 T:08/04/2019 JOB:[id number]

## 2019-08-04 NOTE — Plan of Care (Signed)

## 2019-08-04 NOTE — Discharge Instructions (Signed)
1 - You may have urinary urgency (bladder spasms) and bloody urine on / off with catheter in place. This is normal.  2 - You may shower immediately. All stitches are dissolvable and will disappear over 2-3 weeks. No sexual stimulation x 2 weeks.   2 - Call MD or go to ER for fever >102, severe pain / nausea / vomiting not relieved by medications, or acute change in medical status   Acetaminophen; Hydrocodone tablets or capsules What is this medicine? ACETAMINOPHEN; HYDROCODONE (a set a MEE noe fen; hye droe KOE done) is a pain reliever. It is used to treat moderate to severe pain. This medicine may be used for other purposes; ask your health care provider or pharmacist if you have questions. COMMON BRAND NAME(S): Anexsia, Bancap HC, Ceta-Plus, Co-Gesic, Comfortpak, Dolagesic, [employer], DuoCet, Hydrocet, Hydrogesic, [place], Lorcet HD, Lorcet Plus, Lortab, Margesic H, Maxidone, [place], Polygesic, [place], [place], [place], Vicodin, Vicodin ES, Vicodin HP, Charlane Ferretti What should I tell my health care provider before I take this medicine? They need to know if you have any of these conditions:  brain tumor  Crohn's disease, inflammatory bowel disease, or ulcerative colitis  drug abuse or addiction  head injury  heart or circulation problems  if you often drink alcohol  kidney disease or problems going to the bathroom  liver disease  lung disease, asthma, or breathing problems  an unusual or allergic reaction to acetaminophen, hydrocodone, other opioid analgesics, other medicines, foods, dyes, or preservatives  pregnant or trying to get pregnant  breast-feeding How should I use this medicine? Take this medicine by mouth with a glass of water. Follow the directions on the prescription label. You can take it with or without food. If it upsets your stomach, take it with food. Do not take your medicine more often than directed. A special MedGuide will be given to you by the  pharmacist with each prescription and refill. Be sure to read this information carefully each time. Talk to your pediatrician regarding the use of this medicine in children. Special care may be needed. Overdosage: If you think you have taken too much of this medicine contact a poison control center or emergency room at once. NOTE: This medicine is only for you. Do not share this medicine with others. What if I miss a dose? If you miss a dose, take it as soon as you can. If it is almost time for your next dose, take only that dose. Do not take double or extra doses. What may interact with this medicine? This medicine may interact with the following medications:  alcohol  antiviral medicines for HIV or AIDS  atropine  antihistamines for allergy, cough and cold  certain antibiotics like erythromycin, clarithromycin  certain medicines for anxiety or sleep  certain medicines for bladder problems like oxybutynin, tolterodine  certain medicines for depression like amitriptyline, fluoxetine, sertraline  certain medicines for fungal infections like ketoconazole and itraconazole  certain medicines for Parkinson's disease like benztropine, trihexyphenidyl  certain medicines for seizures like carbamazepine, phenobarbital, phenytoin, primidone  certain medicines for stomach problems like dicyclomine, hyoscyamine  certain medicines for travel sickness like scopolamine  general anesthetics like halothane, isoflurane, methoxyflurane, propofol  ipratropium  local anesthetics like lidocaine, pramoxine, tetracaine  MAOIs like Carbex, Eldepryl, Marplan, Nardil, and Parnate  medicines that relax muscles for surgery  other medicines with acetaminophen  other narcotic medicines for pain or cough  phenothiazines like chlorpromazine, mesoridazine, prochlorperazine, thioridazine  rifampin This list may not  describe all possible interactions. Give your health care provider a list of all the  medicines, herbs, non-prescription drugs, or dietary supplements you use. Also tell them if you smoke, drink alcohol, or use illegal drugs. Some items may interact with your medicine. What should I watch for while using this medicine? Tell your doctor or health care professional if your pain does not go away, if it gets worse, or if you have new or a different type of pain. You may develop tolerance to the medicine. Tolerance means that you will need a higher dose of the medicine for pain relief. Tolerance is normal and is expected if you take the medicine for a long time. Do not suddenly stop taking your medicine because you may develop a severe reaction. Your body becomes used to the medicine. This does NOT mean you are addicted. Addiction is a behavior related to getting and using a drug for a non-medical reason. If you have pain, you have a medical reason to take pain medicine. Your doctor will tell you how much medicine to take. If your doctor wants you to stop the medicine, the dose will be slowly lowered over time to avoid any side effects. There are different types of narcotic medicines (opiates). If you take more than one type at the same time or if you are taking another medicine that also causes drowsiness, you may have more side effects. Give your health care provider a list of all medicines you use. Your doctor will tell you how much medicine to take. Do not take more medicine than directed. Call emergency for help if you have problems breathing or unusual sleepiness. Do not take other medicines that contain acetaminophen with this medicine. Always read labels carefully. If you have questions, ask your doctor or pharmacist. If you take too much acetaminophen get medical help right away. Too much acetaminophen can be very dangerous and cause liver damage. Even if you do not have symptoms, it is important to get help right away. You may get drowsy or dizzy. Do not drive, use machinery, or do  anything that needs mental alertness until you know how this medicine affects you. Do not stand or sit up quickly, especially if you are an older patient. This reduces the risk of dizzy or fainting spells. Alcohol may interfere with the effect of this medicine. Avoid alcoholic drinks. The medicine will cause constipation. Try to have a bowel movement at least every 2 to 3 days. If you do not have a bowel movement for 3 days, call your doctor or health care professional. Your mouth may get dry. Chewing sugarless gum or sucking hard candy, and drinking plenty of water may help. Contact your doctor if the problem does not go away or is severe. What side effects may I notice from receiving this medicine? Side effects that you should report to your doctor or health care professional as soon as possible:  allergic reactions like skin rash, itching or hives, swelling of the face, lips, or tongue  breathing problems  confusion  redness, blistering, peeling or loosening of the skin, including inside the mouth  signs and symptoms of low blood pressure like dizziness; feeling faint or lightheaded, falls; unusually weak or tired  trouble passing urine or change in the amount of urine  yellowing of the eyes or skin Side effects that usually do not require medical attention (report to your doctor or health care professional if they continue or are bothersome):  constipation  dry mouth  nausea, vomiting  tiredness This list may not describe all possible side effects. Call your doctor for medical advice about side effects. You may report side effects to FDA at 1-800-FDA-1088. Where should I keep my medicine? Keep out of the reach of children. This medicine can be abused. Keep your medicine in a safe place to protect it from theft. Do not share this medicine with anyone. Selling or giving away this medicine is dangerous and against the law. Store at room temperature between 15 and 30 degrees C (59 and  86 degrees F). This medicine may cause harm and death if it is taken by other adults, children, or pets. Return medicine that has not been used to an official disposal site. Contact the DEA at (910)199-0981 or your city/county government to find a site. If you cannot return the medicine, flush it down the toilet. Do not use the medicine after the expiration date. NOTE: This sheet is a summary. It may not cover all possible information. If you have questions about this medicine, talk to your doctor, pharmacist, or health care provider.  2020 Elsevier/Gold Standard (2017-04-07 12:44:49)   Senna tablets or capsules What is this medicine? SENNA (SEN uh) is a laxative. This medicine is used to relieve constipation. It may also be used to empty and prepare the bowel for surgery or examination. This medicine may be used for other purposes; ask your health care provider or pharmacist if you have questions. COMMON BRAND NAME(S): Black Draught, Ex-Lax, [place], Lax-Pills, [place], [place], [place], [place], [place], [place], [place], [place], Senokot, Senokot Extra Strength, Senokot Xtra, SenoSol, SenoSol-X, Uni-Cenna What should I tell my health care provider before I take this medicine? They need to know if you have any of these conditions:  blockage in your bowel  inflammatory bowel disease  stomach or intestine problems  sudden change in bowel habits lasting more than 2 weeks  vomiting  an unusual or allergic reaction to senna, other medicines, foods, dyes, or preservatives  pregnant or trying to get pregnant  breast-feeding How should I use this medicine? Take this medicine by mouth with a full glass of water. Follow the directions on the product label. Take exactly as directed. Do not take your medicine more often than directed. Talk to your pediatrician regarding the use of this medicine in children. While this medicine may be prescribed for children as young as 2 years for  selected conditions, precautions do apply. Overdosage: If you think you have taken too much of this medicine contact a poison control center or emergency room at once. NOTE: This medicine is only for you. Do not share this medicine with others. What if I miss a dose? If you miss a dose, take it as soon as you can. If it is almost time for your next dose, take only that dose. Do not take double or extra doses. What may interact with this medicine? Interactions are not expected. Do not use any other laxative products without talking to your healthcare professional. This list may not describe all possible interactions. Give your health care provider a list of all the medicines, herbs, non-prescription drugs, or dietary supplements you use. Also tell them if you smoke, drink alcohol, or use illegal drugs. Some items may interact with your medicine. What should I watch for while using this medicine? Do not use for more than 1 week unless otherwise directed by your doctor or health care professional. Stop using this medicine and contact your doctor or health care professional  if you have rectal bleeding or do not have a bowel movement after use. These could be signs of a more serious condition. What side effects may I notice from receiving this medicine? Side effects that you should report to your doctor or health care professional as soon as possible:  allergic reactions like skin rash, itching or hives, swelling of the face, lips, or tongue  bloody or black, tarry stools  breathing problems  muscle weakness  stomach pain  unusually weak or tired  vomiting Side effects that usually do not require medical attention (report to your doctor or health care professional if they continue or are bothersome):  discolored urine (red or orange)  gas  upset stomach This list may not describe all possible side effects. Call your doctor for medical advice about side effects. You may report side  effects to FDA at 1-800-FDA-1088. Where should I keep my medicine? Keep out of the reach of children. Store at room temperature between 15 and 30 degrees C (59 and 86 degrees F). Keep container tightly closed. Throw away any unused medicine after the expiration date. NOTE: This sheet is a summary. It may not cover all possible information. If you have questions about this medicine, talk to your doctor, pharmacist, or health care provider.  2020 Elsevier/Gold Standard (2018-01-22 09:09:21)

## 2019-08-04 NOTE — Progress Notes (Signed)
Pt discharged home today per Dr. Tresa Moore. Pt's IV site D/C'd and WDL. Pt's VSS. Pt provided with home medication list, discharge instructions and prescriptions. Verbalized understanding. Pt and wife instructed on how to exchange standard drainage bag for leg bag and proper foley care. Verbalized understanding. Pt left floor via WC in stable condition accompanied by NT.

## 2019-08-30 ENCOUNTER — Emergency Department (HOSPITAL_COMMUNITY): Payer: No Typology Code available for payment source

## 2019-08-30 ENCOUNTER — Encounter (HOSPITAL_COMMUNITY): Payer: Self-pay

## 2019-08-30 ENCOUNTER — Observation Stay (HOSPITAL_COMMUNITY)
Admission: EM | Admit: 2019-08-30 | Discharge: 2019-09-01 | Disposition: A | Discharge status: Home / Self Care | Payer: No Typology Code available for payment source | Attending: Emergency Medicine | Admitting: Emergency Medicine

## 2019-08-30 DIAGNOSIS — Z950 Presence of cardiac pacemaker: Secondary | ICD-10-CM | POA: Diagnosis not present

## 2019-08-30 DIAGNOSIS — Z20828 Contact with and (suspected) exposure to other viral communicable diseases: Secondary | ICD-10-CM | POA: Diagnosis not present

## 2019-08-30 DIAGNOSIS — F419 Anxiety disorder, unspecified: Secondary | ICD-10-CM | POA: Diagnosis not present

## 2019-08-30 DIAGNOSIS — R51 Headache: Secondary | ICD-10-CM | POA: Diagnosis not present

## 2019-08-30 DIAGNOSIS — G250 Essential tremor: Secondary | ICD-10-CM | POA: Diagnosis not present

## 2019-08-30 DIAGNOSIS — Z79899 Other long term (current) drug therapy: Secondary | ICD-10-CM | POA: Diagnosis not present

## 2019-08-30 DIAGNOSIS — I10 Essential (primary) hypertension: Secondary | ICD-10-CM | POA: Diagnosis present

## 2019-08-30 DIAGNOSIS — J449 Chronic obstructive pulmonary disease, unspecified: Secondary | ICD-10-CM | POA: Diagnosis present

## 2019-08-30 DIAGNOSIS — G4733 Obstructive sleep apnea (adult) (pediatric): Secondary | ICD-10-CM | POA: Diagnosis present

## 2019-08-30 DIAGNOSIS — E119 Type 2 diabetes mellitus without complications: Secondary | ICD-10-CM | POA: Diagnosis present

## 2019-08-30 DIAGNOSIS — N390 Urinary tract infection, site not specified: Secondary | ICD-10-CM | POA: Diagnosis not present

## 2019-08-30 DIAGNOSIS — I69359 Hemiplegia and hemiparesis following cerebral infarction affecting unspecified side: Secondary | ICD-10-CM | POA: Insufficient documentation

## 2019-08-30 DIAGNOSIS — R2990 Unspecified symptoms and signs involving the nervous system: Secondary | ICD-10-CM | POA: Diagnosis not present

## 2019-08-30 DIAGNOSIS — R519 Headache, unspecified: Secondary | ICD-10-CM | POA: Diagnosis present

## 2019-08-30 DIAGNOSIS — Z794 Long term (current) use of insulin: Secondary | ICD-10-CM | POA: Insufficient documentation

## 2019-08-30 DIAGNOSIS — F329 Major depressive disorder, single episode, unspecified: Secondary | ICD-10-CM | POA: Diagnosis present

## 2019-08-30 DIAGNOSIS — F32A Depression, unspecified: Secondary | ICD-10-CM | POA: Diagnosis present

## 2019-08-30 DIAGNOSIS — E1169 Type 2 diabetes mellitus with other specified complication: Secondary | ICD-10-CM | POA: Diagnosis present

## 2019-08-30 LAB — URINALYSIS, ROUTINE W REFLEX MICROSCOPIC
Bilirubin Urine: NEGATIVE
Glucose, UA: 150 mg/dL — AB
Ketones, ur: NEGATIVE mg/dL
Nitrite: NEGATIVE
Protein, ur: 30 mg/dL — AB
RBC / HPF: >50 RBC/hpf — ABNORMAL HIGH (ref 0–5)
Specific Gravity, Urine: 1.021 (ref 1.005–1.030)
pH: 6 (ref 5.0–8.0)

## 2019-08-30 LAB — RAPID URINE DRUG SCREEN, HOSP PERFORMED
Amphetamines: NOT DETECTED
Barbiturates: NOT DETECTED
Benzodiazepines: NOT DETECTED
Cocaine: NOT DETECTED
Opiates: NOT DETECTED
Tetrahydrocannabinol: POSITIVE — AB

## 2019-08-30 LAB — DIFFERENTIAL
Abs Immature Granulocytes: 0.01 10*3/uL (ref 0.00–0.07)
Basophils Absolute: 0 10*3/uL (ref 0.0–0.1)
Basophils Relative: 1 %
Eosinophils Absolute: 0.2 10*3/uL (ref 0.0–0.5)
Eosinophils Relative: 3 %
Immature Granulocytes: 0 %
Lymphocytes Relative: 38 %
Lymphs Abs: 2.3 10*3/uL (ref 0.7–4.0)
Monocytes Absolute: 0.3 10*3/uL (ref 0.1–1.0)
Monocytes Relative: 6 %
Neutro Abs: 3.3 10*3/uL (ref 1.7–7.7)
Neutrophils Relative %: 52 %

## 2019-08-30 LAB — CBC
HCT: 40.5 % (ref 39.0–52.0)
HCT: 38.2 % — ABNORMAL LOW (ref 39.0–52.0)
Hemoglobin: 13.1 g/dL (ref 13.0–17.0)
Hemoglobin: 12.6 g/dL — ABNORMAL LOW (ref 13.0–17.0)
MCH: 27.2 pg (ref 26.0–34.0)
MCH: 27.9 pg (ref 26.0–34.0)
MCHC: 32.3 g/dL (ref 30.0–36.0)
MCHC: 33 g/dL (ref 30.0–36.0)
MCV: 84.2 fL (ref 80.0–100.0)
MCV: 84.7 fL (ref 80.0–100.0)
Platelets: 231 10*3/uL (ref 150–400)
Platelets: 213 10*3/uL (ref 150–400)
RBC: 4.81 MIL/uL (ref 4.22–5.81)
RBC: 4.51 MIL/uL (ref 4.22–5.81)
RDW: 14.4 % (ref 11.5–15.5)
RDW: 14.3 % (ref 11.5–15.5)
WBC: 6.2 10*3/uL (ref 4.0–10.5)
WBC: 6.6 10*3/uL (ref 4.0–10.5)
nRBC: 0 % (ref 0.0–0.2)
nRBC: 0 % (ref 0.0–0.2)

## 2019-08-30 LAB — COMPREHENSIVE METABOLIC PANEL
ALT: 28 U/L (ref 0–44)
AST: 16 U/L (ref 15–41)
Albumin: 4 g/dL (ref 3.5–5.0)
Alkaline Phosphatase: 143 U/L — ABNORMAL HIGH (ref 38–126)
Anion gap: 12 (ref 5–15)
BUN: 15 mg/dL (ref 8–23)
CO2: 22 mmol/L (ref 22–32)
Calcium: 9.5 mg/dL (ref 8.9–10.3)
Chloride: 100 mmol/L (ref 98–111)
Creatinine, Ser: 0.8 mg/dL (ref 0.61–1.24)
GFR calc Af Amer: >60 mL/min (ref >60)
GFR calc non Af Amer: >60 mL/min (ref >60)
Glucose, Bld: 144 mg/dL — ABNORMAL HIGH (ref 70–99)
Potassium: 4.2 mmol/L (ref 3.5–5.1)
Sodium: 134 mmol/L — ABNORMAL LOW (ref 135–145)
Total Bilirubin: 0.4 mg/dL (ref 0.3–1.2)
Total Protein: 7.1 g/dL (ref 6.5–8.1)

## 2019-08-30 LAB — SARS CORONAVIRUS 2 BY RT PCR (HOSPITAL ORDER, PERFORMED IN ~~LOC~~ HOSPITAL LAB): SARS Coronavirus 2: NEGATIVE

## 2019-08-30 LAB — CREATININE, SERUM
Creatinine, Ser: 1.29 mg/dL — ABNORMAL HIGH (ref 0.61–1.24)
GFR calc Af Amer: >60 mL/min (ref >60)
GFR calc non Af Amer: 59 mL/min — ABNORMAL LOW (ref >60)

## 2019-08-30 MED ORDER — IOHEXOL 350 MG/ML SOLN
75.0000 mL | Freq: Once | INTRAVENOUS | Status: AC | PRN
  Administered 2019-08-30: 75 mL via INTRAVENOUS

## 2019-08-30 MED ORDER — GABAPENTIN 300 MG PO CAPS
300.0000 mg | ORAL_CAPSULE | Freq: Three times a day (TID) | ORAL | Status: DC
  Administered 2019-08-31 – 2019-09-01 (×4): 300 mg via ORAL
  Filled 2019-08-30 (×4): qty 1

## 2019-08-30 MED ORDER — FLUTICASONE PROPIONATE 50 MCG/ACT NA SUSP
2.0000 | Freq: Every day | NASAL | Status: DC | PRN
  Filled 2019-08-30: qty 16

## 2019-08-30 MED ORDER — DEXAMETHASONE SODIUM PHOSPHATE 10 MG/ML IJ SOLN
10.0000 mg | Freq: Once | INTRAMUSCULAR | Status: AC
  Administered 2019-08-30: 10 mg via INTRAVENOUS
  Filled 2019-08-30: qty 1

## 2019-08-30 MED ORDER — SODIUM CHLORIDE 0.9 % IV SOLN
1.0000 g | Freq: Once | INTRAVENOUS | Status: AC
  Administered 2019-08-30: 19:00:00 1 g via INTRAVENOUS
  Filled 2019-08-30: qty 10

## 2019-08-30 MED ORDER — LURASIDONE HCL 20 MG PO TABS
20.0000 mg | ORAL_TABLET | Freq: Every day | ORAL | Status: DC
  Administered 2019-08-31: 20 mg via ORAL
  Filled 2019-08-30 (×3): qty 1

## 2019-08-30 MED ORDER — CYCLOBENZAPRINE HCL 10 MG PO TABS: 10.0000 mg | ORAL_TABLET | Freq: Every evening | ORAL | Status: DC | PRN

## 2019-08-30 MED ORDER — SODIUM CHLORIDE 0.9 % IV SOLN
INTRAVENOUS | Status: DC
  Administered 2019-08-30 – 2019-09-01 (×2): via INTRAVENOUS

## 2019-08-30 MED ORDER — SODIUM CHLORIDE 0.9 % IV BOLUS
1000.0000 mL | Freq: Once | INTRAVENOUS | Status: AC
  Administered 2019-08-30: 12:00:00 1000 mL via INTRAVENOUS

## 2019-08-30 MED ORDER — PANTOPRAZOLE SODIUM 40 MG PO TBEC
40.0000 mg | DELAYED_RELEASE_TABLET | Freq: Two times a day (BID) | ORAL | Status: DC
  Administered 2019-08-31 – 2019-09-01 (×3): 40 mg via ORAL
  Filled 2019-08-30 (×3): qty 1

## 2019-08-30 MED ORDER — SENNOSIDES-DOCUSATE SODIUM 8.6-50 MG PO TABS: 1.0000 | ORAL_TABLET | Freq: Every evening | ORAL | Status: DC | PRN

## 2019-08-30 MED ORDER — HYDROCODONE-ACETAMINOPHEN 5-325 MG PO TABS
1.0000 | ORAL_TABLET | Freq: Four times a day (QID) | ORAL | Status: DC | PRN
  Administered 2019-08-31: 1 via ORAL
  Filled 2019-08-30: qty 1

## 2019-08-30 MED ORDER — SODIUM CHLORIDE 0.9 % IV SOLN
1.0000 g | INTRAVENOUS | Status: DC
  Administered 2019-08-31: 19:00:00 1 g via INTRAVENOUS
  Filled 2019-08-30: qty 10

## 2019-08-30 MED ORDER — ALBUTEROL SULFATE (2.5 MG/3ML) 0.083% IN NEBU: 2.5000 mg | INHALATION_SOLUTION | Freq: Two times a day (BID) | RESPIRATORY_TRACT | Status: DC | PRN

## 2019-08-30 MED ORDER — VALPROATE SODIUM 500 MG/5ML IV SOLN
1000.0000 mg | Freq: Once | INTRAVENOUS | Status: DC
  Filled 2019-08-30 (×2): qty 10

## 2019-08-30 MED ORDER — STROKE: EARLY STAGES OF RECOVERY BOOK
Freq: Once | Status: AC
  Administered 2019-08-30: 21:00:00
  Filled 2019-08-30: qty 1

## 2019-08-30 MED ORDER — ACETAMINOPHEN 325 MG PO TABS: 650.0000 mg | ORAL_TABLET | Freq: Four times a day (QID) | ORAL | Status: DC | PRN

## 2019-08-30 MED ORDER — TAMSULOSIN HCL 0.4 MG PO CAPS
0.4000 mg | ORAL_CAPSULE | Freq: Every day | ORAL | Status: DC
  Administered 2019-08-30 – 2019-08-31 (×2): 0.4 mg via ORAL
  Filled 2019-08-30 (×2): qty 1

## 2019-08-30 MED ORDER — BRIMONIDINE TARTRATE 0.2 % OP SOLN
1.0000 [drp] | Freq: Every day | OPHTHALMIC | Status: DC
  Administered 2019-08-31: 22:00:00 1 [drp] via OPHTHALMIC
  Filled 2019-08-30: qty 5

## 2019-08-30 MED ORDER — ATORVASTATIN CALCIUM 40 MG PO TABS
40.0000 mg | ORAL_TABLET | Freq: Every day | ORAL | Status: DC
  Administered 2019-08-30 – 2019-08-31 (×2): 40 mg via ORAL
  Filled 2019-08-30 (×2): qty 1

## 2019-08-30 MED ORDER — MIRTAZAPINE 15 MG PO TABS
7.5000 mg | ORAL_TABLET | Freq: Every day | ORAL | Status: DC
  Administered 2019-08-31: 22:00:00 7.5 mg via ORAL
  Filled 2019-08-30 (×3): qty 1

## 2019-08-30 MED ORDER — NICOTINE 14 MG/24HR TD PT24
14.0000 mg | MEDICATED_PATCH | TRANSDERMAL | Status: DC
  Administered 2019-09-01: 10:00:00 14 mg via TRANSDERMAL
  Filled 2019-08-30: qty 1

## 2019-08-30 MED ORDER — DILTIAZEM HCL ER COATED BEADS 180 MG PO CP24
180.0000 mg | ORAL_CAPSULE | Freq: Every day | ORAL | Status: DC
  Administered 2019-08-31: 22:00:00 180 mg via ORAL
  Filled 2019-08-30 (×3): qty 1

## 2019-08-30 MED ORDER — SENNOSIDES-DOCUSATE SODIUM 8.6-50 MG PO TABS
1.0000 | ORAL_TABLET | Freq: Two times a day (BID) | ORAL | Status: DC
  Administered 2019-08-30 – 2019-09-01 (×4): 1 via ORAL
  Filled 2019-08-30 (×4): qty 1

## 2019-08-30 MED ORDER — ALBUTEROL SULFATE HFA 108 (90 BASE) MCG/ACT IN AERS: 2.0000 | INHALATION_SPRAY | Freq: Four times a day (QID) | RESPIRATORY_TRACT | Status: DC | PRN

## 2019-08-30 MED ORDER — VALPROATE SODIUM 500 MG/5ML IV SOLN
1000.0000 mg | INTRAVENOUS | Status: AC
  Administered 2019-08-30: 15:00:00 1000 mg via INTRAVENOUS
  Filled 2019-08-30: qty 10

## 2019-08-30 MED ORDER — SENNA 8.6 MG PO TABS: 1.0000 | ORAL_TABLET | Freq: Every day | ORAL | Status: DC

## 2019-08-30 MED ORDER — MIRABEGRON ER 25 MG PO TB24
25.0000 mg | ORAL_TABLET | Freq: Every day | ORAL | Status: DC
  Administered 2019-08-31: 25 mg via ORAL
  Filled 2019-08-30 (×3): qty 1

## 2019-08-30 MED ORDER — ENOXAPARIN SODIUM 40 MG/0.4ML ~~LOC~~ SOLN
40.0000 mg | SUBCUTANEOUS | Status: DC
  Administered 2019-09-01: 10:00:00 40 mg via SUBCUTANEOUS
  Filled 2019-08-30 (×2): qty 0.4

## 2019-08-30 MED ORDER — ACETAMINOPHEN 160 MG/5ML PO SOLN: 650.0000 mg | ORAL | Status: DC | PRN

## 2019-08-30 MED ORDER — LEVETIRACETAM 500 MG PO TABS
500.0000 mg | ORAL_TABLET | Freq: Every morning | ORAL | Status: DC
  Administered 2019-08-31 – 2019-09-01 (×2): 500 mg via ORAL
  Filled 2019-08-30 (×2): qty 1

## 2019-08-30 MED ORDER — FLUTICASONE PROPIONATE HFA 44 MCG/ACT IN AERO
2.0000 | INHALATION_SPRAY | Freq: Two times a day (BID) | RESPIRATORY_TRACT | Status: DC
  Administered 2019-08-31 – 2019-09-01 (×2): 2 via RESPIRATORY_TRACT
  Filled 2019-08-30: qty 10.6

## 2019-08-30 MED ORDER — PROCHLORPERAZINE EDISYLATE 10 MG/2ML IJ SOLN
10.0000 mg | Freq: Once | INTRAMUSCULAR | Status: AC
  Administered 2019-08-30: 10 mg via INTRAVENOUS
  Filled 2019-08-30: qty 2

## 2019-08-30 MED ORDER — INSULIN ASPART PROT & ASPART (70-30 MIX) 100 UNIT/ML ~~LOC~~ SUSP
15.0000 [IU] | Freq: Every day | SUBCUTANEOUS | Status: DC
  Administered 2019-08-31: 15 [IU] via SUBCUTANEOUS
  Filled 2019-08-30: qty 10

## 2019-08-30 MED ORDER — BISACODYL 10 MG RE SUPP: 10.0000 mg | RECTAL | Status: DC | PRN

## 2019-08-30 MED ORDER — POLYVINYL ALCOHOL 1.4 % OP SOLN
1.0000 [drp] | Freq: Two times a day (BID) | OPHTHALMIC | Status: DC
  Administered 2019-08-31 – 2019-09-01 (×2): 1 [drp] via OPHTHALMIC
  Filled 2019-08-30: qty 15

## 2019-08-30 MED ORDER — ACETAMINOPHEN 325 MG PO TABS: 650.0000 mg | ORAL_TABLET | ORAL | Status: DC | PRN

## 2019-08-30 MED ORDER — INSULIN ASPART PROT & ASPART (70-30 MIX) 100 UNIT/ML ~~LOC~~ SUSP
25.0000 [IU] | Freq: Every day | SUBCUTANEOUS | Status: DC
  Administered 2019-09-01: 10:00:00 25 [IU] via SUBCUTANEOUS
  Filled 2019-08-30: qty 10

## 2019-08-30 MED ORDER — KETOROLAC TROMETHAMINE 15 MG/ML IJ SOLN
30.0000 mg | Freq: Once | INTRAMUSCULAR | Status: AC
  Administered 2019-08-30: 15:00:00 30 mg via INTRAVENOUS
  Filled 2019-08-30: qty 2

## 2019-08-30 MED ORDER — INSULIN GLARGINE 100 UNIT/ML ~~LOC~~ SOLN: 33.0000 [IU] | Freq: Every day | SUBCUTANEOUS | Status: DC

## 2019-08-30 MED ORDER — LEVETIRACETAM 500 MG PO TABS
1000.0000 mg | ORAL_TABLET | Freq: Every day | ORAL | Status: DC
  Administered 2019-08-30 – 2019-08-31 (×2): 1000 mg via ORAL
  Filled 2019-08-30 (×2): qty 2

## 2019-08-30 MED ORDER — ACETAMINOPHEN 650 MG RE SUPP: 650.0000 mg | RECTAL | Status: DC | PRN

## 2019-08-30 MED ORDER — DIPHENHYDRAMINE HCL 50 MG/ML IJ SOLN
25.0000 mg | Freq: Once | INTRAMUSCULAR | Status: AC
  Administered 2019-08-30: 12:00:00 25 mg via INTRAVENOUS
  Filled 2019-08-30: qty 1

## 2019-08-30 MED ORDER — VENLAFAXINE HCL ER 75 MG PO CP24
150.0000 mg | ORAL_CAPSULE | Freq: Every day | ORAL | Status: DC
  Administered 2019-08-31: 150 mg via ORAL
  Filled 2019-08-30: qty 1
  Filled 2019-08-30: qty 2
  Filled 2019-08-30: qty 1

## 2019-08-30 MED ORDER — MOMETASONE FUROATE 220 MCG/INH IN AEPB: 2.0000 | INHALATION_SPRAY | Freq: Every day | RESPIRATORY_TRACT | Status: DC

## 2019-08-30 MED ORDER — LATANOPROST 0.005 % OP SOLN
1.0000 [drp] | Freq: Every day | OPHTHALMIC | Status: DC
  Administered 2019-08-31: 22:00:00 1 [drp] via OPHTHALMIC
  Filled 2019-08-30: qty 2.5

## 2019-08-30 NOTE — Consult Note (Addendum)
Neurology Consultation  Reason for Consult: Headache Referring Physician: [place]   History is obtained from: Patient  HPI: Wesley [name] is a 63 y.o. male with significant past medical history.  Patient has essential tremor, stroke back in 2017 that affected the left side, sleep apnea, seizure, status post pacemaker implantation, left-sided weakness, hypertension, hyperlipidemia, diabetes, dementia per wife, CAD, COPD, aortic aneurysm.  Patient does have a history of migraine headaches.  He states that her normal migraine headache starts with floaters and at that time he starts to have throbbing, temporal headache.  He uses Tylenol to relieve the pain.  It can last from 2 days up to 3 days.  He has a PCP but has not brought this up to him.  He has never taken abortive drug and or prophylactic drug for his migraines.  States he had his last migraine last week but since then has not had a migraine for some time.    Patient states that on Sunday he noted he was having floaters and eye discomfort.  He did not have a headache.  Today he continued to have photophobia but also felt as though his head was pounding.  He went to see his and ophthalmologist however he cannot get an exam secondary to light sensitivity.  Patient came to the ED for further evaluation.  Since then he has received migraine cocktail which has significantly helped his headache.  The only difference between his migraine headache and the headache he has today is this seems more severe and the light sensitivity is more severe.  Patient denies any other neurologic symptoms.   ED course-Benadryl and Compazine, CT head, CMP, urinalysis, CBC   Chart review -back in 2017 patient did have a stroke affecting the left side however he was unable to obtain a MRI.     LKW: 08/28/2019 unknown exact time tpa given?: no, out of window Premorbid modified Rankin scale (mRS): 0 NIH stroke scale is 0   ROS: A 14 point ROS was performed and  is negative except as noted in the HPI.   Past Medical History:  Diagnosis Date  . Aneurysm of infrarenal abdominal aorta ([place])    CT 11-23-2018 , 3.2cm  . Anxiety   . Benign localized prostatic hyperplasia with lower urinary tract symptoms (LUTS)   . Cancer [facility])    kidney cancer  . 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 CAP (community acquired pneumonia) 11/23/2018   per pt had follow up by pcp at [facility] with CXR done after christmas  . Chronic insomnia   . COPD with emphysema ([place])   . Coronary artery disease    followed by cardiologist @ [facility]---  per cardiac cath 07-11-2015 mild plaquing of the CFx and RCA, normal LVF [facility] [place] copy in epic)  . Dementia ([place])    "some per wife"   . Depression   . Diabetes mellitus without complication ([place])    type 2   . Diverticulosis of colon   . GERD (gastroesophageal reflux disease)   . Hematuria   . 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 History of diverticulitis of colon   . History of gout   . History of recurrent UTIs   . History of syncope    multiple episodes  . History of treatment for tuberculosis    per pt approx. 2001  .  History of urinary retention   . Hyperlipidemia   . Hypertension   . Incomplete emptying of bladder   . Left-sided weakness 08/27/2016   CVA residual  . Myocardial infarction ([place]) 2015  . OA (osteoarthritis)    knees, feet, wrists, back  . Pacemaker   . Polysubstance abuse ([place])    per pt from 2001 to 2016 has had couple of relapses since--  12-31-2018 per pt last relapse with cocaine approx. Oct 2019  . Renal mass, left    pelvic  . Retinal vein occlusion 01/2016   right eye, secondary to hypertension  . S/P placement of cardiac pacemaker 12/11/2014   St Jude dual chamber (followed by [employer])  . Seizures ([place])    wife reported last one  2  WEEKS AGO AS OF 07-29-2019  . Sepsis ([place]) 02/2019   WENT HOME ON IV MEDS FOR 2  1/2 WEEKS  . Shortness of breath    WITH ACTIVITY  . Sleep apnea    cpap USES CPAP FEW TIMES WEEK  . SSS (sick sinus syndrome) ([place])    treatment pacemaker placement  . Stroke [facility]) 2017 OR 2018   had therapy  slow movement now ( 02/01/2019)  . Tremor, essential 03/31/2016  . Tuberculosis    1990s   . Wears glasses   . Wears hearing aid in both ears     Family History  Problem Relation Age of Onset  . Cancer Mother   . Cancer Father   . Cancer Sister        lung  . Glaucoma Brother   . Cancer Brother   . Colon cancer Neg Hx   . Dementia Neg Hx   . Tremor Neg Hx     Social History:   reports that he has been smoking cigarettes. He has smoked for the past 40.00 years. He has never used smokeless tobacco. He reports previous alcohol use. He reports previous drug use.  Medications No current facility-administered medications for this encounter.   Current Outpatient Medications:  .  acetaminophen (TYLENOL) 325 MG tablet, Take 650 mg by mouth every 6 (six) hours as needed for mild pain or headache., Disp: , Rfl:  .  albuterol (PROVENTIL HFA;VENTOLIN HFA) 108 (90 Base) MCG/ACT inhaler, Inhale 2 puffs into the lungs every 6 (six) hours as needed for wheezing or shortness of breath. (Patient taking differently: Inhale 2 puffs into the lungs every 6 (six) hours as needed for wheezing or shortness of breath. ), Disp: 1 Inhaler, Rfl: 2 .  albuterol (PROVENTIL) (2.5 MG/3ML) 0.083% nebulizer solution, Take 2.5 mg by nebulization 2 (two) times daily as needed for wheezing or shortness of breath. , Disp: , Rfl:  .  atorvastatin (LIPITOR) 40 MG tablet, Take 40 mg by mouth at bedtime. , Disp: , Rfl:  .  bisacodyl (DULCOLAX) 10 MG suppository, Place 10 mg rectally as needed for moderate constipation., Disp: , Rfl:  .  Blood Glucose Monitoring Suppl (ACCU-CHEK AVIVA PLUS) w/Device KIT, 1 Device by Does not apply  route 4 (four) times daily., Disp: 1 kit, Rfl: 0 .  brimonidine (ALPHAGAN) 0.2 % ophthalmic solution, Place 1 drop into both eyes at bedtime., Disp: , Rfl:  .  carboxymethylcellulose (REFRESH PLUS) 0.5 % SOLN, Place 1 drop into both eyes 2 (two) times daily. , Disp: , Rfl:  .  cetirizine (ZYRTEC) 10 MG tablet, Take 1 tablet (10 mg total) by mouth daily. (Patient taking differently: Take 10 mg by mouth  daily as needed for allergies. ), Disp: 30 tablet, Rfl: 1 .  Cholecalciferol (VITAMIN D3) 50 MCG (2000 UT) TABS, Take 2,000 Units by mouth daily., Disp: , Rfl:  .  cyclobenzaprine (FLEXERIL) 10 MG tablet, Take 10 mg by mouth at bedtime as needed for muscle spasms., Disp: , Rfl:  .  diclofenac sodium (VOLTAREN) 1 % GEL, Apply 4 g topically 4 (four) times daily as needed (pain.). , Disp: , Rfl:  .  diltiazem (DILACOR XR) 180 MG 24 hr capsule, Take 180 mg by mouth at bedtime. , Disp: , Rfl:  .  ENSURE PLUS (ENSURE PLUS) LIQD, Take 237 mLs by mouth See admin instructions. Drinks 1 bottle one time a day but if he hasn't eaten much through the day may drink another one., Disp: , Rfl:  .  finasteride (PROSCAR) 5 MG tablet, Take 5 mg by mouth daily. , Disp: , Rfl:  .  fluticasone (FLONASE) 50 MCG/ACT nasal spray, Place 2 sprays into both nostrils daily as needed for allergies. , Disp: , Rfl:  .  gabapentin (NEURONTIN) 300 MG capsule, Take 300 mg by mouth 3 (three) times daily., Disp: , Rfl:  .  glucose blood (ACCU-CHEK AVIVA) test strip, Use as instructed, Disp: 100 each, Rfl: 12 .  hydrALAZINE (APRESOLINE) 25 MG tablet, Take 1 tablet (25 mg total) by mouth every 8 (eight) hours., Disp: 90 tablet, Rfl: 0 .  hydrochlorothiazide (HYDRODIURIL) 25 MG tablet, Take 1 tablet (25 mg total) by mouth daily., Disp: 30 tablet, Rfl: 0 .  HYDROcodone-acetaminophen (NORCO/VICODIN) 5-325 MG tablet, Take 1-2 tablets by mouth every 6 (six) hours as needed for moderate pain or severe pain. Post-operatively, Disp: 20 tablet, Rfl:  0 .  insulin aspart protamine - aspart (NOVOLOG 70/30 MIX) (70-30) 100 UNIT/ML FlexPen, Inject 15-25 Units into the skin 2 (two) times daily. Inject 25 units am and 15 units pm, Disp: , Rfl:  .  insulin glargine (LANTUS) 100 UNIT/ML injection, Inject 0.33 mLs (33 Units total) into the skin at bedtime. (Patient not taking: Reported on 03/21/2019), Disp: , Rfl:  .  latanoprost (XALATAN) 0.005 % ophthalmic solution, Place 1 drop into both eyes at bedtime. , Disp: , Rfl:  .  levETIRAcetam (KEPPRA) 500 MG tablet, Take 500-1,000 mg by mouth 2 (two) times daily. 574m in the am 10026mat night, Disp: , Rfl:  .  lurasidone (LATUDA) 20 MG TABS tablet, Take 20 mg by mouth at bedtime. , Disp: , Rfl:  .  metFORMIN (GLUCOPHAGE) 1000 MG tablet, Take 1,000 mg by mouth 2 (two) times daily., Disp: , Rfl:  .  mirabegron ER (MYRBETRIQ) 25 MG TB24 tablet, Take 25 mg by mouth at bedtime. , Disp: , Rfl:  .  mirtazapine (REMERON) 15 MG tablet, Take 7.5 mg by mouth at bedtime. , Disp: , Rfl:  .  mometasone (ASMANEX, 120 METERED DOSES,) 220 MCG/INH inhaler, Inhale 2 puffs into the lungs at bedtime., Disp: , Rfl:  .  nicotine (NICODERM CQ - DOSED IN MG/24 HOURS) 14 mg/24hr patch, Place 14 mg onto the skin every other day., Disp: , Rfl:  .  nicotine (NICODERM CQ - DOSED IN MG/24 HR) 7 mg/24hr patch, Place 1 patch (7 mg total) onto the skin daily., Disp: 28 patch, Rfl: 0 .  nitrofurantoin, macrocrystal-monohydrate, (MACROBID) 100 MG capsule, Take 100 mg by mouth 2 (two) times daily., Disp: , Rfl:  .  pantoprazole (PROTONIX) 40 MG tablet, Take 1 tablet (40 mg total) by mouth 2 (two)  times daily before a meal., Disp: 60 tablet, Rfl: 0 .  senna (SENOKOT) 8.6 MG TABS tablet, Take 1 tablet (8.6 mg total) by mouth 2 (two) times daily. (Patient taking differently: Take 1 tablet by mouth daily. ), Disp: 120 each, Rfl: 0 .  senna-docusate (SENOKOT-S) 8.6-50 MG tablet, Take 1 tablet by mouth 2 (two) times daily. While taking strong pain  meds to prevent constipation., Disp: 20 tablet, Rfl: 0 .  sildenafil (VIAGRA) 100 MG tablet, Take 100 mg by mouth daily as needed for erectile dysfunction., Disp: , Rfl:  .  tamsulosin (FLOMAX) 0.4 MG CAPS capsule, Take 1 capsule (0.4 mg total) by mouth daily after supper. (Patient taking differently: Take 0.4 mg by mouth daily after supper. ), Disp: 30 capsule, Rfl: 0 .  Tiotropium Bromide-Olodaterol (STIOLTO RESPIMAT) 2.5-2.5 MCG/ACT AERS, Inhale 2 puffs into the lungs at bedtime. , Disp: , Rfl:  .  venlafaxine XR (EFFEXOR-XR) 150 MG 24 hr capsule, Take 150 mg by mouth at bedtime. , Disp: , Rfl:  .  vitamin B-12 (CYANOCOBALAMIN) 500 MCG tablet, Take 500 mcg by mouth daily., Disp: , Rfl:    Exam: Current vital signs: BP (!) 130/96   Pulse 60   Temp 97.7 F (36.5 C)   Resp 17   Ht '6\' 2"'  (1.88 m)   Wt 93 kg   SpO2 98%   BMI 26.32 kg/m  Vital signs in last 24 hours: Temp:  [97.7 F (36.5 C)] 97.7 F (36.5 C) (09/15 1122) Pulse Rate:  [60-67] 60 (09/15 1145) Resp:  [15-17] 17 (09/15 1145) BP: (126-132)/(90-96) 130/96 (09/15 1145) SpO2:  [98 %] 98 % (09/15 1145) Weight:  [93 kg] 93 kg (09/15 1127)  Physical Exam  Constitutional: Appears well-developed and well-nourished.  Psych: Affect appropriate to situation Eyes: No scleral injection HENT: No OP obstrucion Head: Normocephalic.  Cardiovascular: Normal rate and regular rhythm.  Respiratory: Effort normal, non-labored breathing GI: Soft.  No distension. There is no tenderness.  Skin: WDI  Neuro: Mental Status: Patient is awake, alert, oriented to person, place, month, year, and situation. Patient is able to give a clear and coherent history. No signs of aphasia or neglect Cranial Nerves: II: Very difficult to assess, however blinks to threat bilaterally.  I can only assess this very briefly as he has significant photophobia. III,IV, VI: EOMI without ptosis or diploplia. Pupils equal, round and reactive to light with pupils  1 mm and constricted V: Facial sensation is symmetric to temperature VII: Facial movement is symmetric.  VIII: hearing is intact to voice X: Palat elevates symmetrically XI: Shoulder shrug is symmetric. XII: tongue is midline without atrophy or fasciculations.  Motor: Tone is normal. Bulk is normal. 5/5 strength was present in all four extremities.  Sensory: Sensation is symmetric to light touch and temperature in the arms and legs. Deep Tendon Reflexes: 1+ bilateral upper extremities with no knee jerk or ankle jerk  plantars: Toes are downgoing bilaterally.  Cerebellar: FNF and HKS are intact bilaterally   Labs I have reviewed labs in epic and the results pertinent to this consultation are:   CBC    Component Value Date/Time   WBC 6.2 08/30/2019 1139   RBC 4.81 08/30/2019 1139   HGB 13.1 08/30/2019 1139   HCT 40.5 08/30/2019 1139   HCT TEST REQUEST RECEIVED WITHOUT APPROPRIATE SPECIMEN 05/20/2017 0225   PLT 231 08/30/2019 1139   MCV 84.2 08/30/2019 1139   MCH 27.2 08/30/2019 1139   MCHC 32.3 08/30/2019  1139   RDW 14.4 08/30/2019 1139   LYMPHSABS 2.3 08/30/2019 1139   MONOABS 0.3 08/30/2019 1139   EOSABS 0.2 08/30/2019 1139   BASOSABS 0.0 08/30/2019 1139    CMP     Component Value Date/Time   NA 136 07/29/2019 1030   K 4.2 07/29/2019 1030   CL 100 07/29/2019 1030   CO2 25 07/29/2019 1030   GLUCOSE 179 (H) 07/29/2019 1030   BUN 20 07/29/2019 1030   CREATININE 1.23 07/29/2019 1030   CREATININE 0.96 03/14/2019 1046   CALCIUM 9.9 07/29/2019 1030   PROT 8.3 (H) 03/21/2019 1348   ALBUMIN 4.4 03/21/2019 1348   AST 19 03/21/2019 1348   ALT 29 03/21/2019 1348   ALKPHOS 155 (H) 03/21/2019 1348   BILITOT 0.5 03/21/2019 1348   GFRNONAA >60 07/29/2019 1030   GFRAA >60 07/29/2019 1030    Lipid Panel     Component Value Date/Time   CHOL 197 06/12/2018 0449   TRIG 101 06/12/2018 0449   HDL 51 06/12/2018 0449   CHOLHDL 3.9 06/12/2018 0449   VLDL 20 06/12/2018  0449   LDLCALC 126 (H) 06/12/2018 0449     Imaging I have reviewed the images obtained:  CTA of head and neck pending  MRI examination of the brain-unable to obtain secondary to pacemaker  Etta Quill PA-C [facility] [phone]  M-F  (9:00 am- 5:00 PM)  08/30/2019, 12:16 PM    NEUROHOSPITALIST ADDENDUM Performed a face to face diagnostic evaluation.   I have reviewed the contents of history and physical exam as documented by PA/ARNP/Resident and agree with above documentation.  I have discussed and formulated the above plan as documented below.  My examination is slightly different from recorded by PA-C.  63 year old male with past medical history significant for stroke back in 2017 that caused left-sided weakness, seizure, hypertension, hyperlipidemia, diabetes, dementia, CAD, COPD, aortic aneurysm, history of migraines headaches presented to the ED after his daughter noticed patient was very slow in the morning and appeared stiff.  Patient also complaining of a left-sided headache since Sunday associated with photophobia. He was taken to the [place] by his wife, and his PCP told him to go to the emergency room. On assessment he has a mild left facial droop, left leg drift.  He does state that his left leg feels numb but this is chronic.  He has gait imbalance on walking.  In the past he is had both documented right upper extremity weakness as well as left sided weakness.     He has been unable to get a MRI due to his pacemaker in the past.  His last echo was in March 2020 obtained for bacteremia-showed EF of 55 to 60%.  No vegetations or intracardiac thrombi was detected.  Impression Left-sided weakness and left facial droop: Differential diagnosis includes mild acute ischemic stroke versus complex migraine versus recrudescence of prior stroke symptoms vs toxic encephalopathy from cannabis use Migraine headache  Recommendations PT/ OT Repeat CT head in 24 hours PRN  Zofran or Reglan for headache Continue aspirin, statin.       Karena Addison  MD [facility] [phone]   If 7pm to 7am, please call on call as listed on AMION.

## 2019-08-30 NOTE — ED Provider Notes (Addendum)
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 protocol during bolus administration of intravenous contrast. Multiplanar CT image reconstructions and MIPs were obtained to evaluate the vascular anatomy. Carotid stenosis measurements (when applicable) are obtained utilizing NASCET criteria, using the distal internal carotid diameter as the denominator. CONTRAST:  63m OMNIPAQUE IOHEXOL 350 MG/ML SOLN COMPARISON:  Head CT 02/21/2019, CT angiogram head/neck 06/12/2018 FINDINGS: CT HEAD FINDINGS Brain: There is  no acute intracranial hemorrhage or demarcated cortical infarction. No evidence of intracranial mass. No midline shift or extra-axial fluid collection. Patchy hypoattenuation within the cerebral white matter is similar to prior examination. This is nonspecific, but consistent with chronic small vessel ischemic disease. Mild generalized parenchymal atrophy. Vascular: Reported separately Skull: No calvarial fracture or suspicious osseous lesion. Sinuses: No significant paranasal sinus disease or mastoid effusion. Orbits: Imaged orbits demonstrate no acute abnormality. Review of the MIP images confirms the above findings CTA NECK FINDINGS Aortic arch: Common origin of the right brachiocephalic and left common carotid arteries. Imaged portions of the arch demonstrate no evidence of dissection or aneurysm. Atherosclerotic plaque within the visualized aortic arch and proximal major branch vessels. Right carotid system: The common carotid artery is widely patent to the bifurcation. Calcified and noncalcified plaque at the bifurcation without significant stenosis of the proximal cervical ICA. Distal to the bifurcation, the cervical ICA is patent without significant stenosis. Left carotid system: Calcified plaque at the origin of the left common carotid artery. The common carotid artery is widely patent to the bifurcation. Minimal noncalcified plaque at the bifurcation. No significant stenosis of the cervical ICA. Vertebral arteries: The right vertebral artery is patent within the neck to the foramen magnum without significant stenosis. The left vertebral artery is dominant. Calcified plaque at the origin of the left vertebral artery with likely mild/moderate ostial stenosis. Distal to this, the left vertebral artery is patent within the neck without significant stenosis to the foramen magnum. Skeleton: Cervical spondylosis. No acute bony abnormality. 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 Other neck: No soft  tissue neck mass or pathologically enlarged cervical chain lymph nodes. Subcentimeter nodule within the inferior left thyroid lobe not meeting consensus criteria for ultrasound follow-up. 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 Review of the MIP images confirms the above findings CTA HEAD FINDINGS Anterior circulation: The intracranial internal carotid arteries are patent. Atherosclerotic plaque within the bilateral intracranial ICA siphons with regions of mild luminal narrowing. The right middle and anterior cerebral arteries are patent without significant proximal stenosis. The left middle and anterior cerebral arteries are patent without significant  proximal stenosis. Posterior circulation: The intracranial vertebral arteries are patent without significant stenosis. Fenestration within the caudal basilar artery. The basilar artery is patent without significant stenosis. The bilateral posterior cerebral arteries are patent without significant proximal stenosis. Posterior communicating arteries are poorly delineated bilaterally and may be hypoplastic or absent. No intracranial aneurysm is identified. Venous sinuses: Within the limitations of contrast timing, no evidence of thrombosis. Anatomic variants: None significant Review of the MIP images confirms the above findings IMPRESSION: CT head: No CT evidence of acute intracranial abnormality. Mild generalized parenchymal atrophy with chronic small vessel ischemic disease. CTA head: No intracranial large vessel occlusion or proximal high-grade arterial stenosis. Atherosclerotic calcification within the intracranial carotid artery siphons with regions of mild luminal narrowing. CTA neck: Atherosclerotic plaque within the visualized aortic arch and major branch vessels as described. The bilateral common carotid, internal carotid and vertebral arteries are patent within the neck. Calcified plaque results in suspected  mild-to-moderate ostial stenosis at the origin of the dominant left vertebral artery. No other significant stenosis within these vessels. Prominent biapical bullous emphysema. Electronically Signed   By: Kellie Simmering   On: 08/30/2019 14:50    Procedures .Critical Care Performed by: Lennice Sites, DO Authorized by: Lennice Sites, DO   Critical care provider statement:    Critical care time (minutes):  33   Critical care was necessary to treat or prevent imminent or life-threatening deterioration of the following conditions:  CNS failure or compromise   Critical care was time spent personally by me on the following activities:  Blood draw for specimens, development of treatment plan with patient or surrogate, discussions with primary provider, discussions with consultants, evaluation of patient's response to treatment, examination of patient, obtaining history from patient or surrogate, ordering and performing treatments and interventions, ordering and review of laboratory studies, ordering and review of radiographic studies, pulse oximetry, re-evaluation of patient's condition and review of old charts   (including critical care time)  Medications Ordered in ED Medications  cefTRIAXone (ROCEPHIN) 1 g in sodium chloride 0.9 % 100 mL IVPB (has no administration in time range)  prochlorperazine (COMPAZINE) injection 10 mg (10 mg Intravenous Given 08/30/19 1202)  diphenhydrAMINE (BENADRYL) injection 25 mg (25 mg Intravenous Given 08/30/19 1200)  sodium chloride 0.9 % bolus 1,000 mL (0 mLs Intravenous Stopped 08/30/19 1442)  iohexol (OMNIPAQUE) 350 MG/ML injection 75 mL (75 mLs Intravenous Contrast Given 08/30/19 1349)  valproate (DEPACON) 1,000 mg in dextrose 5 % 50 mL IVPB (0 mg Intravenous Stopped 08/30/19 1626)  ketorolac (TORADOL) 15 MG/ML injection 30 mg (30 mg Intravenous Given 08/30/19 1527)  dexamethasone (DECADRON) injection 10 mg (10 mg Intravenous Given 08/30/19 1527)     Initial  Impression / Assessment and Plan / ED Course  I have reviewed the triage vital signs and the nursing notes.  Pertinent labs & imaging results that were available during my care of the patient were reviewed by me and considered in my medical decision making (see chart for details).     DAVAUGHN HILLYARD is a 63 year old male with history of COPD, high cholesterol, pacemaker placement, stroke with left-sided deficits who presents the ED with migraine/headache.  Patient with normal vitals.  No fever.  Patient states worsening migraine type headache over the last 3 days.  However wife states that she noticed worsening left-sided facial droop and unsure if this is chronic or not.  Patient has had some generalized weakness during this time.  Denies any urinary symptoms.  Has taken  Tylenol at home.  Has severe photophobia.  No obvious visual field deficit.  Neurologically he appears intact.  There is a left-sided facial droop but this appears to possibly be chronic.  Do not believe there is an acute stroke and patient is outside of any type of stroke window was not made a code stroke.  Neurology came down to evaluate the patient and they agree.  Recommend headache cocktail and CTA of head and neck.  Have low suspicion for stroke or TIA.  Recommend lab work, headache care.  Patient to get IV Compazine, Benadryl, normal saline bolus.Low suspicion for stroke.  No concern for meningitis.  Does not appear to have any signs to suggest acute angle glaucoma.  No eye pain.    Lab work shows no significant anemia, electrolyte abnormality, kidney injury. No leukocytosis.  Urinalysis concerning for infection.  Will send urine culture.  CTA of the head and neck was overall unremarkable.  No bleeding, no signs of stroke.  Patient was reevaluated by neurology and patient continues to have some left-sided facial droop, left lower leg weakness with drift and recommend admission for stroke work-up.  Patient does have a pacemaker  but it was recently placed and will see if we can get an MRI.  If not patient can get an echocardiogram tomorrow and likely will be started on aspirin and Plavix.  Patient to be admitted to medicine service for further care.  After discussion with hospitalist will start IV Rocephin for possible UTI. No concern for sepsis.   This chart was dictated using voice recognition software.  Despite best efforts to proofread,  errors can occur which can change the documentation meaning.    Final Clinical Impressions(s) / ED Diagnoses   Final diagnoses:  Stroke-like symptoms  Bad headache  Urinary tract infection without hematuria, site unspecified    ED Discharge Orders    None       Lennice Sites, DO 08/30/19 [place], [place], DO 09/15/19 [phone]

## 2019-08-30 NOTE — H&P (Signed)
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 significant stenosis. Left carotid system: Calcified plaque at the origin of the left common carotid artery. The common carotid artery is widely patent to the bifurcation. Minimal noncalcified plaque at the bifurcation. No significant stenosis of the  cervical ICA. Vertebral arteries: The right vertebral artery is patent within the neck to the foramen magnum without significant stenosis. The left vertebral artery is dominant. Calcified plaque at the origin of the left vertebral artery with likely mild/moderate ostial stenosis. Distal to this, the left vertebral artery is patent within the neck without  significant stenosis to the foramen magnum. Skeleton: Cervical spondylosis. No acute bony abnormality. 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 Other neck: No soft tissue neck mass or pathologically enlarged cervical chain lymph nodes. Subcentimeter nodule within the inferior left thyroid lobe not meeting consensus criteria for ultrasound follow-up. 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 Review of the MIP images confirms the above findings CTA HEAD FINDINGS Anterior circulation: The intracranial internal carotid arteries are patent. Atherosclerotic plaque within the bilateral intracranial ICA siphons with regions of mild luminal narrowing. The right middle and anterior cerebral arteries are patent without significant proximal stenosis. The left middle and anterior cerebral arteries are patent without significant proximal stenosis. Posterior circulation: The intracranial vertebral arteries are patent without significant stenosis. Fenestration within the caudal basilar artery. The basilar artery is patent without significant stenosis. The bilateral posterior cerebral arteries are patent without significant proximal stenosis. Posterior communicating arteries are poorly delineated bilaterally and may be hypoplastic or absent. No intracranial aneurysm is identified. Venous sinuses: Within the limitations of contrast timing, no evidence of thrombosis. Anatomic variants: None significant Review of the MIP images confirms the above findings IMPRESSION: CT head: No CT evidence of acute intracranial abnormality. Mild generalized parenchymal atrophy with chronic small vessel ischemic disease. CTA head: No intracranial large vessel occlusion or proximal high-grade arterial stenosis. Atherosclerotic calcification within the intracranial carotid artery siphons with regions of mild luminal narrowing. CTA neck: Atherosclerotic plaque within the visualized aortic arch and  major branch vessels as described. The bilateral common carotid, internal carotid and vertebral arteries are patent within the neck. Calcified plaque results in suspected mild-to-moderate ostial stenosis at the origin of the dominant left vertebral artery. No other significant stenosis within these vessels. Prominent biapical bullous emphysema. Electronically Signed   By: Kellie Simmering   On: 08/30/2019 14:50    EKG: Independently reviewed.  Shows atrial paced rhythm  Assessment/Plan Active Problems:   Cardiac pacemaker in situ   Essential hypertension   COPD mixed type (HCC)   Anxiety and depression   Tremor, essential   Diabetes mellitus type 2 in obese (HCC)   Obstructive sleep apnea   Acute lower UTI   UTI (urinary tract infection)   Headache     Headache with possible stroke/left facial droop: CT head and CT angiogram of the head and neck are all unremarkable.  Although suspicion for stroke is very low however he was seen by neurology and per their recommendations, will admit him for further MRI of the brain (which has been ordered and per ED physician, his pacemaker is MRI compatible) as well as transthoracic echo.  He is already on a statin.  I will defer further antiplatelet medications to neurology.  Will monitor neurochecks.  NIH scale.  Keep n.p.o. until seen by SLP or passes bedside swallow.  Allow permissive hypertension.  Will hold all antihypertensives since blood pressure is normal.  Consult PT OT.  Hypertension: Blood pressure within normal limits.  In order to allow permissive hypertension, I will hold all antihypertensives but diltiazem.  UTI: If he had cystoscopy, is what I understand and the fact that he has some  burning urination and he also endorses having some blood in the urine, this UA is consistent with UTI.  I have requested ER physician to provide him with Rocephin.  I will continue Rocephin starting tomorrow.  Apparently, he is allergic to penicillins however per  records, he has tolerated Rocephin in the past.  History of seizure disorder: Continue Keppra from home.  Mixed COPD: Stable.  Not wheezing on exam.  Resume home medications.  Hyperlipidemia: Doing atorvastatin.  Type 2 diabetes mellitus: Interestingly patient's antidiabetic regimen includes 70/30 insulin as well as Lantus.  At this point in time, I will continue his Lantus from home and place him on SSI.  DVT prophylaxis: Lovenox Code Status: Full code Family Communication: None present at bedside.  Plan of care discussed with patient in length and he verbalized understanding and agreed with it. Disposition Plan: Possible discharge tomorrow. Consults called: Neurology Admission status: Observation   Darliss Cheney MD [facility] Pager [phone]  If 7PM-7AM, please contact night-coverage [email or web address] Password [facility]  08/30/2019, 5:22 PM

## 2019-08-30 NOTE — ED Triage Notes (Signed)
Pt arrived via [facility]   Pt went to [place] because of eye sensitivity since Sunday. Per EMS wife reported pt having slurred speech since 0700 today.   Upon arrival pt complains of migraine and has not taken any medication. Pt reports that he feels he does not have any slurred speech presently.  Per EMS pt reports history of CVA with some right sided deficits and hypertension.

## 2019-08-31 ENCOUNTER — Observation Stay (HOSPITAL_BASED_OUTPATIENT_CLINIC_OR_DEPARTMENT_OTHER): Payer: No Typology Code available for payment source

## 2019-08-31 ENCOUNTER — Observation Stay (HOSPITAL_COMMUNITY): Payer: No Typology Code available for payment source

## 2019-08-31 ENCOUNTER — Other Ambulatory Visit: Payer: Self-pay

## 2019-08-31 DIAGNOSIS — Z950 Presence of cardiac pacemaker: Secondary | ICD-10-CM

## 2019-08-31 DIAGNOSIS — F419 Anxiety disorder, unspecified: Secondary | ICD-10-CM

## 2019-08-31 DIAGNOSIS — E1169 Type 2 diabetes mellitus with other specified complication: Secondary | ICD-10-CM | POA: Diagnosis not present

## 2019-08-31 DIAGNOSIS — G44001 Cluster headache syndrome, unspecified, intractable: Secondary | ICD-10-CM | POA: Diagnosis not present

## 2019-08-31 DIAGNOSIS — J449 Chronic obstructive pulmonary disease, unspecified: Secondary | ICD-10-CM | POA: Diagnosis not present

## 2019-08-31 DIAGNOSIS — E669 Obesity, unspecified: Secondary | ICD-10-CM

## 2019-08-31 DIAGNOSIS — F329 Major depressive disorder, single episode, unspecified: Secondary | ICD-10-CM

## 2019-08-31 DIAGNOSIS — G4733 Obstructive sleep apnea (adult) (pediatric): Secondary | ICD-10-CM

## 2019-08-31 DIAGNOSIS — I10 Essential (primary) hypertension: Secondary | ICD-10-CM | POA: Diagnosis not present

## 2019-08-31 DIAGNOSIS — I6389 Other cerebral infarction: Secondary | ICD-10-CM

## 2019-08-31 DIAGNOSIS — N390 Urinary tract infection, site not specified: Secondary | ICD-10-CM

## 2019-08-31 LAB — ECHOCARDIOGRAM COMPLETE
Height: 74 in
Weight: 3280 oz

## 2019-08-31 LAB — URINE CULTURE: Culture: NO GROWTH

## 2019-08-31 LAB — LIPID PANEL
Cholesterol: 128 mg/dL (ref 0–200)
HDL: 37 mg/dL — ABNORMAL LOW (ref >40)
LDL Cholesterol: 82 mg/dL (ref 0–99)
Total CHOL/HDL Ratio: 3.5 RATIO
Triglycerides: 43 mg/dL (ref <150)
VLDL: 9 mg/dL (ref 0–40)

## 2019-08-31 LAB — GLUCOSE, CAPILLARY: Glucose-Capillary: 337 mg/dL — ABNORMAL HIGH (ref 70–99)

## 2019-08-31 MED ORDER — ASPIRIN EC 81 MG PO TBEC: 81.0000 mg | DELAYED_RELEASE_TABLET | Freq: Every day | ORAL | 0 refills | Status: AC

## 2019-08-31 NOTE — Progress Notes (Signed)
STROKE TEAM PROGRESS NOTE   INTERVAL HISTORY I have obtained history of presenting illness in detail with the patient, reviewed electronic medical records and imaging films and PACS.  He states he presented with left I discomfort, increased watering as well as headache and increased left-sided weakness.  Overnight he feels he was given some medications and left I discomfort and watering is much improved.  He has chronic residual left-sided weakness from an old stroke.  CT scan of the head shows no acute abnormality and CT angiogram shows no significant large vessel intracranial or extracranial stenosis.  Vitals:   08/31/19 0846 08/31/19 0900 08/31/19 1000 08/31/19 1030  BP:  (!) 115/58 107/87 124/64  Pulse: 60 68 79 74  Resp: 14 15 18  (!) 28  Temp:      SpO2: 93% 93% 96% 99%  Weight:      Height:        CBC:  Recent Labs  Lab 08/30/19 1139 08/30/19 2140  WBC 6.2 6.6  NEUTROABS 3.3  --   HGB 13.1 12.6*  HCT 40.5 38.2*  MCV 84.2 84.7  PLT 231 [id number]    Basic Metabolic Panel:  Recent Labs  Lab 08/30/19 1139 08/30/19 2140  NA 134*  --   K 4.2  --   CL 100  --   CO2 22  --   GLUCOSE 144*  --   BUN 15  --   CREATININE 0.80 1.29*  CALCIUM 9.5  --    Lipid Panel:     Component Value Date/Time   CHOL 128 08/31/2019 0435   TRIG 43 08/31/2019 0435   HDL 37 (L) 08/31/2019 0435   CHOLHDL 3.5 08/31/2019 0435   VLDL 9 08/31/2019 0435   LDLCALC 82 08/31/2019 0435   HgbA1c:  Lab Results  Component Value Date   HGBA1C 7.1 (H) 07/29/2019   Urine Drug Screen:     Component Value Date/Time   LABOPIA NONE DETECTED 08/30/2019 1443   COCAINSCRNUR NONE DETECTED 08/30/2019 1443   LABBENZ NONE DETECTED 08/30/2019 1443   AMPHETMU NONE DETECTED 08/30/2019 1443   THCU POSITIVE (A) 08/30/2019 1443   LABBARB NONE DETECTED 08/30/2019 1443    Alcohol Level     Component Value Date/Time   ETH <5 05/19/2017 1908    IMAGING Ct Angio Head W Or Wo Contrast  Result Date:  08/30/2019 CLINICAL DATA:  Headache, acute, normal neuro exam. Additional history provided: Patient reports headaches and sensitivity to light. EXAM: CT ANGIOGRAPHY HEAD AND NECK TECHNIQUE: Multidetector CT imaging of the head and neck was performed using the standard protocol during bolus administration of intravenous contrast. Multiplanar CT image reconstructions and MIPs were obtained to evaluate the vascular anatomy. Carotid stenosis measurements (when applicable) are obtained utilizing NASCET criteria, using the distal internal carotid diameter as the denominator. CONTRAST:  16mL OMNIPAQUE IOHEXOL 350 MG/ML SOLN COMPARISON:  Head CT 02/21/2019, CT angiogram head/neck 06/12/2018 FINDINGS: CT HEAD FINDINGS Brain: There is no acute intracranial hemorrhage or demarcated cortical infarction. No evidence of intracranial mass. No midline shift or extra-axial fluid collection. Patchy hypoattenuation within the cerebral white matter is similar to prior examination. This is nonspecific, but consistent with chronic small vessel ischemic disease. Mild generalized parenchymal atrophy. Vascular: Reported separately Skull: No calvarial fracture or suspicious osseous lesion. Sinuses: No significant paranasal sinus disease or mastoid effusion. Orbits: Imaged orbits demonstrate no acute abnormality. Review of the MIP images confirms the above findings CTA NECK FINDINGS Aortic arch: Common origin of the  right brachiocephalic and left common carotid arteries. Imaged portions of the arch demonstrate no evidence of dissection or aneurysm. Atherosclerotic plaque within the visualized aortic arch and proximal major branch vessels. Right carotid system: The common carotid artery is widely patent to the bifurcation. Calcified and noncalcified plaque at the bifurcation without significant stenosis of the proximal cervical ICA. Distal to the bifurcation, the cervical ICA is patent without significant stenosis. Left carotid system:  Calcified plaque at the origin of the left common carotid artery. The common carotid artery is widely patent to the bifurcation. Minimal noncalcified plaque at the bifurcation. No significant stenosis of the cervical ICA. Vertebral arteries: The right vertebral artery is patent within the neck to the foramen magnum without significant stenosis. The left vertebral artery is dominant. Calcified plaque at the origin of the left vertebral artery with likely mild/moderate ostial stenosis. Distal to this, the left vertebral artery is patent within the neck without significant stenosis to the foramen magnum. Skeleton: Cervical spondylosis. No acute bony abnormality. 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 Other neck: No soft tissue neck mass or pathologically enlarged cervical chain lymph nodes. Subcentimeter nodule within the inferior left thyroid lobe not meeting consensus criteria for ultrasound follow-up. 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 Review of the MIP images confirms the above findings CTA HEAD FINDINGS Anterior circulation: The intracranial internal carotid arteries are patent. Atherosclerotic plaque within the bilateral intracranial ICA siphons with regions of mild luminal narrowing. The right middle and anterior cerebral arteries are patent without significant proximal stenosis. The left middle and anterior cerebral arteries are patent without significant proximal stenosis. Posterior circulation: The intracranial vertebral arteries are patent without significant stenosis. Fenestration within the caudal basilar artery. The basilar artery is patent without significant stenosis. The bilateral posterior cerebral arteries are patent without significant proximal stenosis. Posterior communicating arteries are poorly delineated bilaterally and may be hypoplastic or absent. No intracranial aneurysm is identified. Venous sinuses: Within the limitations of  contrast timing, no evidence of thrombosis. Anatomic variants: None significant Review of the MIP images confirms the above findings IMPRESSION: CT head: No CT evidence of acute intracranial abnormality. Mild generalized parenchymal atrophy with chronic small vessel ischemic disease. CTA head: No intracranial large vessel occlusion or proximal high-grade arterial stenosis. Atherosclerotic calcification within the intracranial carotid artery siphons with regions of mild luminal narrowing. CTA neck: Atherosclerotic plaque within the visualized aortic arch and major branch vessels as described. The bilateral common carotid, internal carotid and vertebral arteries are patent within the neck. Calcified plaque results in suspected mild-to-moderate ostial stenosis at the origin of the dominant left vertebral artery. No other significant stenosis within these vessels. Prominent biapical bullous emphysema. Electronically Signed   By: Kellie Simmering   On: 08/30/2019 14:50   Ct Angio Neck W And/or Wo Contrast  Result Date: 08/30/2019 CLINICAL DATA:  Headache, acute, normal neuro exam. Additional history provided: Patient reports headaches and sensitivity to light. EXAM: CT ANGIOGRAPHY HEAD AND NECK TECHNIQUE: Multidetector CT imaging of the head and neck was performed using the standard protocol during bolus administration of intravenous contrast. Multiplanar CT image reconstructions and MIPs were obtained to evaluate the vascular anatomy. Carotid stenosis measurements (when applicable) are obtained utilizing NASCET criteria, using the distal internal carotid diameter as the denominator. CONTRAST:  31mL OMNIPAQUE IOHEXOL 350 MG/ML SOLN COMPARISON:  Head CT 02/21/2019, CT angiogram head/neck 06/12/2018 FINDINGS: CT HEAD FINDINGS Brain: There is no acute intracranial hemorrhage or demarcated  cortical infarction. No evidence of intracranial mass. No midline shift or extra-axial fluid collection. Patchy hypoattenuation within  the cerebral white matter is similar to prior examination. This is nonspecific, but consistent with chronic small vessel ischemic disease. Mild generalized parenchymal atrophy. Vascular: Reported separately Skull: No calvarial fracture or suspicious osseous lesion. Sinuses: No significant paranasal sinus disease or mastoid effusion. Orbits: Imaged orbits demonstrate no acute abnormality. Review of the MIP images confirms the above findings CTA NECK FINDINGS Aortic arch: Common origin of the right brachiocephalic and left common carotid arteries. Imaged portions of the arch demonstrate no evidence of dissection or aneurysm. Atherosclerotic plaque within the visualized aortic arch and proximal major branch vessels. Right carotid system: The common carotid artery is widely patent to the bifurcation. Calcified and noncalcified plaque at the bifurcation without significant stenosis of the proximal cervical ICA. Distal to the bifurcation, the cervical ICA is patent without significant stenosis. Left carotid system: Calcified plaque at the origin of the left common carotid artery. The common carotid artery is widely patent to the bifurcation. Minimal noncalcified plaque at the bifurcation. No significant stenosis of the cervical ICA. Vertebral arteries: The right vertebral artery is patent within the neck to the foramen magnum without significant stenosis. The left vertebral artery is dominant. Calcified plaque at the origin of the left vertebral artery with likely mild/moderate ostial stenosis. Distal to this, the left vertebral artery is patent within the neck without significant stenosis to the foramen magnum. Skeleton: Cervical spondylosis. No acute bony abnormality. 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 Other neck: No soft tissue neck mass or pathologically enlarged cervical chain lymph nodes. Subcentimeter nodule within the inferior left thyroid lobe not meeting consensus criteria for ultrasound  follow-up. 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 Review of the MIP images confirms the above findings CTA HEAD FINDINGS Anterior circulation: The intracranial internal carotid arteries are patent. Atherosclerotic plaque within the bilateral intracranial ICA siphons with regions of mild luminal narrowing. The right middle and anterior cerebral arteries are patent without significant proximal stenosis. The left middle and anterior cerebral arteries are patent without significant proximal stenosis. Posterior circulation: The intracranial vertebral arteries are patent without significant stenosis. Fenestration within the caudal basilar artery. The basilar artery is patent without significant stenosis. The bilateral posterior cerebral arteries are patent without significant proximal stenosis. Posterior communicating arteries are poorly delineated bilaterally and may be hypoplastic or absent. No intracranial aneurysm is identified. Venous sinuses: Within the limitations of contrast timing, no evidence of thrombosis. Anatomic variants: None significant Review of the MIP images confirms the above findings IMPRESSION: CT head: No CT evidence of acute intracranial abnormality. Mild generalized parenchymal atrophy with chronic small vessel ischemic disease. CTA head: No intracranial large vessel occlusion or proximal high-grade arterial stenosis. Atherosclerotic calcification within the intracranial carotid artery siphons with regions of mild luminal narrowing. CTA neck: Atherosclerotic plaque within the visualized aortic arch and major branch vessels as described. The bilateral common carotid, internal carotid and vertebral arteries are patent within the neck. Calcified plaque results in suspected mild-to-moderate ostial stenosis at the origin of the dominant left vertebral artery. No other significant stenosis within these vessels. Prominent biapical bullous emphysema.  Electronically Signed   By: Kellie Simmering   On: 08/30/2019 14:50    PHYSICAL EXAM Pleasant middle-aged African-American male not in distress.  He does have some congestion in his left eye with mild photophobia Neurological Exam :  Awake alert oriented to  time place and person.  Mild dysarthria but can be easily understood.  Follows commands well.  Extraocular movements are full range without nystagmus.  Blinks to threat bilaterally.  Face is symmetric except mild left nasolabial fold asymmetry when he smiles.  Motor system exam no upper or lower extremity drift but mild weakness of left grip, intrinsic hand muscles.  Diminished fine finger movements on the left.  Orbits right over left upper extremity.  Tone is slightly increased on the left upper and lower extremity compared to the right.  Gait not tested.  Sensation is intact.  Deep tendon reflexes are symmetric.  Plantars are downgoing. ASSESSMENT/PLAN Mr. Wesley [name] is a 63 y.o. male with history of essential tremor, stroke back in 2017 that affected the left side, sleep apnea, seizure, status post pacemaker implantation, left-sided weakness, hypertension, hyperlipidemia, diabetes, dementia per wife, CAD, COPD, aortic aneurysm and migraines with floaters presenting with floaters and eye discomfort that started 9/13. Still with photophobia 9/15. Migraine cocktail in ED helped. Exam found L sided weakness, L facial droop, unsteady gait.  Transient left-sided weakness in the setting of skilled I discomfort and watering as well as headache.  Possible complicated migraine episode versus worsening of old deficits in the setting of acute illness  CT head No acute abnormality. Small vessel disease. Mild atrophy.   CTA head no ELVO. atherosclerosis of ICA siphons  CTA neck Aortic atherosclerosis. Mild stenosis origin : VA. Prominent biapical bullous emphysema.   Repeat CT head No acute abnormality. Small vessel disease.    2D Echo pending  LDL  82  HgbA1c 7.1  Lovenox 40 mg sq daily for VTE prophylaxis  No antithrombotic prior to admission, now on No antithrombotic.  Aspirin 81 mg daily  Therapy recommendations:  pending   Disposition:  pending   Hypertension  Stable . Permissive hypertension (OK if < 220/120) but gradually normalize in 5-7 days . Long-term BP goal normotensive  Hyperlipidemia  Home meds:  Lipitor 40, resumed in hospital  LDL 82, goal < 70  Continue statin at discharge  Diabetes type II Uncontrolled  HgbA1c 7.1, goal < 7.0  Other Stroke Risk Factors  Cigarette smoker, advised to stop smoking  Hx ETOH use, alcohol level <5, advised to drink no more than 2 drink(s) a day  Hx stroke/TIA  05/2018 admission w/ stroke-like sx. TIA vs lacune. Dizziness resolved. Placed on asa and statin  08/2016 - Suspected right brain subcortical stroke, likely small vessel event. CT showed old L caudate head infarct   Coronary artery disease - MI  Migraines  Obstructive sleep apnea, on CPAP at home sporadically   SSS w/ Pacer ([employer]_  Other Active Problems  Baseline Cognitive deficits  GERD  Hx seizures  Hospital day # 0 I have personally obtained history,examined this patient, reviewed notes, independently viewed imaging studies, participated in medical decision making and plan of care.ROS completed by me personally and pertinent positives fully documented  I have made any additions or clarifications directly to the above note.  Patient presented with transient left-sided worsening of pre-existing weakness in the setting of eye discomfort and redness as well as headache.  Unclear whether this represents complicated migraine episodes versus worsening of old deficits in setting of an acute illness.  Doubt TIA.  Recommend repeat CT scan of the head as we cannot do an MRI due to his pacemaker.  Check echocardiogram.  Start aspirin 81 mg daily.  Therapy consults.D/w  Dr Horris Latino I have  spent a total of  25   minutes with the patient reviewing hospital notes,  test results, labs and examining the patient as well as establishing an assessment and plan that was discussed personally with the patient.  > 50% of time was spent in direct patient care.      Wesley Contras, MD Medical Director [facility] Pager: [phone] 08/31/2019 2:46 PM   To contact Stroke Continuity provider, please refer to [email or web address] After hours, contact General Neurology

## 2019-08-31 NOTE — Evaluation (Signed)
Physical Therapy Evaluation Patient Details Name: Wesley [name] MRN: [id number] DOB: [birth year] Today's Date: 08/31/2019   History of Present Illness  63 yo male admitted to ED on 9/15 with headaches, photophobia x3 days. Workup for migraines, vs toxic encephalopathy, vs CVA (unable to obtain MRI due to pacemaker, no new neuro signs). CT head unremarkable for acute abnormalities. 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  Clinical Impression   Pt presents with LE generalized weakness, increased time and effort to perform mobility tasks, and decreased endurance for activity on PT eval. Pt to benefit from acute PT to address deficits. Pt ambulated hallway distance with RW with min guard assist, pt requires no physical assist throughout session. Pt with no headache during session, and states he is feeling much better vs at time of admission. PT recommends d/c home with continuing with OPPT through the [place]. PT to progress mobility as tolerated, and will continue to follow acutely.      Follow Up Recommendations Supervision for mobility/OOB;Other (comment)([facility])    Equipment Recommendations  None recommended by PT    Recommendations for Other Services       Precautions / Restrictions Precautions Precautions: Fall;ICD/Pacemaker Restrictions Weight Bearing Restrictions: No      Mobility  Bed Mobility Overal bed mobility: Needs Assistance Bed Mobility: Supine to Sit;Sit to Supine     Supine to sit: Min guard;HOB elevated Sit to supine: Min guard;HOB elevated   General bed mobility comments: Min guard for safety, pt with use of bedrails.  Transfers Overall transfer level: Needs assistance Equipment used: Rolling walker (2 wheeled) Transfers: Sit to/from Stand Sit to Stand: Min  guard;From elevated surface         General transfer comment: Min guard for safety, increased time to rise and steady.  Ambulation/Gait Ambulation/Gait assistance: Min guard;+2 safety/equipment Gait Distance (Feet): 75 Feet Assistive device: None;Rolling walker (2 wheeled) Gait Pattern/deviations: Step-through pattern;Decreased stride length;Trunk flexed Gait velocity: decr   General Gait Details: Min guard for safety, verbal cuing for placement, pt correcting posture to upright. Pt limited by lightheadedness, BP and HR WFL.  Stairs            Wheelchair Mobility    Modified Rankin (Stroke Patients Only)       Balance Overall balance assessment: Mild deficits observed, not formally tested                                           Pertinent Vitals/Pain Pain Assessment: No/denies pain    Home Living Family/patient expects to be discharged to:: Private residence Living Arrangements: Children;Spouse/significant other Available Help at Discharge: Family Type of Home: House Home Access: Stairs to enter Entrance Stairs-Rails: Right;Left;Can reach both Entrance Stairs-Number of Steps: 3 Home Layout: One level Home Equipment: Kasandra Knudsen - quad;Walker - 4 wheels;Shower seat      Prior Function Level of Independence: Needs assistance   Gait / Transfers Assistance Needed: quad cane for short distances, rollator for longer distances (checking the mail)           Hand Dominance   Dominant Hand: Right    Extremity/Trunk Assessment   Upper Extremity Assessment Upper Extremity Assessment: Overall WFL for tasks assessed    Lower Extremity  Assessment Lower Extremity Assessment: Generalized weakness    Cervical / Trunk Assessment Cervical / Trunk Assessment: Normal  Communication   Communication: No difficulties  Cognition Arousal/Alertness: Awake/alert Behavior During Therapy: WFL for tasks assessed/performed Overall Cognitive Status: History of  cognitive impairments - at baseline                                 General Comments: history of dementia per chart review, pt WFL during PT session.      General Comments      Exercises     Assessment/Plan    PT Assessment Patient needs continued PT services  PT Problem List Decreased strength;Decreased mobility;Decreased activity tolerance;Decreased balance;Decreased knowledge of use of DME       PT Treatment Interventions DME instruction;Therapeutic activities;Therapeutic exercise;Gait training;Patient/family education;Balance training;Stair training;Functional mobility training    PT Goals (Current goals can be found in the Care Plan section)  Acute Rehab PT Goals Patient Stated Goal: go home PT Goal Formulation: With patient Time For Goal Achievement: 09/14/19 Potential to Achieve Goals: Good    Frequency Min 3X/week   Barriers to discharge        Co-evaluation               AM-PAC PT "6 Clicks" Mobility  Outcome Measure Help needed turning from your back to your side while in a flat bed without using bedrails?: A Little Help needed moving from lying on your back to sitting on the side of a flat bed without using bedrails?: A Little Help needed moving to and from a bed to a chair (including a wheelchair)?: A Little Help needed standing up from a chair using your arms (e.g., wheelchair or bedside chair)?: A Little Help needed to walk in hospital room?: A Little Help needed climbing 3-5 steps with a railing? : A Little 6 Click Score: 18    End of Session Equipment Utilized During Treatment: Gait belt Activity Tolerance: Patient tolerated treatment well;Patient limited by fatigue Patient left: in bed;with call bell/phone within reach Nurse Communication: Mobility status PT Visit Diagnosis: Muscle weakness (generalized) (M62.81);Difficulty in walking, not elsewhere classified (R26.2)    Time: [id number] PT Time Calculation (min) (ACUTE ONLY):  19 min   Charges:   PT Evaluation $PT Eval Low Complexity: 1 Low          Julien Girt, PT Acute Rehabilitation Services Pager [phone]  Office [phone]   Wesley [name] 08/31/2019, 3:06 PM

## 2019-08-31 NOTE — Progress Notes (Signed)
Patient's pacemaker is not MRI conditional and unsafe. Therefore, patient is unable to have a MRI exam. Dr Horris Latino notified of this and order cancelled.

## 2019-08-31 NOTE — Progress Notes (Signed)
  Echocardiogram 2D Echocardiogram has been performed.  Wesley [name] 08/31/2019, 11:38 AM

## 2019-08-31 NOTE — Discharge Summary (Addendum)
Discharge Summary  [name]:[id number] DOB: [birth year]  PCP: [facility]  Admit date: 08/30/2019 Discharge date: 09/01/2019  Time spent: 30 mins  Recommendations for Outpatient Follow-up:  1. Follow-up with PCP at the [facility] 2. Follow-up with neurology at the [facility] 3. Follow up with ophthalmology at the [facility]  Discharge Diagnoses:  Active Hospital Problems   Diagnosis Date Noted  . Headache 08/30/2019  . UTI (urinary tract infection) 04/24/2018  . Acute lower UTI 02/20/2018  . Obstructive sleep apnea 09/23/2016  . Diabetes mellitus type 2 in obese ([place]) 08/31/2016  . Tremor, essential 03/31/2016  . Anxiety and depression 11/01/2015  . Cardiac pacemaker in situ 10/04/2015  . Essential hypertension 10/04/2015  . COPD mixed type [facility]) 10/04/2015    Resolved Hospital Problems  No resolved problems to display.    Discharge Condition: Stable  Diet recommendation: Heart healthy  Vitals:   08/31/19 1330 08/31/19 1402  BP: 137/75 132/78  Pulse: 61 66  Resp: (!) 21 16  Temp:  98.2 F (36.8 C)  SpO2: 98% 98%    History of present illness:  Wesley [name] is a 63 y.o. 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 According to patient, he started having left temporal headache associated with floaters and eye discomfort as well as light sensitivity on Sunday.  This has been persistent.  Headache is sharp, 8 out of 10 and nonradiating.  He went to see ophthalmologist at the [facility], however they could not complete the physical examination due to very high light sensitivity.  He was subsequently referred to the emergency department.  Reportedly, patient also has a history of migraine headache but he is not on any abortive medications or any  prophylactic medications.  According to patient, his migraine headaches start with floaters however the headache that he has today is much more severe and has light sensitivity.  Patient denies any other focal weakness however he does have a history of a stroke in the past and according to patient that has left him with weakness in both lower extremities and he uses walker and cane.  In the ED, CT head which was unremarkable.  Neurology was consulted and he underwent CT angiogram of the head and neck which was also unremarkable.  Neurology  recommended a repeat CT head today, which was also unremarkable.  MRI cannot be done due to patient's pacemaker which is currently not compatible. UA was checked which has positive leukoesterase, UC negative.    Today, patient reported feeling much better, denies any further headache, or any focal neurologic deficits.  Patient reports vision much better, definitely less sensitive to light than admission.  Discussed in details with wife, who agreed to follow-up with the ophthalmologist and neurologist at the [facility], as well as his [place] PCP.  Neurology stable for discharge from their standpoint.     Hospital Course:  Active Problems:   Cardiac pacemaker in situ   Essential hypertension   COPD mixed type (HCC)   Anxiety and depression   Tremor, essential   Diabetes mellitus type 2 in obese (HCC)   Obstructive sleep apnea   Acute lower UTI   UTI (urinary tract infection)   Headache  Likely complicated migraine Improved headache CT head/CT angiogram of the head and neck are unremarkable Pacemaker not compatible with  MRI Echo with normal EF Neurology on board, unlikely TIA/new CVA, recommend aspirin Start aspirin [facility] PT/OT/SLP, no further follow-up  History of glaucoma Follow-up with outpatient ophthalmologist  History of CVA Imaging as above Continue aspirin, statins Follow-up with neurologist at  the [facility]  Hypertension Continue home meds  History of seizure disorder Continue home Keppra  DM type II Continue home regimen  Mixed COPD Continue home regimen        Malnutrition Type:      Malnutrition Characteristics:      Nutrition Interventions:      Estimated body mass index is 26.32 kg/m as calculated from the following:   Height as of this encounter: '6\' 2"'  (1.88 m).   Weight as of this encounter: 93 kg.    Procedures:  None  Consultations:  Neurology  Discharge Exam: BP 132/78 (BP Location: Right Arm)   Pulse 66   Temp 98.2 F (36.8 C) (Oral)   Resp 16   Ht '6\' 2"'  (1.88 m)   Wt 93 kg   SpO2 98%   BMI 26.32 kg/m   General: NAD Cardiovascular: S1, S2 present Respiratory: CTA B  Discharge Instructions You were cared for by a hospitalist during your hospital stay. If you have any questions about your discharge medications or the care you received while you were in the hospital after you are discharged, you can call the unit and asked to speak with the hospitalist on call if the hospitalist that took care of you is not available. Once you are discharged, your primary care physician will handle any further medical issues. Please note that NO REFILLS for any discharge medications will be authorized once you are discharged, as it is imperative that you return to your primary care physician (or establish a relationship with a primary care physician if you do not have one) for your aftercare needs so that they can reassess your need for medications and monitor your lab values.   Allergies as of 08/31/2019      Reactions   Penicillins Swelling   "General swelling" Tolerated cefepime & ceftriaxone previously. Has patient had a PCN reaction causing immediate rash, facial/tongue/throat swelling, SOB or lightheadedness with hypotension: Yes Has patient had a PCN reaction causing severe rash involving mucus membranes or skin necrosis: Unk Has patient had a  PCN reaction that required hospitalization: Yes Has patient had a PCN reaction occurring within the last 10 years: No If all of the above answers are "NO", then may proceed with Cephalosporin   Percocet [oxycodone-acetaminophen]    Itching   Levaquin [levofloxacin In D5w] Hives      Medication List    TAKE these medications   Accu-Chek Aviva Plus w/Device Kit 1 Device by Does not apply route 4 (four) times daily.   acetaminophen 325 MG tablet Commonly known as: TYLENOL Take 650 mg by mouth every 6 (six) hours as needed for mild pain or headache.   albuterol (2.5 MG/3ML) 0.083% nebulizer solution Commonly known as: PROVENTIL Take 2.5 mg by nebulization 2 (two) times daily as needed for wheezing or shortness of breath.   albuterol 108 (90 Base) MCG/ACT inhaler Commonly known as: VENTOLIN HFA Inhale 2 puffs into the lungs every 6 (six) hours as needed for wheezing or shortness of breath.   Asmanex (120 Metered Doses) 220 MCG/INH inhaler Generic drug: mometasone Inhale 2 puffs into the lungs at bedtime.   aspirin EC 81 MG tablet Take 1 tablet (  81 mg total) by mouth daily.   atorvastatin 40 MG tablet Commonly known as: LIPITOR Take 40 mg by mouth at bedtime.   bisacodyl 10 MG suppository Commonly known as: DULCOLAX Place 10 mg rectally as needed for moderate constipation.   brimonidine 0.2 % ophthalmic solution Commonly known as: ALPHAGAN Place 1 drop into both eyes at bedtime.   carboxymethylcellulose 0.5 % Soln Commonly known as: REFRESH PLUS Place 1 drop into both eyes 2 (two) times daily.   cetirizine 10 MG tablet Commonly known as: ZYRTEC Take 1 tablet (10 mg total) by mouth daily. What changed:   when to take this  reasons to take this   cyclobenzaprine 10 MG tablet Commonly known as: FLEXERIL Take 10 mg by mouth at bedtime as needed for muscle spasms.   diclofenac sodium 1 % Gel Commonly known as: VOLTAREN Apply 4 g topically 4 (four) times daily as  needed (pain.).   diltiazem 180 MG 24 hr capsule Commonly known as: DILACOR XR Take 180 mg by mouth at bedtime.   Ensure Plus Liqd Take 237 mLs by mouth See admin instructions. Drinks 1 bottle one time a day but if he hasn't eaten much through the day may drink another one.   finasteride 5 MG tablet Commonly known as: PROSCAR Take 5 mg by mouth daily.   fluticasone 50 MCG/ACT nasal spray Commonly known as: FLONASE Place 2 sprays into both nostrils daily as needed for allergies.   gabapentin 300 MG capsule Commonly known as: NEURONTIN Take 300 mg by mouth 3 (three) times daily.   glucose blood test strip Commonly known as: Accu-Chek Aviva Use as instructed   hydrALAZINE 25 MG tablet Commonly known as: APRESOLINE Take 1 tablet (25 mg total) by mouth every 8 (eight) hours.   hydrochlorothiazide 25 MG tablet Commonly known as: HYDRODIURIL Take 1 tablet (25 mg total) by mouth daily.   HYDROcodone-acetaminophen 5-325 MG tablet Commonly known as: NORCO/VICODIN Take 1-2 tablets by mouth every 6 (six) hours as needed for moderate pain or severe pain. Post-operatively   hydroxypropyl methylcellulose / hypromellose 2.5 % ophthalmic solution Commonly known as: ISOPTO TEARS / GONIOVISC Place 1 drop into both eyes daily.   insulin aspart protamine - aspart (70-30) 100 UNIT/ML FlexPen Commonly known as: NOVOLOG 70/30 MIX Inject 15-25 Units into the skin 2 (two) times daily. Inject 25 units am and 15 units pm   latanoprost 0.005 % ophthalmic solution Commonly known as: XALATAN Place 1 drop into both eyes at bedtime.   levETIRAcetam 500 MG tablet Commonly known as: KEPPRA Take 500-1,000 mg by mouth 2 (two) times daily. 555m in the am 10061mat night   lurasidone 20 MG Tabs tablet Commonly known as: LATUDA Take 20 mg by mouth at bedtime.   metFORMIN 1000 MG tablet Commonly known as: GLUCOPHAGE Take 1,000 mg by mouth 2 (two) times daily.   mirabegron ER 25 MG Tb24 tablet  Commonly known as: MYRBETRIQ Take 25 mg by mouth at bedtime.   mirtazapine 15 MG tablet Commonly known as: REMERON Take 7.5 mg by mouth at bedtime.   nicotine 14 mg/24hr patch Commonly known as: NICODERM CQ - dosed in mg/24 hours Place 14 mg onto the skin every other day.   nicotine 7 mg/24hr patch Commonly known as: NICODERM CQ - dosed in mg/24 hr Place 1 patch (7 mg total) onto the skin daily.   pantoprazole 40 MG tablet Commonly known as: Protonix Take 1 tablet (40 mg total) by mouth 2 (  two) times daily before a meal.   senna-docusate 8.6-50 MG tablet Commonly known as: Senokot-S Take 1 tablet by mouth 2 (two) times daily. While taking strong pain meds to prevent constipation.   sildenafil 100 MG tablet Commonly known as: VIAGRA Take 100 mg by mouth daily as needed for erectile dysfunction.   Stiolto Respimat 2.5-2.5 MCG/ACT Aers Generic drug: Tiotropium Bromide-Olodaterol Inhale 2 puffs into the lungs at bedtime.   tamsulosin 0.4 MG Caps capsule Commonly known as: FLOMAX Take 1 capsule (0.4 mg total) by mouth daily after supper. What changed: when to take this   venlafaxine XR 150 MG 24 hr capsule Commonly known as: EFFEXOR-XR Take 150 mg by mouth at bedtime.   vitamin B-12 500 MCG tablet Commonly known as: CYANOCOBALAMIN Take 500 mcg by mouth daily.   Vitamin D3 50 MCG (2000 UT) Tabs Take 2,000 Units by mouth daily.      Allergies  Allergen Reactions  . Penicillins Swelling    "General swelling" Tolerated cefepime & ceftriaxone previously. Has patient had a PCN reaction causing immediate rash, facial/tongue/throat swelling, SOB or lightheadedness with hypotension: Yes Has patient had a PCN reaction causing severe rash involving mucus membranes or skin necrosis: Unk Has patient had a PCN reaction that required hospitalization: Yes Has patient had a PCN reaction occurring within the last 10 years: No If all of the above answers are "NO", then may proceed  with Cephalosporin  . Percocet [Oxycodone-Acetaminophen]     Itching  . Levaquin [Levofloxacin In D5w] [place]. Schedule an appointment as soon as possible for a visit in 1 week(s).   Contact information: [place] [address] [phone]            The results of significant diagnostics from this hospitalization (including imaging, microbiology, ancillary and laboratory) are listed below for reference.    Significant Diagnostic Studies: Ct Angio Head W Or Wo Contrast  Result Date: 08/30/2019 CLINICAL DATA:  Headache, acute, normal neuro exam. Additional history provided: Patient reports headaches and sensitivity to light. EXAM: CT ANGIOGRAPHY HEAD AND NECK TECHNIQUE: Multidetector CT imaging of the head and neck was performed using the standard protocol during bolus administration of intravenous contrast. Multiplanar CT image reconstructions and MIPs were obtained to evaluate the vascular anatomy. Carotid stenosis measurements (when applicable) are obtained utilizing NASCET criteria, using the distal internal carotid diameter as the denominator. CONTRAST:  23m OMNIPAQUE IOHEXOL 350 MG/ML SOLN COMPARISON:  Head CT 02/21/2019, CT angiogram head/neck 06/12/2018 FINDINGS: CT HEAD FINDINGS Brain: There is no acute intracranial hemorrhage or demarcated cortical infarction. No evidence of intracranial mass. No midline shift or extra-axial fluid collection. Patchy hypoattenuation within the cerebral white matter is similar to prior examination. This is nonspecific, but consistent with chronic small vessel ischemic disease. Mild generalized parenchymal atrophy. Vascular: Reported separately Skull: No calvarial fracture or suspicious osseous lesion. Sinuses: No significant paranasal sinus disease or mastoid effusion. Orbits: Imaged orbits demonstrate no acute abnormality. Review of the MIP images confirms the above  findings CTA NECK FINDINGS Aortic arch: Common origin of the right brachiocephalic and left common carotid arteries. Imaged portions of the arch demonstrate no evidence of dissection or aneurysm. Atherosclerotic plaque within the visualized aortic arch and proximal major branch vessels. Right carotid system: The common carotid artery is widely patent to the bifurcation. Calcified and noncalcified plaque at the bifurcation without significant stenosis of the proximal cervical ICA. Distal to the  bifurcation, the cervical ICA is patent without significant stenosis. Left carotid system: Calcified plaque at the origin of the left common carotid artery. The common carotid artery is widely patent to the bifurcation. Minimal noncalcified plaque at the bifurcation. No significant stenosis of the cervical ICA. Vertebral arteries: The right vertebral artery is patent within the neck to the foramen magnum without significant stenosis. The left vertebral artery is dominant. Calcified plaque at the origin of the left vertebral artery with likely mild/moderate ostial stenosis. Distal to this, the left vertebral artery is patent within the neck without significant stenosis to the foramen magnum. Skeleton: Cervical spondylosis. No acute bony abnormality. 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 Other neck: No soft tissue neck mass or pathologically enlarged cervical chain lymph nodes. Subcentimeter nodule within the inferior left thyroid lobe not meeting consensus criteria for ultrasound follow-up. 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 Review of the MIP images confirms the above findings CTA HEAD FINDINGS Anterior circulation: The intracranial internal carotid arteries are patent. Atherosclerotic plaque within the bilateral intracranial ICA siphons with regions of mild luminal narrowing. The right middle and anterior cerebral arteries are patent without significant proximal  stenosis. The left middle and anterior cerebral arteries are patent without significant proximal stenosis. Posterior circulation: The intracranial vertebral arteries are patent without significant stenosis. Fenestration within the caudal basilar artery. The basilar artery is patent without significant stenosis. The bilateral posterior cerebral arteries are patent without significant proximal stenosis. Posterior communicating arteries are poorly delineated bilaterally and may be hypoplastic or absent. No intracranial aneurysm is identified. Venous sinuses: Within the limitations of contrast timing, no evidence of thrombosis. Anatomic variants: None significant Review of the MIP images confirms the above findings IMPRESSION: CT head: No CT evidence of acute intracranial abnormality. Mild generalized parenchymal atrophy with chronic small vessel ischemic disease. CTA head: No intracranial large vessel occlusion or proximal high-grade arterial stenosis. Atherosclerotic calcification within the intracranial carotid artery siphons with regions of mild luminal narrowing. CTA neck: Atherosclerotic plaque within the visualized aortic arch and major branch vessels as described. The bilateral common carotid, internal carotid and vertebral arteries are patent within the neck. Calcified plaque results in suspected mild-to-moderate ostial stenosis at the origin of the dominant left vertebral artery. No other significant stenosis within these vessels. Prominent biapical bullous emphysema. Electronically Signed   By: Kellie Simmering   On: 08/30/2019 14:50   Ct Head Wo Contrast  Result Date: 08/31/2019 CLINICAL DATA:  Stroke follow-up. EXAM: CT HEAD WITHOUT CONTRAST TECHNIQUE: Contiguous axial images were obtained from the base of the skull through the vertex without intravenous contrast. COMPARISON:  CT scan of February 21, 2019 and August 30, 2019. FINDINGS: Brain: Mild chronic ischemic white matter disease is noted. No mass  effect or midline shift is noted. Ventricular size is within normal limits. There is no evidence of mass lesion, hemorrhage or acute infarction. Vascular: No hyperdense vessel or unexpected calcification. Skull: Normal. Negative for fracture or focal lesion. Sinuses/Orbits: No acute finding. Other: None. IMPRESSION: Mild chronic ischemic white matter disease. No acute intracranial abnormality seen. Electronically Signed   By: Marijo Conception M.D.   On: 08/31/2019 11:54   Ct Angio Neck W And/or Wo Contrast  Result Date: 08/30/2019 CLINICAL DATA:  Headache, acute, normal neuro exam. Additional history provided: Patient reports headaches and sensitivity to light. EXAM: CT ANGIOGRAPHY HEAD AND NECK TECHNIQUE: Multidetector CT imaging of the head and neck was performed using  the standard protocol during bolus administration of intravenous contrast. Multiplanar CT image reconstructions and MIPs were obtained to evaluate the vascular anatomy. Carotid stenosis measurements (when applicable) are obtained utilizing NASCET criteria, using the distal internal carotid diameter as the denominator. CONTRAST:  82m OMNIPAQUE IOHEXOL 350 MG/ML SOLN COMPARISON:  Head CT 02/21/2019, CT angiogram head/neck 06/12/2018 FINDINGS: CT HEAD FINDINGS Brain: There is no acute intracranial hemorrhage or demarcated cortical infarction. No evidence of intracranial mass. No midline shift or extra-axial fluid collection. Patchy hypoattenuation within the cerebral white matter is similar to prior examination. This is nonspecific, but consistent with chronic small vessel ischemic disease. Mild generalized parenchymal atrophy. Vascular: Reported separately Skull: No calvarial fracture or suspicious osseous lesion. Sinuses: No significant paranasal sinus disease or mastoid effusion. Orbits: Imaged orbits demonstrate no acute abnormality. Review of the MIP images confirms the above findings CTA NECK FINDINGS Aortic arch: Common origin of the right  brachiocephalic and left common carotid arteries. Imaged portions of the arch demonstrate no evidence of dissection or aneurysm. Atherosclerotic plaque within the visualized aortic arch and proximal major branch vessels. Right carotid system: The common carotid artery is widely patent to the bifurcation. Calcified and noncalcified plaque at the bifurcation without significant stenosis of the proximal cervical ICA. Distal to the bifurcation, the cervical ICA is patent without significant stenosis. Left carotid system: Calcified plaque at the origin of the left common carotid artery. The common carotid artery is widely patent to the bifurcation. Minimal noncalcified plaque at the bifurcation. No significant stenosis of the cervical ICA. Vertebral arteries: The right vertebral artery is patent within the neck to the foramen magnum without significant stenosis. The left vertebral artery is dominant. Calcified plaque at the origin of the left vertebral artery with likely mild/moderate ostial stenosis. Distal to this, the left vertebral artery is patent within the neck without significant stenosis to the foramen magnum. Skeleton: Cervical spondylosis. No acute bony abnormality. 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 Other neck: No soft tissue neck mass or pathologically enlarged cervical chain lymph nodes. Subcentimeter nodule within the inferior left thyroid lobe not meeting consensus criteria for ultrasound follow-up. 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 Review of the MIP images confirms the above findings CTA HEAD FINDINGS Anterior circulation: The intracranial internal carotid arteries are patent. Atherosclerotic plaque within the bilateral intracranial ICA siphons with regions of mild luminal narrowing. The right middle and anterior cerebral arteries are patent without significant proximal stenosis. The left middle and anterior cerebral arteries are  patent without significant proximal stenosis. Posterior circulation: The intracranial vertebral arteries are patent without significant stenosis. Fenestration within the caudal basilar artery. The basilar artery is patent without significant stenosis. The bilateral posterior cerebral arteries are patent without significant proximal stenosis. Posterior communicating arteries are poorly delineated bilaterally and may be hypoplastic or absent. No intracranial aneurysm is identified. Venous sinuses: Within the limitations of contrast timing, no evidence of thrombosis. Anatomic variants: None significant Review of the MIP images confirms the above findings IMPRESSION: CT head: No CT evidence of acute intracranial abnormality. Mild generalized parenchymal atrophy with chronic small vessel ischemic disease. CTA head: No intracranial large vessel occlusion or proximal high-grade arterial stenosis. Atherosclerotic calcification within the intracranial carotid artery siphons with regions of mild luminal narrowing. CTA neck: Atherosclerotic plaque within the visualized aortic arch and major branch vessels as described. The bilateral common carotid, internal carotid and vertebral arteries are patent within the neck. Calcified plaque results in  suspected mild-to-moderate ostial stenosis at the origin of the dominant left vertebral artery. No other significant stenosis within these vessels. Prominent biapical bullous emphysema. Electronically Signed   By: Kellie Simmering   On: 08/30/2019 14:50   Dg C-arm 1-60 Min-no Report  Result Date: 08/03/2019 Fluoroscopy was utilized by the requesting physician.  No radiographic interpretation.    Microbiology: Recent Results (from the past 240 hour(s))  Urine culture     Status: None   Collection Time: 08/30/19  2:47 PM   Specimen: Urine, Clean Catch  Result Value Ref Range Status   Specimen Description URINE, CLEAN CATCH  Final   Special Requests NONE  Final   Culture   Final     NO GROWTH Performed at [facility], [address], [place], [place] [address]    Report Status 08/31/2019 FINAL  Final  SARS Coronavirus 2 [facility] order, Performed in [facility] hospital lab) Nasopharyngeal Nasopharyngeal Swab     Status: None   Collection Time: 08/30/19  9:21 PM   Specimen: Nasopharyngeal Swab  Result Value Ref Range Status   SARS Coronavirus 2 NEGATIVE NEGATIVE Final    Comment: (NOTE) If result is NEGATIVE SARS-CoV-2 target nucleic acids are NOT DETECTED. The SARS-CoV-2 RNA is generally detectable in upper and lower  respiratory specimens during the acute phase of infection. The lowest  concentration of SARS-CoV-2 viral copies this assay can detect is 250  copies / mL. A negative result does not preclude SARS-CoV-2 infection  and should not be used as the sole basis for treatment or other  patient management decisions.  A negative result may occur with  improper specimen collection / handling, submission of specimen other  than nasopharyngeal swab, presence of viral mutation(s) within the  areas targeted by this assay, and inadequate number of viral copies  (<250 copies / mL). A negative result must be combined with clinical  observations, patient history, and epidemiological information. If result is POSITIVE SARS-CoV-2 target nucleic acids are DETECTED. The SARS-CoV-2 RNA is generally detectable in upper and lower  respiratory specimens dur ing the acute phase of infection.  Positive  results are indicative of active infection with SARS-CoV-2.  Clinical  correlation with patient history and other diagnostic information is  necessary to determine patient infection status.  Positive results do  not rule out bacterial infection or co-infection with other viruses. If result is PRESUMPTIVE POSTIVE SARS-CoV-2 nucleic acids MAY BE PRESENT.   A presumptive positive result was obtained on the submitted specimen  and confirmed on repeat testing.  While  2019 novel coronavirus  (SARS-CoV-2) nucleic acids may be present in the submitted sample  additional confirmatory testing may be necessary for epidemiological  and / or clinical management purposes  to differentiate between  SARS-CoV-2 and other Sarbecovirus currently known to infect humans.  If clinically indicated additional testing with an alternate test  methodology [phone]) is advised. The SARS-CoV-2 RNA is generally  detectable in upper and lower respiratory sp ecimens during the acute  phase of infection. The expected result is Negative. Fact Sheet for Patients:  StrictlyIdeas.no Fact Sheet for Healthcare Providers: BankingDealers.co.za This test is not yet approved or cleared by the [place] FDA and has been authorized for detection and/or diagnosis of SARS-CoV-2 by FDA under an Emergency Use Authorization (EUA).  This EUA will remain in effect (meaning this test can be used) for the duration of the COVID-19 declaration under Section 564(b)(1) of the Act, 21 U.S.C. section 360bbb-3(b)(1),  unless the authorization is terminated or revoked sooner. Performed at [facility], [place] [address], [place], [place] [address]      Labs: Basic Metabolic Panel: Recent Labs  Lab 08/30/19 1139 08/30/19 2140  NA 134*  --   K 4.2  --   CL 100  --   CO2 22  --   GLUCOSE 144*  --   BUN 15  --   CREATININE 0.80 1.29*  CALCIUM 9.5  --    Liver Function Tests: Recent Labs  Lab 08/30/19 1139  AST 16  ALT 28  ALKPHOS 143*  BILITOT 0.4  PROT 7.1  ALBUMIN 4.0   No results for input(s): LIPASE, AMYLASE in the last 168 hours. No results for input(s): AMMONIA in the last 168 hours. CBC: Recent Labs  Lab 08/30/19 1139 08/30/19 2140  WBC 6.2 6.6  NEUTROABS 3.3  --   HGB 13.1 12.6*  HCT 40.5 38.2*  MCV 84.2 84.7  PLT 231 213   Cardiac Enzymes: No results for input(s): CKTOTAL, CKMB, CKMBINDEX, TROPONINI in the last  168 hours. BNP: BNP (last 3 results) No results for input(s): BNP in the last 8760 hours.  ProBNP (last 3 results) No results for input(s): PROBNP in the last 8760 hours.  CBG: No results for input(s): GLUCAP in the last 168 hours.     Signed:  Alma Friendly, MD [facility] 08/31/2019, 3:34 PM

## 2019-08-31 NOTE — TOC Transition Note (Signed)
Transition of Care [facility]) - CM/SW Discharge Note   Patient Details  Name: Wesley [name] MRN: [id number] Date of Birth: Mar 03, 1956  [facility] of [facility]) CM/SW Contact:  Pollie Friar, RN Phone Number: 08/31/2019, 3:52 PM   Clinical Narrative:    Pt discharging home with self care. Pt to f/u with his outpatient therapy through the [place].  Pt has PCP, insurance and transportation home.    Final next level of care: Home/Self Care Barriers to Discharge: No Barriers Identified   Patient Goals and CMS Choice        Discharge Placement                       Discharge Plan and Services                                     Social Determinants of Health (SDOH) Interventions     Readmission Risk Interventions Readmission Risk Prevention Plan 02/23/2019  Transportation Screening Complete  Medication Review ([place]) Complete  PCP or Specialist appointment within 3-5 days of discharge Complete  HRI or [place] Complete  SW Recovery Care/Counseling Consult Complete  [place] Not Applicable  Some recent data might be hidden

## 2019-09-01 DIAGNOSIS — G44001 Cluster headache syndrome, unspecified, intractable: Secondary | ICD-10-CM | POA: Diagnosis not present

## 2019-09-01 LAB — HEMOGLOBIN A1C
Hgb A1c MFr Bld: 7.4 % — ABNORMAL HIGH (ref 4.8–5.6)
Mean Plasma Glucose: 166 mg/dL

## 2019-09-01 NOTE — Plan of Care (Signed)
  Problem: Education: Goal: Knowledge of General Education information will improve Description Including pain rating scale, medication(s)/side effects and non-pharmacologic comfort measures Outcome: Progressing   Problem: Health Behavior/Discharge Planning: Goal: Ability to manage health-related needs will improve Outcome: Progressing   

## 2019-09-01 NOTE — Evaluation (Signed)
Occupational Therapy Evaluation Patient Details Name: Wesley [name] MRN: [id number] DOB: Aug 10, 1956 Today's Date: 09/01/2019    History of Present Illness 63 yo male admitted to ED on 9/15 with headaches, photophobia x3 days. Workup for migraines, vs toxic encephalopathy, vs CVA (unable to obtain MRI due to pacemaker, no new neuro signs). CT head unremarkable for acute abnormalities. 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   Clinical Impression   This 63 y/o male presents with the above. PTA pt reports mod independence with ADL and functional mobility using quad cane and rollator for longer distances. Pt performing room level mobility (x2 laps), pushing IV pole during this session with overall minguard assist. Pt currently requiring minguard assist for LB ADL, supervision for toietling and standing grooming ADL. Pt with mild weakness in LUE (residual from previous CVA), reports he still feels numbness/drooping of L side of his face but reports light sensitivity has significantly improved, reports feeling close to his baseline with ADL/mobilty tasks. He will benefit from continued acute OT services to progress pt towards his PLOF. Will follow.     Follow Up Recommendations  No OT follow up;Supervision/Assistance - 24 hour(24hr initially)    Equipment Recommendations  None recommended by OT           Precautions / Restrictions Precautions Precautions: Fall;ICD/Pacemaker Restrictions Weight Bearing Restrictions: No      Mobility Bed Mobility Overal bed mobility: Needs Assistance Bed Mobility: Supine to Sit;Sit to Supine     Supine to sit: Supervision Sit to supine: Supervision   General bed mobility comments: for lines and safety, no physical assist required  Transfers Overall transfer level:  Needs assistance Equipment used: None(iv pole) Transfers: Sit to/from Stand Sit to Stand: Min guard         General transfer comment: minguard for lines, safety and balance    Balance Overall balance assessment: Mild deficits observed, not formally tested                                         ADL either performed or assessed with clinical judgement   ADL Overall ADL's : Needs assistance/impaired Eating/Feeding: Independent   Grooming: Wash/dry hands;Standing;Supervision/safety   Upper Body Bathing: Set up;Sitting   Lower Body Bathing: Min guard;Sit to/from stand   Upper Body Dressing : Set up;Sitting   Lower Body Dressing: Min guard;Sit to/from stand   Toilet Transfer: Min guard;Ambulation;Regular Glass blower/designer Details (indicate cue type and reason): pushing IV pole Toileting- Clothing Manipulation and Hygiene: Supervision/safety;Sit to/from stand Toileting - Clothing Manipulation Details (indicate cue type and reason): pt performing clothing management (gown and pants), pericare with distant supervision from OT     Functional mobility during ADLs: Min guard(pushing IV pole)       Vision Baseline Vision/History: Wears glasses Wears Glasses: ("most of the time") Patient Visual Report: (intiial photophobia, reports has subsided) Vision Assessment?: Yes Eye Alignment: Within Functional Limits Ocular Range of Motion: Within Functional Limits Alignment/Gaze Preference: Within Defined Limits Tracking/Visual Pursuits: Decreased smoothness of horizontal tracking;Decreased smoothness of vertical tracking     Perception     Praxis      Pertinent Vitals/Pain Pain Assessment: No/denies pain     Hand Dominance Right   Extremity/Trunk Assessment Upper Extremity  Assessment Upper Extremity Assessment: LUE deficits/detail LUE Deficits / Details: baseline residual weakness from previous CVA (grossly), pt able to perform functional tasks without  difficulty    Lower Extremity Assessment Lower Extremity Assessment: Defer to PT evaluation   Cervical / Trunk Assessment Cervical / Trunk Assessment: Normal   Communication Communication Communication: No difficulties   Cognition Arousal/Alertness: Awake/alert Behavior During Therapy: WFL for tasks assessed/performed Overall Cognitive Status: History of cognitive impairments - at baseline                                 General Comments: history of dementia per chart review, pt WFL for basic tasks this session.   General Comments       Exercises     Shoulder Instructions      Home Living Family/patient expects to be discharged to:: Private residence Living Arrangements: Children;Spouse/significant other Available Help at Discharge: Family Type of Home: House Home Access: Stairs to enter [employer] of Steps: 3 Entrance Stairs-Rails: Right;Left;Can reach both [place]: One level     Bathroom Shower/Tub: Teacher, early years/pre: [place]: Mining engineer - 4 wheels;Shower seat          Prior Functioning/Environment Level of Independence: Needs assistance  Gait / Transfers Assistance Needed: quad cane for short distances, rollator for longer distances (checking the mail) ADL's / Homemaking Assistance Needed: reports no assist for ADL tasks            OT Problem List: Decreased strength;Decreased range of motion;Decreased activity tolerance;Impaired balance (sitting and/or standing);Impaired vision/perception;Impaired UE functional use      OT Treatment/Interventions: Self-care/ADL training;Therapeutic exercise;Neuromuscular education;DME and/or AE instruction;Therapeutic activities;Visual/perceptual remediation/compensation;Balance training;Patient/family education    OT Goals(Current goals can be found in the care plan section) Acute Rehab OT Goals Patient Stated Goal: go home OT Goal  Formulation: With patient Time For Goal Achievement: 09/15/19 Potential to Achieve Goals: Good  OT Frequency: Min 2X/week   Barriers to D/C:            Co-evaluation              AM-PAC OT "6 Clicks" Daily Activity     Outcome Measure Help from another person eating meals?: None Help from another person taking care of personal grooming?: None Help from another person toileting, which includes using toliet, bedpan, or urinal?: None Help from another person bathing (including washing, rinsing, drying)?: A Little Help from another person to put on and taking off regular upper body clothing?: None Help from another person to put on and taking off regular lower body clothing?: A Little 6 Click Score: 22   End of Session Nurse Communication: Mobility status  Activity Tolerance: Patient tolerated treatment well Patient left: in bed;with call bell/phone within reach;with bed alarm set  OT Visit Diagnosis: Muscle weakness (generalized) (M62.81);Unsteadiness on feet (R26.81)                Time: [id number] OT Time Calculation (min): 14 min Charges:  OT General Charges $OT Visit: 1 Visit OT Evaluation $OT Eval Moderate Complexity: [place], OT [employer] Pager [phone] Office [phone]   Raymondo Band 09/01/2019, 10:01 AM

## 2019-09-01 NOTE — Progress Notes (Signed)
Pt and spouse given oral and written discharge instructions. Pt assisted to vehicle by vounteer staff.

## 2019-09-01 NOTE — TOC Transition Note (Signed)
Transition of Care [facility]) - CM/SW Discharge Note   Patient Details  Name: Wesley [name] MRN: [id number] Date of Birth: 1956-02-05  Transition of Care [facility]) CM/SW Contact:  Pollie Friar, RN Phone Number: 09/01/2019, 1:46 PM   Clinical Narrative:    Wife agreeable to d/c and transporting him home after speaking with MD. [facility]    Final next level of care: Home/Self Care Barriers to Discharge: No Barriers Identified   Patient Goals and CMS Choice        Discharge Placement                       Discharge Plan and Services                                     Social Determinants of Health (SDOH) Interventions     Readmission Risk Interventions Readmission Risk Prevention Plan 02/23/2019  Transportation Screening Complete  Medication Review ([place]) Complete  PCP or Specialist appointment within 3-5 days of discharge Complete  HRI or [place] Complete  SW Recovery Care/Counseling Consult Complete  [place] Not Applicable  Some recent data might be hidden

## 2019-09-01 NOTE — Progress Notes (Signed)
STROKE TEAM PROGRESS NOTE   INTERVAL HISTORY Patient states is feeling better.  His left eye discomfort and watering has stopped.  2D echo was unremarkable.  LDL cholesterol is elevated slightly at 82 mg percent and hemoglobin A1c at 7.4.  Patient is being discharged this morning  Vitals:   09/01/19 0032 09/01/19 0434 09/01/19 0858 09/01/19 1141  BP: 115/64 131/73  (!) 148/83  Pulse: 65 60  60  Resp: 17 18  20   Temp: 98.1 F (36.7 C) 98 F (36.7 C)  98.1 F (36.7 C)  TempSrc: Oral Oral  Oral  SpO2: 93% 95% 97% 99%  Weight:      Height:        CBC:  Recent Labs  Lab 08/30/19 1139 08/30/19 2140  WBC 6.2 6.6  NEUTROABS 3.3  --   HGB 13.1 12.6*  HCT 40.5 38.2*  MCV 84.2 84.7  PLT 231 [id number]    Basic Metabolic Panel:  Recent Labs  Lab 08/30/19 1139 08/30/19 2140  NA 134*  --   K 4.2  --   CL 100  --   CO2 22  --   GLUCOSE 144*  --   BUN 15  --   CREATININE 0.80 1.29*  CALCIUM 9.5  --    Lipid Panel:     Component Value Date/Time   CHOL 128 08/31/2019 0435   TRIG 43 08/31/2019 0435   HDL 37 (L) 08/31/2019 0435   CHOLHDL 3.5 08/31/2019 0435   VLDL 9 08/31/2019 0435   LDLCALC 82 08/31/2019 0435   HgbA1c:  Lab Results  Component Value Date   HGBA1C 7.4 (H) 08/31/2019   Urine Drug Screen:     Component Value Date/Time   LABOPIA NONE DETECTED 08/30/2019 1443   COCAINSCRNUR NONE DETECTED 08/30/2019 1443   LABBENZ NONE DETECTED 08/30/2019 1443   AMPHETMU NONE DETECTED 08/30/2019 1443   THCU POSITIVE (A) 08/30/2019 1443   LABBARB NONE DETECTED 08/30/2019 1443    Alcohol Level     Component Value Date/Time   ETH <5 05/19/2017 1908    IMAGING Ct Head Wo Contrast  Result Date: 08/31/2019 CLINICAL DATA:  Stroke follow-up. EXAM: CT HEAD WITHOUT CONTRAST TECHNIQUE: Contiguous axial images were obtained from the base of the skull through the vertex without intravenous contrast. COMPARISON:  CT scan of February 21, 2019 and August 30, 2019. FINDINGS: Brain: Mild  chronic ischemic white matter disease is noted. No mass effect or midline shift is noted. Ventricular size is within normal limits. There is no evidence of mass lesion, hemorrhage or acute infarction. Vascular: No hyperdense vessel or unexpected calcification. Skull: Normal. Negative for fracture or focal lesion. Sinuses/Orbits: No acute finding. Other: None. IMPRESSION: Mild chronic ischemic white matter disease. No acute intracranial abnormality seen. Electronically Signed   By: Marijo Conception M.D.   On: 08/31/2019 11:54    PHYSICAL EXAM Pleasant middle-aged African-American male not in distress.  He does have some congestion in his left eye with mild photophobia Neurological Exam :  Awake alert oriented to time place and person.  Mild dysarthria but can be easily understood.  Follows commands well.  Extraocular movements are full range without nystagmus.  Blinks to threat bilaterally.  Face is symmetric except mild left nasolabial fold asymmetry when he smiles.  Motor system exam no upper or lower extremity drift but mild weakness of left grip, intrinsic hand muscles.  Diminished fine finger movements on the left.  Orbits right over left upper extremity.  Tone is slightly increased on the left upper and lower extremity compared to the right.  Gait not tested.  Sensation is intact.  Deep tendon reflexes are symmetric.  Plantars are downgoing. ASSESSMENT/PLAN Mr. Wesley [name] is a 63 y.o. male with history of essential tremor, stroke back in 2017 that affected the left side, sleep apnea, seizure, status post pacemaker implantation, left-sided weakness, hypertension, hyperlipidemia, diabetes, dementia per wife, CAD, COPD, aortic aneurysm and migraines with floaters presenting with floaters and eye discomfort that started 9/13. Still with photophobia 9/15. Migraine cocktail in ED helped. Exam found L sided weakness, L facial droop, unsteady gait.  Transient left-sided weakness in the setting of  skilled I discomfort and watering as well as headache.  Possible complicated migraine episode versus worsening of old deficits in the setting of acute illness  CT head No acute abnormality. Small vessel disease. Mild atrophy.   CTA head no ELVO. atherosclerosis of ICA siphons  CTA neck Aortic atherosclerosis. Mild stenosis origin : [facility]. Prominent biapical bullous emphysema.   Repeat CT head No acute abnormality. Small vessel disease.    2D Echo normal ejection fraction.  LDL 82  HgbA1c 7.1  Lovenox 40 mg sq daily for VTE prophylaxis  No antithrombotic prior to admission, now on No antithrombotic.  Aspirin 81 mg daily  Therapy recommendations:  pending   Disposition:  pending   Hypertension  Stable . Permissive hypertension (OK if < 220/120) but gradually normalize in 5-7 days . Long-term BP goal normotensive  Hyperlipidemia  Home meds:  Lipitor 40, resumed in hospital  LDL 82, goal < 70  Continue statin at discharge  Diabetes type II Uncontrolled  HgbA1c 7.4, goal < 7.0  Other Stroke Risk Factors  Cigarette smoker, advised to stop smoking  Hx ETOH use, alcohol level <5, advised to drink no more than 2 drink(s) a day  Hx stroke/TIA  05/2018 admission w/ stroke-like sx. TIA vs lacune. Dizziness resolved. Placed on asa and statin  08/2016 - Suspected right brain subcortical stroke, likely small vessel event. CT showed old L caudate head infarct   Coronary artery disease - MI  Migraines  Obstructive sleep apnea, on CPAP at home sporadically   SSS w/ Pacer ([facility]_  Other Active Problems  Baseline Cognitive deficits  GERD  Hx seizures  Hospital day # 0    Patient presented with transient left-sided worsening of pre-existing weakness in the setting of eye discomfort and redness as well as headache.  Unclear whether this represents complicated migraine episodes versus worsening of old deficits in setting of an acute illness.  Doubt TIA.  Continue  aspirin 81 mg daily.  Patient was counseled to quit smoking marijuana as it may contribute to increased stroke risk.  He voiced understanding.  He was also encouraged to eat a healthy diet and to be active and exercise regularly.     Antony Contras, MD Medical Director [facility] Pager: [phone] 09/01/2019 2:05 PM   To contact Stroke Continuity provider, please refer to [email or web address] After hours, contact General Neurology
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
## 2020-03-06 IMAGING — DX DG ABDOMEN 2V
3 series · 3 of 3 positions shown · non-contrast
Comparison: 11/25/2018

CLINICAL DATA: Ileus

EXAM:
ABDOMEN - 2 VIEW

[abdomen erect]
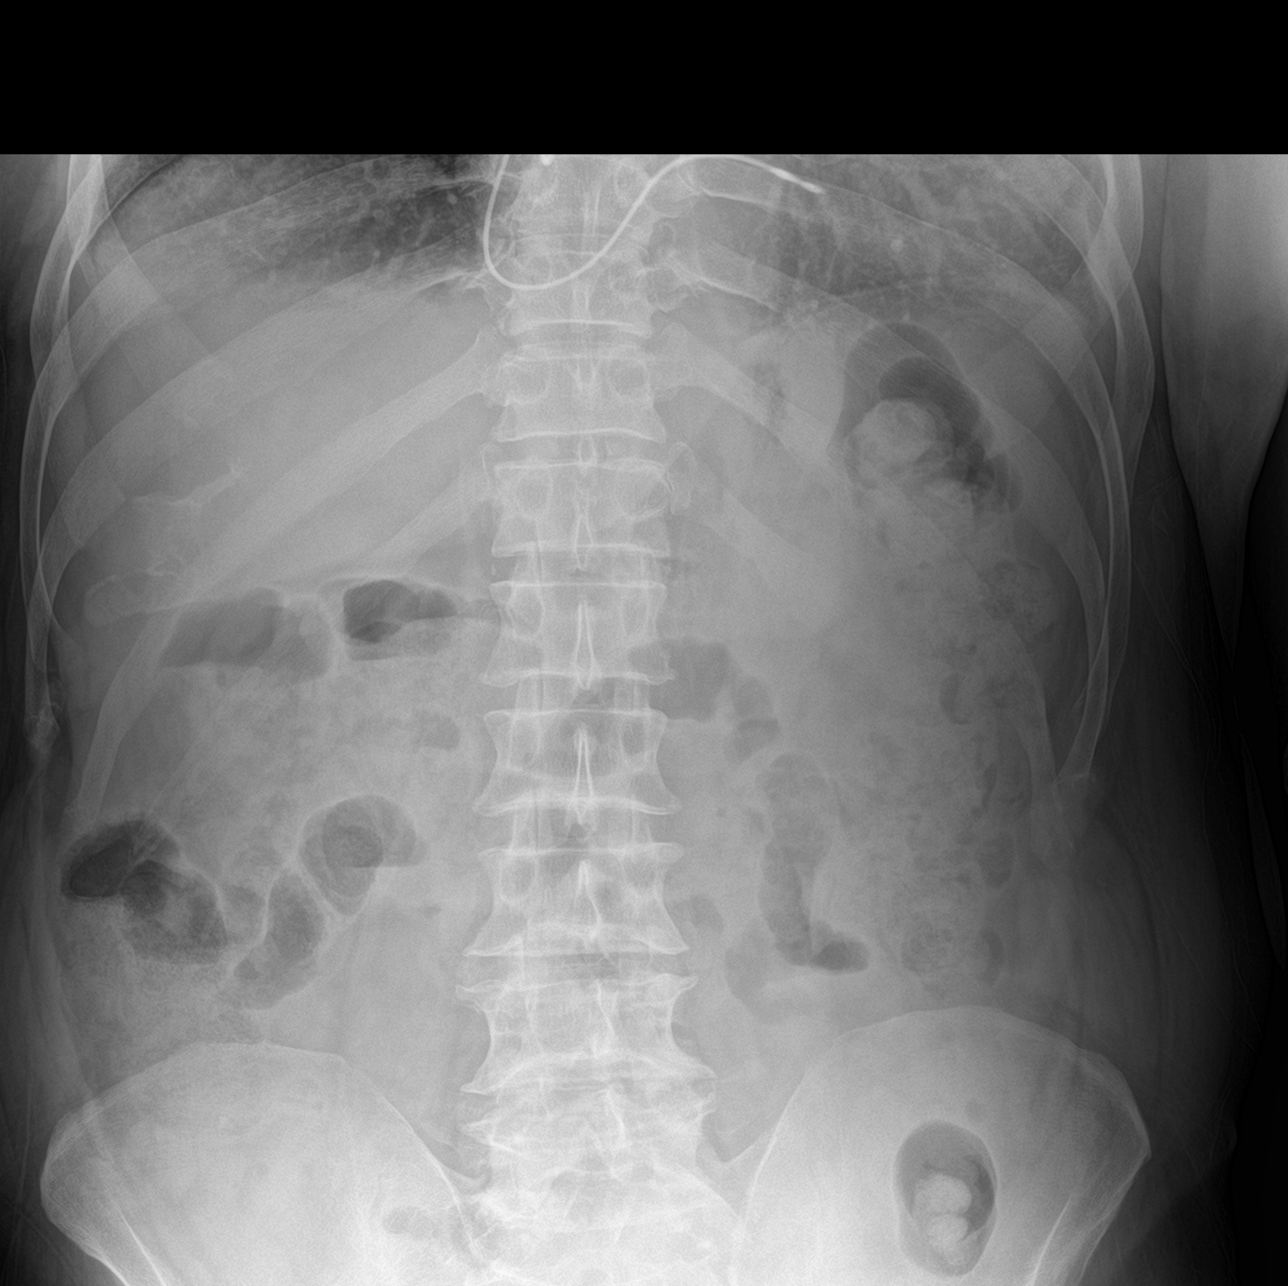

[abdomen supine (1 of 2)]
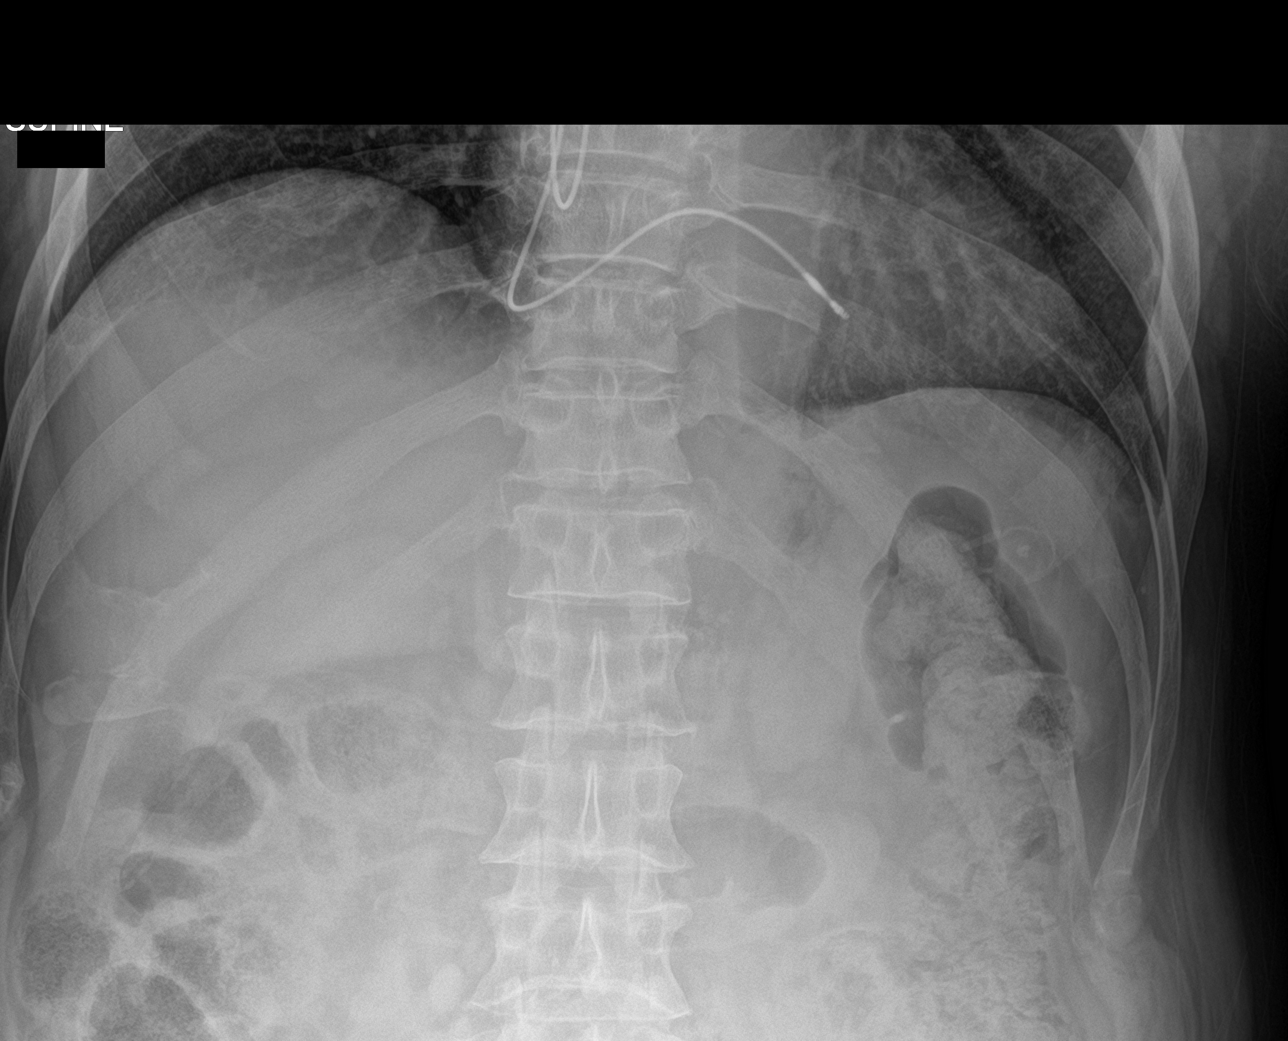

[abdomen supine (2 of 2)]
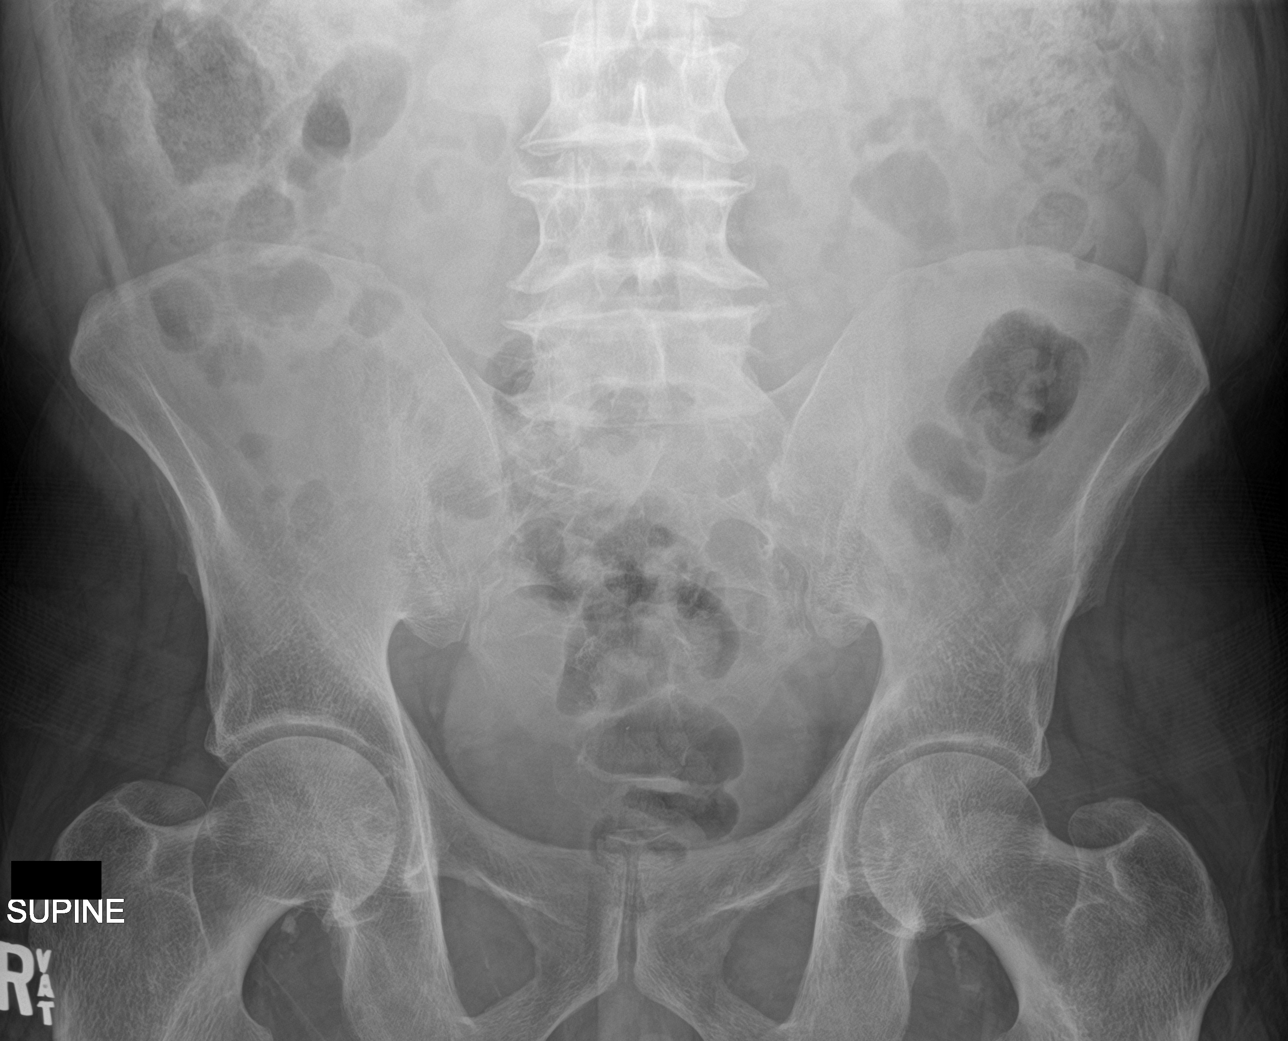

[3 of 3 positions shown; findings below may reference images not displayed]

FINDINGS: Improved bowel-gas pattern with decreasing bowel distention. Large
stool burden throughout the colon. No organomegaly or free air.
IMPRESSION: Improved bowel gas pattern. No current evidence for obstruction or
ileus. Large stool burden throughout the colon.

## 2020-04-05 ENCOUNTER — Emergency Department (HOSPITAL_COMMUNITY)
Admission: EM | Admit: 2020-04-05 | Discharge: 2020-04-05 | Disposition: A | Discharge status: Home / Self Care | Payer: No Typology Code available for payment source | Attending: Emergency Medicine | Admitting: Emergency Medicine

## 2020-04-05 ENCOUNTER — Emergency Department (HOSPITAL_COMMUNITY): Payer: No Typology Code available for payment source

## 2020-04-05 ENCOUNTER — Encounter (HOSPITAL_COMMUNITY): Payer: Self-pay | Admitting: Emergency Medicine

## 2020-04-05 ENCOUNTER — Other Ambulatory Visit: Payer: Self-pay

## 2020-04-05 DIAGNOSIS — R519 Headache, unspecified: Secondary | ICD-10-CM | POA: Insufficient documentation

## 2020-04-05 DIAGNOSIS — R42 Dizziness and giddiness: Secondary | ICD-10-CM | POA: Diagnosis not present

## 2020-04-05 DIAGNOSIS — R531 Weakness: Secondary | ICD-10-CM

## 2020-04-05 DIAGNOSIS — Z20822 Contact with and (suspected) exposure to covid-19: Secondary | ICD-10-CM | POA: Insufficient documentation

## 2020-04-05 DIAGNOSIS — Z79899 Other long term (current) drug therapy: Secondary | ICD-10-CM | POA: Insufficient documentation

## 2020-04-05 DIAGNOSIS — E119 Type 2 diabetes mellitus without complications: Secondary | ICD-10-CM | POA: Insufficient documentation

## 2020-04-05 DIAGNOSIS — I2510 Atherosclerotic heart disease of native coronary artery without angina pectoris: Secondary | ICD-10-CM | POA: Insufficient documentation

## 2020-04-05 DIAGNOSIS — R4182 Altered mental status, unspecified: Secondary | ICD-10-CM | POA: Diagnosis present

## 2020-04-05 DIAGNOSIS — F0390 Unspecified dementia without behavioral disturbance: Secondary | ICD-10-CM | POA: Insufficient documentation

## 2020-04-05 DIAGNOSIS — Z85528 Personal history of other malignant neoplasm of kidney: Secondary | ICD-10-CM | POA: Insufficient documentation

## 2020-04-05 DIAGNOSIS — Z7984 Long term (current) use of oral hypoglycemic drugs: Secondary | ICD-10-CM | POA: Diagnosis not present

## 2020-04-05 DIAGNOSIS — F17210 Nicotine dependence, cigarettes, uncomplicated: Secondary | ICD-10-CM | POA: Diagnosis not present

## 2020-04-05 DIAGNOSIS — J449 Chronic obstructive pulmonary disease, unspecified: Secondary | ICD-10-CM | POA: Diagnosis not present

## 2020-04-05 DIAGNOSIS — G9340 Encephalopathy, unspecified: Secondary | ICD-10-CM | POA: Diagnosis not present

## 2020-04-05 DIAGNOSIS — Z950 Presence of cardiac pacemaker: Secondary | ICD-10-CM | POA: Insufficient documentation

## 2020-04-05 DIAGNOSIS — I10 Essential (primary) hypertension: Secondary | ICD-10-CM | POA: Insufficient documentation

## 2020-04-05 LAB — URINALYSIS, ROUTINE W REFLEX MICROSCOPIC
Bacteria, UA: NONE SEEN
Bilirubin Urine: NEGATIVE
Glucose, UA: >=500 mg/dL — AB
Ketones, ur: NEGATIVE mg/dL
Leukocytes,Ua: NEGATIVE
Nitrite: NEGATIVE
Protein, ur: NEGATIVE mg/dL
Specific Gravity, Urine: 1.016 (ref 1.005–1.030)
pH: 5 (ref 5.0–8.0)

## 2020-04-05 LAB — COMPREHENSIVE METABOLIC PANEL
ALT: 27 U/L (ref 0–44)
AST: 19 U/L (ref 15–41)
Albumin: 4.3 g/dL (ref 3.5–5.0)
Alkaline Phosphatase: 132 U/L — ABNORMAL HIGH (ref 38–126)
Anion gap: 8 (ref 5–15)
BUN: 11 mg/dL (ref 8–23)
CO2: 26 mmol/L (ref 22–32)
Calcium: 9.4 mg/dL (ref 8.9–10.3)
Chloride: 104 mmol/L (ref 98–111)
Creatinine, Ser: 0.92 mg/dL (ref 0.61–1.24)
GFR calc Af Amer: >60 mL/min (ref >60)
GFR calc non Af Amer: >60 mL/min (ref >60)
Glucose, Bld: 209 mg/dL — ABNORMAL HIGH (ref 70–99)
Potassium: 4 mmol/L (ref 3.5–5.1)
Sodium: 138 mmol/L (ref 135–145)
Total Bilirubin: 0.4 mg/dL (ref 0.3–1.2)
Total Protein: 7.6 g/dL (ref 6.5–8.1)

## 2020-04-05 LAB — DIFFERENTIAL
Abs Immature Granulocytes: 0.01 10*3/uL (ref 0.00–0.07)
Basophils Absolute: 0 10*3/uL (ref 0.0–0.1)
Basophils Relative: 1 %
Eosinophils Absolute: 0.1 10*3/uL (ref 0.0–0.5)
Eosinophils Relative: 1 %
Immature Granulocytes: 0 %
Lymphocytes Relative: 45 %
Lymphs Abs: 2.6 10*3/uL (ref 0.7–4.0)
Monocytes Absolute: 0.3 10*3/uL (ref 0.1–1.0)
Monocytes Relative: 5 %
Neutro Abs: 2.7 10*3/uL (ref 1.7–7.7)
Neutrophils Relative %: 48 %

## 2020-04-05 LAB — POC SARS CORONAVIRUS 2 AG -  ED: SARS Coronavirus 2 Ag: NEGATIVE

## 2020-04-05 LAB — CBC
HCT: 44.6 % (ref 39.0–52.0)
Hemoglobin: 14 g/dL (ref 13.0–17.0)
MCH: 27.3 pg (ref 26.0–34.0)
MCHC: 31.4 g/dL (ref 30.0–36.0)
MCV: 87.1 fL (ref 80.0–100.0)
Platelets: 195 10*3/uL (ref 150–400)
RBC: 5.12 MIL/uL (ref 4.22–5.81)
RDW: 14.4 % (ref 11.5–15.5)
WBC: 5.7 10*3/uL (ref 4.0–10.5)
nRBC: 0 % (ref 0.0–0.2)

## 2020-04-05 LAB — CBG MONITORING, ED: Glucose-Capillary: 207 mg/dL — ABNORMAL HIGH (ref 70–99)

## 2020-04-05 MED ORDER — SODIUM CHLORIDE 0.9% FLUSH: 3.0000 mL | Freq: Once | INTRAVENOUS | Status: DC

## 2020-04-05 NOTE — ED Triage Notes (Signed)
Wife adds that pt hx stroke and focal seizures. Pt last seen normal was 2pm yesterday.

## 2020-04-05 NOTE — ED Triage Notes (Signed)
Pt brought in by wife from [place] walk in clinic. Pt had headache, dizziness and not feeling well yesterday all day. Wife states that last night around 10pm she noticed he couldn't open his pill bottles and put meds in container like his normal. Pt refused to go be seen last night. Pt had swelling to right side of face yesterday but better today. Pt denies headache in triage at this time. Pt states, "I feel better today".  Wife adds that patient had a cough for several weeks.  Pt was at [facility] for a scheduled ECHO and then went down to the clinic.

## 2020-04-05 NOTE — ED Provider Notes (Signed)
If UTI, abx. If no UTI[facility]. Has refused MRI for further eval for MS change. Physical Exam  BP 117/83   Pulse (!) 59   Temp 97.7 F (36.5 C) (Oral)   Resp 17   SpO2 96%   Physical Exam  ED Course/Procedures   Clinical Course as of Apr 05 1746  Thu Apr 05, 2020  1705 Results of the ED work-up discussed with the patient.  He is declining MRI.  We were informed that the MRI can be accomplished at [facility] only given his pacemaker.  However, with his pacemaker, patient is not comfortable getting an MRI.  He prefers outpatient follow-up.  Strict ER return precautions discussed with him and his wife.  UA is pending at this time, Dr. Johnney Killian will follow up.   [AN]    Clinical Course User Index [AN] Varney Biles, MD    Procedures  MDM  Patient and his family member updated on the plan.  Advised that urine culture has been obtained.  No antibiotics indicated at this time.  They voiced understanding and they are aware of the follow-up plan.       Charlesetta Shanks, MD 04/05/20 [phone]

## 2020-04-05 NOTE — ED Notes (Signed)
Discharge paperwork reviewed with pt and pts wife.  Pt verbalized understanding, with no questions or concerns at this time.  Pt wheeled to ED entrance.

## 2020-04-05 NOTE — ED Notes (Signed)
Pt provided urinal, informed of need for urine sample.  
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
## 2020-04-05 NOTE — ED Notes (Signed)
Per MRI, pt unable to undergo MRI at [facility] d/t pts pacemaker.  EDP Nanavati made aware.

## 2020-04-05 NOTE — ED Notes (Signed)
Family at bedside. 

## 2020-04-05 NOTE — Discharge Instructions (Addendum)
  All the results in the ER are normal, labs and imaging.  A urine culture has been done.  You will be called if you need antibiotics. We are not sure what is causing your symptoms. The workup in the ER is not complete, and is limited to screening for life threatening and emergent conditions only, so please see a primary care doctor for further evaluation.

## 2020-04-07 LAB — URINE CULTURE: Culture: <10000 — AB

## 2020-05-01 ENCOUNTER — Emergency Department (HOSPITAL_COMMUNITY): Payer: No Typology Code available for payment source

## 2020-05-01 ENCOUNTER — Emergency Department (HOSPITAL_COMMUNITY)
Admission: EM | Admit: 2020-05-01 | Discharge: 2020-05-01 | Disposition: A | Discharge status: Home / Self Care | Payer: No Typology Code available for payment source | Attending: Emergency Medicine | Admitting: Emergency Medicine

## 2020-05-01 ENCOUNTER — Other Ambulatory Visit: Payer: Self-pay

## 2020-05-01 ENCOUNTER — Encounter (HOSPITAL_COMMUNITY): Payer: Self-pay

## 2020-05-01 DIAGNOSIS — I119 Hypertensive heart disease without heart failure: Secondary | ICD-10-CM | POA: Insufficient documentation

## 2020-05-01 DIAGNOSIS — M25461 Effusion, right knee: Secondary | ICD-10-CM | POA: Insufficient documentation

## 2020-05-01 DIAGNOSIS — Z79899 Other long term (current) drug therapy: Secondary | ICD-10-CM | POA: Insufficient documentation

## 2020-05-01 DIAGNOSIS — M1711 Unilateral primary osteoarthritis, right knee: Secondary | ICD-10-CM | POA: Insufficient documentation

## 2020-05-01 DIAGNOSIS — E119 Type 2 diabetes mellitus without complications: Secondary | ICD-10-CM | POA: Insufficient documentation

## 2020-05-01 DIAGNOSIS — J449 Chronic obstructive pulmonary disease, unspecified: Secondary | ICD-10-CM | POA: Insufficient documentation

## 2020-05-01 DIAGNOSIS — R2241 Localized swelling, mass and lump, right lower limb: Secondary | ICD-10-CM | POA: Diagnosis present

## 2020-05-01 DIAGNOSIS — F17210 Nicotine dependence, cigarettes, uncomplicated: Secondary | ICD-10-CM | POA: Diagnosis not present

## 2020-05-01 DIAGNOSIS — Z7984 Long term (current) use of oral hypoglycemic drugs: Secondary | ICD-10-CM | POA: Insufficient documentation

## 2020-05-01 DIAGNOSIS — R0602 Shortness of breath: Secondary | ICD-10-CM | POA: Diagnosis not present

## 2020-05-01 LAB — COMPREHENSIVE METABOLIC PANEL
ALT: 19 U/L (ref 0–44)
AST: 15 U/L (ref 15–41)
Albumin: 4.2 g/dL (ref 3.5–5.0)
Alkaline Phosphatase: 137 U/L — ABNORMAL HIGH (ref 38–126)
Anion gap: 9 (ref 5–15)
BUN: 15 mg/dL (ref 8–23)
CO2: 27 mmol/L (ref 22–32)
Calcium: 9.1 mg/dL (ref 8.9–10.3)
Chloride: 100 mmol/L (ref 98–111)
Creatinine, Ser: 0.99 mg/dL (ref 0.61–1.24)
GFR calc Af Amer: >60 mL/min (ref >60)
GFR calc non Af Amer: >60 mL/min (ref >60)
Glucose, Bld: 146 mg/dL — ABNORMAL HIGH (ref 70–99)
Potassium: 3.8 mmol/L (ref 3.5–5.1)
Sodium: 136 mmol/L (ref 135–145)
Total Bilirubin: 0.5 mg/dL (ref 0.3–1.2)
Total Protein: 7.6 g/dL (ref 6.5–8.1)

## 2020-05-01 LAB — CBC WITH DIFFERENTIAL/PLATELET
Abs Immature Granulocytes: 0.03 10*3/uL (ref 0.00–0.07)
Basophils Absolute: 0 10*3/uL (ref 0.0–0.1)
Basophils Relative: 1 %
Eosinophils Absolute: 0.1 10*3/uL (ref 0.0–0.5)
Eosinophils Relative: 1 %
HCT: 44.2 % (ref 39.0–52.0)
Hemoglobin: 13.8 g/dL (ref 13.0–17.0)
Immature Granulocytes: 0 %
Lymphocytes Relative: 34 %
Lymphs Abs: 2.8 10*3/uL (ref 0.7–4.0)
MCH: 26.8 pg (ref 26.0–34.0)
MCHC: 31.2 g/dL (ref 30.0–36.0)
MCV: 85.8 fL (ref 80.0–100.0)
Monocytes Absolute: 0.5 10*3/uL (ref 0.1–1.0)
Monocytes Relative: 6 %
Neutro Abs: 4.7 10*3/uL (ref 1.7–7.7)
Neutrophils Relative %: 58 %
Platelets: 196 10*3/uL (ref 150–400)
RBC: 5.15 MIL/uL (ref 4.22–5.81)
RDW: 14.4 % (ref 11.5–15.5)
WBC: 8.2 10*3/uL (ref 4.0–10.5)
nRBC: 0 % (ref 0.0–0.2)

## 2020-05-01 MED ORDER — TRAMADOL HCL 50 MG PO TABS
50.0000 mg | ORAL_TABLET | Freq: Once | ORAL | Status: AC
  Administered 2020-05-01: 50 mg via ORAL
  Filled 2020-05-01: qty 1

## 2020-05-01 MED ORDER — IBUPROFEN 600 MG PO TABS
600.0000 mg | ORAL_TABLET | Freq: Four times a day (QID) | ORAL | 0 refills | Status: DC | PRN
Start: 2020-05-01 — End: 2020-08-17

## 2020-05-01 MED ORDER — TRAMADOL HCL 50 MG PO TABS: 50.0000 mg | ORAL_TABLET | Freq: Four times a day (QID) | ORAL | 0 refills | Status: DC | PRN

## 2020-05-01 NOTE — Discharge Instructions (Signed)
Take motrin for pain   Take tramadol for severe pain   You have arthritis in your knee that is causing the swelling of the knee.  Use knee sleeves for comfort.  Follow-up with orthopedic doctor.  Return to ER if you have worse knee pain, chest pain, trouble breathing.

## 2020-05-01 NOTE — ED Triage Notes (Signed)
Patient c/o right leg swelling x 4 days. Patient's wife called the [place] today and was told to come to the ED for further evaluation for possible blood clot.  Patient's wife states that the patient had a seizure last week and reports that he has not his usually self since the seizure.

## 2020-05-01 NOTE — ED Notes (Signed)
Pt requesting pain medication.  EDP Darl Householder made aware.
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
## 2020-05-03 ENCOUNTER — Ambulatory Visit: Payer: No Typology Code available for payment source | Admitting: Surgery

## 2020-07-16 ENCOUNTER — Encounter (HOSPITAL_COMMUNITY): Payer: Self-pay

## 2020-07-16 ENCOUNTER — Emergency Department (HOSPITAL_COMMUNITY)
Admission: EM | Admit: 2020-07-16 | Discharge: 2020-07-16 | Disposition: A | Discharge status: Home / Self Care | Payer: No Typology Code available for payment source | Attending: Emergency Medicine | Admitting: Emergency Medicine

## 2020-07-16 DIAGNOSIS — Z79899 Other long term (current) drug therapy: Secondary | ICD-10-CM | POA: Diagnosis not present

## 2020-07-16 DIAGNOSIS — R531 Weakness: Secondary | ICD-10-CM | POA: Insufficient documentation

## 2020-07-16 DIAGNOSIS — J449 Chronic obstructive pulmonary disease, unspecified: Secondary | ICD-10-CM | POA: Diagnosis not present

## 2020-07-16 DIAGNOSIS — Z955 Presence of coronary angioplasty implant and graft: Secondary | ICD-10-CM | POA: Insufficient documentation

## 2020-07-16 DIAGNOSIS — E119 Type 2 diabetes mellitus without complications: Secondary | ICD-10-CM | POA: Insufficient documentation

## 2020-07-16 DIAGNOSIS — Z7951 Long term (current) use of inhaled steroids: Secondary | ICD-10-CM | POA: Diagnosis not present

## 2020-07-16 DIAGNOSIS — Z950 Presence of cardiac pacemaker: Secondary | ICD-10-CM | POA: Insufficient documentation

## 2020-07-16 DIAGNOSIS — I10 Essential (primary) hypertension: Secondary | ICD-10-CM | POA: Diagnosis not present

## 2020-07-16 DIAGNOSIS — I2510 Atherosclerotic heart disease of native coronary artery without angina pectoris: Secondary | ICD-10-CM | POA: Diagnosis not present

## 2020-07-16 DIAGNOSIS — F0390 Unspecified dementia without behavioral disturbance: Secondary | ICD-10-CM | POA: Diagnosis not present

## 2020-07-16 DIAGNOSIS — Z85528 Personal history of other malignant neoplasm of kidney: Secondary | ICD-10-CM | POA: Insufficient documentation

## 2020-07-16 DIAGNOSIS — R55 Syncope and collapse: Secondary | ICD-10-CM | POA: Diagnosis not present

## 2020-07-16 DIAGNOSIS — F17210 Nicotine dependence, cigarettes, uncomplicated: Secondary | ICD-10-CM | POA: Insufficient documentation

## 2020-07-16 DIAGNOSIS — R739 Hyperglycemia, unspecified: Secondary | ICD-10-CM

## 2020-07-16 DIAGNOSIS — E1165 Type 2 diabetes mellitus with hyperglycemia: Secondary | ICD-10-CM | POA: Diagnosis present

## 2020-07-16 LAB — CBG MONITORING, ED
Glucose-Capillary: 447 mg/dL — ABNORMAL HIGH (ref 70–99)
Glucose-Capillary: 336 mg/dL — ABNORMAL HIGH (ref 70–99)
Glucose-Capillary: 289 mg/dL — ABNORMAL HIGH (ref 70–99)

## 2020-07-16 LAB — URINALYSIS, ROUTINE W REFLEX MICROSCOPIC
Bacteria, UA: NONE SEEN
Bilirubin Urine: NEGATIVE
Glucose, UA: >=500 mg/dL — AB
Ketones, ur: NEGATIVE mg/dL
Leukocytes,Ua: NEGATIVE
Nitrite: NEGATIVE
Protein, ur: NEGATIVE mg/dL
RBC / HPF: >50 RBC/hpf — ABNORMAL HIGH (ref 0–5)
Specific Gravity, Urine: 1.03 (ref 1.005–1.030)
pH: 5 (ref 5.0–8.0)

## 2020-07-16 LAB — CBC
HCT: 48.3 % (ref 39.0–52.0)
Hemoglobin: 15.6 g/dL (ref 13.0–17.0)
MCH: 27.4 pg (ref 26.0–34.0)
MCHC: 32.3 g/dL (ref 30.0–36.0)
MCV: 84.9 fL (ref 80.0–100.0)
Platelets: 234 10*3/uL (ref 150–400)
RBC: 5.69 MIL/uL (ref 4.22–5.81)
RDW: 14.2 % (ref 11.5–15.5)
WBC: 9.4 10*3/uL (ref 4.0–10.5)
nRBC: 0 % (ref 0.0–0.2)

## 2020-07-16 LAB — BASIC METABOLIC PANEL
Anion gap: 14 (ref 5–15)
BUN: 25 mg/dL — ABNORMAL HIGH (ref 8–23)
CO2: 23 mmol/L (ref 22–32)
Calcium: 9.6 mg/dL (ref 8.9–10.3)
Chloride: 98 mmol/L (ref 98–111)
Creatinine, Ser: 1.24 mg/dL (ref 0.61–1.24)
GFR calc Af Amer: >60 mL/min (ref >60)
GFR calc non Af Amer: >60 mL/min (ref >60)
Glucose, Bld: 394 mg/dL — ABNORMAL HIGH (ref 70–99)
Potassium: 4.5 mmol/L (ref 3.5–5.1)
Sodium: 135 mmol/L (ref 135–145)

## 2020-07-16 MED ORDER — SODIUM CHLORIDE 0.9 % IV BOLUS
1000.0000 mL | Freq: Once | INTRAVENOUS | Status: AC
  Administered 2020-07-16: 1000 mL via INTRAVENOUS

## 2020-07-16 NOTE — ED Triage Notes (Signed)
Patient reports he had syncope episode and fell off the porch. Patient states his CBG yesterday was 500.   Patient reports scratches on right hand and left elbow pain after falling off porch.   Patient states he nurse came out this morning to check his cbg and in 400's.    A/Ox4 Wheelchair in triage.   Pt states he takes 40 of insulin in the morning and 25 of insulin at night.  Takes metformin every day. Patient reports he has been compliant with meds.
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
## 2020-07-31 ENCOUNTER — Other Ambulatory Visit: Payer: Self-pay | Admitting: Urology

## 2020-08-01 NOTE — Patient Instructions (Addendum)
DUE TO COVID-19 ONLY ONE VISITOR IS ALLOWED TO COME WITH YOU AND STAY IN THE WAITING ROOM ONLY DURING PRE OP AND PROCEDURE DAY OF SURGERY. THE 1 VISITOR  MAY VISIT WITH YOU AFTER SURGERY IN YOUR PRIVATE ROOM DURING VISITING HOURS ONLY!  YOU NEED TO HAVE A COVID 19 TEST ON___8-31-21____ @_______ , THIS TEST MUST BE DONE BEFORE SURGERY,    COVID TESTING SITE [address] [place] [address],   IT IS ON THE RIGHT GOING OUT [address] APPROXIMATELY  2 MINUTES PAST [employer] ON THE RIGHT. ONCE YOUR COVID TEST IS COMPLETED,  PLEASE BEGIN THE QUARANTINE INSTRUCTIONS AS OUTLINED IN YOUR HANDOUT.                Wesley [name]  08/01/2020   Your procedure is scheduled on: 08-17-20   Report to [facility] Main  Entrance   Report to admitting at      0700 AM     Call this number if you have problems the morning of surgery [phone]    Remember: Do not eat food  :After Midnight. You may have clear liquids until 0600 am then nothing by mouth .    CLEAR LIQUID DIET   Foods Allowed                                                                                          Foods Excluded  Coffee and tea, regular and decaf   NO CREAMER                               liquids that you cannot  Plain Jell-O any favor except red or purple                                           see through such as: Fruit ices (not with fruit pulp)                                                                milk, soups, orange juice  Iced Popsicles                                                                  All solid food Carbonated beverages, regular and diet                                    Cranberry, grape and apple juices Sports drinks like Gatorade  Lightly seasoned clear broth or consume(fat free) Sugar, honey syrup    _____________________________________________________________________    BRUSH YOUR TEETH MORNING OF SURGERY AND RINSE YOUR MOUTH OUT, NO  CHEWING GUM CANDY OR MINTS.     Take these medicines the morning of surgery with A SIP OF WATER: Inhalers and bring, protonix, eye drops as usual, Keppra, hydralazine, gabapentin, finasteride, dilitiazem  DAY BEFORE SURGERY TAKE ONLY 70%  (21 UNITS) OF YOUR PM DOSE OF NOVOLOG 70/30 AND NONE DAY OF SURGERY DO NOT TAKE ANY METFORMIN  DAY OF YOUR SURGERY                               You may not have any metal on your body including hair pins and              piercings  Do not wear jewelry,   lotions, powders or perfumes, deodorant                       Men may shave face and neck.   Do not bring valuables to the hospital. [place].  Contacts, dentures or bridgework may not be worn into surgery.        Patients discharged the day of surgery will not be allowed to drive home. IF YOU ARE HAVING SURGERY AND GOING HOME THE SAME DAY, YOU MUST HAVE AN ADULT TO DRIVE YOU HOME AND BE WITH YOU FOR 24 HOURS. YOU MAY GO HOME BY TAXI OR UBER OR ORTHERWISE, BUT AN ADULT MUST ACCOMPANY YOU HOME AND STAY WITH YOU FOR 24 HOURS.  Name and phone number of your driver:  Special Instructions: N/A              Please read over the following fact sheets you were given: _____________________________________________________________________             [facility] - Preparing for Surgery Before surgery, you can play an important role.  Because skin is not sterile, your skin needs to be as free of germs as possible.  You can reduce the number of germs on your skin by washing with CHG (chlorahexidine gluconate) soap before surgery.  CHG is an antiseptic cleaner which kills germs and bonds with the skin to continue killing germs even after washing. Please DO NOT use if you have an allergy to CHG or antibacterial soaps.  If your skin becomes reddened/irritated stop using the CHG and inform your nurse when you arrive at Short Stay. Do not shave (including legs and  underarms) for at least 48 hours prior to the first CHG shower.  You may shave your face/neck. Please follow these instructions carefully:  1.  Shower with CHG Soap the night before surgery and the  morning of Surgery.  2.  If you choose to wash your hair, wash your hair first as usual with your  normal  shampoo.  3.  After you shampoo, rinse your hair and body thoroughly to remove the  shampoo.                           4.  Use CHG as you would any other liquid soap.  You can apply chg directly  to the skin and wash  Gently with a scrungie or clean washcloth.  5.  Apply the CHG Soap to your body ONLY FROM THE NECK DOWN.   Do not use on face/ open                           Wound or open sores. Avoid contact with eyes, ears mouth and genitals (private parts).                       Wash face,  Genitals (private parts) with your normal soap.             6.  Wash thoroughly, paying special attention to the area where your surgery  will be performed.  7.  Thoroughly rinse your body with warm water from the neck down.  8.  DO NOT shower/wash with your normal soap after using and rinsing off  the CHG Soap.                9.  Pat yourself dry with a clean towel.            10.  Wear clean pajamas.            11.  Place clean sheets on your bed the night of your first shower and do not  sleep with pets. Day of Surgery : Do not apply any lotions/deodorants the morning of surgery.  Please wear clean clothes to the hospital/surgery center.  FAILURE TO FOLLOW THESE INSTRUCTIONS MAY RESULT IN THE CANCELLATION OF YOUR SURGERY PATIENT SIGNATURE_________________________________  NURSE SIGNATURE__________________________________  ________________________________________________________________________

## 2020-08-06 NOTE — Progress Notes (Signed)
Hiram Comber from [place] device [facility] called and left message, please fax all device orders to [phone] and device clinic phone number is [phone] extension [phone].

## 2020-08-09 ENCOUNTER — Encounter (HOSPITAL_COMMUNITY)
Admission: RE | Admit: 2020-08-09 | Discharge: 2020-08-09 | Disposition: A | Discharge status: Home / Self Care | Payer: No Typology Code available for payment source | Source: Ambulatory Visit | Attending: Urology | Admitting: Urology

## 2020-08-09 ENCOUNTER — Encounter (HOSPITAL_COMMUNITY): Payer: Self-pay

## 2020-08-09 ENCOUNTER — Other Ambulatory Visit: Payer: Self-pay

## 2020-08-09 DIAGNOSIS — C763 Malignant neoplasm of pelvis: Secondary | ICD-10-CM | POA: Diagnosis not present

## 2020-08-09 DIAGNOSIS — Z01812 Encounter for preprocedural laboratory examination: Secondary | ICD-10-CM | POA: Diagnosis present

## 2020-08-09 HISTORY — DX: Myoneural disorder, unspecified: G70.9

## 2020-08-09 LAB — BASIC METABOLIC PANEL
Anion gap: 10 (ref 5–15)
BUN: 16 mg/dL (ref 8–23)
CO2: 25 mmol/L (ref 22–32)
Calcium: 9.3 mg/dL (ref 8.9–10.3)
Chloride: 102 mmol/L (ref 98–111)
Creatinine, Ser: 1.07 mg/dL (ref 0.61–1.24)
GFR calc Af Amer: >60 mL/min (ref >60)
GFR calc non Af Amer: >60 mL/min (ref >60)
Glucose, Bld: 150 mg/dL — ABNORMAL HIGH (ref 70–99)
Potassium: 4 mmol/L (ref 3.5–5.1)
Sodium: 137 mmol/L (ref 135–145)

## 2020-08-09 LAB — HEMOGLOBIN A1C
Hgb A1c MFr Bld: 10.3 % — ABNORMAL HIGH (ref 4.8–5.6)
Mean Plasma Glucose: 248.91 mg/dL

## 2020-08-09 LAB — CBC
HCT: 46.8 % (ref 39.0–52.0)
Hemoglobin: 14.7 g/dL (ref 13.0–17.0)
MCH: 27.1 pg (ref 26.0–34.0)
MCHC: 31.4 g/dL (ref 30.0–36.0)
MCV: 86.3 fL (ref 80.0–100.0)
Platelets: 221 10*3/uL (ref 150–400)
RBC: 5.42 MIL/uL (ref 4.22–5.81)
RDW: 14.3 % (ref 11.5–15.5)
WBC: 7.3 10*3/uL (ref 4.0–10.5)
nRBC: 0 % (ref 0.0–0.2)

## 2020-08-09 LAB — GLUCOSE, CAPILLARY: Glucose-Capillary: 157 mg/dL — ABNORMAL HIGH (ref 70–99)

## 2020-08-09 NOTE — Progress Notes (Addendum)
PCP - [facility]. Evern [facility] - [facility]   PPM/ICD - Pacemaker Device Orders - on chart Rep Notified -yes Windle Guard [phone] LAST DEVICE CHECK 03-21-20 ON CHART  Chest x-ray - 05-01-20 epic EKG - 07-16-20 epic, 03-21-20 on chart Stress Test -  ECHO - 08-31-19 epic, 04-05-20 on chart  Cardiac Cath -   Sleep Study -  CPAP - NO  Fasting Blood Sugar -200-300 Checks Blood Sugar ___2__ times a day  Blood Thinner Instructions: Aspirin Instructions:325 asa no need to stop per Dr. Tresa  per pt.  ERAS Protcol -no PRE-SURGERY Ensure or G2-   COVID TEST- 8-31  Activity- SOB any distance not new for pt COPD and emphysema  Anesthesia review: COPD  OSA does not tolerate cpap, Stroke weakness right side, MI 2015,  DM, hgba1c 10.3  Patient denies shortness of breath, fever, cough and chest pain at PAT appointment  NONE   All instructions explained to the patient, with a verbal understanding of the material. Patient agrees to go over the instructions while at home for a better understanding. Patient also instructed to self quarantine after being tested for COVID-19. The opportunity to ask questions was provided.

## 2020-08-10 NOTE — Progress Notes (Signed)
LVM x2 with  Dr. Tresa  scheduler for clearance to be faxed over .

## 2020-08-14 ENCOUNTER — Other Ambulatory Visit (HOSPITAL_COMMUNITY)
Admission: RE | Admit: 2020-08-14 | Discharge: 2020-08-14 | Disposition: A | Discharge status: Home / Self Care | Payer: No Typology Code available for payment source | Source: Ambulatory Visit | Attending: Urology | Admitting: Urology

## 2020-08-14 DIAGNOSIS — Z01812 Encounter for preprocedural laboratory examination: Secondary | ICD-10-CM | POA: Insufficient documentation

## 2020-08-14 DIAGNOSIS — Z20822 Contact with and (suspected) exposure to covid-19: Secondary | ICD-10-CM | POA: Diagnosis not present

## 2020-08-14 LAB — SARS CORONAVIRUS 2 (TAT 6-24 HRS): SARS Coronavirus 2: NEGATIVE
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
## 2020-08-16 MED ORDER — GENTAMICIN SULFATE 40 MG/ML IJ SOLN
5.0000 mg/kg | INTRAVENOUS | Status: AC
  Administered 2020-08-17: 470 mg via INTRAVENOUS
  Filled 2020-08-16: qty 11.75

## 2020-08-17 ENCOUNTER — Ambulatory Visit (HOSPITAL_COMMUNITY)
Admission: RE | Admit: 2020-08-17 | Discharge: 2020-08-17 | Disposition: A | Discharge status: Home / Self Care | Payer: Medicaid Other | Source: Other Acute Inpatient Hospital | Attending: Urology | Admitting: Urology

## 2020-08-17 ENCOUNTER — Encounter (HOSPITAL_COMMUNITY)
Admission: RE | Disposition: A | Discharge status: Home / Self Care | Payer: Self-pay | Source: Other Acute Inpatient Hospital | Attending: Urology

## 2020-08-17 ENCOUNTER — Encounter (HOSPITAL_COMMUNITY): Payer: Self-pay | Admitting: Urology

## 2020-08-17 ENCOUNTER — Ambulatory Visit (HOSPITAL_COMMUNITY): Payer: Medicaid Other | Admitting: Physician Assistant

## 2020-08-17 ENCOUNTER — Ambulatory Visit (HOSPITAL_COMMUNITY): Payer: Medicaid Other | Admitting: Certified Registered Nurse Anesthetist

## 2020-08-17 ENCOUNTER — Ambulatory Visit (HOSPITAL_COMMUNITY): Payer: Medicaid Other

## 2020-08-17 DIAGNOSIS — I2510 Atherosclerotic heart disease of native coronary artery without angina pectoris: Secondary | ICD-10-CM | POA: Insufficient documentation

## 2020-08-17 DIAGNOSIS — I69354 Hemiplegia and hemiparesis following cerebral infarction affecting left non-dominant side: Secondary | ICD-10-CM | POA: Insufficient documentation

## 2020-08-17 DIAGNOSIS — C652 Malignant neoplasm of left renal pelvis: Secondary | ICD-10-CM | POA: Diagnosis not present

## 2020-08-17 DIAGNOSIS — M1990 Unspecified osteoarthritis, unspecified site: Secondary | ICD-10-CM | POA: Insufficient documentation

## 2020-08-17 DIAGNOSIS — Z8553 Personal history of malignant neoplasm of renal pelvis: Secondary | ICD-10-CM | POA: Diagnosis present

## 2020-08-17 DIAGNOSIS — Z794 Long term (current) use of insulin: Secondary | ICD-10-CM | POA: Diagnosis not present

## 2020-08-17 DIAGNOSIS — Z950 Presence of cardiac pacemaker: Secondary | ICD-10-CM | POA: Diagnosis not present

## 2020-08-17 DIAGNOSIS — R569 Unspecified convulsions: Secondary | ICD-10-CM | POA: Insufficient documentation

## 2020-08-17 DIAGNOSIS — I252 Old myocardial infarction: Secondary | ICD-10-CM | POA: Insufficient documentation

## 2020-08-17 DIAGNOSIS — E119 Type 2 diabetes mellitus without complications: Secondary | ICD-10-CM | POA: Diagnosis not present

## 2020-08-17 DIAGNOSIS — F0390 Unspecified dementia without behavioral disturbance: Secondary | ICD-10-CM | POA: Insufficient documentation

## 2020-08-17 DIAGNOSIS — F17210 Nicotine dependence, cigarettes, uncomplicated: Secondary | ICD-10-CM | POA: Insufficient documentation

## 2020-08-17 DIAGNOSIS — G4730 Sleep apnea, unspecified: Secondary | ICD-10-CM | POA: Insufficient documentation

## 2020-08-17 DIAGNOSIS — I10 Essential (primary) hypertension: Secondary | ICD-10-CM | POA: Diagnosis not present

## 2020-08-17 HISTORY — PX: CYSTOSCOPY/RETROGRADE/URETEROSCOPY: SHX5316

## 2020-08-17 HISTORY — PX: HOLMIUM LASER APPLICATION: SHX5852

## 2020-08-17 LAB — GLUCOSE, CAPILLARY
Glucose-Capillary: 147 mg/dL — ABNORMAL HIGH (ref 70–99)
Glucose-Capillary: 165 mg/dL — ABNORMAL HIGH (ref 70–99)

## 2020-08-17 SURGERY — CYSTOSCOPY/RETROGRADE/URETEROSCOPY
Anesthesia: General | Laterality: Left

## 2020-08-17 MED ORDER — PROPOFOL 10 MG/ML IV BOLUS
INTRAVENOUS | Status: AC
  Filled 2020-08-17: qty 20

## 2020-08-17 MED ORDER — EPHEDRINE SULFATE-NACL 50-0.9 MG/10ML-% IV SOSY
PREFILLED_SYRINGE | INTRAVENOUS | Status: DC | PRN
  Administered 2020-08-17: 15 mg via INTRAVENOUS

## 2020-08-17 MED ORDER — PHENYLEPHRINE 40 MCG/ML (10ML) SYRINGE FOR IV PUSH (FOR BLOOD PRESSURE SUPPORT)
PREFILLED_SYRINGE | INTRAVENOUS | Status: DC | PRN
  Administered 2020-08-17: 160 ug via INTRAVENOUS

## 2020-08-17 MED ORDER — SODIUM CHLORIDE 0.9 % IR SOLN
Status: DC | PRN
  Administered 2020-08-17: 3000 mL

## 2020-08-17 MED ORDER — LACTATED RINGERS IV SOLN: INTRAVENOUS | Status: DC

## 2020-08-17 MED ORDER — PHENYLEPHRINE HCL-NACL 10-0.9 MG/250ML-% IV SOLN
INTRAVENOUS | Status: DC | PRN
  Administered 2020-08-17: 35 ug/min via INTRAVENOUS

## 2020-08-17 MED ORDER — FENTANYL CITRATE (PF) 100 MCG/2ML IJ SOLN
INTRAMUSCULAR | Status: AC
  Filled 2020-08-17: qty 2

## 2020-08-17 MED ORDER — FENTANYL CITRATE (PF) 100 MCG/2ML IJ SOLN: 25.0000 ug | INTRAMUSCULAR | Status: DC | PRN

## 2020-08-17 MED ORDER — ONDANSETRON HCL 4 MG/2ML IJ SOLN
INTRAMUSCULAR | Status: DC | PRN
  Administered 2020-08-17: 4 mg via INTRAVENOUS

## 2020-08-17 MED ORDER — ORAL CARE MOUTH RINSE: 15.0000 mL | Freq: Once | OROMUCOSAL | Status: AC

## 2020-08-17 MED ORDER — MIDAZOLAM HCL 5 MG/5ML IJ SOLN
INTRAMUSCULAR | Status: DC | PRN
  Administered 2020-08-17 (×2): 1 mg via INTRAVENOUS

## 2020-08-17 MED ORDER — SODIUM CHLORIDE 0.9 % IR SOLN
Status: DC | PRN
  Administered 2020-08-17: 1000 mL

## 2020-08-17 MED ORDER — CHLORHEXIDINE GLUCONATE 0.12 % MT SOLN
15.0000 mL | Freq: Once | OROMUCOSAL | Status: AC
  Administered 2020-08-17: 15 mL via OROMUCOSAL

## 2020-08-17 MED ORDER — FENTANYL CITRATE (PF) 100 MCG/2ML IJ SOLN
INTRAMUSCULAR | Status: DC | PRN
Start: 2020-08-17 — End: 2020-08-17
  Administered 2020-08-17 (×3): 12.5 ug via INTRAVENOUS
  Administered 2020-08-17: 25 ug via INTRAVENOUS
  Administered 2020-08-17 (×2): 12.5 ug via INTRAVENOUS

## 2020-08-17 MED ORDER — LIDOCAINE 2% (20 MG/ML) 5 ML SYRINGE
INTRAMUSCULAR | Status: DC | PRN
  Administered 2020-08-17: 60 mg via INTRAVENOUS

## 2020-08-17 MED ORDER — IOHEXOL 300 MG/ML  SOLN
INTRAMUSCULAR | Status: DC | PRN
  Administered 2020-08-17: 32 mL

## 2020-08-17 MED ORDER — EPHEDRINE 5 MG/ML INJ
INTRAVENOUS | Status: AC
  Filled 2020-08-17: qty 10

## 2020-08-17 MED ORDER — TRAMADOL HCL 50 MG PO TABS
50.0000 mg | ORAL_TABLET | Freq: Four times a day (QID) | ORAL | 0 refills | Status: DC | PRN
Start: 2020-08-17 — End: 2020-09-21

## 2020-08-17 MED ORDER — MIDAZOLAM HCL 2 MG/2ML IJ SOLN
INTRAMUSCULAR | Status: AC
  Filled 2020-08-17: qty 2

## 2020-08-17 MED ORDER — PROPOFOL 10 MG/ML IV BOLUS
INTRAVENOUS | Status: DC | PRN
  Administered 2020-08-17: 130 mg via INTRAVENOUS

## 2020-08-17 MED ORDER — ONDANSETRON HCL 4 MG/2ML IJ SOLN
INTRAMUSCULAR | Status: AC
  Filled 2020-08-17: qty 2

## 2020-08-17 MED ORDER — SODIUM CHLORIDE 0.9 % IV SOLN
750.0000 mg | Freq: Once | INTRAVENOUS | Status: AC
  Administered 2020-08-17: 750 mg via INTRAVENOUS
  Filled 2020-08-17: qty 7.5

## 2020-08-17 MED ORDER — LIDOCAINE 2% (20 MG/ML) 5 ML SYRINGE
INTRAMUSCULAR | Status: AC
  Filled 2020-08-17: qty 5

## 2020-08-17 SURGICAL SUPPLY — 28 items
BAG URO CATCHER STRL LF (MISCELLANEOUS) ×4 IMPLANT
BASKET LASER NITINOL 1.9FR (BASKET) IMPLANT
CATH INTERMIT  6FR 70CM (CATHETERS) ×4 IMPLANT
CLOTH BEACON ORANGE TIMEOUT ST (SAFETY) ×4 IMPLANT
DRSG TELFA 3X8 NADH (GAUZE/BANDAGES/DRESSINGS) ×4 IMPLANT
EXTRACTOR STONE 1.7FRX115CM (UROLOGICAL SUPPLIES) IMPLANT
FORCEPS BIOP 2.4F 115CM BACKLD (INSTRUMENTS) ×4 IMPLANT
GLOVE BIOGEL M STRL SZ7.5 (GLOVE) ×4 IMPLANT
GOWN STRL REUS W/TWL LRG LVL3 (GOWN DISPOSABLE) ×4 IMPLANT
GUIDEWIRE ANG ZIPWIRE 038X150 (WIRE) ×4 IMPLANT
GUIDEWIRE STR DUAL SENSOR (WIRE) ×4 IMPLANT
KIT TURNOVER KIT A (KITS) IMPLANT
LASER FIB FLEXIVA PULSE ID 365 (Laser) IMPLANT
LASER FIB FLEXIVA PULSE ID 550 (Laser) IMPLANT
LASER FIB FLEXIVA PULSE ID 910 (Laser) IMPLANT
MANIFOLD NEPTUNE II (INSTRUMENTS) ×4 IMPLANT
NEEDLE HYPO 22GX1.5 SAFETY (NEEDLE) ×4 IMPLANT
PACK CYSTO (CUSTOM PROCEDURE TRAY) ×4 IMPLANT
SHEATH URETERAL 12FRX28CM (UROLOGICAL SUPPLIES) IMPLANT
SHEATH URETERAL 12FRX35CM (MISCELLANEOUS) ×4 IMPLANT
STENT POLARIS 5FRX26 (STENTS) ×4 IMPLANT
SYR 20ML LL LF (SYRINGE) ×4 IMPLANT
TRACTIP FLEXIVA PULS ID 200XHI (Laser) ×2 IMPLANT
TRACTIP FLEXIVA PULSE ID 200 (Laser) ×4
TUBE FEEDING 8FR 16IN STR KANG (MISCELLANEOUS) ×4 IMPLANT
TUBING CONNECTING 10 (TUBING) ×3 IMPLANT
TUBING CONNECTING 10' (TUBING) ×1
TUBING UROLOGY SET (TUBING) ×4 IMPLANT

## 2020-08-17 NOTE — Transfer of Care (Signed)
Immediate Anesthesia Transfer of Care Note  Patient: Wesley [name]  Procedure(s) Performed: CYSTOSCOPY/BILATERAL RETROGRADE/ LEFT URETEROSCOPY WITH BIOPSY AND ABLATION OF TUMOR (Bilateral ) HOLMIUM LASER APPLICATION (Left )  Patient Location: PACU  Anesthesia Type:General  Level of Consciousness: drowsy and patient cooperative  Airway & Oxygen Therapy: Patient Spontanous Breathing and Patient connected to face mask oxygen  Post-op Assessment: Report given to RN and Post -op Vital signs reviewed and stable  Post vital signs: Reviewed and stable  Last Vitals:  Vitals Value Taken Time  BP    Temp    Pulse 68 08/17/20 1005  Resp 12 08/17/20 1005  SpO2 100 % 08/17/20 1005  Vitals shown include unvalidated device data.  Last Pain:  Vitals:   08/17/20 0735  TempSrc:   PainSc: 0-No pain         Complications: No complications documented.

## 2020-08-17 NOTE — Op Note (Signed)
NAME: Wesley [name], MALEK MEDICAL RECORD DJ:[id number] ACCOUNT [id number] DATE OF BIRTH:07-Aug-1956 [facility] LOCATION: [facility] PHYSICIAN:Treyveon Mochizuki Tresa Moore, MD  OPERATIVE REPORT  DATE OF PROCEDURE:  08/17/2020  SURGEON:  Alexis Frock, MD  PREOPERATIVE DIAGNOSIS:  History of left renal pelvis cancer.  POSTOPERATIVE DIAGNOSIS:  History of left renal pelvis cancer, plus recurrence.  PROCEDURE: 1.  Cystoscopy, bilateral retrograde pyelograms, interpretation. 2.  Left ureteroscopy with biopsy and ablation of renal pelvis tumor, first-stage. 3.  Insertion of left ureteral stent, 5 x 26 Polaris, no tether.  ESTIMATED BLOOD LOSS:  Nil.  COMPLICATIONS:  None.  SPECIMENS:  Left upper pole renal pelvis tumor fragments for permanent pathology.  FINDINGS: 1.  Unremarkable bilateral retrograde pyelograms. 2.  Localizing left ureteral hematuria. 3.  Approximately 2 cm surface area recurrence volume in left upper pole calix,  papillary renal tumor. 4.  Ablation estimated of 70% to 80% of the tumor. 5.  Successful placement of left ureteral stent, proximal end in renal pelvis, distal end in urinary bladder.  INDICATIONS:  This is a pleasant 64 year old African-American man with a history of left renal pelvis tumor.  He has been very compliant with surveillance after initial treatment of his tumor approximately 18 months ago.  He is due for surveillance  ureteroscopy.  Informed consent was obtained and placed in the medical record.    DESCRIPTION OF PROCEDURE:  The patient being identified, the procedure being cystoscopy, retrogrades and left diagnostic ureteroscopy was confirmed.  Procedure timeout was performed.  Intravenous antibiotics administered.  General anesthesia induced.   The patient was placed into a low lithotomy position, sterile field was created, prepped and draped base of the penis, perineum and proximal thighs using iodine.  Cystourethroscopy was performed using a 21-French  rigid cystoscope with offset lens.   Anterior and posterior urethra were unremarkable.  Inspection of the bladder revealed ureteral orifices singleton bilaterally.  There appeared to be some localizing hematuria from the left ureteral orifice.  The right ureteral orifice was cannulated with  a 6-French renal catheter and right retrograde pyelogram was obtained.  Right retrograde pyelogram demonstrated a single right ureter with single system right kidney.  No filling defects or narrowing noted.  Next, left retrograde pyelogram was obtained.  Left retrograde pyelogram demonstrated a single left ureter, single system left kidney.  There was questionable filling defect in the upper pole calix.  A 0.03 ZIPwire was advanced to lower pole and set aside as a safety wire.  An 8-French feeding tube  was placed in the urinary bladder for pressure release.  A semirigid ureteroscopy was performed in distal four-fifths of the left ureter alongside a separate sensor working wire.  No significant mucosal abnormalities were found.  The semirigid scope was  exchanged for a 12/14 medium length ureteral access sheath to the level of the proximal ureter and flexible digital ureteroscopy was performed of the proximal ureter and systematic inspection of the left kidney, including all calices x3.  Inspection of  the  left kidney did reveal an approximately 2 cm volume papillary tumor at the upper pole calix.  Photodocumentation performed.  Otherwise kidney was unremarkable.  To help rule out high-grade transformation the BIGopsy biopsy apparatus was backloaded  and representative biopsies were taken of the papillary tissue, set aside for permanent pathology.  Then laser ablation was performed of the tumor with a 245 mm tumor, 1 joule and 5 Hz and ablation was performed of the papillary tissue down to the  superficial fibromuscular stroma  of the upper pole tissue.  It was estimated at least 80% volume ablated.  Given the  inherent shaggy nature of the tissue, it was unclear if this was a complete resection or not.  We achieved the goals of this today.  It  was clearly felt that a second-stage procedure in approximately 4-6 weeks would be warranted to help maximally verify resolution of papillary tissue pending his pathology.  Access sheath was removed under continuous vision.  No significant mucosal  abnormalities were found, and a new 5 x 26 Polaris-type stent was placed over remaining safety wire using fluoroscopic guidance.  Good proximal and distal planes were noted.  The procedure was terminated.  The patient tolerated the procedure well.  No  immediate perioperative complications.  The patient was taken to postanesthesia care in stable condition.  Plan for discharge home.  VN/NUANCE  D:08/17/2020 T:08/17/2020 JOB:[id number]

## 2020-08-17 NOTE — Anesthesia Procedure Notes (Signed)
Procedure Name: LMA Insertion Date/Time: 08/17/2020 8:51 AM Performed by: Maxwell Caul, CRNA Pre-anesthesia Checklist: Patient identified, Emergency Drugs available, Suction available and Patient being monitored Patient Re-evaluated:Patient Re-evaluated prior to induction Oxygen Delivery Method: Circle system utilized Preoxygenation: Pre-oxygenation with 100% oxygen Induction Type: IV induction LMA: LMA inserted LMA Size: 4.0 Number of attempts: 1 Placement Confirmation: positive ETCO2 and breath sounds checked- equal and bilateral Tube secured with: Tape Dental Injury: Teeth and Oropharynx as per pre-operative assessment

## 2020-08-17 NOTE — Anesthesia Preprocedure Evaluation (Addendum)
Anesthesia Evaluation  Patient identified by MRN, date of birth, ID band Patient awake    Reviewed: Allergy & Precautions, NPO status , Patient's Chart, lab work & pertinent test results  Airway Mallampati: II  TM Distance: >3 FB Neck ROM: Full    Dental no notable dental hx. (+) Poor Dentition, Chipped, Missing,    Pulmonary sleep apnea (does not use CPAP) , COPD,  COPD inhaler, Current Smoker and Patient abstained from smoking.,    Pulmonary exam normal breath sounds clear to auscultation       Cardiovascular hypertension, + CAD and + Past MI  Normal cardiovascular exam+ pacemaker (for SSS)  Rhythm:Regular Rate:Normal  TTE 2020 1. The left ventricle has normal systolic function with an ejection fraction of 60-65%. The cavity size was normal. There is mildly increased left ventricular wall thickness. Left ventricular diastolic parameters were normal.  2. The right ventricle has normal systolic function. The cavity was normal. There is No increase in right ventricular wall thickness. Right ventricular systolic pressure is normal with an estimated pressure of 25.5  mmHg.  3. Left atrial size was normal.  4. Right atrial size was normal.  5. No evidence of mitral valve stenosis.  6. The aortic valve is tricuspid. Aortic valve regurgitation was not visualized by color flow Doppler. No stenosis of the aortic valve.  7. Pulmonic valve regurgitation is trivial by color flow Doppler.  8. The aorta is normal unless otherwise noted.   LHC 2016 Mild CAD, normal EF   Neuro/Psych  Headaches, Seizures - (on keppra), Well Controlled,  PSYCHIATRIC DISORDERS Anxiety Depression Dementia CVA (left sided weakness), Residual Symptoms    GI/Hepatic GERD  ,(+)     substance abuse  cocaine use,   Endo/Other  negative endocrine ROSdiabetes, Insulin Dependent, Oral Hypoglycemic Agents  Renal/GU negative Renal ROS  negative genitourinary    Musculoskeletal  (+) Arthritis ,   Abdominal   Peds  Hematology negative hematology ROS (+)   Anesthesia Other Findings   Reproductive/Obstetrics                            Anesthesia Physical Anesthesia Plan  ASA: III  Anesthesia Plan: General   Post-op Pain Management:    Induction: Intravenous  PONV Risk Score and Plan: 1 and Ondansetron, Dexamethasone and Midazolam  Airway Management Planned: LMA  Additional Equipment:   Intra-op Plan:   Post-operative Plan: Extubation in OR  Informed Consent: I have reviewed the patients History and Physical, chart, labs and discussed the procedure including the risks, benefits and alternatives for the proposed anesthesia with the patient or authorized representative who has indicated his/her understanding and acceptance.     Dental advisory given  Plan Discussed with: CRNA  Anesthesia Plan Comments:         Anesthesia Quick Evaluation

## 2020-08-17 NOTE — Brief Op Note (Signed)
08/17/2020  9:55 AM  PATIENT:  Wesley [name]  64 y.o. male  PRE-OPERATIVE DIAGNOSIS:  LEFT RENAL PELVIS CANCER  POST-OPERATIVE DIAGNOSIS:  LEFT RENAL PELVIS CANCER  PROCEDURE:  Procedure(s) with comments: CYSTOSCOPY/BILATERAL RETROGRADE/ LEFT URETEROSCOPY WITH BIOPSY AND ABLATION OF TUMOR (Bilateral) - 1 HR HOLMIUM LASER APPLICATION (Left)  SURGEON:  Surgeon(s) and Role:    Alexis Frock, MD - Primary  PHYSICIAN ASSISTANT:   ASSISTANTS: none   ANESTHESIA:   general  EBL:  10 mL   BLOOD ADMINISTERED:none  DRAINS: none   LOCAL MEDICATIONS USED:  NONE  SPECIMEN:  Source of Specimen:  left renal pelvis tumor fragments  DISPOSITION OF SPECIMEN:  PATHOLOGY  COUNTS:  YES  TOURNIQUET:  * No tourniquets in log *  DICTATION: .Other Dictation: Dictation Number  [id number]  PLAN OF CARE: Discharge to home after PACU  PATIENT DISPOSITION:  PACU - hemodynamically stable.   Delay start of Pharmacological VTE agent (>24hrs) due to surgical blood loss or risk of bleeding: yes

## 2020-08-17 NOTE — Discharge Instructions (Signed)
1 - You may have urinary urgency (bladder spasms) and bloody urine on / off with stent in place. This is normal.  2 - Call MD or go to ER for fever >102, severe pain / nausea / vomiting not relieved by medications, or acute change in medical status   General Anesthesia, Adult, Care After This sheet gives you information about how to care for yourself after your procedure. Your health care provider may also give you more specific instructions. If you have problems or questions, contact your health care provider. What can I expect after the procedure? After the procedure, the following side effects are common:  Pain or discomfort at the IV site.  Nausea.  Vomiting.  Sore throat.  Trouble concentrating.  Feeling cold or chills.  Weak or tired.  Sleepiness and fatigue.  Soreness and body aches. These side effects can affect parts of the body that were not involved in surgery. Follow these instructions at home:  For at least 24 hours after the procedure:  Have a responsible adult stay with you. It is important to have someone help care for you until you are awake and alert.  Rest as needed.  Do not: ? Participate in activities in which you could fall or become injured. ? Drive. ? Use heavy machinery. ? Drink alcohol. ? Take sleeping pills or medicines that cause drowsiness. ? Make important decisions or sign legal documents. ? Take care of children on your own. Eating and drinking  Follow any instructions from your health care provider about eating or drinking restrictions.  When you feel hungry, start by eating small amounts of foods that are soft and easy to digest (bland), such as toast. Gradually return to your regular diet.  Drink enough fluid to keep your urine pale yellow.  If you vomit, rehydrate by drinking water, juice, or clear broth. General instructions  If you have sleep apnea, surgery and certain medicines can increase your risk for breathing problems.  Follow instructions from your health care provider about wearing your sleep device: ? Anytime you are sleeping, including during daytime naps. ? While taking prescription pain medicines, sleeping medicines, or medicines that make you drowsy.  Return to your normal activities as told by your health care provider. Ask your health care provider what activities are safe for you.  Take over-the-counter and prescription medicines only as told by your health care provider.  If you smoke, do not smoke without supervision.  Keep all follow-up visits as told by your health care provider. This is important. Contact a health care provider if:  You have nausea or vomiting that does not get better with medicine.  You cannot eat or drink without vomiting.  You have pain that does not get better with medicine.  You are unable to pass urine.  You develop a skin rash.  You have a fever.  You have redness around your IV site that gets worse. Get help right away if:  You have difficulty breathing.  You have chest pain.  You have blood in your urine or stool, or you vomit blood. Summary  After the procedure, it is common to have a sore throat or nausea. It is also common to feel tired.  Have a responsible adult stay with you for the first 24 hours after general anesthesia. It is important to have someone help care for you until you are awake and alert.  When you feel hungry, start by eating small amounts of foods that are soft  and easy to digest (bland), such as toast. Gradually return to your regular diet.  Drink enough fluid to keep your urine pale yellow.  Return to your normal activities as told by your health care provider. Ask your health care provider what activities are safe for you. This information is not intended to replace advice given to you by your health care provider. Make sure you discuss any questions you have with your health care provider. Document Revised: 12/04/2017  Document Reviewed: 07/17/2017 Elsevier Patient Education  [place].

## 2020-08-17 NOTE — Anesthesia Postprocedure Evaluation (Signed)
Anesthesia Post Note  Patient: Wesley [name]  Procedure(s) Performed: CYSTOSCOPY/BILATERAL RETROGRADE/ LEFT URETEROSCOPY WITH BIOPSY AND ABLATION OF TUMOR (Bilateral ) HOLMIUM LASER APPLICATION (Left )     Patient location during evaluation: PACU Anesthesia Type: General Level of consciousness: awake and alert Pain management: pain level controlled Vital Signs Assessment: post-procedure vital signs reviewed and stable Respiratory status: spontaneous breathing, nonlabored ventilation, respiratory function stable and patient connected to nasal cannula oxygen Cardiovascular status: blood pressure returned to baseline and stable Postop Assessment: no apparent nausea or vomiting Anesthetic complications: no   No complications documented.  Last Vitals:  Vitals:   08/17/20 1100 08/17/20 1110  BP: 129/66   Pulse: 60   Resp: 14 16  Temp:  36.5 C  SpO2: 96% 93%    Last Pain:  Vitals:   08/17/20 1110  TempSrc:   PainSc: 4                  Rhet Rorke L Aniel Hubble
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
## 2020-08-18 ENCOUNTER — Encounter (HOSPITAL_COMMUNITY): Payer: Self-pay | Admitting: Urology

## 2020-08-21 LAB — SURGICAL PATHOLOGY

## 2020-08-23 ENCOUNTER — Other Ambulatory Visit: Payer: Self-pay | Admitting: Urology

## 2020-09-05 NOTE — Progress Notes (Deleted)
DUE TO COVID-19 ONLY ONE VISITOR IS ALLOWED TO COME WITH YOU AND STAY IN THE WAITING ROOM ONLY DURING PRE OP AND PROCEDURE DAY OF SURGERY. THE 1 VISITOR  MAY VISIT WITH YOU AFTER SURGERY IN YOUR PRIVATE ROOM DURING VISITING HOURS ONLY!  YOU NEED TO HAVE A COVID 19 TEST ON_______ @_______ , THIS TEST MUST BE DONE BEFORE SURGERY,  COVID TESTING SITE [address] [place] [address], IT IS ON THE RIGHT GOING OUT [address] APPROXIMATELY  2 MINUTES PAST [employer] ON THE RIGHT. ONCE YOUR COVID TEST IS COMPLETED,  PLEASE BEGIN THE QUARANTINE INSTRUCTIONS AS OUTLINED IN YOUR HANDOUT.                AQUILLA VOILES  09/05/2020   Your procedure is scheduled on: 09/21/2020    Report to [facility] Main  Entrance   Report to admitting at      1230pm     Call this number if you have problems the morning of surgery [phone]    Remember: Do not eat food , candy gum or mints :After Midnight. You may have clear liquids from midnight until 1130    CLEAR LIQUID DIET   Foods Allowed                                                                       Coffee and tea, regular and decaf                              Plain Jell-O any favor except red or purple                                            Fruit ices (not with fruit pulp)                                      Iced Popsicles                                     Carbonated beverages, regular and diet                                    Cranberry, grape and apple juices Sports drinks like Gatorade Lightly seasoned clear broth or consume(fat free) Sugar, honey syrup   _____________________________________________________________________    BRUSH YOUR TEETH MORNING OF SURGERY AND RINSE YOUR MOUTH OUT, NO CHEWING GUM CANDY OR MINTS.     Take these medicines the morning of surgery with A SIP OF WATER:  Inhalers as usual and bring, Nebuilizer if needed, Eye drops as usual  Keppra, Protonix, Effexor   Novolin 70/30- DO NOT TAKE ANY DIABETIC MEDICATIONS DAY OF YOUR SURGERY  You may not have any metal on your body including hair pins and              piercings  Do not wear jewelry, make-up, lotions, powders or perfumes, deodorant             Do not wear nail polish on your fingernails.  Do not shave  48 hours prior to surgery.              Men may shave face and neck.   Do not bring valuables to the hospital. [place].  Contacts, dentures or bridgework may not be worn into surgery.  Leave suitcase in the car. After surgery it may be brought to your room.     Patients discharged the day of surgery will not be allowed to drive home. IF YOU ARE HAVING SURGERY AND GOING HOME THE SAME DAY, YOU MUST HAVE AN ADULT TO DRIVE YOU HOME AND BE WITH YOU FOR 24 HOURS. YOU MAY GO HOME BY TAXI OR UBER OR ORTHERWISE, BUT AN ADULT MUST ACCOMPANY YOU HOME AND STAY WITH YOU FOR 24 HOURS.  Name and phone number of your driver:  Special Instructions: N/A              Please read over the following fact sheets you were given: _____________________________________________________________________  [facility] - Preparing for Surgery Before surgery, you can play an important role.  Because skin is not sterile, your skin needs to be as free of germs as possible.  You can reduce the number of germs on your skin by washing with CHG (chlorahexidine gluconate) soap before surgery.  CHG is an antiseptic cleaner which kills germs and bonds with the skin to continue killing germs even after washing. Please DO NOT use if you have an allergy to CHG or antibacterial soaps.  If your skin becomes reddened/irritated stop using the CHG and inform your nurse when you arrive at Short Stay. Do not shave (including legs and underarms) for at least 48 hours prior to the first CHG shower.  You may shave your face/neck. Please follow these instructions  carefully:  1.  Shower with CHG Soap the night before surgery and the  morning of Surgery.  2.  If you choose to wash your hair, wash your hair first as usual with your  normal  shampoo.  3.  After you shampoo, rinse your hair and body thoroughly to remove the  shampoo.                           4.  Use CHG as you would any other liquid soap.  You can apply chg directly  to the skin and wash                       Gently with a scrungie or clean washcloth.  5.  Apply the CHG Soap to your body ONLY FROM THE NECK DOWN.   Do not use on face/ open                           Wound or open sores. Avoid contact with eyes, ears mouth and genitals (private parts).  Wash face,  Genitals (private parts) with your normal soap.             6.  Wash thoroughly, paying special attention to the area where your surgery  will be performed.  7.  Thoroughly rinse your body with warm water from the neck down.  8.  DO NOT shower/wash with your normal soap after using and rinsing off  the CHG Soap.                9.  Pat yourself dry with a clean towel.            10.  Wear clean pajamas.            11.  Place clean sheets on your bed the night of your first shower and do not  sleep with pets. Day of Surgery : Do not apply any lotions/deodorants the morning of surgery.  Please wear clean clothes to the hospital/surgery center.  FAILURE TO FOLLOW THESE INSTRUCTIONS MAY RESULT IN THE CANCELLATION OF YOUR SURGERY PATIENT SIGNATURE_________________________________  NURSE SIGNATURE__________________________________  ________________________________________________________________________

## 2020-09-07 ENCOUNTER — Other Ambulatory Visit: Payer: Self-pay

## 2020-09-07 ENCOUNTER — Encounter (HOSPITAL_COMMUNITY): Payer: Self-pay

## 2020-09-07 ENCOUNTER — Encounter (HOSPITAL_COMMUNITY)
Admission: RE | Admit: 2020-09-07 | Discharge: 2020-09-07 | Disposition: A | Discharge status: Home / Self Care | Payer: No Typology Code available for payment source | Source: Ambulatory Visit | Attending: Urology | Admitting: Urology

## 2020-09-07 DIAGNOSIS — Z01812 Encounter for preprocedural laboratory examination: Secondary | ICD-10-CM | POA: Insufficient documentation

## 2020-09-07 LAB — BASIC METABOLIC PANEL
Anion gap: 13 (ref 5–15)
BUN: 13 mg/dL (ref 8–23)
CO2: 24 mmol/L (ref 22–32)
Calcium: 9.4 mg/dL (ref 8.9–10.3)
Chloride: 96 mmol/L — ABNORMAL LOW (ref 98–111)
Creatinine, Ser: 0.94 mg/dL (ref 0.61–1.24)
GFR calc Af Amer: >60 mL/min (ref >60)
GFR calc non Af Amer: >60 mL/min (ref >60)
Glucose, Bld: 222 mg/dL — ABNORMAL HIGH (ref 70–99)
Potassium: 3.7 mmol/L (ref 3.5–5.1)
Sodium: 133 mmol/L — ABNORMAL LOW (ref 135–145)

## 2020-09-07 LAB — CBC
HCT: 44.5 % (ref 39.0–52.0)
Hemoglobin: 14.2 g/dL (ref 13.0–17.0)
MCH: 27 pg (ref 26.0–34.0)
MCHC: 31.9 g/dL (ref 30.0–36.0)
MCV: 84.6 fL (ref 80.0–100.0)
Platelets: 207 10*3/uL (ref 150–400)
RBC: 5.26 MIL/uL (ref 4.22–5.81)
RDW: 14.3 % (ref 11.5–15.5)
WBC: 8 10*3/uL (ref 4.0–10.5)
nRBC: 0 % (ref 0.0–0.2)

## 2020-09-07 NOTE — Progress Notes (Addendum)
Anesthesia Review:  PCP: [facility] Ladoris [facility] week of 09/03/20  Cardiologist :Jule Ser [facility] - [facility]- 04/2020  Chest x-ray : EKG :07/16/20  Echo :2020  Stress test: Cardiac Cath :  Activity level:  Sleep Study/ CPAP : Fasting Blood Sugar :      / Checks Blood Sugar -- times a day:   Blood Thinner/ Instructions /Last Dose: ASA / Instructions/ Last Dose :  Cysto done 08/17/20  Pacemaker  09/05/20- [facility]- with thereapist for PTSD HGBA!c=10.3-8/26/21  Most recent device orders for cysto done 08/17/20 located in [facility] .  Per Konrad Felix, PAC stated those device orders could be used for upcoming procedure since recent and same procedure.

## 2020-09-07 NOTE — Progress Notes (Signed)
Device orders for 08/17/2020 ar elocated in 08/23/2020 under [facility].  Per Narda Bonds same device orders can be used for procedure on 09/21/2020

## 2020-09-07 NOTE — Progress Notes (Signed)
DUE TO COVID-19 ONLY ONE VISITOR IS ALLOWED TO COME WITH YOU AND STAY IN THE WAITING ROOM ONLY DURING PRE OP AND PROCEDURE DAY OF SURGERY. THE 1 VISITOR  MAY VISIT WITH YOU AFTER SURGERY IN YOUR PRIVATE ROOM DURING VISITING HOURS ONLY!  YOU NEED TO HAVE A COVID 19 TEST ON_10/5/21 ______ @_______ , THIS TEST MUST BE DONE BEFORE SURGERY,  COVID TESTING SITE [address] [place] [address], IT IS ON THE RIGHT GOING OUT [address] APPROXIMATELY  2 MINUTES PAST [employer] ON THE RIGHT. ONCE YOUR COVID TEST IS COMPLETED,  PLEASE BEGIN THE QUARANTINE INSTRUCTIONS AS OUTLINED IN YOUR HANDOUT.                Wesley [name]  09/07/2020   Your procedure is scheduled on:    Report to [facility] Main  Entrance   Report to admitting at     1230pm     Call this number if you have problems the morning of surgery [phone]    Remember: Do not eat food , candy gum or mints :After Midnight. You may have clear liquids from midnight until 1130 am IN    CLEAR LIQUID DIET   Foods Allowed                                                                       Coffee and tea, regular and decaf                              Plain Jell-O any favor except red or purple                                            Fruit ices (not with fruit pulp)                                      Iced Popsicles                                     Carbonated beverages, regular and diet                                    Cranberry, grape and apple juices Sports drinks like Gatorade Lightly seasoned clear broth or consume(fat free) Sugar, honey syrup   _____________________________________________________________________    BRUSH YOUR TEETH MORNING OF SURGERY AND RINSE YOUR MOUTH OUT, NO CHEWING GUM CANDY OR MINTS.     Take these medicines the morning of surgery with A SIP OF WATER:  iNHALERS AS USUAL AND BRING, NEBUILIZER IF NEEDED, EYE DROPS AS USUAL , kEPPRA, pROTONIX, eFFEXOR   NOVOLIN 70/30-    DO NOT TAKE ANY DIABETIC MEDICATIONS DAY OF YOUR SURGERY  You may not have any metal on your body including hair pins and              piercings  Do not wear jewelry, make-up, lotions, powders or perfumes, deodorant             Do not wear nail polish on your fingernails.  Do not shave  48 hours prior to surgery.              Men may shave face and neck.   Do not bring valuables to the hospital. [place].  Contacts, dentures or bridgework may not be worn into surgery.  Leave suitcase in the car. After surgery it may be brought to your room.     Patients discharged the day of surgery will not be allowed to drive home. IF YOU ARE HAVING SURGERY AND GOING HOME THE SAME DAY, YOU MUST HAVE AN ADULT TO DRIVE YOU HOME AND BE WITH YOU FOR 24 HOURS. YOU MAY GO HOME BY TAXI OR UBER OR ORTHERWISE, BUT AN ADULT MUST ACCOMPANY YOU HOME AND STAY WITH YOU FOR 24 HOURS.  Name and phone number of your driver:  Special Instructions: N/A              Please read over the following fact sheets you were given: _____________________________________________________________________  [facility] - Preparing for Surgery Before surgery, you can play an important role.  Because skin is not sterile, your skin needs to be as free of germs as possible.  You can reduce the number of germs on your skin by washing with CHG (chlorahexidine gluconate) soap before surgery.  CHG is an antiseptic cleaner which kills germs and bonds with the skin to continue killing germs even after washing. Please DO NOT use if you have an allergy to CHG or antibacterial soaps.  If your skin becomes reddened/irritated stop using the CHG and inform your nurse when you arrive at Short Stay. Do not shave (including legs and underarms) for at least 48 hours prior to the first CHG shower.  You may shave your face/neck. Please follow these instructions  carefully:  1.  Shower with CHG Soap the night before surgery and the  morning of Surgery.  2.  If you choose to wash your hair, wash your hair first as usual with your  normal  shampoo.  3.  After you shampoo, rinse your hair and body thoroughly to remove the  shampoo.                           4.  Use CHG as you would any other liquid soap.  You can apply chg directly  to the skin and wash                       Gently with a scrungie or clean washcloth.  5.  Apply the CHG Soap to your body ONLY FROM THE NECK DOWN.   Do not use on face/ open                           Wound or open sores. Avoid contact with eyes, ears mouth and genitals (private parts).  Wash face,  Genitals (private parts) with your normal soap.             6.  Wash thoroughly, paying special attention to the area where your surgery  will be performed.  7.  Thoroughly rinse your body with warm water from the neck down.  8.  DO NOT shower/wash with your normal soap after using and rinsing off  the CHG Soap.                9.  Pat yourself dry with a clean towel.            10.  Wear clean pajamas.            11.  Place clean sheets on your bed the night of your first shower and do not  sleep with pets. Day of Surgery : Do not apply any lotions/deodorants the morning of surgery.  Please wear clean clothes to the hospital/surgery center.  FAILURE TO FOLLOW THESE INSTRUCTIONS MAY RESULT IN THE CANCELLATION OF YOUR SURGERY PATIENT SIGNATURE_________________________________  NURSE SIGNATURE__________________________________  ________________________________________________________________________

## 2020-09-18 ENCOUNTER — Other Ambulatory Visit (HOSPITAL_COMMUNITY)
Admission: RE | Admit: 2020-09-18 | Discharge: 2020-09-18 | Disposition: A | Discharge status: Home / Self Care | Payer: No Typology Code available for payment source | Source: Ambulatory Visit | Attending: Urology | Admitting: Urology

## 2020-09-18 DIAGNOSIS — Z20822 Contact with and (suspected) exposure to covid-19: Secondary | ICD-10-CM | POA: Diagnosis not present

## 2020-09-18 DIAGNOSIS — Z01812 Encounter for preprocedural laboratory examination: Secondary | ICD-10-CM | POA: Insufficient documentation

## 2020-09-18 LAB — SARS CORONAVIRUS 2 (TAT 6-24 HRS): SARS Coronavirus 2: NEGATIVE

## 2020-09-20 MED ORDER — GENTAMICIN SULFATE 40 MG/ML IJ SOLN
5.0000 mg/kg | INTRAVENOUS | Status: AC
  Administered 2020-09-21: 470 mg via INTRAVENOUS
  Filled 2020-09-20: qty 11.75

## 2020-09-21 ENCOUNTER — Ambulatory Visit (HOSPITAL_COMMUNITY)
Admission: RE | Admit: 2020-09-21 | Discharge: 2020-09-21 | Disposition: A | Discharge status: Home / Self Care | Payer: No Typology Code available for payment source | Attending: Urology | Admitting: Urology

## 2020-09-21 ENCOUNTER — Encounter (HOSPITAL_COMMUNITY): Payer: Self-pay | Admitting: Urology

## 2020-09-21 ENCOUNTER — Encounter (HOSPITAL_COMMUNITY)
Admission: RE | Disposition: A | Discharge status: Home / Self Care | Payer: Self-pay | Source: Home / Self Care | Attending: Urology

## 2020-09-21 ENCOUNTER — Ambulatory Visit (HOSPITAL_COMMUNITY): Payer: No Typology Code available for payment source

## 2020-09-21 ENCOUNTER — Ambulatory Visit (HOSPITAL_COMMUNITY): Payer: No Typology Code available for payment source | Admitting: Certified Registered Nurse Anesthetist

## 2020-09-21 ENCOUNTER — Ambulatory Visit (HOSPITAL_COMMUNITY): Payer: No Typology Code available for payment source | Admitting: Physician Assistant

## 2020-09-21 DIAGNOSIS — F17210 Nicotine dependence, cigarettes, uncomplicated: Secondary | ICD-10-CM | POA: Diagnosis not present

## 2020-09-21 DIAGNOSIS — M1990 Unspecified osteoarthritis, unspecified site: Secondary | ICD-10-CM | POA: Insufficient documentation

## 2020-09-21 DIAGNOSIS — C652 Malignant neoplasm of left renal pelvis: Secondary | ICD-10-CM | POA: Diagnosis not present

## 2020-09-21 DIAGNOSIS — E119 Type 2 diabetes mellitus without complications: Secondary | ICD-10-CM | POA: Insufficient documentation

## 2020-09-21 DIAGNOSIS — Z950 Presence of cardiac pacemaker: Secondary | ICD-10-CM | POA: Diagnosis not present

## 2020-09-21 DIAGNOSIS — J449 Chronic obstructive pulmonary disease, unspecified: Secondary | ICD-10-CM | POA: Diagnosis not present

## 2020-09-21 DIAGNOSIS — I69354 Hemiplegia and hemiparesis following cerebral infarction affecting left non-dominant side: Secondary | ICD-10-CM | POA: Diagnosis not present

## 2020-09-21 DIAGNOSIS — G4730 Sleep apnea, unspecified: Secondary | ICD-10-CM | POA: Diagnosis not present

## 2020-09-21 DIAGNOSIS — I10 Essential (primary) hypertension: Secondary | ICD-10-CM | POA: Insufficient documentation

## 2020-09-21 DIAGNOSIS — I2510 Atherosclerotic heart disease of native coronary artery without angina pectoris: Secondary | ICD-10-CM | POA: Diagnosis not present

## 2020-09-21 DIAGNOSIS — I252 Old myocardial infarction: Secondary | ICD-10-CM | POA: Diagnosis not present

## 2020-09-21 HISTORY — PX: CYSTOSCOPY WITH RETROGRADE PYELOGRAM, URETEROSCOPY AND STENT PLACEMENT: SHX5789

## 2020-09-21 LAB — GLUCOSE, CAPILLARY
Glucose-Capillary: 150 mg/dL — ABNORMAL HIGH (ref 70–99)
Glucose-Capillary: 119 mg/dL — ABNORMAL HIGH (ref 70–99)

## 2020-09-21 SURGERY — CYSTOURETEROSCOPY, WITH RETROGRADE PYELOGRAM AND STENT INSERTION
Anesthesia: General | Laterality: Left

## 2020-09-21 MED ORDER — DEXAMETHASONE SODIUM PHOSPHATE 10 MG/ML IJ SOLN
INTRAMUSCULAR | Status: DC | PRN
  Administered 2020-09-21: 10 mg via INTRAVENOUS

## 2020-09-21 MED ORDER — SODIUM CHLORIDE 0.9 % IR SOLN
Status: DC | PRN
  Administered 2020-09-21: 3000 mL

## 2020-09-21 MED ORDER — DEXAMETHASONE SODIUM PHOSPHATE 10 MG/ML IJ SOLN
INTRAMUSCULAR | Status: AC
  Filled 2020-09-21: qty 1

## 2020-09-21 MED ORDER — FENTANYL CITRATE (PF) 100 MCG/2ML IJ SOLN
INTRAMUSCULAR | Status: AC
  Filled 2020-09-21: qty 2

## 2020-09-21 MED ORDER — NITROFURANTOIN MONOHYD MACRO 100 MG PO CAPS: 100.0000 mg | ORAL_CAPSULE | Freq: Two times a day (BID) | ORAL | 0 refills | Status: AC

## 2020-09-21 MED ORDER — PHENYLEPHRINE 40 MCG/ML (10ML) SYRINGE FOR IV PUSH (FOR BLOOD PRESSURE SUPPORT)
PREFILLED_SYRINGE | INTRAVENOUS | Status: DC | PRN
  Administered 2020-09-21 (×3): 80 ug via INTRAVENOUS

## 2020-09-21 MED ORDER — EPHEDRINE 5 MG/ML INJ
INTRAVENOUS | Status: AC
  Filled 2020-09-21: qty 10

## 2020-09-21 MED ORDER — ONDANSETRON HCL 4 MG/2ML IJ SOLN
INTRAMUSCULAR | Status: AC
  Filled 2020-09-21: qty 2

## 2020-09-21 MED ORDER — ACETAMINOPHEN 500 MG PO TABS
1000.0000 mg | ORAL_TABLET | ORAL | Status: AC
  Administered 2020-09-21: 1000 mg via ORAL

## 2020-09-21 MED ORDER — PROPOFOL 10 MG/ML IV BOLUS
INTRAVENOUS | Status: AC
  Filled 2020-09-21: qty 20

## 2020-09-21 MED ORDER — MIDAZOLAM HCL 5 MG/5ML IJ SOLN
INTRAMUSCULAR | Status: DC | PRN
  Administered 2020-09-21: 2 mg via INTRAVENOUS

## 2020-09-21 MED ORDER — LACTATED RINGERS IV SOLN: INTRAVENOUS | Status: DC

## 2020-09-21 MED ORDER — FENTANYL CITRATE (PF) 100 MCG/2ML IJ SOLN
INTRAMUSCULAR | Status: DC | PRN
  Administered 2020-09-21 (×4): 25 ug via INTRAVENOUS

## 2020-09-21 MED ORDER — LIDOCAINE 2% (20 MG/ML) 5 ML SYRINGE
INTRAMUSCULAR | Status: DC | PRN
  Administered 2020-09-21: 80 mg via INTRAVENOUS

## 2020-09-21 MED ORDER — ONDANSETRON HCL 4 MG/2ML IJ SOLN
INTRAMUSCULAR | Status: DC | PRN
  Administered 2020-09-21: 4 mg via INTRAVENOUS

## 2020-09-21 MED ORDER — ORAL CARE MOUTH RINSE: 15.0000 mL | Freq: Once | OROMUCOSAL | Status: AC

## 2020-09-21 MED ORDER — CHLORHEXIDINE GLUCONATE 0.12 % MT SOLN
15.0000 mL | Freq: Once | OROMUCOSAL | Status: AC
  Administered 2020-09-21: 15 mL via OROMUCOSAL

## 2020-09-21 MED ORDER — EPHEDRINE SULFATE-NACL 50-0.9 MG/10ML-% IV SOSY
PREFILLED_SYRINGE | INTRAVENOUS | Status: DC | PRN
  Administered 2020-09-21 (×4): 10 mg via INTRAVENOUS

## 2020-09-21 MED ORDER — PROPOFOL 10 MG/ML IV BOLUS
INTRAVENOUS | Status: DC | PRN
  Administered 2020-09-21: 180 mg via INTRAVENOUS

## 2020-09-21 MED ORDER — ACETAMINOPHEN 500 MG PO TABS
ORAL_TABLET | ORAL | Status: AC
  Filled 2020-09-21: qty 2

## 2020-09-21 MED ORDER — IOHEXOL 300 MG/ML  SOLN
INTRAMUSCULAR | Status: DC | PRN
  Administered 2020-09-21: 18 mL

## 2020-09-21 MED ORDER — TRAMADOL HCL 50 MG PO TABS: 50.0000 mg | ORAL_TABLET | Freq: Four times a day (QID) | ORAL | 0 refills | Status: DC | PRN

## 2020-09-21 MED ORDER — PHENYLEPHRINE 40 MCG/ML (10ML) SYRINGE FOR IV PUSH (FOR BLOOD PRESSURE SUPPORT)
PREFILLED_SYRINGE | INTRAVENOUS | Status: AC
  Filled 2020-09-21: qty 10

## 2020-09-21 MED ORDER — MIDAZOLAM HCL 2 MG/2ML IJ SOLN
INTRAMUSCULAR | Status: AC
  Filled 2020-09-21: qty 2

## 2020-09-21 SURGICAL SUPPLY — 24 items
BAG URO CATCHER STRL LF (MISCELLANEOUS) ×3 IMPLANT
BASKET LASER NITINOL 1.9FR (BASKET) IMPLANT
BSKT STON RTRVL 120 1.9FR (BASKET)
CATH INTERMIT  6FR 70CM (CATHETERS) ×3 IMPLANT
CLOTH BEACON ORANGE TIMEOUT ST (SAFETY) ×3 IMPLANT
EXTRACTOR STONE 1.7FRX115CM (UROLOGICAL SUPPLIES) IMPLANT
GLOVE BIOGEL M STRL SZ7.5 (GLOVE) ×3 IMPLANT
GOWN STRL REUS W/TWL LRG LVL3 (GOWN DISPOSABLE) ×6 IMPLANT
GUIDEWIRE ANG ZIPWIRE 038X150 (WIRE) ×3 IMPLANT
GUIDEWIRE STR DUAL SENSOR (WIRE) ×3 IMPLANT
KIT TURNOVER KIT A (KITS) IMPLANT
LASER FIB FLEXIVA PULSE ID 365 (Laser) IMPLANT
MANIFOLD NEPTUNE II (INSTRUMENTS) ×3 IMPLANT
PACK CYSTO (CUSTOM PROCEDURE TRAY) ×3 IMPLANT
SHEATH URETERAL 12FRX28CM (UROLOGICAL SUPPLIES) IMPLANT
SHEATH URETERAL 12FRX35CM (MISCELLANEOUS) ×3 IMPLANT
STENT POLARIS 5FRX26 (STENTS) ×3 IMPLANT
SYR BULB IRRIG 60ML STRL (SYRINGE) ×3 IMPLANT
TRACTIP FLEXIVA PULS ID 200XHI (Laser) ×1 IMPLANT
TRACTIP FLEXIVA PULSE ID 200 (Laser) ×3
TUBE FEEDING 8FR 16IN STR KANG (MISCELLANEOUS) ×3 IMPLANT
TUBING CONNECTING 10 (TUBING) ×2 IMPLANT
TUBING CONNECTING 10' (TUBING) ×1
TUBING UROLOGY SET (TUBING) ×3 IMPLANT

## 2020-09-21 NOTE — Anesthesia Postprocedure Evaluation (Signed)
Anesthesia Post Note  Patient: Wesley [name]  Procedure(s) Performed: CYSTOSCOPY WITH RETROGRADE PYELOGRAM, URETEROSCOPY, SECOND STAGE TUMOR ABLATION AND STENT EXCHANGE (Left )     Patient location during evaluation: PACU Anesthesia Type: General Level of consciousness: awake and alert Pain management: pain level controlled Vital Signs Assessment: post-procedure vital signs reviewed and stable Respiratory status: spontaneous breathing, nonlabored ventilation, respiratory function stable and patient connected to nasal cannula oxygen Cardiovascular status: blood pressure returned to baseline and stable Postop Assessment: no apparent nausea or vomiting Anesthetic complications: no   No complications documented.  Last Vitals:  Vitals:   09/21/20 1615 09/21/20 1630  BP: 130/82 114/72  Pulse: 63 62  Resp: 12 12  Temp:  36.4 C  SpO2: 94% 96%    Last Pain:  Vitals:   09/21/20 1615  TempSrc:   PainSc: 0-No pain                 Tiajuana Amass

## 2020-09-21 NOTE — Brief Op Note (Signed)
09/21/2020  3:40 PM  PATIENT:  [name]  64 y.o. male  PRE-OPERATIVE DIAGNOSIS:  LEFT RENAL PELVIS CANCER  POST-OPERATIVE DIAGNOSIS:  LEFT RENAL PELVIS CANCER  PROCEDURE:  Procedure(s) with comments: CYSTOSCOPY WITH RETROGRADE PYELOGRAM, URETEROSCOPY, SECOND STAGE TUMOR ABLATION AND STENT EXCHANGE (Left) - 75 MINS  SURGEON:  Surgeon(s) and Role:    * Alexis Frock, MD - Primary  PHYSICIAN ASSISTANT:   ASSISTANTS: none   ANESTHESIA:   general  EBL:  minimal   BLOOD ADMINISTERED:none  DRAINS: none   LOCAL MEDICATIONS USED:  NONE  SPECIMEN:  No Specimen  DISPOSITION OF SPECIMEN:  N/A  COUNTS:  YES  TOURNIQUET:  * No tourniquets in log *  DICTATION: .Other Dictation: Dictation Number [id number]  PLAN OF CARE: Discharge to home after PACU  PATIENT DISPOSITION:  PACU - hemodynamically stable.   Delay start of Pharmacological VTE agent (>24hrs) due to surgical blood loss or risk of bleeding: yes

## 2020-09-21 NOTE — Anesthesia Procedure Notes (Signed)
Procedure Name: LMA Insertion Date/Time: 09/21/2020 3:07 PM Performed by: Mitzie Na, CRNA Pre-anesthesia Checklist: Patient identified, Emergency Drugs available, Suction available and Patient being monitored Patient Re-evaluated:Patient Re-evaluated prior to induction Oxygen Delivery Method: Circle system utilized Preoxygenation: Pre-oxygenation with 100% oxygen Induction Type: IV induction Ventilation: Mask ventilation without difficulty LMA: LMA inserted LMA Size: 4.0 Tube type: Oral Number of attempts: 1 Placement Confirmation: positive ETCO2 and breath sounds checked- equal and bilateral Tube secured with: Tape Dental Injury: Teeth and Oropharynx as per pre-operative assessment
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
## 2020-09-21 NOTE — Discharge Instructions (Signed)
1 - You may have urinary urgency (bladder spasms) and bloody urine on / off with stent in place. This is normal.  2 - Remove tethered stent on Monday morning at home by pulling string, then blue-white plastic tubing and discarding. Office is open Monday if any issues arise.   3 - Call MD or go to ER for fever >102, severe pain / nausea / vomiting not relieved by medications, or acute change in medical status        General Anesthesia, Adult, Care After This sheet gives you information about how to care for yourself after your procedure. Your health care provider may also give you more specific instructions. If you have problems or questions, contact your health care provider. What can I expect after the procedure? After the procedure, the following side effects are common:  Pain or discomfort at the IV site.  Nausea.  Vomiting.  Sore throat.  Trouble concentrating.  Feeling cold or chills.  Weak or tired.  Sleepiness and fatigue.  Soreness and body aches. These side effects can affect parts of the body that were not involved in surgery. Follow these instructions at home:  For at least 24 hours after the procedure:  Have a responsible adult stay with you. It is important to have someone help care for you until you are awake and alert.  Rest as needed.  Do not: ? Participate in activities in which you could fall or become injured. ? Drive. ? Use heavy machinery. ? Drink alcohol. ? Take sleeping pills or medicines that cause drowsiness. ? Make important decisions or sign legal documents. ? Take care of children on your own. Eating and drinking  Follow any instructions from your health care provider about eating or drinking restrictions.  When you feel hungry, start by eating small amounts of foods that are soft and easy to digest (bland), such as toast. Gradually return to your regular diet.  Drink enough fluid to keep your urine pale yellow.  If you vomit,  rehydrate by drinking water, juice, or clear broth. General instructions  If you have sleep apnea, surgery and certain medicines can increase your risk for breathing problems. Follow instructions from your health care provider about wearing your sleep device: ? Anytime you are sleeping, including during daytime naps. ? While taking prescription pain medicines, sleeping medicines, or medicines that make you drowsy.  Return to your normal activities as told by your health care provider. Ask your health care provider what activities are safe for you.  Take over-the-counter and prescription medicines only as told by your health care provider.  If you smoke, do not smoke without supervision.  Keep all follow-up visits as told by your health care provider. This is important. Contact a health care provider if:  You have nausea or vomiting that does not get better with medicine.  You cannot eat or drink without vomiting.  You have pain that does not get better with medicine.  You are unable to pass urine.  You develop a skin rash.  You have a fever.  You have redness around your IV site that gets worse. Get help right away if:  You have difficulty breathing.  You have chest pain.  You have blood in your urine or stool, or you vomit blood. Summary  After the procedure, it is common to have a sore throat or nausea. It is also common to feel tired.  Have a responsible adult stay with you for the first 24 hours  after general anesthesia. It is important to have someone help care for you until you are awake and alert.  When you feel hungry, start by eating small amounts of foods that are soft and easy to digest (bland), such as toast. Gradually return to your regular diet.  Drink enough fluid to keep your urine pale yellow.  Return to your normal activities as told by your health care provider. Ask your health care provider what activities are safe for you. This information is not  intended to replace advice given to you by your health care provider. Make sure you discuss any questions you have with your health care provider. Document Revised: 12/04/2017 Document Reviewed: 07/17/2017 Elsevier Patient Education  [place].

## 2020-09-21 NOTE — Transfer of Care (Signed)
Immediate Anesthesia Transfer of Care Note  Patient: Wesley [name]  Procedure(s) Performed: CYSTOSCOPY WITH RETROGRADE PYELOGRAM, URETEROSCOPY, SECOND STAGE TUMOR ABLATION AND STENT EXCHANGE (Left )  Patient Location: PACU  Anesthesia Type:General  Level of Consciousness: awake, alert , oriented and patient cooperative  Airway & Oxygen Therapy: Patient Spontanous Breathing and Patient connected to face mask oxygen  Post-op Assessment: Report given to RN, Post -op Vital signs reviewed and stable and Patient moving all extremities  Post vital signs: Reviewed and stable  Last Vitals:  Vitals Value Taken Time  BP    Temp    Pulse 65 09/21/20 1552  Resp 14 09/21/20 1552  SpO2 99 % 09/21/20 1552  Vitals shown include unvalidated device data.  Last Pain:  Vitals:   09/21/20 1210  TempSrc:   PainSc: 0-No pain         Complications: No complications documented.

## 2020-09-21 NOTE — Anesthesia Preprocedure Evaluation (Signed)
Anesthesia Evaluation  Patient identified by MRN, date of birth, ID band Patient awake    Reviewed: Allergy & Precautions, NPO status , Patient's Chart, lab work & pertinent test results  Airway Mallampati: II  TM Distance: >3 FB Neck ROM: Full    Dental no notable dental hx. (+) Poor Dentition, Chipped, Missing,    Pulmonary sleep apnea (does not use CPAP) , COPD,  COPD inhaler, Current Smoker and Patient abstained from smoking.,    Pulmonary exam normal breath sounds clear to auscultation       Cardiovascular hypertension, Pt. on medications + CAD and + Past MI  Normal cardiovascular exam+ pacemaker (for SSS)  Rhythm:Regular Rate:Normal  TTE 2020 1. The left ventricle has normal systolic function with an ejection fraction of 60-65%. The cavity size was normal. There is mildly increased left ventricular wall thickness. Left ventricular diastolic parameters were normal.  2. The right ventricle has normal systolic function. The cavity was normal. There is No increase in right ventricular wall thickness. Right ventricular systolic pressure is normal with an estimated pressure of 25.5  mmHg.  3. Left atrial size was normal.  4. Right atrial size was normal.  5. No evidence of mitral valve stenosis.  6. The aortic valve is tricuspid. Aortic valve regurgitation was not visualized by color flow Doppler. No stenosis of the aortic valve.  7. Pulmonic valve regurgitation is trivial by color flow Doppler.  8. The aorta is normal unless otherwise noted.   LHC 2016 Mild CAD, normal EF   Neuro/Psych  Headaches, Seizures - (on keppra), Well Controlled,  PSYCHIATRIC DISORDERS Anxiety Depression Dementia CVA (left sided weakness), Residual Symptoms    GI/Hepatic GERD  ,(+)     substance abuse  cocaine use,   Endo/Other  negative endocrine ROSdiabetes, Insulin Dependent, Oral Hypoglycemic Agents  Renal/GU negative Renal ROS    negative genitourinary   Musculoskeletal  (+) Arthritis ,   Abdominal   Peds  Hematology negative hematology ROS (+)   Anesthesia Other Findings   Reproductive/Obstetrics                             Anesthesia Physical  Anesthesia Plan  ASA: III  Anesthesia Plan: General   Post-op Pain Management:    Induction: Intravenous  PONV Risk Score and Plan: 1 and Ondansetron, Dexamethasone and Midazolam  Airway Management Planned: LMA  Additional Equipment:   Intra-op Plan:   Post-operative Plan: Extubation in OR  Informed Consent: I have reviewed the patients History and Physical, chart, labs and discussed the procedure including the risks, benefits and alternatives for the proposed anesthesia with the patient or authorized representative who has indicated his/her understanding and acceptance.     Dental advisory given  Plan Discussed with: CRNA  Anesthesia Plan Comments:         Anesthesia Quick Evaluation

## 2020-09-22 ENCOUNTER — Encounter (HOSPITAL_COMMUNITY): Payer: Self-pay | Admitting: Urology

## 2020-09-22 NOTE — Op Note (Signed)
NAME: Wesley [name], Wesley [name] MEDICAL RECORD CN:[id number] ACCOUNT [id number] DATE OF BIRTH:[birth year] FACILITY: [facility] LOCATION: [facility] PHYSICIAN:Jaretzy Lhommedieu Tresa Moore, MD  OPERATIVE REPORT  DATE OF PROCEDURE:  09/21/2020  SURGEON:  Alexis Frock, MD  PREOPERATIVE DIAGNOSIS:  Left renal pelvis cancer, low grade, recurrent.  PROCEDURE: 1.  Cystoscopy, retrograde pyelogram, interpretation. 2.  Left ureteroscopy with second-stage laser ablation of tumor. 3.  Exchange left ureteral stent, proximal end in renal pelvis, distal end in urinary bladder with tether.  ESTIMATED BLOOD LOSS:  Nil.  COMPLICATIONS:  None.  SPECIMENS:  None.  FINDINGS: 1.  Minimal volume left intrarenal papillary tumor, small foci left extreme upper pole and another interpolar superior, total volume estimated 1 cm. 2.  Complete ablation of all visible papillary tumor within the left renal pelvis. 3.  Successful replacement of left ureteral stent, proximal end in upper pole, distal end in urinary bladder.  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  The volume was amenable to endoscopic management and he has been very compliant with surveillance.  He was  found on most recent surveillance last month to have a recurrent tumor, volume approximately 2 to 2.5 cm in the upper pole where it had been before.  It was borderline, amenable to endoscopic management.  He underwent a large volume laser ablation at  that time, but given the volume of disease, it was clearly felt that second-stage procedure would be warranted to verify complete resolution of papillary tumor or a rapid recurrence.  He had  pathology and last procedure was a low-grade once again.   Informed consent was obtained and placed in the medical record.  DESCRIPTION OF PROCEDURE:  The patient being identified, the procedure being left second-stage ureteroscopy, laser  ablation of tumor was confirmed.  Procedure timeout was performed.  Intravenous access administered.  General anesthesia induced.  The  patient was placed into a low lithotomy position.  Sterile field was created, prepped and draped base of the penis, perineum and proximal thighs using iodine.  Cystourethroscopy was performed using 21-French rigid cystoscope with offset lens.    Inspection of the anterior and posterior urethra was unremarkable.  Inspection of bladder revealed distal end of left ureteral stent in situ.  It was grasped, brought to the level of the urethral meatus and a 0.03 ZIPwire was advanced to lower pole and  the stent was exchanged for open-ended catheter and left retrograde pyelogram obtained.  Left retrograde pyelogram demonstrated a single left ureter and single system left kidney.  No obvious filling defects or narrowing noted.  ZIPwire was once again advanced and set aside as a safety wire.  An 8-French feeding tube was placed in the  urinary bladder for pressure release and semirigid ureteroscopy performed of the distal four-fifths of the left ureter alongside a separate Sensor working wire and no  mucosal abnormalities were found.  The semirigid scope was then exchanged for a 12/14  medium length ureteral access sheath to the level of the proximal ureter.  Using continuous fluoroscopic guidance, a flexible digital ureteroscopy was performed of the proximal left ureter and systematic inspection left kidney, including all calices x3.   He had had a remarkable amount of healing in the interval and the previous procedure was even more successful than anticipated.  He had minimal residual volume of papillary tumor, 2 small foci, 1 at extreme upper pole and 1 interpolar area, total volume  1 cm or less.  This was easily amenable to endoscopic management again and holmium laser was used to ablate these foci of tumor using settings of 1.5 joules and 5 Hz.  Following this, there was  excellent hemostasis.  No evidence of residual tumor.   There is no evidence of significant perforation.  The access sheath was removed under continuous vision and no significant mucosal abnormalities were found.  Given the favorable nature of the procedure today, it was felt that interval stenting with a  tethered stent would be advantageous.  As such, a new 5 x 26 Polaris-type stent was placed over the safety wire using fluoroscopic guidance.  Good proximal and distal planes were noted.  Tether was left in place and fashioned to the dorsum of the penis.   The procedure was terminated.  The patient tolerated the procedure well.  No immediate perioperative complications.  The patient was taken to postanesthesia care unit in stable condition.  Plan for discharge home and stent pull on Monday.  VN/NUANCE  D:09/21/2020 T:09/21/2020 JOB:[id number]

## 2021-01-24 ENCOUNTER — Encounter (HOSPITAL_COMMUNITY): Payer: Self-pay

## 2021-01-24 ENCOUNTER — Emergency Department (HOSPITAL_COMMUNITY): Payer: No Typology Code available for payment source

## 2021-01-24 ENCOUNTER — Other Ambulatory Visit: Payer: Self-pay

## 2021-01-24 ENCOUNTER — Emergency Department (HOSPITAL_COMMUNITY)
Admission: EM | Admit: 2021-01-24 | Discharge: 2021-01-24 | Disposition: A | Discharge status: Home / Self Care | Payer: No Typology Code available for payment source | Attending: Emergency Medicine | Admitting: Emergency Medicine

## 2021-01-24 DIAGNOSIS — Z7982 Long term (current) use of aspirin: Secondary | ICD-10-CM | POA: Insufficient documentation

## 2021-01-24 DIAGNOSIS — W1830XA Fall on same level, unspecified, initial encounter: Secondary | ICD-10-CM | POA: Insufficient documentation

## 2021-01-24 DIAGNOSIS — I2510 Atherosclerotic heart disease of native coronary artery without angina pectoris: Secondary | ICD-10-CM | POA: Insufficient documentation

## 2021-01-24 DIAGNOSIS — F17210 Nicotine dependence, cigarettes, uncomplicated: Secondary | ICD-10-CM | POA: Insufficient documentation

## 2021-01-24 DIAGNOSIS — Z79899 Other long term (current) drug therapy: Secondary | ICD-10-CM | POA: Insufficient documentation

## 2021-01-24 DIAGNOSIS — Z794 Long term (current) use of insulin: Secondary | ICD-10-CM | POA: Insufficient documentation

## 2021-01-24 DIAGNOSIS — S63502A Unspecified sprain of left wrist, initial encounter: Secondary | ICD-10-CM | POA: Insufficient documentation

## 2021-01-24 DIAGNOSIS — Z7951 Long term (current) use of inhaled steroids: Secondary | ICD-10-CM | POA: Insufficient documentation

## 2021-01-24 DIAGNOSIS — Z85528 Personal history of other malignant neoplasm of kidney: Secondary | ICD-10-CM | POA: Diagnosis not present

## 2021-01-24 DIAGNOSIS — Z950 Presence of cardiac pacemaker: Secondary | ICD-10-CM | POA: Insufficient documentation

## 2021-01-24 DIAGNOSIS — I10 Essential (primary) hypertension: Secondary | ICD-10-CM | POA: Insufficient documentation

## 2021-01-24 DIAGNOSIS — S60211A Contusion of right wrist, initial encounter: Secondary | ICD-10-CM | POA: Diagnosis not present

## 2021-01-24 DIAGNOSIS — S8392XA Sprain of unspecified site of left knee, initial encounter: Secondary | ICD-10-CM | POA: Diagnosis not present

## 2021-01-24 DIAGNOSIS — E119 Type 2 diabetes mellitus without complications: Secondary | ICD-10-CM | POA: Insufficient documentation

## 2021-01-24 DIAGNOSIS — F0390 Unspecified dementia without behavioral disturbance: Secondary | ICD-10-CM | POA: Diagnosis not present

## 2021-01-24 DIAGNOSIS — J449 Chronic obstructive pulmonary disease, unspecified: Secondary | ICD-10-CM | POA: Insufficient documentation

## 2021-01-24 DIAGNOSIS — R55 Syncope and collapse: Secondary | ICD-10-CM | POA: Diagnosis not present

## 2021-01-24 DIAGNOSIS — R42 Dizziness and giddiness: Secondary | ICD-10-CM | POA: Insufficient documentation

## 2021-01-24 DIAGNOSIS — Z7984 Long term (current) use of oral hypoglycemic drugs: Secondary | ICD-10-CM | POA: Insufficient documentation

## 2021-01-24 DIAGNOSIS — W19XXXA Unspecified fall, initial encounter: Secondary | ICD-10-CM

## 2021-01-24 DIAGNOSIS — R519 Headache, unspecified: Secondary | ICD-10-CM | POA: Diagnosis not present

## 2021-01-24 DIAGNOSIS — I951 Orthostatic hypotension: Secondary | ICD-10-CM

## 2021-01-24 DIAGNOSIS — S6992XA Unspecified injury of left wrist, hand and finger(s), initial encounter: Secondary | ICD-10-CM | POA: Diagnosis present

## 2021-01-24 LAB — BASIC METABOLIC PANEL
Anion gap: 13 (ref 5–15)
BUN: 17 mg/dL (ref 8–23)
CO2: 24 mmol/L (ref 22–32)
Calcium: 9.3 mg/dL (ref 8.9–10.3)
Chloride: 101 mmol/L (ref 98–111)
Creatinine, Ser: 0.78 mg/dL (ref 0.61–1.24)
GFR, Estimated: >60 mL/min (ref >60)
Glucose, Bld: 189 mg/dL — ABNORMAL HIGH (ref 70–99)
Potassium: 3.7 mmol/L (ref 3.5–5.1)
Sodium: 138 mmol/L (ref 135–145)

## 2021-01-24 LAB — CBC
HCT: 43.9 % (ref 39.0–52.0)
Hemoglobin: 13.6 g/dL (ref 13.0–17.0)
MCH: 26.9 pg (ref 26.0–34.0)
MCHC: 31 g/dL (ref 30.0–36.0)
MCV: 86.9 fL (ref 80.0–100.0)
Platelets: 192 10*3/uL (ref 150–400)
RBC: 5.05 MIL/uL (ref 4.22–5.81)
RDW: 15.3 % (ref 11.5–15.5)
WBC: 7.3 10*3/uL (ref 4.0–10.5)
nRBC: 0 % (ref 0.0–0.2)

## 2021-01-24 MED ORDER — LACTATED RINGERS IV BOLUS
1000.0000 mL | Freq: Once | INTRAVENOUS | Status: AC
  Administered 2021-01-24: 1000 mL via INTRAVENOUS

## 2021-01-24 NOTE — Discharge Instructions (Addendum)
All the x-rays today look good.  Your pacemaker is working well and you have had no heart events.  Your blood work looked normal today.  Your blood pressure does drop when you are laying down and go to stand up which is probably what is making you dizzy and passed out.  This may be improved if you are able to decrease the dose or discontinue some of your blood pressure medication.  You would need to discuss this with your regular doctor.  They also may be able to give you something to help with your appetite.  You can use extra strength Tylenol as needed for the pain from the fall.

## 2021-01-24 NOTE — ED Triage Notes (Signed)
Patient brought in by wife with complaints of increased falls recently and weakness. Reports that his most recent fall was yesterday and has complaints of left wrist, knee, and back. Unsure of LOC
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
## 2021-01-24 NOTE — Progress Notes (Signed)
Orthopedic Tech Progress Note Patient Details:  Wesley [name] [birth year] [id number] Applied wrist splint to patient.  Ortho Devices Type of Ortho Device: Wrist splint Ortho Device/Splint Location: LUE Ortho Device/Splint Interventions: Ordered,Application,Adjustment   Post Interventions Patient Tolerated: Well Instructions Provided: Adjustment of device   Petra Kuba 01/24/2021, 12:07 PM

## 2021-10-01 ENCOUNTER — Other Ambulatory Visit: Payer: Self-pay | Admitting: Urology

## 2021-10-02 ENCOUNTER — Other Ambulatory Visit: Payer: Self-pay | Admitting: Urology

## 2021-10-10 NOTE — Patient Instructions (Addendum)
DUE TO COVID-19 ONLY ONE VISITOR IS ALLOWED TO COME WITH YOU AND STAY IN THE WAITING ROOM ONLY DURING PRE OP AND PROCEDURE.   **NO VISITORS ARE ALLOWED IN THE SHORT STAY AREA OR RECOVERY ROOM!!**       Your procedure is scheduled on: 10/18/21   Report to [facility] Main Entrance    Report to admitting at 8:45 AM   Call this number if you have problems the morning of surgery [phone]   Do not eat food :After Midnight.   May have liquids until 8:45 AM day of surgery  CLEAR LIQUID DIET  Foods Allowed                                                                     Foods Excluded  Water, Black Coffee and tea (no milk or creamer)            liquids that you cannot  Plain Jell-O in any flavor  (No red)                                     see through such as: Fruit ices (not with fruit pulp)                                             milk, soups, orange juice              Iced Popsicles (No red)                                                 All solid food                                   Apple juices Sports drinks like Gatorade (No red) Lightly seasoned clear broth or consume(fat free) Sugar   Oral Hygiene is also important to reduce your risk of infection.                                    Remember - BRUSH YOUR TEETH THE MORNING OF SURGERY WITH YOUR REGULAR TOOTHPASTE   Do NOT smoke after Midnight   Take these medicines the morning of surgery with A SIP OF WATER: Inhalers, Finasteride, Gabapentin, Keppra, Protonix, Flomax, Effexor, carbidopa-levodopa, Pamelor  DO NOT TAKE ANY ORAL DIABETIC MEDICATIONS DAY OF YOUR SURGERY  How to Manage Your Diabetes Before and After Surgery  Why is it important to control my blood sugar before and after surgery? Improving blood sugar levels before and after surgery helps healing and can limit problems. A way of improving blood sugar control is eating a healthy diet by:  Eating less sugar and carbohydrates  Increasing  activity/exercise  Talking with your doctor about reaching your blood sugar goals High  blood sugars (greater than 180 mg/dL) can raise your risk of infections and slow your recovery, so you will need to focus on controlling your diabetes during the weeks before surgery. Make sure that the doctor who takes care of your diabetes knows about your planned surgery including the date and location.  How do I manage my blood sugar before surgery? Check your blood sugar at least 4 times a day, starting 2 days before surgery, to make sure that the level is not too high or low. Check your blood sugar the morning of your surgery when you wake up and every 2 hours until you get to the Short Stay unit. If your blood sugar is less than 70 mg/dL, you will need to treat for low blood sugar: Do not take insulin. Treat a low blood sugar (less than 70 mg/dL) with  cup of clear juice (cranberry or apple), 4 glucose tablets, OR glucose gel. Recheck blood sugar in 15 minutes after treatment (to make sure it is greater than 70 mg/dL). If your blood sugar is not greater than 70 mg/dL on recheck, call [phone] for further instructions. Report your blood sugar to the short stay nurse when you get to Short Stay.  If you are admitted to the hospital after surgery: Your blood sugar will be checked by the staff and you will probably be given insulin after surgery (instead of oral diabetes medicines) to make sure you have good blood sugar levels. The goal for blood sugar control after surgery is 80-180 mg/dL.   WHAT DO I DO ABOUT MY DIABETES MEDICATION?  Do not take oral diabetes medicines (pills) the morning of surgery.  THE DAY BEFORE SURGERY, take Metformin as prescribed. Take morning Novolog as normal. Take 70% (21 units) of evening Novolog.     THE MORNING OF SURGERY, do not take any diabetic medications   Reviewed and Endorsed by [facility] Patient Education Committee, August 2015                                You may not have any metal on your body including jewelry, and body piercing             Do not wear lotions, powders, cologne, or deodorant              Men may shave face and neck.   Do not bring valuables to the hospital. [place].    Patients discharged on the day of surgery will not be allowed to drive home.  Special Instructions: Bring a copy of your healthcare power of attorney and living will documents         the day of surgery if you haven't scanned them before.   Please read over the following fact sheets you were given: IF YOU HAVE QUESTIONS ABOUT YOUR PRE-OP INSTRUCTIONS PLEASE CALL [facility] - Preparing for Surgery Before surgery, you can play an important role.  Because skin is not sterile, your skin needs to be as free of germs as possible.  You can reduce the number of germs on your skin by washing with CHG (chlorahexidine gluconate) soap before surgery.  CHG is an antiseptic cleaner which kills germs and bonds with the skin to continue killing germs even after washing. Please DO NOT use if  you have an allergy to CHG or antibacterial soaps.  If your skin becomes reddened/irritated stop using the CHG and inform your nurse when you arrive at Short Stay. Do not shave (including legs and underarms) for at least 48 hours prior to the first CHG shower.  You may shave your face/neck.  Please follow these instructions carefully:  1.  Shower with CHG Soap the night before surgery and the  morning of surgery.  2.  If you choose to wash your hair, wash your hair first as usual with your normal  shampoo.  3.  After you shampoo, rinse your hair and body thoroughly to remove the shampoo.                             4.  Use CHG as you would any other liquid soap.  You can apply chg directly to the skin and wash.  Gently with a scrungie or clean washcloth.  5.  Apply the CHG Soap to your body ONLY FROM THE NECK  DOWN.   Do   not use on face/ open                           Wound or open sores. Avoid contact with eyes, ears mouth and   genitals (private parts).                       Wash face,  Genitals (private parts) with your normal soap.             6.  Wash thoroughly, paying special attention to the area where your    surgery  will be performed.  7.  Thoroughly rinse your body with warm water from the neck down.  8.  DO NOT shower/wash with your normal soap after using and rinsing off the CHG Soap.                9.  Pat yourself dry with a clean towel.            10.  Wear clean pajamas.            11.  Place clean sheets on your bed the night of your first shower and do not  sleep with pets. Day of Surgery : Do not apply any lotions/deodorants the morning of surgery.  Please wear clean clothes to the hospital/surgery center.  FAILURE TO FOLLOW THESE INSTRUCTIONS MAY RESULT IN THE CANCELLATION OF YOUR SURGERY  PATIENT SIGNATURE_________________________________  NURSE SIGNATURE__________________________________  ________________________________________________________________________

## 2021-10-10 NOTE — Progress Notes (Addendum)
COVID swab appointment: n/a  COVID Vaccine Completed: yes x3 Date COVID Vaccine completed: 02/14/20, 03/06/20 Has received booster: 09/25/20 COVID vaccine manufacturer: Pfizer      Date of COVID positive in last 90 days: no  PCP - [place] - Dr. Iona Coach, [place]  Chest x-ray - 01/24/21 Epic EKG - 01/24/21 Epic Stress Test - n/a ECHO - 08/31/19 Epic Cardiac Cath - 2016 Pacemaker/ICD device last checked: 2-3 months ago,  Spinal Cord Stimulator: n/a  Sleep Study - yes CPAP - yes  Fasting Blood Sugar - 120 Checks Blood Sugar checks every 2-3 days  Blood Thinner Instructions: Aspirin Instructions: ASA 325 Last Dose: stopped a few weeks ago, pt ran out of med. Doesn't plan to restart before surgery  Activity level: Can go up a flight of stairs and perform activities of daily living without stopping and without symptoms of chest pain or shortness of breath. SOB due to COPD        Anesthesia review: HTN, COPD, OSA, DM 2, pacemaker, palpitations, stroke with right sided weakness, MI  Patient denies shortness of breath, fever, cough and chest pain at PAT appointment   Patient verbalized understanding of instructions that were given to them at the PAT appointment. Patient was also instructed that they will need to review over the PAT instructions again at home before surgery.

## 2021-10-11 ENCOUNTER — Encounter (HOSPITAL_COMMUNITY): Payer: Self-pay

## 2021-10-11 ENCOUNTER — Encounter (HOSPITAL_COMMUNITY)
Admission: RE | Admit: 2021-10-11 | Discharge: 2021-10-11 | Disposition: A | Discharge status: Home / Self Care | Payer: No Typology Code available for payment source | Source: Ambulatory Visit | Attending: Urology | Admitting: Urology

## 2021-10-11 ENCOUNTER — Other Ambulatory Visit: Payer: Self-pay

## 2021-10-11 VITALS — BP 147/85 | HR 67 | Temp 98.0°F | Resp 18 | Ht 74.0 in | Wt 199.4 lb

## 2021-10-11 DIAGNOSIS — E1169 Type 2 diabetes mellitus with other specified complication: Secondary | ICD-10-CM | POA: Diagnosis not present

## 2021-10-11 DIAGNOSIS — E669 Obesity, unspecified: Secondary | ICD-10-CM | POA: Insufficient documentation

## 2021-10-11 DIAGNOSIS — Z01812 Encounter for preprocedural laboratory examination: Secondary | ICD-10-CM | POA: Diagnosis present

## 2021-10-11 HISTORY — DX: Syncope and collapse: R55

## 2021-10-11 HISTORY — DX: Unspecified glaucoma: H40.9

## 2021-10-11 LAB — CBC
HCT: 44.4 % (ref 39.0–52.0)
Hemoglobin: 14 g/dL (ref 13.0–17.0)
MCH: 27.7 pg (ref 26.0–34.0)
MCHC: 31.5 g/dL (ref 30.0–36.0)
MCV: 87.7 fL (ref 80.0–100.0)
Platelets: 184 10*3/uL (ref 150–400)
RBC: 5.06 MIL/uL (ref 4.22–5.81)
RDW: 14.1 % (ref 11.5–15.5)
WBC: 5.7 10*3/uL (ref 4.0–10.5)
nRBC: 0 % (ref 0.0–0.2)

## 2021-10-11 LAB — BASIC METABOLIC PANEL
Anion gap: 11 (ref 5–15)
BUN: 15 mg/dL (ref 8–23)
CO2: 25 mmol/L (ref 22–32)
Calcium: 9.5 mg/dL (ref 8.9–10.3)
Chloride: 100 mmol/L (ref 98–111)
Creatinine, Ser: 1.06 mg/dL (ref 0.61–1.24)
GFR, Estimated: >60 mL/min (ref >60)
Glucose, Bld: 232 mg/dL — ABNORMAL HIGH (ref 70–99)
Potassium: 3.6 mmol/L (ref 3.5–5.1)
Sodium: 136 mmol/L (ref 135–145)

## 2021-10-11 LAB — HEMOGLOBIN A1C
Hgb A1c MFr Bld: 7.7 % — ABNORMAL HIGH (ref 4.8–5.6)
Mean Plasma Glucose: 174.29 mg/dL

## 2021-10-11 LAB — GLUCOSE, CAPILLARY: Glucose-Capillary: 249 mg/dL — ABNORMAL HIGH (ref 70–99)

## 2021-10-18 ENCOUNTER — Ambulatory Visit (HOSPITAL_COMMUNITY)
Admission: RE | Admit: 2021-10-18 | Discharge: 2021-10-18 | Disposition: A | Discharge status: Home / Self Care | Payer: No Typology Code available for payment source | Attending: Urology | Admitting: Urology

## 2021-10-18 ENCOUNTER — Encounter (HOSPITAL_COMMUNITY)
Admission: RE | Disposition: A | Discharge status: Home / Self Care | Payer: Self-pay | Source: Home / Self Care | Attending: Urology

## 2021-10-18 ENCOUNTER — Ambulatory Visit (HOSPITAL_COMMUNITY): Payer: No Typology Code available for payment source

## 2021-10-18 ENCOUNTER — Encounter (HOSPITAL_COMMUNITY): Payer: Self-pay | Admitting: Urology

## 2021-10-18 ENCOUNTER — Ambulatory Visit (HOSPITAL_COMMUNITY): Payer: No Typology Code available for payment source | Admitting: Physician Assistant

## 2021-10-18 ENCOUNTER — Ambulatory Visit (HOSPITAL_COMMUNITY): Payer: No Typology Code available for payment source | Admitting: Anesthesiology

## 2021-10-18 DIAGNOSIS — G4730 Sleep apnea, unspecified: Secondary | ICD-10-CM | POA: Diagnosis not present

## 2021-10-18 DIAGNOSIS — I10 Essential (primary) hypertension: Secondary | ICD-10-CM | POA: Diagnosis not present

## 2021-10-18 DIAGNOSIS — Z7984 Long term (current) use of oral hypoglycemic drugs: Secondary | ICD-10-CM | POA: Insufficient documentation

## 2021-10-18 DIAGNOSIS — D49512 Neoplasm of unspecified behavior of left kidney: Secondary | ICD-10-CM | POA: Insufficient documentation

## 2021-10-18 DIAGNOSIS — F17210 Nicotine dependence, cigarettes, uncomplicated: Secondary | ICD-10-CM | POA: Insufficient documentation

## 2021-10-18 DIAGNOSIS — Z794 Long term (current) use of insulin: Secondary | ICD-10-CM | POA: Insufficient documentation

## 2021-10-18 DIAGNOSIS — Z950 Presence of cardiac pacemaker: Secondary | ICD-10-CM | POA: Insufficient documentation

## 2021-10-18 DIAGNOSIS — E119 Type 2 diabetes mellitus without complications: Secondary | ICD-10-CM | POA: Diagnosis not present

## 2021-10-18 HISTORY — PX: CYSTOSCOPY/RETROGRADE/URETEROSCOPY: SHX5316

## 2021-10-18 HISTORY — PX: HOLMIUM LASER APPLICATION: SHX5852

## 2021-10-18 LAB — GLUCOSE, CAPILLARY
Glucose-Capillary: 182 mg/dL — ABNORMAL HIGH (ref 70–99)
Glucose-Capillary: 179 mg/dL — ABNORMAL HIGH (ref 70–99)

## 2021-10-18 SURGERY — CYSTOSCOPY/RETROGRADE/URETEROSCOPY
Anesthesia: General | Site: Urethra | Laterality: Left

## 2021-10-18 MED ORDER — ONDANSETRON HCL 4 MG/2ML IJ SOLN
INTRAMUSCULAR | Status: AC
  Filled 2021-10-18: qty 2

## 2021-10-18 MED ORDER — ORAL CARE MOUTH RINSE: 15.0000 mL | Freq: Once | OROMUCOSAL | Status: AC

## 2021-10-18 MED ORDER — TRAMADOL HCL 50 MG PO TABS: 50.0000 mg | ORAL_TABLET | Freq: Four times a day (QID) | ORAL | 0 refills | Status: DC | PRN

## 2021-10-18 MED ORDER — ALBUMIN HUMAN 5 % IV SOLN
INTRAVENOUS | Status: AC
  Filled 2021-10-18: qty 250

## 2021-10-18 MED ORDER — LIDOCAINE HCL (PF) 2 % IJ SOLN
INTRAMUSCULAR | Status: AC
  Filled 2021-10-18: qty 5

## 2021-10-18 MED ORDER — AMISULPRIDE (ANTIEMETIC) 5 MG/2ML IV SOLN: 10.0000 mg | Freq: Once | INTRAVENOUS | Status: DC | PRN

## 2021-10-18 MED ORDER — FENTANYL CITRATE (PF) 100 MCG/2ML IJ SOLN
INTRAMUSCULAR | Status: DC | PRN
  Administered 2021-10-18: 25 ug via INTRAVENOUS
  Administered 2021-10-18: 50 ug via INTRAVENOUS
  Administered 2021-10-18: 25 ug via INTRAVENOUS

## 2021-10-18 MED ORDER — ONDANSETRON HCL 4 MG/2ML IJ SOLN
INTRAMUSCULAR | Status: DC | PRN
  Administered 2021-10-18: 4 mg via INTRAVENOUS

## 2021-10-18 MED ORDER — DEXAMETHASONE SODIUM PHOSPHATE 10 MG/ML IJ SOLN
INTRAMUSCULAR | Status: AC
  Filled 2021-10-18: qty 1

## 2021-10-18 MED ORDER — FENTANYL CITRATE (PF) 100 MCG/2ML IJ SOLN
INTRAMUSCULAR | Status: AC
  Filled 2021-10-18: qty 2

## 2021-10-18 MED ORDER — SODIUM CHLORIDE 0.9 % IR SOLN
Status: DC | PRN
  Administered 2021-10-18: 3000 mL

## 2021-10-18 MED ORDER — PHENYLEPHRINE 40 MCG/ML (10ML) SYRINGE FOR IV PUSH (FOR BLOOD PRESSURE SUPPORT)
PREFILLED_SYRINGE | INTRAVENOUS | Status: AC
  Filled 2021-10-18: qty 10

## 2021-10-18 MED ORDER — EPHEDRINE 5 MG/ML INJ
INTRAVENOUS | Status: AC
  Filled 2021-10-18: qty 5

## 2021-10-18 MED ORDER — DEXMEDETOMIDINE (PRECEDEX) IN NS 20 MCG/5ML (4 MCG/ML) IV SYRINGE
PREFILLED_SYRINGE | INTRAVENOUS | Status: AC
  Filled 2021-10-18: qty 5

## 2021-10-18 MED ORDER — GENTAMICIN SULFATE 40 MG/ML IJ SOLN
5.0000 mg/kg | Freq: Once | INTRAVENOUS | Status: AC
  Administered 2021-10-18: 450 mg via INTRAVENOUS
  Filled 2021-10-18: qty 11.25

## 2021-10-18 MED ORDER — CHLORHEXIDINE GLUCONATE 0.12 % MT SOLN
15.0000 mL | Freq: Once | OROMUCOSAL | Status: AC
  Administered 2021-10-18: 15 mL via OROMUCOSAL

## 2021-10-18 MED ORDER — FENTANYL CITRATE PF 50 MCG/ML IJ SOSY: 25.0000 ug | PREFILLED_SYRINGE | INTRAMUSCULAR | Status: DC | PRN

## 2021-10-18 MED ORDER — ACETAMINOPHEN 10 MG/ML IV SOLN: 1000.0000 mg | Freq: Once | INTRAVENOUS | Status: DC | PRN

## 2021-10-18 MED ORDER — LIDOCAINE 2% (20 MG/ML) 5 ML SYRINGE
INTRAMUSCULAR | Status: DC | PRN
  Administered 2021-10-18: 60 mg via INTRAVENOUS

## 2021-10-18 MED ORDER — LACTATED RINGERS IV SOLN: INTRAVENOUS | Status: DC

## 2021-10-18 MED ORDER — IOHEXOL 300 MG/ML  SOLN
INTRAMUSCULAR | Status: DC | PRN
  Administered 2021-10-18: 20 mL

## 2021-10-18 MED ORDER — PROPOFOL 10 MG/ML IV BOLUS
INTRAVENOUS | Status: AC
  Filled 2021-10-18: qty 20

## 2021-10-18 MED ORDER — ACETAMINOPHEN 325 MG PO TABS: 325.0000 mg | ORAL_TABLET | ORAL | Status: DC | PRN

## 2021-10-18 MED ORDER — PROMETHAZINE HCL 25 MG/ML IJ SOLN: 6.2500 mg | INTRAMUSCULAR | Status: DC | PRN

## 2021-10-18 MED ORDER — ACETAMINOPHEN 160 MG/5ML PO SOLN: 325.0000 mg | ORAL | Status: DC | PRN

## 2021-10-18 MED ORDER — PROPOFOL 10 MG/ML IV BOLUS
INTRAVENOUS | Status: DC | PRN
  Administered 2021-10-18: 180 mg via INTRAVENOUS

## 2021-10-18 SURGICAL SUPPLY — 21 items
BAG URO CATCHER STRL LF (MISCELLANEOUS) ×3 IMPLANT
BASKET LASER NITINOL 1.9FR (BASKET) IMPLANT
CATH URETL OPEN END 6FR 70 (CATHETERS) ×3 IMPLANT
CLOTH BEACON ORANGE TIMEOUT ST (SAFETY) ×3 IMPLANT
EXTRACTOR STONE 1.7FRX115CM (UROLOGICAL SUPPLIES) ×3 IMPLANT
FORCEPS BIOP 2.4F 115CM BACKLD (INSTRUMENTS) ×3 IMPLANT
GLOVE SURG ENC TEXT LTX SZ7.5 (GLOVE) ×3 IMPLANT
GOWN STRL REUS W/TWL LRG LVL3 (GOWN DISPOSABLE) ×6 IMPLANT
GUIDEWIRE ANG ZIPWIRE 038X150 (WIRE) ×3 IMPLANT
GUIDEWIRE STR DUAL SENSOR (WIRE) ×3 IMPLANT
KIT TURNOVER KIT A (KITS) IMPLANT
LASER FIB FLEXIVA PULSE ID 365 (Laser) IMPLANT
MANIFOLD NEPTUNE II (INSTRUMENTS) ×3 IMPLANT
PACK CYSTO (CUSTOM PROCEDURE TRAY) ×3 IMPLANT
SHEATH URETERAL 12FRX35CM (MISCELLANEOUS) ×3 IMPLANT
STENT POLARIS 5FRX26 (STENTS) ×3 IMPLANT
TRACTIP FLEXIVA PULS ID 200XHI (Laser) ×2 IMPLANT
TRACTIP FLEXIVA PULSE ID 200 (Laser) ×3
TUBE FEEDING 8FR 16IN STR KANG (MISCELLANEOUS) ×3 IMPLANT
TUBING CONNECTING 10 (TUBING) ×3 IMPLANT
TUBING UROLOGY SET (TUBING) ×3 IMPLANT

## 2021-10-18 NOTE — Transfer of Care (Signed)
Immediate Anesthesia Transfer of Care Note  Patient: Wesley [name]  Procedure(s) Performed: CYSTOSCOPY/ BILATERAL RETROGRADE/ LEFT URETEROSCOPY/ BIOPSY OF LEFT RENAL PELVIS (Bilateral: Ureter) HOLMIUM LASER APPLICATION (Left: Urethra)  Patient Location: PACU  Anesthesia Type:General  Level of Consciousness: awake, alert , oriented and patient cooperative  Airway & Oxygen Therapy: Patient Spontanous Breathing and Patient connected to face mask oxygen  Post-op Assessment: Report given to RN, Post -op Vital signs reviewed and stable and Patient moving all extremities  Post vital signs: Reviewed and stable  Last Vitals:  Vitals Value Taken Time  BP 162/96 10/18/21 1119  Temp    Pulse 59 10/18/21 1121  Resp 14 10/18/21 1121  SpO2 100 % 10/18/21 1121  Vitals shown include unvalidated device data.  Last Pain:  Vitals:   10/18/21 0908  TempSrc:   PainSc: 0-No pain         Complications: No notable events documented.

## 2021-10-18 NOTE — Discharge Instructions (Signed)
1 - You may have urinary urgency (bladder spasms) and bloody urine on / off with stent in place. This is normal. ° °2 - Call MD or go to ER for fever >102, severe pain / nausea / vomiting not relieved by medications, or acute change in medical status ° °

## 2021-10-18 NOTE — Anesthesia Procedure Notes (Signed)
Procedure Name: LMA Insertion Date/Time: 10/18/2021 10:16 AM Performed by: Navina Wohlers D, CRNA Pre-anesthesia Checklist: Patient identified, Emergency Drugs available, Suction available and Patient being monitored Patient Re-evaluated:Patient Re-evaluated prior to induction Oxygen Delivery Method: Circle system utilized Preoxygenation: Pre-oxygenation with 100% oxygen Induction Type: IV induction Ventilation: Mask ventilation without difficulty LMA: LMA inserted LMA Size: 5.0 Tube type: Oral Number of attempts: 1 Placement Confirmation: positive ETCO2 and breath sounds checked- equal and bilateral Tube secured with: Tape Dental Injury: Teeth and Oropharynx as per pre-operative assessment

## 2021-10-18 NOTE — Brief Op Note (Signed)
10/18/2021  11:11 AM  PATIENT:  Wesley [name]  65 y.o. male  PRE-OPERATIVE DIAGNOSIS:  LEFT RENAL PELVIS CANCER  POST-OPERATIVE DIAGNOSIS:  LEFT RENAL PELVIS CANCER  PROCEDURE:  Procedure(s): CYSTOSCOPY/ BILATERAL RETROGRADE/ LEFT URETEROSCOPY/ BIOPSY OF LEFT RENAL PELVIS (Bilateral) HOLMIUM LASER APPLICATION (Left)  SURGEON:  Surgeon(s) and Role:    Alexis Frock, MD - Primary  PHYSICIAN ASSISTANT:   ASSISTANTS: none   ANESTHESIA:   general  EBL:  minimal   BLOOD ADMINISTERED:none  DRAINS: none   LOCAL MEDICATIONS USED:  NONE  SPECIMEN:  Source of Specimen:  tiny fragmetns of left upper pole renal pelvis tumor  DISPOSITION OF SPECIMEN:  PATHOLOGY  COUNTS:  YES  TOURNIQUET:  * No tourniquets in log *  DICTATION: .Other Dictation: Dictation Number [id number]  PLAN OF CARE: Discharge to home after PACU  PATIENT DISPOSITION:  PACU - hemodynamically stable.   Delay start of Pharmacological VTE agent (>24hrs) due to surgical blood loss or risk of bleeding: yes
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
## 2021-10-18 NOTE — Anesthesia Preprocedure Evaluation (Addendum)
Anesthesia Evaluation  Patient identified by MRN, date of birth, ID band Patient awake    Reviewed: Allergy & Precautions, NPO status , Patient's Chart, lab work & pertinent test results  Airway Mallampati: II  TM Distance: >3 FB Neck ROM: Full    Dental  (+) Missing,    Pulmonary sleep apnea , COPD, Current Smoker and Patient abstained from smoking.,     + decreased breath sounds      Cardiovascular hypertension, + CAD  + pacemaker  Rhythm:Regular Rate:Normal     Neuro/Psych  Headaches, Seizures -,  PSYCHIATRIC DISORDERS Anxiety Depression Dementia CVA    GI/Hepatic Neg liver ROS, GERD  Medicated,  Endo/Other  diabetes, Type 2, Insulin Dependent, Oral Hypoglycemic Agents  Renal/GU      Musculoskeletal  (+) Arthritis ,   Abdominal   Peds  Hematology negative hematology ROS (+)   Anesthesia Other Findings   Reproductive/Obstetrics                            Anesthesia Physical Anesthesia Plan  ASA: 3  Anesthesia Plan: General   Post-op Pain Management:    Induction: Intravenous  PONV Risk Score and Plan: 2 and Ondansetron, Dexamethasone and Midazolam  Airway Management Planned: LMA  Additional Equipment:   Intra-op Plan:   Post-operative Plan: Extubation in OR  Informed Consent:   Plan Discussed with: CRNA  Anesthesia Plan Comments:         Anesthesia Quick Evaluation

## 2021-10-18 NOTE — Anesthesia Postprocedure Evaluation (Signed)
Anesthesia Post Note  Patient: Wesley [name]  Procedure(s) Performed: CYSTOSCOPY/ BILATERAL RETROGRADE/ LEFT URETEROSCOPY/ BIOPSY OF LEFT RENAL PELVIS (Bilateral: Ureter) HOLMIUM LASER APPLICATION (Left: Urethra)     Patient location during evaluation: PACU Anesthesia Type: General Level of consciousness: awake and alert Pain management: pain level controlled Vital Signs Assessment: post-procedure vital signs reviewed and stable Respiratory status: spontaneous breathing, nonlabored ventilation, respiratory function stable and patient connected to nasal cannula oxygen Cardiovascular status: blood pressure returned to baseline and stable Postop Assessment: no apparent nausea or vomiting Anesthetic complications: no Comments: - [name] will resume home HTN and diabetes after discharge    No notable events documented.  Last Vitals:  Vitals:   10/18/21 1145 10/18/21 1200  BP: (!) 151/104 (!) 164/93  Pulse: 61 63  Resp: 17   Temp:    SpO2: 93% 93%    Last Pain:  Vitals:   10/18/21 1120  TempSrc:   PainSc: 0-No pain                 Effie Berkshire

## 2021-10-19 ENCOUNTER — Encounter (HOSPITAL_COMMUNITY): Payer: Self-pay | Admitting: Urology

## 2021-10-21 LAB — SURGICAL PATHOLOGY

## 2021-10-21 NOTE — Op Note (Signed)
NAMEWILLY, Wesley [name] MEDICAL RECORD NO: [id number] ACCOUNT NO: [id number] DATE OF BIRTH: 08-08-1956 FACILITY: [facility] LOCATION: [facility] PHYSICIAN: Alexis Frock, MD  Operative Report   DATE OF PROCEDURE: 10/18/2021  PREOPERATIVE DIAGNOSIS:  History of left renal pelvis tumor.  POSTOPERATIVE DIAGNOSIS:  Recurrent left renal pelvis tumor.  PROCEDURE PERFORMED:    1.  Cystoscopy with bilateral retrograde pyelograms. 2.  Left ureteroscopy with biopsy renal pelvis tumor. 3.  Laser ablation, left renal pelvis tumor.  ESTIMATED BLOOD LOSS:  Nil.  COMPLICATIONS:  None.  SPECIMEN: Tiny fragments of left renal pelvis papillary tissue upper pole for permanent pathology.  FINDINGS:    1.  Unremarkable urinary bladder. 2.  Unremarkable bilateral retrograde pyelograms. 3.  Small volume recurrence of left upper pole renal pelvis tumor, estimated volume approximately 1.5 cm.  All accessible tumor ablated in urinary bladder.  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  He underwent a staged ablation several years ago.  He has been very compliant with surveillance following this.   He is due for surveillance and presents for this today.  Informed consent was obtained and placed on medical record.   PROCEDURE IN DETAIL:  The patient being Caid Radin, procedure was verified to be cystoscopy, bilateral retrograde pyelograms, left ureteroscopy, possible biopsy and tumor ablation was confirmed.  The procedure timeout was performed and intravenous  antibiotics administered.  General anesthesia was induced.   The patient was placed in the low lithotomy position.  A sterile field was created.  Prepped and draped the patient's penis, perineum, proximal thighs using iodine. Cystourethroscopy was performed using a 21-French rigid cystoscope with offset lens.   Inspection of anterior and posterior  urethra was unremarkable.  Inspection of bladder revealed no diverticula, calcifications, papillary lesions.  The right ureteral orifice was cannulated with a 6-French Foley catheter and right retrograde pyelogram was  obtained..  Right retrograde pyelogram demonstrated a single right ureter, single system right kidney.  No obvious filling defects or narrowing noted. Similarly, left retrograde pyelogram was obtained.  Left retrograde pyelogram demonstrated a single left ureter, single system left kidney.  No obvious filling defects or narrowing noted.  Marland Kitchen038ZIPwire was placed over both sides as a safety wire. 23F feeding tube was placed in bladder and pressure released.  Semi-rigid ureteroscopy was performed of the distal half of the left ureter. and the semirigid scope was exchanged for a single channel digtital ureteroscopy  was  then performed in the left renal pelvis.  There was small volume papillary tumor noted in the extreme upper pole.  This is the same side as his prior tumor.  Estimated total volume approximately 1.5 cm.  This was relatively subtle.  Photodocumentation  performed.   Next biopsy was performed using a backloaded BIGopsy times several passes; however, this was fairly low yield.  An Escape basket was then used to snare small pieces of the papillary tumor and several tiny fragments were obtained and sent aside for  permanent pathology.  Holmium laser energy applied to all visible aspects of papillary tumor.  Using settings of 1 joule and 5 Hz and using an ablative technique with purposeful deep focusing of the laser.  All the visible papillary tissue was ablated.  Notably, there was  some acute angulation of the upper pole calix.  All accessible tumor was controlled in this fashion. I was quite happy with the ablation.  Ureteroscope was then  removed under continuous vision .  No significant mucosal membranes were found and a new 5 x 26 Polaris stent  was placed over a safety wire  using fluoroscopic guidance and good proximal and distal plane were noted. The procedure was terminated.  The patient tolerated the procedure well.  No immediate perioperative complications. The patient was taken to the  postanesthesia care in stable condition.  PLAN: Discharge home.  Follow up in the office and likely a second stage versus repeat endoscopic exam for possible second stage ablation in approximately 6 weeks.   MUK D: 10/18/2021 11:17:27 am T: 10/19/2021 1:09:00 am  JOB: [id number]/ [id number]

## 2021-10-23 ENCOUNTER — Other Ambulatory Visit: Payer: Self-pay

## 2021-10-23 ENCOUNTER — Emergency Department (HOSPITAL_COMMUNITY)
Admission: EM | Admit: 2021-10-23 | Discharge: 2021-10-24 | Disposition: A | Discharge status: Home / Self Care | Payer: No Typology Code available for payment source | Attending: Emergency Medicine | Admitting: Emergency Medicine

## 2021-10-23 ENCOUNTER — Emergency Department (HOSPITAL_COMMUNITY): Payer: No Typology Code available for payment source

## 2021-10-23 ENCOUNTER — Encounter (HOSPITAL_COMMUNITY): Payer: Self-pay

## 2021-10-23 DIAGNOSIS — R1030 Lower abdominal pain, unspecified: Secondary | ICD-10-CM | POA: Diagnosis present

## 2021-10-23 DIAGNOSIS — F17210 Nicotine dependence, cigarettes, uncomplicated: Secondary | ICD-10-CM | POA: Diagnosis not present

## 2021-10-23 DIAGNOSIS — Z7982 Long term (current) use of aspirin: Secondary | ICD-10-CM | POA: Diagnosis not present

## 2021-10-23 DIAGNOSIS — E119 Type 2 diabetes mellitus without complications: Secondary | ICD-10-CM | POA: Insufficient documentation

## 2021-10-23 DIAGNOSIS — R109 Unspecified abdominal pain: Secondary | ICD-10-CM

## 2021-10-23 DIAGNOSIS — B349 Viral infection, unspecified: Secondary | ICD-10-CM | POA: Diagnosis not present

## 2021-10-23 DIAGNOSIS — I2510 Atherosclerotic heart disease of native coronary artery without angina pectoris: Secondary | ICD-10-CM | POA: Insufficient documentation

## 2021-10-23 DIAGNOSIS — Z79899 Other long term (current) drug therapy: Secondary | ICD-10-CM | POA: Diagnosis not present

## 2021-10-23 DIAGNOSIS — Z20822 Contact with and (suspected) exposure to covid-19: Secondary | ICD-10-CM | POA: Diagnosis not present

## 2021-10-23 DIAGNOSIS — I10 Essential (primary) hypertension: Secondary | ICD-10-CM | POA: Insufficient documentation

## 2021-10-23 DIAGNOSIS — Z7984 Long term (current) use of oral hypoglycemic drugs: Secondary | ICD-10-CM | POA: Diagnosis not present

## 2021-10-23 DIAGNOSIS — F0390 Unspecified dementia without behavioral disturbance: Secondary | ICD-10-CM | POA: Diagnosis not present

## 2021-10-23 DIAGNOSIS — Z85528 Personal history of other malignant neoplasm of kidney: Secondary | ICD-10-CM | POA: Insufficient documentation

## 2021-10-23 DIAGNOSIS — J449 Chronic obstructive pulmonary disease, unspecified: Secondary | ICD-10-CM | POA: Diagnosis not present

## 2021-10-23 DIAGNOSIS — Z794 Long term (current) use of insulin: Secondary | ICD-10-CM | POA: Insufficient documentation

## 2021-10-23 DIAGNOSIS — R5381 Other malaise: Secondary | ICD-10-CM

## 2021-10-23 LAB — TROPONIN I (HIGH SENSITIVITY): Troponin I (High Sensitivity): 3 ng/L (ref <18)

## 2021-10-23 LAB — COMPREHENSIVE METABOLIC PANEL
ALT: 30 U/L (ref 0–44)
AST: 16 U/L (ref 15–41)
Albumin: 4.5 g/dL (ref 3.5–5.0)
Alkaline Phosphatase: 165 U/L — ABNORMAL HIGH (ref 38–126)
Anion gap: 13 (ref 5–15)
BUN: 15 mg/dL (ref 8–23)
CO2: 25 mmol/L (ref 22–32)
Calcium: 9.6 mg/dL (ref 8.9–10.3)
Chloride: 95 mmol/L — ABNORMAL LOW (ref 98–111)
Creatinine, Ser: 1.08 mg/dL (ref 0.61–1.24)
GFR, Estimated: >60 mL/min (ref >60)
Glucose, Bld: 342 mg/dL — ABNORMAL HIGH (ref 70–99)
Potassium: 3.8 mmol/L (ref 3.5–5.1)
Sodium: 133 mmol/L — ABNORMAL LOW (ref 135–145)
Total Bilirubin: 0.5 mg/dL (ref 0.3–1.2)
Total Protein: 8.1 g/dL (ref 6.5–8.1)

## 2021-10-23 LAB — CBC WITH DIFFERENTIAL/PLATELET
Abs Immature Granulocytes: 0.04 10*3/uL (ref 0.00–0.07)
Basophils Absolute: 0 10*3/uL (ref 0.0–0.1)
Basophils Relative: 0 %
Eosinophils Absolute: 0.1 10*3/uL (ref 0.0–0.5)
Eosinophils Relative: 2 %
HCT: 46.2 % (ref 39.0–52.0)
Hemoglobin: 15 g/dL (ref 13.0–17.0)
Immature Granulocytes: 1 %
Lymphocytes Relative: 36 %
Lymphs Abs: 3.2 10*3/uL (ref 0.7–4.0)
MCH: 27.5 pg (ref 26.0–34.0)
MCHC: 32.5 g/dL (ref 30.0–36.0)
MCV: 84.6 fL (ref 80.0–100.0)
Monocytes Absolute: 0.5 10*3/uL (ref 0.1–1.0)
Monocytes Relative: 5 %
Neutro Abs: 5 10*3/uL (ref 1.7–7.7)
Neutrophils Relative %: 56 %
Platelets: 211 10*3/uL (ref 150–400)
RBC: 5.46 MIL/uL (ref 4.22–5.81)
RDW: 13.7 % (ref 11.5–15.5)
WBC: 8.8 10*3/uL (ref 4.0–10.5)
nRBC: 0 % (ref 0.0–0.2)

## 2021-10-23 LAB — URINALYSIS, ROUTINE W REFLEX MICROSCOPIC
Bilirubin Urine: NEGATIVE
Glucose, UA: >=500 mg/dL — AB
Ketones, ur: NEGATIVE mg/dL
Nitrite: NEGATIVE
Protein, ur: 100 mg/dL — AB
RBC / HPF: >50 RBC/hpf — ABNORMAL HIGH (ref 0–5)
Specific Gravity, Urine: 1.015 (ref 1.005–1.030)
pH: 5 (ref 5.0–8.0)

## 2021-10-23 LAB — D-DIMER, QUANTITATIVE: D-Dimer, Quant: 0.63 ug/mL-FEU — ABNORMAL HIGH (ref 0.00–0.50)

## 2021-10-23 LAB — BLOOD GAS, VENOUS
Acid-Base Excess: 1 mmol/L (ref 0.0–2.0)
Bicarbonate: 25.5 mmol/L (ref 20.0–28.0)
O2 Saturation: 85.9 %
Patient temperature: 98.6
pCO2, Ven: 41.9 mmHg — ABNORMAL LOW (ref 44.0–60.0)
pH, Ven: 7.401 (ref 7.250–7.430)
pO2, Ven: 52 mmHg — ABNORMAL HIGH (ref 32.0–45.0)

## 2021-10-23 LAB — RESP PANEL BY RT-PCR (FLU A&B, COVID) ARPGX2
Influenza A by PCR: NEGATIVE
Influenza B by PCR: NEGATIVE
SARS Coronavirus 2 by RT PCR: NEGATIVE

## 2021-10-23 LAB — CBG MONITORING, ED: Glucose-Capillary: 367 mg/dL — ABNORMAL HIGH (ref 70–99)

## 2021-10-23 MED ORDER — IOHEXOL 350 MG/ML SOLN
80.0000 mL | Freq: Once | INTRAVENOUS | Status: AC | PRN
  Administered 2021-10-23: 80 mL via INTRAVENOUS

## 2021-10-23 NOTE — ED Provider Notes (Signed)
Emergency Medicine Provider Triage Evaluation Note  Wesley [name] , a 65 y.o. male  was evaluated in triage.  Pt presenting with his wife due to the complaint of shortness of breath, urinary problems and decreased consciousness.  Patient had a urinary stent placed on Friday and has been acting differently ever since.  History of stroke, ACS, pyelonephritis, sepsis Review of Systems  Positive: Shortness of breath, urinary discomfort Negative:   Physical Exam  BP (!) 138/97 (BP Location: Left Arm)   Pulse 95   Temp 98.7 F (37.1 C) (Oral)   Resp 18   Ht 6\' 2"  (1.88 m)   Wt 95.3 kg   SpO2 95%   BMI 26.96 kg/m  Gen:   Awake, no distress   Resp:  Normal effort  MSK:   Moves extremities without difficulty  Other:  RRR, wheezes, junky cough  Medical Decision Making  Medically screening exam initiated at 8:27 PM.  Appropriate orders placed.  BECKHAM CAPISTRAN was informed that the remainder of the evaluation will be completed by another provider, this initial triage assessment does not replace that evaluation, and the importance of remaining in the ED until their evaluation is complete.     Rhae Hammock, PA-C 10/23/21 2029    Jeanell Sparrow, DO 10/25/21 [phone]

## 2021-10-23 NOTE — ED Triage Notes (Signed)
Pt reports increasing shob for several days. Wife came home and pt was somewhat lethargic. Sts having a urethral stent placed earlier this week and has been having increased urinary frequency.

## 2021-10-23 NOTE — ED Provider Notes (Signed)
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  Final Clinical Impression(s) / ED Diagnoses Final diagnoses:  Abdominal pain, unspecified abdominal location  Viral syndrome  Malaise    Rx / DC Orders ED Discharge Orders          Ordered    sucralfate (CARAFATE) 1 g tablet  3 times daily with meals & bedtime,   Status:  Discontinued        10/24/21 0012    dicyclomine (BENTYL) 20 MG tablet  2 times daily PRN,   Status:  Discontinued        10/24/21 0012    dicyclomine (BENTYL) 20 MG tablet  2 times daily PRN        10/24/21 0102     sucralfate (CARAFATE) 1 g tablet  3 times daily with meals & bedtime        10/24/21 0102             Jeanell Sparrow, DO 10/24/21 0122

## 2021-10-24 LAB — URINE CULTURE: Culture: NO GROWTH

## 2021-10-24 LAB — TROPONIN I (HIGH SENSITIVITY): Troponin I (High Sensitivity): 3 ng/L (ref <18)

## 2021-10-24 LAB — CBG MONITORING, ED: Glucose-Capillary: 189 mg/dL — ABNORMAL HIGH (ref 70–99)

## 2021-10-24 MED ORDER — DICYCLOMINE HCL 10 MG PO CAPS
10.0000 mg | ORAL_CAPSULE | Freq: Once | ORAL | Status: AC
  Administered 2021-10-24: 10 mg via ORAL
  Filled 2021-10-24: qty 1

## 2021-10-24 MED ORDER — DICYCLOMINE HCL 20 MG PO TABS: 10.0000 mg | ORAL_TABLET | Freq: Two times a day (BID) | ORAL | 0 refills | Status: DC | PRN

## 2021-10-24 MED ORDER — SUCRALFATE 1 G PO TABS: 1.0000 g | ORAL_TABLET | Freq: Three times a day (TID) | ORAL | 0 refills | Status: DC

## 2021-10-24 NOTE — Discharge Instructions (Signed)
  The etiology of your abdominal pain is unclear.    There are many causes of abdominal pain. Most pain is not serious and goes away, but some pain gets worse, changes, or will not go away. Please return to the emergency department or see your doctor right away if you (or your family member) experience any of the following:  1. Pain that gets worse or moves to just one spot.  2. Pain that gets worse if you cough or sneeze.  3. Pain with going over a bump in the road.  4. Pain that does not get better in 24 hours.  5. Inability to keep down liquids (vomiting)-especially if you are making less urine.  6. Fainting.  7. Blood in the vomit or stool.  8. High fever or shaking chills.  9. Swelling of the abdomen.  10. Any new or worsening problem.      Follow-up Instructions  Return to the emergency department for recheck if worse.  See your primary care provider if not completely better in the next 2-3 days. Come to the ED if you are unable to see them in this time frame.    Additional Instructions  No alcohol.  No caffeine, aspirin, or cigarettes.   Please return to the emergency department immediately for any new or concerning symptoms, or if you get worse.

## 2021-11-12 ENCOUNTER — Other Ambulatory Visit: Payer: Self-pay | Admitting: Urology

## 2021-11-20 ENCOUNTER — Encounter (HOSPITAL_COMMUNITY): Payer: Self-pay | Admitting: Oncology

## 2021-11-20 ENCOUNTER — Emergency Department (HOSPITAL_COMMUNITY)
Admission: EM | Admit: 2021-11-20 | Discharge: 2021-11-20 | Disposition: A | Discharge status: Home / Self Care | Payer: No Typology Code available for payment source | Attending: Emergency Medicine | Admitting: Emergency Medicine

## 2021-11-20 ENCOUNTER — Other Ambulatory Visit: Payer: Self-pay

## 2021-11-20 ENCOUNTER — Emergency Department (HOSPITAL_COMMUNITY): Payer: No Typology Code available for payment source

## 2021-11-20 DIAGNOSIS — K5900 Constipation, unspecified: Secondary | ICD-10-CM | POA: Diagnosis not present

## 2021-11-20 DIAGNOSIS — F0390 Unspecified dementia without behavioral disturbance: Secondary | ICD-10-CM | POA: Diagnosis not present

## 2021-11-20 DIAGNOSIS — R109 Unspecified abdominal pain: Secondary | ICD-10-CM

## 2021-11-20 DIAGNOSIS — R0602 Shortness of breath: Secondary | ICD-10-CM | POA: Diagnosis not present

## 2021-11-20 DIAGNOSIS — R079 Chest pain, unspecified: Secondary | ICD-10-CM | POA: Diagnosis not present

## 2021-11-20 DIAGNOSIS — R140 Abdominal distension (gaseous): Secondary | ICD-10-CM | POA: Insufficient documentation

## 2021-11-20 DIAGNOSIS — Z85528 Personal history of other malignant neoplasm of kidney: Secondary | ICD-10-CM | POA: Diagnosis not present

## 2021-11-20 DIAGNOSIS — J449 Chronic obstructive pulmonary disease, unspecified: Secondary | ICD-10-CM | POA: Diagnosis not present

## 2021-11-20 DIAGNOSIS — Z950 Presence of cardiac pacemaker: Secondary | ICD-10-CM | POA: Diagnosis not present

## 2021-11-20 DIAGNOSIS — F17210 Nicotine dependence, cigarettes, uncomplicated: Secondary | ICD-10-CM | POA: Diagnosis not present

## 2021-11-20 DIAGNOSIS — I2510 Atherosclerotic heart disease of native coronary artery without angina pectoris: Secondary | ICD-10-CM | POA: Diagnosis not present

## 2021-11-20 DIAGNOSIS — E1169 Type 2 diabetes mellitus with other specified complication: Secondary | ICD-10-CM | POA: Diagnosis not present

## 2021-11-20 DIAGNOSIS — Z794 Long term (current) use of insulin: Secondary | ICD-10-CM | POA: Insufficient documentation

## 2021-11-20 DIAGNOSIS — R110 Nausea: Secondary | ICD-10-CM | POA: Diagnosis not present

## 2021-11-20 DIAGNOSIS — Z7982 Long term (current) use of aspirin: Secondary | ICD-10-CM | POA: Diagnosis not present

## 2021-11-20 DIAGNOSIS — Z7984 Long term (current) use of oral hypoglycemic drugs: Secondary | ICD-10-CM | POA: Insufficient documentation

## 2021-11-20 DIAGNOSIS — K219 Gastro-esophageal reflux disease without esophagitis: Secondary | ICD-10-CM | POA: Insufficient documentation

## 2021-11-20 LAB — COMPREHENSIVE METABOLIC PANEL WITH GFR
ALT: 20 U/L (ref 0–44)
AST: 16 U/L (ref 15–41)
Albumin: 4.6 g/dL (ref 3.5–5.0)
Alkaline Phosphatase: 160 U/L — ABNORMAL HIGH (ref 38–126)
Anion gap: 11 (ref 5–15)
BUN: 13 mg/dL (ref 8–23)
CO2: 19 mmol/L — ABNORMAL LOW (ref 22–32)
Calcium: 9.8 mg/dL (ref 8.9–10.3)
Chloride: 107 mmol/L (ref 98–111)
Creatinine, Ser: 0.85 mg/dL (ref 0.61–1.24)
GFR, Estimated: >60 mL/min
Glucose, Bld: 153 mg/dL — ABNORMAL HIGH (ref 70–99)
Potassium: 3.9 mmol/L (ref 3.5–5.1)
Sodium: 137 mmol/L (ref 135–145)
Total Bilirubin: 0.8 mg/dL (ref 0.3–1.2)
Total Protein: 8.2 g/dL — ABNORMAL HIGH (ref 6.5–8.1)

## 2021-11-20 LAB — CBC
HCT: 48.6 % (ref 39.0–52.0)
Hemoglobin: 15 g/dL (ref 13.0–17.0)
MCH: 27.1 pg (ref 26.0–34.0)
MCHC: 30.9 g/dL (ref 30.0–36.0)
MCV: 87.7 fL (ref 80.0–100.0)
Platelets: 192 K/uL (ref 150–400)
RBC: 5.54 MIL/uL (ref 4.22–5.81)
RDW: 14 % (ref 11.5–15.5)
WBC: 7.2 K/uL (ref 4.0–10.5)
nRBC: 0 % (ref 0.0–0.2)

## 2021-11-20 LAB — TROPONIN I (HIGH SENSITIVITY)
Troponin I (High Sensitivity): 3 ng/L
Troponin I (High Sensitivity): 2 ng/L (ref <18)

## 2021-11-20 LAB — LIPASE, BLOOD: Lipase: 34 U/L (ref 11–51)

## 2021-11-20 MED ORDER — IOHEXOL 350 MG/ML SOLN
100.0000 mL | Freq: Once | INTRAVENOUS | Status: AC | PRN
  Administered 2021-11-20: 100 mL via INTRAVENOUS

## 2021-11-20 MED ORDER — PANTOPRAZOLE SODIUM 20 MG PO TBEC: 20.0000 mg | DELAYED_RELEASE_TABLET | Freq: Every day | ORAL | 0 refills | Status: DC

## 2021-11-20 MED ORDER — MORPHINE SULFATE (PF) 4 MG/ML IV SOLN
4.0000 mg | Freq: Once | INTRAVENOUS | Status: AC
  Administered 2021-11-20: 4 mg via INTRAVENOUS
  Filled 2021-11-20: qty 1

## 2021-11-20 MED ORDER — LIDOCAINE VISCOUS HCL 2 % MT SOLN
15.0000 mL | Freq: Once | OROMUCOSAL | Status: AC
  Administered 2021-11-20: 15 mL via ORAL
  Filled 2021-11-20: qty 15

## 2021-11-20 MED ORDER — SENNOSIDES-DOCUSATE SODIUM 8.6-50 MG PO TABS: 2.0000 | ORAL_TABLET | Freq: Every day | ORAL | 0 refills | Status: AC

## 2021-11-20 MED ORDER — SUCRALFATE 1 GM/10ML PO SUSP: 1.0000 g | Freq: Three times a day (TID) | ORAL | 0 refills | Status: DC

## 2021-11-20 MED ORDER — ALUM & MAG HYDROXIDE-SIMETH 200-200-20 MG/5ML PO SUSP
30.0000 mL | Freq: Once | ORAL | Status: AC
  Administered 2021-11-20: 30 mL via ORAL
  Filled 2021-11-20: qty 30

## 2021-11-20 MED ORDER — SODIUM CHLORIDE 0.9 % IV BOLUS
1000.0000 mL | Freq: Once | INTRAVENOUS | Status: AC
  Administered 2021-11-20: 1000 mL via INTRAVENOUS

## 2021-11-20 MED ORDER — SUCRALFATE 1 GM/10ML PO SUSP
1.0000 g | Freq: Once | ORAL | Status: AC
  Administered 2021-11-20: 1 g via ORAL
  Filled 2021-11-20: qty 10

## 2021-11-20 MED ORDER — ONDANSETRON HCL 4 MG/2ML IJ SOLN
4.0000 mg | Freq: Once | INTRAMUSCULAR | Status: AC
  Administered 2021-11-20: 4 mg via INTRAVENOUS
  Filled 2021-11-20: qty 2

## 2021-11-20 NOTE — Discharge Instructions (Addendum)
Take carafate and protonix as directed   I have also prescribed Senokot to help with your constipation   Please follow up with your gastroenterology doctor within the next 5-7 days.    Please return to the ER sooner if you have any new or worsening symptoms, or if you have any of the following symptoms:  Abdominal pain that does not go away.  You have a fever.  You keep throwing up (vomiting).  The pain is felt only in portions of the abdomen. Pain in the right side could possibly be appendicitis. In an adult, pain in the left lower portion of the abdomen could be colitis or diverticulitis.  You pass bloody or black tarry stools.  There is bright red blood in the stool.  The constipation stays for more than 4 days.  There is belly (abdominal) or rectal pain.  You do not seem to be getting better.  You have any questions or concerns.

## 2021-11-20 NOTE — ED Provider Notes (Signed)
Emergency Medicine Provider Triage Evaluation Note  Wesley [name] , a 65 y.o. male  was evaluated in triage.  Pt complains of abd pain.  Review of Systems  Positive: Abd pain, cp, nausea, sob Negative: Fever, cough, dysuria  Physical Exam  BP (!) 136/102 (BP Location: Left Arm)   Pulse 93   Temp 97.9 F (36.6 C) (Oral)   Resp 18   Ht 6\' 2"  (1.88 m)   Wt 90.7 kg   SpO2 99%   BMI 25.68 kg/m  Gen:   Awake, no distress   Resp:  Normal effort  MSK:   Moves extremities without difficulty  Other:    Medical Decision Making  Medically screening exam initiated at 11:38 AM.  Appropriate orders placed.  Wesley [name] was informed that the remainder of the evaluation will be completed by another provider, this initial triage assessment does not replace that evaluation, and the importance of remaining in the ED until their evaluation is complete.  Abd pain x 4 days now radiates to mid chest ,trouble swallowing, sob and nauseous.     Wesley Moras, PA-C 11/20/21 1139    Wesley Sorrow, MD 11/21/21 1124

## 2021-11-20 NOTE — ED Notes (Signed)
An After Visit Summary was printed and given to the patient. Discharge instructions given and no further questions at this time.  Pt leaving with family.  

## 2021-11-20 NOTE — ED Triage Notes (Signed)
Pt c/o abdominal pain x 4 days.  Reports he now has pain in his chest and abdomen.  +nausea, shob.

## 2021-11-20 NOTE — ED Provider Notes (Signed)
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 dissection, his cardiac markers are within normal limits.  I doubt PE.  It he did not have any emergent intra-abdominal or thoracic findings on his CT scan.  On reassessment he states that he feels improved and has been able to tolerate p.o.  Discussed the incidental findings as well as that we did not find any emergent diagnoses tonight.  My suspicion is that he may have gastritis and esophagitis especially with his history of GERD.  I will prescribe Carafate as well as Protonix and I have recommended that he follow-up with his GI doctor for  reassessment of symptoms and possible endoscopy.  He and wife at bedside voiced understanding of the plan and are in agreement.  I did discuss return precautions and they voiced understanding as well.  All questions answered.  Patient stable for discharge.   Final Clinical Impression(s) / ED Diagnoses Final diagnoses:  Abdominal pain, unspecified abdominal location    Rx / DC Orders ED Discharge Orders          Ordered    senna-docusate (SENOKOT-S) 8.6-50 MG tablet  Daily        11/20/21 2202    pantoprazole (PROTONIX) 20 MG tablet  Daily        11/20/21 2202    sucralfate (CARAFATE) 1 GM/10ML suspension  3 times daily with meals & bedtime        11/20/21 2202             Rodney Booze, PA-C 11/20/21 2205    Lorelle Gibbs, DO 11/20/21 2336

## 2021-11-20 NOTE — ED Notes (Signed)
Patient transported to CT 

## 2021-11-21 NOTE — Progress Notes (Addendum)
Anesthesia Review:  [facility]: [facility] Cardiologist : [facility]io [facility] 06/19/21 OV note on chart  06/20/21- DR Joan Mayans- LOV note on chart- EP  Neurology- followed by [facility] in [place] 3 months ago per wife  Pacemaker- ST Jude - last check- 05/20/21- on chart  Requested device orders be completed on 12/05/21 for 3rd time.  Faxed to [facility] orders on chart- 12/05/21.   Chest x-ray : 10/23/21  Ct Chest- 11/20/21  Last Device Check - 2018  EKG : 11/20/21  ekg-06/19/21 on chart from [place]  Echo : 2020  Stress test: Cardiac Cath :  Activity level: cannot - walks thru house at home and to mailbox per wife has cane, walker and scooter  Sleep Study/ CPAP : Fasting Blood Sugar :      / Checks Blood Sugar -- times a day:   Blood Thinner/ Instructions /Last Dose: ASA / Instructions/ Last Dose :   DM- type 2 Hgba1c-  10/11/21-7.7  Checks glucose once daily  325 mg aspirin  In ED 11/20/21 for abdominal pain  CBC and CMP done 11/20/21-  On Friday 11/22/2021 spoke with wife and set up appt to be phone call for 11/25/21 at 1000am instead of coming in for preop appt.  Called at 1000am and LVMM.  Called at 1017 and LVMM.  Called at 1033 am and LVMM on 11/25/2021.  Finally spoke with wife on 11/25/21 at 1140am.  Hx and preop instructions completed.   Smoker  Informed wife at preop phone call that we would need an Authorizaiton for Release of info to obtain info from [place].  Wife to call nurse back about when she can come by to sign that and/or wife is calling to [place] to speak with them about releasing info to [facility].  Wife to call nurse back in regards to this.   Hx of stroke- right sided weakness Wife called on 11/29/2021 and stated she was on way to hospital to sign medical release for [facility] [place].  Wife arrive and pickup up instructions and soap and signed Medical Release Form.  Form then faxed to [facility] requesting medical records needed and periop  device form sent to be completed.  Confirmation received.  Rerequested info from [place] on 12/04/21.   Have requested for 3rd time for device periop orders to be completed and faxed back.

## 2021-11-21 NOTE — Progress Notes (Signed)
Wesley [name]  11/21/2021   Your procedure is scheduled on:  12/11/2021   Report to [facility] Main  Entrance   Report to admitting at   1115 AM     Call this number if you have problems the morning of surgery [phone]    Remember: Do not eat food , candy gum or mints :After Midnight. You may have clear liquids from midnight until __ 1030   CLEAR LIQUID DIET   Foods Allowed                                                                       Coffee and tea, regular and decaf                              Plain Jell-O any favor except red or purple                                            Fruit ices (not with fruit pulp)                                      Iced Popsicles                                     Carbonated beverages, regular and diet                                    Cranberry, grape and apple juices Sports drinks like Gatorade Lightly seasoned clear broth or consume(fat free) Sugar   _____________________________________________________________________    BRUSH YOUR TEETH MORNING OF SURGERY AND RINSE YOUR MOUTH OUT, NO CHEWING GUM CANDY OR MINTS.     Take these medicines the morning of surgery with A SIP OF WATER:   protonix, sinemet, eye drops as usual, inhalers as usual and bring, keppra, gabapentin, proscar, myrbetriq, flomax, effexor   DO NOT TAKE ANY DIABETIC MEDICATIONS DAY OF YOUR SURGERY                               You may not have any metal on your body including hair pins and              piercings  Do not wear jewelry, make-up, lotions, powders or perfumes, deodorant             Do not wear nail polish on your fingernails.  Do not shave  48 hours prior to surgery.              Men may shave face and neck.   Do not bring valuables to the hospital. [facility] IS NOT  RESPONSIBLE   FOR VALUABLES.  Contacts, dentures or bridgework may not be worn into surgery.  Leave suitcase in the car. After  surgery it may be brought to your room.     Patients discharged the day of surgery will not be allowed to drive home. IF YOU ARE HAVING SURGERY AND GOING HOME THE SAME DAY, YOU MUST HAVE AN ADULT TO DRIVE YOU HOME AND BE WITH YOU FOR 24 HOURS. YOU MAY GO HOME BY TAXI OR UBER OR ORTHERWISE, BUT AN ADULT MUST ACCOMPANY YOU HOME AND STAY WITH YOU FOR 24 HOURS.  Name and phone number of your driver:  Special Instructions: N/A              Please read over the following fact sheets you were given: _____________________________________________________________________  [facility] - Preparing for Surgery Before surgery, you can play an important role.  Because skin is not sterile, your skin needs to be as free of germs as possible.  You can reduce the number of germs on your skin by washing with CHG (chlorahexidine gluconate) soap before surgery.  CHG is an antiseptic cleaner which kills germs and bonds with the skin to continue killing germs even after washing. Please DO NOT use if you have an allergy to CHG or antibacterial soaps.  If your skin becomes reddened/irritated stop using the CHG and inform your nurse when you arrive at Short Stay. Do not shave (including legs and underarms) for at least 48 hours prior to the first CHG shower.  You may shave your face/neck. Please follow these instructions carefully:  1.  Shower with CHG Soap the night before surgery and the  morning of Surgery.  2.  If you choose to wash your hair, wash your hair first as usual with your  normal  shampoo.  3.  After you shampoo, rinse your hair and body thoroughly to remove the  shampoo.                           4.  Use CHG as you would any other liquid soap.  You can apply chg directly  to the skin and wash                       Gently with a scrungie or clean washcloth.  5.  Apply the CHG Soap to your body ONLY FROM THE NECK DOWN.   Do not use on face/ open                           Wound or open sores. Avoid contact  with eyes, ears mouth and genitals (private parts).                       Wash face,  Genitals (private parts) with your normal soap.             6.  Wash thoroughly, paying special attention to the area where your surgery  will be performed.  7.  Thoroughly rinse your body with warm water from the neck down.  8.  DO NOT shower/wash with your normal soap after using and rinsing off  the CHG Soap.                9.  Pat yourself dry with a clean towel.            10.  Wear clean pajamas.            11.  Place clean sheets on your bed the night of your first shower and do not  sleep with pets. Day of Surgery : Do not apply any lotions/deodorants the morning of surgery.  Please wear clean clothes to the hospital/surgery center.  FAILURE TO FOLLOW THESE INSTRUCTIONS MAY RESULT IN THE CANCELLATION OF YOUR SURGERY PATIENT SIGNATURE_________________________________  NURSE SIGNATURE__________________________________  ________________________________________________________________________

## 2021-11-25 ENCOUNTER — Encounter (HOSPITAL_COMMUNITY)
Admission: RE | Admit: 2021-11-25 | Discharge: 2021-11-25 | Disposition: A | Discharge status: Home / Self Care | Payer: No Typology Code available for payment source | Source: Ambulatory Visit

## 2021-11-25 ENCOUNTER — Other Ambulatory Visit: Payer: Self-pay

## 2021-11-25 ENCOUNTER — Encounter (HOSPITAL_COMMUNITY): Payer: Self-pay

## 2021-12-05 ENCOUNTER — Encounter (HOSPITAL_COMMUNITY): Payer: Self-pay | Admitting: Urology

## 2021-12-05 NOTE — Progress Notes (Signed)
Noted where pharmacy had updated med rec on 12/03/21.  Called and spoke with wife to make sure meds had not changed since preop phone call on 11/25/21.  Wife stated that meds have not changed since phone call of 11/25/21.  Wife states she still has copy of preop instructions given to her on 12/02/2021 by preop nurse.  Wife aware pt is to take no dm meds am of surgery and that pm dose of 70/30/ insulin nite before surgery pt it to only take 21 units of the 30 units dose.  Informed wife that all records from [place] had been received.  Wife voiced understanding.

## 2021-12-11 ENCOUNTER — Ambulatory Visit (HOSPITAL_COMMUNITY): Payer: No Typology Code available for payment source | Admitting: Physician Assistant

## 2021-12-11 ENCOUNTER — Encounter (HOSPITAL_COMMUNITY)
Admission: RE | Disposition: A | Discharge status: Home / Self Care | Payer: Self-pay | Source: Home / Self Care | Attending: Urology

## 2021-12-11 ENCOUNTER — Ambulatory Visit (HOSPITAL_COMMUNITY): Payer: No Typology Code available for payment source

## 2021-12-11 ENCOUNTER — Ambulatory Visit (HOSPITAL_COMMUNITY)
Admission: RE | Admit: 2021-12-11 | Discharge: 2021-12-11 | Disposition: A | Discharge status: Home / Self Care | Payer: No Typology Code available for payment source | Attending: Urology | Admitting: Urology

## 2021-12-11 ENCOUNTER — Encounter (HOSPITAL_COMMUNITY): Payer: Self-pay | Admitting: Urology

## 2021-12-11 DIAGNOSIS — F0390 Unspecified dementia without behavioral disturbance: Secondary | ICD-10-CM | POA: Insufficient documentation

## 2021-12-11 DIAGNOSIS — I2510 Atherosclerotic heart disease of native coronary artery without angina pectoris: Secondary | ICD-10-CM | POA: Diagnosis not present

## 2021-12-11 DIAGNOSIS — K219 Gastro-esophageal reflux disease without esophagitis: Secondary | ICD-10-CM | POA: Insufficient documentation

## 2021-12-11 DIAGNOSIS — I10 Essential (primary) hypertension: Secondary | ICD-10-CM | POA: Insufficient documentation

## 2021-12-11 DIAGNOSIS — Z8553 Personal history of malignant neoplasm of renal pelvis: Secondary | ICD-10-CM | POA: Insufficient documentation

## 2021-12-11 DIAGNOSIS — J449 Chronic obstructive pulmonary disease, unspecified: Secondary | ICD-10-CM | POA: Diagnosis not present

## 2021-12-11 DIAGNOSIS — F17210 Nicotine dependence, cigarettes, uncomplicated: Secondary | ICD-10-CM | POA: Diagnosis not present

## 2021-12-11 DIAGNOSIS — Z794 Long term (current) use of insulin: Secondary | ICD-10-CM | POA: Diagnosis not present

## 2021-12-11 DIAGNOSIS — R569 Unspecified convulsions: Secondary | ICD-10-CM | POA: Insufficient documentation

## 2021-12-11 DIAGNOSIS — F419 Anxiety disorder, unspecified: Secondary | ICD-10-CM | POA: Diagnosis not present

## 2021-12-11 DIAGNOSIS — Z7984 Long term (current) use of oral hypoglycemic drugs: Secondary | ICD-10-CM | POA: Diagnosis not present

## 2021-12-11 DIAGNOSIS — G4730 Sleep apnea, unspecified: Secondary | ICD-10-CM | POA: Insufficient documentation

## 2021-12-11 DIAGNOSIS — E119 Type 2 diabetes mellitus without complications: Secondary | ICD-10-CM | POA: Insufficient documentation

## 2021-12-11 DIAGNOSIS — F32A Depression, unspecified: Secondary | ICD-10-CM | POA: Insufficient documentation

## 2021-12-11 DIAGNOSIS — R519 Headache, unspecified: Secondary | ICD-10-CM | POA: Insufficient documentation

## 2021-12-11 DIAGNOSIS — Z950 Presence of cardiac pacemaker: Secondary | ICD-10-CM | POA: Diagnosis not present

## 2021-12-11 DIAGNOSIS — Z7982 Long term (current) use of aspirin: Secondary | ICD-10-CM | POA: Insufficient documentation

## 2021-12-11 DIAGNOSIS — M1990 Unspecified osteoarthritis, unspecified site: Secondary | ICD-10-CM | POA: Diagnosis not present

## 2021-12-11 DIAGNOSIS — N289 Disorder of kidney and ureter, unspecified: Secondary | ICD-10-CM | POA: Insufficient documentation

## 2021-12-11 DIAGNOSIS — I252 Old myocardial infarction: Secondary | ICD-10-CM | POA: Diagnosis not present

## 2021-12-11 DIAGNOSIS — Z8673 Personal history of transient ischemic attack (TIA), and cerebral infarction without residual deficits: Secondary | ICD-10-CM | POA: Diagnosis not present

## 2021-12-11 HISTORY — PX: CYSTOSCOPY WITH RETROGRADE PYELOGRAM, URETEROSCOPY AND STENT PLACEMENT: SHX5789

## 2021-12-11 HISTORY — DX: Other supraventricular tachycardia: I47.19

## 2021-12-11 HISTORY — DX: Bradycardia, unspecified: R00.1

## 2021-12-11 HISTORY — DX: Supraventricular tachycardia: I47.1

## 2021-12-11 LAB — GLUCOSE, CAPILLARY
Glucose-Capillary: 121 mg/dL — ABNORMAL HIGH (ref 70–99)
Glucose-Capillary: 94 mg/dL (ref 70–99)

## 2021-12-11 SURGERY — CYSTOURETEROSCOPY, WITH RETROGRADE PYELOGRAM AND STENT INSERTION
Anesthesia: General | Laterality: Left

## 2021-12-11 MED ORDER — DEXAMETHASONE SODIUM PHOSPHATE 10 MG/ML IJ SOLN
INTRAMUSCULAR | Status: AC
  Filled 2021-12-11: qty 1

## 2021-12-11 MED ORDER — AMISULPRIDE (ANTIEMETIC) 5 MG/2ML IV SOLN: 10.0000 mg | Freq: Once | INTRAVENOUS | Status: DC | PRN

## 2021-12-11 MED ORDER — GENTAMICIN SULFATE 40 MG/ML IJ SOLN
5.0000 mg/kg | INTRAVENOUS | Status: AC
  Administered 2021-12-11: 14:00:00 480 mg via INTRAVENOUS
  Filled 2021-12-11: qty 12

## 2021-12-11 MED ORDER — TRAMADOL HCL 50 MG PO TABS
50.0000 mg | ORAL_TABLET | Freq: Four times a day (QID) | ORAL | 0 refills | Status: AC | PRN
Start: 2021-12-11 — End: 2022-12-11

## 2021-12-11 MED ORDER — ONDANSETRON HCL 4 MG/2ML IJ SOLN
INTRAMUSCULAR | Status: AC
  Filled 2021-12-11: qty 2

## 2021-12-11 MED ORDER — LACTATED RINGERS IV SOLN: INTRAVENOUS | Status: DC

## 2021-12-11 MED ORDER — CHLORHEXIDINE GLUCONATE 0.12 % MT SOLN
15.0000 mL | OROMUCOSAL | Status: AC
  Administered 2021-12-11: 12:00:00 15 mL via OROMUCOSAL

## 2021-12-11 MED ORDER — FENTANYL CITRATE PF 50 MCG/ML IJ SOSY: 25.0000 ug | PREFILLED_SYRINGE | INTRAMUSCULAR | Status: DC | PRN

## 2021-12-11 MED ORDER — SODIUM CHLORIDE 0.9 % IR SOLN
Status: DC | PRN
  Administered 2021-12-11 (×2): 3000 mL via INTRAVESICAL

## 2021-12-11 MED ORDER — EPHEDRINE SULFATE-NACL 50-0.9 MG/10ML-% IV SOSY
PREFILLED_SYRINGE | INTRAVENOUS | Status: DC | PRN
  Administered 2021-12-11 (×2): 5 mg via INTRAVENOUS

## 2021-12-11 MED ORDER — ONDANSETRON HCL 4 MG/2ML IJ SOLN: 4.0000 mg | Freq: Once | INTRAMUSCULAR | Status: DC | PRN

## 2021-12-11 MED ORDER — SULFAMETHOXAZOLE-TRIMETHOPRIM 800-160 MG PO TABS: 1.0000 | ORAL_TABLET | Freq: Every day | ORAL | 0 refills | Status: DC

## 2021-12-11 MED ORDER — PROPOFOL 10 MG/ML IV BOLUS
INTRAVENOUS | Status: DC | PRN
  Administered 2021-12-11: 200 mg via INTRAVENOUS

## 2021-12-11 MED ORDER — LIDOCAINE 2% (20 MG/ML) 5 ML SYRINGE
INTRAMUSCULAR | Status: DC | PRN
  Administered 2021-12-11: 60 mg via INTRAVENOUS

## 2021-12-11 MED ORDER — PHENYLEPHRINE 40 MCG/ML (10ML) SYRINGE FOR IV PUSH (FOR BLOOD PRESSURE SUPPORT)
PREFILLED_SYRINGE | INTRAVENOUS | Status: AC
  Filled 2021-12-11: qty 10

## 2021-12-11 MED ORDER — PROPOFOL 10 MG/ML IV BOLUS
INTRAVENOUS | Status: AC
  Filled 2021-12-11: qty 20

## 2021-12-11 MED ORDER — FENTANYL CITRATE (PF) 100 MCG/2ML IJ SOLN
INTRAMUSCULAR | Status: DC | PRN
  Administered 2021-12-11: 50 ug via INTRAVENOUS

## 2021-12-11 MED ORDER — IOHEXOL 300 MG/ML  SOLN
INTRAMUSCULAR | Status: DC | PRN
  Administered 2021-12-11: 14:00:00 10 mL

## 2021-12-11 MED ORDER — ACETAMINOPHEN 10 MG/ML IV SOLN: 1000.0000 mg | Freq: Once | INTRAVENOUS | Status: DC | PRN

## 2021-12-11 MED ORDER — ONDANSETRON HCL 4 MG/2ML IJ SOLN
INTRAMUSCULAR | Status: DC | PRN
  Administered 2021-12-11: 4 mg via INTRAVENOUS

## 2021-12-11 MED ORDER — LIDOCAINE 2% (20 MG/ML) 5 ML SYRINGE
INTRAMUSCULAR | Status: AC
  Filled 2021-12-11: qty 5

## 2021-12-11 MED ORDER — DEXAMETHASONE SODIUM PHOSPHATE 4 MG/ML IJ SOLN
INTRAMUSCULAR | Status: DC | PRN
  Administered 2021-12-11: 5 mg via INTRAVENOUS

## 2021-12-11 MED ORDER — PHENYLEPHRINE 40 MCG/ML (10ML) SYRINGE FOR IV PUSH (FOR BLOOD PRESSURE SUPPORT)
PREFILLED_SYRINGE | INTRAVENOUS | Status: DC | PRN
  Administered 2021-12-11: 120 ug via INTRAVENOUS

## 2021-12-11 MED ORDER — FENTANYL CITRATE (PF) 100 MCG/2ML IJ SOLN
INTRAMUSCULAR | Status: AC
  Filled 2021-12-11: qty 2

## 2021-12-11 SURGICAL SUPPLY — 24 items
BAG URO CATCHER STRL LF (MISCELLANEOUS) ×3 IMPLANT
BASKET LASER NITINOL 1.9FR (BASKET) IMPLANT
CATH URETL OPEN END 6FR 70 (CATHETERS) ×3 IMPLANT
CLOTH BEACON ORANGE TIMEOUT ST (SAFETY) ×3 IMPLANT
EXTRACTOR STONE 1.7FRX115CM (UROLOGICAL SUPPLIES) IMPLANT
GLOVE SURG ENC TEXT LTX SZ7.5 (GLOVE) ×3 IMPLANT
GOWN STRL REUS W/TWL LRG LVL3 (GOWN DISPOSABLE) ×3 IMPLANT
GUIDEWIRE ANG ZIPWIRE 038X150 (WIRE) ×3 IMPLANT
GUIDEWIRE STR DUAL SENSOR (WIRE) ×3 IMPLANT
KIT TURNOVER KIT A (KITS) IMPLANT
LASER FIB FLEXIVA PULSE ID 365 (Laser) IMPLANT
LASER FIB FLEXIVA PULSE ID 550 (Laser) IMPLANT
LASER FIB FLEXIVA PULSE ID 910 (Laser) IMPLANT
MANIFOLD NEPTUNE II (INSTRUMENTS) ×3 IMPLANT
PACK CYSTO (CUSTOM PROCEDURE TRAY) ×3 IMPLANT
SHEATH NAVIGATOR HD 11/13X28 (SHEATH) IMPLANT
SHEATH NAVIGATOR HD 11/13X36 (SHEATH) IMPLANT
STENT POLARIS 5FRX26 (STENTS) ×2 IMPLANT
TRACTIP FLEXIVA PULS ID 200XHI (Laser) IMPLANT
TRACTIP FLEXIVA PULSE ID 200 (Laser)
TUBE FEEDING 8FR 16IN STR KANG (MISCELLANEOUS) ×3 IMPLANT
TUBING CONNECTING 10 (TUBING) ×2 IMPLANT
TUBING CONNECTING 10' (TUBING) ×1
TUBING UROLOGY SET (TUBING) ×3 IMPLANT

## 2021-12-11 NOTE — Op Note (Signed)
Wesley [name], GASPER MEDICAL RECORD NO: [id number] ACCOUNT NO: [id number] DATE OF BIRTH: 12/04/1956 FACILITY: [facility] LOCATION: [facility] PHYSICIAN: Alexis Frock, MD  Operative Report   PREOPERATIVE DIAGNOSIS:  Left renal pelvis cancer, recurrent.  POSTOPERATIVE DIAGNOSIS:  History of left renal pelvis cancer.  PROCEDURE PERFORMED: 1.  Cystoscopy with left retrograde pyelogram interpretation. 2.  Left diagnostic ureteroscopy. 3.  Exchange of left ureteral stent.  ESTIMATED BLOOD LOSS:  Nil.  COMPLICATIONS:  None.  SPECIMENS:  None.  DRAINS:  None.  FINDINGS: 1.  No evidence of residual papillary left renal pelvis tumor. 2.  Successful replacement of left ureteral stent, proximal end in renal pelvis and distal end in urinary bladder.  INDICATIONS:  The patient is a pleasant 65 year old man with a longstanding history of recurrent low-grade tumor of his left renal pelvis and left upper pole.  He is status post numerous endoscopic ablative procedures for this and he is very  compliant with surveillance.  He was found recently to have significant recurrence of tumor in his left upper pole and he underwent ureteroscopic ablation of this in 10/2021.  Given the volume tumor, he presents for restaging, possibly second stage ablation  today.  Informed consent was obtained and placed in the medical record.  PROCEDURE IN DETAIL:  The patient being verified and procedure being cystoscopy, left retrograde, left ureteroscopy with possible restaging ablation of left renal pelvis tumor was confirmed.  Procedure timeout was performed.  Intravenous antibiotics were  administered.  General LMA anesthesia induced.  The patient was placed into a low lithotomy position.  Sterile field was created, prepped and draped the patient's penis, perineum and proximal thigh using iodine.  Cystourethroscopy was performed using  21-French rigid cystoscope with offset lens.  Inspection of anterior and posterior  urethra was unremarkable.  Inspection of the urinary bladder revealed some mild edema consistent with in situ stent on the left side.  Distal end of the stent was grasped  and brought to the level of the urethral meatus.  A 0.038 ZIPwire was advanced to the level of the mid ureter.  The stent was exchanged for an open-ended catheter and a left retrograde pyelogram was obtained.  Left retrograde pyelogram demonstrated single left ureter, single system left kidney.  There was some mild dilation consistent with his prior stenting.  No obvious filling defects noted.  ZIPwire was once again advanced, set aside as a safety wire.  An  8-French feeding tube placed in the urinary bladder for pressure release and semirigid ureteroscopy performed distal half of left ureter alongside a separate sensor working wire.  No mucosal abnormalities were found.  The semirigid scope was then  exchanged for an 11/13 medium length ureteral access sheath to the level of the mid ureter using continuous fluoroscopic guidance and flexible digital ureteroscopy was performed in the proximal ureter and systematic inspection of the left kidney  including all calices x3.  Amazingly, there was no residual or recurrent papillary tumors whatsoever within the left kidney, especially the upper pole area where he has had tumor on numerous prior locations.  This is very, very favorable.  There is some  very mild bullous edema in left renal pelvis consistent with prior stenting, but no papillary tissue whatsoever.  There was therefore no indication for repeat biopsy or ablation.  The access sheath was removed under continuous vision.  No significant  mucosal abnormalities were found.  A new 5 x 26 Polaris type stent placed over the remaining safety wire  using fluoroscopic guidance.  Good proximal and distal plane were noted.  Tether was left in placed and fashioned to the dorsum of the penis.   Procedure was terminated.  The patient tolerated  procedure well.  No immediate periprocedural complications.  The patient taken to postanesthesia care unit in stable condition.  Plan for discharge home.   NIK D: 12/11/2021 2:23:11 pm T: 12/11/2021 11:35:00 pm  JOB: [id number]/ [id number]

## 2021-12-11 NOTE — Transfer of Care (Signed)
Immediate Anesthesia Transfer of Care Note  Patient: Wesley [name]  Procedure(s) Performed: CYSTOSCOPY WITH RETROGRADE PYELOGRAM, URETEROSCOPY AND STENT EXCHANGE, DIAGNOSTIC URETEROSCOPY (Left)  Patient Location: PACU  Anesthesia Type:General  Level of Consciousness: drowsy  Airway & Oxygen Therapy: Patient Spontanous Breathing and Patient connected to nasal cannula oxygen  Post-op Assessment: Report given to RN and Post -op Vital signs reviewed and stable  Post vital signs: Reviewed and stable  Last Vitals:  Vitals Value Taken Time  BP 153/89 12/11/21 1424  Temp    Pulse 61 12/11/21 1427  Resp 12 12/11/21 1427  SpO2 100 % 12/11/21 1427  Vitals shown include unvalidated device data.  Last Pain:  Vitals:   12/11/21 1213  TempSrc:   PainSc: 0-No pain         Complications: No notable events documented.
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
## 2021-12-11 NOTE — Anesthesia Postprocedure Evaluation (Signed)
Anesthesia Post Note  Patient: Wesley [name]  Procedure(s) Performed: CYSTOSCOPY WITH RETROGRADE PYELOGRAM, URETEROSCOPY AND STENT EXCHANGE, DIAGNOSTIC URETEROSCOPY (Left)     Patient location during evaluation: PACU Anesthesia Type: General Level of consciousness: awake Pain management: pain level controlled Vital Signs Assessment: post-procedure vital signs reviewed and stable Respiratory status: spontaneous breathing, nonlabored ventilation, respiratory function stable and patient connected to nasal cannula oxygen Cardiovascular status: blood pressure returned to baseline and stable Postop Assessment: no apparent nausea or vomiting Anesthetic complications: no   No notable events documented.  Last Vitals:  Vitals:   12/11/21 1500 12/11/21 1512  BP: (!) 166/96 (!) 158/86  Pulse: 64 (!) 59  Resp: 13 18  Temp:    SpO2: 96% 100%    Last Pain:  Vitals:   12/11/21 1512  TempSrc:   PainSc: 0-No pain                 Tyshia Fenter P Anslie Spadafora

## 2021-12-11 NOTE — Anesthesia Procedure Notes (Signed)
Procedure Name: LMA Insertion Date/Time: 12/11/2021 1:43 PM Performed by: Claudia Desanctis, CRNA Pre-anesthesia Checklist: Patient identified, Emergency Drugs available, Suction available and Patient being monitored Patient Re-evaluated:Patient Re-evaluated prior to induction Oxygen Delivery Method: Circle system utilized Preoxygenation: Pre-oxygenation with 100% oxygen Induction Type: IV induction Ventilation: Mask ventilation without difficulty LMA: LMA inserted LMA Size: 4.0 Number of attempts: 1 Airway Equipment and Method: Stylet Placement Confirmation: ETT inserted through vocal cords under direct vision, positive ETCO2 and breath sounds checked- equal and bilateral Tube secured with: Tape Dental Injury: Teeth and Oropharynx as per pre-operative assessment

## 2021-12-11 NOTE — Discharge Instructions (Addendum)
1 - You may have urinary urgency (bladder spasms) and bloody urine on / off with stent in place. This is normal. ° °2 - Remove tethered stent on Friday morning at home by pulling on string, then blue-white plastic tubing, and discarding. Office is open Friday if any problems arise.  ° °3 - Call MD or go to ER for fever >102, severe pain / nausea / vomiting not relieved by medications, or acute change in medical status ° °

## 2021-12-11 NOTE — Anesthesia Preprocedure Evaluation (Signed)
Anesthesia Evaluation  Patient identified by MRN, date of birth, ID band Patient awake    Reviewed: Allergy & Precautions, NPO status , Patient's Chart, lab work & pertinent test results  Airway Mallampati: III  TM Distance: >3 FB Neck ROM: Full    Dental  (+) Missing   Pulmonary sleep apnea and Continuous Positive Airway Pressure Ventilation , COPD,  COPD inhaler, Current Smoker,    Pulmonary exam normal breath sounds clear to auscultation       Cardiovascular hypertension, + CAD and + Past MI  Normal cardiovascular exam+ pacemaker  Rhythm:Regular Rate:Normal  ECG: SR, rate 67   Neuro/Psych  Headaches, Seizures -,  PSYCHIATRIC DISORDERS Anxiety Depression Dementia CVA    GI/Hepatic GERD  Medicated and Controlled,(+)     substance abuse  ,   Endo/Other  diabetes, Oral Hypoglycemic Agents, Insulin Dependent  Renal/GU Renal disease     Musculoskeletal  (+) Arthritis ,   Abdominal   Peds  Hematology negative hematology ROS (+)   Anesthesia Other Findings   Reproductive/Obstetrics                             Anesthesia Physical Anesthesia Plan  ASA: 3  Anesthesia Plan: General   Post-op Pain Management:    Induction: Intravenous  PONV Risk Score and Plan: 1 and Ondansetron, Dexamethasone and Amisulpride  Airway Management Planned: LMA  Additional Equipment:   Intra-op Plan:   Post-operative Plan: Extubation in OR  Informed Consent: I have reviewed the patients History and Physical, chart, labs and discussed the procedure including the risks, benefits and alternatives for the proposed anesthesia with the patient or authorized representative who has indicated his/her understanding and acceptance.     Dental advisory given  Plan Discussed with: CRNA  Anesthesia Plan Comments:         Anesthesia Quick Evaluation

## 2021-12-11 NOTE — Brief Op Note (Signed)
12/11/2021  2:18 PM  PATIENT:  [name]  65 y.o. male  PRE-OPERATIVE DIAGNOSIS:  LEFT RENAL PELVIS CANCER  POST-OPERATIVE DIAGNOSIS:  LEFT RENAL PELVIS CANCER  PROCEDURE:  Procedure(s): CYSTOSCOPY WITH RETROGRADE PYELOGRAM, URETEROSCOPY AND STENT EXCHANGE, DIAGNOSTIC URETEROSCOPY (Left)  SURGEON:  Surgeon(s) and Role:    * Alexis Frock, MD - Primary  PHYSICIAN ASSISTANT:   ASSISTANTS: none   ANESTHESIA:   general  EBL:  0 mL   BLOOD ADMINISTERED:none  DRAINS: none   LOCAL MEDICATIONS USED:  NONE  SPECIMEN:  No Specimen  DISPOSITION OF SPECIMEN:  N/A  COUNTS:  YES  TOURNIQUET:  * No tourniquets in log *  DICTATION: .Other Dictation: Dictation Number [id number]  PLAN OF CARE: Discharge to home after PACU  PATIENT DISPOSITION:  PACU - hemodynamically stable.   Delay start of Pharmacological VTE agent (>24hrs) due to surgical blood loss or risk of bleeding: not applicable

## 2021-12-12 ENCOUNTER — Encounter (HOSPITAL_COMMUNITY): Payer: Self-pay | Admitting: Urology

## 2021-12-25 NOTE — Op Note (Signed)
Wesley [name], COLGLAZIER MEDICAL RECORD NO: [id number] ACCOUNT NO: [id number] DATE OF BIRTH: 29-Feb-1956 FACILITY: [facility] LOCATION: [facility] PHYSICIAN: Alexis Frock, MD   Operative Report    PREOPERATIVE DIAGNOSIS:  Left renal pelvis cancer, recurrent.   POSTOPERATIVE DIAGNOSIS:  History of left renal pelvis cancer.   PROCEDURE PERFORMED: 1.  Cystoscopy with left retrograde pyelogram interpretation. 2.  Left diagnostic ureteroscopy. 3.  Exchange of left ureteral stent.   ESTIMATED BLOOD LOSS:  Nil.   COMPLICATIONS:  None.   SPECIMENS:  None.   DRAINS:  None.   FINDINGS: 1.  No evidence of residual papillary left renal pelvis tumor. 2.  Successful replacement of left ureteral stent, proximal end in renal pelvis and distal end in urinary bladder.   INDICATIONS:  The patient is a pleasant 66 year old man with a longstanding history of recurrent low-grade tumor of his left renal pelvis and left upper pole.  He is status post numerous endoscopic ablative procedures for this and he is very  compliant with surveillance.  He was found recently to have significant recurrence of tumor in his left upper pole and he underwent ureteroscopic ablation of this in 10/2021.  Given the volume tumor, he presents for restaging, possibly second stage ablation  today.  Informed consent was obtained and placed in the medical record.   PROCEDURE IN DETAIL:  The patient being verified and procedure being cystoscopy, left retrograde, left ureteroscopy with possible restaging ablation of left renal pelvis tumor was confirmed.  Procedure timeout was performed.  Intravenous antibiotics were  administered.  General LMA anesthesia induced.  The patient was placed into a low lithotomy position.  Sterile field was created, prepped and draped the patient's penis, perineum and proximal thigh using iodine.  Cystourethroscopy was performed using  21-French rigid cystoscope with offset lens.  Inspection of anterior and  posterior urethra was unremarkable.  Inspection of the urinary bladder revealed some mild edema consistent with in situ stent on the left side.  Distal end of the stent was grasped  and brought to the level of the urethral meatus.  A 0.038 ZIPwire was advanced to the level of the mid ureter.  The stent was exchanged for an open-ended catheter and a left retrograde pyelogram was obtained.   Left retrograde pyelogram demonstrated single left ureter, single system left kidney.  There was some mild dilation consistent with his prior stenting.  No obvious filling defects noted.  ZIPwire was once again advanced, set aside as a safety wire.  An  8-French feeding tube placed in the urinary bladder for pressure release and semirigid ureteroscopy performed distal half of left ureter alongside a separate sensor working wire.  No mucosal abnormalities were found.  The semirigid scope was then  exchanged for an 11/13 medium length ureteral access sheath to the level of the mid ureter using continuous fluoroscopic guidance and flexible digital ureteroscopy was performed in the proximal ureter and systematic inspection of the left kidney  including all calices x3.  Amazingly, there was no residual or recurrent papillary tumors whatsoever within the left kidney, especially the upper pole area where he has had tumor on numerous prior locations.  This is very, very favorable.  There is some  very mild bullous edema in left renal pelvis consistent with prior stenting, but no papillary tissue whatsoever.  There was therefore no indication for repeat biopsy or ablation.  The access sheath was removed under continuous vision.  No significant  mucosal abnormalities were found.  A new  5 x 26 Polaris type stent placed over the remaining safety wire using fluoroscopic guidance.  Good proximal and distal plane were noted.  Tether was left in placed and fashioned to the dorsum of the penis.   Procedure was terminated.  The patient  tolerated procedure well.  No immediate periprocedural complications.  The patient taken to postanesthesia care unit in stable condition.  Plan for discharge home.

## 2022-01-29 ENCOUNTER — Other Ambulatory Visit: Payer: Self-pay

## 2022-01-29 ENCOUNTER — Encounter: Payer: Self-pay | Admitting: Pulmonary Disease

## 2022-01-29 ENCOUNTER — Ambulatory Visit (INDEPENDENT_AMBULATORY_CARE_PROVIDER_SITE_OTHER): Payer: No Typology Code available for payment source | Admitting: Pulmonary Disease

## 2022-01-29 VITALS — BP 126/68 | HR 76 | Temp 98.2°F | Ht 74.0 in | Wt 191.0 lb

## 2022-01-29 DIAGNOSIS — Z716 Tobacco abuse counseling: Secondary | ICD-10-CM

## 2022-01-29 DIAGNOSIS — F17210 Nicotine dependence, cigarettes, uncomplicated: Secondary | ICD-10-CM | POA: Diagnosis not present

## 2022-01-29 DIAGNOSIS — J449 Chronic obstructive pulmonary disease, unspecified: Secondary | ICD-10-CM

## 2022-01-29 MED ORDER — BREZTRI AEROSPHERE 160-9-4.8 MCG/ACT IN AERO: 2.0000 | INHALATION_SPRAY | Freq: Two times a day (BID) | RESPIRATORY_TRACT | 11 refills | Status: DC

## 2022-01-29 NOTE — Progress Notes (Signed)
_0  ID: Wesley [name], male    DOB: [birth year], 66 y.o.   MRN: [id number]  Chief Complaint  Patient presents with   Consult    Consult for COPD. Pt states he has had COPD for a while he is just unsure for how long    Referring provider: Lurline Hare, MD  HPI:   66 y.o. man whom we are seeing in consultation for evaluation of COPD.  Most recent pulmonary note x2 via [place] [facility] reviewed.  In the interim, he has been cared for at the [facility] for his COPD.  Patient doing well.  Baseline dyspnea stable.  Has COPD for many years.  Was seen in the clinic here for several years.  Then transferred care to the [place].  [place] pulmonologist has also retired now here establish care.  Has baseline dyspnea with stairs and inclines.  No better or worse.  He feels Stiolto is very beneficial in terms of helping his shortness of breath.  He takes Asmanex at night only.  He is not sure how much it helped.  He notes he sleeps well but not sure how much it helps his breathing as he is sleeping when this medication is in the system.  Reviewed most recent cross-sectional image in our EMR 11/20/2021 though my review and interpretation shows no PE, bullous emphysematous changes, no other abnormality.  Reviewed results of most recent CT lung cancer screening 01/17/2022 report lung RADS 2, several stable subcentimeter nodules noted.  PMH: Emphysema, asthma, GERD Surgical history: Prostate resection, cardiac cath, cystoscopy and stent placement Family history: Mother and father both with cancer, sister with lung cancer Social history: Current smoker, down to 7 cigarettes a day, trying to cut down, down significantly from 1 pack a day, lives in [place] / Pulmonary Flowsheets:   ACT:  No flowsheet data found.  MMRC: No flowsheet data found.  Epworth:  Results of the Epworth flowsheet 11/18/2016  Sitting and reading 0  Watching TV 2  Sitting, inactive in a public place (e.g. a  theatre or a meeting) 1  As a passenger in a car for an hour without a break 0  Lying down to rest in the afternoon when circumstances permit 1  Sitting and talking to someone 0  Sitting quietly after a lunch without alcohol 0  In a car, while stopped for a few minutes in traffic 0  Total score 4    Tests:   FENO:  No results found for: NITRICOXIDE  PFT: PFT Results Latest Ref Rng & Units 04/17/2016  FVC-Pre L 3.45  FVC-Predicted Pre % 74  FVC-Post L 3.79  FVC-Predicted Post % 81  Pre FEV1/FVC % % 55  Post FEV1/FCV % % 59  FEV1-Pre L 1.89  FEV1-Predicted Pre % 52  FEV1-Post L 2.24  DLCO uncorrected ml/min/mmHg 17.65  DLCO UNC% % 46  DLVA Predicted % 64  TLC L 6.73  TLC % Predicted % 86  RV % Predicted % 124  Personally reviewed interpreted as moderate fixed obstruction, significant bronchodilator response, lung volumes consistent with air trapping, DLCO severely reduced, DLCO improves corrected for alveolar volume  WALK:  SIX MIN WALK 08/26/2018  2 Minute Oxygen Saturation % 93  2 Minute HR 75  4 Minute Oxygen Saturation % 95  4 Minute HR 79  6 Minute Oxygen Saturation % 95  6 Minute HR 76    Imaging: Personally reviewed and as per EMR discussion this note  Lab Results: Personally reviewed CBC    Component Value Date/Time   WBC 7.2 11/20/2021 1137   RBC 5.54 11/20/2021 1137   HGB 15.0 11/20/2021 1137   HCT 48.6 11/20/2021 1137   HCT TEST REQUEST RECEIVED WITHOUT APPROPRIATE SPECIMEN 05/20/2017 0225   PLT 192 11/20/2021 1137   MCV 87.7 11/20/2021 1137   MCH 27.1 11/20/2021 1137   MCHC 30.9 11/20/2021 1137   RDW 14.0 11/20/2021 1137   LYMPHSABS 3.2 10/23/2021 2052   MONOABS 0.5 10/23/2021 2052   EOSABS 0.1 10/23/2021 2052   BASOSABS 0.0 10/23/2021 2052    BMET    Component Value Date/Time   NA 137 11/20/2021 1137   K 3.9 11/20/2021 1137   CL 107 11/20/2021 1137   CO2 19 (L) 11/20/2021 1137   GLUCOSE 153 (H) 11/20/2021 1137   BUN 13 11/20/2021  1137   CREATININE 0.85 11/20/2021 1137   CREATININE 0.96 03/14/2019 1046   CALCIUM 9.8 11/20/2021 1137   GFRNONAA >60 11/20/2021 1137   GFRAA >60 09/07/2020 1156    BNP    Component Value Date/Time   BNP 5.2 02/03/2018 1813    ProBNP No results found for: PROBNP  Specialty Problems       Pulmonary Problems   COPD mixed type ([place])   Obstructive sleep apnea   Pneumonia of right middle lobe due to infectious organism   CAP (community acquired pneumonia)    Allergies  Allergen Reactions   Penicillins Swelling    "General swelling" Tolerated cefepime & ceftriaxone previously. Has patient had a PCN reaction causing immediate rash, facial/tongue/throat swelling, SOB or lightheadedness with hypotension: Yes Has patient had a PCN reaction causing severe rash involving mucus membranes or skin necrosis: Unk Has patient had a PCN reaction that required hospitalization: Yes Has patient had a PCN reaction occurring within the last 10 years: No If all of the above answers are "NO", then may proceed with Cephalosporin   Percocet [Oxycodone-Acetaminophen]     Itching   Levaquin [Levofloxacin In D5w] Hives    Immunization History  Administered Date(s) Administered   Fluad Quad(high Dose 65+) 01/17/2022   Influenza Split 10/05/2016   Influenza,inj,Quad PF,6+ Mos 10/04/2015, 08/29/2016, 11/17/2017, 09/08/2019, 08/30/2020   Influenza,inj,quad, With Preservative 03/10/2019   Influenza-Unspecified 09/14/2017   Moderna Covid-19 Vaccine Bivalent Booster 36yr & up 01/17/2022   Moderna Sars-Covid-2 Vaccination 06/12/2021   PFIZER(Purple Top)SARS-COV-2 Vaccination 02/14/2020, 03/06/2020, 09/25/2020   Pneumococcal Conjugate-13 11/17/2016   Pneumococcal Polysaccharide-23 03/30/2017, 04/14/2017   Tdap 11/17/2016, 03/30/2017   Zoster Recombinat (Shingrix) 06/02/2019, 10/07/2019    Past Medical History:  Diagnosis Date   Aneurysm of infrarenal abdominal aorta    CT 11-23-2018 , 3.2cm    Anxiety    Benign localized prostatic hyperplasia with lower urinary tract symptoms (LUTS)    Cancer (HCC)    kidney cancer   [place]   CAP (community acquired pneumonia) 11/23/2018   [facility]   Chronic insomnia    COPD with emphysema ([place]    Coronary artery disease    [facility]  per cardiac cath 07-11-2015 mild plaquing of the CFx and RCA, normal LVF ([place] copy in epic)   Dementia ([facility]    "some per wife"    Depression    Diabetes mellitus without complication ([place]    type 2    Diverticulosis of colon  GERD (gastroesophageal reflux disease)    Glaucoma    Hematuria    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 2001   History of urinary retention    Hyperlipidemia    Hypertension    Incomplete emptying of bladder    Left-sided weakness 08/27/2016   CVA residual   Myocardial infarction [facility]) 2015   Neuromuscular disorder (HCC)    neuropathy feet and hands   OA (osteoarthritis)    knees, feet, wrists, back   Pacemaker    St. Jude   Paroxysmal atrial tachycardia ([place])    Polysubstance abuse ([place])    per pt from 2001 to 2016 has had couple of relapses since--  12-31-2018 per pt last relapse with cocaine approx. Oct 2019   Renal mass, left    pelvic   Retinal vein occlusion 01/2016   right eye, secondary to hypertension   S/P placement of cardiac pacemaker 12/11/2014   St Jude dual chamber (followed by [facility])   Seizures [facility])    wife reported last one  2 WEEKS AGO AS OF 07-29-2019   Sepsis ([place]) 02/2019   WENT HOME ON IV  MEDS FOR 2  1/2 WEEKS   Shortness of breath    WITH ACTIVITY   Sleep apnea    does not tolerate cpap   SSS (sick sinus syndrome) ([place])    treatment pacemaker placement   Stroke ([place]) 2017 OR 2018   had therapy  slow movement now ( 02/01/2019)  right side weaker   Symptomatic bradycardia    Syncope    Tremor, essential 03/31/2016   Tuberculosis    1990s    Wears glasses    Wears hearing aid in both ears     Tobacco History: Social History   Tobacco Use  Smoking Status Every Day   Packs/day: 0.50   Years: 40.00   Pack years: 20.00   Types: Cigarettes  Smokeless Tobacco Never  Tobacco Comments   per trying to quit with nicotine patch,  01/29/2022 is down to 7 cigs a day    Ready to quit: Not Answered Counseling given: Not Answered Tobacco comments: per trying to quit with nicotine patch,  01/29/2022 is down to 7 cigs a day    Continue to not smoke  Outpatient Encounter Medications as of 01/29/2022  Medication Sig   albuterol (PROVENTIL HFA;VENTOLIN HFA) 108 (90 Base) MCG/ACT inhaler Inhale 2 puffs into the lungs every 6 (six) hours as needed for wheezing or shortness of breath.   aspirin EC 325 MG tablet Take 325 mg by mouth daily.   atorvastatin (LIPITOR) 20 MG tablet Take 20 mg by mouth daily.   Blood Glucose Monitoring Suppl (ACCU-CHEK AVIVA PLUS) w/Device KIT 1 Device by Does not apply route 4 (four) times daily.   Brinzolamide-Brimonidine 1-0.2 % SUSP Place 1 drop into both eyes 3 (three) times daily.   Budeson-Glycopyrrol-Formoterol (BREZTRI AEROSPHERE) 160-9-4.8 MCG/ACT AERO Inhale 2 puffs into the lungs in the morning and at bedtime.   carbidopa-levodopa (SINEMET IR) 25-100 MG tablet Take 1 tablet by mouth 3 (three) times daily.   carboxymethylcellulose (REFRESH PLUS) 0.5 % SOLN  Place 1 drop into both eyes every 4 (four) hours as needed (for irritation).   cetirizine (ZYRTEC) 10 MG tablet Take 1 tablet (10 mg total) by mouth daily. (Patient taking differently: Take  10 mg by mouth daily as needed for allergies.)   Cholecalciferol (VITAMIN D3) 50 MCG (2000 UT) TABS Take 4,000 Units by mouth daily.   diclofenac sodium (VOLTAREN) 1 % GEL Apply 4 g topically 4 (four) times daily as needed (pain.).    famotidine (PEPCID) 40 MG tablet Take 40 mg by mouth at bedtime.   finasteride (PROSCAR) 5 MG tablet Take 5 mg by mouth daily.    fluticasone (FLONASE) 50 MCG/ACT nasal spray Place 1 spray into both nostrils daily.   gabapentin (NEURONTIN) 400 MG capsule Take 400 mg by mouth 3 (three) times daily.    glucose blood (ACCU-CHEK AVIVA) test strip Use as instructed   insulin aspart protamine - aspart (NOVOLOG 70/30 MIX) (70-30) 100 UNIT/ML FlexPen Inject 30 Units into the skin 2 (two) times daily with a meal.   insulin aspart protamine - aspart (NOVOLOG 70/30 MIX) (70-30) 100 UNIT/ML FlexPen Inject 30 Units into the skin 3 (three) times daily before meals.   latanoprost (XALATAN) 0.005 % ophthalmic solution Place 1 drop into both eyes at bedtime.    levETIRAcetam (KEPPRA) 500 MG tablet Take 1,000 mg by mouth 2 (two) times daily.   metFORMIN (GLUCOPHAGE) 1000 MG tablet Take 1,000 mg by mouth 2 (two) times daily.   methazolamide (NEPTAZANE) 50 MG tablet Take 50 mg by mouth daily.   mirabegron ER (MYRBETRIQ) 50 MG TB24 tablet Take 50 mg by mouth daily.   mirtazapine (REMERON) 15 MG tablet Take 15 mg by mouth at bedtime.   mometasone (ASMANEX) 220 MCG/ACT inhaler Inhale 2 puffs into the lungs daily. At night   Netarsudil Dimesylate 0.02 % SOLN Place 1 drop into both eyes daily.   nicotine (NICODERM CQ - DOSED IN MG/24 HR) 7 mg/24hr patch Place 7 mg onto the skin daily.   nicotine polacrilex (NICORETTE) 2 MG gum Take 2 mg by mouth 3 (three) times daily as needed for smoking cessation.   promethazine (PHENERGAN) 25 MG tablet Take 12.5 mg by mouth 3 (three) times daily as needed.   psyllium (METAMUCIL SMOOTH TEXTURE) 28 % packet Take 1 packet by mouth daily as needed (for  constipation).   Semaglutide (OZEMPIC, 0.25 OR 0.5 MG/DOSE, [place]) Inject 0.5 mg into the skin once a week. on Friday   senna (SENOKOT) 8.6 MG TABS tablet Take 1 tablet by mouth at bedtime.   sucralfate (CARAFATE) 1 GM/10ML suspension Take 10 mLs (1 g total) by mouth 4 (four) times daily -  with meals and at bedtime.   tamsulosin (FLOMAX) 0.4 MG CAPS capsule Take 1 capsule (0.4 mg total) by mouth daily after supper. (Patient taking differently: Take 0.4 mg by mouth daily.)   Tiotropium Bromide-Olodaterol 2.5-2.5 MCG/ACT AERS Inhale 2 puffs into the lungs daily.    traMADol (ULTRAM) 50 MG tablet Take 1-2 tablets (50-100 mg total) by mouth every 6 (six) hours as needed for moderate pain or severe pain. Post-operatively   venlafaxine XR (EFFEXOR-XR) 75 MG 24 hr capsule Take 225 mg by mouth daily with breakfast.   vitamin B-12 (CYANOCOBALAMIN) 500 MCG tablet Take 500 mcg by mouth daily.   [DISCONTINUED] sulfamethoxazole-trimethoprim (BACTRIM DS) 800-160 MG tablet Take 1 tablet by mouth daily. X 3 days to prevent infection with tethered stent.   dicyclomine (BENTYL) 20 MG tablet  Take 0.5 tablets (10 mg total) by mouth 2 (two) times daily as needed for up to 5 days for spasms.   pantoprazole (PROTONIX) 20 MG tablet Take 1 tablet (20 mg total) by mouth daily for 14 days.   No facility-administered encounter medications on file as of 01/29/2022.     Review of Systems  Review of Systems  No chest pain with exertion.  No orthopnea or PND.  Comprehensive review of systems otherwise negative.   Physical Exam  BP 126/68 (BP Location: Left Arm, Patient Position: Sitting, Cuff Size: Normal)    Pulse 76    Temp 98.2 F (36.8 C) (Oral)    Ht _0  (1.88 m)    Wt 191 lb (86.6 kg)    SpO2 98%    BMI 24.52 kg/m   Wt Readings from Last 5 Encounters:  01/29/22 191 lb (86.6 kg)  12/11/21 191 lb 6 oz (86.8 kg)  11/20/21 200 lb (90.7 kg)  10/23/21 210 lb (95.3 kg)  10/18/21 199 lb 6.4 oz (90.4 kg)    BMI  Readings from Last 5 Encounters:  01/29/22 24.52 kg/m  12/11/21 24.57 kg/m  11/20/21 25.68 kg/m  10/23/21 26.96 kg/m  10/18/21 25.60 kg/m     Physical Exam General: Sitting in chair, no acute distress Eyes: EOMI, icterus Neck: Supple, no JVP Pulmonary: Distant, no wheeze Cardiovascular: Regular rate and rhythm, no murmur Abdomen: Nondistended, bowel sounds present MSK: No synovitis, joint effusion Neuro: Normal gait, no weakness Psych: Normal mood, full affect   Assessment & Plan:   COPD: As demonstrated on prior PFTs.  [place].  Not a frequent exacerbated.  Currently on Stiolto once daily and using Asmanex only at night.  In effort to simplify medication regimen, prescribed Breztri 2 puffs twice daily.  If this is too expensive, encouraged him to use Asmanex twice a day as opposed to once a day for full triple therapy.  Tobacco abuse: Currently up-to-date with lung cancer screening via the [place], most recent scan 01/17/2022 lung RADS 2. Smoking assessment and cessation counseling Patient currently smoking: I have advised the patient to quit/stop smoking as soon as possible due to high risk for multiple medical problems.  It will also be very difficult for [place] to manage patient's  respiratory symptoms and status if we continue to expose her lungs to a known irritant.  We do not advise e-cigarettes as a form of stopping smoking.  Patient is willing to quit smoking.  I have advised the patient that we can assist and have options of nicotine replacement therapy, provided smoking cessation education today, provided smoking cessation counseling, and provided cessation resources.  We agreed on nicotine replacement and gradual reduction in cigarettes every 1 to 2 weeks.  Follow-up next office visit office visit for assessment of smoking cessation.  5 minutes spent in tobacco cessation counseling.   Return in about 6 months (around 07/29/2022).   Lanier Clam,  MD 01/29/2022

## 2022-01-29 NOTE — Patient Instructions (Signed)
Nice to meet you  I sent a prescription for Breztri 2 puffs twice a day.  Rinse your mouth out after every use.  If this is too expensive no worries, we will continue with your current medications as described below.  If you are able to get the [facility], please stop the other inhalers below.  Continue Stiolto 2 puffs once a day.  Increase Asmanex to 2 puffs twice a day.  Currently you are using once a day, in the evenings.  Rinse your mouth out after using Asmanex every time.  Continue albuterol as needed.  You can continue albuterol as needed even if you use Breztri.  Return to clinic in 6 months or sooner if needed with Dr. Silas Flood

## 2022-03-02 ENCOUNTER — Other Ambulatory Visit: Payer: Self-pay

## 2022-03-02 ENCOUNTER — Emergency Department (HOSPITAL_COMMUNITY): Payer: No Typology Code available for payment source

## 2022-03-02 ENCOUNTER — Inpatient Hospital Stay (HOSPITAL_COMMUNITY)
Admission: EM | Admit: 2022-03-02 | Discharge: 2022-03-06 | DRG: 071 | Disposition: A | Discharge status: Home Health | Payer: No Typology Code available for payment source | Attending: Internal Medicine | Admitting: Internal Medicine

## 2022-03-02 ENCOUNTER — Encounter (HOSPITAL_COMMUNITY): Payer: Self-pay

## 2022-03-02 DIAGNOSIS — Z83511 Family history of glaucoma: Secondary | ICD-10-CM

## 2022-03-02 DIAGNOSIS — G20 Parkinson's disease: Secondary | ICD-10-CM | POA: Diagnosis present

## 2022-03-02 DIAGNOSIS — N400 Enlarged prostate without lower urinary tract symptoms: Secondary | ICD-10-CM | POA: Diagnosis present

## 2022-03-02 DIAGNOSIS — I69354 Hemiplegia and hemiparesis following cerebral infarction affecting left non-dominant side: Secondary | ICD-10-CM

## 2022-03-02 DIAGNOSIS — E1165 Type 2 diabetes mellitus with hyperglycemia: Secondary | ICD-10-CM | POA: Diagnosis present

## 2022-03-02 DIAGNOSIS — I7143 Infrarenal abdominal aortic aneurysm, without rupture: Secondary | ICD-10-CM | POA: Diagnosis present

## 2022-03-02 DIAGNOSIS — E1169 Type 2 diabetes mellitus with other specified complication: Secondary | ICD-10-CM | POA: Diagnosis present

## 2022-03-02 DIAGNOSIS — F1210 Cannabis abuse, uncomplicated: Secondary | ICD-10-CM | POA: Diagnosis present

## 2022-03-02 DIAGNOSIS — Z885 Allergy status to narcotic agent status: Secondary | ICD-10-CM

## 2022-03-02 DIAGNOSIS — Z79899 Other long term (current) drug therapy: Secondary | ICD-10-CM

## 2022-03-02 DIAGNOSIS — F17210 Nicotine dependence, cigarettes, uncomplicated: Secondary | ICD-10-CM | POA: Diagnosis present

## 2022-03-02 DIAGNOSIS — E876 Hypokalemia: Secondary | ICD-10-CM | POA: Diagnosis present

## 2022-03-02 DIAGNOSIS — Z950 Presence of cardiac pacemaker: Secondary | ICD-10-CM | POA: Diagnosis present

## 2022-03-02 DIAGNOSIS — G250 Essential tremor: Secondary | ICD-10-CM | POA: Diagnosis present

## 2022-03-02 DIAGNOSIS — G40909 Epilepsy, unspecified, not intractable, without status epilepticus: Secondary | ICD-10-CM

## 2022-03-02 DIAGNOSIS — I2510 Atherosclerotic heart disease of native coronary artery without angina pectoris: Secondary | ICD-10-CM | POA: Diagnosis present

## 2022-03-02 DIAGNOSIS — Z7982 Long term (current) use of aspirin: Secondary | ICD-10-CM

## 2022-03-02 DIAGNOSIS — Z7951 Long term (current) use of inhaled steroids: Secondary | ICD-10-CM

## 2022-03-02 DIAGNOSIS — G9340 Encephalopathy, unspecified: Secondary | ICD-10-CM | POA: Diagnosis present

## 2022-03-02 DIAGNOSIS — H409 Unspecified glaucoma: Secondary | ICD-10-CM | POA: Diagnosis present

## 2022-03-02 DIAGNOSIS — Z20822 Contact with and (suspected) exposure to covid-19: Secondary | ICD-10-CM | POA: Diagnosis present

## 2022-03-02 DIAGNOSIS — Z880 Allergy status to penicillin: Secondary | ICD-10-CM

## 2022-03-02 DIAGNOSIS — I6523 Occlusion and stenosis of bilateral carotid arteries: Secondary | ICD-10-CM

## 2022-03-02 DIAGNOSIS — I252 Old myocardial infarction: Secondary | ICD-10-CM

## 2022-03-02 DIAGNOSIS — J439 Emphysema, unspecified: Secondary | ICD-10-CM | POA: Diagnosis present

## 2022-03-02 DIAGNOSIS — I495 Sick sinus syndrome: Secondary | ICD-10-CM | POA: Diagnosis present

## 2022-03-02 DIAGNOSIS — E872 Acidosis, unspecified: Secondary | ICD-10-CM | POA: Diagnosis present

## 2022-03-02 DIAGNOSIS — G9341 Metabolic encephalopathy: Secondary | ICD-10-CM | POA: Diagnosis not present

## 2022-03-02 DIAGNOSIS — E1142 Type 2 diabetes mellitus with diabetic polyneuropathy: Secondary | ICD-10-CM | POA: Diagnosis present

## 2022-03-02 DIAGNOSIS — E785 Hyperlipidemia, unspecified: Secondary | ICD-10-CM | POA: Diagnosis present

## 2022-03-02 DIAGNOSIS — Z794 Long term (current) use of insulin: Secondary | ICD-10-CM

## 2022-03-02 DIAGNOSIS — I10 Essential (primary) hypertension: Secondary | ICD-10-CM | POA: Diagnosis present

## 2022-03-02 DIAGNOSIS — Z881 Allergy status to other antibiotic agents status: Secondary | ICD-10-CM

## 2022-03-02 DIAGNOSIS — R531 Weakness: Secondary | ICD-10-CM | POA: Diagnosis not present

## 2022-03-02 DIAGNOSIS — G4733 Obstructive sleep apnea (adult) (pediatric): Secondary | ICD-10-CM | POA: Diagnosis present

## 2022-03-02 DIAGNOSIS — R404 Transient alteration of awareness: Principal | ICD-10-CM

## 2022-03-02 DIAGNOSIS — E860 Dehydration: Secondary | ICD-10-CM

## 2022-03-02 DIAGNOSIS — F32A Depression, unspecified: Secondary | ICD-10-CM | POA: Diagnosis present

## 2022-03-02 DIAGNOSIS — F1911 Other psychoactive substance abuse, in remission: Secondary | ICD-10-CM | POA: Diagnosis present

## 2022-03-02 DIAGNOSIS — I714 Abdominal aortic aneurysm, without rupture, unspecified: Secondary | ICD-10-CM

## 2022-03-02 DIAGNOSIS — K219 Gastro-esophageal reflux disease without esophagitis: Secondary | ICD-10-CM | POA: Diagnosis present

## 2022-03-02 DIAGNOSIS — E119 Type 2 diabetes mellitus without complications: Secondary | ICD-10-CM | POA: Diagnosis present

## 2022-03-02 DIAGNOSIS — Z809 Family history of malignant neoplasm, unspecified: Secondary | ICD-10-CM

## 2022-03-02 DIAGNOSIS — Z8553 Personal history of malignant neoplasm of renal pelvis: Secondary | ICD-10-CM

## 2022-03-02 DIAGNOSIS — F0280 Dementia in other diseases classified elsewhere without behavioral disturbance: Secondary | ICD-10-CM | POA: Diagnosis present

## 2022-03-02 MED ORDER — LORAZEPAM 2 MG/ML IJ SOLN
1.0000 mg | Freq: Once | INTRAMUSCULAR | Status: AC
  Administered 2022-03-02: 1 mg via INTRAVENOUS
  Filled 2022-03-02: qty 1

## 2022-03-02 MED ORDER — SODIUM CHLORIDE 0.9 % IV SOLN: INTRAVENOUS | Status: DC

## 2022-03-02 NOTE — ED Triage Notes (Addendum)
Pt BIB EMS for increased weakness that started yesterday. Pt has had difficulty standing and walking, which has gotten increasingly worse. Ems states that family also was worried about him being lethargic.  ? ?VSS w/ EMS.  ?

## 2022-03-02 NOTE — ED Provider Triage Note (Signed)
Emergency Medicine Provider Triage Evaluation Note ? ?Wesley [name] , a 66 y.o. male  was evaluated in triage.  Pt complains of diffuse weakness.  Symptoms have been present for 24 hours.  Patient is have a history of seizures.. ? ?Review of Systems  ?Positive: Vomiting or chest pain ?Negative: No fever ? ?Physical Exam  ?BP (!) 141/88   Pulse 62   Temp 98.1 ?F (36.7 ?C) (Oral)   Resp 10   Ht 1.88 m ('6\' 2"'$ )   Wt 86.6 kg   SpO2 98%   BMI 24.51 kg/m?  ?Gen:   Awake, no distress tired looking ?Resp:  Normal effort  ?MSK:   Moves extremities without difficulty  ?Other:   ? ?Medical Decision Making  ?Medically screening exam initiated at 10:47 PM.  Appropriate orders placed.  [name] was informed that the remainder of the evaluation will be completed by another provider, this initial triage assessment does not replace that evaluation, and the importance of remaining in the ED until their evaluation is complete. ? ?According to wife, patient had been complaining of having headache.  Will order head CT as well as screening blood work. ?  ?[name], MD ?03/02/22 2248 ? ?

## 2022-03-03 ENCOUNTER — Emergency Department (HOSPITAL_COMMUNITY): Payer: No Typology Code available for payment source

## 2022-03-03 ENCOUNTER — Observation Stay (HOSPITAL_COMMUNITY): Payer: No Typology Code available for payment source

## 2022-03-03 DIAGNOSIS — E872 Acidosis, unspecified: Secondary | ICD-10-CM | POA: Diagnosis present

## 2022-03-03 DIAGNOSIS — E876 Hypokalemia: Secondary | ICD-10-CM

## 2022-03-03 DIAGNOSIS — E1169 Type 2 diabetes mellitus with other specified complication: Secondary | ICD-10-CM

## 2022-03-03 DIAGNOSIS — G9340 Encephalopathy, unspecified: Secondary | ICD-10-CM | POA: Diagnosis not present

## 2022-03-03 DIAGNOSIS — G40909 Epilepsy, unspecified, not intractable, without status epilepticus: Secondary | ICD-10-CM | POA: Diagnosis not present

## 2022-03-03 DIAGNOSIS — F1911 Other psychoactive substance abuse, in remission: Secondary | ICD-10-CM

## 2022-03-03 DIAGNOSIS — Z950 Presence of cardiac pacemaker: Secondary | ICD-10-CM

## 2022-03-03 DIAGNOSIS — E669 Obesity, unspecified: Secondary | ICD-10-CM

## 2022-03-03 LAB — RAPID URINE DRUG SCREEN, HOSP PERFORMED
Amphetamines: NOT DETECTED
Barbiturates: NOT DETECTED
Benzodiazepines: NOT DETECTED
Cocaine: NOT DETECTED
Opiates: NOT DETECTED
Tetrahydrocannabinol: POSITIVE — AB

## 2022-03-03 LAB — CBG MONITORING, ED
Glucose-Capillary: 141 mg/dL — ABNORMAL HIGH (ref 70–99)
Glucose-Capillary: 136 mg/dL — ABNORMAL HIGH (ref 70–99)
Glucose-Capillary: 123 mg/dL — ABNORMAL HIGH (ref 70–99)
Glucose-Capillary: 89 mg/dL (ref 70–99)

## 2022-03-03 LAB — COMPREHENSIVE METABOLIC PANEL
ALT: 17 U/L (ref 0–44)
AST: 14 U/L — ABNORMAL LOW (ref 15–41)
Albumin: 3.1 g/dL — ABNORMAL LOW (ref 3.5–5.0)
Alkaline Phosphatase: 120 U/L (ref 38–126)
Anion gap: 11 (ref 5–15)
BUN: 10 mg/dL (ref 8–23)
CO2: 17 mmol/L — ABNORMAL LOW (ref 22–32)
Calcium: 7.7 mg/dL — ABNORMAL LOW (ref 8.9–10.3)
Chloride: 116 mmol/L — ABNORMAL HIGH (ref 98–111)
Creatinine, Ser: 0.84 mg/dL (ref 0.61–1.24)
GFR, Estimated: >60 mL/min (ref >60)
Glucose, Bld: 121 mg/dL — ABNORMAL HIGH (ref 70–99)
Potassium: 3.1 mmol/L — ABNORMAL LOW (ref 3.5–5.1)
Sodium: 144 mmol/L (ref 135–145)
Total Bilirubin: 0.4 mg/dL (ref 0.3–1.2)
Total Protein: 5.4 g/dL — ABNORMAL LOW (ref 6.5–8.1)

## 2022-03-03 LAB — CBC WITH DIFFERENTIAL/PLATELET
Abs Immature Granulocytes: 0.03 10*3/uL (ref 0.00–0.07)
Basophils Absolute: 0 10*3/uL (ref 0.0–0.1)
Basophils Relative: 1 %
Eosinophils Absolute: 0.2 10*3/uL (ref 0.0–0.5)
Eosinophils Relative: 2 %
HCT: 40.3 % (ref 39.0–52.0)
Hemoglobin: 12.5 g/dL — ABNORMAL LOW (ref 13.0–17.0)
Immature Granulocytes: 1 %
Lymphocytes Relative: 42 %
Lymphs Abs: 2.8 10*3/uL (ref 0.7–4.0)
MCH: 27.8 pg (ref 26.0–34.0)
MCHC: 31 g/dL (ref 30.0–36.0)
MCV: 89.6 fL (ref 80.0–100.0)
Monocytes Absolute: 0.3 10*3/uL (ref 0.1–1.0)
Monocytes Relative: 5 %
Neutro Abs: 3.3 10*3/uL (ref 1.7–7.7)
Neutrophils Relative %: 49 %
Platelets: 156 10*3/uL (ref 150–400)
RBC: 4.5 MIL/uL (ref 4.22–5.81)
RDW: 14.7 % (ref 11.5–15.5)
WBC: 6.5 10*3/uL (ref 4.0–10.5)
nRBC: 0 % (ref 0.0–0.2)

## 2022-03-03 LAB — URINALYSIS, ROUTINE W REFLEX MICROSCOPIC
Bacteria, UA: NONE SEEN
Bilirubin Urine: NEGATIVE
Glucose, UA: NEGATIVE mg/dL
Hgb urine dipstick: NEGATIVE
Ketones, ur: 5 mg/dL — AB
Nitrite: NEGATIVE
Protein, ur: NEGATIVE mg/dL
Specific Gravity, Urine: 1.032 — ABNORMAL HIGH (ref 1.005–1.030)
pH: 6 (ref 5.0–8.0)

## 2022-03-03 LAB — AMMONIA: Ammonia: 59 umol/L — ABNORMAL HIGH (ref 9–35)

## 2022-03-03 LAB — RESP PANEL BY RT-PCR (FLU A&B, COVID) ARPGX2
Influenza A by PCR: NEGATIVE
Influenza B by PCR: NEGATIVE
SARS Coronavirus 2 by RT PCR: NEGATIVE

## 2022-03-03 LAB — BASIC METABOLIC PANEL
Anion gap: 5 (ref 5–15)
BUN: 11 mg/dL (ref 8–23)
CO2: 23 mmol/L (ref 22–32)
Calcium: 8.7 mg/dL — ABNORMAL LOW (ref 8.9–10.3)
Chloride: 113 mmol/L — ABNORMAL HIGH (ref 98–111)
Creatinine, Ser: 0.94 mg/dL (ref 0.61–1.24)
GFR, Estimated: >60 mL/min (ref >60)
Glucose, Bld: 112 mg/dL — ABNORMAL HIGH (ref 70–99)
Potassium: 4.2 mmol/L (ref 3.5–5.1)
Sodium: 141 mmol/L (ref 135–145)

## 2022-03-03 LAB — HIV ANTIBODY (ROUTINE TESTING W REFLEX): HIV Screen 4th Generation wRfx: NONREACTIVE

## 2022-03-03 LAB — ETHANOL: Alcohol, Ethyl (B): <10 mg/dL (ref <10)

## 2022-03-03 LAB — HEMOGLOBIN A1C
Hgb A1c MFr Bld: 7.3 % — ABNORMAL HIGH (ref 4.8–5.6)
Mean Plasma Glucose: 162.81 mg/dL

## 2022-03-03 LAB — VITAMIN B12: Vitamin B-12: 594 pg/mL (ref 180–914)

## 2022-03-03 LAB — FOLATE: Folate: 8.5 ng/mL (ref >5.9)

## 2022-03-03 LAB — LACTIC ACID, PLASMA: Lactic Acid, Venous: 1.9 mmol/L (ref 0.5–1.9)

## 2022-03-03 LAB — TSH: TSH: 3.369 u[IU]/mL (ref 0.350–4.500)

## 2022-03-03 LAB — LIPASE, BLOOD: Lipase: 35 U/L (ref 11–51)

## 2022-03-03 LAB — GLUCOSE, CAPILLARY: Glucose-Capillary: 190 mg/dL — ABNORMAL HIGH (ref 70–99)

## 2022-03-03 LAB — MAGNESIUM: Magnesium: 1.5 mg/dL — ABNORMAL LOW (ref 1.7–2.4)

## 2022-03-03 MED ORDER — ORAL CARE MOUTH RINSE: 15.0000 mL | OROMUCOSAL | Status: DC

## 2022-03-03 MED ORDER — GABAPENTIN 400 MG PO CAPS
400.0000 mg | ORAL_CAPSULE | Freq: Three times a day (TID) | ORAL | Status: DC
  Administered 2022-03-03 – 2022-03-06 (×10): 400 mg via ORAL
  Filled 2022-03-03 (×10): qty 1

## 2022-03-03 MED ORDER — IOHEXOL 350 MG/ML SOLN
75.0000 mL | Freq: Once | INTRAVENOUS | Status: AC | PRN
  Administered 2022-03-03: 75 mL via INTRAVENOUS

## 2022-03-03 MED ORDER — CARBIDOPA-LEVODOPA 25-100 MG PO TABS
1.0000 | ORAL_TABLET | Freq: Three times a day (TID) | ORAL | Status: DC
  Administered 2022-03-03 – 2022-03-06 (×10): 1 via ORAL
  Filled 2022-03-03 (×10): qty 1

## 2022-03-03 MED ORDER — SEMAGLUTIDE(0.25 OR 0.5MG/DOS) 2 MG/3ML ~~LOC~~ SOPN: 0.5000 mg | PEN_INJECTOR | SUBCUTANEOUS | Status: DC

## 2022-03-03 MED ORDER — LACTATED RINGERS IV BOLUS
1000.0000 mL | Freq: Once | INTRAVENOUS | Status: AC
  Administered 2022-03-03: 1000 mL via INTRAVENOUS

## 2022-03-03 MED ORDER — LEVETIRACETAM 500 MG PO TABS
1000.0000 mg | ORAL_TABLET | Freq: Two times a day (BID) | ORAL | Status: DC
Start: 2022-03-03 — End: 2022-03-06
  Administered 2022-03-03 – 2022-03-06 (×7): 1000 mg via ORAL
  Filled 2022-03-03 (×7): qty 2

## 2022-03-03 MED ORDER — MIRTAZAPINE 15 MG PO TABS
15.0000 mg | ORAL_TABLET | Freq: Every day | ORAL | Status: DC
  Administered 2022-03-03 – 2022-03-05 (×3): 15 mg via ORAL
  Filled 2022-03-03 (×3): qty 1

## 2022-03-03 MED ORDER — SENNA 8.6 MG PO TABS
1.0000 | ORAL_TABLET | Freq: Every day | ORAL | Status: DC
  Administered 2022-03-03 – 2022-03-05 (×3): 8.6 mg via ORAL
  Filled 2022-03-03 (×3): qty 1

## 2022-03-03 MED ORDER — SODIUM BICARBONATE 8.4 % IV SOLN
Freq: Once | INTRAVENOUS | Status: AC
  Filled 2022-03-03: qty 1000

## 2022-03-03 MED ORDER — POLYVINYL ALCOHOL 1.4 % OP SOLN: 1.0000 [drp] | OPHTHALMIC | Status: DC | PRN

## 2022-03-03 MED ORDER — IOHEXOL 300 MG/ML  SOLN
100.0000 mL | Freq: Once | INTRAMUSCULAR | Status: AC | PRN
  Administered 2022-03-03: 100 mL via INTRAVENOUS

## 2022-03-03 MED ORDER — ONDANSETRON HCL 4 MG PO TABS: 4.0000 mg | ORAL_TABLET | Freq: Four times a day (QID) | ORAL | Status: DC | PRN

## 2022-03-03 MED ORDER — ACETAMINOPHEN 325 MG PO TABS
650.0000 mg | ORAL_TABLET | Freq: Four times a day (QID) | ORAL | Status: DC | PRN
  Administered 2022-03-04 – 2022-03-05 (×2): 650 mg via ORAL
  Filled 2022-03-03 (×2): qty 2

## 2022-03-03 MED ORDER — FINASTERIDE 5 MG PO TABS
5.0000 mg | ORAL_TABLET | Freq: Every day | ORAL | Status: DC
Start: 2022-03-03 — End: 2022-03-06
  Administered 2022-03-03 – 2022-03-06 (×4): 5 mg via ORAL
  Filled 2022-03-03 (×4): qty 1

## 2022-03-03 MED ORDER — CHLORHEXIDINE GLUCONATE 0.12% ORAL RINSE (MEDLINE KIT)
15.0000 mL | Freq: Two times a day (BID) | OROMUCOSAL | Status: DC
  Administered 2022-03-04 – 2022-03-06 (×5): 15 mL via OROMUCOSAL

## 2022-03-03 MED ORDER — BRINZOLAMIDE 1 % OP SUSP
1.0000 [drp] | Freq: Three times a day (TID) | OPHTHALMIC | Status: DC
  Administered 2022-03-04 – 2022-03-06 (×7): 1 [drp] via OPHTHALMIC
  Filled 2022-03-03: qty 10

## 2022-03-03 MED ORDER — ASPIRIN EC 325 MG PO TBEC
325.0000 mg | DELAYED_RELEASE_TABLET | Freq: Every day | ORAL | Status: DC
  Administered 2022-03-03 – 2022-03-06 (×4): 325 mg via ORAL
  Filled 2022-03-03 (×4): qty 1

## 2022-03-03 MED ORDER — INSULIN ASPART 100 UNIT/ML IJ SOLN
0.0000 [IU] | INTRAMUSCULAR | Status: DC
  Administered 2022-03-03: 3 [IU] via SUBCUTANEOUS
  Administered 2022-03-03 – 2022-03-04 (×3): 2 [IU] via SUBCUTANEOUS
  Administered 2022-03-04: 5 [IU] via SUBCUTANEOUS
  Administered 2022-03-04: 11 [IU] via SUBCUTANEOUS
  Administered 2022-03-04: 8 [IU] via SUBCUTANEOUS
  Administered 2022-03-05: 2 [IU] via SUBCUTANEOUS
  Administered 2022-03-05 – 2022-03-06 (×6): 3 [IU] via SUBCUTANEOUS
  Administered 2022-03-06: 5 [IU] via SUBCUTANEOUS

## 2022-03-03 MED ORDER — MIRABEGRON ER 25 MG PO TB24
50.0000 mg | ORAL_TABLET | Freq: Every day | ORAL | Status: DC
  Administered 2022-03-03 – 2022-03-06 (×4): 50 mg via ORAL
  Filled 2022-03-03: qty 1
  Filled 2022-03-03 (×3): qty 2

## 2022-03-03 MED ORDER — BUDESONIDE 0.25 MG/2ML IN SUSP
0.2500 mg | Freq: Two times a day (BID) | RESPIRATORY_TRACT | Status: DC
  Administered 2022-03-03 – 2022-03-06 (×6): 0.25 mg via RESPIRATORY_TRACT
  Filled 2022-03-03 (×7): qty 2

## 2022-03-03 MED ORDER — ATORVASTATIN CALCIUM 10 MG PO TABS
20.0000 mg | ORAL_TABLET | Freq: Every day | ORAL | Status: DC
  Administered 2022-03-03 – 2022-03-06 (×4): 20 mg via ORAL
  Filled 2022-03-03 (×4): qty 2

## 2022-03-03 MED ORDER — NETARSUDIL DIMESYLATE 0.02 % OP SOLN
1.0000 [drp] | Freq: Every day | OPHTHALMIC | Status: DC
  Administered 2022-03-04 – 2022-03-06 (×3): 1 [drp] via OPHTHALMIC
  Filled 2022-03-03 (×2): qty 2.5

## 2022-03-03 MED ORDER — BRIMONIDINE TARTRATE 0.2 % OP SOLN
1.0000 [drp] | Freq: Three times a day (TID) | OPHTHALMIC | Status: DC
  Administered 2022-03-04 – 2022-03-06 (×7): 1 [drp] via OPHTHALMIC
  Filled 2022-03-03: qty 5

## 2022-03-03 MED ORDER — UMECLIDINIUM BROMIDE 62.5 MCG/ACT IN AEPB
1.0000 | INHALATION_SPRAY | Freq: Every day | RESPIRATORY_TRACT | Status: DC
  Administered 2022-03-05 – 2022-03-06 (×2): 1 via RESPIRATORY_TRACT
  Filled 2022-03-03: qty 7

## 2022-03-03 MED ORDER — VENLAFAXINE HCL ER 75 MG PO CP24
225.0000 mg | ORAL_CAPSULE | Freq: Every day | ORAL | Status: DC
  Administered 2022-03-03 – 2022-03-06 (×4): 225 mg via ORAL
  Filled 2022-03-03: qty 1
  Filled 2022-03-03 (×3): qty 3
  Filled 2022-03-03: qty 1

## 2022-03-03 MED ORDER — ACETAMINOPHEN 650 MG RE SUPP: 650.0000 mg | Freq: Four times a day (QID) | RECTAL | Status: DC | PRN

## 2022-03-03 MED ORDER — ALBUTEROL SULFATE (2.5 MG/3ML) 0.083% IN NEBU: 2.5000 mg | INHALATION_SOLUTION | Freq: Four times a day (QID) | RESPIRATORY_TRACT | Status: DC | PRN

## 2022-03-03 MED ORDER — MIRABEGRON ER 50 MG PO TB24: 50.0000 mg | ORAL_TABLET | Freq: Every day | ORAL | Status: DC

## 2022-03-03 MED ORDER — ENOXAPARIN SODIUM 40 MG/0.4ML IJ SOSY
40.0000 mg | PREFILLED_SYRINGE | INTRAMUSCULAR | Status: DC
  Administered 2022-03-03 – 2022-03-06 (×3): 40 mg via SUBCUTANEOUS
  Filled 2022-03-03 (×4): qty 0.4

## 2022-03-03 MED ORDER — POTASSIUM CHLORIDE CRYS ER 20 MEQ PO TBCR
40.0000 meq | EXTENDED_RELEASE_TABLET | Freq: Once | ORAL | Status: AC
Start: 2022-03-03 — End: 2022-03-03
  Administered 2022-03-03: 40 meq via ORAL
  Filled 2022-03-03: qty 2

## 2022-03-03 MED ORDER — TAMSULOSIN HCL 0.4 MG PO CAPS
0.4000 mg | ORAL_CAPSULE | Freq: Every day | ORAL | Status: DC
  Administered 2022-03-03 – 2022-03-06 (×4): 0.4 mg via ORAL
  Filled 2022-03-03 (×4): qty 1

## 2022-03-03 MED ORDER — FAMOTIDINE 20 MG PO TABS
40.0000 mg | ORAL_TABLET | Freq: Every day | ORAL | Status: DC
  Administered 2022-03-03 – 2022-03-05 (×3): 40 mg via ORAL
  Filled 2022-03-03 (×3): qty 2

## 2022-03-03 MED ORDER — LATANOPROST 0.005 % OP SOLN
1.0000 [drp] | Freq: Every day | OPHTHALMIC | Status: DC
  Administered 2022-03-04 – 2022-03-05 (×2): 1 [drp] via OPHTHALMIC
  Filled 2022-03-03: qty 2.5

## 2022-03-03 MED ORDER — BUDESON-GLYCOPYRROL-FORMOTEROL 160-9-4.8 MCG/ACT IN AERO: 2.0000 | INHALATION_SPRAY | Freq: Two times a day (BID) | RESPIRATORY_TRACT | Status: DC

## 2022-03-03 MED ORDER — FLUTICASONE FUROATE-VILANTEROL 100-25 MCG/ACT IN AEPB
1.0000 | INHALATION_SPRAY | Freq: Every day | RESPIRATORY_TRACT | Status: DC
  Administered 2022-03-05 – 2022-03-06 (×2): 1 via RESPIRATORY_TRACT
  Filled 2022-03-03: qty 28

## 2022-03-03 MED ORDER — FLUTICASONE PROPIONATE 50 MCG/ACT NA SUSP
1.0000 | Freq: Every day | NASAL | Status: DC
  Administered 2022-03-05 – 2022-03-06 (×2): 1 via NASAL
  Filled 2022-03-03: qty 16

## 2022-03-03 MED ORDER — ONDANSETRON HCL 4 MG/2ML IJ SOLN: 4.0000 mg | Freq: Four times a day (QID) | INTRAMUSCULAR | Status: DC | PRN

## 2022-03-03 MED ORDER — CARBIDOPA-LEVODOPA 25-100 MG PO TABS: 1.0000 | ORAL_TABLET | Freq: Three times a day (TID) | ORAL | Status: DC

## 2022-03-03 MED ORDER — POTASSIUM CHLORIDE CRYS ER 20 MEQ PO TBCR
40.0000 meq | EXTENDED_RELEASE_TABLET | Freq: Once | ORAL | Status: AC
  Administered 2022-03-03: 40 meq via ORAL
  Filled 2022-03-03: qty 2
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
## 2022-03-03 NOTE — Progress Notes (Signed)
EEG complete - results pending 

## 2022-03-03 NOTE — ED Provider Notes (Signed)
[facility] EMERGENCY DEPARTMENT Provider Note   CSN: [id number] Arrival date & time: 03/02/22  2157     History  Chief Complaint  Patient presents with   Weakness  Level 5 caveat due to altered mental status  Wesley [name] is a 66 y.o. male.  The history is provided by the patient and the spouse. The history is limited by the condition of the patient.  Weakness Severity:  Moderate Onset quality:  Gradual Timing:  Constant Chronicity:  New Relieved by:  Nothing Worsened by:  Nothing Associated symptoms: headaches   Associated symptoms: no fever and no vomiting   Patient with extensive history including anxiety, COPD, CAD, diabetes presents with altered mental status and weakness.  History is essentially provided by the wife.  She reports for the past day he has been "tired "and missed church service. When she got home from church he still appeared tired and had decreased appetite.  He appeared confused and was staring off into space.  She reports this appears similar to previous episodes of seizure.  He is on Keppra and is med compliant.  She also reports that he appeared to have generalized weakness similar to previous episodes of sepsis.  She denies any known alcohol abuse, and has been compliant with meds and no drug withdrawals.  No fevers or vomiting, no falls. 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 2001   History of urinary retention    Hyperlipidemia    Hypertension    Incomplete emptying of bladder    Left-sided weakness 08/27/2016   CVA residual   Myocardial infarction [facility]) 2015   Neuromuscular disorder (HCC)    neuropathy feet and hands   OA (osteoarthritis)    knees, feet, wrists, back   Pacemaker    St. Jude   Paroxysmal atrial tachycardia (HCC)    Polysubstance abuse (HCC)    per pt from 2001 to 2016 has had couple of relapses since--  12-31-2018 per pt last relapse with cocaine approx. Oct 2019  Renal mass, left    pelvic   Retinal vein occlusion 01/2016   right eye, secondary to hypertension   S/P placement of cardiac pacemaker 12/11/2014   St Jude dual chamber (followed by Lenn Sink)   Seizures [facility])    wife reported last one  2 WEEKS AGO AS OF 07-29-2019   Sepsis (HCC) 02/2019   WENT HOME ON IV MEDS FOR 2  1/2 WEEKS   Shortness of breath    WITH ACTIVITY   Sleep apnea    does not tolerate cpap   SSS (sick sinus syndrome) (HCC)    treatment pacemaker placement   Stroke (HCC) 2017 OR 2018   had therapy  slow movement now ( 02/01/2019)  right side weaker   Symptomatic bradycardia    Syncope    Tremor, essential 03/31/2016   Tuberculosis    1990s    Wears glasses    Wears hearing aid in  both ears     Home Medications Prior to Admission medications   Medication Sig Start Date End Date Taking? Authorizing Provider  acetaminophen (TYLENOL) 500 MG tablet Take 1,000 mg by mouth every 6 (six) hours as needed for moderate pain or headache.   Yes [provider]  albuterol (PROVENTIL HFA;VENTOLIN HFA) 108 (90 Base) MCG/ACT inhaler Inhale 2 puffs into the lungs every 6 (six) hours as needed for wheezing or shortness of breath. 04/22/16  Yes Croitoru, Mihai, MD  aspirin EC 325 MG tablet Take 325 mg by mouth daily.   Yes [provider]  atorvastatin (LIPITOR) 20 MG tablet Take 20 mg by mouth daily.   Yes [provider]  Blood Glucose Monitoring Suppl (ACCU-CHEK AVIVA PLUS) w/Device KIT 1 Device by Does not apply route 4 (four) times daily. 09/02/16  Yes Noel, Tiffany S, PA-C  Brinzolamide-Brimonidine 1-0.2 % SUSP Place 1 drop into both eyes 3 (three) times daily.   Yes [provider]  Budeson-Glycopyrrol-Formoterol (BREZTRI AEROSPHERE) 160-9-4.8 MCG/ACT AERO Inhale 2 puffs into the lungs in the morning and at bedtime. 01/29/22  Yes Hunsucker, Lesia Sago, MD  carbidopa-levodopa (SINEMET IR) 25-100 MG tablet Take 1 tablet by mouth 3 (three) times daily. 09/24/21  Yes [provider]  carboxymethylcellulose (REFRESH PLUS) 0.5 % SOLN Place 1 drop into both eyes every 4 (four) hours as needed (for irritation).   Yes [provider]  cetirizine (ZYRTEC) 10 MG tablet Take 1 tablet (10 mg total) by mouth daily. Patient taking differently: Take 10 mg by mouth daily as needed for allergies. 05/23/17  Yes Regalado, Belkys A, MD  Cholecalciferol (VITAMIN D3) 50 MCG (2000 UT) TABS Take 4,000 Units by mouth daily.   Yes [provider]  diclofenac sodium (VOLTAREN) 1 % GEL Apply 4 g topically 4 (four) times daily as needed (pain.).    Yes [provider]  famotidine (PEPCID) 40 MG tablet Take 40 mg by mouth at bedtime.   Yes [provider]  finasteride (PROSCAR) 5 MG tablet Take 5 mg by mouth daily.    Yes [provider]  fluticasone (FLONASE) 50 MCG/ACT nasal spray Place 1 spray into both nostrils daily.   Yes [provider]  gabapentin (NEURONTIN) 400 MG capsule Take 400 mg by mouth 3 (three) times daily.    Yes [provider]  glucose blood (ACCU-CHEK AVIVA) test strip Use as instructed Patient taking differently: 4 (four) times daily -  before meals and at bedtime. 09/02/16  Yes Vivianne Master, PA-C  insulin aspart protamine - aspart (NOVOLOG 70/30 MIX) (70-30) 100 UNIT/ML FlexPen Inject 30 Units into the skin 2 (two) times daily with a meal.   Yes [provider]  latanoprost (XALATAN) 0.005 % ophthalmic solution Place 1 drop into both eyes at bedtime.    Yes [provider]  levETIRAcetam (KEPPRA) 500 MG tablet Take 1,000 mg by mouth 2 (two) times daily.   Yes [provider]  metFORMIN (GLUCOPHAGE) 1000 MG tablet Take 1,000 mg by mouth 2 (two) times daily.   Yes [provider]  methazolamide (NEPTAZANE) 50 MG tablet Take 50 mg by mouth daily.   Yes [provider]  mirabegron ER (MYRBETRIQ) 50 MG TB24 tablet Take 50 mg by mouth daily.   Yes [provider]  mirtazapine (REMERON) 15 MG tablet Take 15 mg by mouth at bedtime.   Yes [provider]  mometasone [facility]) 220 MCG/ACT inhaler Inhale 2 puffs into the lungs daily. At night   Yes [provider]  Netarsudil Dimesylate 0.02 % SOLN Place 1 drop into both eyes daily.   Yes [provider]  nicotine (NICODERM CQ - DOSED IN MG/24 HR) 7 mg/24hr patch Place 7 mg onto the skin daily.   Yes [provider]  nicotine polacrilex (NICORETTE) 2 MG gum Take 2 mg by mouth 3 (three) times daily as needed for smoking cessation.   Yes [provider]  promethazine (PHENERGAN) 25 MG tablet Take 12.5 mg by mouth 3 (three) times daily as needed for  nausea or vomiting. 12/02/21  Yes [provider]  psyllium (METAMUCIL SMOOTH TEXTURE) 28 % packet Take 1 packet by mouth daily as needed (for constipation).   Yes [provider]  Semaglutide (OZEMPIC, 0.25 OR 0.5 MG/DOSE, [place]) Inject 0.5 mg into the skin every Friday.   Yes [provider]  senna (SENOKOT) 8.6 MG TABS tablet Take 1 tablet by mouth at bedtime.   Yes [provider]  sucralfate (CARAFATE) 1 GM/10ML suspension Take 10 mLs (1 g total) by mouth 4 (four) times daily -  with meals and at bedtime. Patient taking differently: Take 1 g by mouth 4 (four) times daily as needed (stomach pain). 11/20/21  Yes Couture, Cortni S, PA-C  tamsulosin (FLOMAX) 0.4 MG CAPS capsule Take 1 capsule (0.4 mg total) by mouth daily after supper. Patient taking differently: Take 0.4 mg by mouth daily. 02/23/18  Yes Burnadette Pop, MD  traMADol (ULTRAM) 50 MG tablet Take 1-2 tablets (50-100 mg total) by mouth every 6 (six) hours as needed for moderate pain or severe pain. Post-operatively 12/11/21 12/11/22 Yes Sebastian Ache, MD  venlafaxine XR (EFFEXOR-XR) 75 MG 24 hr capsule Take 225 mg by mouth daily with breakfast.   Yes [provider]  vitamin B-12 (CYANOCOBALAMIN) 500 MCG tablet Take 500 mcg by mouth daily.   Yes [provider]  dicyclomine (BENTYL) 20 MG tablet Take 0.5 tablets (10 mg total) by mouth 2 (two) times daily as needed for up to 5 days for spasms. Patient not taking: Reported on 03/02/2022 10/24/21 12/11/21  Tanda Rockers A, DO  pantoprazole (PROTONIX) 20 MG tablet Take 1 tablet (20 mg total) by mouth daily for 14 days. Patient not taking: Reported on 03/02/2022 11/20/21 12/11/21  Couture, Cortni S, PA-C  Tiotropium Bromide-Olodaterol 2.5-2.5 MCG/ACT AERS Inhale 2 puffs into the lungs daily.     [provider]      Allergies    Penicillins, Percocet [oxycodone-acetaminophen], and Levaquin [levofloxacin  in d5w]    Review of Systems    Review of Systems  Unable to perform ROS: Mental status change  Constitutional:  Negative for fever.  Gastrointestinal:  Negative for vomiting.  Neurological:  Positive for weakness and headaches.   Physical Exam Updated Vital Signs BP 129/84   Pulse 61   Temp 97.6 F (36.4 C) (Rectal)   Resp 17   Ht 1.88 m (6\' 2" )   Wt 86.6 kg   SpO2 96%   BMI 24.51 kg/m  Physical Exam CONSTITUTIONAL: Elderly, mildly agitated HEAD: Normocephalic/atraumatic EYES: EOMI/PERRL, no nystagmus ENMT: Mucous membranes moist NECK: supple no meningeal signs SPINE/BACK:entire spine nontender CV: S1/S2 noted, no murmurs/rubs/gallops noted LUNGS: Lungs are clear to auscultation bilaterally, no apparent distress ABDOMEN: soft, diffuse moderate tenderness with guarding GU:no cva tenderness NEURO: Pt is awake but is very restless writhing around the bed said he is hearing beeping in his head.  He moves all extremities x4 but poor effort is noted EXTREMITIES: pulses normal/equal, full ROM, no deformities SKIN: warm, color normal PSYCH: Unable to assess  ED Results / Procedures / Treatments   Labs (all labs ordered are listed, but only abnormal results are displayed) Labs Reviewed  CBC WITH DIFFERENTIAL/PLATELET - Abnormal; Notable for the following components:      Result Value   Hemoglobin 12.5 (*)    All other components within normal limits  COMPREHENSIVE METABOLIC PANEL - Abnormal; Notable for the following components:   Potassium 3.1 (*)    Chloride 116 (*)    CO2 17 (*)    Glucose, Bld 121 (*)    Calcium 7.7 (*)    Total Protein 5.4 (*)    Albumin 3.1 (*)    AST 14 (*)    All other components within normal limits  URINALYSIS, ROUTINE W REFLEX MICROSCOPIC - Abnormal; Notable for the following components:   Specific Gravity, Urine 1.032 (*)    Ketones, ur 5 (*)    Leukocytes,Ua TRACE (*)    All other components within normal limits  RAPID URINE DRUG SCREEN, HOSP PERFORMED - Abnormal;  Notable for the following components:   Tetrahydrocannabinol POSITIVE (*)    All other components within normal limits  CBG MONITORING, ED - Abnormal; Notable for the following components:   Glucose-Capillary 141 (*)    All other components within normal limits  RESP PANEL BY RT-PCR (FLU A&B, COVID) ARPGX2  CULTURE, BLOOD (ROUTINE X 2)  LACTIC ACID, PLASMA  ETHANOL  LIPASE, BLOOD    EKG EKG Interpretation  Date/Time:  Sunday March 02 2022 22:07:03 EDT Ventricular Rate:  62 PR Interval:  167 QRS Duration: 82 QT Interval:  404 QTC Calculation: 411 R Axis:   73 Text Interpretation: Sinus rhythm Probable anteroseptal infarct, old Confirmed by Zadie Rhine ([id number]) on 03/02/2022 11:11:40 PM  Radiology CT Head Wo Contrast  Result Date: 03/02/2022 CLINICAL DATA:  Mental status change, unknown cause. EXAM: CT HEAD WITHOUT CONTRAST TECHNIQUE: Contiguous axial images were obtained from the base of the skull through the vertex without intravenous contrast. RADIATION DOSE REDUCTION: This exam was performed according to the departmental dose-optimization program which includes automated exposure control, adjustment of the mA and/or kV according to patient size and/or use of iterative reconstruction technique. COMPARISON:  01/24/2021. FINDINGS: Brain: No acute hemorrhage, midline shift or mass effect. No extra-axial fluid collection. Mild atrophy is noted. Subcortical and periventricular white matter hypodensities are present bilaterally. No hydrocephalus. Vascular: Atherosclerotic calcification of the carotid siphons. No hyperdense  vessel. Skull: Normal. Negative for fracture or focal lesion. Sinuses/Orbits: No acute finding. Other: None. IMPRESSION: 1. No acute intracranial process. 2. Atrophy with chronic microvascular ischemic changes. Electronically Signed   By: Thornell Sartorius M.D.   On: 03/02/2022 23:12   CT ABDOMEN PELVIS W CONTRAST  Result Date: 03/03/2022 CLINICAL DATA:  Abdominal pain,  weakness EXAM: CT ABDOMEN AND PELVIS WITH CONTRAST TECHNIQUE: Multidetector CT imaging of the abdomen and pelvis was performed using the standard protocol following bolus administration of intravenous contrast. RADIATION DOSE REDUCTION: This exam was performed according to the departmental dose-optimization program which includes automated exposure control, adjustment of the mA and/or kV according to patient size and/or use of iterative reconstruction technique. CONTRAST:  OMNIPAQUE IOHEXOL 300 MG/ML  SOLN COMPARISON:  10/23/2021 FINDINGS: Lower chest: Lung bases are clear. Hepatobiliary: Liver is within normal limits. Gallbladder is unremarkable. No intrahepatic or extrahepatic ductal dilatation. Pancreas: Within normal limits. Spleen: Within normal limits. Adrenals/Urinary Tract: Adrenal glands are within normal limits. Two right upper pole renal cysts measuring up to 12 mm (series 8/image 15). Left kidney is within normal limits. No hydronephrosis. Bladder is within normal limits. Stomach/Bowel: Stomach is within normal limits. No evidence of bowel obstruction. Normal appendix (series 3/image 86). Sigmoid diverticulosis, without evidence of diverticulitis. Vascular/Lymphatic: Ectasia of the infrarenal abdominal aorta measuring up to 3.0 cm (series 3/image 50). Atherosclerotic calcifications of the abdominal aorta and branch vessels. No suspicious abdominopelvic lymphadenopathy. Reproductive: Prostate is unremarkable. Other: No abdominopelvic ascites. Musculoskeletal: Degenerative changes of the lumbar spine. IMPRESSION: No CT findings to account for the patient's abdominal pain. 3 cm infrarenal abdominal aortic aneurysm. Recommend follow-up every 3 years. Reference: J Am Coll Radiol 2013;10:789-794. Electronically Signed   By: Charline Bills M.D.   On: 03/03/2022 02:49   DG Chest Port 1 View  Result Date: 03/02/2022 CLINICAL DATA:  Cough, weakness EXAM: PORTABLE CHEST 1 VIEW COMPARISON:  10/23/2021  FINDINGS: Bullous emphysema again noted with asymmetric bulla formation within the right apex. Discoid atelectasis within the right mid lung zone. No pneumothorax or pleural effusion. Cardiac size is within normal limits. Left subclavian dual lead pacemaker is unchanged. Pulmonary vascularity is normal. No acute bone abnormality. IMPRESSION: No radiographic evidence of acute cardiopulmonary disease. Electronically Signed   By: Helyn Numbers M.D.   On: 03/02/2022 23:02    Procedures Procedures    Medications Ordered in ED Medications  0.9 %  sodium chloride infusion ( Intravenous New Bag/Given 03/02/22 2352)  LORazepam (ATIVAN) injection 1 mg (1 mg Intravenous Given 03/02/22 2348)  lactated ringers bolus 1,000 mL (0 mLs Intravenous Stopped 03/03/22 0341)  iohexol (OMNIPAQUE) 300 MG/ML solution 100 mL (100 mLs Intravenous Contrast Given 03/03/22 0210)    ED Course/ Medical Decision Making/ A&P Clinical Course as of 03/03/22 0553  Mon Mar 03, 2022  0128 Patient with complicated history presenting with generalized weakness and altered mental status.  Initially patient appeared agitated and was writhing in the bed.  He is now improved.  CT head was negative.  He also has diffuse abdominal tenderness with guarding, CT abdomen pelvis has been ordered [DW]  0406 Patient proved, resting comfortably, no acute distress.  Awaiting urinalysis.  Discussed CT abdomen pelvis findings including need to have AAA surveillance [DW]  0518 Extensive work-up has been unrevealing, patient is more awake and alert.  However he is still feeling weak and unable to walk.  He reports that his left leg feels weak.  He denies any new back  pain.  Exact cause of his symptoms is unclear. [DW]  [phone] Initially it was felt patient may have had seizure with a prolonged postictal period however now he may have had an occult stroke.  He is out of any therapeutic window as symptoms started yesterday.  We will consult neurology and admit  [DW]    Clinical Course User Index [DW] Zadie Rhine, MD                           Medical Decision Making Amount and/or Complexity of Data Reviewed Labs: ordered. Radiology: ordered.  Risk Prescription drug management. Decision regarding hospitalization.   This patient presents to the ED for concern of altered mental status and weakness, this involves an extensive number of treatment options, and is a complaint that carries with it a high risk of complications and morbidity.  The differential diagnosis includes CVA, intracranial hemorrhage, meningitis, sepsis, electrolyte abnormality, drug ingestion, drug withdrawal  Comorbidities that complicate the patient evaluation: Patients presentation is complicated by their history of stroke  Social Determinants of Health: Patients  previous history of substance use   increases the complexity of managing their presentation  Additional history obtained: Additional history obtained from spouse Records reviewed previous admission documents  Lab Tests: I Ordered, and personally interpreted labs.  The pertinent results include: Dehydration, hypokalemia  Imaging Studies ordered: I ordered imaging studies including CT scan head and X-ray chest   I independently visualized and interpreted imaging which showed no acute finding I agree with the radiologist interpretation  Cardiac Monitoring: The patient was maintained on a cardiac monitor.  I personally viewed and interpreted the cardiac monitor which showed an underlying rhythm of:  sinus rhythm  Medicines ordered and prescription drug management: I ordered medication including IV fluids and Ativan for agitation Reevaluation of the patient after these medicines showed that the patient    improved   Consultations Obtained: I requested consultation with the admitting physician Dr. Julian Reil and consultant Dr. Derry Lory , and discussed  findings as well as pertinent plan - they  recommend: admission  Reevaluation: After the interventions noted above, I reevaluated the patient and found that they have :improved  Complexity of problems addressed: Patients presentation is most consistent with  acute presentation with potential threat to life or bodily function  Disposition: After consideration of the diagnostic results and the patients response to treatment,  I feel that the patent would benefit from admission   .         Final Clinical Impression(s) / ED Diagnoses Final diagnoses:  Transient alteration of awareness  Abdominal aortic aneurysm (AAA) without rupture, unspecified part  Dehydration    Rx / DC Orders ED Discharge Orders     None         Zadie Rhine, MD 03/03/22 [phone]

## 2022-03-03 NOTE — Assessment & Plan Note (Addendum)
Could be secondary to methazolamide that the patient was on.    Lactic acid was 1.9.  Will resume in couple of days after discharge. ?

## 2022-03-03 NOTE — ED Notes (Signed)
Admitting provider at bedside.

## 2022-03-03 NOTE — Hospital Course (Addendum)
Wesley [name] is a 66 y.o. male with past medical history of COPD, coronary disease, previous stroke with residual left-sided weakness, seizure disorder on Keppra, diabetes mellitus type 2, dementia, status post pacemaker for sick sinus syndrome presented to hospital with generalized fatigue weakness decreased appetite and some confusion.  Of note, patient is on Keppra and is compliant.  Patient was then admitted hospital with generalized weakness, acute encephalopathy.  Neurology saw the patient who recommended a CTA of the head and neck and cerebral venous angiogram.  There was some abnormality in the CT head scan and patient underwent diagnostic angiogram on 03/05/2022 with no findings of stenosis.  It was thought to be artifact.  ?

## 2022-03-03 NOTE — ED Notes (Signed)
Needed to pull a second lovenox from pyxis b/c the safety deployed when removing cap so I could not administer.  Only one dose of lovenox given at this time but 2 pulled from pyxis ?

## 2022-03-03 NOTE — Assessment & Plan Note (Addendum)
poorly controlled. On Ozempic and metformin at home.  Patient received sliding scale insulin Accu-Cheks during hospitalization. ?
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
## 2022-03-03 NOTE — Progress Notes (Signed)
?  PROGRESS NOTE ? ?Same day note ? ?JONPAUL LUMM  DQQ:[id number] DOB: [birth year] DOA: 03/02/2022 ?PCP: [facility]  ? ? ?Patient seen and examined at bedside. ? ?Patient was admitted to the hospital for altered mental status. ? ?At the time of my evaluation, patient complains of feeling okay.  Complains of generalized weakness.  Has not had a bowel movement.  Patient's wife at bedside.  Still not at his baseline. ? ?Physical examination reveals male, mildly somnolent and sluggish but oriented to place and person. ? ?Laboratory data and imaging was reviewed ? ?  ?Assessment and Plan: ?* Acute encephalopathy ?There was some concern for seizure versus stroke.  Work-up was largely negative except for non-anion gap metabolic acidosis.  Neurology was consulted.  Vitamin J94 folic acid pending.  TSH 3.3.  Ammonia mildly elevated at 59.  Magnesium low at 1.5.  Hemoglobin A1c of 7.3.  Check EEG.  Unable to do MRI due to pacemaker.  Continue Keppra, Sinemet from home.  Continue to hold methazolamide ? ?Seizure disorder ([place]) ?Possibility of breakthrough seizures.  Neurology on board.  Check EEG.  Continue Keppra for now.  Patient is also on gabapentin at home which will be continued. ? ? ?Normal anion gap metabolic acidosis ?Continue to hold methazolamide for now.  Received bicarb.  Latest CO2 at 17.  We will continue to monitor.  Lactic acid was 1.9. ? ?Diabetes mellitus type 2 in obese [facility]) ?Cont ozempic, continue to hold metformin.  Continue sliding scale insulin Accu-Cheks ? ? ?History of substance abuse ([place]) ?Urine drug screen was positive for THC. ? ?Hypokalemia ?We will continue to replenish.  Latest potassium of 3.1.  Received 40 milliequivalents of potassium.  We will repeat in the evening.  Check levels in a.m. ? ?Cardiac pacemaker in situ ?History of sick sinus syndrome. ? ? ?No charge. ? ?Flora Lipps, MD ?[facility] ?03/03/2022, 1:26 PM  ?  ?

## 2022-03-03 NOTE — Assessment & Plan Note (Addendum)
Urine drug screen was positive for THC.  Counseled against it. ?

## 2022-03-03 NOTE — Assessment & Plan Note (Addendum)
Neurology was consulted.  EEG was unremarkable.  Patient will continue Keppra and gabapentin on discharge. ? ?

## 2022-03-03 NOTE — Assessment & Plan Note (Addendum)
Acute metabolic encephalopathy.  Resolved at this time.  Could be polypharmacy, early onset dementia from Parkinson's disease.  Spoke with the patient's wife about her neurology follow-up as outpatient..  Work-up was largely negative except for non-anion gap metabolic acidosis.  during hospitalization..  Vitamin B12 594, folate of 8.5.  Folic acid at 8.5.  TSH 3.3.  Ammonia mildly elevated at 59 but the liver function test with normal.  EEG was unremarkable.  Hemoglobin A1c of 7.3. Unable to do MRI due to pacemaker.  Neurology wasconsulted with recommendations for CT angio head and neck and CT venogram.  Abnormal CT scan report noted to patient underwent diagnostic cerebral angiogram on 03/05/2022 without any stenosis and it was thought to be artifact..  Continue Keppra, Sinemet from home.  ?

## 2022-03-03 NOTE — ED Notes (Signed)
Pt transported to CT ?

## 2022-03-03 NOTE — Procedures (Signed)
Patient Name: Wesley [name]  ?MRN: [id number]  ?Epilepsy Attending: Lora Havens  ?Referring Physician/Provider: Donnetta Simpers, MD ?Date: 03/03/2022 ?Duration: 21.42 mins ? ?Patient history: 66 y.o. male with PMH significant for essential tremor, stroke back in 2017 that affected the left side, sleep apnea, seizure on keppra 500 BID presents today to [facility] ED with increased somnolence and confusion. EEG to evaluate for seizure ? ?Level of alertness: Awake, asleep ? ?AEDs during EEG study: LEV ? ?Technical aspects: This EEG study was done with scalp electrodes positioned according to the 10-20 International system of electrode placement. Electrical activity was acquired at a sampling rate of '500Hz'$  and reviewed with a high frequency filter of '70Hz'$  and a low frequency filter of '1Hz'$ . EEG data were recorded continuously and digitally stored.  ? ?Description: The posterior dominant rhythm consists of 8-9 Hz activity of moderate voltage (25-35 uV) seen predominantly in posterior head regions, symmetric and reactive to eye opening and eye closing. Sleep was characterized by vertex waves, sleep spindles (12 to 14 Hz), maximal frontocentral region. Hyperventilation and photic stimulation were not performed.    ? ?IMPRESSION: ?This study is within normal limits. No seizures or epileptiform discharges were seen throughout the recording. ? ?Lora Havens  ? ?

## 2022-03-03 NOTE — Assessment & Plan Note (Deleted)
Hold home BP meds until MRI can exclude stroke. ?

## 2022-03-03 NOTE — Assessment & Plan Note (Signed)
History of sick sinus syndrome. ?

## 2022-03-03 NOTE — Assessment & Plan Note (Addendum)
Replenished and has improved at this time.  Latest potassium at 4.0 ?

## 2022-03-03 NOTE — Discharge Instructions (Addendum)
Please follow-up with your doctors at [facility] about your abdominal aneurysm ?

## 2022-03-04 ENCOUNTER — Other Ambulatory Visit (HOSPITAL_COMMUNITY): Payer: Self-pay | Admitting: Neuroradiology

## 2022-03-04 DIAGNOSIS — R404 Transient alteration of awareness: Secondary | ICD-10-CM | POA: Diagnosis present

## 2022-03-04 DIAGNOSIS — Z20822 Contact with and (suspected) exposure to covid-19: Secondary | ICD-10-CM | POA: Diagnosis present

## 2022-03-04 DIAGNOSIS — F1210 Cannabis abuse, uncomplicated: Secondary | ICD-10-CM | POA: Diagnosis present

## 2022-03-04 DIAGNOSIS — G20 Parkinson's disease: Secondary | ICD-10-CM | POA: Diagnosis present

## 2022-03-04 DIAGNOSIS — I7143 Infrarenal abdominal aortic aneurysm, without rupture: Secondary | ICD-10-CM | POA: Diagnosis present

## 2022-03-04 DIAGNOSIS — J439 Emphysema, unspecified: Secondary | ICD-10-CM | POA: Diagnosis present

## 2022-03-04 DIAGNOSIS — E872 Acidosis, unspecified: Secondary | ICD-10-CM | POA: Diagnosis present

## 2022-03-04 DIAGNOSIS — G9341 Metabolic encephalopathy: Secondary | ICD-10-CM | POA: Diagnosis present

## 2022-03-04 DIAGNOSIS — F32A Depression, unspecified: Secondary | ICD-10-CM | POA: Diagnosis present

## 2022-03-04 DIAGNOSIS — I10 Essential (primary) hypertension: Secondary | ICD-10-CM | POA: Diagnosis present

## 2022-03-04 DIAGNOSIS — F0280 Dementia in other diseases classified elsewhere without behavioral disturbance: Secondary | ICD-10-CM | POA: Diagnosis present

## 2022-03-04 DIAGNOSIS — I495 Sick sinus syndrome: Secondary | ICD-10-CM | POA: Diagnosis present

## 2022-03-04 DIAGNOSIS — I6523 Occlusion and stenosis of bilateral carotid arteries: Secondary | ICD-10-CM

## 2022-03-04 DIAGNOSIS — I69354 Hemiplegia and hemiparesis following cerebral infarction affecting left non-dominant side: Secondary | ICD-10-CM | POA: Diagnosis not present

## 2022-03-04 DIAGNOSIS — Z950 Presence of cardiac pacemaker: Secondary | ICD-10-CM | POA: Diagnosis not present

## 2022-03-04 DIAGNOSIS — E1165 Type 2 diabetes mellitus with hyperglycemia: Secondary | ICD-10-CM | POA: Diagnosis present

## 2022-03-04 DIAGNOSIS — G40909 Epilepsy, unspecified, not intractable, without status epilepticus: Secondary | ICD-10-CM | POA: Diagnosis present

## 2022-03-04 DIAGNOSIS — F1911 Other psychoactive substance abuse, in remission: Secondary | ICD-10-CM | POA: Diagnosis not present

## 2022-03-04 DIAGNOSIS — G9340 Encephalopathy, unspecified: Secondary | ICD-10-CM | POA: Diagnosis not present

## 2022-03-04 DIAGNOSIS — Z880 Allergy status to penicillin: Secondary | ICD-10-CM | POA: Diagnosis not present

## 2022-03-04 DIAGNOSIS — E876 Hypokalemia: Secondary | ICD-10-CM | POA: Diagnosis present

## 2022-03-04 DIAGNOSIS — E1169 Type 2 diabetes mellitus with other specified complication: Secondary | ICD-10-CM | POA: Diagnosis not present

## 2022-03-04 LAB — BASIC METABOLIC PANEL
Anion gap: 7 (ref 5–15)
BUN: 13 mg/dL (ref 8–23)
CO2: 21 mmol/L — ABNORMAL LOW (ref 22–32)
Calcium: 8.7 mg/dL — ABNORMAL LOW (ref 8.9–10.3)
Chloride: 112 mmol/L — ABNORMAL HIGH (ref 98–111)
Creatinine, Ser: 0.87 mg/dL (ref 0.61–1.24)
GFR, Estimated: >60 mL/min (ref >60)
Glucose, Bld: 93 mg/dL (ref 70–99)
Potassium: 4 mmol/L (ref 3.5–5.1)
Sodium: 140 mmol/L (ref 135–145)

## 2022-03-04 LAB — MAGNESIUM: Magnesium: 1.7 mg/dL (ref 1.7–2.4)

## 2022-03-04 LAB — GLUCOSE, CAPILLARY
Glucose-Capillary: 111 mg/dL — ABNORMAL HIGH (ref 70–99)
Glucose-Capillary: 263 mg/dL — ABNORMAL HIGH (ref 70–99)
Glucose-Capillary: 150 mg/dL — ABNORMAL HIGH (ref 70–99)
Glucose-Capillary: 227 mg/dL — ABNORMAL HIGH (ref 70–99)
Glucose-Capillary: 277 mg/dL — ABNORMAL HIGH (ref 70–99)
Glucose-Capillary: 311 mg/dL — ABNORMAL HIGH (ref 70–99)
Glucose-Capillary: 209 mg/dL — ABNORMAL HIGH (ref 70–99)

## 2022-03-04 LAB — CBC
HCT: 39 % (ref 39.0–52.0)
Hemoglobin: 12.4 g/dL — ABNORMAL LOW (ref 13.0–17.0)
MCH: 27.9 pg (ref 26.0–34.0)
MCHC: 31.8 g/dL (ref 30.0–36.0)
MCV: 87.6 fL (ref 80.0–100.0)
Platelets: 158 10*3/uL (ref 150–400)
RBC: 4.45 MIL/uL (ref 4.22–5.81)
RDW: 14.7 % (ref 11.5–15.5)
WBC: 5.9 10*3/uL (ref 4.0–10.5)
nRBC: 0 % (ref 0.0–0.2)

## 2022-03-04 MED ORDER — CLOPIDOGREL BISULFATE 75 MG PO TABS
75.0000 mg | ORAL_TABLET | Freq: Every day | ORAL | Status: DC
  Administered 2022-03-04 – 2022-03-06 (×3): 75 mg via ORAL
  Filled 2022-03-04 (×3): qty 1

## 2022-03-04 MED ORDER — MAGNESIUM OXIDE -MG SUPPLEMENT 400 (240 MG) MG PO TABS
400.0000 mg | ORAL_TABLET | Freq: Two times a day (BID) | ORAL | Status: DC
  Administered 2022-03-04 – 2022-03-06 (×4): 400 mg via ORAL
  Filled 2022-03-04 (×4): qty 1

## 2022-03-04 NOTE — Progress Notes (Signed)
Brief neurology progress note. ? 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 The scan was suboptimal and contaminated with venous blood, and I believe he wold benefit from diagnostic angiogram with intention to treat. Discussed this with Dr. Ethel Rana and she agrees and plan to diagnostic angio in the  morning.  ? ?Continue ASA and clopidogrel ?NPO with meds after midnight. ?Plan for diagnostic angiobram with  Dr. Ethel Rana  in am. ? ? ?  ?Electronically signed by:  ?Lynnae Sandhoff, MD ?Page: [id number] ?03/04/2022, 6:48 PM ? ? ?

## 2022-03-04 NOTE — Progress Notes (Addendum)
PROGRESS NOTE    Wesley [name]  UJW:[id number] DOB: [birth year] DOA: 03/02/2022 PCP: [facility]    Brief Narrative:  Wesley [name] is a 66 y.o. male with past medical history of COPD, coronary disease, previous stroke with residual left-sided weakness, seizure disorder on Keppra, diabetes mellitus type 2, dementia, status post pacemaker for sick sinus syndrome presented to hospital with generalized fatigue weakness decreased appetite and some confusion.  Of note, patient is on Keppra and is compliant.  Patient was then admitted hospital with generalized weakness, acute encephalopathy.  Following conditions were addressed during hospitalization,     Assessment and Plan: * Acute encephalopathy Improved at this time.  Work-up was largely negative except for non-anion gap metabolic acidosis.  Neurology was consulted.  Vitamin B12 594, folate of 8.5.  Folic acid pending.  TSH 3.3.  Ammonia mildly elevated at 59 but the liver function test are normal.  EEG was unremarkable.  Hemoglobin A1c of 7.3. Unable to do MRI due to pacemaker.  Neurology was initially consulted with recommendations for CT angio head and neck and CT venogram.  Abnormal report noted and have notified neurology to address this.  Continue Keppra, Sinemet from home.  Continue to hold methazolamide  Seizure disorder [facility]) Neurology was consulted.  EEG was unremarkable.  Patient will continue Keppra and gabapentin on discharge.   Normal anion gap metabolic acidosis Could be secondary to methazolamide that the patient was on.    Lactic acid was 1.9.  Will resume in couple of days after discharge.  Diabetes mellitus type 2 in obese (HCC) On Ozempic and metformin at home.  Patient received sliding scale insulin Accu-Cheks during hospitalization.  History of substance abuse (HCC) Urine drug screen was positive for THC.  Counseled against it.  Hypokalemia Replenished and has improved at this time.  Potassium today  at 4.0  Cardiac pacemaker in situ History of sick sinus syndrome.  Hypomagnesemia Mild.  We will replace orally.    DVT prophylaxis: enoxaparin (LOVENOX) injection 40 mg Start: 03/03/22 1800   Code Status:     Code Status: Full Code  Disposition: Home health PT likely tomorrow if okay with neurology.  Status is: Observation  The patient will require care spanning > 2 midnights and should be moved to inpatient because: , neurology follow-up, abnormal CT head scan   Family Communication:  Spoke with patient's spouse on the phone and updated her about the clinical condition of the patient..  Consultants:  Neurology  Procedures:  EEG  Antimicrobials:  None  Anti-infectives (From admission, onward)    None      Subjective: Today, patient was seen and examined at bedside.  Patient feels a little better today.  Was able to participate with physical therapy.  Communicative  Objective: Vitals:   03/04/22 0030 03/04/22 0427 03/04/22 1247 03/04/22 1534  BP: 130/86 140/90 131/75 (!) 142/98  Pulse: 62 68 71 62  Resp: 18 18 18 18   Temp: 98.5 F (36.9 C) 98.1 F (36.7 C) 98.3 F (36.8 C) 98.3 F (36.8 C)  TempSrc: Oral Oral Oral Oral  SpO2: 96% 97% 100% 99%  Weight:      Height:        Intake/Output Summary (Last 24 hours) at 03/04/2022 1629 Last data filed at 03/03/2022 2047 Gross per 24 hour  Intake 240 ml  Output --  Net 240 ml   Filed Weights   03/02/22 2211  Weight: 86.6 kg    Physical Examination:  General:  Average built, not in obvious distress HENT:   No scleral pallor or icterus noted. Oral mucosa is moist.  Chest:  Clear breath sounds.  Diminished breath sounds bilaterally. No crackles or wheezes.  CVS: S1 &S2 heard. No murmur.  Regular rate and rhythm. Abdomen: Soft, nontender, nondistended.  Bowel sounds are heard.   Extremities: No cyanosis, clubbing or edema.  Peripheral pulses are palpable. Psych: Alert, awake and oriented, normal  mood CNS:  No cranial nerve deficits.  Power equal in all extremities.   Skin: Warm and dry.  No rashes noted.  Data Reviewed:   CBC: Recent Labs  Lab 03/03/22 0004 03/04/22 0059  WBC 6.5 5.9  NEUTROABS 3.3  --   HGB 12.5* 12.4*  HCT 40.3 39.0  MCV 89.6 87.6  PLT 156 158    Basic Metabolic Panel: Recent Labs  Lab 03/03/22 0004 03/03/22 0540 03/03/22 1300 03/04/22 0059  NA 144  --  141 140  K 3.1*  --  4.2 4.0  CL 116*  --  113* 112*  CO2 17*  --  23 21*  GLUCOSE 121*  --  112* 93  BUN 10  --  11 13  CREATININE 0.84  --  0.94 0.87  CALCIUM 7.7*  --  8.7* 8.7*  MG  --  1.5*  --  1.7    Liver Function Tests: Recent Labs  Lab 03/03/22 0004  AST 14*  ALT 17  ALKPHOS 120  BILITOT 0.4  PROT 5.4*  ALBUMIN 3.1*     Radiology Studies: CT ANGIO HEAD NECK W WO CM  Result Date: 03/03/2022 CLINICAL DATA:  Altered mental status, stroke suspected EXAM: CT ANGIOGRAPHY HEAD AND NECK CT VENOGRAM HEAD TECHNIQUE: Multidetector CT imaging of the head and neck was performed using the standard protocol during bolus administration of intravenous contrast. Multiplanar CT image reconstructions and MIPs were obtained to evaluate the vascular anatomy. Carotid stenosis measurements (when applicable) are obtained utilizing NASCET criteria, using the distal internal carotid diameter as the denominator. Venographic phase images of the brain were obtained following the administration of intravenous contrast. Multiplanar reformats and maximum intensity projections were generated. RADIATION DOSE REDUCTION: This exam was performed according to the departmental dose-optimization program which includes automated exposure control, adjustment of the mA and/or kV according to patient size and/or use of iterative reconstruction technique. CONTRAST:  75mL OMNIPAQUE IOHEXOL 350 MG/ML SOLN COMPARISON:  Noncontrast CT head dated 1 day prior, CTA head and neck 08/30/2019 FINDINGS: CT HEAD FINDINGS Brain: There  is no evidence of acute intracranial hemorrhage, extra-axial fluid collection, or acute infarct. Parenchymal volume is within normal limits. The ventricles are normal in size. There are probable small remote lacunar infarcts in the bilateral basal ganglia. There is prominence of the extra-axial CSF spaces particularly overlying the frontal lobes, similar to the prior study from 01/24/2021. There is no mass lesion.  There is no mass effect or midline shift. Vascular: See below. Skull: Normal. Negative for fracture or focal lesion. Sinuses and orbits: Paranasal sinuses are clear. Bilateral lens implants are in place. The globes and orbits are otherwise unremarkable. Other: None Review of the MIP images confirms the above findings CTA NECK FINDINGS Aortic arch: Is calcified atherosclerotic plaque of the aortic arch. The origins of the major branch vessels are patent. Subclavian arteries are patent to the level imaged. Right carotid system: The right common carotid artery is patent. There is mixed plaque in the proximal right ICA without hemodynamically significant stenosis  or occlusion. The distal right ICA is patent. The right external carotid artery is patent. There is no evidence of dissection or aneurysm. Left carotid system: The left common carotid artery is patent with minimal soft and calcified atherosclerotic plaque distally. There is mild mixed plaque in the proximal left ICA without hemodynamically significant stenosis or occlusion. The distal left ICA is patent. The left external carotid artery is patent. There is no evidence of dissection or aneurysm. Vertebral arteries: There is mild-to-moderate stenosis at the origin of the left vertebral artery, similar to the prior study from 2020. Vertebral arteries are otherwise patent, without other hemodynamically significant stenosis. There is no occlusion. There is no evidence of dissection or aneurysm. Skeleton: There is overall mild multilevel degenerative  change throughout the cervical spine. There is no acute osseous abnormality or aggressive osseous lesion. There is no visible canal hematoma. Other neck: The soft tissues are unremarkable. Upper chest: There is severe bullous change in the right apex, stable since 2020. Review of the MIP images confirms the above findings CTA HEAD FINDINGS Evaluation of the intracranial arterial vasculature is degraded by bolus timing and significant venous contamination. Anterior circulation: There is suboptimal opacification of the petrous and proximal cavernous ICAs bilaterally. There is focal high-grade stenosis or occlusion of the proximal right cavernous ICA (12-142, 14-73) new/worsened since 2020. There is reconstitution of flow at the ophthalmic segment. Similarly, on the left, there is suspected high-grade stenosis or occlusion of the proximal cavernous segment, also new/worsened since 2020 (12-138, 14-92). There is reconstitution of flow at the ophthalmic segment. The bilateral MCAs appear patent, without proximal high-grade stenosis or occlusion. There is focal severe stenosis of the left A2 segment (12-117, 118). The bilateral ACAs otherwise appear patent, without other proximal high-grade stenosis or occlusion. There is no aneurysm or AVM. Posterior circulation: The bilateral V4 segments are patent. PICA is not definitively identified. 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 The basilar artery is patent throughout. The bilateral PCAs appear patent proximally, without definite high-grade stenosis or occlusion. The distal PCAs are suboptimally evaluated due to significant venous contamination. There is no aneurysm or AVM. Venous sinuses: Patent. Anatomic variants: None. Review of the MIP images confirms the above findings CTV HEAD FINDINGS The superior sagittal sinus, internal cerebral veins, vein of Galen, straight sinus, transverse sinuses, sigmoid sinuses, and jugular bulbs are patent without evidence  of thrombus or significant stenosis. IMPRESSION: 1. No evidence of acute intracranial pathology. 2. The intracranial arterial vasculature is suboptimally assessed due to significant venous contamination. There is apparent high-grade stenosis or occlusion of the bilateral cavernous ICAs which is new/worsened since 2020, with reconstitution of flow at the opthalmic segments bilaterally. This may be in part artifactual. Recommend MRA of the head for further evaluation. 3. Focal severe stenosis of the left A2 segment. No other definite high-grade stenosis or occlusion of the intracranial vasculature. Patent basilar artery. 4. No evidence of venous sinus thrombosis. 5. Mild atherosclerotic plaque in the bilateral carotid systems without high-grade stenosis or occlusion. Aortic Atherosclerosis (ICD10-I70.0) and Emphysema (ICD10-J43.9). Electronically Signed   By: Lesia Hausen M.D.   On: 03/03/2022 08:37   CT Head Wo Contrast  Result Date: 03/02/2022 CLINICAL DATA:  Mental status change, unknown cause. EXAM: CT HEAD WITHOUT CONTRAST TECHNIQUE: Contiguous axial images were obtained from the base of the skull through the vertex without intravenous contrast. RADIATION DOSE REDUCTION: This exam was performed according to the departmental dose-optimization program which includes automated exposure control,  adjustment of the mA and/or kV according to patient size and/or use of iterative reconstruction technique. COMPARISON:  01/24/2021. FINDINGS: Brain: No acute hemorrhage, midline shift or mass effect. No extra-axial fluid collection. Mild atrophy is noted. Subcortical and periventricular white matter hypodensities are present bilaterally. No hydrocephalus. Vascular: Atherosclerotic calcification of the carotid siphons. No hyperdense vessel. Skull: Normal. Negative for fracture or focal lesion. Sinuses/Orbits: No acute finding. Other: None. IMPRESSION: 1. No acute intracranial process. 2. Atrophy with chronic microvascular  ischemic changes. Electronically Signed   By: Thornell Sartorius M.D.   On: 03/02/2022 23:12   CT ABDOMEN PELVIS W CONTRAST  Result Date: 03/03/2022 CLINICAL DATA:  Abdominal pain, weakness EXAM: CT ABDOMEN AND PELVIS WITH CONTRAST TECHNIQUE: Multidetector CT imaging of the abdomen and pelvis was performed using the standard protocol following bolus administration of intravenous contrast. RADIATION DOSE REDUCTION: This exam was performed according to the departmental dose-optimization program which includes automated exposure control, adjustment of the mA and/or kV according to patient size and/or use of iterative reconstruction technique. CONTRAST:  OMNIPAQUE IOHEXOL 300 MG/ML  SOLN COMPARISON:  10/23/2021 FINDINGS: Lower chest: Lung bases are clear. Hepatobiliary: Liver is within normal limits. Gallbladder is unremarkable. No intrahepatic or extrahepatic ductal dilatation. Pancreas: Within normal limits. Spleen: Within normal limits. Adrenals/Urinary Tract: Adrenal glands are within normal limits. Two right upper pole renal cysts measuring up to 12 mm (series 8/image 15). Left kidney is within normal limits. No hydronephrosis. Bladder is within normal limits. Stomach/Bowel: Stomach is within normal limits. No evidence of bowel obstruction. Normal appendix (series 3/image 86). Sigmoid diverticulosis, without evidence of diverticulitis. Vascular/Lymphatic: Ectasia of the infrarenal abdominal aorta measuring up to 3.0 cm (series 3/image 50). Atherosclerotic calcifications of the abdominal aorta and branch vessels. No suspicious abdominopelvic lymphadenopathy. Reproductive: Prostate is unremarkable. Other: No abdominopelvic ascites. Musculoskeletal: Degenerative changes of the lumbar spine. IMPRESSION: No CT findings to account for the patient's abdominal pain. 3 cm infrarenal abdominal aortic aneurysm. Recommend follow-up every 3 years. Reference: J Am Coll Radiol 2013;10:789-794. Electronically Signed   By:  Charline Bills M.D.   On: 03/03/2022 02:49   DG Chest Port 1 View  Result Date: 03/02/2022 CLINICAL DATA:  Cough, weakness EXAM: PORTABLE CHEST 1 VIEW COMPARISON:  10/23/2021 FINDINGS: Bullous emphysema again noted with asymmetric bulla formation within the right apex. Discoid atelectasis within the right mid lung zone. No pneumothorax or pleural effusion. Cardiac size is within normal limits. Left subclavian dual lead pacemaker is unchanged. Pulmonary vascularity is normal. No acute bone abnormality. IMPRESSION: No radiographic evidence of acute cardiopulmonary disease. Electronically Signed   By: Helyn Numbers M.D.   On: 03/02/2022 23:02   EEG adult  Result Date: 03/03/2022 Charlsie Quest, MD     03/03/2022 11:26 AM Patient Name: TYBERIUS HLADKY MRN: [id number] Epilepsy Attending: Charlsie Quest Referring Physician/Provider: Erick Blinks, MD Date: 03/03/2022 Duration: 21.42 mins Patient history: 67 y.o. male with PMH significant for essential tremor, stroke back in 2017 that affected the left side, sleep apnea, seizure on keppra 500 BID presents today to [facility] ED with increased somnolence and confusion. EEG to evaluate for seizure Level of alertness: Awake, asleep AEDs during EEG study: LEV Technical aspects: This EEG study was done with scalp electrodes positioned according to the 10-20 International system of electrode placement. Electrical activity was acquired at a sampling rate of 500Hz  and reviewed with a high frequency filter of 70Hz  and a low frequency filter of 1Hz . EEG data were  recorded continuously and digitally stored. Description: The posterior dominant rhythm consists of 8-9 Hz activity of moderate voltage (25-35 uV) seen predominantly in posterior head regions, symmetric and reactive to eye opening and eye closing. Sleep was characterized by vertex waves, sleep spindles (12 to 14 Hz), maximal frontocentral region. Hyperventilation and photic stimulation were not  performed.   IMPRESSION: This study is within normal limits. No seizures or epileptiform discharges were seen throughout the recording. Charlsie Quest   CT VENOGRAM HEAD  Result Date: 03/03/2022 CLINICAL DATA:  Altered mental status, stroke suspected EXAM: CT ANGIOGRAPHY HEAD AND NECK CT VENOGRAM HEAD TECHNIQUE: Multidetector CT imaging of the head and neck was performed using the standard protocol during bolus administration of intravenous contrast. Multiplanar CT image reconstructions and MIPs were obtained to evaluate the vascular anatomy. Carotid stenosis measurements (when applicable) are obtained utilizing NASCET criteria, using the distal internal carotid diameter as the denominator. Venographic phase images of the brain were obtained following the administration of intravenous contrast. Multiplanar reformats and maximum intensity projections were generated. RADIATION DOSE REDUCTION: This exam was performed according to the departmental dose-optimization program which includes automated exposure control, adjustment of the mA and/or kV according to patient size and/or use of iterative reconstruction technique. CONTRAST:  75mL OMNIPAQUE IOHEXOL 350 MG/ML SOLN COMPARISON:  Noncontrast CT head dated 1 day prior, CTA head and neck 08/30/2019 FINDINGS: CT HEAD FINDINGS Brain: There is no evidence of acute intracranial hemorrhage, extra-axial fluid collection, or acute infarct. Parenchymal volume is within normal limits. The ventricles are normal in size. There are probable small remote lacunar infarcts in the bilateral basal ganglia. There is prominence of the extra-axial CSF spaces particularly overlying the frontal lobes, similar to the prior study from 01/24/2021. There is no mass lesion.  There is no mass effect or midline shift. Vascular: See below. Skull: Normal. Negative for fracture or focal lesion. Sinuses and orbits: Paranasal sinuses are clear. Bilateral lens implants are in place. The globes and  orbits are otherwise unremarkable. Other: None Review of the MIP images confirms the above findings CTA NECK FINDINGS Aortic arch: Is calcified atherosclerotic plaque of the aortic arch. The origins of the major branch vessels are patent. Subclavian arteries are patent to the level imaged. Right carotid system: The right common carotid artery is patent. There is mixed plaque in the proximal right ICA without hemodynamically significant stenosis or occlusion. The distal right ICA is patent. The right external carotid artery is patent. There is no evidence of dissection or aneurysm. Left carotid system: The left common carotid artery is patent with minimal soft and calcified atherosclerotic plaque distally. There is mild mixed plaque in the proximal left ICA without hemodynamically significant stenosis or occlusion. The distal left ICA is patent. The left external carotid artery is patent. There is no evidence of dissection or aneurysm. Vertebral arteries: There is mild-to-moderate stenosis at the origin of the left vertebral artery, similar to the prior study from 2020. Vertebral arteries are otherwise patent, without other hemodynamically significant stenosis. There is no occlusion. There is no evidence of dissection or aneurysm. Skeleton: There is overall mild multilevel degenerative change throughout the cervical spine. There is no acute osseous abnormality or aggressive osseous lesion. There is no visible canal hematoma. Other neck: The soft tissues are unremarkable. Upper chest: There is severe bullous change in the right apex, stable since 2020. Review of the MIP images confirms the above findings CTA HEAD FINDINGS Evaluation of the intracranial arterial vasculature is degraded  by bolus timing and significant venous contamination. Anterior circulation: There is suboptimal opacification of the petrous and proximal cavernous ICAs bilaterally. There is focal high-grade stenosis or occlusion of the proximal right  cavernous ICA (12-142, 14-73) new/worsened since 2020. There is reconstitution of flow at the ophthalmic segment. Similarly, on the left, there is suspected high-grade stenosis or occlusion of the proximal cavernous segment, also new/worsened since 2020 (12-138, 14-92). There is reconstitution of flow at the ophthalmic segment. The bilateral MCAs appear patent, without proximal high-grade stenosis or occlusion. There is focal severe stenosis of the left A2 segment (12-117, 118). The bilateral ACAs otherwise appear patent, without other proximal high-grade stenosis or occlusion. There is no aneurysm or AVM. Posterior circulation: The bilateral V4 segments are patent. PICA is not definitively identified. 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 The basilar artery is patent throughout. The bilateral PCAs appear patent proximally, without definite high-grade stenosis or occlusion. The distal PCAs are suboptimally evaluated due to significant venous contamination. There is no aneurysm or AVM. Venous sinuses: Patent. Anatomic variants: None. Review of the MIP images confirms the above findings CTV HEAD FINDINGS The superior sagittal sinus, internal cerebral veins, vein of Galen, straight sinus, transverse sinuses, sigmoid sinuses, and jugular bulbs are patent without evidence of thrombus or significant stenosis. IMPRESSION: 1. No evidence of acute intracranial pathology. 2. The intracranial arterial vasculature is suboptimally assessed due to significant venous contamination. There is apparent high-grade stenosis or occlusion of the bilateral cavernous ICAs which is new/worsened since 2020, with reconstitution of flow at the opthalmic segments bilaterally. This may be in part artifactual. Recommend MRA of the head for further evaluation. 3. Focal severe stenosis of the left A2 segment. No other definite high-grade stenosis or occlusion of the intracranial vasculature. Patent basilar artery. 4. No evidence  of venous sinus thrombosis. 5. Mild atherosclerotic plaque in the bilateral carotid systems without high-grade stenosis or occlusion. Aortic Atherosclerosis (ICD10-I70.0) and Emphysema (ICD10-J43.9). Electronically Signed   By: Lesia Hausen M.D.   On: 03/03/2022 08:37      LOS: 0 days    Joycelyn Das, MD [facility] 03/04/2022, 4:29 PM

## 2022-03-04 NOTE — Evaluation (Signed)
Physical Therapy Evaluation ?Patient Details ?Name: Wesley [name] ?MRN: [id number] ?DOB: Jun 29, 1956 ?Today's Date: 03/04/2022 ? ?History of Present Illness ? 66 yo M presenting to [facility]/22 with progressive weakness, AMS, and trouble walking and standing. Imaging insignificant. PMH includes COPD, CAD, prior stroke with residual L sided weakness, seizure disorder on keppra, DM2, dementia, SSS s/p PPM.  ?Clinical Impression ? Patient presented to [facility] on 03/02/22 with weakness, trouble standing and walking, and AMS. Pt's impairments include decreased strength, power, balance, safety awareness, and knowledge of use of DME. These are limiting his ability to safely and independently transfer, ambulate, and navigate stairs. Patient requires min guard A with all functional mobility. Pt able to walk with improved speed and safety with RW, however, able to walk without UE with increased caution and mild unsteadiness. Pt able to climb stairs with BUE support. SPT recommending Home Health PT upon D/C due to mobility deficits. PT will continue to follow acutely to maximize pt's safety and independence with all functional mobility. ?   ?   ? ?Recommendations for follow up therapy are one component of a multi-disciplinary discharge planning process, led by the attending physician.  Recommendations may be updated based on patient status, additional functional criteria and insurance authorization. ? ?Follow Up Recommendations Home health PT ? ?  ?Assistance Recommended at Discharge Intermittent Supervision/Assistance  ?Patient can return home with the following ? A little help with walking and/or transfers;Assistance with cooking/housework;Assist for transportation;Help with stairs or ramp for entrance ? ?  ?Equipment Recommendations None recommended by PT  ?Recommendations for Other Services ?    ?  ?Functional Status Assessment Patient has had a recent decline in their functional status and demonstrates the ability to make  significant improvements in function in a reasonable and predictable amount of time.  ? ?  ?Precautions / Restrictions Precautions ?Precautions: Fall ?Precaution Comments: reports 3-4 falls in the last 4-6 weeks (reports that family unaware) ?Restrictions ?Weight Bearing Restrictions: No  ? ?  ? ?Mobility ? Bed Mobility ?Overal bed mobility: Needs Assistance ?Bed Mobility: Supine to Sit ?  ?  ?Supine to sit: Min guard ?  ?  ?General bed mobility comments: pt required min guard for trunk elevation for safety and steadying. Pt dizzy in sitting; BP 135/82. Dizziness resolved within 2 minutes of sitting ?  ? ?Transfers ?Overall transfer level: Needs assistance ?Equipment used: Rolling walker (2 wheels) ?Transfers: Sit to/from Stand ?Sit to Stand: Min guard ?  ?  ?  ?  ?  ?  ?  ? ?Ambulation/Gait ?Ambulation/Gait assistance: Min guard, +2 safety/equipment (w/ chair follow) ?Gait Distance (Feet): 200 Feet ?Assistive device: Rolling walker (2 wheels), None ?Gait Pattern/deviations: Step-through pattern, Decreased step length - right, Decreased step length - left, Decreased stride length, Knee flexed in stance - right, Knee flexed in stance - left, Narrow base of support ?Gait velocity: decreased ?Gait velocity interpretation: <1.8 ft/sec, indicate of risk for recurrent falls ?  ?General Gait Details: slow gait with knees flexed in during stance phase. Pt gait speed improved with RW and no apparent LOB noted. Pt LEs visibly shaky and pt more unsteady without RW and no AD or HHA. Pt also walked more cautiously and more slowly without an AD. Pt reported he felt more comfortable walking with RW compared to without. ? ?Stairs ?Stairs: Yes ?Stairs assistance: Min guard ?Stair Management: Step to pattern, Forwards, Two rails, With walker ?Number of Stairs: 4 ?General stair comments: pt required min guard for safety  and cues/demonstration for step to pattern ascending and descending. Pt educated on using RW with second attempt  with 2 stairs. ? ?Wheelchair Mobility ?  ? ?Modified Rankin (Stroke Patients Only) ?Modified Rankin (Stroke Patients Only) ?Pre-Morbid Rankin Score: Moderate disability ?Modified Rankin: Moderately severe disability ? ?  ? ?Balance Overall balance assessment: Needs assistance ?Sitting-balance support: Bilateral upper extremity supported, Feet supported ?Sitting balance-Leahy Scale: Fair ?Sitting balance - Comments: pt used BUE support, howver not reliant for sitting balance ?  ?Standing balance support: Bilateral upper extremity supported, No upper extremity supported, During functional activity ?Standing balance-Leahy Scale: Fair ?Standing balance comment: pt able to stand and ambulate without RW, however more unsteady and cautious without UE support. ?  ?  ?  ?  ?  ?  ?  ?  ?Standardized Balance Assessment ?Standardized Balance Assessment : Dynamic Gait Index ?  ?Dynamic Gait Index ?Level Surface: Mild Impairment ?Gait with Horizontal Head Turns: Mild Impairment ?Gait with Vertical Head Turns: Mild Impairment ?Gait and Pivot Turn: Mild Impairment ?Steps: Moderate Impairment ?   ? ? ? ?Pertinent Vitals/Pain Pain Assessment ?Pain Assessment: No/denies pain  ? ? ?Home Living Family/patient expects to be discharged to:: Private residence ?Living Arrangements: Spouse/significant other;Children ?Available Help at Discharge: Family;Available PRN/intermittently (wife and son work day shift; pt home alone during the day) ?Type of Home: House ?Home Access: Stairs to enter ?Entrance Stairs-Rails: Right;Left;Can reach both ?Entrance Stairs-Number of Steps: 3 ?  ?Home Layout: One level ?Home Equipment: Kasandra Knudsen - single point;Shower seat;Rollator (4 wheels);Grab bars - tub/shower;Electric scooter ?   ?  ?Prior Function Prior Level of Function : Independent/Modified Independent ?  ?  ?  ?  ?  ?  ?Mobility Comments: uses electric scooter outside, uses cane occasional in house, mostly wall walks, occasionally uses rollator ?ADLs  Comments: reports independence, uses shower chair ?  ? ? ?Hand Dominance  ? Dominant Hand: Right ? ?  ?Extremity/Trunk Assessment  ? Upper Extremity Assessment ?Upper Extremity Assessment: Overall WFL for tasks assessed ?  ? ?Lower Extremity Assessment ?Lower Extremity Assessment: Generalized weakness;LLE deficits/detail;RLE deficits/detail ?RLE Deficits / Details: 3+/5 MMTs hip flexion; 4+/5 MMTs all other RLE. ?RLE Sensation: WNL ?RLE Coordination: WNL ?LLE Deficits / Details: 4-/5 MMTs LLE. Decreased coordination and slow processing with heel to shin test. Decreased sensation residual from previous stroke ?LLE Sensation: decreased light touch ?LLE Coordination: decreased gross motor ?  ? ?Cervical / Trunk Assessment ?Cervical / Trunk Assessment: Normal  ?Communication  ? Communication: No difficulties  ?Cognition Arousal/Alertness: Awake/alert ?Behavior During Therapy: Flat affect ?Overall Cognitive Status: No family/caregiver present to determine baseline cognitive functioning ?  ?  ?  ?  ?  ?  ?  ?  ?  ?  ?  ?  ?  ?  ?  ?  ?General Comments: pt able was orient to self, and ansered home living and PLOF questions appropriately. Pt had delayed responses and flat affect throughout. ?  ?  ? ?  ?General Comments General comments (skin integrity, edema, etc.): VSS, however pt reports his BP drops with standing frequently at home. ? ?  ?Exercises    ? ?Assessment/Plan  ?  ?PT Assessment Patient needs continued PT services  ?PT Problem List Decreased strength;Decreased activity tolerance;Decreased balance;Decreased mobility;Decreased coordination;Decreased knowledge of use of DME;Decreased safety awareness ? ?   ?  ?PT Treatment Interventions DME instruction;Gait training;Stair training;Functional mobility training;Therapeutic activities;Therapeutic exercise;Balance training;Neuromuscular re-education;Patient/family education   ? ?PT Goals (Current goals can be  found in the Care Plan section)  ?Acute Rehab PT  Goals ?Patient Stated Goal: to go home ?PT Goal Formulation: With patient ?Time For Goal Achievement: 03/18/22 ?Potential to Achieve Goals: Good ? ?  ?Frequency Min 3X/week ?  ? ? ?Co-evaluation   ?  ?  ?  ?  ? ? ?  ?AM-PA

## 2022-03-04 NOTE — TOC Transition Note (Signed)
Transition of Care (TOC) - CM/SW Discharge Note ? ? ?Patient Details  ?Name: Wesley [name] ?MRN: [id number] ?Date of Birth: 04-19-1956 ? ?Transition of Care (TOC) CM/SW Contact:  ?Pollie Friar, RN ?Phone Number: ?03/04/2022, 2:46 PM ? ? ?Clinical Narrative:    ?Patient is from home with his spouse.  ?He goes to the [facility] and sees Dr Vonna Drafts. SW is Estella Husk: [phone] ext [phone] ?Patients wife provides needed transportation.  ?DME at home: walker/ cane/ shower seat/ elevated toilet ?Pt does his own medications in a pill box and denies any issues.  ?Orders for home health services. Pt provided choice and he says he has used [facility] in the past and would like to use them again. Corene Cornea with [facility] has accepted the referral. Information on the AVS.CM has faxed the orders to pts PcP at the [place]: [phone] ?Wife will provide transport home when discharged.  ? ?Final next level of care: [place] ?Barriers to Discharge: No Barriers Identified ? ? ?Patient Goals and CMS Choice ?  ?CMS Medicare.gov Compare Post Acute Care list provided to:: Patient ?Choice offered to / list presented to : Patient ? ?Discharge Placement ?  ?           ?  ?  ?  ?  ? ?Discharge Plan and Services ?  ?  ?           ?  ?  ?  ?  ?  ?HH Arranged: PT ?[place] Agency: [facility] ?Date HH Agency Contacted: 03/04/22 ?  ?Representative spoke with at [place]: Corene Cornea ? ?Social Determinants of Health (SDOH) Interventions ?  ? ? ?Readmission Risk Interventions ?No flowsheet data found. ? ? ? ? ?

## 2022-03-04 NOTE — Progress Notes (Signed)
Inpatient Diabetes Program Recommendations ? ?AACE/ADA: New Consensus Statement on Inpatient Glycemic Control ? ?Target Ranges:  Prepandial:   less than 140 mg/dL ?     Peak postprandial:   less than 180 mg/dL (1-2 hours) ?     Critically ill patients:  140 - 180 mg/dL  ? ? Latest Reference Range & Units 03/04/22 03:47 03/04/22 08:52  ?Glucose-Capillary 70 - 99 mg/dL 111 (H) 263 (H)  ? ? Latest Reference Range & Units 03/03/22 08:01 03/03/22 12:03 03/03/22 16:25 03/03/22 20:55  ?Glucose-Capillary 70 - 99 mg/dL 136 (H) 123 (H) 89 190 (H)  ? ?Review of Glycemic Control ? ?Diabetes history: DM2 ?Outpatient Diabetes medications: 70/30 30 units BID, Metformin 1000 mg BID, Ozempic 0.5 mg Qweek ?Current orders for Inpatient glycemic control: Novolog 0-15 units Q4H ? ?Inpatient Diabetes Program Recommendations:   ? ?Insulin: Please consider ordering Semglee 5 units Q24H. If patient is eating well, please consider changing CBGs to AC&HS and Novolog 0-15 units to AC&HS. If post prandial insulin is consistently over 180 mg/dl with addition of basal insulin, may want to consider ordering Novolog meal coverage insulin. ? ?Thanks, ?Barnie Alderman, RN, MSN, CDE ?Diabetes Coordinator ?Inpatient Diabetes Program ?[phone] (Team Pager from 8am to 5pm) ? ? ? ?

## 2022-03-04 NOTE — Care Management Obs Status (Signed)
MEDICARE OBSERVATION STATUS NOTIFICATION ? ? ?Patient Details  ?Name: Wesley [name] ?MRN: [id number] ?Date of Birth: 15-Aug-1956 ? ? ?Medicare Observation Status Notification Given:  Yes ? ? ? ?Pollie Friar, RN ?03/04/2022, 2:45 PM ?

## 2022-03-04 NOTE — Assessment & Plan Note (Addendum)
Mild.  Replenished.  Latest magnesium of 1.7 ?

## 2022-03-05 ENCOUNTER — Inpatient Hospital Stay (HOSPITAL_COMMUNITY): Payer: No Typology Code available for payment source

## 2022-03-05 ENCOUNTER — Other Ambulatory Visit (HOSPITAL_COMMUNITY): Payer: Self-pay | Admitting: Neuroradiology

## 2022-03-05 ENCOUNTER — Encounter (HOSPITAL_COMMUNITY): Payer: Self-pay | Admitting: Internal Medicine

## 2022-03-05 DIAGNOSIS — Z950 Presence of cardiac pacemaker: Secondary | ICD-10-CM | POA: Diagnosis not present

## 2022-03-05 DIAGNOSIS — E1169 Type 2 diabetes mellitus with other specified complication: Secondary | ICD-10-CM | POA: Diagnosis not present

## 2022-03-05 DIAGNOSIS — F1911 Other psychoactive substance abuse, in remission: Secondary | ICD-10-CM | POA: Diagnosis not present

## 2022-03-05 DIAGNOSIS — E876 Hypokalemia: Secondary | ICD-10-CM | POA: Diagnosis not present

## 2022-03-05 HISTORY — PX: IR ANGIO INTRA EXTRACRAN SEL INTERNAL CAROTID BILAT MOD SED: IMG5363

## 2022-03-05 HISTORY — PX: IR ANGIO VERTEBRAL SEL VERTEBRAL UNI R MOD SED: IMG5368

## 2022-03-05 HISTORY — PX: IR US GUIDE VASC ACCESS RIGHT: IMG2390

## 2022-03-05 LAB — GLUCOSE, CAPILLARY
Glucose-Capillary: 109 mg/dL — ABNORMAL HIGH (ref 70–99)
Glucose-Capillary: 141 mg/dL — ABNORMAL HIGH (ref 70–99)
Glucose-Capillary: 157 mg/dL — ABNORMAL HIGH (ref 70–99)
Glucose-Capillary: 181 mg/dL — ABNORMAL HIGH (ref 70–99)
Glucose-Capillary: 182 mg/dL — ABNORMAL HIGH (ref 70–99)
Glucose-Capillary: 196 mg/dL — ABNORMAL HIGH (ref 70–99)

## 2022-03-05 LAB — PROTIME-INR
INR: 1.1 (ref 0.8–1.2)
Prothrombin Time: 14 seconds (ref 11.4–15.2)

## 2022-03-05 MED ORDER — HEPARIN SODIUM (PORCINE) 1000 UNIT/ML IJ SOLN
INTRAMUSCULAR | Status: AC
  Filled 2022-03-05: qty 10

## 2022-03-05 MED ORDER — PSYLLIUM 95 % PO PACK
1.0000 | PACK | Freq: Every day | ORAL | Status: DC | PRN
  Administered 2022-03-06: 1 via ORAL
  Filled 2022-03-05: qty 1
  Filled 2022-03-05: qty 30
  Filled 2022-03-05: qty 1

## 2022-03-05 MED ORDER — VERAPAMIL HCL 2.5 MG/ML IV SOLN
INTRAVENOUS | Status: AC
  Filled 2022-03-05: qty 2

## 2022-03-05 MED ORDER — FENTANYL CITRATE (PF) 100 MCG/2ML IJ SOLN
INTRAMUSCULAR | Status: AC
  Filled 2022-03-05: qty 2

## 2022-03-05 MED ORDER — LIDOCAINE HCL 1 % IJ SOLN
INTRAMUSCULAR | Status: AC
  Filled 2022-03-05: qty 20

## 2022-03-05 MED ORDER — FENTANYL CITRATE (PF) 100 MCG/2ML IJ SOLN
INTRAMUSCULAR | Status: AC | PRN
  Administered 2022-03-05: 25 ug via INTRAVENOUS

## 2022-03-05 MED ORDER — IOHEXOL 300 MG/ML  SOLN
100.0000 mL | Freq: Once | INTRAMUSCULAR | Status: AC | PRN
  Administered 2022-03-05: 66 mL via INTRA_ARTERIAL

## 2022-03-05 MED ORDER — NITROGLYCERIN 1 MG/10 ML FOR IR/CATH LAB
INTRA_ARTERIAL | Status: AC
  Filled 2022-03-05: qty 10

## 2022-03-05 MED ORDER — VERAPAMIL HCL 2.5 MG/ML IV SOLN: INTRA_ARTERIAL | Status: AC | PRN

## 2022-03-05 MED ORDER — MIDAZOLAM HCL 2 MG/2ML IJ SOLN
INTRAMUSCULAR | Status: AC
  Filled 2022-03-05: qty 2

## 2022-03-05 MED ORDER — MIDAZOLAM HCL 2 MG/2ML IJ SOLN
INTRAMUSCULAR | Status: AC | PRN
  Administered 2022-03-05: 1 mg via INTRAVENOUS

## 2022-03-05 NOTE — Progress Notes (Addendum)
?PROGRESS NOTE ? ? ? ?Wesley [name]  ASN:[id number] DOB: November 24, 1956 DOA: 03/02/2022 ?PCP: [facility]  ? ? ?Brief Narrative:  ?Wesley [name] is a 66 y.o. male with past medical history of COPD, coronary disease, previous stroke with residual left-sided weakness, seizure disorder on Keppra, diabetes mellitus type 2, dementia, status post pacemaker for sick sinus syndrome presented to hospital with generalized fatigue weakness decreased appetite and some confusion.  Of note, patient is on Keppra and is compliant.  Patient was then admitted hospital with generalized weakness, acute encephalopathy.  Neurology saw the patient who recommended a CTA of the head and neck and cerebral venous angiogram.  There was some abnormality in the scan and plan is diagnostic angiogram 03/05/2022.  ? ?  ?Assessment and Plan: ?* Acute encephalopathy ?Acute metabolic encephalopathy.  Improved at this time.  Work-up was largely negative except for non-anion gap metabolic acidosis.  Neurology was consulted.  Vitamin B12 594, folate of 8.5.  Folic acid at 8.5.  TSH 3.3.  Ammonia mildly elevated at 59 but the liver function test are normal.  EEG was unremarkable.  Hemoglobin A1c of 7.3. Unable to do MRI due to pacemaker.  Neurology wasconsulted with recommendations for CT angio head and neck and CT venogram.  Abnormal CT scan report noted and neurology on board at this time.  Neurology recommending diagnostic cerebral angiogram.  Continue Keppra, Sinemet from home.  Continue to hold methazolamide ? ?Seizure disorder ([place]) ?Neurology was consulted.  EEG was unremarkable.  Patient will continue Keppra and gabapentin on discharge. ? ? ?Normal anion gap metabolic acidosis ?Could be secondary to methazolamide that the patient was on.    Lactic acid was 1.9.  Will resume in couple of days after discharge. ? ?Diabetes mellitus type 2 in obese [facility]) ?poorly controlled. On Ozempic and metformin at home.  Patient received sliding scale  insulin Accu-Cheks during hospitalization. ? ?History of substance abuse ([place]) ?Urine drug screen was positive for THC.  Counseled against it. ? ?Hypokalemia ?Replenished and has improved at this time.  Latest potassium at 4.0 ? ?Cardiac pacemaker in situ ?History of sick sinus syndrome. ? ?Hypomagnesemia ?Mild.  Replenished.  Latest magnesium of 1.7 ? ? ? DVT prophylaxis: enoxaparin (LOVENOX) injection 40 mg Start: 03/03/22 1800 ? ? ?Code Status:   ?  Code Status: Full Code ? ?Disposition: Home health PT, diagnostic cerebral angiogram today. ? ?Status is: Inpatient ? ?The patient is inpatient because: , neurology follow-up, abnormal CT head scan, need for diagnostic cerebral angiogram ? ? Family Communication:  ?Spoke with patient's spouse on the phone on 03/04/2022 ? ?Consultants:  ?Neurology ? ?Procedures:  ?EEG ? ?Antimicrobials:  ?None ? ?Anti-infectives (From admission, onward)  ? ? None  ? ?  ? ?Subjective: ?Today, patient was seen and examined at bedside.  Denies any dizziness lightheadedness confusion shortness of breath chest pain.  Feels overall better.  ? ?Objective: ?Vitals:  ? 03/05/22 1350 03/05/22 1355 03/05/22 1400 03/05/22 1405  ?BP: 136/85 (!) 149/83 135/90 135/89  ?Pulse: 64 63 68 64  ?Resp: '12 14 12 14  '$ ?Temp:      ?TempSrc:      ?SpO2: 98% 97% 98% 98%  ?Weight:      ?Height:      ? ?No intake or output data in the 24 hours ending 03/05/22 1417 ? ?Filed Weights  ? 03/02/22 2211  ?Weight: 86.6 kg  ? ? ?Physical Examination: ? ?General:  Average built, not in obvious distress ?  HENT:   No scleral pallor or icterus noted. Oral mucosa is moist.  ?Chest:  Clear breath sounds.  Diminished breath sounds bilaterally. No crackles or wheezes.  ?CVS: S1 &S2 heard. No murmur.  Regular rate and rhythm. ?Abdomen: Soft, nontender, nondistended.  Bowel sounds are heard.   ?Extremities: No cyanosis, clubbing or edema.  Peripheral pulses are palpable. ?Psych: Alert, awake and oriented, normal mood ?CNS:  No cranial  nerve deficits.  Power equal in all extremities.   ?Skin: Warm and dry.  No rashes noted. ? ?Data Reviewed:  ? ?CBC: ?Recent Labs  ?Lab 03/03/22 ?0004 03/04/22 ?2355  ?WBC 6.5 5.9  ?NEUTROABS 3.3  --   ?HGB 12.5* 12.4*  ?HCT 40.3 39.0  ?MCV 89.6 87.6  ?PLT 156 158  ? ? ?Basic Metabolic Panel: ?Recent Labs  ?Lab 03/03/22 ?0004 03/03/22 ?7322 03/03/22 ?1300 03/04/22 ?0254  ?NA 144  --  141 140  ?K 3.1*  --  4.2 4.0  ?CL 116*  --  113* 112*  ?CO2 17*  --  23 21*  ?GLUCOSE 121*  --  112* 93  ?BUN 10  --  11 13  ?CREATININE 0.84  --  0.94 0.87  ?CALCIUM 7.7*  --  8.7* 8.7*  ?MG  --  1.5*  --  1.7  ? ? ?Liver Function Tests: ?Recent Labs  ?Lab 03/03/22 ?0004  ?AST 14*  ?ALT 17  ?ALKPHOS 120  ?BILITOT 0.4  ?PROT 5.4*  ?ALBUMIN 3.1*  ? ? ? ?Radiology Studies: ?No results found. ? ? ? LOS: 1 day  ? ? ?Flora Lipps, MD ?[facility] ?03/05/2022, 2:17 PM  ? ? ?

## 2022-03-05 NOTE — Progress Notes (Signed)
Inpatient Diabetes Program Recommendations ? ?AACE/ADA: New Consensus Statement on Inpatient Glycemic Control  ? ?Target Ranges:  Prepandial:   less than 140 mg/dL ?     Peak postprandial:   less than 180 mg/dL (1-2 hours) ?     Critically ill patients:  140 - 180 mg/dL  ? ? Latest Reference Range & Units 03/04/22 03:47 03/04/22 08:52 03/04/22 12:43 03/04/22 15:32 03/04/22 20:12 03/04/22 21:11 03/04/22 23:25 03/05/22 03:23  ?Glucose-Capillary 70 - 99 mg/dL 111 (H) 263 (H) ? ?Novolog 8 units 150 (H) ? ?Novolog 2 units 227 (H) ? ?Novolog 5 units 277 (H) 311 (H) ? ?Novolog 11 units 209 (H) ? ?Novolog 3 units 196 (H) ? ?Novolog 3 units  ? ?Review of Glycemic Control ? ?Diabetes history: DM2 ?Outpatient Diabetes medications: 70/30 30 units BID, Metformin 1000 mg BID, Ozempic 0.5 mg Qweek ?Current orders for Inpatient glycemic control: Novolog 0-15 units Q4H ?  ?Inpatient Diabetes Program Recommendations:   ?  ?Insulin: Please consider ordering Semglee 8 units Q24H, change CBGs to AC&HS and Novolog 0-15 units to AC&HS, and consider ordering Novolog 3 units TID with meals for meal coverage if patient eats at least 50% of meals. ? ?Thanks, ?Barnie Alderman, RN, MSN, CDE ?Diabetes Coordinator ?Inpatient Diabetes Program ?[phone] (Team Pager from 8am to 5pm) ?  ? ?

## 2022-03-05 NOTE — Procedures (Signed)
INTERVENTIONAL NEURORADIOLOGY BRIEF POSTPROCEDURE NOTE ? ?DIAGNOSTIC CEREBRAL ANGIOGRAM  ?  ?Attending: Dr. Pedro Earls ?  ?Assistant: None.  ?  ?Diagnosis: Suspected high-grade intracranial stenosis versus occlusion of the intracranial carotid arteries.  ?  ?Access site: Distal right radial artery.  ?  ?Access closure: Inflatable band.  ?  ?Anesthesia: Moderate sedation.  ?  ?Medication used: 1 mg Versed IV; 25 mcg Fentanyl IV. ?  ?Complications: None.  ?  ?Estimated blood loss: Negligible.  ?  ?Specimen: None.  ?  ?Findings: No significant stenosis or occlusion of the major arteries of the head and neck.  Findings of CTA are likely related to artifact.  ?  ?The patient tolerated the procedure well without incident or complication and is in stable condition. ? ?  ?

## 2022-03-05 NOTE — Progress Notes (Signed)
Patient transported back to [address] room 37 after angiogram. Right compression band (clean,dry, intact). Pulses intact, no hematoma present. Site assessed with receiving RN. ?

## 2022-03-05 NOTE — H&P (Signed)
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 and/or use of iterative reconstruction technique. CONTRAST:  OMNIPAQUE IOHEXOL 300  MG/ML  SOLN COMPARISON:  10/23/2021 FINDINGS: Lower chest: Lung bases are clear. Hepatobiliary: Liver is within normal limits. Gallbladder is unremarkable. No intrahepatic or extrahepatic ductal dilatation. Pancreas: Within normal limits. Spleen: Within normal limits. Adrenals/Urinary Tract: Adrenal glands are within normal limits. Two right upper pole renal cysts measuring up to 12 mm (series 8/image 15). Left kidney is within normal limits. No hydronephrosis. Bladder is within normal limits. Stomach/Bowel: Stomach is within normal limits. No evidence of  bowel obstruction. Normal appendix (series 3/image 86). Sigmoid diverticulosis, without evidence of diverticulitis. Vascular/Lymphatic: Ectasia of the infrarenal abdominal aorta measuring up to 3.0 cm (series 3/image 50). Atherosclerotic calcifications of the abdominal aorta and branch vessels. No suspicious abdominopelvic lymphadenopathy. Reproductive: Prostate is unremarkable. Other: No abdominopelvic ascites. Musculoskeletal: Degenerative changes of the lumbar spine. IMPRESSION: No CT findings to account for the patient's abdominal pain. 3 cm infrarenal abdominal aortic aneurysm. Recommend follow-up every 3 years. Reference: J Am Coll Radiol 2013;10:789-794. Electronically Signed   By: Charline Bills M.D.   On: 03/03/2022 02:49   DG Chest Port 1 View  Result Date: 03/02/2022 CLINICAL DATA:  Cough, weakness EXAM: PORTABLE CHEST 1 VIEW COMPARISON:  10/23/2021 FINDINGS: Bullous emphysema again noted with asymmetric bulla formation within the right apex. Discoid atelectasis within the right mid lung zone. No pneumothorax or pleural effusion. Cardiac size is within normal limits. Left subclavian dual lead pacemaker is unchanged. Pulmonary vascularity is normal. No acute bone abnormality. IMPRESSION: No radiographic evidence of acute cardiopulmonary disease. Electronically Signed   By: Helyn Numbers M.D.   On: 03/02/2022 23:02   EEG adult  Result Date: 03/03/2022 Charlsie Quest, MD     03/03/2022 11:26 AM Patient Name: Wesley [name] MRN: [id number] Epilepsy Attending: Charlsie Quest Referring Physician/Provider: Erick Blinks, MD Date: 03/03/2022 Duration: 21.42 mins Patient history: 66 y.o. male with PMH significant for essential tremor, stroke back in 2017 that affected the left side, sleep apnea, seizure on keppra 500 BID presents today to [facility] ED with increased somnolence and confusion. EEG to evaluate for seizure Level of alertness: Awake, asleep AEDs during EEG study: LEV Technical  aspects: This EEG study was done with scalp electrodes positioned according to the 10-20 International system of electrode placement. Electrical activity was acquired at a sampling rate of 500Hz  and reviewed with a high frequency filter of 70Hz  and a low frequency filter of 1Hz . EEG data were recorded continuously and digitally stored. Description: The posterior dominant rhythm consists of 8-9 Hz activity of moderate voltage (25-35 uV) seen predominantly in posterior head regions, symmetric and reactive to eye opening and eye closing. Sleep was characterized by vertex waves, sleep spindles (12 to 14 Hz), maximal frontocentral region. Hyperventilation and photic stimulation were not performed.   IMPRESSION: This study is within normal limits. No seizures or epileptiform discharges were seen throughout the recording. Charlsie Quest   CT VENOGRAM HEAD  Result Date: 03/03/2022 CLINICAL DATA:  Altered mental status, stroke suspected EXAM: CT ANGIOGRAPHY HEAD AND NECK CT VENOGRAM HEAD TECHNIQUE: Multidetector CT imaging of the head and neck was performed using the standard protocol during bolus administration of intravenous contrast. Multiplanar CT image reconstructions and MIPs were obtained to evaluate the vascular anatomy. Carotid stenosis measurements (when applicable) are obtained utilizing NASCET criteria, using the distal internal carotid diameter as the denominator. Venographic phase images of the brain were obtained following the administration of intravenous contrast. Multiplanar reformats and maximum intensity projections were generated. RADIATION DOSE REDUCTION: This  exam was performed according to the departmental dose-optimization program which includes automated exposure control, adjustment of the mA and/or kV according to patient size and/or use of iterative reconstruction technique. CONTRAST:  75mL OMNIPAQUE IOHEXOL 350 MG/ML SOLN COMPARISON:  Noncontrast CT head dated 1 day prior, CTA head and neck  08/30/2019 FINDINGS: CT HEAD FINDINGS Brain: There is no evidence of acute intracranial hemorrhage, extra-axial fluid collection, or acute infarct. Parenchymal volume is within normal limits. The ventricles are normal in size. There are probable small remote lacunar infarcts in the bilateral basal ganglia. There is prominence of the extra-axial CSF spaces particularly overlying the frontal lobes, similar to the prior study from 01/24/2021. There is no mass lesion.  There is no mass effect or midline shift. Vascular: See below. Skull: Normal. Negative for fracture or focal lesion. Sinuses and orbits: Paranasal sinuses are clear. Bilateral lens implants are in place. The globes and orbits are otherwise unremarkable. Other: None Review of the MIP images confirms the above findings CTA NECK FINDINGS Aortic arch: Is calcified atherosclerotic plaque of the aortic arch. The origins of the major branch vessels are patent. Subclavian arteries are patent to the level imaged. Right carotid system: The right common carotid artery is patent. There is mixed plaque in the proximal right ICA without hemodynamically significant stenosis or occlusion. The distal right ICA is patent. The right external carotid artery is patent. There is no evidence of dissection or aneurysm. Left carotid system: The left common carotid artery is patent with minimal soft and calcified atherosclerotic plaque distally. There is mild mixed plaque in the proximal left ICA without hemodynamically significant stenosis or occlusion. The distal left ICA is patent. The left external carotid artery is patent. There is no evidence of dissection or aneurysm. Vertebral arteries: There is mild-to-moderate stenosis at the origin of the left vertebral artery, similar to the prior study from 2020. Vertebral arteries are otherwise patent, without other hemodynamically significant stenosis. There is no occlusion. There is no evidence of dissection or aneurysm. Skeleton:  There is overall mild multilevel degenerative change throughout the cervical spine. There is no acute osseous abnormality or aggressive osseous lesion. There is no visible canal hematoma. Other neck: The soft tissues are unremarkable. Upper chest: There is severe bullous change in the right apex, stable since 2020. Review of the MIP images confirms the above findings CTA HEAD FINDINGS Evaluation of the intracranial arterial vasculature is degraded by bolus timing and significant venous contamination. Anterior circulation: There is suboptimal opacification of the petrous and proximal cavernous ICAs bilaterally. There is focal high-grade stenosis or occlusion of the proximal right cavernous ICA (12-142, 14-73) new/worsened since 2020. There is reconstitution of flow at the ophthalmic segment. Similarly, on the left, there is suspected high-grade stenosis or occlusion of the proximal cavernous segment, also new/worsened since 2020 (12-138, 14-92). There is reconstitution of flow at the ophthalmic segment. The bilateral MCAs appear patent, without proximal high-grade stenosis or occlusion. There is focal severe stenosis of the left A2 segment (12-117, 118). The bilateral ACAs otherwise appear patent, without other proximal high-grade stenosis or occlusion. There is no aneurysm or AVM. Posterior circulation: The bilateral V4 segments are patent. PICA is not definitively identified. 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 The basilar artery is patent throughout. The bilateral PCAs appear patent proximally, without definite high-grade stenosis or occlusion. The distal PCAs are suboptimally evaluated due to significant venous contamination. There is no aneurysm or AVM. Venous sinuses: Patent. Anatomic variants: None.  Review of the MIP images confirms the above findings CTV HEAD FINDINGS The superior sagittal sinus, internal cerebral veins, vein of Galen, straight sinus, transverse sinuses, sigmoid sinuses,  and jugular bulbs are patent without evidence of thrombus or significant stenosis. IMPRESSION: 1. No evidence of acute intracranial pathology. 2. The intracranial arterial vasculature is suboptimally assessed due to significant venous contamination. There is apparent high-grade stenosis or occlusion of the bilateral cavernous ICAs which is new/worsened since 2020, with reconstitution of flow at the opthalmic segments bilaterally. This may be in part artifactual. Recommend MRA of the head for further evaluation. 3. Focal severe stenosis of the left A2 segment. No other definite high-grade stenosis or occlusion of the intracranial vasculature. Patent basilar artery. 4. No evidence of venous sinus thrombosis. 5. Mild atherosclerotic plaque in the bilateral carotid systems without high-grade stenosis or occlusion. Aortic Atherosclerosis (ICD10-I70.0) and Emphysema (ICD10-J43.9). Electronically Signed   By: Lesia Hausen M.D.   On: 03/03/2022 08:37    Labs:  CBC: Recent Labs    10/23/21 2052 11/20/21 1137 03/03/22 0004 03/04/22 0059  WBC 8.8 7.2 6.5 5.9  HGB 15.0 15.0 12.5* 12.4*  HCT 46.2 48.6 40.3 39.0  PLT 211 192 156 158    COAGS: No results for input(s): INR, APTT in the last 8760 hours.  BMP: Recent Labs    11/20/21 1137 03/03/22 0004 03/03/22 1300 03/04/22 0059  NA 137 144 141 140  K 3.9 3.1* 4.2 4.0  CL 107 116* 113* 112*  CO2 19* 17* 23 21*  GLUCOSE 153* 121* 112* 93  BUN 13 10 11 13   CALCIUM 9.8 7.7* 8.7* 8.7*  CREATININE 0.85 0.84 0.94 0.87  GFRNONAA >60 >60 >60 >60    LIVER FUNCTION TESTS: Recent Labs    10/23/21 2052 11/20/21 1137 03/03/22 0004  BILITOT 0.5 0.8 0.4  AST 16 16 14*  ALT 30 20 17   ALKPHOS 165* 160* 120  PROT 8.1 8.2* 5.4*  ALBUMIN 4.5 4.6 3.1*    TUMOR MARKERS: No results for input(s): AFPTM, CEA, CA199, CHROMGRNA in the last 8760 hours.  Assessment and Plan: History of infrarenal abdominal aortic aneurysm, anxiety, BHP w/ LUTS, cancer of  left renal pelvis, CAP, COPD w/ emphysema, CAD, GERD, DM II, CVA w/ left sided weakness,  HLD, HTN, MI, pace maker, polysubstance abuse, dyspnea, OSA, seizures and SSS. Pt was admitted after presenting to ED c/o weakness and confusion. CTA found severe intra/extra cranial stenoses and possible occlusion of the right cavernous ICA that has progresses since previous study in 2020. Pt was referred to Dr. Joana Reamer for diagnostic angiogram.   CT Angio head/neck 03/03/22 IMPRESSION: 1. No evidence of acute intracranial pathology. 2. The intracranial arterial vasculature is suboptimally assessed due to significant venous contamination. There is apparent high-grade stenosis or occlusion of the bilateral cavernous ICAs which is new/worsened since 2020, with reconstitution of flow at the opthalmic segments bilaterally. This may be in part artifactual. Recommend MRA of the head for further evaluation. 3. Focal severe stenosis of the left A2 segment. No other definite high-grade stenosis or occlusion of the intracranial vasculature. Patent basilar artery. 4. No evidence of venous sinus thrombosis. 5. Mild atherosclerotic plaque in the bilateral carotid systems without high-grade stenosis or occlusion.  Pt sitting upright in bed. He is A&O, calm and pleasant. He is in no distress. Pt wife at bedside.  Pt is NPO per order.  He states he takes ASA daily.   Risks and benefits of cerebral angiogram  with intervention were discussed with the patient including, but not limited to bleeding, infection, vascular injury, contrast induced renal failure, stroke or even death.  This interventional procedure involves the use of X-rays and because of the nature of the planned procedure, it is possible that we will have prolonged use of X-ray fluoroscopy.  Potential radiation risks to you include (but are not limited to) the following: - A slightly elevated risk for cancer  several years later in life. This risk is  typically less than 0.5% percent. This risk is low in comparison to the normal incidence of human cancer, which is 33% for women and 50% for men according to the [employer] Society. - Radiation induced injury can include skin redness, resembling a rash, tissue breakdown / ulcers and hair loss (which can be temporary or permanent).   The likelihood of either of these occurring depends on the difficulty of the procedure and whether you are sensitive to radiation due to previous procedures, disease, or genetic conditions.   IF your procedure requires a prolonged use of radiation, you will be notified and given written instructions for further action.  It is your responsibility to monitor the irradiated area for the 2 weeks following the procedure and to notify your physician if you are concerned that you have suffered a radiation induced injury.    All of the patient's questions were answered, patient is agreeable to proceed.  Consent signed and in chart.    Thank you for this interesting consult.  I greatly enjoyed meeting KODEE RAVERT and look forward to participating in their care.  A copy of this report was sent to the requesting provider on this date.  Electronically Signed: Shon Hough, NP 03/05/2022, 8:49 AM   I spent a total of 20 minutes in face to face in clinical consultation, greater than 50% of which was counseling/coordinating care for  diagnostic cerebral angiogram with moderate sedation.

## 2022-03-05 NOTE — Sedation Documentation (Signed)
Right radial sheath removed, TR band applied at 1407 with 7cc of air. ?

## 2022-03-06 DIAGNOSIS — Z950 Presence of cardiac pacemaker: Secondary | ICD-10-CM | POA: Diagnosis not present

## 2022-03-06 DIAGNOSIS — G9341 Metabolic encephalopathy: Secondary | ICD-10-CM | POA: Diagnosis not present

## 2022-03-06 DIAGNOSIS — G9340 Encephalopathy, unspecified: Secondary | ICD-10-CM | POA: Diagnosis not present

## 2022-03-06 DIAGNOSIS — E1169 Type 2 diabetes mellitus with other specified complication: Secondary | ICD-10-CM | POA: Diagnosis not present

## 2022-03-06 LAB — GLUCOSE, CAPILLARY
Glucose-Capillary: 220 mg/dL — ABNORMAL HIGH (ref 70–99)
Glucose-Capillary: 172 mg/dL — ABNORMAL HIGH (ref 70–99)

## 2022-03-06 LAB — METHYLMALONIC ACID, SERUM: Methylmalonic Acid, Quantitative: 134 nmol/L (ref 0–378)

## 2022-03-06 MED ORDER — FLEET ENEMA 7-19 GM/118ML RE ENEM
1.0000 | ENEMA | Freq: Once | RECTAL | Status: AC
  Administered 2022-03-06: 1 via RECTAL
  Filled 2022-03-06: qty 1

## 2022-03-06 MED ORDER — METHAZOLAMIDE 50 MG PO TABS: 50.0000 mg | ORAL_TABLET | Freq: Every day | ORAL | Status: DC

## 2022-03-06 NOTE — Progress Notes (Signed)
Physical Therapy Treatment ?Patient Details ?Name: Wesley [name] ?MRN: [id number] ?DOB: 22-Nov-1956 ?Today's Date: 03/06/2022 ? ? ?History of Present Illness 66 yo M presenting to [facility] 03/02/22 with progressive weakness, AMS, and trouble walking and standing. Imaging insignificant. PMH includes COPD, CAD, prior stroke with residual L sided weakness, seizure disorder on keppra, DM2, dementia, SSS s/p PPM. ? ?  ?PT Comments  ? ? Pt was able to ambulate around the floor without taking a break with supervision assist. He was able to perform higher level balance activities during ambulation without loss of balance. He did report fatigue at the end of walking indicating that he has room to improve with endurance. Pt perform 7 sit to stands from low bed position and demonstrated safe controlled movements during concentric and eccentric portions of the movement. Pt was carefull and methodical about movements and environment able to navigate busy hallways without cuing. During treatment session patient showed deficits in endurance and activity tolerance. Recommending therapy services with home health physical therapy to address the previously stated deficits. Will continue to follow acutely to maximize functional mobility, independence and safety. ?  ?Recommendations for follow up therapy are one component of a multi-disciplinary discharge planning process, led by the attending physician.  Recommendations may be updated based on patient status, additional functional criteria and insurance authorization. ? ?Follow Up Recommendations ? Home health PT ?  ?  ?Assistance Recommended at Discharge PRN  ?Patient can return home with the following Assistance with cooking/housework;Assist for transportation;Help with stairs or ramp for entrance ?  ?Equipment Recommendations ? None recommended by PT  ?  ?Recommendations for Other Services   ? ? ?  ?Precautions / Restrictions Precautions ?Precautions: Fall ?Restrictions ?Weight Bearing  Restrictions: No  ?  ? ?Mobility ? Bed Mobility ?Overal bed mobility: Needs Assistance ?Bed Mobility: Supine to Sit ?  ?  ?Supine to sit: Modified independent (Device/Increase time), HOB elevated ?  ?  ?General bed mobility comments: Pt was modified independent with bed mobility needing increased time. ?  ? ?Transfers ?Overall transfer level: Modified independent ?  ?Transfers: Sit to/from Stand ?Sit to Stand: Modified independent (Device/Increase time) ?  ?  ?  ?  ?  ?General transfer comment: Sit to stand was performed as and exercise x7 from low bed. He utilized R LE to stand and was able to progress to no UE to stand ?  ? ?Ambulation/Gait ?Ambulation/Gait assistance: Supervision ?Gait Distance (Feet): 300 Feet ?Assistive device: Straight cane ?Gait Pattern/deviations: Step-through pattern, Decreased step length - right, Decreased step length - left, Decreased stride length, Narrow base of support ?  ?  ?  ?General Gait Details: Pt had slow gait reporting that he is always careful and takes his time when he walks. Pt was able to navigate obsticles in the hallway without cuing. supervision was provided to ensure safety. ? ? ?Stairs ?  ?  ?  ?  ?  ? ? ?Wheelchair Mobility ?  ? ?Modified Rankin (Stroke Patients Only) ?  ? ? ?  ?Balance Overall balance assessment: Modified Independent ?Sitting-balance support: No upper extremity supported, Feet supported, Single extremity supported ?Sitting balance-Leahy Scale: Good ?Sitting balance - Comments: Pt was able to sit EOB occasionally using single UE support. ?  ?Standing balance support: Single extremity supported, During functional activity, Reliant on assistive device for balance ?Standing balance-Leahy Scale: Good ?Standing balance comment: Pt was supervision for normal walking in the hallway and utilized a cane on the right to help with  balance. ?  ?  ?  ?  ?  ?  ?  ?High Level Balance Comments: Pt was able to perform head turns left/right, Up/down during ambulation  without loss of balance. He was able to perform changes in speed with cued ans stepping over a glove box with out loss of balance. Min guard was provided during higher level balance activities. ?  ?  ?  ?  ? ?  ?Cognition Arousal/Alertness: Awake/alert ?Behavior During Therapy: Flat affect ?Overall Cognitive Status: Within Functional Limits for tasks assessed ?  ?  ?  ?  ?  ?  ?  ?  ?  ?  ?  ?  ?  ?  ?  ?  ?General Comments: Pt was A+Ox4 ?  ?  ? ?  ?Exercises   ? ?  ?General Comments General comments (skin integrity, edema, etc.): Pt repports occasional orthostatic symptoms upon standing but did not occur during session. ?  ?  ? ?Pertinent Vitals/Pain Pain Assessment ?Pain Assessment: No/denies pain  ? ? ?Home Living   ?  ?  ?  ?  ?  ?  ?  ?  ?  ?   ?  ?Prior Function    ?  ?  ?   ? ?PT Goals (current goals can now be found in the care plan section)   ? ?  ?Frequency ? ? ? Min 3X/week ? ? ? ?  ?PT Plan Current plan remains appropriate  ? ? ?Co-evaluation   ?  ?  ?  ?  ? ?  ?AM-PAC PT "6 Clicks" Mobility   ?Outcome Measure ? Help needed turning from your back to your side while in a flat bed without using bedrails?: None ?Help needed moving from lying on your back to sitting on the side of a flat bed without using bedrails?: None ?Help needed moving to and from a bed to a chair (including a wheelchair)?: None ?Help needed standing up from a chair using your arms (e.g., wheelchair or bedside chair)?: None ?Help needed to walk in hospital room?: None ?Help needed climbing 3-5 steps with a railing? : A Little ?6 Click Score: 23 ? ?  ?End of Session Equipment Utilized During Treatment: Gait belt ?Activity Tolerance: Patient tolerated treatment well ?Patient left: in chair;with call bell/phone within reach;with chair alarm set ?  ?PT Visit Diagnosis: Unsteadiness on feet (R26.81);Other abnormalities of gait and mobility (R26.89);Muscle weakness (generalized) (M62.81);History of falling (Z91.81);Dizziness and giddiness  (R42) ?  ? ? ?Time: 9702-6378 ?PT Time Calculation (min) (ACUTE ONLY): 20 min ? ?Charges:  $Gait Training: 8-22 mins          ?          ? ?Quenton Fetter, SPT ? ? ? ?Quenton Fetter ?03/06/2022, 12:11 PM ? ?

## 2022-03-06 NOTE — Plan of Care (Signed)
?  Problem: Education: ?Goal: Expressions of having a comfortable level of knowledge regarding the disease process will increase ?Outcome: Adequate for Discharge ?  ?Problem: Coping: ?Goal: Ability to adjust to condition or change in health will improve ?Outcome: Adequate for Discharge ?Goal: Ability to identify appropriate support needs will improve ?Outcome: Adequate for Discharge ?  ?Problem: Health Behavior/Discharge Planning: ?Goal: Compliance with prescribed medication regimen will improve ?Outcome: Adequate for Discharge ?  ?Problem: Medication: ?Goal: Risk for medication side effects will decrease ?Outcome: Adequate for Discharge ?  ?Problem: Clinical Measurements: ?Goal: Complications related to the disease process, condition or treatment will be avoided or minimized ?Outcome: Adequate for Discharge ?Goal: Diagnostic test results will improve ?Outcome: Adequate for Discharge ?  ?Problem: Safety: ?Goal: Verbalization of understanding the information provided will improve ?Outcome: Adequate for Discharge ?  ?Problem: Self-Concept: ?Goal: Level of anxiety will decrease ?Outcome: Adequate for Discharge ?Goal: Ability to verbalize feelings about condition will improve ?Outcome: Adequate for Discharge ?  ?

## 2022-03-06 NOTE — Plan of Care (Signed)
Pt is alert oriented x 4. Ambulatory. Pt c/o constipation, senakot and prn metamucil given. Pts last BM was last Friday. No complications noted to right hand. Pt resting.  ? ? ?Problem: Education: ?Goal: Expressions of having a comfortable level of knowledge regarding the disease process will increase ?Outcome: Progressing ?  ?Problem: Coping: ?Goal: Ability to adjust to condition or change in health will improve ?Outcome: Progressing ?Goal: Ability to identify appropriate support needs will improve ?Outcome: Progressing ?  ?Problem: Health Behavior/Discharge Planning: ?Goal: Compliance with prescribed medication regimen will improve ?Outcome: Progressing ?  ?Problem: Medication: ?Goal: Risk for medication side effects will decrease ?Outcome: Progressing ?  ?Problem: Clinical Measurements: ?Goal: Complications related to the disease process, condition or treatment will be avoided or minimized ?Outcome: Progressing ?Goal: Diagnostic test results will improve ?Outcome: Progressing ?  ?Problem: Safety: ?Goal: Verbalization of understanding the information provided will improve ?Outcome: Progressing ?  ?Problem: Self-Concept: ?Goal: Level of anxiety will decrease ?Outcome: Progressing ?Goal: Ability to verbalize feelings about condition will improve ?Outcome: Progressing ?  ?

## 2022-03-06 NOTE — Plan of Care (Signed)
?  Problem: Education: ?Goal: Expressions of having a comfortable level of knowledge regarding the disease process will increase ?03/06/2022 1310 by Laure Kidney, RN ?Outcome: Adequate for Discharge ?03/06/2022 1159 by Laure Kidney, RN ?Outcome: Adequate for Discharge ?  ?Problem: Coping: ?Goal: Ability to adjust to condition or change in health will improve ?03/06/2022 1310 by Laure Kidney, RN ?Outcome: Adequate for Discharge ?03/06/2022 1159 by Laure Kidney, RN ?Outcome: Adequate for Discharge ?Goal: Ability to identify appropriate support needs will improve ?03/06/2022 1310 by Laure Kidney, RN ?Outcome: Adequate for Discharge ?03/06/2022 1159 by Laure Kidney, RN ?Outcome: Adequate for Discharge ?  ?Problem: Health Behavior/Discharge Planning: ?Goal: Compliance with prescribed medication regimen will improve ?03/06/2022 1310 by Laure Kidney, RN ?Outcome: Adequate for Discharge ?03/06/2022 1159 by Laure Kidney, RN ?Outcome: Adequate for Discharge ?  ?Problem: Medication: ?Goal: Risk for medication side effects will decrease ?03/06/2022 1310 by Laure Kidney, RN ?Outcome: Adequate for Discharge ?03/06/2022 1159 by Laure Kidney, RN ?Outcome: Adequate for Discharge ?  ?Problem: Clinical Measurements: ?Goal: Complications related to the disease process, condition or treatment will be avoided or minimized ?03/06/2022 1310 by Laure Kidney, RN ?Outcome: Adequate for Discharge ?03/06/2022 1159 by Laure Kidney, RN ?Outcome: Adequate for Discharge ?Goal: Diagnostic test results will improve ?03/06/2022 1310 by Laure Kidney, RN ?Outcome: Adequate for Discharge ?03/06/2022 1159 by Laure Kidney, RN ?Outcome: Adequate for Discharge ?  ?Problem: Safety: ?Goal: Verbalization of understanding the information provided will improve ?03/06/2022 1310 by Laure Kidney, RN ?Outcome: Adequate for Discharge ?03/06/2022 1159 by Laure Kidney, RN ?Outcome: Adequate for Discharge ?  ?Problem: Self-Concept: ?Goal: Level of anxiety  will decrease ?03/06/2022 1310 by Laure Kidney, RN ?Outcome: Adequate for Discharge ?03/06/2022 1159 by Laure Kidney, RN ?Outcome: Adequate for Discharge ?Goal: Ability to verbalize feelings about condition will improve ?03/06/2022 1310 by Laure Kidney, RN ?Outcome: Adequate for Discharge ?03/06/2022 1159 by Laure Kidney, RN ?Outcome: Adequate for Discharge ?  ?Problem: Education: ?Goal: Knowledge of General Education information will improve ?Description: Including pain rating scale, medication(s)/side effects and non-pharmacologic comfort measures ?Outcome: Adequate for Discharge ?  ?Problem: Health Behavior/Discharge Planning: ?Goal: Ability to manage health-related needs will improve ?Outcome: Adequate for Discharge ?  ?Problem: Clinical Measurements: ?Goal: Ability to maintain clinical measurements within normal limits will improve ?Outcome: Adequate for Discharge ?Goal: Will remain free from infection ?Outcome: Adequate for Discharge ?Goal: Diagnostic test results will improve ?Outcome: Adequate for Discharge ?Goal: Respiratory complications will improve ?Outcome: Adequate for Discharge ?Goal: Cardiovascular complication will be avoided ?Outcome: Adequate for Discharge ?  ?Problem: Activity: ?Goal: Risk for activity intolerance will decrease ?Outcome: Adequate for Discharge ?  ?Problem: Nutrition: ?Goal: Adequate nutrition will be maintained ?Outcome: Adequate for Discharge ?  ?Problem: Coping: ?Goal: Level of anxiety will decrease ?Outcome: Adequate for Discharge ?  ?Problem: Elimination: ?Goal: Will not experience complications related to bowel motility ?Outcome: Adequate for Discharge ?Goal: Will not experience complications related to urinary retention ?Outcome: Adequate for Discharge ?  ?Problem: Pain Managment: ?Goal: General experience of comfort will improve ?Outcome: Adequate for Discharge ?  ?Problem: Safety: ?Goal: Ability to remain free from injury will improve ?Outcome: Adequate for  Discharge ?  ?Problem: Skin Integrity: ?Goal: Risk for impaired skin integrity will decrease ?Outcome: Adequate for Discharge ?  ?

## 2022-03-06 NOTE — Discharge Summary (Signed)
?Physician Discharge Summary ?  ?Patient: Wesley [name] MRN: [id number] DOB: 01-24-1956  ?Admit date:     03/02/2022  ?Discharge date: 03/06/22  ?Discharge Physician: Corrie Mckusick Braniya Farrugia  ? ?PCP: [facility]  ? ?Recommendations at discharge:  ? ?Follow-up with your primary care provider in 1 week. ?Follow-up with your neurologist at the [place].  Patient had encephalopathy  symptoms but is on multiple medications/possibility of early dementia with Parkinson's disease.   ? ?Discharge Diagnoses: ?Principal Problem: ?  Acute encephalopathy ?Active Problems: ?  Seizure disorder ([place]) ?  History of substance abuse ([place]) ?  Diabetes mellitus type 2 in obese [facility]) ?  Normal anion gap metabolic acidosis ?  Cardiac pacemaker in situ ?  Hypokalemia ?  Hypomagnesemia ?  Acute metabolic encephalopathy ? ?Resolved Problems: ?  * No resolved hospital problems. * ? ?Hospital Course: ?Wesley [name] is a 66 y.o. male with past medical history of COPD, coronary disease, previous stroke with residual left-sided weakness, seizure disorder on Keppra, diabetes mellitus type 2, dementia, status post pacemaker for sick sinus syndrome presented to hospital with generalized fatigue weakness decreased appetite and some confusion.  Of note, patient is on Keppra and is compliant.  Patient was then admitted hospital with generalized weakness, acute encephalopathy.  Neurology saw the patient who recommended a CTA of the head and neck and cerebral venous angiogram.  There was some abnormality in the CT head scan and patient underwent diagnostic angiogram on 03/05/2022 with no findings of stenosis.  It was thought to be artifact.  ? ?Assessment and Plan: ?* Acute encephalopathy ?Acute metabolic encephalopathy.  Resolved at this time.  Could be polypharmacy, early onset dementia from Parkinson's disease.  Spoke with the patient's wife about her neurology follow-up as outpatient..  Work-up was largely negative except for non-anion gap  metabolic acidosis.  during hospitalization..  Vitamin B12 594, folate of 8.5.  Folic acid at 8.5.  TSH 3.3.  Ammonia mildly elevated at 59 but the liver function test with normal.  EEG was unremarkable.  Hemoglobin A1c of 7.3. Unable to do MRI due to pacemaker.  Neurology wasconsulted with recommendations for CT angio head and neck and CT venogram.  Abnormal CT scan report noted to patient underwent diagnostic cerebral angiogram on 03/05/2022 without any stenosis and it was thought to be artifact..  Continue Keppra, Sinemet from home.  ? ?Seizure disorder ([place]) ?Neurology was consulted.  EEG was unremarkable.  Patient will continue Keppra and gabapentin on discharge. ? ? ?Normal anion gap metabolic acidosis ?Could be secondary to methazolamide that the patient was on.    Lactic acid was 1.9.  Will resume in couple of days after discharge. ? ?Diabetes mellitus type 2 in obese [facility]) ?poorly controlled. On Ozempic and metformin at home.  Patient received sliding scale insulin Accu-Cheks during hospitalization. ? ?History of substance abuse ([place]) ?Urine drug screen was positive for THC.  Counseled against it. ? ?Hypokalemia ?Replenished and has improved at this time.  Latest potassium at 4.0 ? ?Cardiac pacemaker in situ ?History of sick sinus syndrome. ? ?Hypomagnesemia ?Mild.  Replenished.  Latest magnesium of 1.7 ? ? ?Consultants: Neurology ? ?Procedures performed:  ?EEG ?Diagnostic cerebral angiogram on 03/05/2022 ? ?Disposition: Home with home health ? ?Diet recommendation:  ?Discharge Diet Orders (From admission, onward)  ? ?  Start     Ordered  ? 03/06/22 0000  Diet - low sodium heart healthy       ? 03/06/22 0929  ? ?  ?  ? ?  ? ?  Cardiac diet ?DISCHARGE MEDICATION: ?Allergies as of 03/06/2022   ? ?   Reactions  ? Penicillins Swelling  ? "General swelling" Tolerated cefepime & ceftriaxone previously. ?Has patient had a PCN reaction causing immediate rash, facial/tongue/throat swelling, SOB or lightheadedness with  hypotension: Yes ?Has patient had a PCN reaction causing severe rash involving mucus membranes or skin necrosis: Unk ?Has patient had a PCN reaction that required hospitalization: Yes ?Has patient had a PCN reaction occurring within the last 10 years: No ?If all of the above answers are "NO", then may proceed with Cephalosporin  ? Percocet [oxycodone-acetaminophen]   ? Itching  ? Levaquin [levofloxacin In D5w] Hives  ? ?  ? ?  ?Medication List  ?  ? ?STOP taking these medications   ? ?pantoprazole 20 MG tablet ?Commonly known as: PROTONIX ?  ? ?  ? ?TAKE these medications   ? ?Accu-Chek Aviva Plus w/Device Kit ?1 Device by Does not apply route 4 (four) times daily. ?  ?acetaminophen 500 MG tablet ?Commonly known as: TYLENOL ?Take 1,000 mg by mouth every 6 (six) hours as needed for moderate pain or headache. ?  ?albuterol 108 (90 Base) MCG/ACT inhaler ?Commonly known as: VENTOLIN HFA ?Inhale 2 puffs into the lungs every 6 (six) hours as needed for wheezing or shortness of breath. ?  ?aspirin EC 325 MG tablet ?Take 325 mg by mouth daily. ?  ?atorvastatin 20 MG tablet ?Commonly known as: LIPITOR ?Take 20 mg by mouth daily. ?  ?Breztri Aerosphere 160-9-4.8 MCG/ACT Aero ?Generic drug: Budeson-Glycopyrrol-Formoterol ?Inhale 2 puffs into the lungs in the morning and at bedtime. ?  ?Brinzolamide-Brimonidine 1-0.2 % Susp ?Place 1 drop into both eyes 3 (three) times daily. ?  ?carbidopa-levodopa 25-100 MG tablet ?Commonly known as: SINEMET IR ?Take 1 tablet by mouth 3 (three) times daily. ?  ?carboxymethylcellulose 0.5 % Soln ?Commonly known as: REFRESH PLUS ?Place 1 drop into both eyes every 4 (four) hours as needed (for irritation). ?  ?cetirizine 10 MG tablet ?Commonly known as: ZYRTEC ?Take 1 tablet (10 mg total) by mouth daily. ?What changed:  ?when to take this ?reasons to take this ?  ?diclofenac sodium 1 % Gel ?Commonly known as: VOLTAREN ?Apply 4 g topically 4 (four) times daily as needed (pain.). ?  ?dicyclomine 20  MG tablet ?Commonly known as: BENTYL ?Take 0.5 tablets (10 mg total) by mouth 2 (two) times daily as needed for up to 5 days for spasms. ?  ?famotidine 40 MG tablet ?Commonly known as: PEPCID ?Take 40 mg by mouth at bedtime. ?  ?finasteride 5 MG tablet ?Commonly known as: PROSCAR ?Take 5 mg by mouth daily. ?  ?fluticasone 50 MCG/ACT nasal spray ?Commonly known as: FLONASE ?Place 1 spray into both nostrils daily. ?  ?gabapentin 400 MG capsule ?Commonly known as: NEURONTIN ?Take 400 mg by mouth 3 (three) times daily. ?  ?glucose blood test strip ?Commonly known as: Accu-Chek Aviva ?Use as instructed ?What changed:  ?when to take this ?additional instructions ?  ?insulin aspart protamine - aspart (70-30) 100 UNIT/ML FlexPen ?Commonly known as: NOVOLOG 70/30 MIX ?Inject 30 Units into the skin 2 (two) times daily with a meal. ?  ?latanoprost 0.005 % ophthalmic solution ?Commonly known as: XALATAN ?Place 1 drop into both eyes at bedtime. ?  ?levETIRAcetam 500 MG tablet ?Commonly known as: KEPPRA ?Take 1,000 mg by mouth 2 (two) times daily. ?  ?metFORMIN 1000 MG tablet ?Commonly known as: GLUCOPHAGE ?Take 1,000 mg by mouth 2 (two)  times daily. ?  ?methazolamide 50 MG tablet ?Commonly known as: NEPTAZANE ?Take 1 tablet (50 mg total) by mouth daily. ?  ?mirabegron ER 50 MG Tb24 tablet ?Commonly known as: MYRBETRIQ ?Take 50 mg by mouth daily. ?  ?mirtazapine 15 MG tablet ?Commonly known as: REMERON ?Take 15 mg by mouth at bedtime. ?  ?mometasone 220 MCG/ACT inhaler ?Commonly known as: ASMANEX ?Inhale 2 puffs into the lungs daily. At night ?  ?Netarsudil Dimesylate 0.02 % Soln ?Place 1 drop into both eyes daily. ?  ?nicotine 7 mg/24hr patch ?Commonly known as: NICODERM CQ - dosed in mg/24 hr ?Place 7 mg onto the skin daily. ?  ?nicotine polacrilex 2 MG gum ?Commonly known as: NICORETTE ?Take 2 mg by mouth 3 (three) times daily as needed for smoking cessation. ?  ?OZEMPIC (0.25 OR 0.5 MG/DOSE) [place] ?Inject 0.5 mg into the skin  every Friday. ?  ?promethazine 25 MG tablet ?Commonly known as: PHENERGAN ?Take 12.5 mg by mouth 3 (three) times daily as needed for nausea or vomiting. ?  ?psyllium 28 % packet ?Commonly known as: METAMUCIL SMOO

## 2022-03-06 NOTE — TOC Transition Note (Signed)
Transition of Care (TOC) - CM/SW Discharge Note ? ? ?Patient Details  ?Name: Wesley [name] ?MRN: [id number] ?Date of Birth: Oct 24, 1956 ? ?Transition of Care (TOC) CM/SW Contact:  ?Pollie Friar, RN ?Phone Number: ?03/06/2022, 9:37 AM ? ? ?Clinical Narrative:    ?Patient discharging home with home health services through [place]. Information on the AVS. ?Patient has transportation home. ? ? ?Final next level of care: [place] ?Barriers to Discharge: No Barriers Identified ? ? ?Patient Goals and CMS Choice ?  ?CMS Medicare.gov Compare Post Acute Care list provided to:: Patient ?Choice offered to / list presented to : Patient ? ?Discharge Placement ?  ?           ?  ?  ?  ?  ? ?Discharge Plan and Services ?  ?  ?           ?  ?  ?  ?  ?  ?HH Arranged: PT ?[place] Agency: [place] [facility] ?Date HH Agency Contacted: 03/04/22 ?  ?Representative spoke with at [facility]: Corene Cornea ? ?Social Determinants of Health (SDOH) Interventions ?  ? ? ?Readmission Risk Interventions ?   ? View : No data to display.  ?  ?  ?  ? ? ? ? ? ?

## 2022-03-08 LAB — CULTURE, BLOOD (ROUTINE X 2)
Culture: NO GROWTH
Special Requests: ADEQUATE

## 2022-05-19 DIAGNOSIS — R1319 Other dysphagia: Secondary | ICD-10-CM | POA: Diagnosis not present

## 2022-06-20 DIAGNOSIS — I10 Essential (primary) hypertension: Secondary | ICD-10-CM | POA: Diagnosis not present

## 2022-06-20 DIAGNOSIS — R1310 Dysphagia, unspecified: Secondary | ICD-10-CM | POA: Diagnosis not present

## 2022-06-20 DIAGNOSIS — G4730 Sleep apnea, unspecified: Secondary | ICD-10-CM | POA: Diagnosis not present

## 2022-06-20 DIAGNOSIS — R12 Heartburn: Secondary | ICD-10-CM | POA: Diagnosis not present

## 2022-06-20 DIAGNOSIS — K449 Diaphragmatic hernia without obstruction or gangrene: Secondary | ICD-10-CM | POA: Diagnosis not present

## 2022-06-20 DIAGNOSIS — J439 Emphysema, unspecified: Secondary | ICD-10-CM | POA: Diagnosis not present

## 2022-06-20 DIAGNOSIS — I2510 Atherosclerotic heart disease of native coronary artery without angina pectoris: Secondary | ICD-10-CM | POA: Diagnosis not present

## 2022-06-20 DIAGNOSIS — R1319 Other dysphagia: Secondary | ICD-10-CM | POA: Diagnosis not present

## 2022-06-20 DIAGNOSIS — Z950 Presence of cardiac pacemaker: Secondary | ICD-10-CM | POA: Diagnosis not present

## 2022-06-20 DIAGNOSIS — I252 Old myocardial infarction: Secondary | ICD-10-CM | POA: Diagnosis not present

## 2022-06-20 DIAGNOSIS — R1110 Vomiting, unspecified: Secondary | ICD-10-CM | POA: Diagnosis not present

## 2022-07-10 ENCOUNTER — Ambulatory Visit (INDEPENDENT_AMBULATORY_CARE_PROVIDER_SITE_OTHER): Payer: No Typology Code available for payment source | Admitting: Pulmonary Disease

## 2022-07-10 ENCOUNTER — Encounter: Payer: Self-pay | Admitting: Pulmonary Disease

## 2022-07-10 VITALS — BP 120/80 | HR 67 | Ht 74.0 in | Wt 201.2 lb

## 2022-07-10 DIAGNOSIS — J449 Chronic obstructive pulmonary disease, unspecified: Secondary | ICD-10-CM

## 2022-07-10 MED ORDER — BREZTRI AEROSPHERE 160-9-4.8 MCG/ACT IN AERO: 2.0000 | INHALATION_SPRAY | Freq: Two times a day (BID) | RESPIRATORY_TRACT | 11 refills | Status: DC

## 2022-07-10 MED ORDER — PREDNISONE 20 MG PO TABS: 40.0000 mg | ORAL_TABLET | Freq: Every day | ORAL | 0 refills | Status: AC

## 2022-07-10 NOTE — Progress Notes (Signed)
'@Patient'  ID: Wesley [name], male    DOB: 02-22-1956, 66 y.o.   MRN: [id number]  Chief Complaint  Patient presents with   Follow-up    Follow-up SOB, Wheezing, some cough    Referring provider: [facility]  HPI:   66 y.o. man whom we are seeing in follow up for evaluation of COPD.  D/c summary 02/2302 with seizures reviewed.   Patient returns for routine follow-up.  He states worsening dyspnea, worsening cough today in the mornings with worsening sputum production.  Present for about 5 weeks.  Started around [facility].  Notes never had a cold.  Thinks pass along to him.  While the more severe symptoms of improved this lingering cough and dyspnea have persisted.  At last visit he was prescribed Breztri to consolidate Stiolto in the morning and he was taken Asmanex at night.  Unfortunate, this was too expensive via local pharmacy.  Discussed do not think the [facility] covers this but happy to send to the [place] to see what they say.  We discussed worsening symptoms may be related to COPD exacerbation that is lingering.  Discussed role and rationale for changing inhaler therapy including Asmanex to twice a day, continue Stiolto.  Trial of prednisone as below.  HPI at initial visit: Patient doing well.  Baseline dyspnea stable.  Has COPD for many years.  Was seen in the clinic here for several years.  Then transferred care to the [place].  [place] pulmonologist has also retired now here establish care.  Has baseline dyspnea with stairs and inclines.  No better or worse.  He feels Stiolto is very beneficial in terms of helping his shortness of breath.  He takes Asmanex at night only.  He is not sure how much it helped.  He notes he sleeps well but not sure how much it helps his breathing as he is sleeping when this medication is in the system.  Reviewed most recent cross-sectional image in our EMR 11/20/2021 though my review and interpretation shows no PE, bullous emphysematous changes, no other  abnormality.  Reviewed results of most recent CT lung cancer screening 01/17/2022 report lung RADS 2, several stable subcentimeter nodules noted.  PMH: Emphysema, asthma, GERD Surgical history: Prostate resection, cardiac cath, cystoscopy and stent placement Family history: Mother and father both with cancer, sister with lung cancer Social history: Current smoker, down to 7 cigarettes a day, trying to cut down, down significantly from 1 pack a day, lives in [place] / Pulmonary Flowsheets:   ACT:      No data to display          MMRC:     No data to display          Epworth:     11/18/2016   11:00 AM  Results of the Epworth flowsheet  Sitting and reading 0  Watching TV 2  Sitting, inactive in a public place (e.g. a theatre or a meeting) 1  As a passenger in a car for an hour without a break 0  Lying down to rest in the afternoon when circumstances permit 1  Sitting and talking to someone 0  Sitting quietly after a lunch without alcohol 0  In a car, while stopped for a few minutes in traffic 0  Total score 4    Tests:   FENO:  No results found for: "NITRICOXIDE"  PFT:    Latest Ref Rng & Units 04/17/2016   10:18 AM  PFT Results  FVC-Pre L 3.45   FVC-Predicted Pre % 74   FVC-Post L 3.79   FVC-Predicted Post % 81   Pre FEV1/FVC % % 55   Post FEV1/FCV % % 59   FEV1-Pre L 1.89   FEV1-Predicted Pre % 52   FEV1-Post L 2.24   DLCO uncorrected ml/min/mmHg 17.65   DLCO UNC% % 46   DLVA Predicted % 64   TLC L 6.73   TLC % Predicted % 86   RV % Predicted % 124   Personally reviewed interpreted as moderate fixed obstruction, significant bronchodilator response, lung volumes consistent with air trapping, DLCO severely reduced, DLCO improves corrected for alveolar volume  WALK:     08/26/2018    4:18 PM  SIX MIN WALK  2 Minute Oxygen Saturation % 93 %  2 Minute HR 75  4 Minute Oxygen Saturation % 95 %  4 Minute HR 79  6 Minute Oxygen  Saturation % 95 %  6 Minute HR 76    Imaging: Personally reviewed and as per EMR discussion this note   Lab Results: Personally reviewed CBC    Component Value Date/Time   WBC 5.9 03/04/2022 0059   RBC 4.45 03/04/2022 0059   HGB 12.4 (L) 03/04/2022 0059   HCT 39.0 03/04/2022 0059   HCT TEST REQUEST RECEIVED WITHOUT APPROPRIATE SPECIMEN 05/20/2017 0225   PLT 158 03/04/2022 0059   MCV 87.6 03/04/2022 0059   MCH 27.9 03/04/2022 0059   MCHC 31.8 03/04/2022 0059   RDW 14.7 03/04/2022 0059   LYMPHSABS 2.8 03/03/2022 0004   MONOABS 0.3 03/03/2022 0004   EOSABS 0.2 03/03/2022 0004   BASOSABS 0.0 03/03/2022 0004    BMET    Component Value Date/Time   NA 140 03/04/2022 0059   K 4.0 03/04/2022 0059   CL 112 (H) 03/04/2022 0059   CO2 21 (L) 03/04/2022 0059   GLUCOSE 93 03/04/2022 0059   BUN 13 03/04/2022 0059   CREATININE 0.87 03/04/2022 0059   CREATININE 0.96 03/14/2019 1046   CALCIUM 8.7 (L) 03/04/2022 0059   GFRNONAA >60 03/04/2022 0059   GFRAA >60 09/07/2020 1156    BNP    Component Value Date/Time   BNP 5.2 02/03/2018 1813    ProBNP No results found for: "PROBNP"  Specialty Problems       Pulmonary Problems   COPD mixed type (HCC)   Obstructive sleep apnea   Pneumonia of right middle lobe due to infectious organism   CAP (community acquired pneumonia)    Allergies  Allergen Reactions   Penicillins Swelling    "General swelling" Tolerated cefepime & ceftriaxone previously. Has patient had a PCN reaction causing immediate rash, facial/tongue/throat swelling, SOB or lightheadedness with hypotension: Yes Has patient had a PCN reaction causing severe rash involving mucus membranes or skin necrosis: Unk Has patient had a PCN reaction that required hospitalization: Yes Has patient had a PCN reaction occurring within the last 10 years: No If all of the above answers are "NO", then may proceed with Cephalosporin   Percocet [Oxycodone-Acetaminophen]     Itching    Levaquin [Levofloxacin In D5w] Hives    Immunization History  Administered Date(s) Administered   Fluad Quad(high Dose 65+) 01/17/2022   Influenza Split 10/05/2016   Influenza,inj,Quad PF,6+ Mos 10/04/2015, 08/29/2016, 11/17/2017, 09/08/2019, 08/30/2020   Influenza,inj,quad, With Preservative 03/10/2019   Influenza-Unspecified 09/14/2017   Moderna Covid-19 Vaccine Bivalent Booster 69yr & up 01/17/2022   Moderna Sars-Covid-2 Vaccination 06/12/2021   PFIZER(Purple  Top)SARS-COV-2 Vaccination 02/14/2020, 03/06/2020, 09/25/2020   Pneumococcal Conjugate-13 11/17/2016   Pneumococcal Polysaccharide-23 03/30/2017, 04/14/2017   Tdap 11/17/2016, 03/30/2017   Zoster Recombinat (Shingrix) 06/02/2019, 10/07/2019    Past Medical History:  Diagnosis Date   Aneurysm of infrarenal abdominal aorta ([place])    CT 11-23-2018 , 3.2cm   Anxiety    Benign localized prostatic hyperplasia with lower urinary tract symptoms (LUTS)    Cancer (HCC)    kidney cancer   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   CAP (community acquired pneumonia) 11/23/2018   per pt had follow up by pcp at [facility] with CXR done after christmas   Chronic insomnia    COPD with emphysema ([place])    Coronary artery disease    followed by cardiologist @ [facility]---  per cardiac cath 07-11-2015 mild plaquing of the CFx and RCA, normal LVF [facility] [place] copy in epic)   Dementia [facility])    "some per wife"    Depression    Diabetes mellitus without complication ([place])    type 2    Diverticulosis of colon    GERD (gastroesophageal reflux disease)    Glaucoma    Hematuria    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 2001    History of urinary retention    Hyperlipidemia    Hypertension    Incomplete emptying of bladder    Left-sided weakness 08/27/2016   CVA residual   Myocardial infarction [facility]) 2015   Neuromuscular disorder (HCC)    neuropathy feet and hands   OA (osteoarthritis)    knees, feet, wrists, back   Pacemaker    St. Jude   Paroxysmal atrial tachycardia ([place])    Polysubstance abuse ([place])    per pt from 2001 to 2016 has had couple of relapses since--  12-31-2018 per pt last relapse with cocaine approx. Oct 2019   Renal mass, left    pelvic   Retinal vein occlusion 01/2016   right eye, secondary to hypertension   S/P placement of cardiac pacemaker 12/11/2014   St Jude dual chamber (followed by [facility])   Seizures [facility])    wife reported last one  2 WEEKS AGO AS OF 07-29-2019   Sepsis ([place]) 02/2019   WENT HOME ON IV MEDS FOR 2  1/2 WEEKS   Shortness of breath    WITH ACTIVITY   Sleep apnea    does not tolerate cpap   SSS (sick sinus syndrome) ([place])    treatment pacemaker placement   Stroke ([place]) 2017 OR 2018   had therapy  slow movement now ( 02/01/2019)  right side weaker   Symptomatic bradycardia    Syncope    Tremor, essential 03/31/2016   Tuberculosis    1990s    Wears glasses    Wears hearing aid in both ears     Tobacco History: Social History   Tobacco Use  Smoking Status Every Day   Packs/day: 0.50   Years: 40.00   Total pack years: 20.00   Types: Cigarettes  Smokeless Tobacco Never  Tobacco Comments   per trying to quit with nicotine patch,  Patty Sermons 07/10/2022 is down to 7 cigs a day    Ready to quit: Not Answered Counseling given: Not Answered Tobacco comments: per trying to quit with nicotine patch,  Patty Sermons 07/10/2022 is down to 7 cigs a day    Continue to not smoke  Outpatient Encounter Medications as of 07/10/2022  Medication Sig   albuterol (PROVENTIL HFA;VENTOLIN HFA) 108 (90 Base) MCG/ACT inhaler Inhale 2 puffs into the lungs every 6 (six) hours as  needed for wheezing or shortness of breath.   Budeson-Glycopyrrol-Formoterol (BREZTRI AEROSPHERE) 160-9-4.8 MCG/ACT AERO Inhale 2 puffs into the lungs in the morning and at bedtime.   cetirizine (ZYRTEC) 10 MG tablet Take 1 tablet (10 mg total) by mouth daily. (Patient taking differently: Take 10 mg by mouth daily as needed for allergies.)   fluticasone (FLONASE) 50 MCG/ACT nasal spray Place 1 spray into both nostrils daily.   predniSONE (DELTASONE) 20 MG tablet Take 2 tablets (40 mg total) by mouth daily with breakfast for 5 days.   Tiotropium Bromide-Olodaterol 2.5-2.5 MCG/ACT AERS Inhale 2 puffs into the lungs daily.    acetaminophen (TYLENOL) 500 MG tablet Take 1,000 mg by mouth every 6 (six) hours as needed for moderate pain or headache.   aspirin EC 325 MG tablet Take 325 mg by mouth daily.   atorvastatin (LIPITOR) 20 MG tablet Take 20 mg by mouth daily.   Blood Glucose Monitoring Suppl (ACCU-CHEK AVIVA PLUS) w/Device KIT 1 Device by Does not apply route 4 (four) times daily.   Brinzolamide-Brimonidine 1-0.2 % SUSP Place 1 drop into both eyes 3 (three) times daily.   carbidopa-levodopa (SINEMET IR) 25-100 MG tablet Take 1 tablet by mouth 3 (three) times daily.   carboxymethylcellulose (REFRESH PLUS) 0.5 % SOLN Place 1 drop into both eyes every 4 (four) hours as needed (for irritation).   Cholecalciferol (VITAMIN D3) 50 MCG (2000 UT) TABS Take 4,000 Units by mouth daily.   diclofenac sodium (VOLTAREN) 1 % GEL Apply 4 g topically 4 (four) times daily as needed (pain.).    dicyclomine (BENTYL) 20 MG tablet Take 0.5 tablets (10 mg total) by mouth 2 (two) times daily as needed for up to 5 days for spasms. (Patient not taking: Reported on 03/02/2022)   famotidine (PEPCID) 40 MG tablet Take 40 mg by mouth at bedtime.   finasteride (PROSCAR) 5 MG tablet Take 5 mg by mouth daily.    gabapentin (NEURONTIN) 400 MG capsule Take 400 mg by mouth 3 (three) times daily.    glucose blood (ACCU-CHEK AVIVA)  test strip Use as instructed (Patient taking differently: 4 (four) times daily -  before meals and at bedtime.)   insulin aspart protamine - aspart (NOVOLOG 70/30 MIX) (70-30) 100 UNIT/ML FlexPen Inject 30 Units into the skin 2 (two) times daily with a meal.   latanoprost (XALATAN) 0.005 % ophthalmic solution Place 1 drop into both eyes at bedtime.    levETIRAcetam (KEPPRA) 500 MG tablet Take 1,000 mg by mouth 2 (two) times daily.   metFORMIN (GLUCOPHAGE) 1000 MG tablet Take 1,000 mg by mouth 2 (two) times daily.   methazolamide (NEPTAZANE) 50 MG tablet Take 1 tablet (50 mg total) by mouth daily.   mirabegron ER (MYRBETRIQ) 50 MG TB24 tablet Take 50 mg by mouth daily.   mirtazapine (REMERON) 15 MG tablet Take 15 mg by mouth at bedtime.   mometasone (ASMANEX) 220 MCG/ACT inhaler Inhale 2 puffs into the lungs daily. At night   Netarsudil Dimesylate 0.02 % SOLN Place 1  drop into both eyes daily.   nicotine (NICODERM CQ - DOSED IN MG/24 HR) 7 mg/24hr patch Place 7 mg onto the skin daily.   nicotine polacrilex (NICORETTE) 2 MG gum Take 2 mg by mouth 3 (three) times daily as needed for smoking cessation.   promethazine (PHENERGAN) 25 MG tablet Take 12.5 mg by mouth 3 (three) times daily as needed for nausea or vomiting.   psyllium (METAMUCIL SMOOTH TEXTURE) 28 % packet Take 1 packet by mouth daily as needed (for constipation).   Semaglutide (OZEMPIC, 0.25 OR 0.5 MG/DOSE, [place]) Inject 0.5 mg into the skin every Friday.   senna (SENOKOT) 8.6 MG TABS tablet Take 1 tablet by mouth at bedtime.   sucralfate (CARAFATE) 1 GM/10ML suspension Take 10 mLs (1 g total) by mouth 4 (four) times daily -  with meals and at bedtime. (Patient taking differently: Take 1 g by mouth 4 (four) times daily as needed (stomach pain).)   tamsulosin (FLOMAX) 0.4 MG CAPS capsule Take 1 capsule (0.4 mg total) by mouth daily after supper. (Patient taking differently: Take 0.4 mg by mouth daily.)   traMADol (ULTRAM) 50 MG tablet Take 1-2  tablets (50-100 mg total) by mouth every 6 (six) hours as needed for moderate pain or severe pain. Post-operatively   venlafaxine XR (EFFEXOR-XR) 75 MG 24 hr capsule Take 225 mg by mouth daily with breakfast.   vitamin B-12 (CYANOCOBALAMIN) 500 MCG tablet Take 500 mcg by mouth daily.   [DISCONTINUED] Budeson-Glycopyrrol-Formoterol (BREZTRI AEROSPHERE) 160-9-4.8 MCG/ACT AERO Inhale 2 puffs into the lungs in the morning and at bedtime.   No facility-administered encounter medications on file as of 07/10/2022.     Review of Systems  Review of Systems  N/a Physical Exam  BP 120/80 (BP Location: Left Arm)   Pulse 67   Ht '6\' 2"'  (1.88 m)   Wt 201 lb 3.2 oz (91.3 kg)   SpO2 98%   BMI 25.83 kg/m   Wt Readings from Last 5 Encounters:  07/10/22 201 lb 3.2 oz (91.3 kg)  03/02/22 190 lb 14.7 oz (86.6 kg)  01/29/22 191 lb (86.6 kg)  12/11/21 191 lb 6 oz (86.8 kg)  11/20/21 200 lb (90.7 kg)    BMI Readings from Last 5 Encounters:  07/10/22 25.83 kg/m  03/02/22 24.51 kg/m  01/29/22 24.52 kg/m  12/11/21 24.57 kg/m  11/20/21 25.68 kg/m     Physical Exam General: Sitting in chair, no acute distress Eyes: EOMI, icterus Neck: Supple, no JVP Pulmonary: Distant, no wheeze Cardiovascular: Regular rate and rhythm, no murmur Abdomen: Nondistended, bowel sounds present MSK: No synovitis, joint effusion Neuro: Normal gait, no weakness Psych: Normal mood, full affect   Assessment & Plan:   COPD with concern for ongoing exacerbation: As demonstrated on prior PFTs.  Gold B.  Historically does not frequently exacerbate.  Currently on Stiolto once daily and using Asmanex only at night.  Instructed to increase the Asmanex use twice daily and continue Stiolto.  Prednisone 40 mg daily x5 days given worsening DOE/cough following cold/URI symptoms.  Recent antibiotic use for UTI per urologist.  I cannot see what this was.  We will send a script for Judithann Sauger to see if now covered under insurance  where previously too expensive.   Tobacco abuse: Currently up-to-date with lung cancer screening via the [place], most recent scan 01/17/2022 lung RADS 2.    Return in about 3 months (around 10/10/2022).   Lanier Clam, MD 07/10/2022

## 2022-07-10 NOTE — Patient Instructions (Addendum)
It is nice to see you again  To help with the more recent symptoms of worsening cough some shortness of breath I recommend taking prednisone 40 mg daily for 5 days then stop  Continue Stiolto in the mornings once a day.  Increase Asmanex to 2 puffs in the morning and 2 puffs at night.  Rinse your mouth out after every use.  I will send a prescription for Breztri to the [place].  As of my last check they were not covering this medication but lets try again.  If you are able to get the breast treat this with replace the Stiolto and the Asmanex.  The dose of Breztri is 2 puffs twice a day, rinse your mouth out after every use.  Return to clinic in 3 months or sooner as needed with Dr. Silas Flood

## 2022-07-15 ENCOUNTER — Telehealth: Payer: Self-pay

## 2022-07-15 NOTE — Telephone Encounter (Signed)
Patient Advocate Encounter   Received notification from [place] that prior authorization is required for [facility].  This plan requires documentation that preferred Spiriva and Wixela are contraindicated or therapeutically failed in the past. Requesting confirmation. Prior Auth not submitted at this time.  Clista Bernhardt, CPhT Rx Patient Advocate Specialist Phone: [phone]

## 2022-07-15 NOTE — Telephone Encounter (Signed)
Note has been addended for prior authorization for [employer].

## 2022-07-15 NOTE — Telephone Encounter (Signed)
Sir can you   Please add chart notes and message the Rx Prior Auth Team if wixela and spiriva have been try/failed so that the request can be submitted with documentation, Thanks!   Please advise

## 2022-07-16 ENCOUNTER — Other Ambulatory Visit (HOSPITAL_COMMUNITY): Payer: Self-pay

## 2022-07-16 NOTE — Telephone Encounter (Signed)
Called and left voicemail for patient to call office back in regards to [facility]

## 2022-07-16 NOTE — Telephone Encounter (Signed)
Please let him know can based on test claim he [facility] to get [facility] [place].

## 2022-07-17 ENCOUNTER — Other Ambulatory Visit (HOSPITAL_COMMUNITY): Payer: Self-pay

## 2022-07-17 NOTE — Telephone Encounter (Signed)
Called patient again on 07/17/2022. Will send letter for him to call office back in regards to [facility]. Closing encounter.

## 2022-09-25 ENCOUNTER — Ambulatory Visit (INDEPENDENT_AMBULATORY_CARE_PROVIDER_SITE_OTHER): Payer: No Typology Code available for payment source | Admitting: Pulmonary Disease

## 2022-09-25 ENCOUNTER — Encounter: Payer: Self-pay | Admitting: Pulmonary Disease

## 2022-09-25 VITALS — BP 132/76 | HR 87 | Temp 97.8°F | Ht 74.0 in | Wt 201.0 lb

## 2022-09-25 DIAGNOSIS — J449 Chronic obstructive pulmonary disease, unspecified: Secondary | ICD-10-CM

## 2022-09-25 DIAGNOSIS — R0609 Other forms of dyspnea: Secondary | ICD-10-CM

## 2022-09-25 MED ORDER — BREZTRI AEROSPHERE 160-9-4.8 MCG/ACT IN AERO: 2.0000 | INHALATION_SPRAY | Freq: Two times a day (BID) | RESPIRATORY_TRACT | 6 refills | Status: DC

## 2022-09-25 NOTE — Patient Instructions (Signed)
Nice toNice to see you again  I provided samples of Breztri  Use 2 puffs twice a day - rinse your mouth out with water after every use  You can stop the Stiolto and the Asmanex once you start the [place]  I provided samples today, each inhaler last 7 days.  Please keep an eye on the dose counter  I am going to give you a paper prescription for the breast tree.  If you find that it is more beneficial than your current medicine then you can take it to the [place] or inquire at the [place] as it seemed to be cheaper at the [place]  If it does not make any difference it is okay to resume your Stiolto and Asmanex  Return to clinic in 3 months or sooner as needed with Dr. Silas Flood

## 2022-09-25 NOTE — Progress Notes (Signed)
'@Patient'  ID: Wesley [name], male    DOB: 1956-06-18, 66 y.o.   MRN: [id number]  Chief Complaint  Patient presents with   Follow-up    Follow up for copd. Pt states he is doing ok. Pt wants to get a printed rx for breztri to take to the [place]. Pt states no issues noted since last office visit.     Referring provider: [facility]  HPI:   66 y.o. man whom we are seeing in follow up for evaluation of COPD.    Patient returns for routine follow-up.  Symptoms overall seem stable.  He is on Stiolto and Asmanex at night.  At last visit he was encouraged to increase Asmanex to twice daily given ongoing cough, sputum production.  He did not do this.  The prednisone did seem to help.  He is also prescribed Breztri.  Not on [place] formulary.  It seems we found a solution for him closer to home or here in the community with that would be relatively affordable.  We left messages for him.  He has not started the [place].  He has not picked it up.  He reports albuterol use 1-2 times a week.  With some good effect.  HPI at initial visit: Patient doing well.  Baseline dyspnea stable.  Has COPD for many years.  Was seen in the clinic here for several years.  Then transferred care to the [place].  [place] pulmonologist has also retired now here establish care.  Has baseline dyspnea with stairs and inclines.  No better or worse.  He feels Stiolto is very beneficial in terms of helping his shortness of breath.  He takes Asmanex at night only.  He is not sure how much it helped.  He notes he sleeps well but not sure how much it helps his breathing as he is sleeping when this medication is in the system.  Reviewed most recent cross-sectional image in our EMR 11/20/2021 though my review and interpretation shows no PE, bullous emphysematous changes, no other abnormality.  Reviewed results of most recent CT lung cancer screening 01/17/2022 report lung RADS 2, several stable subcentimeter nodules noted.  PMH: Emphysema,  asthma, GERD Surgical history: Prostate resection, cardiac cath, cystoscopy and stent placement Family history: Mother and father both with cancer, sister with lung cancer Social history: Current smoker, down to 7 cigarettes a day, trying to cut down, down significantly from 1 pack a day, lives in [place] / Pulmonary Flowsheets:   ACT:      No data to display           MMRC:     No data to display           Epworth:     11/18/2016   11:00 AM  Results of the Epworth flowsheet  Sitting and reading 0  Watching TV 2  Sitting, inactive in a public place (e.g. a theatre or a meeting) 1  As a passenger in a car for an hour without a break 0  Lying down to rest in the afternoon when circumstances permit 1  Sitting and talking to someone 0  Sitting quietly after a lunch without alcohol 0  In a car, while stopped for a few minutes in traffic 0  Total score 4    Tests:   FENO:  No results found for: "NITRICOXIDE"  PFT:    Latest Ref Rng & Units 04/17/2016   10:18 AM  PFT Results  FVC-Pre L 3.45   FVC-Predicted Pre % 74   FVC-Post L 3.79   FVC-Predicted Post % 81   Pre FEV1/FVC % % 55   Post FEV1/FCV % % 59   FEV1-Pre L 1.89   FEV1-Predicted Pre % 52   FEV1-Post L 2.24   DLCO uncorrected ml/min/mmHg 17.65   DLCO UNC% % 46   DLVA Predicted % 64   TLC L 6.73   TLC % Predicted % 86   RV % Predicted % 124   Personally reviewed interpreted as moderate fixed obstruction, significant bronchodilator response, lung volumes consistent with air trapping, DLCO severely reduced, DLCO improves corrected for alveolar volume  WALK:     08/26/2018    4:18 PM  SIX MIN WALK  2 Minute Oxygen Saturation % 93 %  2 Minute HR 75  4 Minute Oxygen Saturation % 95 %  4 Minute HR 79  6 Minute Oxygen Saturation % 95 %  6 Minute HR 76    Imaging: Personally reviewed and as per EMR discussion this note   Lab Results: Personally reviewed CBC    Component  Value Date/Time   WBC 5.9 03/04/2022 0059   RBC 4.45 03/04/2022 0059   HGB 12.4 (L) 03/04/2022 0059   HCT 39.0 03/04/2022 0059   HCT TEST REQUEST RECEIVED WITHOUT APPROPRIATE SPECIMEN 05/20/2017 0225   PLT 158 03/04/2022 0059   MCV 87.6 03/04/2022 0059   MCH 27.9 03/04/2022 0059   MCHC 31.8 03/04/2022 0059   RDW 14.7 03/04/2022 0059   LYMPHSABS 2.8 03/03/2022 0004   MONOABS 0.3 03/03/2022 0004   EOSABS 0.2 03/03/2022 0004   BASOSABS 0.0 03/03/2022 0004    BMET    Component Value Date/Time   NA 140 03/04/2022 0059   K 4.0 03/04/2022 0059   CL 112 (H) 03/04/2022 0059   CO2 21 (L) 03/04/2022 0059   GLUCOSE 93 03/04/2022 0059   BUN 13 03/04/2022 0059   CREATININE 0.87 03/04/2022 0059   CREATININE 0.96 03/14/2019 1046   CALCIUM 8.7 (L) 03/04/2022 0059   GFRNONAA >60 03/04/2022 0059   GFRAA >60 09/07/2020 1156    BNP    Component Value Date/Time   BNP 5.2 02/03/2018 1813    ProBNP No results found for: "PROBNP"  Specialty Problems       Pulmonary Problems   COPD mixed type (HCC)   Obstructive sleep apnea   Pneumonia of right middle lobe due to infectious organism   CAP (community acquired pneumonia)    Allergies  Allergen Reactions   Penicillins Swelling    "General swelling" Tolerated cefepime & ceftriaxone previously. Has patient had a PCN reaction causing immediate rash, facial/tongue/throat swelling, SOB or lightheadedness with hypotension: Yes Has patient had a PCN reaction causing severe rash involving mucus membranes or skin necrosis: Unk Has patient had a PCN reaction that required hospitalization: Yes Has patient had a PCN reaction occurring within the last 10 years: No If all of the above answers are "NO", then may proceed with Cephalosporin   Percocet [Oxycodone-Acetaminophen]     Itching   Levaquin [Levofloxacin In D5w] Hives    Immunization History  Administered Date(s) Administered   Fluad Quad(high Dose 65+) 01/17/2022   Influenza Split  10/05/2016   Influenza,inj,Quad PF,6+ Mos 10/04/2015, 08/29/2016, 11/17/2017, 09/08/2019, 08/30/2020   Influenza,inj,quad, With Preservative 03/10/2019   Influenza-Unspecified 09/14/2017   Moderna Covid-19 Vaccine Bivalent Booster 57yr & up 01/17/2022   Moderna Sars-Covid-2 Vaccination 06/12/2021   PFIZER(Purple Top)SARS-COV-2 Vaccination 02/14/2020,  03/06/2020, 09/25/2020   Pneumococcal Conjugate-13 11/17/2016   Pneumococcal Polysaccharide-23 03/30/2017, 04/14/2017   Tdap 11/17/2016, 03/30/2017   Zoster Recombinat (Shingrix) 06/02/2019, 10/07/2019    Past Medical History:  Diagnosis Date   Aneurysm of infrarenal abdominal aorta ([place])    CT 11-23-2018 , 3.2cm   Anxiety    Benign localized prostatic hyperplasia with lower urinary tract symptoms (LUTS)    Cancer (HCC)    kidney cancer   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   CAP (community acquired pneumonia) 11/23/2018   per pt had follow up by pcp at [facility] with CXR done after christmas   Chronic insomnia    COPD with emphysema ([place])    Coronary artery disease    followed by cardiologist @ [facility]---  per cardiac cath 07-11-2015 mild plaquing of the CFx and RCA, normal LVF [facility] [place] copy in epic)   Dementia [facility])    "some per wife"    Depression    Diabetes mellitus without complication ([place])    type 2    Diverticulosis of colon    GERD (gastroesophageal reflux disease)    Glaucoma    Hematuria    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 2001   History of urinary retention    Hyperlipidemia    Hypertension    Incomplete emptying of bladder    Left-sided weakness 08/27/2016   CVA residual    Myocardial infarction [facility]) 2015   Neuromuscular disorder (HCC)    neuropathy feet and hands   OA (osteoarthritis)    knees, feet, wrists, back   Pacemaker    St. Jude   Paroxysmal atrial tachycardia    Polysubstance abuse ([place])    per pt from 2001 to 2016 has had couple of relapses since--  12-31-2018 per pt last relapse with cocaine approx. Oct 2019   Renal mass, left    pelvic   Retinal vein occlusion 01/2016   right eye, secondary to hypertension   S/P placement of cardiac pacemaker 12/11/2014   St Jude dual chamber (followed by [facility])   Seizures [facility])    wife reported last one  2 WEEKS AGO AS OF 07-29-2019   Sepsis ([place]) 02/2019   WENT HOME ON IV MEDS FOR 2  1/2 WEEKS   Shortness of breath    WITH ACTIVITY   Sleep apnea    does not tolerate cpap   SSS (sick sinus syndrome) ([place])    treatment pacemaker placement   Stroke ([place]) 2017 OR 2018   had therapy  slow movement now ( 02/01/2019)  right side weaker   Symptomatic bradycardia    Syncope    Tremor, essential 03/31/2016   Tuberculosis    1990s    Wears glasses    Wears hearing aid in both ears     Tobacco History: Social History   Tobacco Use  Smoking Status Every Day   Packs/day: 0.50   Years: 40.00   Total pack years: 20.00   Types: Cigarettes  Smokeless Tobacco Never  Tobacco Comments   per trying to quit with nicotine patch,  Patty Sermons 07/10/2022 is down  to 7 cigs a day    Ready to quit: Not Answered Counseling given: Not Answered Tobacco comments: per trying to quit with nicotine patch,  Patty Sermons 07/10/2022 is down to 7 cigs a day    Continue to not smoke  Outpatient Encounter Medications as of 09/25/2022  Medication Sig   acetaminophen (TYLENOL) 500 MG tablet Take 1,000 mg by mouth every 6 (six) hours as needed for moderate pain or headache.   albuterol (PROVENTIL HFA;VENTOLIN HFA) 108 (90 Base) MCG/ACT inhaler Inhale 2 puffs into the lungs every 6 (six) hours as needed for wheezing or shortness of  breath.   aspirin EC 325 MG tablet Take 325 mg by mouth daily.   atorvastatin (LIPITOR) 20 MG tablet Take 20 mg by mouth daily.   Blood Glucose Monitoring Suppl (ACCU-CHEK AVIVA PLUS) w/Device KIT 1 Device by Does not apply route 4 (four) times daily.   Brinzolamide-Brimonidine 1-0.2 % SUSP Place 1 drop into both eyes 3 (three) times daily.   Budeson-Glycopyrrol-Formoterol (BREZTRI AEROSPHERE) 160-9-4.8 MCG/ACT AERO Inhale 2 puffs into the lungs in the morning and at bedtime.   carbidopa-levodopa (SINEMET IR) 25-100 MG tablet Take 1 tablet by mouth 3 (three) times daily.   carboxymethylcellulose (REFRESH PLUS) 0.5 % SOLN Place 1 drop into both eyes every 4 (four) hours as needed (for irritation).   cetirizine (ZYRTEC) 10 MG tablet Take 1 tablet (10 mg total) by mouth daily. (Patient taking differently: Take 10 mg by mouth daily as needed for allergies.)   Cholecalciferol (VITAMIN D3) 50 MCG (2000 UT) TABS Take 4,000 Units by mouth daily.   diclofenac sodium (VOLTAREN) 1 % GEL Apply 4 g topically 4 (four) times daily as needed (pain.).    famotidine (PEPCID) 40 MG tablet Take 40 mg by mouth at bedtime.   finasteride (PROSCAR) 5 MG tablet Take 5 mg by mouth daily.    fluticasone (FLONASE) 50 MCG/ACT nasal spray Place 1 spray into both nostrils daily.   gabapentin (NEURONTIN) 400 MG capsule Take 400 mg by mouth 3 (three) times daily.    glucose blood (ACCU-CHEK AVIVA) test strip Use as instructed (Patient taking differently: 4 (four) times daily -  before meals and at bedtime.)   insulin aspart protamine - aspart (NOVOLOG 70/30 MIX) (70-30) 100 UNIT/ML FlexPen Inject 30 Units into the skin 2 (two) times daily with a meal.   latanoprost (XALATAN) 0.005 % ophthalmic solution Place 1 drop into both eyes at bedtime.    levETIRAcetam (KEPPRA) 500 MG tablet Take 1,000 mg by mouth 2 (two) times daily.   metFORMIN (GLUCOPHAGE) 1000 MG tablet Take 1,000 mg by mouth 2 (two) times daily.   methazolamide  (NEPTAZANE) 50 MG tablet Take 1 tablet (50 mg total) by mouth daily.   mirabegron ER (MYRBETRIQ) 50 MG TB24 tablet Take 50 mg by mouth daily.   mirtazapine (REMERON) 15 MG tablet Take 15 mg by mouth at bedtime.   mometasone (ASMANEX) 220 MCG/ACT inhaler Inhale 2 puffs into the lungs daily. At night   Netarsudil Dimesylate 0.02 % SOLN Place 1 drop into both eyes daily.   nicotine (NICODERM CQ - DOSED IN MG/24 HR) 7 mg/24hr patch Place 7 mg onto the skin daily.   nicotine polacrilex (NICORETTE) 2 MG gum Take 2 mg by mouth 3 (three) times daily as needed for smoking cessation.   promethazine (PHENERGAN) 25 MG tablet Take 12.5 mg by mouth 3 (three) times daily as needed for nausea or vomiting.   psyllium (METAMUCIL SMOOTH  TEXTURE) 28 % packet Take 1 packet by mouth daily as needed (for constipation).   Semaglutide (OZEMPIC, 0.25 OR 0.5 MG/DOSE, ) Inject 0.5 mg into the skin every Friday.   senna (SENOKOT) 8.6 MG TABS tablet Take 1 tablet by mouth at bedtime.   sucralfate (CARAFATE) 1 GM/10ML suspension Take 10 mLs (1 g total) by mouth 4 (four) times daily -  with meals and at bedtime. (Patient taking differently: Take 1 g by mouth 4 (four) times daily as needed (stomach pain).)   tamsulosin (FLOMAX) 0.4 MG CAPS capsule Take 1 capsule (0.4 mg total) by mouth daily after supper. (Patient taking differently: Take 0.4 mg by mouth daily.)   Tiotropium Bromide-Olodaterol 2.5-2.5 MCG/ACT AERS Inhale 2 puffs into the lungs daily.    traMADol (ULTRAM) 50 MG tablet Take 1-2 tablets (50-100 mg total) by mouth every 6 (six) hours as needed for moderate pain or severe pain. Post-operatively   venlafaxine XR (EFFEXOR-XR) 75 MG 24 hr capsule Take 225 mg by mouth daily with breakfast.   vitamin B-12 (CYANOCOBALAMIN) 500 MCG tablet Take 500 mcg by mouth daily.   [DISCONTINUED] Budeson-Glycopyrrol-Formoterol (BREZTRI AEROSPHERE) 160-9-4.8 MCG/ACT AERO Inhale 2 puffs into the lungs in the morning and at bedtime.    dicyclomine (BENTYL) 20 MG tablet Take 0.5 tablets (10 mg total) by mouth 2 (two) times daily as needed for up to 5 days for spasms. (Patient not taking: Reported on 03/02/2022)   No facility-administered encounter medications on file as of 09/25/2022.     Review of Systems  Review of Systems  N/a Physical Exam  BP 132/76 (BP Location: Left Arm, Patient Position: Sitting, Cuff Size: Normal)   Pulse 87   Temp 97.8 F (36.6 C) (Oral)   Ht '6\' 2"'  (1.88 m)   Wt 201 lb (91.2 kg)   SpO2 93%   BMI 25.81 kg/m   Wt Readings from Last 5 Encounters:  09/25/22 201 lb (91.2 kg)  07/10/22 201 lb 3.2 oz (91.3 kg)  03/02/22 190 lb 14.7 oz (86.6 kg)  01/29/22 191 lb (86.6 kg)  12/11/21 191 lb 6 oz (86.8 kg)    BMI Readings from Last 5 Encounters:  09/25/22 25.81 kg/m  07/10/22 25.83 kg/m  03/02/22 24.51 kg/m  01/29/22 24.52 kg/m  12/11/21 24.57 kg/m     Physical Exam General: Sitting in chair, no acute distress Eyes: EOMI, icterus Neck: Supple, no JVP Pulmonary: Distant, no wheeze Cardiovascular: Regular rate and rhythm, no murmur Abdomen: Nondistended, bowel sounds present MSK: No synovitis, joint effusion Neuro: Normal gait, no weakness Psych: Normal mood, full affect   Assessment & Plan:   COPD: As demonstrated on prior PFTs.  [place] D.  Historically does not frequently exacerbate but with prolonged and worsening symptoms summer 2023 with exacerbation of.  Currently on Stiolto once daily and using Asmanex only at night.  Recommend Breztri at last visit but he is yet to start this or pick this up.  Not covered by the [place].  Paper prescription provided today.  Samples as well.  If Judithann Sauger is not very helpful then we can stick with his current regimen.  Tobacco abuse: Currently up-to-date with lung cancer screening via the [place], most recent scan 01/17/2022 lung RADS 2.  Recommend continued lung cancer screening.    Return in about 3 months (around 12/26/2022).   Lanier Clam, MD 09/25/2022

## 2022-10-28 ENCOUNTER — Ambulatory Visit (INDEPENDENT_AMBULATORY_CARE_PROVIDER_SITE_OTHER): Payer: No Typology Code available for payment source | Admitting: Pulmonary Disease

## 2022-10-28 DIAGNOSIS — R0609 Other forms of dyspnea: Secondary | ICD-10-CM | POA: Diagnosis not present

## 2022-10-28 LAB — PULMONARY FUNCTION TEST
DL/VA % pred: 67 %
DL/VA: 2.75 ml/min/mmHg/L
DLCO cor % pred: 52 %
DLCO cor: 15.61 ml/min/mmHg
DLCO unc % pred: 52 %
DLCO unc: 15.61 ml/min/mmHg
FEF 25-75 Post: 1.79 L/sec
FEF 25-75 Pre: 1.33 L/sec
FEF2575-%Change-Post: 34 %
FEF2575-%Pred-Post: 58 %
FEF2575-%Pred-Pre: 43 %
FEV1-%Change-Post: 6 %
FEV1-%Pred-Post: 65 %
FEV1-%Pred-Pre: 61 %
FEV1-Post: 2.58 L
FEV1-Pre: 2.43 L
FEV1FVC-%Change-Post: -2 %
FEV1FVC-%Pred-Pre: 90 %
FEV6-%Change-Post: 6 %
FEV6-%Pred-Post: 75 %
FEV6-%Pred-Pre: 70 %
FEV6-Post: 3.78 L
FEV6-Pre: 3.55 L
FEV6FVC-%Change-Post: -2 %
FEV6FVC-%Pred-Post: 101 %
FEV6FVC-%Pred-Pre: 103 %
FVC-%Change-Post: 8 %
FVC-%Pred-Post: 74 %
FVC-%Pred-Pre: 68 %
FVC-Post: 3.91 L
FVC-Pre: 3.59 L
Post FEV1/FVC ratio: 66 %
Post FEV6/FVC ratio: 97 %
Pre FEV1/FVC ratio: 68 %
Pre FEV6/FVC Ratio: 99 %
RV % pred: 100 %
RV: 2.59 L
TLC % pred: 82 %
TLC: 6.42 L

## 2022-10-28 NOTE — Progress Notes (Signed)
Full PFT Performed Today  

## 2022-10-28 NOTE — Patient Instructions (Signed)
Full PFT Performed Today  

## 2023-01-14 ENCOUNTER — Ambulatory Visit (INDEPENDENT_AMBULATORY_CARE_PROVIDER_SITE_OTHER): Payer: No Typology Code available for payment source | Admitting: Pulmonary Disease

## 2023-01-14 ENCOUNTER — Encounter: Payer: Self-pay | Admitting: Pulmonary Disease

## 2023-01-14 VITALS — BP 138/76 | HR 73 | Wt 209.2 lb

## 2023-01-14 DIAGNOSIS — Z122 Encounter for screening for malignant neoplasm of respiratory organs: Secondary | ICD-10-CM | POA: Diagnosis not present

## 2023-01-14 DIAGNOSIS — J441 Chronic obstructive pulmonary disease with (acute) exacerbation: Secondary | ICD-10-CM

## 2023-01-14 MED ORDER — DOXYCYCLINE HYCLATE 100 MG PO TABS: 100.0000 mg | ORAL_TABLET | Freq: Two times a day (BID) | ORAL | 0 refills | Status: DC

## 2023-01-14 MED ORDER — PREDNISONE 20 MG PO TABS: 40.0000 mg | ORAL_TABLET | Freq: Every day | ORAL | 0 refills | Status: AC

## 2023-01-14 MED ORDER — BREZTRI AEROSPHERE 160-9-4.8 MCG/ACT IN AERO: 2.0000 | INHALATION_SPRAY | Freq: Two times a day (BID) | RESPIRATORY_TRACT | 6 refills | Status: DC

## 2023-01-14 NOTE — Progress Notes (Signed)
$'@Patient'K$  ID: Wesley [name], male    DOB: 12-Jun-1956, 67 y.o.   MRN: [id number]  Chief Complaint  Patient presents with   Follow-up    Pt si here for follow up for DOE. Pt states that the Wesley [name] is doing ok for his shortness of breath. Pt still has albuterol for as needed.     Referring provider: [facility]  HPI:   67 y.o. man whom we are seeing in follow up for evaluation of COPD.    Patient returns for routine follow-up.  At last visit, Stiolto was increased to [employer].  He does think this helps a little bit more.  For the last week he had increased cough, shortness of breath.  Productive cough.  Denies any fever, chills, sore throat, sinus congestion, etc.  No sick contacts.    HPI at initial visit: Patient doing well.  Baseline dyspnea stable.  Has COPD for many years.  Was seen in the clinic here for several years.  Then transferred care to the [place].  [place] pulmonologist has also retired now here establish care.  Has baseline dyspnea with stairs and inclines.  No better or worse.  He feels Stiolto is very beneficial in terms of helping his shortness of breath.  He takes Asmanex at night only.  He is not sure how much it helped.  He notes he sleeps well but not sure how much it helps his breathing as he is sleeping when this medication is in the system.  Reviewed most recent cross-sectional image in our EMR 11/20/2021 though my review and interpretation shows no PE, bullous emphysematous changes, no other abnormality.  Reviewed results of most recent CT lung cancer screening 01/17/2022 report lung RADS 2, several stable subcentimeter nodules noted.  PMH: Emphysema, asthma, GERD Surgical history: Prostate resection, cardiac cath, cystoscopy and stent placement Family history: Mother and father both with cancer, sister with lung cancer Social history: Current smoker, down to 7 cigarettes a day, trying to cut down, down significantly from 1 pack a day, lives in  [place] / Pulmonary Flowsheets:   ACT:      No data to display           MMRC:     No data to display           Epworth:     11/18/2016   11:00 AM  Results of the Epworth flowsheet  Sitting and reading 0  Watching TV 2  Sitting, inactive in a public place (e.g. a theatre or a meeting) 1  As a passenger in a car for an hour without a break 0  Lying down to rest in the afternoon when circumstances permit 1  Sitting and talking to someone 0  Sitting quietly after a lunch without alcohol 0  In a car, while stopped for a few minutes in traffic 0  Total score 4    Tests:   FENO:  No results found for: "NITRICOXIDE"  PFT:    Latest Ref Rng & Units 10/28/2022   10:33 AM 04/17/2016   10:18 AM  PFT Results  FVC-Pre L 3.59  3.45   FVC-Predicted Pre % 68  74   FVC-Post L 3.91  3.79   FVC-Predicted Post % 74  81   Pre FEV1/FVC % % 68  55   Post FEV1/FCV % % 66  59   FEV1-Pre L 2.43  1.89   FEV1-Predicted Pre % 61  52  FEV1-Post L 2.58  2.24   DLCO uncorrected ml/min/mmHg 15.61  17.65   DLCO UNC% % 52  46   DLCO corrected ml/min/mmHg 15.61    DLCO COR %Predicted % 52    DLVA Predicted % 67  64   TLC L 6.42  6.73   TLC % Predicted % 82  86   RV % Predicted % 100  124   Personally reviewed interpreted as moderate fixed obstruction, significant bronchodilator response, lung volumes consistent with air trapping, DLCO severely reduced, DLCO improves corrected for alveolar volume  WALK:     08/26/2018    4:18 PM  SIX MIN WALK  2 Minute Oxygen Saturation % 93 %  2 Minute HR 75  4 Minute Oxygen Saturation % 95 %  4 Minute HR 79  6 Minute Oxygen Saturation % 95 %  6 Minute HR 76    Imaging: Personally reviewed and as per EMR discussion this note   Lab Results: Personally reviewed CBC    Component Value Date/Time   WBC 5.9 03/04/2022 0059   RBC 4.45 03/04/2022 0059   HGB 12.4 (L) 03/04/2022 0059   HCT 39.0 03/04/2022 0059   HCT  TEST REQUEST RECEIVED WITHOUT APPROPRIATE SPECIMEN 05/20/2017 0225   PLT 158 03/04/2022 0059   MCV 87.6 03/04/2022 0059   MCH 27.9 03/04/2022 0059   MCHC 31.8 03/04/2022 0059   RDW 14.7 03/04/2022 0059   LYMPHSABS 2.8 03/03/2022 0004   MONOABS 0.3 03/03/2022 0004   EOSABS 0.2 03/03/2022 0004   BASOSABS 0.0 03/03/2022 0004    BMET    Component Value Date/Time   NA 140 03/04/2022 0059   K 4.0 03/04/2022 0059   CL 112 (H) 03/04/2022 0059   CO2 21 (L) 03/04/2022 0059   GLUCOSE 93 03/04/2022 0059   BUN 13 03/04/2022 0059   CREATININE 0.87 03/04/2022 0059   CREATININE 0.96 03/14/2019 1046   CALCIUM 8.7 (L) 03/04/2022 0059   GFRNONAA >60 03/04/2022 0059   GFRAA >60 09/07/2020 1156    BNP    Component Value Date/Time   BNP 5.2 02/03/2018 1813    ProBNP No results found for: "PROBNP"  Specialty Problems       Pulmonary Problems   COPD mixed type ([place])   Obstructive sleep apnea   Pneumonia of right middle lobe due to infectious organism   CAP (community acquired pneumonia)    Allergies  Allergen Reactions   Penicillins Swelling    "General swelling" Tolerated cefepime & ceftriaxone previously. Has patient had a PCN reaction causing immediate rash, facial/tongue/throat swelling, SOB or lightheadedness with hypotension: Yes Has patient had a PCN reaction causing severe rash involving mucus membranes or skin necrosis: Unk Has patient had a PCN reaction that required hospitalization: Yes Has patient had a PCN reaction occurring within the last 10 years: No If all of the above answers are "NO", then may proceed with Cephalosporin   Percocet [Oxycodone-Acetaminophen]     Itching   Levaquin [Levofloxacin In D5w] Hives    Immunization History  Administered Date(s) Administered   Fluad Quad(high Dose 65+) 01/17/2022   Influenza Split 10/05/2016   Influenza,inj,Quad PF,6+ Mos 10/04/2015, 08/29/2016, 11/17/2017, 09/08/2019, 08/30/2020   Influenza,inj,quad, With  Preservative 03/10/2019   Influenza-Unspecified 09/14/2017   Moderna Covid-19 Vaccine Bivalent Booster 48yr & up 01/17/2022   Moderna Sars-Covid-2 Vaccination 06/12/2021   PFIZER(Purple Top)SARS-COV-2 Vaccination 02/14/2020, 03/06/2020, 09/25/2020   Pneumococcal Conjugate-13 11/17/2016   Pneumococcal Polysaccharide-23 03/30/2017, 04/14/2017   Tdap 11/17/2016, 03/30/2017  Zoster Recombinat (Shingrix) 06/02/2019, 10/07/2019    Past Medical History:  Diagnosis Date   Aneurysm of infrarenal abdominal aorta ([place])    CT 11-23-2018 , 3.2cm   Anxiety    Benign localized prostatic hyperplasia with lower urinary tract symptoms (LUTS)    Cancer (HCC)    kidney cancer   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   CAP (community acquired pneumonia) 11/23/2018   per pt had follow up by pcp at [facility] with CXR done after christmas   Chronic insomnia    COPD with emphysema ([place])    Coronary artery disease    followed by cardiologist @ [facility]---  per cardiac cath 07-11-2015 mild plaquing of the CFx and RCA, normal LVF [facility] [place] copy in epic)   Dementia [facility])    "some per wife"    Depression    Diabetes mellitus without complication ([place])    type 2    Diverticulosis of colon    GERD (gastroesophageal reflux disease)    Glaucoma    Hematuria    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 2001   History of urinary retention    Hyperlipidemia    Hypertension    Incomplete emptying of bladder    Left-sided weakness 08/27/2016   CVA residual   Myocardial infarction [facility]) 2015   Neuromuscular disorder (HCC)    neuropathy feet and hands   OA (osteoarthritis)    knees, feet,  wrists, back   Pacemaker    St. Jude   Paroxysmal atrial tachycardia    Polysubstance abuse ([place])    per pt from 2001 to 2016 has had couple of relapses since--  12-31-2018 per pt last relapse with cocaine approx. Oct 2019   Renal mass, left    pelvic   Retinal vein occlusion 01/2016   right eye, secondary to hypertension   S/P placement of cardiac pacemaker 12/11/2014   St Jude dual chamber (followed by [facility])   Seizures [facility])    wife reported last one  2 WEEKS AGO AS OF 07-29-2019   Sepsis ([place]) 02/2019   WENT HOME ON IV MEDS FOR 2  1/2 WEEKS   Shortness of breath    WITH ACTIVITY   Sleep apnea    does not tolerate cpap   SSS (sick sinus syndrome) ([place])    treatment pacemaker placement   Stroke ([place]) 2017 OR 2018   had therapy  slow movement now ( 02/01/2019)  right side weaker   Symptomatic bradycardia    Syncope    Tremor, essential 03/31/2016   Tuberculosis    1990s    Wears glasses    Wears hearing aid in both ears     Tobacco History: Social History   Tobacco Use  Smoking Status Every Day   Packs/day: 0.50   Years: 40.00   Total pack years: 20.00   Types: Cigarettes  Smokeless Tobacco Never  Tobacco Comments   per trying to quit with nicotine patch,  down to 7 cigs a day    Ready to quit: Not Answered Counseling given: Not Answered Tobacco comments: per trying to quit  with nicotine patch,  down to 7 cigs a day    Continue to not smoke  Outpatient Encounter Medications as of 01/14/2023  Medication Sig   acetaminophen (TYLENOL) 500 MG tablet Take 1,000 mg by mouth every 6 (six) hours as needed for moderate pain or headache.   albuterol (PROVENTIL HFA;VENTOLIN HFA) 108 (90 Base) MCG/ACT inhaler Inhale 2 puffs into the lungs every 6 (six) hours as needed for wheezing or shortness of breath.   aspirin EC 325 MG tablet Take 325 mg by mouth daily.   atorvastatin (LIPITOR) 20 MG tablet Take 20 mg by mouth daily.   Blood Glucose Monitoring Suppl  (ACCU-CHEK AVIVA PLUS) w/Device KIT 1 Device by Does not apply route 4 (four) times daily.   Brinzolamide-Brimonidine 1-0.2 % SUSP Place 1 drop into both eyes 3 (three) times daily.   carbidopa-levodopa (SINEMET IR) 25-100 MG tablet Take 1 tablet by mouth 3 (three) times daily.   carboxymethylcellulose (REFRESH PLUS) 0.5 % SOLN Place 1 drop into both eyes every 4 (four) hours as needed (for irritation).   cetirizine (ZYRTEC) 10 MG tablet Take 1 tablet (10 mg total) by mouth daily. (Patient taking differently: Take 10 mg by mouth daily as needed for allergies.)   Cholecalciferol (VITAMIN D3) 50 MCG (2000 UT) TABS Take 4,000 Units by mouth daily.   diclofenac sodium (VOLTAREN) 1 % GEL Apply 4 g topically 4 (four) times daily as needed (pain.).    doxycycline (VIBRA-TABS) 100 MG tablet Take 1 tablet (100 mg total) by mouth 2 (two) times daily.   famotidine (PEPCID) 40 MG tablet Take 40 mg by mouth at bedtime.   finasteride (PROSCAR) 5 MG tablet Take 5 mg by mouth daily.    fluticasone (FLONASE) 50 MCG/ACT nasal spray Place 1 spray into both nostrils daily.   gabapentin (NEURONTIN) 400 MG capsule Take 400 mg by mouth 3 (three) times daily.    glucose blood (ACCU-CHEK AVIVA) test strip Use as instructed (Patient taking differently: 4 (four) times daily -  before meals and at bedtime.)   insulin aspart protamine - aspart (NOVOLOG 70/30 MIX) (70-30) 100 UNIT/ML FlexPen Inject 30 Units into the skin 2 (two) times daily with a meal.   latanoprost (XALATAN) 0.005 % ophthalmic solution Place 1 drop into both eyes at bedtime.    levETIRAcetam (KEPPRA) 500 MG tablet Take 1,000 mg by mouth 2 (two) times daily.   metFORMIN (GLUCOPHAGE) 1000 MG tablet Take 1,000 mg by mouth 2 (two) times daily.   methazolamide (NEPTAZANE) 50 MG tablet Take 1 tablet (50 mg total) by mouth daily.   mirabegron ER (MYRBETRIQ) 50 MG TB24 tablet Take 50 mg by mouth daily.   mirtazapine (REMERON) 15 MG tablet Take 15 mg by mouth at  bedtime.   mometasone (ASMANEX) 220 MCG/ACT inhaler Inhale 2 puffs into the lungs daily. At night   Netarsudil Dimesylate 0.02 % SOLN Place 1 drop into both eyes daily.   nicotine (NICODERM CQ - DOSED IN MG/24 HR) 7 mg/24hr patch Place 7 mg onto the skin daily.   nicotine polacrilex (NICORETTE) 2 MG gum Take 2 mg by mouth 3 (three) times daily as needed for smoking cessation.   predniSONE (DELTASONE) 20 MG tablet Take 2 tablets (40 mg total) by mouth daily with breakfast for 5 days.   promethazine (PHENERGAN) 25 MG tablet Take 12.5 mg by mouth 3 (three) times daily as needed for nausea or vomiting.   psyllium (METAMUCIL SMOOTH TEXTURE) 28 % packet Take 1  packet by mouth daily as needed (for constipation).   Semaglutide (OZEMPIC, 0.25 OR 0.5 MG/DOSE, [place]) Inject 0.5 mg into the skin every Friday.   senna (SENOKOT) 8.6 MG TABS tablet Take 1 tablet by mouth at bedtime.   sucralfate (CARAFATE) 1 GM/10ML suspension Take 10 mLs (1 g total) by mouth 4 (four) times daily -  with meals and at bedtime. (Patient taking differently: Take 1 g by mouth 4 (four) times daily as needed (stomach pain).)   tamsulosin (FLOMAX) 0.4 MG CAPS capsule Take 1 capsule (0.4 mg total) by mouth daily after supper. (Patient taking differently: Take 0.4 mg by mouth daily.)   venlafaxine XR (EFFEXOR-XR) 75 MG 24 hr capsule Take 225 mg by mouth daily with breakfast.   vitamin B-12 (CYANOCOBALAMIN) 500 MCG tablet Take 500 mcg by mouth daily.   [DISCONTINUED] Budeson-Glycopyrrol-Formoterol (BREZTRI AEROSPHERE) 160-9-4.8 MCG/ACT AERO Inhale 2 puffs into the lungs in the morning and at bedtime.   [DISCONTINUED] Tiotropium Bromide-Olodaterol 2.5-2.5 MCG/ACT AERS Inhale 2 puffs into the lungs daily.    Budeson-Glycopyrrol-Formoterol (BREZTRI AEROSPHERE) 160-9-4.8 MCG/ACT AERO Inhale 2 puffs into the lungs in the morning and at bedtime.   dicyclomine (BENTYL) 20 MG tablet Take 0.5 tablets (10 mg total) by mouth 2 (two) times daily as needed  for up to 5 days for spasms. (Patient not taking: Reported on 03/02/2022)   No facility-administered encounter medications on file as of 01/14/2023.     Review of Systems  Review of Systems  N/a Physical Exam  BP 138/76 (BP Location: Left Arm, Patient Position: Sitting, Cuff Size: Normal)   Pulse 73   Wt 209 lb 3.2 oz (94.9 kg)   SpO2 95%   BMI 26.86 kg/m   Wt Readings from Last 5 Encounters:  01/14/23 209 lb 3.2 oz (94.9 kg)  09/25/22 201 lb (91.2 kg)  07/10/22 201 lb 3.2 oz (91.3 kg)  03/02/22 190 lb 14.7 oz (86.6 kg)  01/29/22 191 lb (86.6 kg)    BMI Readings from Last 5 Encounters:  01/14/23 26.86 kg/m  09/25/22 25.81 kg/m  07/10/22 25.83 kg/m  03/02/22 24.51 kg/m  01/29/22 24.52 kg/m     Physical Exam General: Sitting in chair, no acute distress Eyes: EOMI, icterus Neck: Supple, no JVP Pulmonary: Distant, no wheeze Cardiovascular: Regular rate and rhythm, no murmur Abdomen: Nondistended, bowel sounds present MSK: No synovitis, joint effusion Neuro: Normal gait, no weakness Psych: Normal mood, full affect   Assessment & Plan:   COPD with acute exacerbation: As demonstrated on prior PFTs.  Gold D.  Historically does not frequently exacerbate but with prolonged and worsening symptoms summer 2023 with exacerbation.  Continue Breztri.  Thinks it is more beneficial than prior regimen, Asmanex and Stiolto.  New prescription today.  Worsening cough, shortness of breath over the last week.  Lung exam clear.  Suspicious for exacerbation.  Prednisone 40 mg for 5 days as well as doxycycline 100 mg twice daily for 7 days sent to pharmacy.  Tobacco abuse: Currently up-to-date with lung cancer screening via the [place], most recent scan 01/17/2022 lung RADS 2.  Recommend continued lung cancer screening.  After shared decision-making using a decision made, patient agrees to continue on lung cancer screening and prefer to do it here instead of the [place].  New order to continue lung  cancer screening February 2024, CT scan ordered.    Return in about 6 months (around 07/15/2023).   Lanier Clam, MD 01/14/2023

## 2023-01-14 NOTE — Patient Instructions (Signed)
Nice to see you again  For the cough and shortness of breath worse over the last week or so, take prednisone 40 mg for 5 days and doxycycline 100 mg twice a day for 7 days  Continue Breztri, new prescription sent today  I ordered a CT scan for lung cancer screening, your last was in February 2023 and recommended a 1 year follow-up, this should be scheduled for you sometime next month.  Return to clinic in 6 months or sooner as needed with Dr. Silas Flood

## 2023-01-26 ENCOUNTER — Telehealth: Payer: Self-pay | Admitting: Pulmonary Disease

## 2023-01-26 NOTE — Telephone Encounter (Signed)
I spoke to [facility] @ [place] [phone] ext. [phone]. And she states she sent over some forms for Formulary alternative and Criteria for usage w/progress notes on 01/15/23 and 01/22/23 but I don't think we received anything. So, I asked if she can email me what she needed. And I will make sure it goes to the right person to have this taking care of for the pt. She verbalized understanding. Update: received email will leave it in Dr [employer]. Nothing further needed.

## 2023-02-11 ENCOUNTER — Inpatient Hospital Stay: Admission: RE | Admit: 2023-02-11 | Payer: No Typology Code available for payment source | Source: Ambulatory Visit

## 2023-03-26 ENCOUNTER — Ambulatory Visit
Admission: RE | Admit: 2023-03-26 | Discharge: 2023-03-26 | Disposition: A | Discharge status: Home / Self Care | Payer: No Typology Code available for payment source | Source: Ambulatory Visit | Attending: Pulmonary Disease | Admitting: Pulmonary Disease

## 2023-03-26 DIAGNOSIS — Z122 Encounter for screening for malignant neoplasm of respiratory organs: Secondary | ICD-10-CM

## 2023-03-30 ENCOUNTER — Other Ambulatory Visit: Payer: No Typology Code available for payment source

## 2023-03-30 NOTE — Progress Notes (Signed)
CT scan is stable - recommend repeat in 1 year

## 2023-03-31 ENCOUNTER — Other Ambulatory Visit: Payer: Self-pay

## 2023-03-31 DIAGNOSIS — Z716 Tobacco abuse counseling: Secondary | ICD-10-CM

## 2023-05-14 ENCOUNTER — Telehealth: Payer: Self-pay | Admitting: Pulmonary Disease

## 2023-05-14 NOTE — Telephone Encounter (Signed)
Received message from ophthalmologist requesting advice on beta-blocker drops for glaucoma.  I am okay with a trial of timolol drops.  If worsening respiratory symptoms develop we can always reevaluate in the future.  Please fax this message to:  Dr. Hilbert Corrigan [facility] ophthalmology [facility] [address] [place] [address]  Thank you!

## 2023-05-20 NOTE — Telephone Encounter (Signed)
Note has been faxed to Dr. Georgette Dover office.

## 2023-06-04 ENCOUNTER — Other Ambulatory Visit (HOSPITAL_COMMUNITY): Payer: Self-pay | Admitting: Internal Medicine

## 2023-06-04 DIAGNOSIS — R160 Hepatomegaly, not elsewhere classified: Secondary | ICD-10-CM

## 2023-06-10 ENCOUNTER — Ambulatory Visit (HOSPITAL_BASED_OUTPATIENT_CLINIC_OR_DEPARTMENT_OTHER)
Admission: RE | Admit: 2023-06-10 | Discharge: 2023-06-10 | Disposition: A | Discharge status: Home / Self Care | Payer: No Typology Code available for payment source | Source: Ambulatory Visit | Attending: Internal Medicine | Admitting: Internal Medicine

## 2023-06-10 DIAGNOSIS — R160 Hepatomegaly, not elsewhere classified: Secondary | ICD-10-CM | POA: Insufficient documentation

## 2023-06-11 ENCOUNTER — Emergency Department (HOSPITAL_BASED_OUTPATIENT_CLINIC_OR_DEPARTMENT_OTHER): Payer: No Typology Code available for payment source | Admitting: Radiology

## 2023-06-11 ENCOUNTER — Other Ambulatory Visit: Payer: Self-pay

## 2023-06-11 ENCOUNTER — Encounter (HOSPITAL_BASED_OUTPATIENT_CLINIC_OR_DEPARTMENT_OTHER): Payer: Self-pay | Admitting: Emergency Medicine

## 2023-06-11 ENCOUNTER — Emergency Department (HOSPITAL_BASED_OUTPATIENT_CLINIC_OR_DEPARTMENT_OTHER)
Admission: EM | Admit: 2023-06-11 | Discharge: 2023-06-11 | Disposition: A | Discharge status: Home / Self Care | Payer: No Typology Code available for payment source | Attending: Emergency Medicine | Admitting: Emergency Medicine

## 2023-06-11 DIAGNOSIS — Z794 Long term (current) use of insulin: Secondary | ICD-10-CM | POA: Insufficient documentation

## 2023-06-11 DIAGNOSIS — Z7984 Long term (current) use of oral hypoglycemic drugs: Secondary | ICD-10-CM | POA: Diagnosis not present

## 2023-06-11 DIAGNOSIS — K219 Gastro-esophageal reflux disease without esophagitis: Secondary | ICD-10-CM | POA: Diagnosis not present

## 2023-06-11 DIAGNOSIS — Z1152 Encounter for screening for COVID-19: Secondary | ICD-10-CM | POA: Insufficient documentation

## 2023-06-11 DIAGNOSIS — R109 Unspecified abdominal pain: Secondary | ICD-10-CM | POA: Diagnosis present

## 2023-06-11 DIAGNOSIS — Z85528 Personal history of other malignant neoplasm of kidney: Secondary | ICD-10-CM | POA: Insufficient documentation

## 2023-06-11 DIAGNOSIS — K2960 Other gastritis without bleeding: Secondary | ICD-10-CM

## 2023-06-11 DIAGNOSIS — R739 Hyperglycemia, unspecified: Secondary | ICD-10-CM

## 2023-06-11 DIAGNOSIS — R5383 Other fatigue: Secondary | ICD-10-CM

## 2023-06-11 DIAGNOSIS — J449 Chronic obstructive pulmonary disease, unspecified: Secondary | ICD-10-CM | POA: Insufficient documentation

## 2023-06-11 DIAGNOSIS — R0602 Shortness of breath: Secondary | ICD-10-CM | POA: Diagnosis not present

## 2023-06-11 DIAGNOSIS — E1165 Type 2 diabetes mellitus with hyperglycemia: Secondary | ICD-10-CM | POA: Diagnosis not present

## 2023-06-11 DIAGNOSIS — R319 Hematuria, unspecified: Secondary | ICD-10-CM | POA: Insufficient documentation

## 2023-06-11 DIAGNOSIS — Z7982 Long term (current) use of aspirin: Secondary | ICD-10-CM | POA: Insufficient documentation

## 2023-06-11 LAB — LIPASE, BLOOD: Lipase: 50 U/L (ref 11–51)

## 2023-06-11 LAB — URINALYSIS, ROUTINE W REFLEX MICROSCOPIC
Bacteria, UA: NONE SEEN
Bilirubin Urine: NEGATIVE
Glucose, UA: >1000 mg/dL — AB
Hgb urine dipstick: NEGATIVE
Ketones, ur: NEGATIVE mg/dL
Leukocytes,Ua: NEGATIVE
Nitrite: NEGATIVE
Specific Gravity, Urine: 1.03 (ref 1.005–1.030)
pH: 5 (ref 5.0–8.0)

## 2023-06-11 LAB — HEPATIC FUNCTION PANEL
ALT: 23 U/L (ref 0–44)
AST: 15 U/L (ref 15–41)
Albumin: 4.7 g/dL (ref 3.5–5.0)
Alkaline Phosphatase: 186 U/L — ABNORMAL HIGH (ref 38–126)
Bilirubin, Direct: 0.1 mg/dL (ref 0.0–0.2)
Indirect Bilirubin: 0.2 mg/dL — ABNORMAL LOW (ref 0.3–0.9)
Total Bilirubin: 0.3 mg/dL (ref 0.3–1.2)
Total Protein: 7.7 g/dL (ref 6.5–8.1)

## 2023-06-11 LAB — TROPONIN I (HIGH SENSITIVITY)
Troponin I (High Sensitivity): 4 ng/L (ref <18)
Troponin I (High Sensitivity): 4 ng/L (ref <18)

## 2023-06-11 LAB — BASIC METABOLIC PANEL
Anion gap: 9 (ref 5–15)
BUN: 15 mg/dL (ref 8–23)
CO2: 26 mmol/L (ref 22–32)
Calcium: 10.5 mg/dL — ABNORMAL HIGH (ref 8.9–10.3)
Chloride: 106 mmol/L (ref 98–111)
Creatinine, Ser: 0.93 mg/dL (ref 0.61–1.24)
GFR, Estimated: >60 mL/min (ref >60)
Glucose, Bld: 187 mg/dL — ABNORMAL HIGH (ref 70–99)
Potassium: 4.2 mmol/L (ref 3.5–5.1)
Sodium: 141 mmol/L (ref 135–145)

## 2023-06-11 LAB — CBC
HCT: 45.7 % (ref 39.0–52.0)
Hemoglobin: 14.8 g/dL (ref 13.0–17.0)
MCH: 27.8 pg (ref 26.0–34.0)
MCHC: 32.4 g/dL (ref 30.0–36.0)
MCV: 85.9 fL (ref 80.0–100.0)
Platelets: 195 10*3/uL (ref 150–400)
RBC: 5.32 MIL/uL (ref 4.22–5.81)
RDW: 14.7 % (ref 11.5–15.5)
WBC: 6.5 10*3/uL (ref 4.0–10.5)
nRBC: 0 % (ref 0.0–0.2)

## 2023-06-11 LAB — BRAIN NATRIURETIC PEPTIDE: B Natriuretic Peptide: 8.7 pg/mL (ref 0.0–100.0)

## 2023-06-11 LAB — LACTIC ACID, PLASMA: Lactic Acid, Venous: 1.8 mmol/L (ref 0.5–1.9)

## 2023-06-11 LAB — SARS CORONAVIRUS 2 BY RT PCR: SARS Coronavirus 2 by RT PCR: NEGATIVE

## 2023-06-11 MED ORDER — FENTANYL CITRATE PF 50 MCG/ML IJ SOSY
50.0000 ug | PREFILLED_SYRINGE | Freq: Once | INTRAMUSCULAR | Status: AC
  Administered 2023-06-11: 50 ug via INTRAVENOUS
  Filled 2023-06-11: qty 1

## 2023-06-11 MED ORDER — PANTOPRAZOLE SODIUM 40 MG PO TBEC: 40.0000 mg | DELAYED_RELEASE_TABLET | Freq: Every day | ORAL | 0 refills | Status: DC

## 2023-06-11 MED ORDER — PANTOPRAZOLE SODIUM 40 MG IV SOLR
40.0000 mg | Freq: Once | INTRAVENOUS | Status: AC
  Administered 2023-06-11: 40 mg via INTRAVENOUS
  Filled 2023-06-11: qty 10

## 2023-06-11 NOTE — ED Notes (Signed)
When asked , the patient stated he didn't feel the need for a breathing treatment at this time. Breath sounds appear to be clear

## 2023-06-11 NOTE — ED Notes (Signed)
Attempted blood draw x2 and was unsuccessful.

## 2023-06-11 NOTE — ED Provider Notes (Signed)
[place] [facility] AT [facility] Provider Note   CSN: [id number] Arrival date & time: 06/11/23  1815     History  Chief Complaint  Patient presents with   Shortness of Breath    Wesley [name] is a 67 y.o. male.  He has a history of COPD asthma GERD prostate resection kidney cancer cancer diabetes.  He gets most of his care at the [place] although does follow with pulmonology and [facility].  He is complaining of multiple things including fatigue, shortness of breath, pain in both of the sides of his abdomen, reflux, hematuria, swelling on his ankles, elevated blood sugar.  Symptoms have worsened over the last few days.  He denies any known fevers or chills.  No chest pain.  No vomiting.  Has been constipated no diarrhea.  No dysuria.  Had an ultrasound yesterday does not know the results.  The history is provided by the patient and the spouse.  Shortness of Breath Severity:  Moderate Onset quality:  Gradual Duration:  2 days Timing:  Intermittent Progression:  Unchanged Chronicity:  Recurrent Relieved by:  Rest Worsened by:  Activity Ineffective treatments:  None tried Associated symptoms: abdominal pain   Associated symptoms: no chest pain, no cough, no diaphoresis, no fever, no hemoptysis, no sputum production and no vomiting   Risk factors: hx of cancer and tobacco use        Home Medications Prior to Admission medications   Medication Sig Start Date End Date Taking? Authorizing Provider  acetaminophen (TYLENOL) 500 MG tablet Take 1,000 mg by mouth every 6 (six) hours as needed for moderate pain or headache.    [provider]  albuterol (PROVENTIL HFA;VENTOLIN HFA) 108 (90 Base) MCG/ACT inhaler Inhale 2 puffs into the lungs every 6 (six) hours as needed for wheezing or shortness of breath. 04/22/16   Croitoru, Mihai, MD  aspirin EC 325 MG tablet Take 325 mg by mouth daily.    [provider]  atorvastatin (LIPITOR) 20 MG tablet  Take 20 mg by mouth daily.    [provider]  Blood Glucose Monitoring Suppl (ACCU-CHEK AVIVA PLUS) w/Device KIT 1 Device by Does not apply route 4 (four) times daily. 09/02/16   Vivianne Master, PA-C  Brinzolamide-Brimonidine 1-0.2 % SUSP Place 1 drop into both eyes 3 (three) times daily.    [provider]  Budeson-Glycopyrrol-Formoterol (BREZTRI AEROSPHERE) 160-9-4.8 MCG/ACT AERO Inhale 2 puffs into the lungs in the morning and at bedtime. 01/14/23   Hunsucker, Lesia Sago, MD  carbidopa-levodopa (SINEMET IR) 25-100 MG tablet Take 1 tablet by mouth 3 (three) times daily. 09/24/21   [provider]  carboxymethylcellulose (REFRESH PLUS) 0.5 % SOLN Place 1 drop into both eyes every 4 (four) hours as needed (for irritation).    [provider]  cetirizine (ZYRTEC) 10 MG tablet Take 1 tablet (10 mg total) by mouth daily. Patient taking differently: Take 10 mg by mouth daily as needed for allergies. 05/23/17   Regalado, Belkys A, MD  Cholecalciferol (VITAMIN D3) 50 MCG (2000 UT) TABS Take 4,000 Units by mouth daily.    [provider]  diclofenac sodium (VOLTAREN) 1 % GEL Apply 4 g topically 4 (four) times daily as needed (pain.).     [provider]  dicyclomine (BENTYL) 20 MG tablet Take 0.5 tablets (10 mg total) by mouth 2 (two) times daily as needed for up to 5 days for spasms. Patient not taking: Reported on 03/02/2022 10/24/21 12/11/21  Tanda Rockers A, DO  doxycycline (VIBRA-TABS) 100 MG tablet Take 1 tablet (100 mg total) by mouth 2 (two) times daily. 01/14/23   Hunsucker, Lesia Sago, MD  famotidine (PEPCID) 40 MG tablet Take 40 mg by mouth at bedtime.    [provider]  finasteride (PROSCAR) 5 MG tablet Take 5 mg by mouth daily.     [provider]  fluticasone (FLONASE) 50 MCG/ACT nasal spray Place 1 spray into both nostrils daily.    [provider]  gabapentin (NEURONTIN) 400 MG capsule Take 400 mg by mouth 3 (three)  times daily.     [provider]  glucose blood (ACCU-CHEK AVIVA) test strip Use as instructed Patient taking differently: 4 (four) times daily -  before meals and at bedtime. 09/02/16   Vivianne Master, PA-C  insulin aspart protamine - aspart (NOVOLOG 70/30 MIX) (70-30) 100 UNIT/ML FlexPen Inject 30 Units into the skin 2 (two) times daily with a meal.    [provider]  latanoprost (XALATAN) 0.005 % ophthalmic solution Place 1 drop into both eyes at bedtime.     [provider]  levETIRAcetam (KEPPRA) 500 MG tablet Take 1,000 mg by mouth 2 (two) times daily.    [provider]  metFORMIN (GLUCOPHAGE) 1000 MG tablet Take 1,000 mg by mouth 2 (two) times daily.    [provider]  methazolamide (NEPTAZANE) 50 MG tablet Take 1 tablet (50 mg total) by mouth daily. 03/06/22   Pokhrel, Rebekah Chester[place], MD  mirabegron ER (MYRBETRIQ) 50 MG TB24 tablet Take 50 mg by mouth daily.    [provider]  mirtazapine (REMERON) 15 MG tablet Take 15 mg by mouth at bedtime.    [provider]  mometasone [facility]) 220 MCG/ACT inhaler Inhale 2 puffs into the lungs daily. At night    [provider]  Netarsudil Dimesylate 0.02 % SOLN Place 1 drop into both eyes daily.    [provider]  nicotine (NICODERM CQ - DOSED IN MG/24 HR) 7 mg/24hr patch Place 7 mg onto the skin daily.    [provider]  nicotine polacrilex (NICORETTE) 2 MG gum Take 2 mg by mouth 3 (three) times daily as needed for smoking cessation.    [provider]  promethazine (PHENERGAN) 25 MG tablet Take 12.5 mg by mouth 3 (three) times daily as needed for nausea or vomiting. 12/02/21   [provider]  psyllium (METAMUCIL SMOOTH TEXTURE) 28 % packet Take 1 packet by mouth daily as needed (for constipation).    [provider]  Semaglutide (OZEMPIC, 0.25 OR 0.5 MG/DOSE, [place]) Inject 0.5 mg into the skin every Friday.    [provider]   senna (SENOKOT) 8.6 MG TABS tablet Take 1 tablet by mouth at bedtime.    [provider]  sucralfate (CARAFATE) 1 GM/10ML suspension Take 10 mLs (1 g total) by mouth 4 (four) times daily -  with meals and at bedtime. Patient taking differently: Take 1 g by mouth 4 (four) times daily as needed (stomach pain). 11/20/21   Couture, Cortni S, PA-C  tamsulosin (FLOMAX) 0.4 MG CAPS capsule Take 1 capsule (0.4 mg total) by mouth daily after supper. Patient taking differently: Take 0.4 mg by mouth daily. 02/23/18   Burnadette Pop, MD  venlafaxine XR (EFFEXOR-XR) 75 MG 24 hr capsule Take 225 mg by mouth daily with breakfast.    [provider]  vitamin B-12 (CYANOCOBALAMIN) 500 MCG tablet Take 500 mcg by mouth daily.  [provider]      Allergies    Penicillins, Percocet [oxycodone-acetaminophen], and Levaquin [levofloxacin in d5w]    Review of Systems   Review of Systems  Constitutional:  Negative for diaphoresis and fever.  Respiratory:  Positive for shortness of breath. Negative for cough, hemoptysis and sputum production.   Cardiovascular:  Negative for chest pain.  Gastrointestinal:  Positive for abdominal pain and constipation. Negative for vomiting.  Genitourinary:  Positive for hematuria.    Physical Exam Updated Vital Signs BP (!) 129/94 (BP Location: Left Arm)   Pulse 79   Temp (!) 96.9 F (36.1 C)   Resp 20   SpO2 96%  Physical Exam Vitals and nursing note reviewed.  Constitutional:      General: He is not in acute distress.    Appearance: Normal appearance. He is well-developed.  HENT:     Head: Normocephalic and atraumatic.  Eyes:     Conjunctiva/sclera: Conjunctivae normal.  Cardiovascular:     Rate and Rhythm: Normal rate and regular rhythm.     Heart sounds: No murmur heard. Pulmonary:     Effort: Pulmonary effort is normal. No respiratory distress.     Breath sounds: Normal breath sounds.  Abdominal:     Palpations: Abdomen is soft.      Tenderness: There is no abdominal tenderness. There is no guarding or rebound.  Musculoskeletal:        General: No swelling. Normal range of motion.     Cervical back: Neck supple.     Right lower leg: No tenderness.     Left lower leg: No tenderness.  Skin:    General: Skin is warm and dry.     Capillary Refill: Capillary refill takes less than 2 seconds.  Neurological:     General: No focal deficit present.     Mental Status: He is alert.     Motor: No weakness.     ED Results / Procedures / Treatments   Labs (all labs ordered are listed, but only abnormal results are displayed) Labs Reviewed  BASIC METABOLIC PANEL - Abnormal; Notable for the following components:      Result Value   Glucose, Bld 187 (*)    Calcium 10.5 (*)    All other components within normal limits  HEPATIC FUNCTION PANEL - Abnormal; Notable for the following components:   Alkaline Phosphatase 186 (*)    Indirect Bilirubin 0.2 (*)    All other components within normal limits  URINALYSIS, ROUTINE W REFLEX MICROSCOPIC - Abnormal; Notable for the following components:   Glucose, UA >1,000 (*)    Protein, ur TRACE (*)    All other components within normal limits  SARS CORONAVIRUS 2 BY RT PCR  CULTURE, BLOOD (ROUTINE X 2)  CBC  LIPASE, BLOOD  BRAIN NATRIURETIC PEPTIDE  LACTIC ACID, PLASMA  TROPONIN I (HIGH SENSITIVITY)  TROPONIN I (HIGH SENSITIVITY)    EKG EKG Interpretation Date/Time:  Thursday June 11 2023 18:50:35 EDT Ventricular Rate:  76 PR Interval:  157 QRS Duration:  80 QT Interval:  381 QTC Calculation: 429 R Axis:   72  Text Interpretation: Sinus rhythm Borderline ST elevation, inferior leads No significant change since prior 3/23 Confirmed by Meridee Score [phone]) on 06/11/2023 6:59:39 PM Also confirmed by Meridee Score [phone]), editor Erenest Rasher ([id number])  on 06/12/2023 9:02:16 AM  Radiology DG Chest 2 View  Result Date: 06/11/2023 CLINICAL DATA:  Shortness of breath,  COPD. EXAM: CHEST - 2 VIEW  COMPARISON:  March 02, 2022. FINDINGS: The heart size and mediastinal contours are within normal limits. 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 Stable emphysematous disease is seen involving the right upper lobe. No acute pulmonary abnormality is noted. The visualized skeletal structures are unremarkable. IMPRESSION: Stable emphysematous disease seen involving right upper lobe. No acute cardiopulmonary abnormality seen. Emphysema (ICD10-J43.9). Electronically Signed   By: Lupita Raider M.D.   On: 06/11/2023 18:47   US Abdomen Limited RUQ (LIVER/GB)  Result Date: 06/10/2023 CLINICAL DATA:  Suspected hepatomegaly EXAM: ULTRASOUND ABDOMEN LIMITED RIGHT UPPER QUADRANT COMPARISON:  CT of the abdomen and pelvis December 26, 2022 FINDINGS: Gallbladder: No gallstones or wall thickening visualized. No sonographic Murphy sign noted by sonographer. Common bile duct: Diameter: 3.7 mm Liver: Increased heterogeneous echogenicity. No measurements were obtained of the liver. Portal vein is patent on color Doppler imaging with normal direction of blood flow towards the liver. Other: None. IMPRESSION: 1. Known hepatic steatosis. 2. The liver was not measured. As a result, this study does not terminate in the presence or absence of hepatomegaly. 3. No other abnormalities. Electronically Signed   By: Gerome Sam III M.D.   On: 06/10/2023 16:56    Procedures Procedures    Medications Ordered in ED Medications  pantoprazole (PROTONIX) injection 40 mg (40 mg Intravenous Given 06/11/23 1945)  fentaNYL (SUBLIMAZE) injection 50 mcg (50 mcg Intravenous Given 06/11/23 1941)    ED Course/ Medical Decision Making/ A&P Clinical Course as of 06/12/23 0938  Thu Jun 11, 2023  1859 EKG not crossing in epic normal sinus rhythm no acute ST-T changes rate of 76.  Similar to prior [MB]  2300 Viewed results of workup with patient and his wife.  She said he would rather go home and follow-up with  his team. [MB]    Clinical Course User Index [MB] Terrilee Files, MD                             Medical Decision Making Amount and/or Complexity of Data Reviewed Labs: ordered. Radiology: ordered.  Risk Prescription drug management.   This patient complains of multiple things including nausea and reflux, chest pain and shortness of breath,; this involves an extensive number of treatment Options and is a complaint that carries with it a high risk of complications and morbidity. The differential includes viral syndrome, COVID, pneumonia, renal colic, infection, dehydration, metabolic derangement  I ordered, reviewed and interpreted labs, which included CBC normal chemistries normal other than elevated glucose, LFTs normal other than elevated alk phos, urinalysis without signs of infection, BNP normal, troponin normal, lactate normal, COVID-negative I ordered medication IV PPI and fentanyl and reviewed PMP when indicated. I ordered imaging studies which included chest x-ray and I independently    visualized and interpreted imaging which showed no acute findings Additional history obtained from patient's wife Previous records obtained and reviewed in epic, most of his visits are at the [place] and unable to be reviewed Cardiac monitoring reviewed, normal sinus rhythm Social determinants considered, ongoing tobacco use Critical Interventions: None  After the interventions stated above, I reevaluated the patient and found him to be symptomatically improved and hemodynamically stable Admission and further testing considered, no indications for admission at this time.  Reviewed workup with him and his wife.  [facility].  Will add PPI as I do not see 1 on his list.  Return instructions discussed.  Final Clinical Impression(s) / ED Diagnoses Final diagnoses:  Hyperglycemia  Reflux gastritis  Fatigue, unspecified type  Hematuria,  unspecified type    Rx / DC Orders ED Discharge Orders          Ordered    pantoprazole (PROTONIX) 40 MG tablet  Daily        06/11/23 2304              Terrilee Files, MD 06/12/23 [phone]

## 2023-06-11 NOTE — ED Notes (Signed)
All appropriate discharge materials reviewed at length with patient. Time for questions provided. Pt has no other questions at this time and verbalizes understanding of all provided materials.  

## 2023-06-11 NOTE — Discharge Instructions (Signed)
You were seen in the emergency department for various symptoms including fatigue reflux elevated blood sugar blood in your urine pain in your sides.  You had blood work EKG chest x-ray urinalysis that did not show an obvious explanation for your symptoms.  We are starting you on some acid medication.  It will be important for you to follow-up with your primary care team at the [facility].  Return to the emergency department if any worsening or concerning symptoms.

## 2023-06-11 NOTE — ED Triage Notes (Signed)
Pt presents with complaint of shortness of breath, weakness, pain on right side, and acid reflux as well as blood in his urine and lower extremity swelling.  Hx of COPD and emphysema.  Symptoms have been going on for 2 days. No recorded fevers.  Worsening shob with exertion.

## 2023-06-13 LAB — CULTURE, BLOOD (ROUTINE X 2)

## 2023-06-14 LAB — CULTURE, BLOOD (ROUTINE X 2): Culture: NO GROWTH

## 2023-06-16 LAB — CULTURE, BLOOD (ROUTINE X 2)

## 2023-08-03 ENCOUNTER — Encounter: Payer: Self-pay | Admitting: Adult Health

## 2023-08-03 ENCOUNTER — Ambulatory Visit (INDEPENDENT_AMBULATORY_CARE_PROVIDER_SITE_OTHER): Payer: No Typology Code available for payment source | Admitting: Adult Health

## 2023-08-03 VITALS — BP 140/78 | HR 98 | Temp 97.8°F | Ht 74.0 in | Wt 223.8 lb

## 2023-08-03 DIAGNOSIS — J449 Chronic obstructive pulmonary disease, unspecified: Secondary | ICD-10-CM

## 2023-08-03 DIAGNOSIS — Z01811 Encounter for preprocedural respiratory examination: Secondary | ICD-10-CM

## 2023-08-03 DIAGNOSIS — F17200 Nicotine dependence, unspecified, uncomplicated: Secondary | ICD-10-CM | POA: Diagnosis not present

## 2023-08-03 NOTE — Patient Instructions (Addendum)
Continue on Breztri 2 puffs Twice daily, rinse after use.  Albuterol inhaler As needed   Activity as tolerated.  Work on not smoking  Flu shot this fall .  Continue with Yearly CT chest screening.  Follow up with Dr. Judeth Horn in 6 months and As needed

## 2023-08-03 NOTE — Progress Notes (Unsigned)
@Patient  ID: Wesley [name], male    DOB: 1956-12-11, 67 y.o.   MRN: [id number]  Chief Complaint  Patient presents with   surgical clearance    Referring provider: [facility], [facility]  HPI: 67 year old male  smoker followed for COPD Participates in the lung cancer CT screening program  TEST/EVENTS :  CT chest March 26, 2023 lung RADS 2-bullous changes of emphysema, dominant bullae in the left upper lobe, small scattered lung nodules largest measuring 5.3 mm.   PFT 10/2022 FEV1 65%, ratio 66, FVC 74%, DLCO 52%  08/03/2023 Follow up : COPD, Lung nodules  Patient returns for a 3-month follow-up.  Patient has underlying COPD with bullous emphysema.  He is maintained on Breztri.  Says overall he is doing well on Breztri.  Denies any flare of his cough or wheezing.  Does get winded with heavy activities.  Previous PFT showed moderate airflow obstruction.  Patient participates in the lung cancer CT chest screening program.  Most recent CT chest April 2024 showed stable bullous emphysema, left upper lobe bullae, scattered small nodules.  Has a plan screening CT in April 2025. Patient says he has got upcoming wrist surgery.  Needs a preop pulmonary risk assessment Patient remains independent.  Lives at home.  Is able to drive.  And is able to do his light house chores.  Patient does continue smoke we discussed smoking cessation.  Also discussed flu shot for this fall. Patient is not on Oxygen.  Patient says he has had surgery previously and had no known difficulties with anesthesia  Allergies  Allergen Reactions   Penicillins Swelling    "General swelling" Tolerated cefepime & ceftriaxone previously. Has patient had a PCN reaction causing immediate rash, facial/tongue/throat swelling, SOB or lightheadedness with hypotension: Yes Has patient had a PCN reaction causing severe rash involving mucus membranes or skin necrosis: Unk Has patient had a PCN reaction that required hospitalization:  Yes Has patient had a PCN reaction occurring within the last 10 years: No If all of the above answers are "NO", then may proceed with Cephalosporin   Percocet [Oxycodone-Acetaminophen]     Itching   Levaquin [Levofloxacin In D5w] Hives    Immunization History  Administered Date(s) Administered   COVID-19, mRNA, vaccine(Comirnaty)12 years and older 01/19/2023   Fluad Quad(high Dose 65+) 01/17/2022   Influenza Split 10/05/2016   Influenza, High Dose Seasonal PF 01/19/2023   Influenza,inj,Quad PF,6+ Mos 10/04/2015, 08/29/2016, 11/17/2017, 09/08/2019, 08/30/2020   Influenza,inj,quad, With Preservative 03/10/2019   Influenza-Unspecified 09/14/2017   Moderna Covid-19 Vaccine Bivalent Booster 66yrs & up 01/17/2022   Moderna Sars-Covid-2 Vaccination 06/12/2021   PFIZER(Purple Top)SARS-COV-2 Vaccination 02/14/2020, 03/06/2020, 09/25/2020   Pneumococcal Conjugate-13 11/17/2016   Pneumococcal Polysaccharide-23 03/30/2017, 04/14/2017   Rsv, Bivalent, Protein Subunit Rsvpref,pf Verdis Frederickson) 05/26/2023   Tdap 11/17/2016, 03/30/2017   Zoster Recombinant(Shingrix) 06/02/2019, 10/07/2019    Past Medical History:  Diagnosis Date   Aneurysm of infrarenal abdominal aorta (HCC)    CT 11-23-2018 , 3.2cm   Anxiety    Benign localized prostatic hyperplasia with lower urinary tract symptoms (LUTS)    Cancer (HCC)    kidney cancer   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   CAP (community acquired pneumonia) 11/23/2018   per pt had follow up by pcp at [facility] with CXR done after christmas   Chronic insomnia    COPD with emphysema (HCC)    Coronary artery disease    followed by cardiologist @ [facility]---  per cardiac  cath 07-11-2015 mild plaquing of the CFx and RCA, normal LVF [facility] [place] copy in epic)   Dementia [facility])    "some per wife"    Depression    Diabetes mellitus without complication (HCC)    type 2    Diverticulosis of colon    GERD (gastroesophageal reflux disease)     Glaucoma    Hematuria    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 2001   History of urinary retention    Hyperlipidemia    Hypertension    Incomplete emptying of bladder    Left-sided weakness 08/27/2016   CVA residual   Myocardial infarction [facility]) 2015   Neuromuscular disorder (HCC)    neuropathy feet and hands   OA (osteoarthritis)    knees, feet, wrists, back   Pacemaker    St. Jude   Paroxysmal atrial tachycardia    Polysubstance abuse (HCC)    per pt from 2001 to 2016 has had couple of relapses since--  12-31-2018 per pt last relapse with cocaine approx. Oct 2019   Renal mass, left    pelvic   Retinal vein occlusion 01/2016   right eye, secondary to hypertension   S/P placement of cardiac pacemaker 12/11/2014   St Jude dual chamber (followed by [facility])   Seizures [facility])    wife reported last one  2 WEEKS AGO AS OF 07-29-2019   Sepsis (HCC) 02/2019   WENT HOME ON IV MEDS FOR 2  1/2 WEEKS   Shortness of breath    WITH ACTIVITY   Sleep apnea    does not tolerate cpap   SSS (sick sinus syndrome) (HCC)    treatment pacemaker placement   Stroke (HCC) 2017 OR 2018   had therapy  slow movement now ( 02/01/2019)  right side weaker   Symptomatic bradycardia    Syncope    Tremor, essential 03/31/2016   Tuberculosis    1990s    Wears glasses    Wears hearing aid in both ears     Tobacco History: Social History   Tobacco Use  Smoking Status Every Day   Current packs/day: 0.50   Average packs/day: 0.5 packs/day for 40.0 years (20.0 ttl pk-yrs)   Types: Cigarettes  Smokeless Tobacco Never  Tobacco Comments   per trying to quit with nicotine patch every now and  then.  down to 3 cigs a day.  08/03/2023 hfb   Ready to quit: Not Answered Counseling given: Not Answered Tobacco comments: per trying to quit with nicotine patch every now and then.  down to 3 cigs a day.  08/03/2023 hfb   Outpatient Medications Prior to Visit  Medication Sig Dispense Refill   acetaminophen (TYLENOL) 500 MG tablet Take 1,000 mg by mouth every 6 (six) hours as needed for moderate pain or headache.     albuterol (PROVENTIL HFA;VENTOLIN HFA) 108 (90 Base) MCG/ACT inhaler Inhale 2 puffs into the lungs every 6 (six) hours as needed for wheezing or shortness of breath. 1 Inhaler 2   aspirin EC 325 MG tablet Take 325 mg by mouth daily.     atorvastatin (LIPITOR) 20 MG  tablet Take 20 mg by mouth daily.     Blood Glucose Monitoring Suppl (ACCU-CHEK AVIVA PLUS) w/Device KIT 1 Device by Does not apply route 4 (four) times daily. 1 kit 0   Brinzolamide-Brimonidine 1-0.2 % SUSP Place 1 drop into both eyes 3 (three) times daily.     Budeson-Glycopyrrol-Formoterol (BREZTRI AEROSPHERE) 160-9-4.8 MCG/ACT AERO Inhale 2 puffs into the lungs in the morning and at bedtime. 1 each 6   carbidopa-levodopa (SINEMET IR) 25-100 MG tablet Take 1 tablet by mouth 3 (three) times daily.     carboxymethylcellulose (REFRESH PLUS) 0.5 % SOLN Place 1 drop into both eyes every 4 (four) hours as needed (for irritation).     cetirizine (ZYRTEC) 10 MG tablet Take 1 tablet (10 mg total) by mouth daily. (Patient taking differently: Take 10 mg by mouth daily as needed for allergies.) 30 tablet 1   Cholecalciferol (VITAMIN D3) 50 MCG (2000 UT) TABS Take 4,000 Units by mouth daily.     diclofenac sodium (VOLTAREN) 1 % GEL Apply 4 g topically 4 (four) times daily as needed (pain.).      doxycycline (VIBRA-TABS) 100 MG tablet Take 1 tablet (100 mg total) by mouth 2 (two) times daily. 14 tablet 0   famotidine (PEPCID) 40 MG tablet Take 40 mg by mouth at bedtime.     finasteride (PROSCAR) 5 MG tablet Take 5 mg by mouth  daily.      fluticasone (FLONASE) 50 MCG/ACT nasal spray Place 1 spray into both nostrils daily.     gabapentin (NEURONTIN) 400 MG capsule Take 400 mg by mouth 3 (three) times daily.      glucose blood (ACCU-CHEK AVIVA) test strip Use as instructed (Patient taking differently: 4 (four) times daily -  before meals and at bedtime.) 100 each 12   insulin aspart protamine - aspart (NOVOLOG 70/30 MIX) (70-30) 100 UNIT/ML FlexPen Inject 30 Units into the skin 2 (two) times daily with a meal.     latanoprost (XALATAN) 0.005 % ophthalmic solution Place 1 drop into both eyes at bedtime.      levETIRAcetam (KEPPRA) 500 MG tablet Take 1,000 mg by mouth 2 (two) times daily.     metFORMIN (GLUCOPHAGE) 1000 MG tablet Take 1,000 mg by mouth 2 (two) times daily.     methazolamide (NEPTAZANE) 50 MG tablet Take 1 tablet (50 mg total) by mouth daily.     mirabegron ER (MYRBETRIQ) 50 MG TB24 tablet Take 50 mg by mouth daily.     mirtazapine (REMERON) 15 MG tablet Take 15 mg by mouth at bedtime.     mometasone (ASMANEX) 220 MCG/ACT inhaler Inhale 2 puffs into the lungs daily. At night     Netarsudil Dimesylate 0.02 % SOLN Place 1 drop into both eyes daily.     nicotine (NICODERM CQ - DOSED IN MG/24 HR) 7 mg/24hr patch Place 7 mg onto the skin daily.     nicotine polacrilex (NICORETTE) 2 MG gum Take 2 mg by mouth 3 (three) times daily as needed for smoking cessation.     pantoprazole (PROTONIX) 40 MG tablet Take 1 tablet (40 mg total) by mouth daily. 30 tablet 0   promethazine (PHENERGAN) 25 MG tablet Take 12.5 mg by mouth 3 (three) times daily as needed for nausea or vomiting.     psyllium (METAMUCIL SMOOTH TEXTURE) 28 % packet Take 1 packet by mouth daily as needed (for constipation).     Semaglutide (OZEMPIC, 0.25 OR 0.5 MG/DOSE, [place]) Inject 0.5 mg  into the skin every Friday.     senna (SENOKOT) 8.6 MG TABS tablet Take 1 tablet by mouth at bedtime.     sucralfate (CARAFATE) 1 GM/10ML suspension Take 10 mLs (1 g  total) by mouth 4 (four) times daily -  with meals and at bedtime. (Patient taking differently: Take 1 g by mouth 4 (four) times daily as needed (stomach pain).) 420 mL 0   tamsulosin (FLOMAX) 0.4 MG CAPS capsule Take 1 capsule (0.4 mg total) by mouth daily after supper. (Patient taking differently: Take 0.4 mg by mouth daily.) 30 capsule 0   venlafaxine XR (EFFEXOR-XR) 75 MG 24 hr capsule Take 225 mg by mouth daily with breakfast.     vitamin B-12 (CYANOCOBALAMIN) 500 MCG tablet Take 500 mcg by mouth daily.     dicyclomine (BENTYL) 20 MG tablet Take 0.5 tablets (10 mg total) by mouth 2 (two) times daily as needed for up to 5 days for spasms. (Patient not taking: Reported on 03/02/2022) 10 tablet 0   No facility-administered medications prior to visit.     Review of Systems:   Constitutional:   No  weight loss, night sweats,  Fevers, chills,  +fatigue, or  lassitude.  HEENT:   No headaches,  Difficulty swallowing,  Tooth/dental problems, or  Sore throat,                No sneezing, itching, ear ache, nasal congestion, post nasal drip,   CV:  No chest pain,  Orthopnea, PND, swelling in lower extremities, anasarca, dizziness, palpitations, syncope.   GI  No heartburn, indigestion, abdominal pain, nausea, vomiting, diarrhea, change in bowel habits, loss of appetite, bloody stools.   Resp:  No chest wall deformity  Skin: no rash or lesions.  GU: no dysuria, change in color of urine, no urgency or frequency.  No flank pain, no hematuria   MS:  No joint pain or swelling.  No decreased range of motion.  No back pain.    Physical Exam  BP (!) 140/78 (BP Location: Left Arm, Patient Position: Sitting, Cuff Size: Normal)   Pulse 98   Temp 97.8 F (36.6 C) (Oral)   Ht 6\' 2"  (1.88 m)   Wt 223 lb 12.8 oz (101.5 kg)   SpO2 96%   BMI 28.73 kg/m   GEN: A/Ox3; pleasant , NAD, well nourished    HEENT:  [place]/AT,  NOSE-clear, THROAT-clear, no lesions, no postnasal drip or exudate noted.    NECK:  Supple w/ fair ROM; no JVD; normal carotid impulses w/o bruits; no thyromegaly or nodules palpated; no lymphadenopathy.    RESP  Clear  P & A; w/o, wheezes/ rales/ or rhonchi. no accessory muscle use, no dullness to percussion  CARD:  RRR, no m/r/g, no peripheral edema, pulses intact, no cyanosis or clubbing.  GI:   Soft & nt; nml bowel sounds; no organomegaly or masses detected.   Musco: Warm bil, no deformities or joint swelling noted.   Neuro: alert, no focal deficits noted.    Skin: Warm, no lesions or rashes    Lab Results:    BMET   BNP   ProBNP No results found for: "PROBNP"  Imaging: No results found.  Administration History     None          Latest Ref Rng & Units 10/28/2022   10:33 AM 04/17/2016   10:18 AM  PFT Results  FVC-Pre L 3.59  3.45   FVC-Predicted Pre % 68  74  FVC-Post L 3.91  3.79   FVC-Predicted Post % 74  81   Pre FEV1/FVC % % 68  55   Post FEV1/FCV % % 66  59   FEV1-Pre L 2.43  1.89   FEV1-Predicted Pre % 61  52   FEV1-Post L 2.58  2.24   DLCO uncorrected ml/min/mmHg 15.61  17.65   DLCO UNC% % 52  46   DLCO corrected ml/min/mmHg 15.61    DLCO COR %Predicted % 52    DLVA Predicted % 67  64   TLC L 6.42  6.73   TLC % Predicted % 82  86   RV % Predicted % 100  124     No results found for: "NITRICOXIDE"      Assessment & Plan:   No problem-specific Assessment & Plan notes found for this encounter.     Rubye Oaks, NP 08/03/2023

## 2023-08-04 DIAGNOSIS — Z01811 Encounter for preprocedural respiratory examination: Secondary | ICD-10-CM | POA: Insufficient documentation

## 2023-08-04 NOTE — Assessment & Plan Note (Signed)
Moderate COPD-well compensated on Breztri.  Appears to be at baseline.  No flare in cough or wheezing.  Patient is encouraged on smoking cessation.  Activity as tolerated. Continue with the lung cancer CT screening program  Plan  Patient Instructions  Continue on Breztri 2 puffs Twice daily, rinse after use.  Albuterol inhaler As needed   Activity as tolerated.  Work on not smoking  Flu shot this fall .  Continue with Yearly CT chest screening.  Follow up with Dr. Judeth Horn in 6 months and As needed

## 2023-08-04 NOTE — Assessment & Plan Note (Signed)
Pulmonary preop risk assessment.-Patient has underlying moderate COPD.  Appears to be at baseline.  Seems to be well-controlled on current maintenance regimen with triple therapy inhaler.  Patient does continue to smoke.  Is fully independent lives at home.  Has not been on oxygen.  Patient is a mild to moderate surgical risk from a pulmonary standpoint.  Due to underlying COPD and age.  We went over potential pulmonary risk:   Major Pulmonary risks identified in the multifactorial risk analysis are but not limited to a) pneumonia; b) recurrent intubation risk; c) prolonged or recurrent acute respiratory failure needing mechanical ventilation; d) prolonged hospitalization; e) DVT/Pulmonary embolism; f) Acute Pulmonary edema  Recommend 1. Short duration of surgery as much as possible and avoid paralytic if possible . Recovery in step down or ICU with Pulmonary consultation if indicated  . Aggressive pulmonary toilet with o2, bronchodilatation, and incentive spirometry and early ambulation

## 2023-08-04 NOTE — Assessment & Plan Note (Signed)
Smoking cessation discussed.  Continue with the lung cancer CT screening program yearly.

## 2023-08-06 ENCOUNTER — Telehealth: Payer: Self-pay | Admitting: *Deleted

## 2023-08-06 NOTE — Telephone Encounter (Signed)
Initial attempt to fax surgical clearance failed d/t line being busy [phone]). Attempted to fax to alternate # [phone], there was no answer at this number. Attempted [phone] again

## 2023-08-06 NOTE — Telephone Encounter (Signed)
Surgical clearance faxed to [facility], Dr. Manson Passey [phone].  Received confirmation that fax was sent successfully. Nothing further needed.

## 2023-08-19 ENCOUNTER — Emergency Department (HOSPITAL_BASED_OUTPATIENT_CLINIC_OR_DEPARTMENT_OTHER)
Admission: EM | Admit: 2023-08-19 | Discharge: 2023-08-19 | Disposition: A | Discharge status: Home / Self Care | Payer: No Typology Code available for payment source | Attending: Emergency Medicine | Admitting: Emergency Medicine

## 2023-08-19 ENCOUNTER — Encounter (HOSPITAL_BASED_OUTPATIENT_CLINIC_OR_DEPARTMENT_OTHER): Payer: Self-pay

## 2023-08-19 ENCOUNTER — Other Ambulatory Visit: Payer: Self-pay

## 2023-08-19 DIAGNOSIS — U071 COVID-19: Secondary | ICD-10-CM | POA: Insufficient documentation

## 2023-08-19 DIAGNOSIS — Z7982 Long term (current) use of aspirin: Secondary | ICD-10-CM | POA: Diagnosis not present

## 2023-08-19 DIAGNOSIS — R030 Elevated blood-pressure reading, without diagnosis of hypertension: Secondary | ICD-10-CM | POA: Diagnosis not present

## 2023-08-19 DIAGNOSIS — R059 Cough, unspecified: Secondary | ICD-10-CM | POA: Diagnosis present

## 2023-08-19 LAB — RESP PANEL BY RT-PCR (RSV, FLU A&B, COVID)  RVPGX2
Influenza A by PCR: NEGATIVE
Influenza B by PCR: NEGATIVE
Resp Syncytial Virus by PCR: NEGATIVE
SARS Coronavirus 2 by RT PCR: POSITIVE — AB

## 2023-08-19 MED ORDER — ACETAMINOPHEN 500 MG PO TABS
1000.0000 mg | ORAL_TABLET | Freq: Once | ORAL | Status: AC
  Administered 2023-08-19: 1000 mg via ORAL
  Filled 2023-08-19: qty 2

## 2023-08-19 MED ORDER — IBUPROFEN 400 MG PO TABS
400.0000 mg | ORAL_TABLET | Freq: Once | ORAL | Status: AC
  Administered 2023-08-19: 400 mg via ORAL
  Filled 2023-08-19: qty 1

## 2023-08-19 NOTE — ED Provider Notes (Signed)
[place] EMERGENCY DEPARTMENT AT [facility] Provider Note   CSN: [id number] Arrival date & time: 08/19/23  2039     History  Chief Complaint  Patient presents with   Generalized Body Aches    Wesley [name] is a 67 y.o. male.  Pt c/o general body aches, non prod cough, runny nose, mild sore throat, subj fever in past 2-3 days. No specific known ill contacts or known covid exposure. Has been vaccinated for covid. No severe headaches. No neck pain or stiffness. No chest pain or sob. No abd pain or nv. No dysuria. No focal extremity pain or swelling.   The history is provided by the patient, a relative and medical records.       Home Medications Prior to Admission medications   Medication Sig Start Date End Date Taking? Authorizing Provider  acetaminophen (TYLENOL) 500 MG tablet Take 1,000 mg by mouth every 6 (six) hours as needed for moderate pain or headache.    [provider]  albuterol (PROVENTIL HFA;VENTOLIN HFA) 108 (90 Base) MCG/ACT inhaler Inhale 2 puffs into the lungs every 6 (six) hours as needed for wheezing or shortness of breath. 04/22/16   Croitoru, Mihai, MD  aspirin EC 325 MG tablet Take 325 mg by mouth daily.    [provider]  atorvastatin (LIPITOR) 20 MG tablet Take 20 mg by mouth daily.    [provider]  Blood Glucose Monitoring Suppl (ACCU-CHEK AVIVA PLUS) w/Device KIT 1 Device by Does not apply route 4 (four) times daily. 09/02/16   Vivianne Master, PA-C  Brinzolamide-Brimonidine 1-0.2 % SUSP Place 1 drop into both eyes 3 (three) times daily.    [provider]  Budeson-Glycopyrrol-Formoterol (BREZTRI AEROSPHERE) 160-9-4.8 MCG/ACT AERO Inhale 2 puffs into the lungs in the morning and at bedtime. 01/14/23   Hunsucker, Lesia Sago, MD  carbidopa-levodopa (SINEMET IR) 25-100 MG tablet Take 1 tablet by mouth 3 (three) times daily. 09/24/21   [provider]  carboxymethylcellulose (REFRESH PLUS) 0.5 % SOLN  Place 1 drop into both eyes every 4 (four) hours as needed (for irritation).    [provider]  cetirizine (ZYRTEC) 10 MG tablet Take 1 tablet (10 mg total) by mouth daily. Patient taking differently: Take 10 mg by mouth daily as needed for allergies. 05/23/17   Regalado, Belkys A, MD  Cholecalciferol (VITAMIN D3) 50 MCG (2000 UT) TABS Take 4,000 Units by mouth daily.    [provider]  diclofenac sodium (VOLTAREN) 1 % GEL Apply 4 g topically 4 (four) times daily as needed (pain.).     [provider]  dicyclomine (BENTYL) 20 MG tablet Take 0.5 tablets (10 mg total) by mouth 2 (two) times daily as needed for up to 5 days for spasms. Patient not taking: Reported on 03/02/2022 10/24/21 12/11/21  Sloan Leiter, DO  doxycycline (VIBRA-TABS) 100 MG tablet Take 1 tablet (100 mg total) by mouth 2 (two) times daily. 01/14/23   Hunsucker, Lesia Sago, MD  famotidine (PEPCID) 40 MG tablet Take 40 mg by mouth at bedtime.    [provider]  finasteride (PROSCAR) 5 MG tablet Take 5 mg by mouth daily.     [provider]  fluticasone (FLONASE) 50 MCG/ACT nasal spray Place 1 spray into both nostrils daily.    [provider]  gabapentin (NEURONTIN) 400 MG capsule Take 400 mg by mouth 3 (three) times daily.     [provider]  glucose blood (ACCU-CHEK AVIVA) test  strip Use as instructed Patient taking differently: 4 (four) times daily -  before meals and at bedtime. 09/02/16   Vivianne Master, PA-C  insulin aspart protamine - aspart (NOVOLOG 70/30 MIX) (70-30) 100 UNIT/ML FlexPen Inject 30 Units into the skin 2 (two) times daily with a meal.    [provider]  latanoprost (XALATAN) 0.005 % ophthalmic solution Place 1 drop into both eyes at bedtime.     [provider]  levETIRAcetam (KEPPRA) 500 MG tablet Take 1,000 mg by mouth 2 (two) times daily.    [provider]  metFORMIN (GLUCOPHAGE) 1000 MG tablet Take 1,000 mg by mouth  2 (two) times daily.    [provider]  methazolamide (NEPTAZANE) 50 MG tablet Take 1 tablet (50 mg total) by mouth daily. 03/06/22   Pokhrel, Rebekah [place], MD  mirabegron ER (MYRBETRIQ) 50 MG TB24 tablet Take 50 mg by mouth daily.    [provider]  mirtazapine (REMERON) 15 MG tablet Take 15 mg by mouth at bedtime.    [provider]  mometasone [facility]) 220 MCG/ACT inhaler Inhale 2 puffs into the lungs daily. At night    [provider]  Netarsudil Dimesylate 0.02 % SOLN Place 1 drop into both eyes daily.    [provider]  nicotine (NICODERM CQ - DOSED IN MG/24 HR) 7 mg/24hr patch Place 7 mg onto the skin daily.    [provider]  nicotine polacrilex (NICORETTE) 2 MG gum Take 2 mg by mouth 3 (three) times daily as needed for smoking cessation.    [provider]  pantoprazole (PROTONIX) 40 MG tablet Take 1 tablet (40 mg total) by mouth daily. 06/11/23   Terrilee Files, MD  promethazine (PHENERGAN) 25 MG tablet Take 12.5 mg by mouth 3 (three) times daily as needed for nausea or vomiting. 12/02/21   [provider]  psyllium (METAMUCIL SMOOTH TEXTURE) 28 % packet Take 1 packet by mouth daily as needed (for constipation).    [provider]  Semaglutide (OZEMPIC, 0.25 OR 0.5 MG/DOSE, [place]) Inject 0.5 mg into the skin every Friday.    [provider]  senna (SENOKOT) 8.6 MG TABS tablet Take 1 tablet by mouth at bedtime.    [provider]  sucralfate (CARAFATE) 1 GM/10ML suspension Take 10 mLs (1 g total) by mouth 4 (four) times daily -  with meals and at bedtime. Patient taking differently: Take 1 g by mouth 4 (four) times daily as needed (stomach pain). 11/20/21   Couture, Cortni S, PA-C  tamsulosin (FLOMAX) 0.4 MG CAPS capsule Take 1 capsule (0.4 mg total) by mouth daily after supper. Patient taking differently: Take 0.4 mg by mouth daily. 02/23/18   Burnadette Pop, MD  venlafaxine XR (EFFEXOR-XR) 75 MG  24 hr capsule Take 225 mg by mouth daily with breakfast.    [provider]  vitamin B-12 (CYANOCOBALAMIN) 500 MCG tablet Take 500 mcg by mouth daily.    [provider]      Allergies    Penicillins, Percocet [oxycodone-acetaminophen], and Levaquin [levofloxacin in d5w]    Review of Systems   Review of Systems  Constitutional:  Positive for fever.  HENT:  Positive for rhinorrhea. Negative for trouble swallowing.   Eyes:  Negative for discharge and redness.  Respiratory:  Positive for cough. Negative for shortness of breath.   Cardiovascular:  Negative for chest pain.  Gastrointestinal:  Negative for abdominal pain, diarrhea and vomiting.  Genitourinary:  Negative for  dysuria.  Musculoskeletal:  Positive for myalgias. Negative for neck pain and neck stiffness.  Skin:  Negative for rash.  Neurological:  Negative for headaches.    Physical Exam Updated Vital Signs BP (!) 137/100 (BP Location: Left Arm)   Pulse 61   Temp 98.1 F (36.7 C)   Resp 19   Ht 1.88 m (6\' 2" )   Wt 96.6 kg   SpO2 95%   BMI 27.35 kg/m  Physical Exam Vitals and nursing note reviewed.  Constitutional:      Appearance: Normal appearance. He is well-developed.  HENT:     Head: Atraumatic.     Right Ear: Tympanic membrane normal.     Left Ear: Tympanic membrane normal.     Nose: Congestion and rhinorrhea present.     Mouth/Throat:     Mouth: Mucous membranes are moist.     Pharynx: Oropharynx is clear.  Eyes:     General: No scleral icterus.    Conjunctiva/sclera: Conjunctivae normal.     Pupils: Pupils are equal, round, and reactive to light.  Neck:     Trachea: No tracheal deviation.     Comments: No stiffness or rigidity Cardiovascular:     Rate and Rhythm: Normal rate and regular rhythm.     Pulses: Normal pulses.     Heart sounds: Normal heart sounds. No murmur heard.    No friction rub. No gallop.  Pulmonary:     Effort: Pulmonary effort is normal. No accessory muscle  usage or respiratory distress.     Breath sounds: Normal breath sounds.  Abdominal:     General: There is no distension.     Palpations: Abdomen is soft.     Tenderness: There is no abdominal tenderness.  Genitourinary:    Comments: No cva tenderness. Musculoskeletal:        General: No swelling or tenderness.     Cervical back: Normal range of motion and neck supple. No rigidity.     Right lower leg: No edema.     Left lower leg: No edema.  Lymphadenopathy:     Cervical: No cervical adenopathy.  Skin:    General: Skin is warm and dry.     Findings: No rash.  Neurological:     Mental Status: He is alert.     Comments: Alert, speech clear. Motor/sens grossly intact.   Psychiatric:        Mood and Affect: Mood normal.     ED Results / Procedures / Treatments   Labs (all labs ordered are listed, but only abnormal results are displayed) Results for orders placed or performed during the hospital encounter of 08/19/23  Resp panel by RT-PCR (RSV, Flu A&B, Covid) Anterior Nasal Swab   Specimen: Anterior Nasal Swab  Result Value Ref Range   SARS Coronavirus 2 by RT PCR POSITIVE (A) NEGATIVE   Influenza A by PCR NEGATIVE NEGATIVE   Influenza B by PCR NEGATIVE NEGATIVE   Resp Syncytial Virus by PCR NEGATIVE NEGATIVE      EKG None  Radiology No results found.  Procedures Procedures    Medications Ordered in ED Medications  acetaminophen (TYLENOL) tablet 1,000 mg (has no administration in time range)  ibuprofen (ADVIL) tablet 400 mg (has no administration in time range)    ED Course/ Medical Decision Making/ A&P  Medical Decision Making Problems Addressed: COVID-19 virus infection: acute illness or injury with systemic symptoms that poses a threat to life or bodily functions Elevated blood pressure reading: acute illness or injury  Amount and/or Complexity of Data Reviewed Independent Historian:     Details: Fam/friend,  hx External Data Reviewed: notes. Labs: ordered. Decision-making details documented in ED Course.  Risk OTC drugs. Prescription drug management.   No meds pta. Acetaminophen po. Ibuprofen po. Po fluids.  Labs reviewed/interpreted by me - covid pos.   Reviewed nursing notes and prior charts for additional history.   Pt breathing comfortably, no distress. O2 sats 95%.   Pt currently appears stable for d/c.           Final Clinical Impression(s) / ED Diagnoses Final diagnoses:  None    Rx / DC Orders ED Discharge Orders     None         Cathren Laine, MD 08/19/23 2255

## 2023-08-19 NOTE — ED Notes (Signed)
DC papers reviewed. No questions or concerns. No signs of distress. Pt assisted to wheelchair and out to lobby. Appropriate measures for safety taken. 

## 2023-08-19 NOTE — Discharge Instructions (Signed)
It was our pleasure to provide your ER care today - we hope that you feel better.  Your covid test is positive.  Drink plenty of fluids/stay well hydrated. Stay active.   You may try myquil, mucinex, robitussin or similar over the counter cold and flu medication for symptom relief.   Follow up with primary care doctor in two weeks if symptoms fail to improve/resolve. Also follow up with your doctor regarding your blood pressure that is high today.   Return to ER if worse, new symptoms, increased trouble breathing, or other emergency concern.

## 2023-08-19 NOTE — ED Triage Notes (Signed)
Generalized body aches, cough, sore throat, runny nose, subjective fever. Afebrile in triage. Symptoms onset over the weekend.

## 2024-01-06 ENCOUNTER — Telehealth: Payer: Self-pay | Admitting: Pulmonary Disease

## 2024-01-06 NOTE — Telephone Encounter (Signed)
PT needs [place] to call in Breztri & Albuterol to the [facility]. They tried to order it direct but [place] Said they'd have to call here to ask [place] to call it in. [place]

## 2024-01-12 MED ORDER — BREZTRI AEROSPHERE 160-9-4.8 MCG/ACT IN AERO: 2.0000 | INHALATION_SPRAY | Freq: Two times a day (BID) | RESPIRATORY_TRACT | 6 refills | Status: DC

## 2024-01-12 MED ORDER — ALBUTEROL SULFATE HFA 108 (90 BASE) MCG/ACT IN AERS: 2.0000 | INHALATION_SPRAY | Freq: Four times a day (QID) | RESPIRATORY_TRACT | 2 refills | Status: AC | PRN

## 2024-01-12 NOTE — Telephone Encounter (Signed)
Breztri and albuterol rxs sent to [facility] pharmacy  I spoke with the pt's spouse and notified her that this was done  Nothing further needed

## 2024-02-11 ENCOUNTER — Ambulatory Visit: Payer: No Typology Code available for payment source | Admitting: Pulmonary Disease

## 2024-03-22 ENCOUNTER — Ambulatory Visit (HOSPITAL_COMMUNITY)

## 2024-03-25 ENCOUNTER — Telehealth: Payer: Self-pay | Admitting: Pulmonary Disease

## 2024-03-25 NOTE — Telephone Encounter (Signed)
 Copied from CRM [id number]. Topic: Appointments - Scheduling Inquiry for Clinic >> Mar 22, 2024  8:59 AM Gaetano Hawthorne wrote: Reason for CRM: [facility] and CT this morning - they were advised that the patient's CT was rescheduled yesterday afternoon and that we left the patient a message with their new date and time. Contact Center Agent confirmed the fax number with the [place].   They asked if we reach out to the patient again to ensure that they are aware of when and where their CT scan is scheduled? >> Mar 22, 2024  2:16 PM Darleen Crocker wrote: PCCs please reach back out to patient to discuss CT scan. [facility] Auth is in >> Mar 22, 2024  9:13 AM Sherri Rad J wrote: Did you receive a [place] auth for this patient? He is wanting to get his CT scan moved up.

## 2024-03-29 ENCOUNTER — Ambulatory Visit (HOSPITAL_COMMUNITY)
Admission: RE | Admit: 2024-03-29 | Discharge: 2024-03-29 | Disposition: A | Discharge status: Home / Self Care | Source: Ambulatory Visit | Attending: Pulmonary Disease | Admitting: Pulmonary Disease

## 2024-03-29 DIAGNOSIS — Z716 Tobacco abuse counseling: Secondary | ICD-10-CM | POA: Insufficient documentation

## 2024-03-29 NOTE — Telephone Encounter (Signed)
 Called Pt. Pt is already aware of the appointment change. NFN

## 2024-04-11 ENCOUNTER — Encounter: Payer: Self-pay | Admitting: Pulmonary Disease

## 2024-04-11 ENCOUNTER — Ambulatory Visit: Payer: No Typology Code available for payment source | Admitting: Pulmonary Disease

## 2024-04-11 ENCOUNTER — Ambulatory Visit: Admitting: Pulmonary Disease

## 2024-04-11 VITALS — BP 154/91 | HR 84 | Temp 98.0°F | Resp 18 | Ht 74.0 in | Wt 207.6 lb

## 2024-04-11 DIAGNOSIS — F17210 Nicotine dependence, cigarettes, uncomplicated: Secondary | ICD-10-CM | POA: Diagnosis not present

## 2024-04-11 DIAGNOSIS — Z720 Tobacco use: Secondary | ICD-10-CM

## 2024-04-11 DIAGNOSIS — Z122 Encounter for screening for malignant neoplasm of respiratory organs: Secondary | ICD-10-CM

## 2024-04-11 DIAGNOSIS — J449 Chronic obstructive pulmonary disease, unspecified: Secondary | ICD-10-CM | POA: Diagnosis not present

## 2024-04-11 MED ORDER — BREZTRI AEROSPHERE 160-9-4.8 MCG/ACT IN AERO: 2.0000 | INHALATION_SPRAY | Freq: Two times a day (BID) | RESPIRATORY_TRACT | 12 refills | Status: AC

## 2024-04-11 NOTE — Progress Notes (Signed)
 @Patient  ID: Wesley [name], male    DOB: [birth year], 68 y.o.   MRN: [id number]  Chief Complaint  Patient presents with   Follow-up    COPD    Referring provider: Clinic, [facility]  HPI:   68 y.o. man whom we are seeing in follow up for evaluation of COPD.  Most recent pulmonary note 07/2023 reviewed.  Patient returns for routine follow-up.  Overall stable.  Residual dyspnea on exertion.  Unchanged.  Last exacerbation 12/2022 at last visit with me.  No prednisone  in the interim per his report.  We discussed continuing current regimen.  He reports good adherence to Breztri .  Also discussed adding new medication for COPD to help with his dyspnea.  HPI at initial visit: Patient doing well.  Baseline dyspnea stable.  Has COPD for many years.  Was seen in the clinic here for several years.  Then transferred care to the [place].  [facility] now here establish care.  Has baseline dyspnea with stairs and inclines.  No better or worse.  He feels Stiolto is very beneficial in terms of helping his shortness of breath.  He takes Asmanex  at night only.  He is not sure how much it helped.  He notes he sleeps well but not sure how much it helps his breathing as he is sleeping when this medication is in the system.  Reviewed most recent cross-sectional image in our EMR 11/20/2021 though my review and interpretation shows no PE, bullous emphysematous changes, no other abnormality.  Reviewed results of most recent CT lung cancer screening 01/17/2022 report lung RADS 2, several stable subcentimeter nodules noted.  PMH: Emphysema, asthma, GERD Surgical history: Prostate resection, cardiac cath, cystoscopy and stent placement Family history: Mother and father both with cancer, sister with lung cancer Social history: Current smoker, down to 7 cigarettes a day, trying to cut down, down significantly from 1 pack a day, lives in [place] / Pulmonary Flowsheets:   ACT:       No data to display          MMRC:     No data to display          Epworth:     11/18/2016   11:00 AM  Results of the Epworth flowsheet  Sitting and reading 0  Watching TV 2  Sitting, inactive in a public place (e.g. a theatre or a meeting) 1  As a passenger in a car for an hour without a break 0  Lying down to rest in the afternoon when circumstances permit 1  Sitting and talking to someone 0  Sitting quietly after a lunch without alcohol  0  In a car, while stopped for a few minutes in traffic 0  Total score 4    Tests:   FENO:  No results found for: "NITRICOXIDE"  PFT:    Latest Ref Rng & Units 10/28/2022   10:33 AM 04/17/2016   10:18 AM  PFT Results  FVC-Pre L 3.59  3.45   FVC-Predicted Pre % 68  74   FVC-Post L 3.91  3.79   FVC-Predicted Post % 74  81   Pre FEV1/FVC % % 68  55   Post FEV1/FCV % % 66  59   FEV1-Pre L 2.43  1.89   FEV1-Predicted Pre % 61  52   FEV1-Post L 2.58  2.24   DLCO uncorrected ml/min/mmHg 15.61  17.65   DLCO UNC% % 52  46  DLCO corrected ml/min/mmHg 15.61    DLCO COR %Predicted % 52    DLVA Predicted % 67  64   TLC L 6.42  6.73   TLC % Predicted % 82  86   RV % Predicted % 100  124   Personally reviewed interpreted as moderate fixed obstruction, significant bronchodilator response, lung volumes consistent with air trapping, DLCO severely reduced, DLCO improves corrected for alveolar volume  WALK:     08/26/2018    4:18 PM  SIX MIN WALK  2 Minute Oxygen  Saturation % 93 %  2 Minute HR 75  4 Minute Oxygen  Saturation % 95 %  4 Minute HR 79  6 Minute Oxygen  Saturation % 95 %  6 Minute HR 76    Imaging: Personally reviewed and as per EMR discussion this note   Lab Results: Personally reviewed CBC    Component Value Date/Time   WBC 6.5 06/11/2023 1846   RBC 5.32 06/11/2023 1846   HGB 14.8 06/11/2023 1846   HCT 45.7 06/11/2023 1846   HCT TEST REQUEST RECEIVED WITHOUT APPROPRIATE SPECIMEN 05/20/2017 0225   PLT  195 06/11/2023 1846   MCV 85.9 06/11/2023 1846   MCH 27.8 06/11/2023 1846   MCHC 32.4 06/11/2023 1846   RDW 14.7 06/11/2023 1846   LYMPHSABS 2.8 03/03/2022 0004   MONOABS 0.3 03/03/2022 0004   EOSABS 0.2 03/03/2022 0004   BASOSABS 0.0 03/03/2022 0004    BMET    Component Value Date/Time   NA 141 06/11/2023 1846   K 4.2 06/11/2023 1846   CL 106 06/11/2023 1846   CO2 26 06/11/2023 1846   GLUCOSE 187 (H) 06/11/2023 1846   BUN 15 06/11/2023 1846   CREATININE 0.93 06/11/2023 1846   CREATININE 0.96 03/14/2019 1046   CALCIUM  10.5 (H) 06/11/2023 1846   GFRNONAA >60 06/11/2023 1846   GFRAA >60 09/07/2020 1156    BNP    Component Value Date/Time   BNP 8.7 06/11/2023 1852    ProBNP No results found for: "PROBNP"  Specialty Problems       Pulmonary Problems   COPD mixed type (HCC)   Obstructive sleep apnea    Allergies  Allergen Reactions   Penicillins Swelling    "General swelling" Tolerated cefepime  & ceftriaxone  previously. Has patient had a PCN reaction causing immediate rash, facial/tongue/throat swelling, SOB or lightheadedness with hypotension: Yes Has patient had a PCN reaction causing severe rash involving mucus membranes or skin necrosis: Unk Has patient had a PCN reaction that required hospitalization: Yes Has patient had a PCN reaction occurring within the last 10 years: No If all of the above answers are "NO", then may proceed with Cephalosporin   Percocet [Oxycodone -Acetaminophen ]     Itching   Levaquin  [Levofloxacin  In D5w] Hives    Immunization History  Administered Date(s) Administered   Fluad Quad(high Dose 65+) 01/17/2022   Influenza Split 10/05/2016   Influenza, High Dose Seasonal PF 01/19/2023   Influenza,inj,Quad PF,6+ Mos 10/04/2015, 08/29/2016, 11/17/2017, 09/08/2019, 08/30/2020   Influenza,inj,quad, With Preservative 03/10/2019   Influenza-Unspecified 09/14/2017   Moderna Covid-19 Fall Seasonal Vaccine 52yrs & older 02/25/2024   Moderna  Covid-19 Vaccine Bivalent Booster 45yrs & up 01/17/2022   Moderna Sars-Covid-2 Vaccination 06/12/2021   PFIZER(Purple Top)SARS-COV-2 Vaccination 02/14/2020, 03/06/2020, 09/25/2020   Pfizer(Comirnaty)Fall Seasonal Vaccine 12 years and older 01/19/2023   Pneumococcal Conjugate-13 11/17/2016   Pneumococcal Polysaccharide-23 03/30/2017, 04/14/2017   Rsv, Bivalent, Protein Subunit Rsvpref,pf Pattricia Bores) 05/26/2023   Tdap 11/17/2016, 03/30/2017   Zoster Recombinant(Shingrix)  06/02/2019, 10/07/2019    Past Medical History:  Diagnosis Date   Aneurysm of infrarenal abdominal aorta (HCC)    CT 11-23-2018 , 3.2cm   Anxiety    Benign localized prostatic hyperplasia with lower urinary tract symptoms (LUTS)    Cancer (HCC)    kidney cancer   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   CAP (community acquired pneumonia) 11/23/2018   per pt had follow up by pcp at [facility] with CXR done after christmas   Chronic insomnia    COPD with emphysema (HCC)    Coronary artery disease    followed by cardiologist @ [facility]---  per cardiac cath 07-11-2015 mild plaquing of the CFx and RCA, normal LVF [facility] [place] copy in epic)   Dementia [facility])    "some per wife"    Depression    Diabetes mellitus without complication (HCC)    type 2    Diverticulosis of colon    GERD (gastroesophageal reflux disease)    Glaucoma    Hematuria    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 2001   History of urinary retention    Hyperlipidemia    Hypertension    Incomplete emptying of bladder    Left-sided weakness 08/27/2016   CVA residual   Myocardial infarction [facility]) 2015   Neuromuscular disorder (HCC)     neuropathy feet and hands   OA (osteoarthritis)    knees, feet, wrists, back   Pacemaker    St. Jude   Paroxysmal atrial tachycardia (HCC)    Polysubstance abuse (HCC)    per pt from 2001 to 2016 has had couple of relapses since--  12-31-2018 per pt last relapse with cocaine approx. Oct 2019   Renal mass, left    pelvic   Retinal vein occlusion 01/2016   right eye, secondary to hypertension   S/P placement of cardiac pacemaker 12/11/2014   St Jude dual chamber (followed by Nada Auer)   Seizures [facility])    wife reported last one  2 WEEKS AGO AS OF 07-29-2019   Sepsis (HCC) 02/2019   WENT HOME ON IV MEDS FOR 2  1/2 WEEKS   Shortness of breath    WITH ACTIVITY   Sleep apnea    does not tolerate cpap   SSS (sick sinus syndrome) (HCC)    treatment pacemaker placement   Stroke (HCC) 2017 OR 2018   had therapy  slow movement now ( 02/01/2019)  right side weaker   Symptomatic bradycardia    Syncope    Tremor, essential 03/31/2016   Tuberculosis    1990s    Wears glasses    Wears hearing aid in both ears     Tobacco History: Social History   Tobacco Use  Smoking Status Every Day   Current packs/day: 0.50   Average packs/day: 0.5 packs/day for 40.0 years (20.0 ttl pk-yrs)   Types: Cigarettes  Smokeless Tobacco Never  Tobacco Comments   per trying to quit with nicotine  patch every now and then.  down to 3 cigs a day.  08/03/2023 hfb   Ready to quit: Not Answered Counseling given: Not  Answered Tobacco comments: per trying to quit with nicotine  patch every now and then.  down to 3 cigs a day.  08/03/2023 hfb   Continue to not smoke  Outpatient Encounter Medications as of 04/11/2024  Medication Sig   acetaminophen  (TYLENOL ) 500 MG tablet Take 1,000 mg by mouth every 6 (six) hours as needed for moderate pain or headache.   albuterol  (VENTOLIN  HFA) 108 (90 Base) MCG/ACT inhaler Inhale 2 puffs into the lungs every 6 (six) hours as needed for wheezing or shortness of breath.    aspirin  EC 325 MG tablet Take 325 mg by mouth daily.   atorvastatin  (LIPITOR ) 20 MG tablet Take 20 mg by mouth daily.   Blood Glucose Monitoring Suppl (ACCU-CHEK AVIVA PLUS) w/Device KIT 1 Device by Does not apply route 4 (four) times daily.   Brinzolamide -Brimonidine  1-0.2 % SUSP Place 1 drop into both eyes 3 (three) times daily.   carbidopa -levodopa  (SINEMET  IR) 25-100 MG tablet Take 1 tablet by mouth 3 (three) times daily.   carboxymethylcellulose (REFRESH PLUS) 0.5 % SOLN Place 1 drop into both eyes every 4 (four) hours as needed (for irritation).   cetirizine  (ZYRTEC ) 10 MG tablet Take 1 tablet (10 mg total) by mouth daily. (Patient taking differently: Take 10 mg by mouth daily as needed for allergies.)   Cholecalciferol  (VITAMIN D3) 50 MCG (2000 UT) TABS Take 4,000 Units by mouth daily.   diclofenac  sodium (VOLTAREN ) 1 % GEL Apply 4 g topically 4 (four) times daily as needed (pain.).    doxycycline  (VIBRA -TABS) 100 MG tablet Take 1 tablet (100 mg total) by mouth 2 (two) times daily.   famotidine  (PEPCID ) 40 MG tablet Take 40 mg by mouth at bedtime.   finasteride  (PROSCAR ) 5 MG tablet Take 5 mg by mouth daily.    fluticasone  (FLONASE ) 50 MCG/ACT nasal spray Place 1 spray into both nostrils daily.   gabapentin  (NEURONTIN ) 400 MG capsule Take 400 mg by mouth 3 (three) times daily.    glucose blood (ACCU-CHEK AVIVA) test strip Use as instructed (Patient taking differently: 4 (four) times daily -  before meals and at bedtime.)   insulin  aspart protamine - aspart (NOVOLOG  70/30 MIX) (70-30) 100 UNIT/ML FlexPen Inject 30 Units into the skin 2 (two) times daily with a meal.   latanoprost  (XALATAN ) 0.005 % ophthalmic solution Place 1 drop into both eyes at bedtime.    levETIRAcetam  (KEPPRA ) 500 MG tablet Take 1,000 mg by mouth 2 (two) times daily.   metFORMIN  (GLUCOPHAGE ) 1000 MG tablet Take 1,000 mg by mouth 2 (two) times daily.   methazolamide  (NEPTAZANE ) 50 MG tablet Take 1 tablet (50 mg total) by  mouth daily.   mirabegron  ER (MYRBETRIQ ) 50 MG TB24 tablet Take 50 mg by mouth daily.   mirtazapine  (REMERON ) 15 MG tablet Take 15 mg by mouth at bedtime.   mometasone  (ASMANEX ) 220 MCG/ACT inhaler Inhale 2 puffs into the lungs daily. At night   Netarsudil  Dimesylate 0.02 % SOLN Place 1 drop into both eyes daily.   nicotine  (NICODERM CQ  - DOSED IN MG/24 HR) 7 mg/24hr patch Place 7 mg onto the skin daily.   nicotine  polacrilex (NICORETTE) 2 MG gum Take 2 mg by mouth 3 (three) times daily as needed for smoking cessation.   pantoprazole  (PROTONIX ) 40 MG tablet Take 1 tablet (40 mg total) by mouth daily.   promethazine  (PHENERGAN ) 25 MG tablet Take 12.5 mg by mouth 3 (three) times daily as needed for nausea or vomiting.   psyllium (METAMUCIL  SMOOTH TEXTURE) 28 % packet Take 1 packet by mouth daily as needed (for constipation).   Semaglutide  (OZEMPIC , 0.25 OR 0.5 MG/DOSE, [place]) Inject 0.5 mg into the skin every Friday.   senna (SENOKOT) 8.6 MG TABS tablet Take 1 tablet by mouth at bedtime.   sucralfate  (CARAFATE ) 1 GM/10ML suspension Take 10 mLs (1 g total) by mouth 4 (four) times daily -  with meals and at bedtime. (Patient taking differently: Take 1 g by mouth 4 (four) times daily as needed (stomach pain).)   tamsulosin  (FLOMAX ) 0.4 MG CAPS capsule Take 1 capsule (0.4 mg total) by mouth daily after supper. (Patient taking differently: Take 0.4 mg by mouth daily.)   venlafaxine  XR (EFFEXOR -XR) 75 MG 24 hr capsule Take 225 mg by mouth daily with breakfast.   vitamin B-12 (CYANOCOBALAMIN ) 500 MCG tablet Take 500 mcg by mouth daily.   [DISCONTINUED] Budeson-Glycopyrrol-Formoterol  (BREZTRI  AEROSPHERE) 160-9-4.8 MCG/ACT AERO Inhale 2 puffs into the lungs in the morning and at bedtime.   budeson-glycopyrrolate-formoterol  (BREZTRI  AEROSPHERE) 160-9-4.8 MCG/ACT AERO inhaler Inhale 2 puffs into the lungs in the morning and at bedtime.   dicyclomine  (BENTYL ) 20 MG tablet Take 0.5 tablets (10 mg total) by mouth 2  (two) times daily as needed for up to 5 days for spasms. (Patient not taking: Reported on 03/02/2022)   No facility-administered encounter medications on file as of 04/11/2024.     Review of Systems  Review of Systems  N/a Physical Exam  BP (!) 154/91 (BP Location: Left Arm, Patient Position: Sitting, Cuff Size: Normal)   Pulse 84   Temp 98 F (36.7 C) (Oral)   Resp 18   Ht 6\' 2"  (1.88 m)   Wt 207 lb 9.6 oz (94.2 kg)   SpO2 99%   BMI 26.65 kg/m   Wt Readings from Last 5 Encounters:  04/11/24 207 lb 9.6 oz (94.2 kg)  08/19/23 213 lb (96.6 kg)  08/03/23 223 lb 12.8 oz (101.5 kg)  01/14/23 209 lb 3.2 oz (94.9 kg)  09/25/22 201 lb (91.2 kg)    BMI Readings from Last 5 Encounters:  04/11/24 26.65 kg/m  08/19/23 27.35 kg/m  08/03/23 28.73 kg/m  01/14/23 26.86 kg/m  09/25/22 25.81 kg/m     Physical Exam General: Sitting in chair, no acute distress Eyes: EOMI, icterus Neck: Supple, no JVP Pulmonary: Distant, no wheeze Cardiovascular: Regular rate and rhythm, no murmur Abdomen: Nondistended, bowel sounds present MSK: No synovitis, joint effusion Neuro: Normal gait, no weakness Psych: Normal mood, full affect   Assessment & Plan:   COPD: As demonstrated on prior PFTs.  Gold D.  Historically does not frequently exacerbate but with prolonged and worsening symptoms summer 2023 with exacerbation.  Continue Breztri .  Refill prescription today.  Residual dyspnea on exertion.  Recommend starting Ohtuvayre , will trial for 3 months and if no improvement can discontinue in the future.  New prescription today.  Tobacco abuse: Currently up-to-date with lung cancer screening, most recent scan 03/2024 lung RADS 2.  Recommend continued lung cancer screening.  After shared decision-making using a decision made, patient agrees to continue on lung cancer screening and prefer to do it here instead of the [place].  New order to continue lung cancer screening 03/2025, CT scan  ordered.    Return in about 3 months (around 07/11/2024) for f/u Dr. Marygrace Snellen.   Guerry Leek, MD 04/11/2024

## 2024-04-11 NOTE — Patient Instructions (Signed)
 Nice to see you again  CT scan is stable this is great news need to repeat 1 in 1 year  Will continue Breztri  2 puffs twice a day every day, rinse your mouth after every use  In effort to see if we can help your breathing a bit more  Try a new medicine called Ohtuvayre  -this is a nebulizer twice a day  Will work on getting this approved via insurance from our pharmacy team.  Return to clinic in 3 months or sooner as needed with Dr. Marygrace Snellen

## 2024-04-12 ENCOUNTER — Telehealth: Payer: Self-pay

## 2024-04-12 NOTE — Telephone Encounter (Signed)
 Received Ohtuvayre new start paperwork. Completed form and faxed with clinicals and insurance card copy to [facility]   Phone#: [phone] Fax#: [phone]

## 2024-04-13 NOTE — Telephone Encounter (Signed)
 Received fax from [employer] with summary of benefits. Referral form for Ohtuvayre  received. Rx will be triaged to [facility].. Once benefits investigation completed, pharmacy will reach out the patient to schedule shipment. If medication is unaffordable, patient will need to express financial hardship to be referred back to [place] [facility] for patient assistance program pre-screening.   Patient ID: [id number] Pharmacy phone: [phone] [place] [facility] Phone#: [phone]

## 2024-08-01 ENCOUNTER — Ambulatory Visit: Admitting: Pulmonary Disease

## 2024-08-04 ENCOUNTER — Encounter: Payer: Self-pay | Admitting: Pulmonary Disease

## 2024-08-04 ENCOUNTER — Ambulatory Visit (INDEPENDENT_AMBULATORY_CARE_PROVIDER_SITE_OTHER): Admitting: Pulmonary Disease

## 2024-08-04 VITALS — BP 125/77 | HR 78 | Temp 97.4°F | Ht 74.0 in | Wt 203.2 lb

## 2024-08-04 DIAGNOSIS — F17210 Nicotine dependence, cigarettes, uncomplicated: Secondary | ICD-10-CM | POA: Diagnosis not present

## 2024-08-04 DIAGNOSIS — J449 Chronic obstructive pulmonary disease, unspecified: Secondary | ICD-10-CM

## 2024-08-04 MED ORDER — AZITHROMYCIN 250 MG PO TABS: ORAL_TABLET | ORAL | 0 refills | Status: AC

## 2024-08-04 MED ORDER — PREDNISONE 20 MG PO TABS
40.0000 mg | ORAL_TABLET | Freq: Every day | ORAL | 0 refills | Status: AC
Start: 2024-08-04 — End: 2024-08-09

## 2024-08-04 NOTE — Progress Notes (Deleted)
 @Patient  ID: Wesley [name], male    DOB: February 01, 1956, 68 y.o.   MRN: [id number]  Chief Complaint  Patient presents with   Follow-up    COPD Pt has no concerns to address.     Referring provider: Clinic, [facility]  HPI:   68 y.o. man whom we are seeing in follow up for evaluation of COPD.  Most recent pulmonary note 07/2023 reviewed.  Patient returns for routine follow-up.  Overall stable.  68 y.o. a bit worse over the last month or so with increased cough and congestion.  Concern for mild exacerbation of COPD.  Discussed prednisone  and antibiotic. Ohtuvayre  not started they went to 500 out-of-pocket, this was a bit confusing.  Will see if we can get this now.  Suspect will be ongoing issue via [place].  HPI at initial visit: Patient doing well.  Baseline dyspnea stable.  Has COPD for many years.  Was seen in the clinic here for several years.  Then transferred care to the [facility].  [facility] now here establish care.  Has baseline dyspnea with stairs and inclines.  No better or worse.  He feels Stiolto is very beneficial in terms of helping his shortness of breath.  He takes Asmanex  at night only.  He is not sure how much it helped.  He notes he sleeps well but not sure how much it helps his breathing as he is sleeping when this medication is in the system.  Reviewed most recent cross-sectional image in our EMR 11/20/2021 though my review and interpretation shows no PE, bullous emphysematous changes, no other abnormality.  Reviewed results of most recent CT lung cancer screening 01/17/2022 report lung RADS 2, several stable subcentimeter nodules noted.  PMH: Emphysema, asthma, GERD Surgical history: Prostate resection, cardiac cath, cystoscopy and stent placement Family history: Mother and father both with cancer, sister with lung cancer Social history: Current smoker, down to 7 cigarettes a day, trying to cut down, down significantly from 1 pack a day, lives in  [place] / Pulmonary Flowsheets:   ACT:      No data to display          MMRC:     No data to display          Epworth:     11/18/2016   11:00 AM  Results of the Epworth flowsheet  Sitting and reading 0  Watching TV 2  Sitting, inactive in a public place (e.g. a theatre or a meeting) 1  As a passenger in a car for an hour without a break 0  Lying down to rest in the afternoon when circumstances permit 1  Sitting and talking to someone 0  Sitting quietly after a lunch without alcohol  0  In a car, while stopped for a few minutes in traffic 0  Total score 4    Tests:   FENO:  No results found for: NITRICOXIDE  PFT:    Latest Ref Rng & Units 10/28/2022   10:33 AM 04/17/2016   10:18 AM  PFT Results  FVC-Pre L 3.59  3.45   FVC-Predicted Pre % 68  74   FVC-Post L 3.91  3.79   FVC-Predicted Post % 74  81   Pre FEV1/FVC % % 68  55   Post FEV1/FCV % % 66  59   FEV1-Pre L 2.43  1.89   FEV1-Predicted Pre % 61  52   FEV1-Post L 2.58  2.24  DLCO uncorrected ml/min/mmHg 15.61  17.65   DLCO UNC% % 52  46   DLCO corrected ml/min/mmHg 15.61    DLCO COR %Predicted % 52    DLVA Predicted % 67  64   TLC L 6.42  6.73   TLC % Predicted % 82  86   RV % Predicted % 100  124   Personally reviewed interpreted as moderate fixed obstruction, significant bronchodilator response, lung volumes consistent with air trapping, DLCO severely reduced, DLCO improves corrected for alveolar volume  WALK:     08/26/2018    4:18 PM  SIX MIN WALK  2 Minute Oxygen  Saturation % 93 %  2 Minute HR 75  4 Minute Oxygen  Saturation % 95 %  4 Minute HR 79  6 Minute Oxygen  Saturation % 95 %  6 Minute HR 76    Imaging: Personally reviewed and as per EMR discussion this note   Lab Results: Personally reviewed CBC    Component Value Date/Time   WBC 6.5 06/11/2023 1846   RBC 5.32 06/11/2023 1846   HGB 14.8 06/11/2023 1846   HCT 45.7 06/11/2023 1846   HCT TEST  REQUEST RECEIVED WITHOUT APPROPRIATE SPECIMEN 05/20/2017 0225   PLT 195 06/11/2023 1846   MCV 85.9 06/11/2023 1846   MCH 27.8 06/11/2023 1846   MCHC 32.4 06/11/2023 1846   RDW 14.7 06/11/2023 1846   LYMPHSABS 2.8 03/03/2022 0004   MONOABS 0.3 03/03/2022 0004   EOSABS 0.2 03/03/2022 0004   BASOSABS 0.0 03/03/2022 0004    BMET    Component Value Date/Time   NA 141 06/11/2023 1846   K 4.2 06/11/2023 1846   CL 106 06/11/2023 1846   CO2 26 06/11/2023 1846   GLUCOSE 187 (H) 06/11/2023 1846   BUN 15 06/11/2023 1846   CREATININE 0.93 06/11/2023 1846   CREATININE 0.96 03/14/2019 1046   CALCIUM  10.5 (H) 06/11/2023 1846   GFRNONAA >60 06/11/2023 1846   GFRAA >60 09/07/2020 1156    BNP    Component Value Date/Time   BNP 8.7 06/11/2023 1852    ProBNP No results found for: PROBNP  Specialty Problems       Pulmonary Problems   COPD mixed type (HCC)   Obstructive sleep apnea    Allergies  Allergen Reactions   Penicillins Swelling    General swelling Tolerated cefepime  & ceftriaxone  previously. Has patient had a PCN reaction causing immediate rash, facial/tongue/throat swelling, SOB or lightheadedness with hypotension: Yes Has patient had a PCN reaction causing severe rash involving mucus membranes or skin necrosis: Unk Has patient had a PCN reaction that required hospitalization: Yes Has patient had a PCN reaction occurring within the last 10 years: No If all of the above answers are NO, then may proceed with Cephalosporin   Percocet [Oxycodone -Acetaminophen ]     Itching   Levaquin  [Levofloxacin  In D5w] Hives    Immunization History  Administered Date(s) Administered    sv, Bivalent, Protein Subunit Rsvpref,pf (Abrysvo) 05/26/2023   Fluad Quad(high Dose 68+) 01/17/2022   Influenza Split 10/05/2016   Influenza, High Dose Seasonal PF 01/19/2023   Influenza,inj,Quad PF,6+ Mos 10/04/2015, 08/29/2016, 11/17/2017, 09/08/2019, 08/30/2020   Influenza,inj,quad, With  Preservative 03/10/2019   Influenza-Unspecified 09/14/2017   Moderna Covid-19 Fall Seasonal Vaccine 50yrs & older 02/25/2024   Moderna Covid-19 Vaccine Bivalent Booster 14yrs & up 01/17/2022   Moderna Sars-Covid-2 Vaccination 06/12/2021   PFIZER(Purple Top)SARS-COV-2 Vaccination 02/14/2020, 03/06/2020, 09/25/2020   Pfizer(Comirnaty)Fall Seasonal Vaccine 12 years and older 01/19/2023   Pneumococcal  Conjugate-13 11/17/2016   Pneumococcal Polysaccharide-23 03/30/2017, 04/14/2017   Tdap 11/17/2016, 03/30/2017   Zoster Recombinant(Shingrix) 06/02/2019, 10/07/2019    Past Medical History:  Diagnosis Date   Aneurysm of infrarenal abdominal aorta (HCC)    CT 11-23-2018 , 3.2cm   Anxiety    Benign localized prostatic hyperplasia with lower urinary tract symptoms (LUTS)    Cancer (HCC)    kidney cancer   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   CAP (community acquired pneumonia) 11/23/2018   per pt had follow up by pcp at [facility] with CXR done after christmas   Chronic insomnia    COPD with emphysema (HCC)    Coronary artery disease    followed by cardiologist @ [facility]---  per cardiac cath 07-11-2015 mild plaquing of the CFx and RCA, normal LVF [facility] [place] copy in epic)   Dementia [facility])    some per wife    Depression    Diabetes mellitus without complication (HCC)    type 2    Diverticulosis of colon    GERD (gastroesophageal reflux disease)    Glaucoma    Hematuria    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 2001   History of urinary retention    Hyperlipidemia    Hypertension    Incomplete emptying of bladder    Left-sided weakness 08/27/2016   CVA residual    Myocardial infarction [facility]) 2015   Neuromuscular disorder (HCC)    neuropathy feet and hands   OA (osteoarthritis)    knees, feet, wrists, back   Pacemaker    St. Jude   Paroxysmal atrial tachycardia (HCC)    Polysubstance abuse (HCC)    per pt from 2001 to 2016 has had couple of relapses since--  12-31-2018 per pt last relapse with cocaine approx. Oct 2019   Renal mass, left    pelvic   Retinal vein occlusion 01/2016   right eye, secondary to hypertension   S/P placement of cardiac pacemaker 12/11/2014   St Jude dual chamber (followed by [facility]EN)   Seizures [facility])    wife reported last one  2 WEEKS AGO AS OF 07-29-2019   Sepsis (HCC) 02/2019   WENT HOME ON IV MEDS FOR 2  1/2 WEEKS   Shortness of breath    WITH ACTIVITY   Sleep apnea    does not tolerate cpap   SSS (sick sinus syndrome) (HCC)    treatment pacemaker placement   Stroke (HCC) 2017 OR 2018   had therapy  slow movement now ( 02/01/2019)  right side weaker   Symptomatic bradycardia    Syncope    Tremor, essential 03/31/2016   Tuberculosis    1990s    Wears glasses    Wears hearing aid in both ears     Tobacco History: Social History   Tobacco Use  Smoking Status Every Day   Current packs/day: 0.50   Average packs/day: 0.5 packs/day for 40.0 years (20.0 ttl pk-yrs)   Types: Cigarettes  Smokeless Tobacco Never  Tobacco Comments   7 cigarettes per day 08/04/24   Ready to quit: Not Answered Counseling given:  Not Answered Tobacco comments: 7 cigarettes per day 08/04/24   Continue to not smoke  Outpatient Encounter Medications as of 08/04/2024  Medication Sig   acetaminophen  (TYLENOL ) 500 MG tablet Take 1,000 mg by mouth every 6 (six) hours as needed for moderate pain or headache.   albuterol  (VENTOLIN  HFA) 108 (90 Base) MCG/ACT inhaler Inhale 2 puffs into the lungs every 6 (six) hours as needed for wheezing or shortness of breath.   aspirin  EC 325 MG tablet Take 325 mg by mouth daily.    atorvastatin  (LIPITOR ) 20 MG tablet Take 20 mg by mouth daily.   azithromycin  (ZITHROMAX ) 250 MG tablet Take 2 tablets (500 mg total) by mouth daily for 1 day, THEN 1 tablet (250 mg total) daily for 4 days.   Blood Glucose Monitoring Suppl (ACCU-CHEK AVIVA PLUS) w/Device KIT 1 Device by Does not apply route 4 (four) times daily.   Brinzolamide -Brimonidine  1-0.2 % SUSP Place 1 drop into both eyes 3 (three) times daily.   budeson-glycopyrrolate-formoterol  (BREZTRI  AEROSPHERE) 160-9-4.8 MCG/ACT AERO inhaler Inhale 2 puffs into the lungs in the morning and at bedtime.   carbidopa -levodopa  (SINEMET  IR) 25-100 MG tablet Take 1 tablet by mouth 3 (three) times daily.   carboxymethylcellulose (REFRESH PLUS) 0.5 % SOLN Place 1 drop into both eyes every 4 (four) hours as needed (for irritation).   cetirizine  (ZYRTEC ) 10 MG tablet Take 1 tablet (10 mg total) by mouth daily. (Patient taking differently: Take 10 mg by mouth daily as needed for allergies.)   Cholecalciferol  (VITAMIN D3) 50 MCG (2000 UT) TABS Take 4,000 Units by mouth daily.   diclofenac  sodium (VOLTAREN ) 1 % GEL Apply 4 g topically 4 (four) times daily as needed (pain.).    dicyclomine  (BENTYL ) 20 MG tablet Take 0.5 tablets (10 mg total) by mouth 2 (two) times daily as needed for up to 5 days for spasms.   doxycycline  (VIBRA -TABS) 100 MG tablet Take 1 tablet (100 mg total) by mouth 2 (two) times daily.   famotidine  (PEPCID ) 40 MG tablet Take 40 mg by mouth at bedtime.   finasteride  (PROSCAR ) 5 MG tablet Take 5 mg by mouth daily.    fluticasone  (FLONASE ) 50 MCG/ACT nasal spray Place 1 spray into both nostrils daily.   gabapentin  (NEURONTIN ) 400 MG capsule Take 400 mg by mouth 3 (three) times daily.    glucose blood (ACCU-CHEK AVIVA) test strip Use as instructed (Patient taking differently: 4 (four) times daily -  before meals and at bedtime.)   insulin  aspart protamine - aspart (NOVOLOG  70/30 MIX) (70-30) 100 UNIT/ML FlexPen Inject 30 Units into  the skin 2 (two) times daily with a meal.   latanoprost  (XALATAN ) 0.005 % ophthalmic solution Place 1 drop into both eyes at bedtime.    levETIRAcetam  (KEPPRA ) 500 MG tablet Take 1,000 mg by mouth 2 (two) times daily.   metFORMIN  (GLUCOPHAGE ) 1000 MG tablet Take 1,000 mg by mouth 2 (two) times daily.   methazolamide  (NEPTAZANE ) 50 MG tablet Take 1 tablet (50 mg total) by mouth daily.   mirabegron  ER (MYRBETRIQ ) 50 MG TB24 tablet Take 50 mg by mouth daily.   mirtazapine  (REMERON ) 15 MG tablet Take 15 mg by mouth at bedtime.   mometasone  (ASMANEX ) 220 MCG/ACT inhaler Inhale 2 puffs into the lungs daily. At night   Netarsudil  Dimesylate 0.02 % SOLN Place 1 drop into both eyes daily.   predniSONE  (DELTASONE ) 20 MG tablet Take 2 tablets (40 mg total) by mouth daily with breakfast for 5 days.  promethazine  (PHENERGAN ) 25 MG tablet Take 12.5 mg by mouth 3 (three) times daily as needed for nausea or vomiting.   psyllium (METAMUCIL SMOOTH TEXTURE) 28 % packet Take 1 packet by mouth daily as needed (for constipation).   Semaglutide  (OZEMPIC , 0.25 OR 0.5 MG/DOSE, [place]) Inject 0.5 mg into the skin every Friday.   senna (SENOKOT) 8.6 MG TABS tablet Take 1 tablet by mouth at bedtime.   sucralfate  (CARAFATE ) 1 GM/10ML suspension Take 10 mLs (1 g total) by mouth 4 (four) times daily -  with meals and at bedtime. (Patient taking differently: Take 1 g by mouth 4 (four) times daily as needed (stomach pain).)   tamsulosin  (FLOMAX ) 0.4 MG CAPS capsule Take 1 capsule (0.4 mg total) by mouth daily after supper. (Patient taking differently: Take 0.4 mg by mouth daily.)   venlafaxine  XR (EFFEXOR -XR) 75 MG 24 hr capsule Take 225 mg by mouth daily with breakfast.   vitamin B-12 (CYANOCOBALAMIN ) 500 MCG tablet Take 500 mcg by mouth daily.   nicotine  (NICODERM CQ  - DOSED IN MG/24 HR) 7 mg/24hr patch Place 7 mg onto the skin daily. (Patient not taking: Reported on 08/04/2024)   nicotine  polacrilex (NICORETTE) 2 MG gum Take 2 mg  by mouth 3 (three) times daily as needed for smoking cessation. (Patient not taking: Reported on 08/04/2024)   pantoprazole  (PROTONIX ) 40 MG tablet Take 1 tablet (40 mg total) by mouth daily. (Patient not taking: Reported on 08/04/2024)   No facility-administered encounter medications on file as of 08/04/2024.     Review of Systems  Review of Systems  N/a Physical Exam  BP 125/77   Pulse 78   Temp (!) 97.4 F (36.3 C)   Ht 6' 2 (1.88 m)   Wt 203 lb 3.2 oz (92.2 kg)   SpO2 96% Comment: RA  BMI 26.09 kg/m   Wt Readings from Last 5 Encounters:  08/04/24 203 lb 3.2 oz (92.2 kg)  04/11/24 207 lb 9.6 oz (94.2 kg)  08/19/23 213 lb (96.6 kg)  08/03/23 223 lb 12.8 oz (101.5 kg)  01/14/23 209 lb 3.2 oz (94.9 kg)    BMI Readings from Last 5 Encounters:  08/04/24 26.09 kg/m  04/11/24 26.65 kg/m  08/19/23 27.35 kg/m  08/03/23 28.73 kg/m  01/14/23 26.86 kg/m     Physical Exam General: Sitting in chair, no acute distress Eyes: EOMI, icterus Neck: Supple, no JVP Pulmonary: Distant, no wheeze Cardiovascular: Regular rate and rhythm, no murmur Abdomen: Nondistended, bowel sounds present MSK: No synovitis, joint effusion Neuro: Normal gait, no weakness Psych: Normal mood, full affect   Assessment & Plan:   COPD with mild subacute exacerbation: As demonstrated on prior PFTs.  Gold D.  Historically does not frequently exacerbate but with prolonged and worsening symptoms summer 2023 with exacerbation.  Continue Breztri .  Z-Pak, prednisone  40 mg daily x 5 days for mild exacerbation increased cough and shortness of breath.  Lung exam clear and reassuring.  Will message pharmacy colleagues that we back in [place] , $500 out-of-pocket expenses too much earlier in the year, will reach out to pharmacy colleagues see if we can reevaluate this  Tobacco abuse: Currently up-to-date with lung cancer screening, most recent scan 03/2024 lung RADS 2.  Recommend continued lung cancer screening.   After shared decision-making using a decision made, patient agrees to continue on lung cancer screening and prefer to do it here instead of the [place].  New order to continue lung cancer screening 03/2025, CT scan ordered.  Return in about 6 months (around 02/04/2025) for f/u Dr. Annella.   Donnice JONELLE Annella, MD 08/04/2024

## 2024-08-04 NOTE — Progress Notes (Signed)
 @Patient  ID: Wesley [name], male    DOB: July 29, 1956, 68 y.o.   MRN: [id number]  Chief Complaint  Patient presents with   Follow-up    COPD Pt has no concerns to address.     Referring provider: Clinic, [facility]  HPI:   68 y.o. man whom we are seeing in follow up for evaluation of COPD.  Multiple telephone encounter notes from our clinic in the interim reviewed.  Patient returns for routine follow-up.  Overall stable.  Residual dyspnea on exertion.  In a bit worse with increased cough or the last month so.  Concern for a subacute exacerbation discussed treatment of this today.  HPI at initial visit: Patient doing well.  Baseline dyspnea stable.  Has COPD for many years.  Was seen in the clinic here for several years.  Then transferred care to the [place].  [facility] also [facility] now here establish care.  Has baseline dyspnea with stairs and inclines.  No better or worse.  He feels Stiolto is very beneficial in terms of helping his shortness of breath.  He takes Asmanex  at night only.  He is not sure how much it helped.  He notes he sleeps well but not sure how much it helps his breathing as he is sleeping when this medication is in the system.  Reviewed most recent cross-sectional image in our EMR 11/20/2021 though my review and interpretation shows no PE, bullous emphysematous changes, no other abnormality.  Reviewed results of most recent CT lung cancer screening 01/17/2022 report lung RADS 2, several stable subcentimeter nodules noted.  PMH: Emphysema, asthma, GERD Surgical history: Prostate resection, cardiac cath, cystoscopy and stent placement Family history: Mother and father both with cancer, sister with lung cancer Social history: Current smoker, down to 7 cigarettes a day, trying to cut down, down significantly from 1 pack a day, lives in [place] / Pulmonary Flowsheets:   ACT:      No data to display          MMRC:     No data to  display          Epworth:     11/18/2016   11:00 AM  Results of the Epworth flowsheet  Sitting and reading 0  Watching TV 2  Sitting, inactive in a public place (e.g. a theatre or a meeting) 1  As a passenger in a car for an hour without a break 0  Lying down to rest in the afternoon when circumstances permit 1  Sitting and talking to someone 0  Sitting quietly after a lunch without alcohol  0  In a car, while stopped for a few minutes in traffic 0  Total score 4    Tests:   FENO:  No results found for: NITRICOXIDE  PFT:    Latest Ref Rng & Units 10/28/2022   10:33 AM 04/17/2016   10:18 AM  PFT Results  FVC-Pre L 3.59  3.45   FVC-Predicted Pre % 68  74   FVC-Post L 3.91  3.79   FVC-Predicted Post % 74  81   Pre FEV1/FVC % % 68  55   Post FEV1/FCV % % 66  59   FEV1-Pre L 2.43  1.89   FEV1-Predicted Pre % 61  52   FEV1-Post L 2.58  2.24   DLCO uncorrected ml/min/mmHg 15.61  17.65   DLCO UNC% % 52  46   DLCO corrected ml/min/mmHg 15.61    DLCO  COR %Predicted % 52    DLVA Predicted % 67  64   TLC L 6.42  6.73   TLC % Predicted % 82  86   RV % Predicted % 100  124   Personally reviewed interpreted as moderate fixed obstruction, significant bronchodilator response, lung volumes consistent with air trapping, DLCO severely reduced, DLCO improves corrected for alveolar volume  WALK:     08/26/2018    4:18 PM  SIX MIN WALK  2 Minute Oxygen  Saturation % 93 %  2 Minute HR 75  4 Minute Oxygen  Saturation % 95 %  4 Minute HR 79  6 Minute Oxygen  Saturation % 95 %  6 Minute HR 76    Imaging: Personally reviewed and as per EMR discussion this note   Lab Results: Personally reviewed CBC    Component Value Date/Time   WBC 6.5 06/11/2023 1846   RBC 5.32 06/11/2023 1846   HGB 14.8 06/11/2023 1846   HCT 45.7 06/11/2023 1846   HCT TEST REQUEST RECEIVED WITHOUT APPROPRIATE SPECIMEN 05/20/2017 0225   PLT 195 06/11/2023 1846   MCV 85.9 06/11/2023 1846   MCH 27.8  06/11/2023 1846   MCHC 32.4 06/11/2023 1846   RDW 14.7 06/11/2023 1846   LYMPHSABS 2.8 03/03/2022 0004   MONOABS 0.3 03/03/2022 0004   EOSABS 0.2 03/03/2022 0004   BASOSABS 0.0 03/03/2022 0004    BMET    Component Value Date/Time   NA 141 06/11/2023 1846   K 4.2 06/11/2023 1846   CL 106 06/11/2023 1846   CO2 26 06/11/2023 1846   GLUCOSE 187 (H) 06/11/2023 1846   BUN 15 06/11/2023 1846   CREATININE 0.93 06/11/2023 1846   CREATININE 0.96 03/14/2019 1046   CALCIUM  10.5 (H) 06/11/2023 1846   GFRNONAA >60 06/11/2023 1846   GFRAA >60 09/07/2020 1156    BNP    Component Value Date/Time   BNP 8.7 06/11/2023 1852    ProBNP No results found for: PROBNP  Specialty Problems       Pulmonary Problems   COPD mixed type (HCC)   Obstructive sleep apnea    Allergies  Allergen Reactions   Penicillins Swelling    General swelling Tolerated cefepime  & ceftriaxone  previously. Has patient had a PCN reaction causing immediate rash, facial/tongue/throat swelling, SOB or lightheadedness with hypotension: Yes Has patient had a PCN reaction causing severe rash involving mucus membranes or skin necrosis: Unk Has patient had a PCN reaction that required hospitalization: Yes Has patient had a PCN reaction occurring within the last 10 years: No If all of the above answers are NO, then may proceed with Cephalosporin   Percocet [Oxycodone -Acetaminophen ]     Itching   Levaquin  [Levofloxacin  In D5w] Hives    Immunization History  Administered Date(s) Administered    sv, Bivalent, Protein Subunit Rsvpref,pf (Abrysvo) 05/26/2023   Fluad Quad(high Dose 65+) 01/17/2022   Influenza Split 10/05/2016   Influenza, High Dose Seasonal PF 01/19/2023   Influenza,inj,Quad PF,6+ Mos 10/04/2015, 08/29/2016, 11/17/2017, 09/08/2019, 08/30/2020   Influenza,inj,quad, With Preservative 03/10/2019   Influenza-Unspecified 09/14/2017   Moderna Covid-19 Fall Seasonal Vaccine 63yrs & older 02/25/2024    Moderna Covid-19 Vaccine Bivalent Booster 18yrs & up 01/17/2022   Moderna Sars-Covid-2 Vaccination 06/12/2021   PFIZER(Purple Top)SARS-COV-2 Vaccination 02/14/2020, 03/06/2020, 09/25/2020   Pfizer(Comirnaty)Fall Seasonal Vaccine 12 years and older 01/19/2023   Pneumococcal Conjugate-13 11/17/2016   Pneumococcal Polysaccharide-23 03/30/2017, 04/14/2017   Tdap 11/17/2016, 03/30/2017   Zoster Recombinant(Shingrix) 06/02/2019, 10/07/2019    Past Medical  History:  Diagnosis Date   Aneurysm of infrarenal abdominal aorta (HCC)    CT 11-23-2018 , 3.2cm   Anxiety    Benign localized prostatic hyperplasia with lower urinary tract symptoms (LUTS)    Cancer (HCC)    kidney cancer   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   CAP (community acquired pneumonia) 11/23/2018   per pt had follow up by pcp at [facility] with CXR done after christmas   Chronic insomnia    COPD with emphysema (HCC)    Coronary artery disease    followed by cardiologist @ [facility]---  per cardiac cath 07-11-2015 mild plaquing of the CFx and RCA, normal LVF [facility] [place] copy in epic)   Dementia [facility])    some per wife    Depression    Diabetes mellitus without complication (HCC)    type 2    Diverticulosis of colon    GERD (gastroesophageal reflux disease)    Glaucoma    Hematuria    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 2001   History of urinary retention    Hyperlipidemia    Hypertension    Incomplete emptying of bladder    Left-sided weakness 08/27/2016   CVA residual   Myocardial infarction [facility]) 2015   Neuromuscular disorder (HCC)    neuropathy feet and hands   OA (osteoarthritis)     knees, feet, wrists, back   Pacemaker    St. Jude   Paroxysmal atrial tachycardia (HCC)    Polysubstance abuse (HCC)    per pt from 2001 to 2016 has had couple of relapses since--  12-31-2018 per pt last relapse with cocaine approx. Oct 2019   Renal mass, left    pelvic   Retinal vein occlusion 01/2016   right eye, secondary to hypertension   S/P placement of cardiac pacemaker 12/11/2014   St Jude dual chamber (followed by Bonni LIEN)   Seizures [facility])    wife reported last one  2 WEEKS AGO AS OF 07-29-2019   Sepsis (HCC) 02/2019   WENT HOME ON IV MEDS FOR 2  1/2 WEEKS   Shortness of breath    WITH ACTIVITY   Sleep apnea    does not tolerate cpap   SSS (sick sinus syndrome) (HCC)    treatment pacemaker placement   Stroke (HCC) 2017 OR 2018   had therapy  slow movement now ( 02/01/2019)  right side weaker   Symptomatic bradycardia    Syncope    Tremor, essential 03/31/2016   Tuberculosis    1990s    Wears glasses    Wears hearing aid in both ears     Tobacco History: Social History   Tobacco Use  Smoking Status Every Day   Current packs/day: 0.50   Average packs/day: 0.5 packs/day for 40.0 years (20.0 ttl pk-yrs)   Types: Cigarettes  Smokeless Tobacco Never  Tobacco Comments   7 cigarettes per day 08/04/24   Ready to quit: Not Answered Counseling given: Not Answered Tobacco comments: 7 cigarettes per day 08/04/24   Continue to not smoke  Outpatient Encounter Medications as of 08/04/2024  Medication  Sig   acetaminophen  (TYLENOL ) 500 MG tablet Take 1,000 mg by mouth every 6 (six) hours as needed for moderate pain or headache.   albuterol  (VENTOLIN  HFA) 108 (90 Base) MCG/ACT inhaler Inhale 2 puffs into the lungs every 6 (six) hours as needed for wheezing or shortness of breath.   aspirin  EC 325 MG tablet Take 325 mg by mouth daily.   atorvastatin  (LIPITOR ) 20 MG tablet Take 20 mg by mouth daily.   azithromycin  (ZITHROMAX ) 250 MG tablet Take 2 tablets (500 mg  total) by mouth daily for 1 day, THEN 1 tablet (250 mg total) daily for 4 days.   Blood Glucose Monitoring Suppl (ACCU-CHEK AVIVA PLUS) w/Device KIT 1 Device by Does not apply route 4 (four) times daily.   Brinzolamide -Brimonidine  1-0.2 % SUSP Place 1 drop into both eyes 3 (three) times daily.   budeson-glycopyrrolate-formoterol  (BREZTRI  AEROSPHERE) 160-9-4.8 MCG/ACT AERO inhaler Inhale 2 puffs into the lungs in the morning and at bedtime.   carbidopa -levodopa  (SINEMET  IR) 25-100 MG tablet Take 1 tablet by mouth 3 (three) times daily.   carboxymethylcellulose (REFRESH PLUS) 0.5 % SOLN Place 1 drop into both eyes every 4 (four) hours as needed (for irritation).   cetirizine  (ZYRTEC ) 10 MG tablet Take 1 tablet (10 mg total) by mouth daily. (Patient taking differently: Take 10 mg by mouth daily as needed for allergies.)   Cholecalciferol  (VITAMIN D3) 50 MCG (2000 UT) TABS Take 4,000 Units by mouth daily.   diclofenac  sodium (VOLTAREN ) 1 % GEL Apply 4 g topically 4 (four) times daily as needed (pain.).    dicyclomine  (BENTYL ) 20 MG tablet Take 0.5 tablets (10 mg total) by mouth 2 (two) times daily as needed for up to 5 days for spasms.   doxycycline  (VIBRA -TABS) 100 MG tablet Take 1 tablet (100 mg total) by mouth 2 (two) times daily.   famotidine  (PEPCID ) 40 MG tablet Take 40 mg by mouth at bedtime.   finasteride  (PROSCAR ) 5 MG tablet Take 5 mg by mouth daily.    fluticasone  (FLONASE ) 50 MCG/ACT nasal spray Place 1 spray into both nostrils daily.   gabapentin  (NEURONTIN ) 400 MG capsule Take 400 mg by mouth 3 (three) times daily.    glucose blood (ACCU-CHEK AVIVA) test strip Use as instructed (Patient taking differently: 4 (four) times daily -  before meals and at bedtime.)   insulin  aspart protamine - aspart (NOVOLOG  70/30 MIX) (70-30) 100 UNIT/ML FlexPen Inject 30 Units into the skin 2 (two) times daily with a meal.   latanoprost  (XALATAN ) 0.005 % ophthalmic solution Place 1 drop into both eyes at  bedtime.    levETIRAcetam  (KEPPRA ) 500 MG tablet Take 1,000 mg by mouth 2 (two) times daily.   metFORMIN  (GLUCOPHAGE ) 1000 MG tablet Take 1,000 mg by mouth 2 (two) times daily.   methazolamide  (NEPTAZANE ) 50 MG tablet Take 1 tablet (50 mg total) by mouth daily.   mirabegron  ER (MYRBETRIQ ) 50 MG TB24 tablet Take 50 mg by mouth daily.   mirtazapine  (REMERON ) 15 MG tablet Take 15 mg by mouth at bedtime.   mometasone  (ASMANEX ) 220 MCG/ACT inhaler Inhale 2 puffs into the lungs daily. At night   Netarsudil  Dimesylate 0.02 % SOLN Place 1 drop into both eyes daily.   predniSONE  (DELTASONE ) 20 MG tablet Take 2 tablets (40 mg total) by mouth daily with breakfast for 5 days.   promethazine  (PHENERGAN ) 25 MG tablet Take 12.5 mg by mouth 3 (three) times daily as needed for nausea or vomiting.  psyllium (METAMUCIL SMOOTH TEXTURE) 28 % packet Take 1 packet by mouth daily as needed (for constipation).   Semaglutide  (OZEMPIC , 0.25 OR 0.5 MG/DOSE, [place]) Inject 0.5 mg into the skin every Friday.   senna (SENOKOT) 8.6 MG TABS tablet Take 1 tablet by mouth at bedtime.   sucralfate  (CARAFATE ) 1 GM/10ML suspension Take 10 mLs (1 g total) by mouth 4 (four) times daily -  with meals and at bedtime. (Patient taking differently: Take 1 g by mouth 4 (four) times daily as needed (stomach pain).)   tamsulosin  (FLOMAX ) 0.4 MG CAPS capsule Take 1 capsule (0.4 mg total) by mouth daily after supper. (Patient taking differently: Take 0.4 mg by mouth daily.)   venlafaxine  XR (EFFEXOR -XR) 75 MG 24 hr capsule Take 225 mg by mouth daily with breakfast.   vitamin B-12 (CYANOCOBALAMIN ) 500 MCG tablet Take 500 mcg by mouth daily.   nicotine  (NICODERM CQ  - DOSED IN MG/24 HR) 7 mg/24hr patch Place 7 mg onto the skin daily. (Patient not taking: Reported on 08/04/2024)   nicotine  polacrilex (NICORETTE) 2 MG gum Take 2 mg by mouth 3 (three) times daily as needed for smoking cessation. (Patient not taking: Reported on 08/04/2024)   pantoprazole   (PROTONIX ) 40 MG tablet Take 1 tablet (40 mg total) by mouth daily. (Patient not taking: Reported on 08/04/2024)   No facility-administered encounter medications on file as of 08/04/2024.     Review of Systems  Review of Systems  N/a Physical Exam  BP 125/77   Pulse 78   Temp (!) 97.4 F (36.3 C)   Ht 6' 2 (1.88 m)   Wt 203 lb 3.2 oz (92.2 kg)   SpO2 96% Comment: RA  BMI 26.09 kg/m   Wt Readings from Last 5 Encounters:  08/04/24 203 lb 3.2 oz (92.2 kg)  04/11/24 207 lb 9.6 oz (94.2 kg)  08/19/23 213 lb (96.6 kg)  08/03/23 223 lb 12.8 oz (101.5 kg)  01/14/23 209 lb 3.2 oz (94.9 kg)    BMI Readings from Last 5 Encounters:  08/04/24 26.09 kg/m  04/11/24 26.65 kg/m  08/19/23 27.35 kg/m  08/03/23 28.73 kg/m  01/14/23 26.86 kg/m     Physical Exam General: Sitting in chair, no acute distress Eyes: EOMI, icterus Neck: Supple, no JVP Pulmonary: Distant, no wheeze Cardiovascular: Regular rate and rhythm, no murmur Abdomen: Nondistended, bowel sounds present MSK: No synovitis, joint effusion Neuro: Normal gait, no weakness Psych: Normal mood, full affect   Assessment & Plan:   COPD: As demonstrated on prior PFTs.  Gold D.  Historically does not frequently exacerbate but with prolonged and worsening symptoms summer 2023 with exacerbation.  Continue Breztri .  Refill prescription today.  Residual dyspnea on exertion.  Increased cough and worsening shortness of breath albeit mild over the last few weeks.  Prednisone  40 mg daily and a Z-Pak sent.  Will reach out to pharmacy if we can get Ohtuvayre  approved, there was a cost issue and has not started this yet.  Tobacco abuse: Currently up-to-date with lung cancer screening, most recent scan 03/2024 lung RADS 2.  Recommend continued lung cancer screening.  After shared decision-making using a decision made, patient agrees to continue on lung cancer screening and prefer to do it here instead of the [place].  Continue lung cancer  screening 03/2025, CT scan ordered.    Return in about 6 months (around 02/04/2025) for f/u Dr. Annella.   Wesley JONELLE Annella, MD 08/04/2024

## 2024-08-04 NOTE — Patient Instructions (Signed)
 No change in the medicines  Take azithromycin  as prescribed and prednisone  as prescribed to help with the congestion and worsening shortness of breath  I will send a message to our pulmonary team to follow back up on the new nebulizer medicine, Ohtuvayre   Return to clinic in 6 months or sooner if needed with Dr. Annella

## 2024-09-16 NOTE — Telephone Encounter (Signed)
 Patients wife called wanting to know if its time for another colonoscopy .   Call back [phone]

## 2024-09-19 NOTE — Telephone Encounter (Signed)
 Per Colon dated 05/01/2022: Recommendation 7 years  Contacted pts wife (on HIPAA.)  Verified pts name and DOB.  Wife states pt stays bloated all the time.  Pt scheduled for 03/31/2024 at 1100 (added to wait list.)  Wife aware of appt details.  Wife states pt needs a [facility] authorization.

## 2024-09-26 NOTE — Telephone Encounter (Addendum)
 Prepared [facility] RFS.  Successfully Rightfaxed RFS to fax # [phone].

## 2024-10-19 ENCOUNTER — Encounter: Payer: Self-pay | Admitting: Family

## 2024-10-19 ENCOUNTER — Other Ambulatory Visit: Payer: Self-pay

## 2024-10-19 ENCOUNTER — Other Ambulatory Visit (HOSPITAL_COMMUNITY)
Admission: EM | Admit: 2024-10-19 | Discharge: 2024-10-19 | Disposition: A | Discharge status: Other Acute Inpatient Hospital | Attending: Psychiatry | Admitting: Psychiatry

## 2024-10-19 ENCOUNTER — Inpatient Hospital Stay
Admission: AD | Admit: 2024-10-19 | Discharge: 2024-10-27 | DRG: 885 | Disposition: A | Discharge status: Home / Self Care | Source: Intra-hospital | Attending: Psychiatry | Admitting: Psychiatry

## 2024-10-19 DIAGNOSIS — Z794 Long term (current) use of insulin: Secondary | ICD-10-CM

## 2024-10-19 DIAGNOSIS — F332 Major depressive disorder, recurrent severe without psychotic features: Secondary | ICD-10-CM | POA: Diagnosis not present

## 2024-10-19 DIAGNOSIS — F1410 Cocaine abuse, uncomplicated: Secondary | ICD-10-CM | POA: Diagnosis not present

## 2024-10-19 DIAGNOSIS — F17210 Nicotine dependence, cigarettes, uncomplicated: Secondary | ICD-10-CM | POA: Diagnosis present

## 2024-10-19 DIAGNOSIS — Z9151 Personal history of suicidal behavior: Secondary | ICD-10-CM

## 2024-10-19 DIAGNOSIS — E119 Type 2 diabetes mellitus without complications: Secondary | ICD-10-CM | POA: Insufficient documentation

## 2024-10-19 DIAGNOSIS — Z880 Allergy status to penicillin: Secondary | ICD-10-CM

## 2024-10-19 DIAGNOSIS — F4310 Post-traumatic stress disorder, unspecified: Secondary | ICD-10-CM | POA: Diagnosis present

## 2024-10-19 DIAGNOSIS — Z7984 Long term (current) use of oral hypoglycemic drugs: Secondary | ICD-10-CM

## 2024-10-19 DIAGNOSIS — Z8673 Personal history of transient ischemic attack (TIA), and cerebral infarction without residual deficits: Secondary | ICD-10-CM

## 2024-10-19 DIAGNOSIS — G8929 Other chronic pain: Secondary | ICD-10-CM | POA: Diagnosis present

## 2024-10-19 DIAGNOSIS — Z83511 Family history of glaucoma: Secondary | ICD-10-CM

## 2024-10-19 DIAGNOSIS — Z885 Allergy status to narcotic agent status: Secondary | ICD-10-CM

## 2024-10-19 DIAGNOSIS — Z800 Family history of malignant neoplasm of digestive organs: Secondary | ICD-10-CM

## 2024-10-19 DIAGNOSIS — Z9842 Cataract extraction status, left eye: Secondary | ICD-10-CM

## 2024-10-19 DIAGNOSIS — F1490 Cocaine use, unspecified, uncomplicated: Secondary | ICD-10-CM | POA: Diagnosis present

## 2024-10-19 DIAGNOSIS — Z7985 Long-term (current) use of injectable non-insulin antidiabetic drugs: Secondary | ICD-10-CM

## 2024-10-19 DIAGNOSIS — Z7951 Long term (current) use of inhaled steroids: Secondary | ICD-10-CM

## 2024-10-19 DIAGNOSIS — Z9841 Cataract extraction status, right eye: Secondary | ICD-10-CM

## 2024-10-19 DIAGNOSIS — Z87440 Personal history of urinary (tract) infections: Secondary | ICD-10-CM

## 2024-10-19 DIAGNOSIS — Z79899 Other long term (current) drug therapy: Secondary | ICD-10-CM

## 2024-10-19 DIAGNOSIS — I2510 Atherosclerotic heart disease of native coronary artery without angina pectoris: Secondary | ICD-10-CM | POA: Diagnosis present

## 2024-10-19 DIAGNOSIS — I252 Old myocardial infarction: Secondary | ICD-10-CM

## 2024-10-19 DIAGNOSIS — G20A1 Parkinson's disease without dyskinesia, without mention of fluctuations: Secondary | ICD-10-CM | POA: Diagnosis present

## 2024-10-19 DIAGNOSIS — Z950 Presence of cardiac pacemaker: Secondary | ICD-10-CM | POA: Insufficient documentation

## 2024-10-19 DIAGNOSIS — Z9152 Personal history of nonsuicidal self-harm: Secondary | ICD-10-CM

## 2024-10-19 DIAGNOSIS — I10 Essential (primary) hypertension: Secondary | ICD-10-CM | POA: Diagnosis present

## 2024-10-19 DIAGNOSIS — Z91410 Personal history of adult physical and sexual abuse: Secondary | ICD-10-CM

## 2024-10-19 DIAGNOSIS — J439 Emphysema, unspecified: Secondary | ICD-10-CM | POA: Diagnosis present

## 2024-10-19 DIAGNOSIS — F411 Generalized anxiety disorder: Secondary | ICD-10-CM | POA: Insufficient documentation

## 2024-10-19 DIAGNOSIS — E785 Hyperlipidemia, unspecified: Secondary | ICD-10-CM | POA: Diagnosis present

## 2024-10-19 DIAGNOSIS — Z809 Family history of malignant neoplasm, unspecified: Secondary | ICD-10-CM

## 2024-10-19 DIAGNOSIS — Z9079 Acquired absence of other genital organ(s): Secondary | ICD-10-CM

## 2024-10-19 DIAGNOSIS — Z8553 Personal history of malignant neoplasm of renal pelvis: Secondary | ICD-10-CM

## 2024-10-19 DIAGNOSIS — Z888 Allergy status to other drugs, medicaments and biological substances status: Secondary | ICD-10-CM

## 2024-10-19 DIAGNOSIS — Z974 Presence of external hearing-aid: Secondary | ICD-10-CM

## 2024-10-19 DIAGNOSIS — R443 Hallucinations, unspecified: Secondary | ICD-10-CM | POA: Diagnosis present

## 2024-10-19 DIAGNOSIS — Z961 Presence of intraocular lens: Secondary | ICD-10-CM | POA: Diagnosis present

## 2024-10-19 DIAGNOSIS — F1414 Cocaine abuse with cocaine-induced mood disorder: Secondary | ICD-10-CM | POA: Insufficient documentation

## 2024-10-19 LAB — POCT URINE DRUG SCREEN - MANUAL ENTRY (I-SCREEN)
POC Amphetamine UR: POSITIVE — AB
POC Buprenorphine (BUP): NOT DETECTED
POC Cocaine UR: POSITIVE — AB
POC Marijuana UR: POSITIVE — AB
POC Methadone UR: NOT DETECTED
POC Methamphetamine UR: NOT DETECTED
POC Morphine: NOT DETECTED
POC Oxazepam (BZO): POSITIVE — AB
POC Oxycodone UR: NOT DETECTED
POC Secobarbital (BAR): NOT DETECTED

## 2024-10-19 LAB — COMPREHENSIVE METABOLIC PANEL WITH GFR
ALT: 13 U/L (ref 0–44)
AST: 34 U/L (ref 15–41)
Albumin: 4.6 g/dL (ref 3.5–5.0)
Alkaline Phosphatase: 189 U/L — ABNORMAL HIGH (ref 38–126)
Anion gap: 14 (ref 5–15)
BUN: 14 mg/dL (ref 8–23)
CO2: 27 mmol/L (ref 22–32)
Calcium: 9.8 mg/dL (ref 8.9–10.3)
Chloride: 98 mmol/L (ref 98–111)
Creatinine, Ser: 0.91 mg/dL (ref 0.61–1.24)
GFR, Estimated: >60 mL/min (ref >60)
Glucose, Bld: 94 mg/dL (ref 70–99)
Potassium: 4 mmol/L (ref 3.5–5.1)
Sodium: 139 mmol/L (ref 135–145)
Total Bilirubin: 0.8 mg/dL (ref 0.0–1.2)
Total Protein: 8.2 g/dL — ABNORMAL HIGH (ref 6.5–8.1)

## 2024-10-19 LAB — CBC WITH DIFFERENTIAL/PLATELET
Abs Immature Granulocytes: 0.01 K/uL (ref 0.00–0.07)
Basophils Absolute: 0 K/uL (ref 0.0–0.1)
Basophils Relative: 0 %
Eosinophils Absolute: 0.1 K/uL (ref 0.0–0.5)
Eosinophils Relative: 1 %
HCT: 50.1 % (ref 39.0–52.0)
Hemoglobin: 16.1 g/dL (ref 13.0–17.0)
Immature Granulocytes: 0 %
Lymphocytes Relative: 33 %
Lymphs Abs: 2.4 K/uL (ref 0.7–4.0)
MCH: 27.3 pg (ref 26.0–34.0)
MCHC: 32.1 g/dL (ref 30.0–36.0)
MCV: 84.9 fL (ref 80.0–100.0)
Monocytes Absolute: 0.4 K/uL (ref 0.1–1.0)
Monocytes Relative: 6 %
Neutro Abs: 4.4 K/uL (ref 1.7–7.7)
Neutrophils Relative %: 60 %
Platelets: 203 K/uL (ref 150–400)
RBC: 5.9 MIL/uL — ABNORMAL HIGH (ref 4.22–5.81)
RDW: 14.1 % (ref 11.5–15.5)
WBC: 7.3 K/uL (ref 4.0–10.5)
nRBC: 0 % (ref 0.0–0.2)

## 2024-10-19 LAB — MAGNESIUM: Magnesium: 2.2 mg/dL (ref 1.7–2.4)

## 2024-10-19 LAB — GLUCOSE, CAPILLARY
Glucose-Capillary: 133 mg/dL — ABNORMAL HIGH (ref 70–99)
Glucose-Capillary: 70 mg/dL (ref 70–99)
Glucose-Capillary: 80 mg/dL (ref 70–99)

## 2024-10-19 LAB — ETHANOL: Alcohol, Ethyl (B): <15 mg/dL (ref <15)

## 2024-10-19 LAB — TSH: TSH: 1.451 u[IU]/mL (ref 0.350–4.500)

## 2024-10-19 LAB — POC SARS CORONAVIRUS 2 AG: SARSCOV2ONAVIRUS 2 AG: NEGATIVE

## 2024-10-19 MED ORDER — BRIMONIDINE TARTRATE 0.2 % OP SOLN
1.0000 [drp] | Freq: Three times a day (TID) | OPHTHALMIC | Status: DC
  Administered 2024-10-19 – 2024-10-27 (×23): 1 [drp] via OPHTHALMIC
  Filled 2024-10-19: qty 5

## 2024-10-19 MED ORDER — ATORVASTATIN CALCIUM 10 MG PO TABS
20.0000 mg | ORAL_TABLET | Freq: Every day | ORAL | Status: DC
  Administered 2024-10-19 – 2024-10-26 (×8): 20 mg via ORAL
  Filled 2024-10-19 (×8): qty 2

## 2024-10-19 MED ORDER — ALBUTEROL SULFATE (2.5 MG/3ML) 0.083% IN NEBU
2.5000 mg | INHALATION_SOLUTION | Freq: Two times a day (BID) | RESPIRATORY_TRACT | Status: DC
  Administered 2024-10-24 – 2024-10-26 (×4): 2.5 mg via RESPIRATORY_TRACT
  Filled 2024-10-19 (×16): qty 3

## 2024-10-19 MED ORDER — ALUM & MAG HYDROXIDE-SIMETH 200-200-20 MG/5ML PO SUSP: 30.0000 mL | ORAL | Status: DC | PRN

## 2024-10-19 MED ORDER — LOSARTAN POTASSIUM 25 MG PO TABS
25.0000 mg | ORAL_TABLET | Freq: Every day | ORAL | Status: DC
Start: 2024-10-19 — End: 2024-10-27
  Administered 2024-10-19 – 2024-10-27 (×9): 25 mg via ORAL
  Filled 2024-10-19 (×9): qty 1

## 2024-10-19 MED ORDER — OLANZAPINE 10 MG IM SOLR: 5.0000 mg | Freq: Three times a day (TID) | INTRAMUSCULAR | Status: DC | PRN

## 2024-10-19 MED ORDER — CETIRIZINE HCL 10 MG PO TABS
10.0000 mg | ORAL_TABLET | Freq: Every day | ORAL | Status: DC
  Administered 2024-10-19 – 2024-10-26 (×8): 10 mg via ORAL
  Filled 2024-10-19 (×8): qty 1

## 2024-10-19 MED ORDER — INSULIN GLARGINE-YFGN 100 UNIT/ML ~~LOC~~ SOLN
30.0000 [IU] | Freq: Every day | SUBCUTANEOUS | Status: DC
  Administered 2024-10-19: 30 [IU] via SUBCUTANEOUS
  Filled 2024-10-19 (×2): qty 0.3

## 2024-10-19 MED ORDER — VITAMIN D3 25 MCG (1000 UNIT) PO TABS
4000.0000 [IU] | ORAL_TABLET | Freq: Every day | ORAL | Status: DC
  Administered 2024-10-19 – 2024-10-27 (×9): 4000 [IU] via ORAL
  Filled 2024-10-19 (×17): qty 4

## 2024-10-19 MED ORDER — ALBUTEROL SULFATE HFA 108 (90 BASE) MCG/ACT IN AERS: 2.0000 | INHALATION_SPRAY | Freq: Four times a day (QID) | RESPIRATORY_TRACT | Status: DC | PRN

## 2024-10-19 MED ORDER — LOSARTAN POTASSIUM 25 MG PO TABS: 25.0000 mg | ORAL_TABLET | Freq: Every day | ORAL | Status: DC

## 2024-10-19 MED ORDER — CARBIDOPA-LEVODOPA 25-100 MG PO TABS
1.0000 | ORAL_TABLET | Freq: Three times a day (TID) | ORAL | Status: DC
  Administered 2024-10-19 – 2024-10-27 (×24): 1 via ORAL
  Filled 2024-10-19 (×25): qty 1

## 2024-10-19 MED ORDER — INSULIN ASPART 100 UNIT/ML IJ SOLN
10.0000 [IU] | Freq: Two times a day (BID) | INTRAMUSCULAR | Status: DC
  Administered 2024-10-19: 10 [IU] via SUBCUTANEOUS
  Filled 2024-10-19: qty 1

## 2024-10-19 MED ORDER — MAGNESIUM OXIDE -MG SUPPLEMENT 400 (240 MG) MG PO TABS
400.0000 mg | ORAL_TABLET | Freq: Every day | ORAL | Status: DC
  Administered 2024-10-19 – 2024-10-27 (×9): 400 mg via ORAL
  Filled 2024-10-19 (×9): qty 1

## 2024-10-19 MED ORDER — MAGNESIUM HYDROXIDE 400 MG/5ML PO SUSP: 30.0000 mL | Freq: Every day | ORAL | Status: DC | PRN

## 2024-10-19 MED ORDER — METFORMIN HCL 500 MG PO TABS
1000.0000 mg | ORAL_TABLET | Freq: Two times a day (BID) | ORAL | Status: DC
  Administered 2024-10-19 – 2024-10-27 (×16): 1000 mg via ORAL
  Filled 2024-10-19 (×17): qty 2

## 2024-10-19 MED ORDER — OLANZAPINE 5 MG PO TBDP: 5.0000 mg | ORAL_TABLET | Freq: Three times a day (TID) | ORAL | Status: DC | PRN

## 2024-10-19 MED ORDER — BUDESON-GLYCOPYRROL-FORMOTEROL 160-9-4.8 MCG/ACT IN AERO
2.0000 | INHALATION_SPRAY | Freq: Every day | RESPIRATORY_TRACT | Status: DC
  Administered 2024-10-20 – 2024-10-27 (×8): 2 via RESPIRATORY_TRACT
  Filled 2024-10-19: qty 5.9

## 2024-10-19 MED ORDER — VENLAFAXINE HCL 37.5 MG PO TABS
75.0000 mg | ORAL_TABLET | Freq: Three times a day (TID) | ORAL | Status: DC
  Administered 2024-10-19 – 2024-10-21 (×7): 75 mg via ORAL
  Filled 2024-10-19 (×7): qty 2

## 2024-10-19 MED ORDER — BRINZOLAMIDE 1 % OP SUSP
1.0000 [drp] | Freq: Three times a day (TID) | OPHTHALMIC | Status: DC
  Administered 2024-10-19 – 2024-10-27 (×23): 1 [drp] via OPHTHALMIC
  Filled 2024-10-19: qty 10

## 2024-10-19 MED ORDER — LATANOPROST 0.005 % OP SOLN
1.0000 [drp] | Freq: Every day | OPHTHALMIC | Status: DC
  Administered 2024-10-19 – 2024-10-26 (×8): 1 [drp] via OPHTHALMIC
  Filled 2024-10-19 (×2): qty 2.5

## 2024-10-19 MED ORDER — FINASTERIDE 5 MG PO TABS
5.0000 mg | ORAL_TABLET | Freq: Every day | ORAL | Status: DC
  Administered 2024-10-19 – 2024-10-27 (×9): 5 mg via ORAL
  Filled 2024-10-19 (×9): qty 1

## 2024-10-19 MED ORDER — MIRABEGRON ER 50 MG PO TB24: 50.0000 mg | ORAL_TABLET | Freq: Every day | ORAL | Status: DC | PRN

## 2024-10-19 MED ORDER — CYANOCOBALAMIN 500 MCG PO TABS
500.0000 ug | ORAL_TABLET | Freq: Every day | ORAL | Status: DC
  Administered 2024-10-20 – 2024-10-27 (×8): 500 ug via ORAL
  Filled 2024-10-19 (×8): qty 1

## 2024-10-19 MED ORDER — MIRTAZAPINE 15 MG PO TABS
15.0000 mg | ORAL_TABLET | Freq: Every day | ORAL | Status: DC
  Administered 2024-10-19 – 2024-10-26 (×8): 15 mg via ORAL
  Filled 2024-10-19 (×8): qty 1

## 2024-10-19 MED ORDER — PILOCARPINE HCL 1 % OP SOLN
1.0000 [drp] | Freq: Two times a day (BID) | OPHTHALMIC | Status: DC
  Administered 2024-10-19 – 2024-10-27 (×16): 1 [drp] via OPHTHALMIC
  Filled 2024-10-19: qty 15

## 2024-10-19 MED ORDER — TAMSULOSIN HCL 0.4 MG PO CAPS
0.4000 mg | ORAL_CAPSULE | Freq: Every day | ORAL | Status: DC
  Administered 2024-10-19 – 2024-10-26 (×8): 0.4 mg via ORAL
  Filled 2024-10-19 (×8): qty 1

## 2024-10-19 MED ORDER — DOCUSATE SODIUM 100 MG PO CAPS
100.0000 mg | ORAL_CAPSULE | Freq: Two times a day (BID) | ORAL | Status: DC
  Administered 2024-10-19 – 2024-10-27 (×16): 100 mg via ORAL
  Filled 2024-10-19 (×16): qty 1

## 2024-10-19 MED ORDER — TIRZEPATIDE-WEIGHT MANAGEMENT 5 MG/0.5ML ~~LOC~~ SOAJ: 5.0000 mg | SUBCUTANEOUS | Status: DC

## 2024-10-19 MED ORDER — LEVETIRACETAM 500 MG PO TABS
1000.0000 mg | ORAL_TABLET | Freq: Two times a day (BID) | ORAL | Status: DC
  Administered 2024-10-19 – 2024-10-27 (×16): 1000 mg via ORAL
  Filled 2024-10-19 (×16): qty 2

## 2024-10-19 NOTE — ED Notes (Signed)
 Sent in labs two lavender tubes, two yellow tubes, one dark green tube, one light green tube, one red tube, with patient's label on it and Order Requisition.

## 2024-10-19 NOTE — Plan of Care (Signed)
  Problem: Self-Concept: Goal: Ability to identify factors that promote anxiety will improve Outcome: Progressing Goal: Level of anxiety will decrease Outcome: Progressing Goal: Ability to modify response to factors that promote anxiety will improve Outcome: Progressing   

## 2024-10-19 NOTE — Progress Notes (Signed)
 At approx 2000, call by tech stating something was wrong with the patient and reporting he's was sweating profusely. Upon nursing assessment, patient found in bed diaphoretic with a blank stare and mildly confused. Pt states I think my sugar dropped. CBG 70. OJ and snacks offered and accepted. NP notified. States recheck BS.  2030 AAOX4. Reports feeling better. CBG 80. No s/s of hyperglycemia/hypoglycemia noted. No s/s of acute distress noted. No c/o pain/discomfort noted.

## 2024-10-19 NOTE — ED Notes (Signed)
 Nurse to nurse report given to [place], RN @ [facility]. Safe transport services called for transport. Pt made aware and agree to transport.

## 2024-10-19 NOTE — ED Notes (Signed)
 Pt admitted to observation unit pending transfer to BMU after labs result. Pt admitted requesting detox from crack/cocaine. Pt presents ambulating with cane and states he is unsteady with ambulation without his walker. Requeires CPAP at night. Pacemaker noted to L upper clavicle area. Pt denies SI/HI/AVH. Calm, cooperative throughout interview process. Skin assessment completed. Oriented to unit. Meal and drink offered. Pt verbally contract for safety. Will monitor for safety.
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
## 2024-10-19 NOTE — ED Notes (Addendum)
 Patient A&O x 4, ambulatory. Patient transferred to [facility] via safe transport in no acute distress. Patient denied SI/HI, A/VH upon discharge. Pt belongings given to safe transport driver from locker #68 intact. Patient escorted to [place] via staff for transport to destination. Safety maintained.

## 2024-10-19 NOTE — ED Provider Notes (Signed)
 Behavioral Health Urgent Care Medical Screening Exam  Patient Name: Wesley [name] MRN: [id number] Date of Evaluation: 10/19/24 Chief Complaint:  MDD, cocaine use disorder Diagnosis:  Final diagnoses:  Cocaine use disorder (HCC)  MDD (major depressive disorder), recurrent severe, without psychosis (HCC)    History of Present illness: Wesley [name] is a 68 y.o. male. Presents voluntarily to [facility] behavioral health for walk-in assessment.  Patient prefers to be called Wesley [name].  Patient is accompanied by his wife, Azai Gaffin phone [phone].  Patient prefers that his wife remain present during assessment.   Patient is seated in assessment office.  He is alert and oriented, pleasant and cooperative during assessment.  He presents with depressed mood, congruent affect.   Wesley [name] states I have a problem with cocaine, I want to do something, to get into somewhere to help with my cocaine.   Patient endorses cocaine use, uses cocaine approximately 2 times per month via smoking.  Most recent cocaine use two days ago. Restarted cocaine use,  after 2 years in recovery, approximately 6 months ago.  Prior to 2 years ago patient used cocaine daily. THC use approximately once per month.  Alcohol  use approximately once per month.   Velinda denies suicidal and homicidal ideations currently.  Reports suicidal thoughts on yesterday.  Patient states I thought it may be a way out but it is not, I just need to get some help.  Patient endorses 2 previous suicide attempts, most recent attempt 10 years ago when he ingested an intentional overdose. Psychiatric diagnoses include PTSD, generalized anxiety disorder and major depressive disorder.  Patient is followed by [facility] psychiatrist, Dr. Ronal Pouch.  He is compliant with medications, unable to recall medications at this time.  Patient and wife confirm plan to provide updated list of all home medications.  Patient denies history of inpatient psychiatric  hospitalization.  Family mental health history includes patient's father diagnoses included alcohol  use disorder.  Patient is not linked with individual counseling.  Previously linked with men's group at [facility], group ended.  Outpatient psychiatry team at [facility] seeking counseling options.  Patient endorses history of auditory hallucinations.  Reports history of hearing stuff.  Initial hallucinations began after an injury to his head approximately 5 years ago.  Most recent episode auditory hallucinations approximately 3 weeks ago.  Patient is unable to elaborate regarding content of hallucinations.  He denies visual hallucinations.  There is no evidence of delusional thought content and no indication that patient is responding to internal stimuli.   Patient resides in [place] with his wife.  He denies access to weapons.  He receives disability income.  He endorses average sleep and decreased appetite.  Wesley [name] endorses medical history including seizure disorder, glaucoma, COPD, emphysema, hyperlipidemia, history of CVA, type 2 diabetes mellitus with obesity, obstructive sleep apnea, chronic insomnia, syncope. Patient reports he is compliant with medications and requires cane/ambulation and CPAP//sleep.  Flowsheet Row ED from 10/19/2024 in [facility] ED from 08/19/2023 in [facility] Emergency Department at [facility] ED from 06/11/2023 in [facility] Emergency Department at [facility]  C-SSRS RISK CATEGORY Low Risk No Risk No Risk    Psychiatric Specialty Exam  Presentation  General Appearance:Appropriate for Environment; Casual  Eye Contact:Fair  Speech:Clear and Coherent; Normal Rate  Speech Volume:Normal  Handedness:Right   Mood and Affect  Mood: Depressed  Affect: Depressed   Thought Process  Thought Processes: Coherent; Goal Directed; Linear  Descriptions of Associations:Intact  Orientation:Full (  Time, Place and  Person)  Thought Content:Logical; WDL    Hallucinations:None  Ideas of Reference:None  Suicidal Thoughts:No  Homicidal Thoughts:No   Sensorium  Memory: Immediate Good; Recent Good  Judgment: Fair  Insight: Fair   Executive Functions  Concentration: Good  Attention Span: Good  Recall: Good  Fund of Knowledge: Fair  Language: Fair   Psychomotor Activity  Psychomotor Activity: Normal   Assets  Assets: Communication Skills; Desire for Improvement; Financial Resources/Insurance; Housing; Resilience; Social Support   Sleep  Sleep: Fair  Number of hours:  6   Physical Exam: Physical Exam Vitals and nursing note reviewed.  Constitutional:      Appearance: Normal appearance. He is well-developed.  HENT:     Head: Normocephalic.     Nose: Nose normal.  Cardiovascular:     Rate and Rhythm: Normal rate and regular rhythm.     Heart sounds: Normal heart sounds.  Pulmonary:     Effort: Pulmonary effort is normal.     Breath sounds: Normal breath sounds.  Musculoskeletal:        General: Normal range of motion.     Cervical back: Normal range of motion.  Skin:    General: Skin is warm and dry.  Neurological:     Mental Status: He is alert and oriented to person, place, and time.  Psychiatric:        Attention and Perception: Attention and perception normal.        Mood and Affect: Affect normal. Mood is depressed.        Speech: Speech normal.        Behavior: Behavior normal. Behavior is cooperative.        Thought Content: Thought content includes suicidal ideation.        Cognition and Memory: Cognition and memory normal.        Judgment: Judgment normal.    Review of Systems  Constitutional: Negative.   HENT: Negative.    Eyes: Negative.   Respiratory: Negative.    Cardiovascular: Negative.   Gastrointestinal: Negative.   Genitourinary: Negative.   Musculoskeletal: Negative.        Ambulates with cane  Skin: Negative.    Neurological: Negative.    Blood pressure 136/89, pulse 89, temperature 97.7 F (36.5 C), temperature source Oral, resp. rate 20, SpO2 99%. There is no height or weight on file to calculate BMI.  Musculoskeletal: Strength & Muscle Tone: within normal limits Gait & Station: ambulates with device/cane Patient leans: N/A   [facility] MSE Discharge Disposition for Follow up and Recommendations: Based on my evaluation I certify that psychiatric inpatient services furnished can reasonably be expected to improve the patient's condition which I recommend transfer to an appropriate accepting facility.   Patient remains voluntary.  He will be admitted to [facility] behavioral health continuous observation while awaiting inpatient psychiatric treatment.  Patient tentatively accepted to [facility].  Laboratory studies ordered including CBC, CMP, ethanol,  magnesium , prolactin and TSH.  Urine drug screen order initiated.  SARS/covid POC test ordered. EKG ordered.    Current medications: -Maalox 30 mL oral every 4 as needed/digestion -Magnesium  hydroxide 30 mL daily as needed/mild constipation  Agitation protocol: -Olanzapine 5 mg IM 3 times daily as needed agitation Or -Olanzapine 5 mg p.o. 3 times daily as needed/agitation    Ellouise LITTIE Dawn, FNP 10/19/2024, 2:04 PM
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
## 2024-10-20 DIAGNOSIS — F332 Major depressive disorder, recurrent severe without psychotic features: Principal | ICD-10-CM

## 2024-10-20 LAB — GLUCOSE, CAPILLARY
Glucose-Capillary: 110 mg/dL — ABNORMAL HIGH (ref 70–99)
Glucose-Capillary: 106 mg/dL — ABNORMAL HIGH (ref 70–99)
Glucose-Capillary: 118 mg/dL — ABNORMAL HIGH (ref 70–99)
Glucose-Capillary: 95 mg/dL (ref 70–99)

## 2024-10-20 LAB — PROLACTIN: Prolactin: 3.5 ng/mL — ABNORMAL LOW (ref 3.6–25.2)

## 2024-10-20 LAB — HEMOGLOBIN A1C
Hgb A1c MFr Bld: 6.7 % — ABNORMAL HIGH (ref 4.8–5.6)
Mean Plasma Glucose: 145.59 mg/dL

## 2024-10-20 MED ORDER — INSULIN ASPART 100 UNIT/ML IJ SOLN
0.0000 [IU] | Freq: Three times a day (TID) | INTRAMUSCULAR | Status: DC
  Administered 2024-10-21: 2 [IU] via SUBCUTANEOUS
  Administered 2024-10-25: 1 [IU] via SUBCUTANEOUS
  Administered 2024-10-25: 2 [IU] via SUBCUTANEOUS
  Administered 2024-10-26: 1 [IU] via SUBCUTANEOUS
  Administered 2024-10-26: 2 [IU] via SUBCUTANEOUS
  Filled 2024-10-20: qty 1
  Filled 2024-10-20: qty 2
  Filled 2024-10-20: qty 1

## 2024-10-20 MED ORDER — IBUPROFEN 200 MG PO TABS
400.0000 mg | ORAL_TABLET | Freq: Four times a day (QID) | ORAL | Status: AC | PRN
Start: 2024-10-20 — End: ?
  Administered 2024-10-20 – 2024-10-26 (×6): 400 mg via ORAL
  Filled 2024-10-20 (×6): qty 2

## 2024-10-20 MED ORDER — INSULIN ASPART 100 UNIT/ML IJ SOLN: 0.0000 [IU] | Freq: Every day | INTRAMUSCULAR | Status: DC

## 2024-10-20 MED ORDER — INSULIN ASPART 100 UNIT/ML IJ SOLN
7.0000 [IU] | Freq: Two times a day (BID) | INTRAMUSCULAR | Status: DC
  Administered 2024-10-20 – 2024-10-21 (×2): 7 [IU] via SUBCUTANEOUS
  Filled 2024-10-20: qty 1

## 2024-10-20 MED ORDER — INSULIN GLARGINE-YFGN 100 UNIT/ML ~~LOC~~ SOLN
25.0000 [IU] | Freq: Every day | SUBCUTANEOUS | Status: DC
  Administered 2024-10-20 – 2024-10-21 (×2): 25 [IU] via SUBCUTANEOUS
  Filled 2024-10-20 (×2): qty 0.25

## 2024-10-20 NOTE — Group Note (Signed)
 [facility] LCSW Group Therapy Note   Group Date: 10/20/2024 Start Time: 1315 End Time: 1345   Type of Therapy/Topic:  Group Therapy:  Emotion Regulation  Participation Level:  Did Not Attend   Mood:  Description of Group:    The purpose of this group is to assist patients in learning to regulate negative emotions and experience positive emotions. Patients will be guided to discuss ways in which they have been vulnerable to their negative emotions. These vulnerabilities will be juxtaposed with experiences of positive emotions or situations, and patients challenged to use positive emotions to combat negative ones. Special emphasis will be placed on coping with negative emotions in conflict situations, and patients will process healthy conflict resolution skills.  Therapeutic Goals: Patient will identify two positive emotions or experiences to reflect on in order to balance out negative emotions:  Patient will label two or more emotions that they find the most difficult to experience:  Patient will be able to demonstrate positive conflict resolution skills through discussion or role plays:   Summary of Patient Progress:   X    Therapeutic Modalities:   Cognitive Behavioral Therapy Feelings Identification Dialectical Behavioral Therapy   [name], LCSWA

## 2024-10-20 NOTE — Plan of Care (Signed)
  Problem: Education: Goal: Ability to state activities that reduce stress will improve Outcome: Not Progressing   Problem: Coping: Goal: Ability to identify and develop effective coping behavior will improve Outcome: Not Progressing   Problem: Self-Concept: Goal: Ability to identify factors that promote anxiety will improve Outcome: Not Progressing Goal: Level of anxiety will decrease Outcome: Not Progressing

## 2024-10-20 NOTE — Inpatient Diabetes Management (Signed)
 Inpatient Diabetes Program Recommendations  AACE/ADA: New Consensus Statement on Inpatient Glycemic Control (2015)  Target Ranges:  Prepandial:   less than 140 mg/dL      Peak postprandial:   less than 180 mg/dL (1-2 hours)      Critically ill patients:  140 - 180 mg/dL    Latest Reference Range & Units 10/19/24 16:47 10/19/24 19:50 10/19/24 20:26 10/20/24 07:36  Glucose-Capillary 70 - 99 mg/dL 866 (H)  10 units Novolog   30 units Semglee  @1738  70 80 110 (H)  (H): Data is abnormally high    Admit with: MDD, cocaine use disorder   History: DM2  Home DM Meds: Novolog  10 units BID with meals       Lantus  30 units daily       Metformin  1000 mg BID       Zepbound 5 mg Qweek  Current Orders: Novolog  10 units BID with meals       Lantus  30 units daily       Metformin  1000 mg BID    MD- please consider:  1. Reduce Semglee  to 25 units daily--please give reduced dose this afternoon  2. Start Novolog  Sensitive Correction Scale/ SSI (0-9 units) TID AC + HS  3. Reduce Novolog  Meal Coverage to 7 units BID with meals    --Will follow patient during hospitalization--  Adina Rudolpho Arrow RN, MSN, CDCES Diabetes Coordinator Inpatient Glycemic Control Team Team Pager: [phone] (8a-5p)

## 2024-10-20 NOTE — Progress Notes (Signed)
   10/20/24 1400  Psych Admission Type (Psych Patients Only)  Admission Status Voluntary  Psychosocial Assessment  Patient Complaints Anxiety;Depression  Eye Contact Fair  Facial Expression Sad  Affect Depressed  Speech Soft  Interaction Minimal  Motor Activity Unsteady  Appearance/Hygiene In scrubs  Behavior Characteristics Cooperative  Mood Sad  Thought Process  Coherency WDL  Content WDL  Delusions None reported or observed  Perception Hallucinations  Hallucination Auditory  Judgment Impaired  Confusion None  Danger to Self  Current suicidal ideation? Denies  Agreement Not to Harm Self Yes  Description of Agreement verbal  Danger to Others  Danger to Others None reported or observed

## 2024-10-20 NOTE — Group Note (Signed)
 Date:  10/20/2024 Time:  11:25 AM  Group Topic/Focus:  MOVEMENT THERAPY    Participation Level:  Minimal  Participation Quality:  minimal  Affect:  Flat  Cognitive:  none  Insight: None  Engagement in Group:  Limited  Modes of Intervention:  Activity  Additional Comments:  patient began group but then became un interested  Norleen SHAUNNA Bias 10/20/2024, 11:25 AM

## 2024-10-20 NOTE — Plan of Care (Signed)
   Problem: Activity: Goal: Interest or engagement in leisure activities will improve Outcome: Progressing Goal: Imbalance in normal sleep/wake cycle will improve Outcome: Progressing

## 2024-10-20 NOTE — Progress Notes (Signed)
 SI/HI: denies  Behavior/Mood: Calm and cooperative. Flat sad affect. Endorses anxiety and depression  Interaction/Group: isolative  Medications/PRN: po med compliant/No PRNs given  Pain: denies  Other: Pt is diabetic and will need a CBG order for glucose monitoring. Slept 10 hours   10/19/24 2100  Psych Admission Type (Psych Patients Only)  Admission Status Voluntary  Psychosocial Assessment  Patient Complaints Anxiety;Depression  Eye Contact Brief  Facial Expression Worried;Sad  Affect Depressed  Speech Soft  Interaction Isolative  Motor Activity Slow;Unsteady  Appearance/Hygiene In scrubs  Behavior Characteristics Cooperative  Mood Depressed  Thought Process  Coherency WDL  Content WDL  Delusions None reported or observed  Perception Hallucinations  Hallucination Auditory  Judgment Impaired  Confusion None  Danger to Self  Current suicidal ideation? Denies

## 2024-10-20 NOTE — [facility] Suicide Risk Assessment (Signed)
 [facility] Admission Suicide Risk Assessment   Nursing information obtained from:  Patient Demographic factors:  Male, Age 68 or older Current Mental Status:  NA Loss Factors:  NA Historical Factors:  Impulsivity Risk Reduction Factors:  Sense of responsibility to family, Living with another person, especially a relative, Positive social support, Positive therapeutic relationship  Total Time spent with patient: 30 minutes Principal Problem: MDD (major depressive disorder), recurrent severe, without psychosis (HCC) Diagnosis:  Principal Problem:   MDD (major depressive disorder), recurrent severe, without psychosis (HCC)  Subjective Data: Wesley [name] is a 68 year old male presenting to [facility] accompanied by his wife. Pt states that he is struggling with cocaine use for roughly 10 years. Pt states he is using cocaine once per month. Pt states he has been having suicidal thoughts for the past few days. Pt denies having a plan to end his life. Patient is admitted to [facility] unit with Q15 min safety monitoring. Multidisciplinary team approach is offered. Medication management; group/milieu therapy is offered.   Continued Clinical Symptoms:    The Alcohol  Use Disorders Identification Test, Guidelines for Use in Primary Care, Second Edition.  World Science Writer [facility]). Score between 0-7:  no or low risk or alcohol  related problems. Score between 8-15:  moderate risk of alcohol  related problems. Score between 16-19:  high risk of alcohol  related problems. Score 20 or above:  warrants further diagnostic evaluation for alcohol  dependence and treatment.   CLINICAL FACTORS:   Depression:   Impulsivity   Musculoskeletal: Strength & Muscle Tone: within normal limits Gait & Station: normal Patient leans: N/A  Psychiatric Specialty Exam:  Presentation  General Appearance:  Appropriate for Environment; Casual  Eye Contact: Fair  Speech: Clear and Coherent  Speech  Volume: Normal  Handedness: Right   Mood and Affect  Mood: Depressed; Dysphoric  Affect: Depressed   Thought Process  Thought Processes: Coherent  Descriptions of Associations:Intact  Orientation:Full (Time, Place and Person)  Thought Content:Logical  History of Schizophrenia/Schizoaffective disorder:No  Duration of Psychotic Symptoms:No data recorded Hallucinations:Hallucinations: None  Ideas of Reference:None  Suicidal Thoughts:Suicidal Thoughts: Yes, Passive  Homicidal Thoughts:Homicidal Thoughts: No   Sensorium  Memory: Immediate Fair; Recent Fair; Remote Fair  Judgment: Impaired  Insight: Shallow   Executive Functions  Concentration: Poor  Attention Span: Poor  Recall: [employer] of Knowledge: Fair  Language: Fair   Psychomotor Activity  Psychomotor Activity: Psychomotor Activity: Normal   Assets  Assets: Communication Skills; Desire for Improvement; Social Support   Sleep  Sleep: Sleep: Fair Number of Hours of Sleep: 6    Physical Exam: Physical Exam Vitals and nursing note reviewed.    ROS Blood pressure 115/78, pulse 78, temperature (!) 97.3 F (36.3 C), resp. rate 17, height 6' 2 (1.88 m), weight 87.1 kg, SpO2 96%. Body mass index is 24.65 kg/m.   COGNITIVE FEATURES THAT CONTRIBUTE TO RISK:  None    SUICIDE RISK:   Minimal: No identifiable suicidal ideation.  Patients presenting with no risk factors but with morbid ruminations; may be classified as minimal risk based on the severity of the depressive symptoms  PLAN OF CARE: Patient is admitted to [facility] psych unit with Q15 min safety monitoring. Multidisciplinary team approach is offered. Medication management; group/milieu therapy is offered.   I certify that inpatient services furnished can reasonably be expected to improve the patient's condition.   Allyn Foil, MD 10/20/2024, 10:54 PM

## 2024-10-20 NOTE — H&P (Addendum)
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 discussed in multi-disciplinary team meeting             -- Observation Level: q15 minute checks             -- Vital signs:  q12 hours             -- Precautions: suicide, elopement, and assault   2. Psychiatric Diagnoses and Treatment:              Effexor  75 mg 3 times daily-Will change to XR and optimize the dosage Will consider adding Abilify as a mood stabilizer and as an adjunct to MDD     -- The risks/benefits/side-effects/alternatives to this medication were discussed in detail with the patient and time was given for questions. The patient consents to medication trial.                -- Metabolic profile and EKG monitoring  obtained while on an atypical antipsychotic (BMI: Lipid Panel: HbgA1c: QTc:)              -- Encouraged patient to participate in unit milieu and in scheduled group therapies                            3. Medical Issues Being Addressed:  History of seizures   4. Discharge Planning:              -- Social work and case management to assist with discharge planning and  identification of hospital follow-up needs prior to discharge             -- Estimated LOS: 5-7 days             -- Discharge Concerns: Need to establish a safety plan; Medication compliance and effectiveness             -- Discharge Goals: Return home with outpatient referrals follow ups  Physician Treatment Plan for Primary Diagnosis: MDD (major depressive disorder), recurrent severe, without psychosis (HCC) Long Term Goal(s): Improvement in symptoms so as ready for discharge  Short Term Goals: Ability to identify changes in lifestyle to reduce recurrence of condition will improve, Ability to verbalize feelings will improve, Ability to disclose and discuss suicidal ideas, Ability to demonstrate self-control will improve, and Ability to identify and develop effective coping behaviors will improve  Physician Treatment Plan for Secondary Diagnosis: Principal Problem:   MDD (major depressive disorder), recurrent severe, without psychosis (HCC)  Long Term Goal(s): Improvement in symptoms so as ready for discharge  Short Term Goals: Ability to identify changes in lifestyle to reduce recurrence of condition will improve, Ability to verbalize feelings will improve, Ability to disclose and discuss suicidal ideas, Ability to demonstrate self-control will improve, and Ability to identify and develop effective coping behaviors will improve  I certify that inpatient services furnished can reasonably be expected to improve the patient's condition.    Ada Holness, MD 11/6/202510:56 PM

## 2024-10-21 LAB — GLUCOSE, CAPILLARY
Glucose-Capillary: 92 mg/dL (ref 70–99)
Glucose-Capillary: 122 mg/dL — ABNORMAL HIGH (ref 70–99)
Glucose-Capillary: 63 mg/dL — ABNORMAL LOW (ref 70–99)
Glucose-Capillary: 152 mg/dL — ABNORMAL HIGH (ref 70–99)
Glucose-Capillary: 89 mg/dL (ref 70–99)

## 2024-10-21 MED ORDER — INSULIN ASPART 100 UNIT/ML IJ SOLN
4.0000 [IU] | Freq: Two times a day (BID) | INTRAMUSCULAR | Status: DC
  Administered 2024-10-21 – 2024-10-27 (×9): 4 [IU] via SUBCUTANEOUS
  Filled 2024-10-21: qty 4
  Filled 2024-10-21: qty 1
  Filled 2024-10-21 (×2): qty 4

## 2024-10-21 MED ORDER — VENLAFAXINE HCL ER 75 MG PO CP24
150.0000 mg | ORAL_CAPSULE | Freq: Every day | ORAL | Status: DC
  Administered 2024-10-22 – 2024-10-27 (×6): 150 mg via ORAL
  Filled 2024-10-21 (×7): qty 2

## 2024-10-21 MED ORDER — ARIPIPRAZOLE 5 MG PO TABS
5.0000 mg | ORAL_TABLET | Freq: Every day | ORAL | Status: DC
  Administered 2024-10-22 – 2024-10-26 (×5): 5 mg via ORAL
  Filled 2024-10-21 (×5): qty 1

## 2024-10-21 MED ORDER — INSULIN GLARGINE-YFGN 100 UNIT/ML ~~LOC~~ SOLN
20.0000 [IU] | Freq: Every day | SUBCUTANEOUS | Status: DC
  Administered 2024-10-22 – 2024-10-27 (×6): 20 [IU] via SUBCUTANEOUS
  Filled 2024-10-21 (×6): qty 0.2
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
## 2024-10-21 NOTE — Plan of Care (Signed)
   Problem: Activity: Goal: Interest or engagement in leisure activities will improve Outcome: Progressing Goal: Imbalance in normal sleep/wake cycle will improve Outcome: Progressing

## 2024-10-21 NOTE — Progress Notes (Addendum)
 SI/HI/AVH: denies all  Behavior/Mood: cooperative/depressed   Interaction/Group attendance: minimal/ 1 of 3 groups   Medication/PRNs: compliant/none    Pain: denies   Other: check CBG's 4 times daily before meals and at bedtime

## 2024-10-21 NOTE — BH IP Treatment Plan (Signed)
 Interdisciplinary Treatment and Diagnostic Plan Update  10/21/2024 Time of Session: 3:41 PM Wesley [name] MRN: [id number]  Principal Diagnosis: MDD (major depressive disorder), recurrent severe, without psychosis (HCC)  Secondary Diagnoses: Principal Problem:   MDD (major depressive disorder), recurrent severe, without psychosis (HCC)   Current Medications:  Current Facility-Administered Medications  Medication Dose Route Frequency Provider Last Rate Last Admin   albuterol  (PROVENTIL ) (2.5 MG/3ML) 0.083% nebulizer solution 2.5 mg  2.5 mg Nebulization BID Dasie Ellouise LITTIE, FNP       albuterol  (VENTOLIN  HFA) 108 (90 Base) MCG/ACT inhaler 2 puff  2 puff Inhalation Q6H PRN Dasie Ellouise LITTIE, FNP       alum & mag hydroxide-simeth (MAALOX/MYLANTA) 200-200-20 MG/5ML suspension 30 mL  30 mL Oral Q4H PRN Dasie Ellouise LITTIE, FNP       atorvastatin  (LIPITOR ) tablet 20 mg  20 mg Oral q1800 Dasie Ellouise LITTIE, FNP   20 mg at 10/20/24 1646   brinzolamide  (AZOPT ) 1 % ophthalmic suspension 1 drop  1 drop Both Eyes TID Dasie Ellouise LITTIE, FNP   1 drop at 10/21/24 1014   And   brimonidine  (ALPHAGAN ) 0.2 % ophthalmic solution 1 drop  1 drop Both Eyes TID Dasie Ellouise LITTIE, FNP   1 drop at 10/21/24 1013   budesonide -glycopyrrolate-formoterol  (BREZTRI ) 160-9-4.8 MCG/ACT inhaler 2 puff  2 puff Inhalation Daily Dasie Ellouise LITTIE, FNP   2 puff at 10/21/24 1007   carbidopa -levodopa  (SINEMET  IR) 25-100 MG per tablet immediate release 1 tablet  1 tablet Oral TID WC Dasie Ellouise LITTIE, FNP   1 tablet at 10/21/24 1219   cetirizine  (ZYRTEC ) tablet 10 mg  10 mg Oral QHS Dasie Ellouise LITTIE, FNP   10 mg at 10/20/24 2133   cholecalciferol  (VITAMIN D3) tablet 4,000 Units  4,000 Units Oral Daily Dasie Ellouise LITTIE, FNP   4,000 Units at 10/21/24 1009   cyanocobalamin  (VITAMIN B12) tablet 500 mcg  500 mcg Oral Daily Dasie Ellouise LITTIE, FNP   500 mcg at 10/21/24 1009   docusate sodium  (COLACE) capsule 100 mg  100 mg Oral BID Dasie Ellouise LITTIE, FNP   100 mg at 10/21/24 1009    finasteride  (PROSCAR ) tablet 5 mg  5 mg Oral Daily Dasie Ellouise LITTIE, FNP   5 mg at 10/21/24 1010   ibuprofen  (ADVIL ) tablet 400 mg  400 mg Oral Q6H PRN Jadapalle, Sree, MD   400 mg at 10/20/24 1316   insulin  aspart (novoLOG ) injection 0-5 Units  0-5 Units Subcutaneous QHS Jadapalle, Sree, MD       insulin  aspart (novoLOG ) injection 0-9 Units  0-9 Units Subcutaneous TID WC Jadapalle, Sree, MD       insulin  aspart (novoLOG ) injection 4 Units  4 Units Subcutaneous BID AC Jadapalle, Sree, MD       [START ON 10/22/2024] insulin  glargine-yfgn (SEMGLEE ) injection 20 Units  20 Units Subcutaneous Daily Jadapalle, Sree, MD       latanoprost  (XALATAN ) 0.005 % ophthalmic solution 1 drop  1 drop Both Eyes QHS Dasie Ellouise LITTIE, FNP   1 drop at 10/20/24 2133   levETIRAcetam  (KEPPRA ) tablet 1,000 mg  1,000 mg Oral BID Dasie Ellouise LITTIE, FNP   1,000 mg at 10/21/24 1010   losartan (COZAAR) tablet 25 mg  25 mg Oral Daily Onuoha, Chinwendu V, NP   25 mg at 10/21/24 1010   magnesium  hydroxide (MILK OF MAGNESIA) suspension 30 mL  30 mL Oral Daily PRN Dasie Ellouise LITTIE, FNP  magnesium  oxide (MAG-OX) tablet 400 mg  400 mg Oral Daily Dasie Ellouise CROME, FNP   400 mg at 10/21/24 1009   metFORMIN  (GLUCOPHAGE ) tablet 1,000 mg  1,000 mg Oral BID WC Dasie Ellouise CROME, FNP   1,000 mg at 10/21/24 0801   mirabegron  ER (MYRBETRIQ ) tablet 50 mg  50 mg Oral Daily PRN Allen, Tina L, FNP       mirtazapine  (REMERON ) tablet 15 mg  15 mg Oral QHS Allen, Tina L, FNP   15 mg at 10/20/24 2133   OLANZapine (ZYPREXA) injection 5 mg  5 mg Intramuscular TID PRN Dasie Ellouise CROME, FNP       OLANZapine zydis (ZYPREXA) disintegrating tablet 5 mg  5 mg Oral TID PRN Allen, Tina L, FNP       pilocarpine (PILOCAR) 1 % ophthalmic solution 1 drop  1 drop Both Eyes BID Dasie Ellouise CROME, FNP   1 drop at 10/21/24 1012   tamsulosin  (FLOMAX ) capsule 0.4 mg  0.4 mg Oral QPC supper Dasie Ellouise CROME, FNP   0.4 mg at 10/20/24 1647   venlafaxine  (EFFEXOR ) tablet 75 mg  75 mg Oral TID WC  Dasie Ellouise CROME, FNP   75 mg at 10/21/24 1219   PTA Medications: Medications Prior to Admission  Medication Sig Dispense Refill Last Dose/Taking   acetaminophen  (TYLENOL ) 500 MG tablet Take 1,000 mg by mouth every 6 (six) hours as needed (For headache or pain).      albuterol  (PROVENTIL ) (2.5 MG/3ML) 0.083% nebulizer solution Take 2.5 mg by nebulization 2 (two) times daily.      albuterol  (VENTOLIN  HFA) 108 (90 Base) MCG/ACT inhaler Inhale 2 puffs into the lungs every 6 (six) hours as needed for wheezing or shortness of breath. 1 each 2    atorvastatin  (LIPITOR ) 20 MG tablet Take 20 mg by mouth daily at 6 PM.      Brinzolamide -Brimonidine  1-0.2 % SUSP Place 1 drop into both eyes in the morning, at noon, and at bedtime.      budeson-glycopyrrolate-formoterol  (BREZTRI  AEROSPHERE) 160-9-4.8 MCG/ACT AERO inhaler Inhale 2 puffs into the lungs in the morning and at bedtime. 1 each 12    carbidopa -levodopa  (SINEMET  IR) 25-100 MG tablet Take 1 tablet by mouth 3 (three) times daily with meals.      cetirizine  (ZYRTEC ) 10 MG tablet Take 1 tablet (10 mg total) by mouth daily. (Patient taking differently: Take 10 mg by mouth at bedtime.) 30 tablet 1    Cholecalciferol  (VITAMIN D3) 50 MCG (2000 UT) TABS Take 4,000 Units by mouth daily.      docusate sodium  (COLACE) 100 MG capsule Take 100 mg by mouth 2 (two) times daily.      finasteride  (PROSCAR ) 5 MG tablet Take 5 mg by mouth daily.       insulin  aspart (NOVOLOG  FLEXPEN) 100 UNIT/ML FlexPen Inject 10 Units into the skin 2 (two) times daily before a meal.      insulin  glargine (LANTUS ) 100 UNIT/ML Solostar Pen Inject 30 Units into the skin daily.      lactulose (CHRONULAC) 10 GM/15ML solution Take 20 g by mouth 2 (two) times daily as needed (For constipation).      latanoprost  (XALATAN ) 0.005 % ophthalmic solution Place 1 drop into both eyes at bedtime.       levETIRAcetam  (KEPPRA ) 500 MG tablet Take 1,000 mg by mouth 2 (two) times daily.      losartan  (COZAAR) 50 MG tablet Take 25 mg by mouth daily.  magnesium  oxide (MAG-OX) 400 (240 Mg) MG tablet Take 400 mg by mouth daily.      metFORMIN  (GLUCOPHAGE ) 1000 MG tablet Take 1,000 mg by mouth 2 (two) times daily with a meal.      mirabegron  ER (MYRBETRIQ ) 50 MG TB24 tablet Take 50 mg by mouth daily as needed (For overactive bladder).      mirtazapine  (REMERON ) 15 MG tablet Take 15 mg by mouth at bedtime.      nicotine  polacrilex (COMMIT) 2 MG lozenge Take 2 mg by mouth 2 (two) times daily as needed for smoking cessation. Place lozenge in between tongue and cheek to dissolve. Do not swallow, bite or chew lozenge      pilocarpine (PILOCAR) 1 % ophthalmic solution Place 1 drop into both eyes 2 (two) times daily.      PRESCRIPTION MEDICATION Place 1 Pad into both eyes 2 (two) times daily. Eyelid Cleanser, Eye Scrub Pad      senna (SENOKOT) 8.6 MG TABS tablet Take 8.6 mg by mouth daily as needed (For constipation).      tamsulosin  (FLOMAX ) 0.4 MG CAPS capsule Take 1 capsule (0.4 mg total) by mouth daily after supper. 30 capsule 0    tirzepatide (ZEPBOUND) 5 MG/0.5ML Pen Inject 5 mg into the skin every Friday.      venlafaxine  (EFFEXOR ) 75 MG tablet Take 75 mg by mouth in the morning, at noon, and at bedtime.      vitamin B-12 (CYANOCOBALAMIN ) 500 MCG tablet Take 500 mcg by mouth daily.       Patient Stressors:    Patient Strengths:    Treatment Modalities: Medication Management, Group therapy, Case management,  1 to 1 session with clinician, Psychoeducation, Recreational therapy.   Physician Treatment Plan for Primary Diagnosis: MDD (major depressive disorder), recurrent severe, without psychosis (HCC) Long Term Goal(s): Improvement in symptoms so as ready for discharge   Short Term Goals: Ability to identify changes in lifestyle to reduce recurrence of condition will improve Ability to verbalize feelings will improve Ability to disclose and discuss suicidal ideas Ability to demonstrate  self-control will improve Ability to identify and develop effective coping behaviors will improve  Medication Management: Evaluate patient's response, side effects, and tolerance of medication regimen.  Therapeutic Interventions: 1 to 1 sessions, Unit Group sessions and Medication administration.  Evaluation of Outcomes: Not Progressing  Physician Treatment Plan for Secondary Diagnosis: Principal Problem:   MDD (major depressive disorder), recurrent severe, without psychosis (HCC)  Long Term Goal(s): Improvement in symptoms so as ready for discharge   Short Term Goals: Ability to identify changes in lifestyle to reduce recurrence of condition will improve Ability to verbalize feelings will improve Ability to disclose and discuss suicidal ideas Ability to demonstrate self-control will improve Ability to identify and develop effective coping behaviors will improve     Medication Management: Evaluate patient's response, side effects, and tolerance of medication regimen.  Therapeutic Interventions: 1 to 1 sessions, Unit Group sessions and Medication administration.  Evaluation of Outcomes: Not Progressing   RN Treatment Plan for Primary Diagnosis: MDD (major depressive disorder), recurrent severe, without psychosis (HCC) Long Term Goal(s): Knowledge of disease and therapeutic regimen to maintain health will improve  Short Term Goals: Ability to remain free from injury will improve, Ability to verbalize frustration and anger appropriately will improve, Ability to demonstrate self-control, Ability to participate in decision making will improve, Ability to verbalize feelings will improve, Ability to disclose and discuss suicidal ideas, Ability to identify and develop  effective coping behaviors will improve, and Compliance with prescribed medications will improve  Medication Management: RN will administer medications as ordered by provider, will assess and evaluate patient's response and  provide education to patient for prescribed medication. RN will report any adverse and/or side effects to prescribing provider.  Therapeutic Interventions: 1 on 1 counseling sessions, Psychoeducation, Medication administration, Evaluate responses to treatment, Monitor vital signs and CBGs as ordered, Perform/monitor CIWA, COWS, AIMS and Fall Risk screenings as ordered, Perform wound care treatments as ordered.  Evaluation of Outcomes: Not Progressing   LCSW Treatment Plan for Primary Diagnosis: MDD (major depressive disorder), recurrent severe, without psychosis (HCC) Long Term Goal(s): Safe transition to appropriate next level of care at discharge, Engage patient in therapeutic group addressing interpersonal concerns.  Short Term Goals: Engage patient in aftercare planning with referrals and resources, Increase social support, Increase ability to appropriately verbalize feelings, Increase emotional regulation, Facilitate acceptance of mental health diagnosis and concerns, Facilitate patient progression through stages of change regarding substance use diagnoses and concerns, Identify triggers associated with mental health/substance abuse issues, and Increase skills for wellness and recovery  Therapeutic Interventions: Assess for all discharge needs, 1 to 1 time with Social worker, Explore available resources and support systems, Assess for adequacy in community support network, Educate family and significant other(s) on suicide prevention, Complete Psychosocial Assessment, Interpersonal group therapy.  Evaluation of Outcomes: Not Progressing   Progress in Treatment: Attending groups: No. Participating in groups: No. Taking medication as prescribed: Yes. Toleration medication: Yes. Family/Significant other contact made: Yes, individual(s) contacted:  Adriane Guglielmo, wife, [phone]  Patient understands diagnosis: Yes. Discussing patient identified problems/goals with staff: Yes. Medical  problems stabilized or resolved: Yes. Denies suicidal/homicidal ideation: Yes. Issues/concerns per patient self-inventory: No. Other: None   New problem(s) identified: No, Describe:  None identified   New Short Term/Long Term Goal(s): detox, elimination of symptoms of psychosis, medication management for mood stabilization; elimination of SI thoughts; development of comprehensive mental wellness/sobriety plan.   Patient Goals:  Improving that so I can get better  Discharge Plan or Barriers: CSW will assist with appropriate discharge planning   Reason for Continuation of Hospitalization: Medication stabilization Withdrawal symptoms  Estimated Length of Stay: 1 to 7 days  Last 3 [place] Suicide Severity Risk Score: Flowsheet Row Admission (Current) from 10/19/2024 in [facility] Most recent reading at 10/19/2024  4:46 PM ED from 10/19/2024 in [facility] Most recent reading at 10/19/2024  1:32 PM ED from 08/19/2023 in [facility] Emergency Department at [facility] Most recent reading at 08/19/2023  8:49 PM  C-SSRS RISK CATEGORY Low Risk Low Risk No Risk    Last PHQ 2/9 Scores:    08/23/2018    2:34 PM 03/30/2017   10:10 AM 02/16/2017   10:04 AM  Depression screen PHQ 2/9  Decreased Interest 2 2 2   Down, Depressed, Hopeless 1 2 2   PHQ - 2 Score 3 4 4   Altered sleeping 1 2 1   Tired, decreased energy 3 2 2   Change in appetite 3 2 2   Feeling bad or failure about yourself  1 1 2   Trouble concentrating 1 2 2   Moving slowly or fidgety/restless 1 1 2   Suicidal thoughts 0 0  1   PHQ-9 Score 13  14  16    Difficult doing work/chores Somewhat difficult       Data saved with a previous flowsheet row definition    Scribe for Treatment Team: Laryssa Hassing D  Raynaldo SILK 10/21/2024 3:41 PM
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
## 2024-10-21 NOTE — Plan of Care (Signed)

## 2024-10-21 NOTE — Group Note (Signed)
 Date:  10/22/2024 Time:  12:40 PM  Group Topic/Focus:  Managing Feelings:   The focus of this group is to identify what feelings patients have difficulty handling and develop a plan to handle them in a healthier way upon discharge.    Participation Level:  Did Not Attend   Larrie Leita BRAVO 10/22/2024, 12:40 PM

## 2024-10-21 NOTE — Group Note (Signed)
 Physical/Occupational Therapy Group Note  Group Topic: UE Therex   Group Date: 10/21/2024 Start Time: 1305 End Time: 1330 Facilitators: Lavonda Therisa CROME, OT   Group Description: Group instructed in series of upper extremities exercises, aimed to promote strength, flexibility, range of motion and functional endurance.  Patients provided cuing for proper mechanics and proper pace of exercise; exercises adjusted as necessary for individualized patient needs.  Patient also engaged in cognitive components throughout session, working to integrate attention to task, command following, turn-taking and appropriate social interaction throughout session.  Allowed to ask questions as appropriate, and encouraged to identify specific exercises that they could complete independently outside of group sessions.   Therapeutic Goal(s): Demonstrate appropriate performance of upper extremity exercises to promote strength, flexibility, range of motion and functional endurance Identify 2-3 specific upper extremity exercises to complete as home exercise program outside of group session   Individual Participation:pt did not attend   Participation Level:    Participation Quality:    Behavior:    Speech/Thought Process:    Affect/Mood:    Insight:    Judgement:    Modes of Intervention:   Patient Response to Interventions:     Plan: Continue to engage patient in PT/OT groups 1 - 2x/week.  10/21/2024  Therisa CROME Lavonda, OT Therisa Lavonda, OTD OTR/L  10/21/24, 3:34 PM

## 2024-10-21 NOTE — Progress Notes (Signed)
 SI/HI: denies  Behavior/Mood: Calm and cooperative. Flat affect. Denies anxiety and depression  Interaction/Group: limited interaction with peers and staff  Medication/PRN: po med complaint/No PRNs given  Pain: denies  Other: CBG AC & HS. Slept 12 hours.     10/20/24 2200  Psych Admission Type (Psych Patients Only)  Admission Status Voluntary  Psychosocial Assessment  Patient Complaints None  Eye Contact Fair  Facial Expression Flat  Affect Appropriate to circumstance  Speech Soft  Interaction Minimal  Motor Activity Unsteady  Appearance/Hygiene In scrubs  Behavior Characteristics Cooperative  Mood Sad  Thought Process  Coherency WDL  Content WDL  Delusions None reported or observed  Perception Hallucinations  Hallucination Auditory  Judgment Impaired  Confusion None  Danger to Self  Current suicidal ideation? Denies

## 2024-10-21 NOTE — Group Note (Signed)
 Date:  10/21/2024 Time:  8:50 PM  Group Topic/Focus:  Wrap-Up Group:   The focus of this group is to help patients review their daily goal of treatment and discuss progress on daily workbooks.    Participation Level:  Active  Participation Quality:  Appropriate and Attentive  Affect:  Appropriate  Cognitive:  Alert  Insight: Appropriate  Engagement in Group:  Engaged  Modes of Intervention:  Activity  Additional Comments:    Sherrilyn JAYSON Redman 10/21/2024, 8:50 PM

## 2024-10-21 NOTE — Group Note (Signed)
 Recreation Therapy Group Note   Group Topic:Healthy Support Systems  Group Date: 10/21/2024 Start Time: 1400 End Time: 1450 Facilitators: Celestia Jeoffrey BRAVO, LRT, CTRS Location: Courtyard  Group Description: Emotional Check in. Patient sat and talked with LRT about how they are doing and whatever else is on their mind. LRT provided active listening, reassurance and encouragement. Pts were given the opportunity to listen to music or color mandalas while they talk.    Goal Area(s) Addressed: Patient will engage in conversation with LRT. Patient will communicate their wants, needs, or questions.  Patient will practice a new coping skill of "talking to someone".   Affect/Mood: N/A   Participation Level: Did not attend    Clinical Observations/Individualized Feedback: Patient did not attend group.   Plan: Continue to engage patient in RT group sessions 2-3x/week.   Jeoffrey BRAVO Celestia, LRT, CTRS 10/21/2024 4:51 PM

## 2024-10-21 NOTE — [facility] Suicide Risk Assessment (Signed)
 [facility] INPATIENT:  Family/Significant Other Suicide Prevention Education  Suicide Prevention Education:  Education Completed; Miranda Garber, wife, [phone]   (name of family member/significant other) has been identified by the patient as the family member/significant other with whom the patient will be residing, and identified as the person(s) who will aid the patient in the event of a mental health crisis (suicidal ideations/suicide attempt).  With written consent from the patient, the family member/significant other has been provided the following suicide prevention education, prior to the and/or following the discharge of the patient.  The suicide prevention education provided includes the following: Suicide risk factors Suicide prevention and interventions National Suicide Hotline telephone number [facility] assessment telephone number [facility] Emergency Assistance 911 [facility] and/or Residential Mobile Crisis Unit telephone number  Request made of family/significant other to: Remove weapons (e.g., guns, rifles, knives), all items previously/currently identified as safety concern.   Remove drugs/medications (over-the-counter, prescriptions, illicit drugs), all items previously/currently identified as a safety concern.  The family member/significant other verbalizes understanding of the suicide prevention education information provided.  The family member/significant other agrees to remove the items of safety concern listed above.  [name] 10/21/2024, 2:17 PM

## 2024-10-21 NOTE — [facility] Counselor (Signed)
 CSW spoke to pt's wife Rock [phone].   Pt's wife reports she would like pt to go to an in-patient rehab program and that pt was agreeable to doing so.   CSW informed pt's wife that pt has to be agreeable for CSW to send referrals and so far pt has declined substance use follow-up outside of outpatient groups and psychiatry.   Pt's wife verbalized further concern about pt's substance use disorder.   CSW provided support and understanding.   Pt's wife reports pt sees his mental health providers at [facility] and substance use provider's at [place]

## 2024-10-22 LAB — GLUCOSE, CAPILLARY
Glucose-Capillary: 90 mg/dL (ref 70–99)
Glucose-Capillary: 95 mg/dL (ref 70–99)
Glucose-Capillary: 96 mg/dL (ref 70–99)
Glucose-Capillary: 130 mg/dL — ABNORMAL HIGH (ref 70–99)

## 2024-10-22 NOTE — Plan of Care (Signed)
   Problem: Coping: Goal: Coping ability will improve Outcome: Progressing Goal: Will verbalize feelings Outcome: Progressing

## 2024-10-22 NOTE — Progress Notes (Signed)
   10/21/24 2141  Psych Admission Type (Psych Patients Only)  Admission Status Voluntary  Psychosocial Assessment  Patient Complaints None  Eye Contact Fair  Facial Expression Sad  Affect Depressed  Speech Soft  Interaction Minimal  Motor Activity Unsteady  Appearance/Hygiene In scrubs  Behavior Characteristics Cooperative  Mood Depressed  Thought Process  Coherency WDL  Content WDL  Delusions None reported or observed  Perception Hallucinations  Hallucination Auditory  Judgment Impaired  Confusion None  Danger to Self  Current suicidal ideation? Denies  Agreement Not to Harm Self Yes  Description of Agreement verbal  Danger to Others  Danger to Others None reported or observed

## 2024-10-22 NOTE — Group Note (Signed)
 [facility] LCSW Group Therapy Note   Group Date: 10/22/2024 Start Time: 1345 End Time: 1435   Type of Therapy/Topic:  Group Therapy:  Balance in Life  Participation Level:  Minimal   Description of Group:    This group will address the concept of balance and how it feels and looks when one is unbalanced. Patients will be encouraged to process areas in their lives that are out of balance, and identify reasons for remaining unbalanced. Facilitators will guide patients utilizing problem- solving interventions to address and correct the stressor making their life unbalanced. Understanding and applying boundaries will be explored and addressed for obtaining  and maintaining a balanced life. Patients will be encouraged to explore ways to assertively make their unbalanced needs known to significant others in their lives, using other group members and facilitator for support and feedback.  Therapeutic Goals: Patient will identify two or more emotions or situations they have that consume much of in their lives. Patient will identify signs/triggers that life has become out of balance:  Patient will identify two ways to set boundaries in order to achieve balance in their lives:  Patient will demonstrate ability to communicate their needs through discussion and/or role plays  Summary of Patient Progress: Patient participated in group session.   Patient was present in the session. Patient was respectful of peers, and was present throughout the entire session.            Rexene LELON Mae, LCSWA

## 2024-10-22 NOTE — Group Note (Signed)
 Date:  10/22/2024 Time:  9:26 PM  Group Topic/Focus:  Wrap-Up Group:   The focus of this group is to help patients review their daily goal of treatment and discuss progress on daily workbooks.    Participation Level:  Active  Participation Quality:  Appropriate  Affect:  Appropriate  Cognitive:  Appropriate  Insight: Appropriate  Engagement in Group:  Engaged  Modes of Intervention:  Discussion  Additional Comments:    Wesley [name] 10/22/2024, 9:26 PM

## 2024-10-22 NOTE — Plan of Care (Signed)
  Problem: Education: Goal: Ability to state activities that reduce stress will improve Outcome: Progressing   Problem: Coping: Goal: Ability to identify and develop effective coping behavior will improve Outcome: Progressing   Problem: Self-Concept: Goal: Ability to identify factors that promote anxiety will improve Outcome: Progressing Goal: Level of anxiety will decrease Outcome: Progressing Goal: Ability to modify response to factors that promote anxiety will improve Outcome: Progressing   Problem: Education: Goal: Utilization of techniques to improve thought processes will improve Outcome: Progressing Goal: Knowledge of the prescribed therapeutic regimen will improve Outcome: Progressing   Problem: Activity: Goal: Interest or engagement in leisure activities will improve Outcome: Progressing Goal: Imbalance in normal sleep/wake cycle will improve Outcome: Progressing   Problem: Coping: Goal: Coping ability will improve Outcome: Progressing Goal: Will verbalize feelings Outcome: Progressing   Problem: Health Behavior/Discharge Planning: Goal: Ability to make decisions will improve Outcome: Progressing Goal: Compliance with therapeutic regimen will improve Outcome: Progressing   Problem: Role Relationship: Goal: Will demonstrate positive changes in social behaviors and relationships Outcome: Progressing   Problem: Safety: Goal: Ability to disclose and discuss suicidal ideas will improve Outcome: Progressing Goal: Ability to identify and utilize support systems that promote safety will improve Outcome: Progressing   Problem: Self-Concept: Goal: Will verbalize positive feelings about self Outcome: Progressing Goal: Level of anxiety will decrease Outcome: Progressing   Problem: Education: Goal: Ability to describe self-care measures that may prevent or decrease complications (Diabetes Survival Skills Education) will improve Outcome: Progressing Goal:  Individualized Educational Video(s) Outcome: Progressing   Problem: Coping: Goal: Ability to adjust to condition or change in health will improve Outcome: Progressing   Problem: Fluid Volume: Goal: Ability to maintain a balanced intake and output will improve Outcome: Progressing   Problem: Health Behavior/Discharge Planning: Goal: Ability to identify and utilize available resources and services will improve Outcome: Progressing Goal: Ability to manage health-related needs will improve Outcome: Progressing   Problem: Metabolic: Goal: Ability to maintain appropriate glucose levels will improve Outcome: Progressing   Problem: Nutritional: Goal: Maintenance of adequate nutrition will improve Outcome: Progressing Goal: Progress toward achieving an optimal weight will improve Outcome: Progressing   Problem: Skin Integrity: Goal: Risk for impaired skin integrity will decrease Outcome: Progressing   Problem: Tissue Perfusion: Goal: Adequacy of tissue perfusion will improve Outcome: Progressing

## 2024-10-22 NOTE — Progress Notes (Signed)
 SI/HI/AVH: denies all   Behavior/Mood: cooperative/depressed    Interaction/Group attendance: minimal/ 2 of 2 groups   Medication/PRNs: compliant/none    Pain: denies    Other: check CBG's 4 times daily before meals and at bedtime

## 2024-10-22 NOTE — Group Note (Signed)
 Date:  10/22/2024 Time:  12:46 PM  Group Topic/Focus:  Recovery Goals:   The focus of this group is to identify appropriate goals for recovery and establish a plan to achieve them.    Participation Level:  Minimal  Participation Quality:  Appropriate  Affect:  Appropriate  Cognitive:  Alert  Insight: Good  Engagement in Group:  Limited  Modes of Intervention:  Discussion and Education  Additional Comments:     Maglione,Ruchel Brandenburger E 10/22/2024, 12:46 PM
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
## 2024-10-23 LAB — GLUCOSE, CAPILLARY
Glucose-Capillary: 113 mg/dL — ABNORMAL HIGH (ref 70–99)
Glucose-Capillary: 83 mg/dL (ref 70–99)
Glucose-Capillary: 184 mg/dL — ABNORMAL HIGH (ref 70–99)
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
## 2024-10-23 NOTE — Group Note (Signed)
 Date:  10/23/2024 Time:  12:24 PM  Group Topic/Focus:  Wellness Toolbox:   The focus of this group is to discuss various aspects of wellness, balancing those aspects and exploring ways to increase the ability to experience wellness.  Patients will create a wellness toolbox for use upon discharge.    Participation Level:  Minimal  Participation Quality:  Appropriate and Attentive  Affect:  Anxious  Cognitive:  Appropriate  Insight: Appropriate  Engagement in Group:  Limited  Modes of Intervention:  Discussion  Additional Comments:     Maglione,Can Lucci E 10/23/2024, 12:24 PM

## 2024-10-23 NOTE — Group Note (Signed)
 Date:  10/23/2024 Time:  9:14 PM  Group Topic/Focus:  Wrap-Up Group:   The focus of this group is to help patients review their daily goal of treatment and discuss progress on daily workbooks.    Participation Level:  Active  Participation Quality:  Appropriate  Affect:  Appropriate  Cognitive:  Appropriate  Insight: Appropriate  Engagement in Group:  Engaged  Modes of Intervention:  Discussion, Education, and Support  Additional Comments:    Wesley [name] 10/23/2024, 9:14 PM

## 2024-10-23 NOTE — Progress Notes (Signed)
 Patient is a voluntary admission to [facility] for mdd with substance use. Patient is cooperative with treatment.  Participates in group and meals. Denies SI, HI, AVH, anxiety and depression. Is diabetic and gets cbg's with meals and night. Interacts with staff minimally and has a flat affect.  Will continue to monitor.

## 2024-10-23 NOTE — Progress Notes (Signed)
   10/22/24 2100  Psych Admission Type (Psych Patients Only)  Admission Status Voluntary  Psychosocial Assessment  Patient Complaints None  Eye Contact Fair  Facial Expression Sad  Affect Depressed  Speech Soft  Interaction Minimal  Motor Activity Unsteady  Appearance/Hygiene In scrubs  Behavior Characteristics Cooperative  Mood Depressed  Thought Process  Coherency WDL  Content WDL  Delusions None reported or observed  Perception WDL  Hallucination None reported or observed  Judgment Impaired  Confusion None  Danger to Self  Current suicidal ideation? Denies  Agreement Not to Harm Self Yes  Description of Agreement verbal  Danger to Others  Danger to Others None reported or observed

## 2024-10-23 NOTE — Plan of Care (Signed)
  Problem: Education: Goal: Ability to state activities that reduce stress will improve Outcome: Progressing   Problem: Coping: Goal: Ability to identify and develop effective coping behavior will improve Outcome: Progressing   Problem: Self-Concept: Goal: Ability to identify factors that promote anxiety will improve Outcome: Progressing Goal: Level of anxiety will decrease Outcome: Progressing Goal: Ability to modify response to factors that promote anxiety will improve Outcome: Progressing   Problem: Education: Goal: Utilization of techniques to improve thought processes will improve Outcome: Progressing Goal: Knowledge of the prescribed therapeutic regimen will improve Outcome: Progressing   Problem: Activity: Goal: Interest or engagement in leisure activities will improve Outcome: Progressing Goal: Imbalance in normal sleep/wake cycle will improve Outcome: Progressing   Problem: Coping: Goal: Coping ability will improve Outcome: Progressing Goal: Will verbalize feelings Outcome: Progressing   Problem: Health Behavior/Discharge Planning: Goal: Ability to make decisions will improve Outcome: Progressing Goal: Compliance with therapeutic regimen will improve Outcome: Progressing   Problem: Role Relationship: Goal: Will demonstrate positive changes in social behaviors and relationships Outcome: Progressing   Problem: Safety: Goal: Ability to disclose and discuss suicidal ideas will improve Outcome: Progressing Goal: Ability to identify and utilize support systems that promote safety will improve Outcome: Progressing   Problem: Self-Concept: Goal: Will verbalize positive feelings about self Outcome: Progressing Goal: Level of anxiety will decrease Outcome: Progressing   Problem: Education: Goal: Ability to describe self-care measures that may prevent or decrease complications (Diabetes Survival Skills Education) will improve Outcome: Progressing Goal:  Individualized Educational Video(s) Outcome: Progressing   Problem: Coping: Goal: Ability to adjust to condition or change in health will improve Outcome: Progressing   Problem: Fluid Volume: Goal: Ability to maintain a balanced intake and output will improve Outcome: Progressing   Problem: Health Behavior/Discharge Planning: Goal: Ability to identify and utilize available resources and services will improve Outcome: Progressing Goal: Ability to manage health-related needs will improve Outcome: Progressing   Problem: Metabolic: Goal: Ability to maintain appropriate glucose levels will improve Outcome: Progressing   Problem: Nutritional: Goal: Maintenance of adequate nutrition will improve Outcome: Progressing Goal: Progress toward achieving an optimal weight will improve Outcome: Progressing   Problem: Skin Integrity: Goal: Risk for impaired skin integrity will decrease Outcome: Progressing   Problem: Tissue Perfusion: Goal: Adequacy of tissue perfusion will improve Outcome: Progressing

## 2024-10-24 LAB — GLUCOSE, CAPILLARY
Glucose-Capillary: 106 mg/dL — ABNORMAL HIGH (ref 70–99)
Glucose-Capillary: 100 mg/dL — ABNORMAL HIGH (ref 70–99)
Glucose-Capillary: 134 mg/dL — ABNORMAL HIGH (ref 70–99)
Glucose-Capillary: 139 mg/dL — ABNORMAL HIGH (ref 70–99)

## 2024-10-24 NOTE — Group Note (Signed)
 Date:  10/24/2024 Time:  10:56 AM  Group Topic/Focus:  Goals Group/Meditation Group:   The focus of this group is to help patients establish daily goals to achieve during treatment and discuss how the patient can incorporate goal setting into their daily lives to aide in recovery. Also patients listened to a 10 minute meditation video and reflected to themselves.     Participation Level:  Active  Participation Quality:  Appropriate  Affect:  Appropriate  Cognitive:  Appropriate  Insight: Appropriate  Engagement in Group:  Engaged  Modes of Intervention:  Activity and Discussion  Additional Comments:    Wesley [name] 10/24/2024, 10:56 AM

## 2024-10-24 NOTE — Group Note (Signed)
 Recreation Therapy Group Note   Group Topic:Emotion Expression  Group Date: 10/24/2024 Start Time: 1500 End Time: 1605 Facilitators: Celestia Jeoffrey BRAVO, LRT, CTRS Location: Dayroom  Group Description: Positivity Collage. LRT and patients discussed the importance of having a positive mindset and being happy. Patients received magazines, safety scissors, a glue stick and a piece of paper. Pts were encouraged to find images or words in the magazines that showed "happiness" or positivity to them. Pt shared their collage with the group once they were finished. LRT and pts discussed how it can be difficult to always have a positive mindset, especially when they have mental health challenges.   Goal Area(s) Addressed:  Pt will identify things associate with positivity. Pt will reduce negative thinking. Pt will identify a new coping skill of thinking positive thoughts.    Affect/Mood: Appropriate   Participation Level: Non-verbal    Clinical Observations/Individualized Feedback: Cledith was present in the dayroom at the time of group. Pt did not sit at the table with peers and LRT. Pt did not complete a collage.   Plan: Continue to engage patient in RT group sessions 2-3x/week.   Jeoffrey BRAVO Celestia, LRT, CTRS 10/24/2024 5:08 PM

## 2024-10-24 NOTE — Progress Notes (Signed)
   10/23/24 2300  Psych Admission Type (Psych Patients Only)  Admission Status Voluntary  Psychosocial Assessment  Patient Complaints Anxiety  Eye Contact Brief  Facial Expression Sad  Affect Depressed  Speech Soft  Interaction Minimal  Motor Activity Unsteady  Appearance/Hygiene In scrubs  Behavior Characteristics Cooperative  Mood Depressed  Thought Process  Coherency WDL  Content WDL  Delusions None reported or observed  Perception WDL  Hallucination None reported or observed  Judgment Impaired  Confusion None  Danger to Self  Current suicidal ideation? Denies  Agreement Not to Harm Self Yes  Description of Agreement Verbal  Danger to Others  Danger to Others None reported or observed
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
## 2024-10-24 NOTE — Progress Notes (Signed)
   10/24/24 1300  Psych Admission Type (Psych Patients Only)  Admission Status Voluntary  Psychosocial Assessment  Patient Complaints Anxiety  Eye Contact Brief  Facial Expression Sad  Affect Depressed  Speech Soft  Interaction Minimal  Motor Activity Unsteady  Appearance/Hygiene In scrubs  Behavior Characteristics Cooperative  Mood Depressed  Thought Process  Coherency WDL  Content WDL  Delusions None reported or observed  Perception WDL  Hallucination None reported or observed  Judgment Impaired  Confusion None  Danger to Self  Current suicidal ideation? Denies  Agreement Not to Harm Self Yes  Description of Agreement verbal  Danger to Others  Danger to Others None reported or observed

## 2024-10-24 NOTE — Plan of Care (Signed)
  Problem: Self-Concept: Goal: Ability to identify factors that promote anxiety will improve Outcome: Progressing Goal: Level of anxiety will decrease Outcome: Progressing

## 2024-10-24 NOTE — Group Note (Signed)
 Physical/Occupational Therapy Group Note  Group Topic: Yoga  Group Date: 10/24/2024 Start Time: 1300 End Time: 1330 Facilitators: Jedrick Hutcherson, Alm Hamilton, PT   Group Description: Group participated with series of yoga poses, designed to emphasize functional standing balance, core stability, generalized flexibility and overall posture.  Incorporated deep breathing techniques with poses, working to promote relaxation, mindfulness and focus with targeted activities.   Discussed benefits of yoga in improving mood and self-esteem, reducing stress and anxiety, and promoting functional strength and balance for each participant.  Discussed ways to integrate into each participant's daily routine.  Provided handout with written and pictorial descriptions of included yoga movements to be utilized as appropriate outside of group time.  Therapeutic Goal(s):  Demonstrate safe ability to participate with yoga poses during group activity. Identify one benefit of participation with yoga poses as part of each participant's exercise/movement routine. Identify 1-2 individual poses that participant feels most beneficial to his/her needs and that he/she can easily replicate outside of group.  Individual Participation: Pt actively participated throughout the session modifying activities to their ability as needed.  Participation Level: Active and Engaged   Participation Quality: Minimal Cues   Behavior: Appropriate   Speech/Thought Process: This session was primarily activity based with very little discussion   Affect/Mood: Appropriate   Insight: Good   Judgement: Good   Modes of Intervention: Activity  Patient Response to Interventions:  Attentive, Engaged, and Interested    Plan: Continue to engage patient in PT/OT groups 1 - 2x/week.  CHARM Hamilton Bertin PT, DPT 10/24/24, 1:57 PM

## 2024-10-24 NOTE — Group Note (Deleted)
 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 Patients and LRT discuss what it means to be "at peace", what it feels like physically and mentally. Pts are given a canvas and watercolor paint to use and encouraged to draw their idea of a peaceful place. Pts and LRT discuss how they use this in their daily life post discharge. Pts are encouraged to take their canvas home with them as a reminder to find their peaceful place whenever they are feeling depressed, anxious, etc.    Goal Area(s) Addressed:  Patient will identify what it means to experience a "peaceful" emotion. Patient will identify a new coping skill.  Patient will express their emotions through art. Patients will increase communication by talking with LRT and peers while in group.       Affect/Mood: {RT BHH Affect/Mood:26271}   Participation Level: {RT BHH Participation Level:26267}   Participation Quality: {RT BHH Participation Quality:26268}   Behavior: {RT BHH Group Behavior:26269}   Speech/Thought Process: {RT BHH Speech/Thought:26276}   Insight: {RT BHH Insight:26272}   Judgement: {RT BHH Judgement:26278}   Modes of Intervention: {RT BHH Modes of Intervention:26277}   Patient Response to Interventions:  {RT BHH Patient Response to Intervention:26274}   Education Outcome:  {RT BHH Education Outcome:26279}   Clinical Observations/Individualized Feedback: *** was *** in their participation of session activities and group discussion. Pt identified ***   Plan: {RT BHH Tx Eojw:73719}   Jeoffrey BRAVO Celestia, LRT,  10/24/2024 4:55 PM

## 2024-10-25 LAB — GLUCOSE, CAPILLARY
Glucose-Capillary: 121 mg/dL — ABNORMAL HIGH (ref 70–99)
Glucose-Capillary: 102 mg/dL — ABNORMAL HIGH (ref 70–99)
Glucose-Capillary: 171 mg/dL — ABNORMAL HIGH (ref 70–99)
Glucose-Capillary: 150 mg/dL — ABNORMAL HIGH (ref 70–99)

## 2024-10-25 NOTE — Progress Notes (Signed)
 Patient has a sad flat affect denies SI/HI/A/VH and verbally contract for safety. Q 15 minutes safety checks ongoing. Patient interacting well with Peers and Staff compliant with medications no adverse effects noted.

## 2024-10-25 NOTE — Group Note (Signed)
 Recreation Therapy Group Note   Group Topic:Relaxation  Group Date: 10/25/2024 Start Time: 1100 End Time: 1125 Facilitators: Celestia Jeoffrey BRAVO, LRT, CTRS Location: Dayroom  Group Description: Meditation. LRT and patients discussed what they know about meditation and mindfulness. LRT played a Deep Breathing Meditation exercise script for patients to follow along to. LRT and patients discussed how meditation and deep breathing can be used as a coping skill post--discharge to help manage symptoms of stress.   Goal Area(s) Addressed: Patient will practice using relaxation technique. Patient will identify a new coping skill.  Patient will follow multistep directions to reduce anxiety and stress.   Affect/Mood: N/A   Participation Level: Did not attend    Clinical Observations/Individualized Feedback: Patient did not attend group.   Plan: Continue to engage patient in RT group sessions 2-3x/week.   Jeoffrey BRAVO Celestia, LRT, CTRS 10/25/2024 2:10 PM

## 2024-10-25 NOTE — Group Note (Signed)
 Date:  10/25/2024 Time:  11:03 AM  Group Topic/Focus:  Personal Choices and Values:   The focus of this group is to help patients assess and explore the importance of values in their lives, how their values affect their decisions, how they express their values and what opposes their expression.    Participation Level:  Active  Participation Quality:  Appropriate  Affect:  Appropriate  Cognitive:  Appropriate  Insight: Appropriate  Engagement in Group:  Engaged  Modes of Intervention:  Activity  Additional Comments:  N/A  Harlene LITTIE Gavel 10/25/2024, 11:03 AM

## 2024-10-25 NOTE — Plan of Care (Signed)
  Problem: Education: Goal: Utilization of techniques to improve thought processes will improve Outcome: Progressing Goal: Knowledge of the prescribed therapeutic regimen will improve Outcome: Progressing   Problem: Activity: Goal: Interest or engagement in leisure activities will improve Outcome: Progressing   

## 2024-10-25 NOTE — BHH Counselor (Incomplete)
 CSW contacted pt's wife, Wesley [name], [phone] with Dr.J per provider's request.   Provider gave updates on pt's clinical progress.

## 2024-10-25 NOTE — Group Note (Signed)
 Recreation Therapy Group Note   Group Topic:Leisure Education  Group Date: 10/25/2024 Start Time: 1505 End Time: 1550 Facilitators: Celestia Jeoffrey BRAVO, LRT, CTRS Location: Dayroom  Group Description: Bingo. Patients played multiple rounds of bingo. LRT and pts discussed the definition of leisure, things they do in their free time outside of the hospital, and how bingo is also a leisure activity. Pts received soda as their bingo prize.   Goal Area(s) Addressed:  Patient will identify a current leisure interest.  Patient will learn the definition of "leisure". Patient will have the opportunity to try a new leisure activity. Patient will communicate with peers and LRT.   Affect/Mood: Appropriate and Flat   Participation Level: Engaged   Participation Quality: Independent   Behavior: Calm and Cooperative   Speech/Thought Process: Coherent   Insight: Fair   Judgement: Fair    Modes of Intervention: Cooperative Play   Patient Response to Interventions:  Receptive   Education Outcome:  In group clarification offered    Clinical Observations/Individualized Feedback: Velinda was active in their participation of session activities and group discussion. 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    Plan: Continue to engage patient in RT group sessions 2-3x/week.   Jeoffrey BRAVO Celestia, LRT, CTRS 10/25/2024 5:08 PM

## 2024-10-25 NOTE — BHH Counselor (Signed)
 CSW contacted [place], [place] [phone]) to set pt up with follow-up appointments.   CSW was informed by operator that substance use providers are out today because of [facility] Day.   [facility] will [facility] to [facility]up  Lum Croft, MSW, [facility] 10/25/2024 9:27 AM

## 2024-10-25 NOTE — Group Note (Signed)
 Date:  10/25/2024 Time:  9:30 PM  Group Topic/Focus:  Wrap-Up Group:   The focus of this group is to help patients review their daily goal of treatment and discuss progress on daily workbooks.    Participation Level:  Active  Participation Quality:  Appropriate  Affect:  Appropriate  Cognitive:  Alert  Insight: Appropriate  Engagement in Group:  Engaged  Modes of Intervention:  Discussion  Additional Comments:    Tommas CHRISTELLA Bunker 10/25/2024, 9:30 PM

## 2024-10-25 NOTE — Plan of Care (Signed)
  Problem: Education: Goal: Ability to state activities that reduce stress will improve Outcome: Progressing   Problem: Coping: Goal: Ability to identify and develop effective coping behavior will improve Outcome: Progressing   Problem: Self-Concept: Goal: Ability to identify factors that promote anxiety will improve Outcome: Progressing Goal: Level of anxiety will decrease Outcome: Progressing Goal: Ability to modify response to factors that promote anxiety will improve Outcome: Progressing   Problem: Activity: Goal: Interest or engagement in leisure activities will improve Outcome: Progressing Goal: Imbalance in normal sleep/wake cycle will improve Outcome: Progressing   Problem: Self-Concept: Goal: Will verbalize positive feelings about self Outcome: Progressing Goal: Level of anxiety will decrease Outcome: Progressing   Problem: Safety: Goal: Ability to disclose and discuss suicidal ideas will improve Outcome: Progressing Goal: Ability to identify and utilize support systems that promote safety will improve Outcome: Progressing
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
## 2024-10-25 NOTE — Progress Notes (Signed)
   10/25/24 1100  Psych Admission Type (Psych Patients Only)  Admission Status Voluntary  Psychosocial Assessment  Patient Complaints None  Eye Contact Brief  Facial Expression Sad  Affect Depressed  Speech Slow;Soft  Interaction Minimal  Motor Activity Slow  Appearance/Hygiene In scrubs  Behavior Characteristics Cooperative  Mood Depressed  Thought Process  Coherency WDL  Content WDL  Delusions None reported or observed  Perception WDL  Hallucination None reported or observed  Judgment Impaired  Confusion None  Danger to Self  Current suicidal ideation? Denies  Agreement Not to Harm Self Yes  Description of Agreement verbal  Danger to Others  Danger to Others None reported or observed

## 2024-10-25 NOTE — Group Note (Unsigned)
 LCSW Group Therapy Note  Group Date: 10/25/2024 Start Time: 1330 End Time: 1400   Type of Therapy and Topic:  Group Therapy: Positive Affirmations  Participation Level:  {BHH PARTICIPATION [id number]}   Description of Group:   This group addressed positive affirmation towards self and others.  Patients went around the room and identified two positive things about themselves and two positive things about a peer in the room.  Patients reflected on how it felt to share something positive with others, to identify positive things about themselves, and to hear positive things from others/ Patients were encouraged to have a daily reflection of positive characteristics or circumstances.   Therapeutic Goals: 1. Patients will verbalize two of their positive qualities 2. Patients will demonstrate empathy for others by stating two positive qualities about a peer in the group 3. Patients will verbalize their feelings when voicing positive self affirmations and when voicing positive affirmations of others 4. Patients will discuss the potential positive impact on their wellness/recovery of focusing on positive traits of self and others.  Summary of Patient Progress:  *** actively engaged in the discussion and . S*** was able***or not able to identify positive affirmations about ***self as well as other group members. Patient demonstrated *** insight into the subject matter, was respectful of peers, participated throughout the entire session.  Therapeutic Modalities:   Cognitive Behavioral Therapy Motivational Interviewing    [name], LCSWA 10/25/2024  2:11 PM

## 2024-10-25 NOTE — BHH Counselor (Signed)
 CSW received call from pt's wife Kiril Hippe [phone]).   Rock reports that she feels pt wants to leave so he can use drugs.   Reports pt wants his social security check so that he can go use drugs.   Rock reports that pt has not called and spoken to her since pt has been in the hospital.   CSW provided support and understanding for Linda's concern about pt using drugs following discharge.   CSW reports to Rock that despite pt's care team engaging with him about substance use rehab and providing psycho education around substance use rehab, pt still declined in-patient  rehabilitation follow-up at this time.   CSW reported to [place] that pt is close to discharge and that CSW and provider will be reaching out to [place] to discuss pt's clinical progress.   Rock verbalized understanding

## 2024-10-26 LAB — GLUCOSE, CAPILLARY
Glucose-Capillary: 117 mg/dL — ABNORMAL HIGH (ref 70–99)
Glucose-Capillary: 127 mg/dL — ABNORMAL HIGH (ref 70–99)
Glucose-Capillary: 163 mg/dL — ABNORMAL HIGH (ref 70–99)
Glucose-Capillary: 154 mg/dL — ABNORMAL HIGH (ref 70–99)

## 2024-10-26 MED ORDER — ARIPIPRAZOLE 2 MG PO TABS
2.0000 mg | ORAL_TABLET | Freq: Every day | ORAL | Status: DC
  Administered 2024-10-27: 2 mg via ORAL
  Filled 2024-10-26: qty 1

## 2024-10-26 NOTE — Plan of Care (Signed)
  Problem: Education: Goal: Ability to state activities that reduce stress will improve Outcome: Progressing   Problem: Coping: Goal: Ability to identify and develop effective coping behavior will improve Outcome: Progressing   Problem: Self-Concept: Goal: Ability to identify factors that promote anxiety will improve Outcome: Progressing Goal: Level of anxiety will decrease Outcome: Progressing Goal: Ability to modify response to factors that promote anxiety will improve Outcome: Progressing   Problem: Education: Goal: Utilization of techniques to improve thought processes will improve Outcome: Progressing Goal: Knowledge of the prescribed therapeutic regimen will improve Outcome: Progressing   Problem: Activity: Goal: Interest or engagement in leisure activities will improve Outcome: Progressing Goal: Imbalance in normal sleep/wake cycle will improve Outcome: Progressing   Problem: Coping: Goal: Coping ability will improve Outcome: Progressing Goal: Will verbalize feelings Outcome: Progressing   Problem: Health Behavior/Discharge Planning: Goal: Ability to make decisions will improve Outcome: Progressing Goal: Compliance with therapeutic regimen will improve Outcome: Progressing   Problem: Role Relationship: Goal: Will demonstrate positive changes in social behaviors and relationships Outcome: Progressing   Problem: Safety: Goal: Ability to disclose and discuss suicidal ideas will improve Outcome: Progressing Goal: Ability to identify and utilize support systems that promote safety will improve Outcome: Progressing   Problem: Self-Concept: Goal: Will verbalize positive feelings about self Outcome: Progressing Goal: Level of anxiety will decrease Outcome: Progressing   Problem: Education: Goal: Ability to describe self-care measures that may prevent or decrease complications (Diabetes Survival Skills Education) will improve Outcome: Progressing Goal:  Individualized Educational Video(s) Outcome: Progressing   Problem: Coping: Goal: Ability to adjust to condition or change in health will improve Outcome: Progressing   Problem: Fluid Volume: Goal: Ability to maintain a balanced intake and output will improve Outcome: Progressing   Problem: Health Behavior/Discharge Planning: Goal: Ability to identify and utilize available resources and services will improve Outcome: Progressing Goal: Ability to manage health-related needs will improve Outcome: Progressing   Problem: Metabolic: Goal: Ability to maintain appropriate glucose levels will improve Outcome: Progressing   Problem: Nutritional: Goal: Maintenance of adequate nutrition will improve Outcome: Progressing Goal: Progress toward achieving an optimal weight will improve Outcome: Progressing   Problem: Skin Integrity: Goal: Risk for impaired skin integrity will decrease Outcome: Progressing   Problem: Tissue Perfusion: Goal: Adequacy of tissue perfusion will improve Outcome: Progressing

## 2024-10-26 NOTE — Progress Notes (Signed)
   10/25/24 2100  Psych Admission Type (Psych Patients Only)  Admission Status Voluntary  Psychosocial Assessment  Patient Complaints None  Eye Contact Brief  Facial Expression Sad  Affect Depressed  Speech Slow;Soft  Interaction Minimal  Motor Activity Slow  Appearance/Hygiene In scrubs  Behavior Characteristics Cooperative  Mood Depressed  Thought Process  Coherency WDL  Content WDL  Delusions None reported or observed  Perception WDL  Hallucination None reported or observed  Judgment Impaired  Confusion None  Danger to Self  Current suicidal ideation? Denies  Agreement Not to Harm Self Yes  Description of Agreement verbal  Danger to Others  Danger to Others None reported or observed

## 2024-10-26 NOTE — Plan of Care (Signed)
  Problem: Self-Concept: Goal: Ability to identify factors that promote anxiety will improve Outcome: Progressing Goal: Level of anxiety will decrease Outcome: Progressing Goal: Ability to modify response to factors that promote anxiety will improve Outcome: Progressing   

## 2024-10-26 NOTE — BHH Counselor (Signed)
 CSW received return phone call from Rock Bright [phone]).   CSW confirmed pt's discharge plan with Rock. Rock agreeable to pt's discharge.   CSW informed care team.   Lum Croft, MSW, [facility] 10/26/2024 3:23 PM
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
## 2024-10-26 NOTE — BHH Counselor (Signed)
 CSW contacted pt's wife, Yuta Cipollone, [phone] with Dr.J per provider's request to discuss discharge.   CSW unable to reach, left HIPAA compliant VM requesting return call.   Lum Croft, MSW, [place] 10/26/2024 3:10 PM

## 2024-10-26 NOTE — BHH Counselor (Signed)
 CSW received return call from [place] at [place], [place] [phone]. Ext [phone])  Delon reports [facility] any [facility] except for [place].   She reports pt can participate in groups. CSW asked if he could be scheduled for groups, Delon reports when he gets out pt has to call and have a SUD assessment and then he will be scheduled for groups that way.   CSW will inform provider.   Lum Croft, MSW, [place] 10/26/2024 3:21 PM

## 2024-10-26 NOTE — BHH Counselor (Signed)
 CSW contacted [place] [phone]) to get pt set up with SAIOP.  CSW was informed that the SUD nurses name is Delon.   CSW left phone number and operator reports that Delon will give CSW a return call.   CSW awaits return call at this time.   Lum Croft, MSW, [place] 10/26/2024 2:27 PM

## 2024-10-26 NOTE — Progress Notes (Signed)
   10/26/24 1100  Psych Admission Type (Psych Patients Only)  Admission Status Voluntary  Psychosocial Assessment  Patient Complaints None  Eye Contact Brief  Facial Expression Sad  Affect Depressed  Speech Slow  Interaction Minimal  Motor Activity Slow  Appearance/Hygiene In scrubs  Behavior Characteristics Cooperative  Mood Depressed  Thought Process  Coherency WDL  Content WDL  Delusions None reported or observed  Perception WDL  Hallucination None reported or observed  Judgment Impaired  Confusion None  Danger to Self  Current suicidal ideation? Denies  Agreement Not to Harm Self Yes  Description of Agreement verbal  Danger to Others  Danger to Others None reported or observed

## 2024-10-26 NOTE — Group Note (Signed)
 Date:  10/26/2024 Time:  11:37 AM  Group Topic/Focus:  Crafts, Word Search    Participation Level:  Active  Participation Quality:  Appropriate  Affect:  Appropriate  Cognitive:  Appropriate  Insight: Good  Engagement in Group:  Improving  Modes of Intervention:  Education  Additional Comments:  pt attended group and participated in all group activities  Endrit Gittins E [place] 10/26/2024, 11:37 AM

## 2024-10-26 NOTE — Group Note (Signed)
 Date:  10/26/2024 Time:  4:02 PM  Group Topic/Focus:  Group Exercise for Seniors     Participation Level:  Active  Participation Quality:  Appropriate  Affect:  Appropriate  Cognitive:  Appropriate  Insight: Appropriate  Engagement in Group:  Improving  Modes of Intervention:  Activity  Additional Comments:  Pt attended and participated in group exercise  Seferina Brokaw E Neidra Girvan 10/26/2024, 4:02 PM

## 2024-10-26 NOTE — Progress Notes (Signed)
   10/26/24 2200  Psych Admission Type (Psych Patients Only)  Admission Status Voluntary  Psychosocial Assessment  Patient Complaints None  Eye Contact Brief  Facial Expression Sad  Affect Depressed  Speech Slow  Interaction Minimal  Motor Activity Slow  Appearance/Hygiene In scrubs  Behavior Characteristics Cooperative  Mood Depressed  Thought Process  Coherency WDL  Content WDL  Delusions None reported or observed  Perception WDL  Hallucination None reported or observed  Judgment Impaired  Confusion None  Danger to Self  Current suicidal ideation? Denies  Agreement Not to Harm Self Yes  Description of Agreement verbal  Danger to Others  Danger to Others None reported or observed

## 2024-10-27 DIAGNOSIS — F332 Major depressive disorder, recurrent severe without psychotic features: Principal | ICD-10-CM

## 2024-10-27 LAB — GLUCOSE, CAPILLARY
Glucose-Capillary: 117 mg/dL — ABNORMAL HIGH (ref 70–99)
Glucose-Capillary: 106 mg/dL — ABNORMAL HIGH (ref 70–99)

## 2024-10-27 MED ORDER — MIRTAZAPINE 15 MG PO TABS
15.0000 mg | ORAL_TABLET | Freq: Every day | ORAL | 0 refills | Status: AC
Start: 2024-10-27 — End: ?

## 2024-10-27 MED ORDER — LEVETIRACETAM 500 MG PO TABS: 1000.0000 mg | ORAL_TABLET | Freq: Two times a day (BID) | ORAL | 0 refills | Status: AC

## 2024-10-27 MED ORDER — CARBIDOPA-LEVODOPA 25-100 MG PO TABS: 1.0000 | ORAL_TABLET | Freq: Three times a day (TID) | ORAL | 0 refills | Status: AC

## 2024-10-27 MED ORDER — ARIPIPRAZOLE 2 MG PO TABS: 2.0000 mg | ORAL_TABLET | Freq: Every day | ORAL | 0 refills | Status: AC

## 2024-10-27 MED ORDER — VENLAFAXINE HCL ER 150 MG PO CP24: 150.0000 mg | ORAL_CAPSULE | Freq: Every day | ORAL | 0 refills | Status: AC

## 2024-10-27 MED ORDER — FINASTERIDE 5 MG PO TABS: 5.0000 mg | ORAL_TABLET | Freq: Every day | ORAL | 0 refills | Status: AC

## 2024-10-27 MED ORDER — INSULIN GLARGINE-YFGN 100 UNIT/ML ~~LOC~~ SOLN: 20.0000 [IU] | Freq: Every day | SUBCUTANEOUS | 11 refills | Status: AC

## 2024-10-27 MED ORDER — INSULIN ASPART 100 UNIT/ML IJ SOLN: 4.0000 [IU] | Freq: Two times a day (BID) | INTRAMUSCULAR | 11 refills | Status: AC

## 2024-10-27 NOTE — Plan of Care (Signed)
  Problem: Education: Goal: Ability to state activities that reduce stress will improve Outcome: Adequate for Discharge   Problem: Coping: Goal: Ability to identify and develop effective coping behavior will improve Outcome: Adequate for Discharge   Problem: Self-Concept: Goal: Ability to identify factors that promote anxiety will improve Outcome: Adequate for Discharge Goal: Level of anxiety will decrease Outcome: Adequate for Discharge Goal: Ability to modify response to factors that promote anxiety will improve Outcome: Adequate for Discharge   Problem: Activity: Goal: Interest or engagement in leisure activities will improve Outcome: Adequate for Discharge Goal: Imbalance in normal sleep/wake cycle will improve Outcome: Adequate for Discharge   Problem: Coping: Goal: Coping ability will improve Outcome: Adequate for Discharge Goal: Will verbalize feelings Outcome: Adequate for Discharge   Problem: Safety: Goal: Ability to disclose and discuss suicidal ideas will improve Outcome: Adequate for Discharge Goal: Ability to identify and utilize support systems that promote safety will improve Outcome: Adequate for Discharge

## 2024-10-27 NOTE — [facility] Suicide Risk Assessment (Signed)
 [facility] Discharge Suicide Risk Assessment   Principal Problem: MDD (major depressive disorder), recurrent severe, without psychosis (HCC) Discharge Diagnoses: Principal Problem:   MDD (major depressive disorder), recurrent severe, without psychosis (HCC)   Total Time spent with patient: 30 minutes  Musculoskeletal: Strength & Muscle Tone: within normal limits Gait & Station: unsteady Patient leans: N/A  Psychiatric Specialty Exam  Presentation  General Appearance:  Appropriate for Environment; Casual  Eye Contact: Fair  Speech: Clear and Coherent  Speech Volume: Normal  Handedness: Right   Mood and Affect  Mood: Euthymic  Duration of Depression Symptoms: Greater than two weeks  Affect: Appropriate   Thought Process  Thought Processes: Coherent  Descriptions of Associations:Intact  Orientation:Partial  Thought Content:Logical  History of Schizophrenia/Schizoaffective disorder:No  Duration of Psychotic Symptoms:No data recorded Hallucinations:Hallucinations: None  Ideas of Reference:None  Suicidal Thoughts:Suicidal Thoughts: No  Homicidal Thoughts:Homicidal Thoughts: No   Sensorium  Memory: Immediate Fair; Recent Fair  Judgment: Fair  Insight: Fair   Art Therapist  Concentration: Fair  Attention Span: Fair  Recall: [employer] of Knowledge: Fair  Language: Fair   Psychomotor Activity  Psychomotor Activity: Psychomotor Activity: Normal   Assets  Assets: Communication Skills; Desire for Improvement   Sleep  Sleep: Sleep: Fair  Estimated Sleeping Duration (Last 24 Hours): 8.50-9.75 hours (Due to Daylight Saving Time, the durations displayed may not accurately represent documentation during the time change interval)  Physical Exam: Physical Exam Vitals and nursing note reviewed.    ROS Blood pressure (!) 131/91, pulse 64, temperature (!) 97.3 F (36.3 C), resp. rate 18, height 6' 2 (1.88 m), weight 87.1  kg, SpO2 97%. Body mass index is 24.65 kg/m.  Mental Status Per Nursing Assessment::   On Admission:  NA  Demographic Factors:  Male  Loss Factors: Decrease in vocational status  Historical Factors: NA  Risk Reduction Factors:   Living with another person, especially a relative, Positive social support, Positive therapeutic relationship, and Positive coping skills or problem solving skills  Continued Clinical Symptoms:  Depression:   Comorbid alcohol  abuse/dependence  Cognitive Features That Contribute To Risk:  None    Suicide Risk:  Minimal: No identifiable suicidal ideation.  Patients presenting with no risk factors but with morbid ruminations; may be classified as minimal risk based on the severity of the depressive symptoms   Follow-up Information     [facility]. Call on 11/01/2024.   Specialty: General Practice Why: Your psychiatric hospital follow-up appointment with Dr.Simmons is at 10:30 AM. This will be a VIRTUAL visit so you will receive a call to your phone. Contact information: [address] [place] [address] [phone]                 Plan Of Care/Follow-up recommendations:  Activity:  as tolerated  Allyn Foil, MD 10/27/2024, 9:27 AM
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
## 2024-10-27 NOTE — Progress Notes (Signed)
  [facility] Adult Case Management Discharge Plan :  Will you be returning to the same living situation after discharge:  Yes,  pt will return home  At discharge, do you have transportation home?: Yes,  pt's wife will pick him up Do you have the ability to pay for your medications: Yes,  [employer] [facility] [employer] [employer]  Release of information consent forms completed and in the chart;  Patient's signature needed at discharge.  Patient to Follow up at:  Follow-up Information     Center, [facility]. Call on 11/01/2024.   Specialty: General Practice Why: Your psychiatric hospital follow-up appointment with Dr.Simmons is at 10:30 AM. This will be a VIRTUAL visit so you will receive a call to your phone. Contact information: [address] [place] [address] [phone]                 Next level of care provider has access to [facility] Link:no  Safety Planning and Suicide Prevention discussed: Yes,  Aharon Carriere, wife, [phone]     Has patient been referred to the Quitline?: Patient refused referral for treatment  Patient has been referred for addiction treatment: Patient refused referral for treatment.  [address], LCSWA 10/27/2024, 9:11 AM

## 2024-10-27 NOTE — Progress Notes (Signed)
 Patient was discharged from [facility] unit escorted by staff. Patient denies SI/HI/AVH. Discharge packet to include printed AVS, Suicide Risk Assessment, and Transition Record reviewed with patient. Belongings to include clothing and cane returned. Patient declined to complete a Suicide Safety Plan and patient survey.
